# Patient Record
Sex: Male | Born: 1960 | Race: White | Hispanic: No | Marital: Single | Smoking: Never smoker
Health system: Southern US, Community
[De-identification: ages and names within clinical notes are randomized; demographics above are authoritative.]

---

## 2020-09-02 ENCOUNTER — Inpatient Hospital Stay (HOSPITAL_COMMUNITY)
Admission: EM | Admit: 2020-09-02 | Discharge: 2020-10-31 | DRG: 003 | Disposition: E | Payer: HRSA Program | Attending: Critical Care Medicine | Admitting: Critical Care Medicine

## 2020-09-02 ENCOUNTER — Emergency Department (HOSPITAL_COMMUNITY): Payer: HRSA Program

## 2020-09-02 ENCOUNTER — Ambulatory Visit (HOSPITAL_COMMUNITY)
Admission: EM | Admit: 2020-09-02 | Discharge: 2020-09-02 | Disposition: A | Discharge status: Nursing Home (Includes ICF) | Payer: Self-pay

## 2020-09-02 ENCOUNTER — Encounter (HOSPITAL_COMMUNITY): Payer: Self-pay

## 2020-09-02 ENCOUNTER — Other Ambulatory Visit: Payer: Self-pay

## 2020-09-02 DIAGNOSIS — M7989 Other specified soft tissue disorders: Secondary | ICD-10-CM | POA: Diagnosis not present

## 2020-09-02 DIAGNOSIS — E039 Hypothyroidism, unspecified: Secondary | ICD-10-CM | POA: Diagnosis present

## 2020-09-02 DIAGNOSIS — J9602 Acute respiratory failure with hypercapnia: Secondary | ICD-10-CM | POA: Diagnosis not present

## 2020-09-02 DIAGNOSIS — Z931 Gastrostomy status: Secondary | ICD-10-CM

## 2020-09-02 DIAGNOSIS — Z9911 Dependence on respirator [ventilator] status: Secondary | ICD-10-CM | POA: Diagnosis not present

## 2020-09-02 DIAGNOSIS — R1110 Vomiting, unspecified: Secondary | ICD-10-CM

## 2020-09-02 DIAGNOSIS — D594 Other nonautoimmune hemolytic anemias: Secondary | ICD-10-CM | POA: Diagnosis present

## 2020-09-02 DIAGNOSIS — Z452 Encounter for adjustment and management of vascular access device: Secondary | ICD-10-CM

## 2020-09-02 DIAGNOSIS — J9601 Acute respiratory failure with hypoxia: Secondary | ICD-10-CM

## 2020-09-02 DIAGNOSIS — E8770 Fluid overload, unspecified: Secondary | ICD-10-CM | POA: Diagnosis not present

## 2020-09-02 DIAGNOSIS — E785 Hyperlipidemia, unspecified: Secondary | ICD-10-CM | POA: Diagnosis present

## 2020-09-02 DIAGNOSIS — K922 Gastrointestinal hemorrhage, unspecified: Secondary | ICD-10-CM | POA: Diagnosis not present

## 2020-09-02 DIAGNOSIS — Z9281 Personal history of extracorporeal membrane oxygenation (ECMO): Secondary | ICD-10-CM

## 2020-09-02 DIAGNOSIS — I82463 Acute embolism and thrombosis of calf muscular vein, bilateral: Secondary | ICD-10-CM | POA: Diagnosis not present

## 2020-09-02 DIAGNOSIS — R579 Shock, unspecified: Secondary | ICD-10-CM | POA: Diagnosis not present

## 2020-09-02 DIAGNOSIS — R0989 Other specified symptoms and signs involving the circulatory and respiratory systems: Secondary | ICD-10-CM | POA: Diagnosis not present

## 2020-09-02 DIAGNOSIS — Z515 Encounter for palliative care: Secondary | ICD-10-CM | POA: Diagnosis not present

## 2020-09-02 DIAGNOSIS — R609 Edema, unspecified: Secondary | ICD-10-CM

## 2020-09-02 DIAGNOSIS — T380X5A Adverse effect of glucocorticoids and synthetic analogues, initial encounter: Secondary | ICD-10-CM | POA: Diagnosis not present

## 2020-09-02 DIAGNOSIS — Z79890 Hormone replacement therapy (postmenopausal): Secondary | ICD-10-CM | POA: Diagnosis not present

## 2020-09-02 DIAGNOSIS — M6281 Muscle weakness (generalized): Secondary | ICD-10-CM | POA: Diagnosis present

## 2020-09-02 DIAGNOSIS — Y832 Surgical operation with anastomosis, bypass or graft as the cause of abnormal reaction of the patient, or of later complication, without mention of misadventure at the time of the procedure: Secondary | ICD-10-CM | POA: Diagnosis not present

## 2020-09-02 DIAGNOSIS — R4182 Altered mental status, unspecified: Secondary | ICD-10-CM | POA: Diagnosis not present

## 2020-09-02 DIAGNOSIS — K567 Ileus, unspecified: Secondary | ICD-10-CM | POA: Diagnosis not present

## 2020-09-02 DIAGNOSIS — Z791 Long term (current) use of non-steroidal anti-inflammatories (NSAID): Secondary | ICD-10-CM

## 2020-09-02 DIAGNOSIS — J0410 Acute tracheitis without obstruction: Secondary | ICD-10-CM | POA: Diagnosis not present

## 2020-09-02 DIAGNOSIS — R0689 Other abnormalities of breathing: Secondary | ICD-10-CM

## 2020-09-02 DIAGNOSIS — R410 Disorientation, unspecified: Secondary | ICD-10-CM | POA: Diagnosis not present

## 2020-09-02 DIAGNOSIS — Z66 Do not resuscitate: Secondary | ICD-10-CM | POA: Diagnosis present

## 2020-09-02 DIAGNOSIS — G9341 Metabolic encephalopathy: Secondary | ICD-10-CM | POA: Diagnosis not present

## 2020-09-02 DIAGNOSIS — I9589 Other hypotension: Secondary | ICD-10-CM | POA: Diagnosis not present

## 2020-09-02 DIAGNOSIS — Y95 Nosocomial condition: Secondary | ICD-10-CM | POA: Diagnosis present

## 2020-09-02 DIAGNOSIS — J80 Acute respiratory distress syndrome: Secondary | ICD-10-CM | POA: Diagnosis present

## 2020-09-02 DIAGNOSIS — J9690 Respiratory failure, unspecified, unspecified whether with hypoxia or hypercapnia: Secondary | ICD-10-CM

## 2020-09-02 DIAGNOSIS — J9600 Acute respiratory failure, unspecified whether with hypoxia or hypercapnia: Secondary | ICD-10-CM

## 2020-09-02 DIAGNOSIS — I82441 Acute embolism and thrombosis of right tibial vein: Secondary | ICD-10-CM | POA: Diagnosis not present

## 2020-09-02 DIAGNOSIS — E870 Hyperosmolality and hypernatremia: Secondary | ICD-10-CM | POA: Diagnosis not present

## 2020-09-02 DIAGNOSIS — R1310 Dysphagia, unspecified: Secondary | ICD-10-CM | POA: Diagnosis not present

## 2020-09-02 DIAGNOSIS — U071 COVID-19: Secondary | ICD-10-CM | POA: Diagnosis present

## 2020-09-02 DIAGNOSIS — R7401 Elevation of levels of liver transaminase levels: Secondary | ICD-10-CM | POA: Diagnosis not present

## 2020-09-02 DIAGNOSIS — Z9289 Personal history of other medical treatment: Secondary | ICD-10-CM

## 2020-09-02 DIAGNOSIS — Z7189 Other specified counseling: Secondary | ICD-10-CM

## 2020-09-02 DIAGNOSIS — D649 Anemia, unspecified: Secondary | ICD-10-CM | POA: Diagnosis not present

## 2020-09-02 DIAGNOSIS — I10 Essential (primary) hypertension: Secondary | ICD-10-CM | POA: Diagnosis present

## 2020-09-02 DIAGNOSIS — J9501 Hemorrhage from tracheostomy stoma: Secondary | ICD-10-CM | POA: Diagnosis not present

## 2020-09-02 DIAGNOSIS — E875 Hyperkalemia: Secondary | ICD-10-CM | POA: Diagnosis not present

## 2020-09-02 DIAGNOSIS — Z4659 Encounter for fitting and adjustment of other gastrointestinal appliance and device: Secondary | ICD-10-CM

## 2020-09-02 DIAGNOSIS — Z978 Presence of other specified devices: Secondary | ICD-10-CM

## 2020-09-02 DIAGNOSIS — J982 Interstitial emphysema: Secondary | ICD-10-CM | POA: Diagnosis not present

## 2020-09-02 DIAGNOSIS — E669 Obesity, unspecified: Secondary | ICD-10-CM | POA: Diagnosis present

## 2020-09-02 DIAGNOSIS — N170 Acute kidney failure with tubular necrosis: Secondary | ICD-10-CM | POA: Diagnosis not present

## 2020-09-02 DIAGNOSIS — J15212 Pneumonia due to Methicillin resistant Staphylococcus aureus: Secondary | ICD-10-CM | POA: Diagnosis not present

## 2020-09-02 DIAGNOSIS — R339 Retention of urine, unspecified: Secondary | ICD-10-CM | POA: Diagnosis not present

## 2020-09-02 DIAGNOSIS — I609 Nontraumatic subarachnoid hemorrhage, unspecified: Secondary | ICD-10-CM | POA: Diagnosis not present

## 2020-09-02 DIAGNOSIS — G9340 Encephalopathy, unspecified: Secondary | ICD-10-CM | POA: Diagnosis not present

## 2020-09-02 DIAGNOSIS — J1282 Pneumonia due to coronavirus disease 2019: Secondary | ICD-10-CM

## 2020-09-02 DIAGNOSIS — T502X5A Adverse effect of carbonic-anhydrase inhibitors, benzothiadiazides and other diuretics, initial encounter: Secondary | ICD-10-CM | POA: Diagnosis not present

## 2020-09-02 DIAGNOSIS — R042 Hemoptysis: Secondary | ICD-10-CM | POA: Diagnosis present

## 2020-09-02 DIAGNOSIS — D599 Acquired hemolytic anemia, unspecified: Secondary | ICD-10-CM | POA: Diagnosis not present

## 2020-09-02 DIAGNOSIS — K626 Ulcer of anus and rectum: Secondary | ICD-10-CM | POA: Diagnosis not present

## 2020-09-02 DIAGNOSIS — R451 Restlessness and agitation: Secondary | ICD-10-CM | POA: Diagnosis not present

## 2020-09-02 DIAGNOSIS — D689 Coagulation defect, unspecified: Secondary | ICD-10-CM | POA: Diagnosis not present

## 2020-09-02 DIAGNOSIS — I82453 Acute embolism and thrombosis of peroneal vein, bilateral: Secondary | ICD-10-CM | POA: Diagnosis not present

## 2020-09-02 DIAGNOSIS — Z789 Other specified health status: Secondary | ICD-10-CM | POA: Diagnosis not present

## 2020-09-02 DIAGNOSIS — Z6839 Body mass index (BMI) 39.0-39.9, adult: Secondary | ICD-10-CM

## 2020-09-02 DIAGNOSIS — J189 Pneumonia, unspecified organism: Secondary | ICD-10-CM | POA: Diagnosis not present

## 2020-09-02 DIAGNOSIS — Z01810 Encounter for preprocedural cardiovascular examination: Secondary | ICD-10-CM | POA: Diagnosis not present

## 2020-09-02 DIAGNOSIS — L8990 Pressure ulcer of unspecified site, unspecified stage: Secondary | ICD-10-CM | POA: Insufficient documentation

## 2020-09-02 DIAGNOSIS — R739 Hyperglycemia, unspecified: Secondary | ICD-10-CM | POA: Diagnosis not present

## 2020-09-02 DIAGNOSIS — R571 Hypovolemic shock: Secondary | ICD-10-CM | POA: Diagnosis present

## 2020-09-02 DIAGNOSIS — I428 Other cardiomyopathies: Secondary | ICD-10-CM | POA: Diagnosis not present

## 2020-09-02 DIAGNOSIS — Z7982 Long term (current) use of aspirin: Secondary | ICD-10-CM

## 2020-09-02 DIAGNOSIS — E872 Acidosis: Secondary | ICD-10-CM | POA: Diagnosis not present

## 2020-09-02 DIAGNOSIS — R0902 Hypoxemia: Secondary | ICD-10-CM

## 2020-09-02 DIAGNOSIS — E1165 Type 2 diabetes mellitus with hyperglycemia: Secondary | ICD-10-CM | POA: Diagnosis present

## 2020-09-02 DIAGNOSIS — E878 Other disorders of electrolyte and fluid balance, not elsewhere classified: Secondary | ICD-10-CM | POA: Diagnosis not present

## 2020-09-02 DIAGNOSIS — E861 Hypovolemia: Secondary | ICD-10-CM | POA: Diagnosis present

## 2020-09-02 DIAGNOSIS — E871 Hypo-osmolality and hyponatremia: Secondary | ICD-10-CM | POA: Diagnosis present

## 2020-09-02 DIAGNOSIS — R092 Respiratory arrest: Secondary | ICD-10-CM

## 2020-09-02 DIAGNOSIS — R7989 Other specified abnormal findings of blood chemistry: Secondary | ICD-10-CM | POA: Diagnosis not present

## 2020-09-02 DIAGNOSIS — Z79899 Other long term (current) drug therapy: Secondary | ICD-10-CM

## 2020-09-02 DIAGNOSIS — D6959 Other secondary thrombocytopenia: Secondary | ICD-10-CM | POA: Diagnosis present

## 2020-09-02 DIAGNOSIS — N179 Acute kidney failure, unspecified: Secondary | ICD-10-CM | POA: Diagnosis not present

## 2020-09-02 DIAGNOSIS — D696 Thrombocytopenia, unspecified: Secondary | ICD-10-CM | POA: Diagnosis not present

## 2020-09-02 LAB — COMPREHENSIVE METABOLIC PANEL
ALT: 44 U/L (ref 0–44)
AST: 68 U/L — ABNORMAL HIGH (ref 15–41)
Albumin: 3.3 g/dL — ABNORMAL LOW (ref 3.5–5.0)
Alkaline Phosphatase: 118 U/L (ref 38–126)
Anion gap: 10 (ref 5–15)
BUN: 14 mg/dL (ref 6–20)
CO2: 24 mmol/L (ref 22–32)
Calcium: 8.6 mg/dL — ABNORMAL LOW (ref 8.9–10.3)
Chloride: 99 mmol/L (ref 98–111)
Creatinine, Ser: 1.05 mg/dL (ref 0.61–1.24)
GFR, Estimated: >60 mL/min (ref >60)
Glucose, Bld: 107 mg/dL — ABNORMAL HIGH (ref 70–99)
Potassium: 4.4 mmol/L (ref 3.5–5.1)
Sodium: 133 mmol/L — ABNORMAL LOW (ref 135–145)
Total Bilirubin: 0.9 mg/dL (ref 0.3–1.2)
Total Protein: 6.5 g/dL (ref 6.5–8.1)

## 2020-09-02 LAB — CBC WITH DIFFERENTIAL/PLATELET
Abs Immature Granulocytes: 0.11 10*3/uL — ABNORMAL HIGH (ref 0.00–0.07)
Basophils Absolute: 0 10*3/uL (ref 0.0–0.1)
Basophils Relative: 0 %
Eosinophils Absolute: 0 10*3/uL (ref 0.0–0.5)
Eosinophils Relative: 0 %
HCT: 44.4 % (ref 39.0–52.0)
Hemoglobin: 15.2 g/dL (ref 13.0–17.0)
Immature Granulocytes: 1 %
Lymphocytes Relative: 16 %
Lymphs Abs: 1.3 10*3/uL (ref 0.7–4.0)
MCH: 31.3 pg (ref 26.0–34.0)
MCHC: 34.2 g/dL (ref 30.0–36.0)
MCV: 91.5 fL (ref 80.0–100.0)
Monocytes Absolute: 0.6 10*3/uL (ref 0.1–1.0)
Monocytes Relative: 7 %
Neutro Abs: 6.1 10*3/uL (ref 1.7–7.7)
Neutrophils Relative %: 76 %
Platelets: 139 10*3/uL — ABNORMAL LOW (ref 150–400)
RBC: 4.85 MIL/uL (ref 4.22–5.81)
RDW: 12.5 % (ref 11.5–15.5)
WBC: 8.1 10*3/uL (ref 4.0–10.5)
nRBC: 0 % (ref 0.0–0.2)

## 2020-09-02 LAB — RESP PANEL BY RT-PCR (FLU A&B, COVID) ARPGX2
Influenza A by PCR: NEGATIVE
Influenza B by PCR: NEGATIVE
SARS Coronavirus 2 by RT PCR: POSITIVE — AB

## 2020-09-02 LAB — C-REACTIVE PROTEIN: CRP: 8.3 mg/dL — ABNORMAL HIGH (ref <1.0)

## 2020-09-02 LAB — PROCALCITONIN: Procalcitonin: 0.18 ng/mL

## 2020-09-02 LAB — TROPONIN I (HIGH SENSITIVITY)
Troponin I (High Sensitivity): 12 ng/L (ref <18)
Troponin I (High Sensitivity): 12 ng/L (ref <18)

## 2020-09-02 LAB — LACTIC ACID, PLASMA
Lactic Acid, Venous: 1.5 mmol/L (ref 0.5–1.9)
Lactic Acid, Venous: 2.2 mmol/L (ref 0.5–1.9)

## 2020-09-02 LAB — LACTATE DEHYDROGENASE: LDH: 525 U/L — ABNORMAL HIGH (ref 98–192)

## 2020-09-02 LAB — FERRITIN: Ferritin: 902 ng/mL — ABNORMAL HIGH (ref 24–336)

## 2020-09-02 LAB — FIBRINOGEN: Fibrinogen: 556 mg/dL — ABNORMAL HIGH (ref 210–475)

## 2020-09-02 LAB — TRIGLYCERIDES: Triglycerides: 101 mg/dL (ref <150)

## 2020-09-02 LAB — D-DIMER, QUANTITATIVE: D-Dimer, Quant: 1.55 ug/mL-FEU — ABNORMAL HIGH (ref 0.00–0.50)

## 2020-09-02 MED ORDER — DEXAMETHASONE 2 MG PO TABS: 6.0000 mg | ORAL_TABLET | ORAL | Status: DC

## 2020-09-02 MED ORDER — ONDANSETRON HCL 4 MG/2ML IJ SOLN: 4.0000 mg | Freq: Four times a day (QID) | INTRAMUSCULAR | Status: DC | PRN

## 2020-09-02 MED ORDER — LACTATED RINGERS IV SOLN: INTRAVENOUS | Status: DC

## 2020-09-02 MED ORDER — ENOXAPARIN SODIUM 40 MG/0.4ML ~~LOC~~ SOLN
40.0000 mg | SUBCUTANEOUS | Status: DC
  Administered 2020-09-02 – 2020-09-04 (×3): 40 mg via SUBCUTANEOUS
  Filled 2020-09-02 (×3): qty 0.4

## 2020-09-02 MED ORDER — ONDANSETRON HCL 4 MG PO TABS: 4.0000 mg | ORAL_TABLET | Freq: Four times a day (QID) | ORAL | Status: DC | PRN

## 2020-09-02 MED ORDER — SODIUM CHLORIDE 0.9 % IV SOLN
500.0000 mg | INTRAVENOUS | Status: DC
  Administered 2020-09-03 – 2020-09-04 (×2): 500 mg via INTRAVENOUS
  Filled 2020-09-02 (×2): qty 500

## 2020-09-02 MED ORDER — SALINE SPRAY 0.65 % NA SOLN
1.0000 | NASAL | Status: DC
  Administered 2020-09-02: 2 via NASAL
  Administered 2020-09-03 (×2): 1 via NASAL
  Administered 2020-09-03: 2 via NASAL
  Administered 2020-09-03: 1 via NASAL
  Administered 2020-09-03: 2 via NASAL
  Administered 2020-09-03: 1 via NASAL
  Administered 2020-09-03 – 2020-09-04 (×6): 2 via NASAL
  Administered 2020-09-04: 1 via NASAL
  Administered 2020-09-04: 2 via NASAL
  Administered 2020-09-04 – 2020-09-05 (×2): 1 via NASAL
  Filled 2020-09-02: qty 44

## 2020-09-02 MED ORDER — SODIUM CHLORIDE 0.9 % IV SOLN
1.0000 g | INTRAVENOUS | Status: DC
  Administered 2020-09-03 – 2020-09-04 (×2): 1 g via INTRAVENOUS
  Filled 2020-09-02 (×2): qty 10

## 2020-09-02 MED ORDER — ACETAMINOPHEN 325 MG PO TABS
650.0000 mg | ORAL_TABLET | Freq: Once | ORAL | Status: AC
  Administered 2020-09-02: 650 mg via ORAL
  Filled 2020-09-02: qty 2

## 2020-09-02 MED ORDER — DEXAMETHASONE SODIUM PHOSPHATE 10 MG/ML IJ SOLN
10.0000 mg | Freq: Once | INTRAMUSCULAR | Status: AC
  Administered 2020-09-02: 10 mg via INTRAVENOUS
  Filled 2020-09-02: qty 1

## 2020-09-02 MED ORDER — SODIUM CHLORIDE 0.9 % IV SOLN
100.0000 mg | Freq: Every day | INTRAVENOUS | Status: AC
  Administered 2020-09-03 – 2020-09-06 (×4): 100 mg via INTRAVENOUS
  Filled 2020-09-02 (×6): qty 20

## 2020-09-02 MED ORDER — IOHEXOL 350 MG/ML SOLN
100.0000 mL | Freq: Once | INTRAVENOUS | Status: AC | PRN
  Administered 2020-09-02: 100 mL via INTRAVENOUS

## 2020-09-02 MED ORDER — SODIUM CHLORIDE 0.9 % IV BOLUS
1000.0000 mL | Freq: Once | INTRAVENOUS | Status: AC
  Administered 2020-09-02: 1000 mL via INTRAVENOUS

## 2020-09-02 MED ORDER — POLYETHYLENE GLYCOL 3350 17 G PO PACK: 17.0000 g | PACK | Freq: Every day | ORAL | Status: DC | PRN

## 2020-09-02 MED ORDER — ACETAMINOPHEN 325 MG PO TABS
650.0000 mg | ORAL_TABLET | Freq: Four times a day (QID) | ORAL | Status: DC | PRN
  Administered 2020-09-03: 650 mg via ORAL
  Filled 2020-09-02: qty 2

## 2020-09-02 MED ORDER — SODIUM CHLORIDE 0.9 % IV SOLN
200.0000 mg | Freq: Once | INTRAVENOUS | Status: AC
  Administered 2020-09-02: 200 mg via INTRAVENOUS
  Filled 2020-09-02: qty 40

## 2020-09-02 MED ORDER — ONDANSETRON HCL 4 MG/2ML IJ SOLN
4.0000 mg | Freq: Once | INTRAMUSCULAR | Status: AC
  Administered 2020-09-02: 4 mg via INTRAVENOUS
  Filled 2020-09-02: qty 2

## 2020-09-02 NOTE — H&P (Signed)
History and Physical    Donald Rowe MVH:[id number] DOB: 12-31-1960 DOA: 09/06/2020  PCP: Venetia Constable, MD  Patient coming from: home Chief Complaint: COVID and sob  HPI: Donald Rowe is a 59 y.o. male with a pertinent history of hypothyroidism on levothyroxine and hyperlipidemia who was diagnosed with Covid 1 week ago and is not vaccinated.  With EMS he was seen to be D satting to 78% on room air.  He states he started having symptoms on Friday and tested positive on Saturday.  He was having some sniffles earlier on in the week and just the past day or so has developed shortness of breath, associated symptoms include hemoptysis and was having some calf tenderness.  In the ED patient was increased to 6 L oxygen satting about 92%, WBC 8.1, Hgb 15.2, PLT 139, lactic acid 1.5, D-dimer 1.55, procalcitonin 0.18, ferritin 902, triglycerides 101, fibrinogen 556, CRP 8.3, troponin 12, LDH 525, NA 133, K4.4, CA 8.6, albumin 3.3, AST 68, Covid again was positive.  In the emergency department he was started on remdesivir and given dexamethasone DVT US was negative and CT scan was negative for PE but showed other findings.  Review of Systems: As per HPI otherwise 10 point review of systems negative.  Other pertinents as below: difficult to get full ROS because of his mask General - denies any headaches or visual changes, has a fever and chill HEENT - denies any new HA's or visual changes Cardio - denies any CP or palpitations Resp - feels sob, has a cough  GI - denies n/v/d/GI pain, h/m GU - denies any urinary changes MSK - denies any joint or back pain Skin -  Neuro -  Psych - he doesn't thin khe is depressed or anxious  H/o hypothyroidism History reviewed. No pertinent past medical history.  History reviewed. No pertinent surgical history.   reports that he has never smoked. He has never used smokeless tobacco. No history on file for alcohol use and drug use.  No Known  Allergies  History reviewed. No pertinent family history.   Prior to Admission medications   Medication Sig Start Date End Date Taking? Authorizing Provider  LIVALO 4 MG TABS Take 0.5 tablets by mouth daily. 07/24/20  Yes [provider]  levothyroxine (SYNTHROID) 50 MCG tablet Take 50 mcg by mouth daily. 06/30/20   [provider]  meloxicam (MOBIC) 15 MG tablet Take 15 mg by mouth daily. 06/27/20   [provider]    Physical Exam: Vitals:   09/10/2020 1300 09/03/2020 1330 08/30/2020 1400 09/14/2020 1517  BP: (!) 115/56 (!) 115/56 (!) 105/55 124/75  Pulse: (!) 110 (!) 110 (!) 102 (!) 103  Resp:  Temp:      TempSrc:      SpO2: 93% 93% 93% 92%    Constitutional: NAD, somewhat uncomfortable Eyes: pupils equal and reactive to light, anicteric, without injection ENMT: MMM, throat without exudates or erythema Neck: normal, supple, no masses, no thyromegaly noted Respiratory: coarse rales diffusely Cardiovascular: rrr w/o mrg, warm extremities Abdomen: NBS, NT,   Musculoskeletal: moving all 4 extremities,  Skin: no rashes, lesions, ulcers. No induration Neurologic: CN 2-12 grossly intact. Sensation intact Psychiatric: AO appearing, mentation appropriate  Labs on Admission: I have personally reviewed following labs and imaging studies  CBC: Recent Labs  Lab 09/01/2020 1242  WBC 8.1  NEUTROABS 6.1  HGB 15.2  HCT 44.4  MCV 91.5  PLT 139*   Basic Metabolic  Panel: Recent Labs  Lab 09/23/2020 1242  NA 133*  K 4.4  CL 99  CO2 24  GLUCOSE 107*  BUN 14  CREATININE 1.05  CALCIUM 8.6*   GFR: CrCl cannot be calculated (Unknown ideal weight.). Liver Function Tests: Recent Labs  Lab 09/18/2020 1242  AST 68*  ALT 44  ALKPHOS 118  BILITOT 0.9  PROT 6.5  ALBUMIN 3.3*   No results for input(s): LIPASE, AMYLASE in the last 168 hours. No results for input(s): AMMONIA in the last 168 hours. Coagulation Profile: No results for input(s): INR,  PROTIME in the last 168 hours. Cardiac Enzymes: No results for input(s): CKTOTAL, CKMB, CKMBINDEX, TROPONINI in the last 168 hours. BNP (last 3 results) No results for input(s): PROBNP in the last 8760 hours. HbA1C: No results for input(s): HGBA1C in the last 72 hours. CBG: No results for input(s): GLUCAP in the last 168 hours. Lipid Profile: Recent Labs    09/29/2020 1242  TRIG 101   Thyroid Function Tests: No results for input(s): TSH, T4TOTAL, FREET4, T3FREE, THYROIDAB in the last 72 hours. Anemia Panel: Recent Labs    09/17/2020 1242  FERRITIN 902*   Urine analysis: No results found for: COLORURINE, APPEARANCEUR, LABSPEC, PHURINE, GLUCOSEU, HGBUR, BILIRUBINUR, KETONESUR, PROTEINUR, UROBILINOGEN, NITRITE, LEUKOCYTESUR  Radiological Exams on Admission: CT Angio Chest PE W/Cm &/Or Wo Cm  Result Date: 08/30/2020 CLINICAL DATA:  59 year old with shortness of breath and COVID-19 positive. EXAM: CT ANGIOGRAPHY CHEST WITH CONTRAST TECHNIQUE: Multidetector CT imaging of the chest was performed using the standard protocol during bolus administration of intravenous contrast. Multiplanar CT image reconstructions and MIPs were obtained to evaluate the vascular anatomy. CONTRAST:  OMNIPAQUE IOHEXOL 350 MG/ML SOLN COMPARISON:  Chest radiograph 09/05/2020 FINDINGS: Cardiovascular: Main pulmonary arteries are patent. Unfortunately, limited evaluation of the pulmonary arteries beyond the main pulmonary arteries due to extensive motion artifact and surrounding parenchymal lung disease. Heart size is normal. No significant pericardial effusion. There are coronary artery calcifications. Mediastinum/Nodes: Small lymph nodes in the mediastinum but no significant chest lymphadenopathy. No axillary lymph node enlargement. Lungs/Pleura: Trachea and mainstem bronchi are patent. Extensive airspace disease and consolidation throughout both lungs and involving all of the lobes. Parenchymal disease is slightly  patchy. No significant pleural fluid. Upper Abdomen: Images of the upper abdomen are unremarkable. Musculoskeletal: No acute abnormality. Review of the MIP images confirms the above findings. IMPRESSION: 1. Extensive airspace disease involving both lungs. Findings are compatible with bilateral pneumonia and likely related to COVID-19. 2. Limited evaluation for pulmonary emboli due to technical limitations. No evidence for a large pulmonary embolism involving the main pulmonary arteries as described. Electronically Signed   By: Richarda Overlie M.D.   On: 09/17/2020 14:57   DG Chest Port 1 View  Result Date: 09/01/2020 CLINICAL DATA:  Low oxygen saturation. Body aches and generalized weakness with shortness of breath for 1 week. Positive COVID-19 test on 08/26/2020. EXAM: PORTABLE CHEST 1 VIEW COMPARISON:  None. FINDINGS: There are bilateral hazy airspace lung opacities, in the mid and lower lungs, consistent with multifocal COVID-19 pneumonia. No convincing pleural effusion.  No pneumothorax. Cardiac silhouette is normal in size. No mediastinal or hilar masses. Skeletal structures are grossly intact. IMPRESSION: 1. Bilateral airspace lung opacities consistent with multifocal COVID-19 pneumonia. Electronically Signed   By: Amie Portland M.D.   On: 09/08/2020 12:24   VAS [place] LOWER EXTREMITY VENOUS (DVT) (ONLY [facility] & [facility])  Result Date: 09/22/2020  Lower Venous DVT Study Indications: COVID pneumonia,  dyspnea, edema.  Comparison Study: No prior study Performing Technologist: Gertie Fey MHA, RDMS, RVT, RDCS  Examination Guidelines: A complete evaluation includes B-mode imaging, spectral Doppler, color Doppler, and power Doppler as needed of all accessible portions of each vessel. Bilateral testing is considered an integral part of a complete examination. Limited examinations for reoccurring indications may be performed as noted. The reflux portion of the exam is performed with the patient in reverse Trendelenburg.   +---------+---------------+---------+-----------+----------+--------------+ RIGHT    CompressibilityPhasicitySpontaneityPropertiesThrombus Aging +---------+---------------+---------+-----------+----------+--------------+ CFV      Full           Yes      Yes                                 +---------+---------------+---------+-----------+----------+--------------+ SFJ      Full                                                        +---------+---------------+---------+-----------+----------+--------------+ FV Prox  Full                                                        +---------+---------------+---------+-----------+----------+--------------+ FV Mid   Full                                                        +---------+---------------+---------+-----------+----------+--------------+ FV DistalFull                                                        +---------+---------------+---------+-----------+----------+--------------+ PFV      Full                                                        +---------+---------------+---------+-----------+----------+--------------+ POP      Full           Yes      Yes                                 +---------+---------------+---------+-----------+----------+--------------+ PTV      Full                                                        +---------+---------------+---------+-----------+----------+--------------+ PERO     Full                                                        +---------+---------------+---------+-----------+----------+--------------+  Right Technical Findings: Not visualized segments include Limited evaluation of calf veins.  +---------+---------------+---------+-----------+----------+--------------+ LEFT     CompressibilityPhasicitySpontaneityPropertiesThrombus Aging +---------+---------------+---------+-----------+----------+--------------+ CFV      Full           Yes       Yes                                 +---------+---------------+---------+-----------+----------+--------------+ SFJ      Full                                                        +---------+---------------+---------+-----------+----------+--------------+ FV Prox  Full                                                        +---------+---------------+---------+-----------+----------+--------------+ FV Mid   Full                                                        +---------+---------------+---------+-----------+----------+--------------+ FV DistalFull                                                        +---------+---------------+---------+-----------+----------+--------------+ PFV      Full                                                        +---------+---------------+---------+-----------+----------+--------------+ POP      Full           Yes      Yes                                 +---------+---------------+---------+-----------+----------+--------------+ PTV      Full                                                        +---------+---------------+---------+-----------+----------+--------------+ PERO     Full                                                        +---------+---------------+---------+-----------+----------+--------------+   Left Technical Findings: Not visualized segments include Limited evaluation of calf veins.   Summary: RIGHT: - There is no evidence of deep vein thrombosis in the lower extremity.  -  No cystic structure found in the popliteal fossa.  LEFT: - There is no evidence of deep vein thrombosis in the lower extremity.  - No cystic structure found in the popliteal fossa.  *See table(s) above for measurements and observations.    Preliminary     EKG: not able to review EKG yet  Assessment/Plan Active Problems:   Acute hypoxemic respiratory failure due to COVID-19 (HCC) HLD, Hypothyroidism  Datron Brakebill  is a 59 y.o. male with a pertinent history of hypothyroidism on levothyroxine and hyperlipidemia who was diagnosed with Covid 1 week ago and is not vaccinated who presents with hypoxic respiratory failure  Acute respiratory failure with hypoxia due to COVID-19 PNA, worsening, concerning prognosis -continue to monitor LFTs on remdesivir -Trend labs --Follow-up blood cultures -LR 75 mL/h for 12 hours, defer continuation to the day team. --repeat CTa consideration, consider full anticoagulation treatment --if decompensates more would consider treating with Tociluzumab --plan of care note from 11:00 pm  -Patient with presenting with SOB and reported fever at home; also with cough productive of clear sputum -Mild anorexia noted without the presence of other GI symptoms -She does not have a usual home O2 requirement and is currently requiring 6L [place] O2 -COVID POSITIVE -Exam is concerning for development of ARDS/MODS due to respiratory distress, circulatory shock, renal failure -Pertinent labs concerning for COVID include leukopenia/lymphopenia; increased BUN/Creatinine; increased LFTs; increased LDH; markedly elevated D-dimer (>1); increased ferritin; low procalcitonin; markedly elevated CRP (>7); increased fibrinogen -CXR with multifocal opacities which may be c/w COVID vs. Multifocal PNA; CT chest typical findings for COVID -Will not treat with broad-spectrum antibiotics given procalcitonin <0.5 -Will admit for further evaluation, close monitoring, and treatment -Monitor on telemetry x at least 24 hours -At this time, will attempt to avoid use of aerosolized medications and use HFAs instead -Will check daily labs including BMP with Mag, Phos; LFTs; CBC with differential; CRP; ferritin; fibrinogen; D-dimer -Will order steroids and Remdesivir (pharmacy consult) given +COVID test, +CXR, and hypoxia <94% on room air -If the patient shows clinical deterioration, consider transfer to ICU with PCCM  consultation -Consider Tocilizumab and/or convalescent plasma if the patient does not stabilize on current treatment or if the patient has marked clinical decompensation; the patient does not appear to require these treatments at this time. -Will attempt to maintain euvolemia to a net negative fluid status -Will ask the patient to maintain an awake prone position for 16+ hours a day, if possible, with a minimum of 2-3 hours at a time -With D-dimer <5, will use standard-dosed Lovenox for DVT prevention -Patient was seen wearing full PPE including: gown, gloves, head cover, N95, and face shield; donning and doffing was in compliance with current standards.  Thrombocytopenia-will likely improve with treatment and fluids continue to monitor  Hyponatremia-will likely improve with treatment and fluids, continue to monitor Hypocalcemia-continue to monitor in the outpatient setting  Chronic comorbidities HLD-cont equivalent of statin Hypothyroidism- continue thyroid supplement  DVT prophylaxis:  Lovenox  Code Status:  Full - confirmed with patient Family Communication: None present; I spoke with the patient's daughter by telephone. Disposition Plan:  The patient is from: home  Anticipated d/c is to: home without [facility] services once her respiratory issues have been resolved.  She may require home O2 at the time of discharge.  Anticipated d/c date will depend on clinical response to treatment, likely between 3 days (with completion of outpatient Remdesivir treatment) and 5 days  Patient is currently: acutely ill Consults called:  None  Admission status: Admit - It is my clinical opinion that admission to INPATIENT is reasonable and necessary because of the expectation that this patient will require hospital care that crosses at least 2 midnights to treat this condition based on the medical complexity of the problems presented.  Given the aforementioned information, the predictability of an adverse outcome  is felt to be significant.    A total of 70 minutes utilized during this admission.  CRITICAL CARE Performed by: Charlane Ferretti   Acute hypoxic respiratory failure, worsening. Total critical care time: 30 minutes.  Talking with Respiratory therapy anursing staff to talk about proning and plan ahead, goals of care conversation with patient.    Critical care time was exclusive of separately billable procedures and treating other patients.  Critical care was necessary to treat or prevent imminent or life-threatening deterioration.  Critical care was time spent personally by me on the following activities: development of treatment plan with patient and/or surrogate as well as nursing, discussions with consultants, evaluation of patient's response to treatment, examination of patient, obtaining history from patient or surrogate, ordering and performing treatments and interventions, ordering and review of laboratory studies, ordering and review of radiographic studies, pulse oximetry and re-evaluation of patient's condition.  Charlane Ferretti DO [facility]   If 7PM-7AM, please contact night-coverage [email or web address] Password [facility]  Sep 09, 2020, 4:34 PM

## 2020-09-02 NOTE — ED Notes (Addendum)
EMS called, cardiac monitor and emergency requested.   Charge nurse called, reported pt will go by EMS, chest pain, SpO2 78% room air.

## 2020-09-02 NOTE — Progress Notes (Signed)
Bilateral lower extremity venous duplex completed. Refer to "CV Proc" under chart review to view preliminary results.  09/29/2020 4:10 PM Eula Fried., MHA, RVT, RDCS, RDMS

## 2020-09-02 NOTE — ED Triage Notes (Signed)
EMS reports from [facility], c/o fever and low O2 at UC, body aches generalized weakness and SOB x 1 week. Tested Covid Positive on 11/27, denies Covid Vaccines.  BP 128/83 HR 109 RR 16 Sp02 97 6ltr O2   20 L hand

## 2020-09-02 NOTE — ED Notes (Addendum)
Per [facility], pt being diverted to [facility] ED.  Report called to Tobi Bastos ED Charge RN.   Pt's family notified of tranport to [facility] ED.

## 2020-09-02 NOTE — ED Notes (Signed)
Patient is being discharged from the Urgent Care Center and sent to the Emergency Department via [facility]. Per K. Arlana Pouch, NP, patient is stable but in need of higher level of care due to Covid + with low oxygen saturation. Patient is aware and verbalizes understanding of plan of care.  Vitals:   21-Sep-2020 1057 09/21/20 1058  BP:    Pulse:  (!) 107  Resp:    Temp:    SpO2: 91% 96%

## 2020-09-02 NOTE — ED Triage Notes (Addendum)
Pt states diagnosed with Covid 1 wk ago; started with progressive SOB over past 3 days.  SaO2 = 78% RA; taken to triage and placed on 4L O2 with SaO2 up to 91%. K. Arlana Pouch, NP notified of pt.  [facility] notified of need for transport.

## 2020-09-02 NOTE — Progress Notes (Signed)
CRITICAL VALUE ALERT  Critical Value:  Lactic Acid 2.2  Date & Time Notied:  09/10/2020 2332  Provider Notified: Dr. Thornell Mule notified  Orders Received/Actions taken: new order received for ceftriaxone, will continue to monitor

## 2020-09-02 NOTE — ED Provider Notes (Signed)
[facility]-EMERGENCY DEPT Provider Note   CSN: [id number] Arrival date & time: 09/13/2020  1125     History Chief Complaint  Patient presents with  . Covid Positive  . Shortness of Breath  . Fever  . Generalized Body Aches  . Weakness    Donald Rowe is a 59 y.o. male with no significant past medical history who presents for evaluation of hypoxia.  Patient seen at urgent care earlier today with complaint of fever and shortness of breath.  He is not vaccinated for Covid.  Tested positive on 11/27 at urgent care in [facility].  He does not have a copy of his positive Covid test.  Was sent via EMS to the ED due to respiratory failure.  Patient states he is having persistent fevers at home.  He has chest pain, shortness of breath.  States he occasionally coughs up blood tinged sputum as well has had some bloody rhinorrhea.  States he feels short of breath with minimal movement.  States prior to his Covid diagnosis did not have any exertional shortness of breath.  Has had nausea.  Has not been able to keep any solid food intake down due to persistent nausea.  He denies any abdominal pain, diarrhea.  He feels very dehydrated.  Does feel he has pain to the posterior aspect of his bilateral calves.  No recent surgery or immobilization.  No prior history of PE or DVT.  Denies additional aggravating or alleviating factors  Patient hypoxic to 78% at urgent care.  Placed on 6 L via nasal cannula.  Has low-grade temperature as well as tachycardia and tachypnea.  History from patient and past medical records.  No interpreter used  HPI     History reviewed. No pertinent past medical history.  Patient Active Problem List   Diagnosis Date Noted  . Acute hypoxemic respiratory failure due to COVID-19 [facility]) 09/09/2020    History reviewed    History reviewed. No pertinent family history.  Social History   Tobacco Use  . Smoking status: Never Smoker  . Smokeless tobacco:  Never Used  Substance Use Topics  . Alcohol use: Not on file  . Drug use: Not on file    Home Medications Prior to Admission medications   Medication Sig Start Date End Date Taking? Authorizing Provider  LIVALO 4 MG TABS Take 0.5 tablets by mouth daily. 07/24/20  Yes [provider]  levothyroxine (SYNTHROID) 50 MCG tablet Take 50 mcg by mouth daily. 06/30/20   [provider]  meloxicam (MOBIC) 15 MG tablet Take 15 mg by mouth daily. 06/27/20   [provider]    Allergies    Patient has no known allergies.  Review of Systems   Review of Systems  Constitutional: Positive for activity change, appetite change, chills, fatigue and fever.  HENT: Positive for congestion and rhinorrhea.   Respiratory: Positive for cough.   Cardiovascular: Positive for chest pain. Negative for palpitations and leg swelling.  Gastrointestinal: Positive for nausea. Negative for abdominal distention, abdominal pain, anal bleeding, blood in stool, constipation, diarrhea, rectal pain and vomiting.  Genitourinary: Negative.   Musculoskeletal:       Bilateral posterior calf pain  Neurological: Positive for weakness.  All other systems reviewed and are negative.   Physical Exam Updated Vital Signs BP 124/75   Pulse (!) 103   Temp 100.1 F (37.8 C) (Oral)   Resp 20   SpO2 92%   Physical Exam Vitals and nursing note  reviewed.  Constitutional:      General: He is in acute distress.     Appearance: He is obese. He is ill-appearing and toxic-appearing. He is not diaphoretic.  HENT:     Head: Normocephalic and atraumatic.     Jaw: There is normal jaw occlusion.     Right Ear: Tympanic membrane, ear canal and external ear normal. There is no impacted cerumen. No hemotympanum. Tympanic membrane is not injected, scarred, perforated, erythematous, retracted or bulging.     Left Ear: Tympanic membrane, ear canal and external ear normal. There is no impacted cerumen. No hemotympanum.  Tympanic membrane is not injected, scarred, perforated, erythematous, retracted or bulging.     Ears:     Comments: No Mastoid tenderness.    Nose:     Comments: Clear rhinorrhea and congestion to bilateral nares.  No sinus tenderness.    Mouth/Throat:     Pharynx: Oropharynx is clear.     Comments: Posterior oropharynx clear.  Mucous membranes DRY. Tonsils without erythema or exudate.  Uvula midline without deviation.  No evidence of PTA or RPA.  No drooling, dysphasia or trismus.  Phonation normal. Neck:     Trachea: Trachea and phonation normal.     Meningeal: Brudzinski's sign and Kernig's sign absent.     Comments: No Neck stiffness or neck rigidity.  No meningismus.  No cervical lymphadenopathy. Cardiovascular:     Rate and Rhythm: Tachycardia present.     Comments: No murmurs rubs or gallops. Pulmonary:     Effort: Tachypnea present.     Breath sounds: Rhonchi present.     Comments: Mild coarse rhonchi throughout, speaks in short sentences, tachypnea Abdominal:     General: Bowel sounds are normal.     Palpations: Abdomen is soft.     Comments: Soft, nontender without rebound or guarding.  No CVA tenderness.  Genitourinary:    Rectum: Guaiac result negative.  Musculoskeletal:        General: Normal range of motion.     Cervical back: Normal range of motion and neck supple.     Right lower leg: Tenderness present.     Left lower leg: Tenderness present.     Comments: Moves all 4 extremities without difficulty.  Lower extremities without edema, erythema or warmth. Tenderness to bilateral calves  Skin:    General: Skin is warm.     Capillary Refill: Capillary refill takes less than 2 seconds.     Comments: Brisk capillary refill.  No rashes or lesions.  Neurological:     General: No focal deficit present.     Mental Status: He is alert and oriented to person, place, and time.     Comments: Ambulatory in department without difficulty.  Cranial nerves II through XII grossly  intact.  No facial droop.  No aphasia.    ED Results / Procedures / Treatments   Labs (all labs ordered are listed, but only abnormal results are displayed) Labs Reviewed  RESP PANEL BY RT-PCR (FLU A&B, COVID) ARPGX2 - Abnormal; Notable for the following components:      Result Value   SARS Coronavirus 2 by RT PCR POSITIVE (*)    All other components within normal limits  CBC WITH DIFFERENTIAL/PLATELET - Abnormal; Notable for the following components:   Platelets 139 (*)    Abs Immature Granulocytes 0.11 (*)    All other components within normal limits  COMPREHENSIVE METABOLIC PANEL - Abnormal; Notable for the following components:   Sodium  133 (*)    Glucose, Bld 107 (*)    Calcium 8.6 (*)    Albumin 3.3 (*)    AST 68 (*)    All other components within normal limits  D-DIMER, QUANTITATIVE (NOT AT [facility]) - Abnormal; Notable for the following components:   D-Dimer, Quant 1.55 (*)    All other components within normal limits  LACTATE DEHYDROGENASE - Abnormal; Notable for the following components:   LDH 525 (*)    All other components within normal limits  FERRITIN - Abnormal; Notable for the following components:   Ferritin 902 (*)    All other components within normal limits  FIBRINOGEN - Abnormal; Notable for the following components:   Fibrinogen 556 (*)    All other components within normal limits  C-REACTIVE PROTEIN - Abnormal; Notable for the following components:   CRP 8.3 (*)    All other components within normal limits  CULTURE, BLOOD (ROUTINE X 2)  CULTURE, BLOOD (ROUTINE X 2)  LACTIC ACID, PLASMA  PROCALCITONIN  TRIGLYCERIDES  LACTIC ACID, PLASMA  TROPONIN I (HIGH SENSITIVITY)  TROPONIN I (HIGH SENSITIVITY)    EKG None  Radiology CT Angio Chest PE W/Cm &/Or Wo Cm  Result Date: 09-11-2020 CLINICAL DATA:  59 year old with shortness of breath and COVID-19 positive. EXAM: CT ANGIOGRAPHY CHEST WITH CONTRAST TECHNIQUE: Multidetector CT imaging of the chest was  performed using the standard protocol during bolus administration of intravenous contrast. Multiplanar CT image reconstructions and MIPs were obtained to evaluate the vascular anatomy. CONTRAST:  OMNIPAQUE IOHEXOL 350 MG/ML SOLN COMPARISON:  Chest radiograph 09/11/20 FINDINGS: Cardiovascular: Main pulmonary arteries are patent. Unfortunately, limited evaluation of the pulmonary arteries beyond the main pulmonary arteries due to extensive motion artifact and surrounding parenchymal lung disease. Heart size is normal. No significant pericardial effusion. There are coronary artery calcifications. Mediastinum/Nodes: Small lymph nodes in the mediastinum but no significant chest lymphadenopathy. No axillary lymph node enlargement. Lungs/Pleura: Trachea and mainstem bronchi are patent. Extensive airspace disease and consolidation throughout both lungs and involving all of the lobes. Parenchymal disease is slightly patchy. No significant pleural fluid. Upper Abdomen: Images of the upper abdomen are unremarkable. Musculoskeletal: No acute abnormality. Review of the MIP images confirms the above findings. IMPRESSION: 1. Extensive airspace disease involving both lungs. Findings are compatible with bilateral pneumonia and likely related to COVID-19. 2. Limited evaluation for pulmonary emboli due to technical limitations. No evidence for a large pulmonary embolism involving the main pulmonary arteries as described. Electronically Signed   By: Richarda Overlie M.D.   On: 09-11-20 14:57   DG Chest Port 1 View  Result Date: 2020-09-11 CLINICAL DATA:  Low oxygen saturation. Body aches and generalized weakness with shortness of breath for 1 week. Positive COVID-19 test on 08/26/2020. EXAM: PORTABLE CHEST 1 VIEW COMPARISON:  None. FINDINGS: There are bilateral hazy airspace lung opacities, in the mid and lower lungs, consistent with multifocal COVID-19 pneumonia. No convincing pleural effusion.  No pneumothorax. Cardiac  silhouette is normal in size. No mediastinal or hilar masses. Skeletal structures are grossly intact. IMPRESSION: 1. Bilateral airspace lung opacities consistent with multifocal COVID-19 pneumonia. Electronically Signed   By: Amie Portland M.D.   On: 09-11-2020 12:24   VAS [place] LOWER EXTREMITY VENOUS (DVT) (ONLY [facility] & [facility])  Result Date: September 11, 2020  Lower Venous DVT Study Indications: COVID pneumonia, dyspnea, edema.  Comparison Study: No prior study Performing Technologist: Gertie Fey MHA, RDMS, RVT, RDCS  Examination Guidelines: A complete evaluation includes B-mode  imaging, spectral Doppler, color Doppler, and power Doppler as needed of all accessible portions of each vessel. Bilateral testing is considered an integral part of a complete examination. Limited examinations for reoccurring indications may be performed as noted. The reflux portion of the exam is performed with the patient in reverse Trendelenburg.  +---------+---------------+---------+-----------+----------+--------------+ RIGHT    CompressibilityPhasicitySpontaneityPropertiesThrombus Aging +---------+---------------+---------+-----------+----------+--------------+ CFV      Full           Yes      Yes                                 +---------+---------------+---------+-----------+----------+--------------+ SFJ      Full                                                        +---------+---------------+---------+-----------+----------+--------------+ FV Prox  Full                                                        +---------+---------------+---------+-----------+----------+--------------+ FV Mid   Full                                                        +---------+---------------+---------+-----------+----------+--------------+ FV DistalFull                                                        +---------+---------------+---------+-----------+----------+--------------+ PFV      Full                                                         +---------+---------------+---------+-----------+----------+--------------+ POP      Full           Yes      Yes                                 +---------+---------------+---------+-----------+----------+--------------+ PTV      Full                                                        +---------+---------------+---------+-----------+----------+--------------+ PERO     Full                                                        +---------+---------------+---------+-----------+----------+--------------+  Right Technical Findings: Not visualized segments include Limited evaluation of calf veins.  +---------+---------------+---------+-----------+----------+--------------+ LEFT     CompressibilityPhasicitySpontaneityPropertiesThrombus Aging +---------+---------------+---------+-----------+----------+--------------+ CFV      Full           Yes      Yes                                 +---------+---------------+---------+-----------+----------+--------------+ SFJ      Full                                                        +---------+---------------+---------+-----------+----------+--------------+ FV Prox  Full                                                        +---------+---------------+---------+-----------+----------+--------------+ FV Mid   Full                                                        +---------+---------------+---------+-----------+----------+--------------+ FV DistalFull                                                        +---------+---------------+---------+-----------+----------+--------------+ PFV      Full                                                        +---------+---------------+---------+-----------+----------+--------------+ POP      Full           Yes      Yes                                  +---------+---------------+---------+-----------+----------+--------------+ PTV      Full                                                        +---------+---------------+---------+-----------+----------+--------------+ PERO     Full                                                        +---------+---------------+---------+-----------+----------+--------------+   Left Technical Findings: Not visualized segments include Limited evaluation of calf veins.   Summary: RIGHT: - There is no evidence of deep vein thrombosis in the lower extremity.  -  No cystic structure found in the popliteal fossa.  LEFT: - There is no evidence of deep vein thrombosis in the lower extremity.  - No cystic structure found in the popliteal fossa.  *See table(s) above for measurements and observations.    Preliminary     Procedures .Critical Care Performed by: Linwood Dibbles, PA-C Authorized by: Linwood Dibbles, PA-C   Critical care provider statement:    Critical care time (minutes):  45   Critical care was necessary to treat or prevent imminent or life-threatening deterioration of the following conditions:  Respiratory failure   Critical care was time spent personally by me on the following activities:  Discussions with consultants, evaluation of patient's response to treatment, examination of patient, ordering and performing treatments and interventions, ordering and review of laboratory studies, ordering and review of radiographic studies, pulse oximetry, re-evaluation of patient's condition, obtaining history from patient or surrogate and review of old charts   (including critical care time)  Medications Ordered in ED Medications  remdesivir 200 mg in sodium chloride 0.9% 250 mL IVPB (has no administration in time range)    Followed by  remdesivir 100 mg in sodium chloride 0.9 % 100 mL IVPB (has no administration in time range)  acetaminophen (TYLENOL) tablet 650 mg (650 mg Oral Given 08/31/2020  1241)  sodium chloride 0.9 % bolus 1,000 mL (0 mLs Intravenous Stopped 09/05/2020 1413)  ondansetron (ZOFRAN) injection 4 mg (4 mg Intravenous Given 09/19/2020 1241)  dexamethasone (DECADRON) injection 10 mg (10 mg Intravenous Given 09/24/2020 1241)  iohexol (OMNIPAQUE) 350 MG/ML injection 100 mL (100 mLs Intravenous Contrast Given 08/30/2020 1422)    ED Course  I have reviewed the triage vital signs and the nursing notes.  Pertinent labs & imaging results that were available during my care of the patient were reviewed by me and considered in my medical decision making (see chart for details).  58 year old presents from UC for hypoxia and fever. Low grade temp on arrival to 100.1 with echocardiogram of tachypnea and hypoxia to 78% on room air.  Diagnosed with Covid approximately week ago.  He is unvaccinated.  Been having blood-tinged sputum as well as rhinorrhea and pain to bilateral posterior calves.  No prior history of PE or DVT.  I do not appreciate any lower extremity edema.  Does have coarse lung sounds throughout and appears to have some mild respiratory distress.  Arrived on 5 L via nasal cannula however increased to 6 L with some improvement.  He still has persistent tachycardia however had is normotensive.  Patient critically ill will need admitted for acute hypoxic respiratory failure likely due to his Covid pneumonia however given hemoptysis, hypercoagulable state with Covid, pain to posterior calves will obtain CTA chest, labs, ultrasound bilateral calves to rule a PE, DVT  Labs and imaging personally reviewed and interpreted:  CBC without leukocytosis, hemoglobin 15.2 Metabolic panel mild hyponatremia to 133, glucose 107, AST 68, noticed electrolyte, renal abnormality Troponin 12 Lactic acid 1.5 D-dimer 1.55 DG chest with focal pneumonia consistent with Covid infection CTA Chest with diffuse multifocal PNA likely due to COVID, No large PE however difficult to assess secondary to  movement. COVID positive  Reassessed.  He is comfortable on 6 L via nasal cannula.  Oxygen saturation 91-93% in room however still some mild tachycardia.  Discussed with patient CT findings, positive Covid test.  Will need to be admitted for acute hypoxic respiratory failure.  He is agreeable to this.  Prelim  US DVT study negative for DVT.  CONSULT with Dr. Thornell Mule with [facility] who agrees to evaluate patient for admission.  The patient appears reasonably stabilized for admission considering the current resources, flow, and capabilities available in the ED at this time, and I doubt any other [facility] requiring further screening and/or treatment in the ED prior to admission.    Patient discussed with attending, Dr. Estell Harpin who agrees with treatment, plan and disposition.   MDM Rules/Calculators/A&P                         Donald Rowe was evaluated in Emergency Department on 09/10/2020 for the symptoms described in the history of present illness. He was evaluated in the context of the global COVID-19 pandemic, which necessitated consideration that the patient might be at risk for infection with the SARS-CoV-2 virus that causes COVID-19. Institutional protocols and algorithms that pertain to the evaluation of patients at risk for COVID-19 are in a state of rapid change based on information released by regulatory bodies including the CDC and federal and state organizations. These policies and algorithms were followed during the patient's care in the ED. Final Clinical Impression(s) / ED Diagnoses Final diagnoses:  Pneumonia due to COVID-19 virus  Acute respiratory failure with hypoxia [facility])    Rx / DC Orders ED Discharge Orders    None       Teighlor Korson A, PA-C 09/28/2020 1617    Bethann Berkshire, MD 09/03/20 0830

## 2020-09-02 NOTE — Plan of Care (Signed)
After talking with [place], he stated that he would not want chest compressions or to be intubated.  I told him that it was possibly nearing this and most would be okay with mechanical ventilation for 5 days or so but he said he had strong feelings about it and would not want this.  He does not have a livin will and said he hasn't talked with family about this.  He had decision making capacity and expressed understanding of what could happen including death.  I told him I would talk to his wife and notify her.  Probably still consult the critical care team for their thoughts  I added on a procalcitonin to see if elevated for consideration of antibiotics And want to talk to them about possibly another antiviral like tocolizumab but patient said he didn't think he would want to do this.   I talked to him about proning on his stomach and incentive spirometry-type breaths.

## 2020-09-03 DIAGNOSIS — J9601 Acute respiratory failure with hypoxia: Secondary | ICD-10-CM | POA: Diagnosis not present

## 2020-09-03 DIAGNOSIS — U071 COVID-19: Secondary | ICD-10-CM | POA: Diagnosis not present

## 2020-09-03 DIAGNOSIS — J1282 Pneumonia due to coronavirus disease 2019: Secondary | ICD-10-CM | POA: Diagnosis not present

## 2020-09-03 DIAGNOSIS — E871 Hypo-osmolality and hyponatremia: Secondary | ICD-10-CM

## 2020-09-03 LAB — CBC WITH DIFFERENTIAL/PLATELET
Abs Immature Granulocytes: 0.2 10*3/uL — ABNORMAL HIGH (ref 0.00–0.07)
Basophils Absolute: 0 10*3/uL (ref 0.0–0.1)
Basophils Relative: 0 %
Eosinophils Absolute: 0 10*3/uL (ref 0.0–0.5)
Eosinophils Relative: 0 %
HCT: 43.9 % (ref 39.0–52.0)
Hemoglobin: 14.8 g/dL (ref 13.0–17.0)
Immature Granulocytes: 2 %
Lymphocytes Relative: 11 %
Lymphs Abs: 1.3 10*3/uL (ref 0.7–4.0)
MCH: 31 pg (ref 26.0–34.0)
MCHC: 33.7 g/dL (ref 30.0–36.0)
MCV: 91.8 fL (ref 80.0–100.0)
Monocytes Absolute: 0.4 10*3/uL (ref 0.1–1.0)
Monocytes Relative: 4 %
Neutro Abs: 9.7 10*3/uL — ABNORMAL HIGH (ref 1.7–7.7)
Neutrophils Relative %: 83 %
Platelets: 150 10*3/uL (ref 150–400)
RBC: 4.78 MIL/uL (ref 4.22–5.81)
RDW: 12.5 % (ref 11.5–15.5)
WBC: 11.6 10*3/uL — ABNORMAL HIGH (ref 4.0–10.5)
nRBC: 0 % (ref 0.0–0.2)

## 2020-09-03 LAB — COMPREHENSIVE METABOLIC PANEL
ALT: 53 U/L — ABNORMAL HIGH (ref 0–44)
AST: 96 U/L — ABNORMAL HIGH (ref 15–41)
Albumin: 3.2 g/dL — ABNORMAL LOW (ref 3.5–5.0)
Alkaline Phosphatase: 140 U/L — ABNORMAL HIGH (ref 38–126)
Anion gap: 11 (ref 5–15)
BUN: 15 mg/dL (ref 6–20)
CO2: 23 mmol/L (ref 22–32)
Calcium: 8.4 mg/dL — ABNORMAL LOW (ref 8.9–10.3)
Chloride: 100 mmol/L (ref 98–111)
Creatinine, Ser: 1.08 mg/dL (ref 0.61–1.24)
GFR, Estimated: >60 mL/min (ref >60)
Glucose, Bld: 118 mg/dL — ABNORMAL HIGH (ref 70–99)
Potassium: 3.9 mmol/L (ref 3.5–5.1)
Sodium: 134 mmol/L — ABNORMAL LOW (ref 135–145)
Total Bilirubin: 0.8 mg/dL (ref 0.3–1.2)
Total Protein: 6.3 g/dL — ABNORMAL LOW (ref 6.5–8.1)

## 2020-09-03 LAB — PROCALCITONIN: Procalcitonin: 0.57 ng/mL

## 2020-09-03 LAB — MRSA PCR SCREENING: MRSA by PCR: NEGATIVE

## 2020-09-03 LAB — HIV ANTIBODY (ROUTINE TESTING W REFLEX): HIV Screen 4th Generation wRfx: NONREACTIVE

## 2020-09-03 LAB — LACTIC ACID, PLASMA
Lactic Acid, Venous: 2.1 mmol/L (ref 0.5–1.9)
Lactic Acid, Venous: 2.1 mmol/L (ref 0.5–1.9)

## 2020-09-03 LAB — FERRITIN: Ferritin: 1218 ng/mL — ABNORMAL HIGH (ref 24–336)

## 2020-09-03 LAB — D-DIMER, QUANTITATIVE: D-Dimer, Quant: 2.57 ug/mL-FEU — ABNORMAL HIGH (ref 0.00–0.50)

## 2020-09-03 LAB — MAGNESIUM: Magnesium: 1.8 mg/dL (ref 1.7–2.4)

## 2020-09-03 LAB — C-REACTIVE PROTEIN: CRP: 11 mg/dL — ABNORMAL HIGH (ref <1.0)

## 2020-09-03 LAB — PHOSPHORUS: Phosphorus: 1.4 mg/dL — ABNORMAL LOW (ref 2.5–4.6)

## 2020-09-03 MED ORDER — CHLORHEXIDINE GLUCONATE CLOTH 2 % EX PADS
6.0000 | MEDICATED_PAD | Freq: Every day | CUTANEOUS | Status: DC
  Administered 2020-09-04 – 2020-09-10 (×9): 6 via TOPICAL

## 2020-09-03 MED ORDER — BARICITINIB 2 MG PO TABS
4.0000 mg | ORAL_TABLET | Freq: Every day | ORAL | Status: DC
  Administered 2020-09-03 – 2020-09-07 (×5): 4 mg via ORAL
  Filled 2020-09-03 (×4): qty 2
  Filled 2020-09-03: qty 4
  Filled 2020-09-03: qty 2

## 2020-09-03 MED ORDER — LEVOTHYROXINE SODIUM 50 MCG PO TABS
50.0000 ug | ORAL_TABLET | Freq: Every day | ORAL | Status: DC
  Administered 2020-09-03 – 2020-09-06 (×3): 50 ug via ORAL
  Filled 2020-09-03 (×3): qty 1

## 2020-09-03 MED ORDER — VITAMIN D3 25 MCG (1000 UNIT) PO TABS
1000.0000 [IU] | ORAL_TABLET | Freq: Every day | ORAL | Status: DC
  Administered 2020-09-03 – 2020-09-05 (×3): 1000 [IU] via ORAL
  Filled 2020-09-03 (×4): qty 1

## 2020-09-03 MED ORDER — ORAL CARE MOUTH RINSE
15.0000 mL | Freq: Two times a day (BID) | OROMUCOSAL | Status: DC
  Administered 2020-09-03 – 2020-09-04 (×2): 15 mL via OROMUCOSAL

## 2020-09-03 MED ORDER — SODIUM CHLORIDE 0.9 % IV SOLN: INTRAVENOUS | Status: AC

## 2020-09-03 MED ORDER — ASPIRIN 81 MG PO CHEW
81.0000 mg | CHEWABLE_TABLET | Freq: Every day | ORAL | Status: DC
  Administered 2020-09-03 – 2020-09-05 (×3): 81 mg via ORAL
  Filled 2020-09-03 (×3): qty 1

## 2020-09-03 MED ORDER — CHLORHEXIDINE GLUCONATE 0.12 % MT SOLN
15.0000 mL | Freq: Two times a day (BID) | OROMUCOSAL | Status: DC
  Administered 2020-09-03 – 2020-09-04 (×5): 15 mL via OROMUCOSAL
  Filled 2020-09-03 (×2): qty 15

## 2020-09-03 MED ORDER — CLONAZEPAM 0.125 MG PO TBDP
0.2500 mg | ORAL_TABLET | Freq: Two times a day (BID) | ORAL | Status: DC | PRN
  Administered 2020-09-03 – 2020-09-04 (×2): 0.25 mg via ORAL
  Filled 2020-09-03: qty 2
  Filled 2020-09-03: qty 1

## 2020-09-03 MED ORDER — METHYLPREDNISOLONE SODIUM SUCC 125 MG IJ SOLR
80.0000 mg | Freq: Two times a day (BID) | INTRAMUSCULAR | Status: DC
  Administered 2020-09-03 – 2020-09-07 (×9): 80 mg via INTRAVENOUS
  Filled 2020-09-03 (×9): qty 2

## 2020-09-03 MED ORDER — PRAVASTATIN SODIUM 40 MG PO TABS
40.0000 mg | ORAL_TABLET | Freq: Every day | ORAL | Status: DC
  Administered 2020-09-03 – 2020-09-05 (×3): 40 mg via ORAL
  Filled 2020-09-03 (×3): qty 2

## 2020-09-03 NOTE — Plan of Care (Signed)
Pt fever broke last temp 99.6 oral, currently on salter @ 10 L and NRB @ 15 L sat 85-90%

## 2020-09-03 NOTE — Progress Notes (Signed)
Pt's stats were dropping. O2 stats dropped to the 70s. Pt stated feeling SOB and anxious. Jose, RN, was in the room with pt. I called respiratory team to adjust pt's oxygen to fit his requirements. I went into room to help calm pt. I asked another nurse to verify with pharmacy and take his anxiety med PO out of the pyxis. Respiratory team placed pt on a HHFNC 60L at 100% FiO2. Stats went up to the 90s and pt was more relaxed after anxiety med was given. This nurse will continue to monitor.

## 2020-09-03 NOTE — Progress Notes (Signed)
CRITICAL VALUE ALERT  Critical Value: lactic acid 2.1  Date & Time Notied:  09/03/2020 0215  Provider Notified: Bruna Potter notified via Amion  Orders Received/Actions taken: awaiting orders, will continue to monitor

## 2020-09-03 NOTE — Progress Notes (Signed)
Temp elevated to 103.5, administered Tylenol 650 mg orally, applied ice packs. RT contacted for possible progression of O2 device. Provider paged via Amion.

## 2020-09-03 NOTE — Plan of Care (Signed)
  Problem: Education: Goal: Knowledge of risk factors and measures for prevention of condition will improve Outcome: Progressing   Problem: Coping: Goal: Psychosocial and spiritual needs will be supported Outcome: Progressing   Problem: Respiratory: Goal: Will maintain a patent airway Outcome: Progressing Goal: Complications related to the disease process, condition or treatment will be avoided or minimized Outcome: Progressing   Problem: Fluid Volume: Goal: Hemodynamic stability will improve Outcome: Progressing   Problem: Clinical Measurements: Goal: Diagnostic test results will improve Outcome: Progressing Goal: Signs and symptoms of infection will decrease Outcome: Progressing   Problem: Respiratory: Goal: Ability to maintain adequate ventilation will improve Outcome: Progressing

## 2020-09-03 NOTE — Progress Notes (Signed)
[facility] PROGRESS NOTE    Progress Note  Donald Rowe  ZHY:[id number] DOB: May 19, 1961 DOA: Sep 12, 2020 PCP: Venetia Constable, MD     Brief Narrative:   Donald Rowe is an 59 y.o. male past medical history of hypothyroidism hyperlipidemia diagnosed with COVID-19 1 week prior to admission not vaccinated comes to the ED satting 78% on room air was placed on 6 L of oxygen CT angio of the chest was done that negative for PE but it did show bilateral infiltrates.  Assessment/Plan:   Acute hypoxemic respiratory failure due to COVID-19 (HCC)/leading to ARDS: Is on a heated high flow of 15 L with an FiO2 of 100% and satting greater than 88. He has persistently been febrile since admission, since admission he was also started on IV Solu-Medrol remdesivir, he was also started on IV Rocephin and azithromycin, but his procalcitonin is low yield we will have a low threshold to discontinue empiric antibiotics we will continue to trend procalcitonin. We will start empirically on baricitinib, The treatment plan and use of medications and known side effects were discussed with patient/family, they were clearly explained that there is no proven definitive treatment for COVID-19 infection, any medications used here are based on published clinical articles/anecdotal data which are not peer-reviewed or randomized control trials.  Complete risks and long-term side effects are unknown, however in the best clinical judgment they seem to be of some clinical benefit rather than medical risks.  Patient/family agree with the treatment plan and want to receive the given medications. Try to keep him prone for Lee 16 hours a day if not prone out of bed to chair. Consult physical therapy, will try to keep him on the dry side. Laboratory markers are trending up we will change dexamethasone to IV Solu-Medrol at a higher dose.  Mild hypovolemic hyponatremia: Continue IV fluids sodium is improving  slowly.   DVT prophylaxis: lovenox Family Communication:none Status is: Inpatient  Remains inpatient appropriate because:Hemodynamically unstable   Dispo: The patient is from: Home              Anticipated d/c is to: Home              Anticipated d/c date is: > 3 days              Patient currently is not medically stable to d/c.        Code Status:     Code Status Orders  (From admission, onward)         Start     Ordered   09-12-2020 2319  Do not attempt resuscitation (DNR)  Continuous       Question Answer Comment  In the event of cardiac or respiratory ARREST Do not call a "code blue"   In the event of cardiac or respiratory ARREST Do not perform Intubation, CPR, defibrillation or ACLS   In the event of cardiac or respiratory ARREST Use medication by any route, position, wound care, and other measures to relive pain and suffering. May use oxygen, suction and manual treatment of airway obstruction as needed for comfort.      09/12/20 2319        Code Status History    Date Active Date Inactive Code Status Order ID Comments User Context   12-Sep-2020 2127 09-12-2020 2319 Full Code [id number]  Charlane Ferretti, DO ED   Advance Care Planning Activity        IV Access:    Peripheral IV  Procedures and diagnostic studies:   CT Angio Chest PE W/Cm &/Or Wo Cm  Result Date: 09/24/2020 CLINICAL DATA:  59 year old with shortness of breath and COVID-19 positive. EXAM: CT ANGIOGRAPHY CHEST WITH CONTRAST TECHNIQUE: Multidetector CT imaging of the chest was performed using the standard protocol during bolus administration of intravenous contrast. Multiplanar CT image reconstructions and MIPs were obtained to evaluate the vascular anatomy. CONTRAST:  OMNIPAQUE IOHEXOL 350 MG/ML SOLN COMPARISON:  Chest radiograph 09/29/2020 FINDINGS: Cardiovascular: Main pulmonary arteries are patent. Unfortunately, limited evaluation of the pulmonary arteries beyond the main pulmonary  arteries due to extensive motion artifact and surrounding parenchymal lung disease. Heart size is normal. No significant pericardial effusion. There are coronary artery calcifications. Mediastinum/Nodes: Small lymph nodes in the mediastinum but no significant chest lymphadenopathy. No axillary lymph node enlargement. Lungs/Pleura: Trachea and mainstem bronchi are patent. Extensive airspace disease and consolidation throughout both lungs and involving all of the lobes. Parenchymal disease is slightly patchy. No significant pleural fluid. Upper Abdomen: Images of the upper abdomen are unremarkable. Musculoskeletal: No acute abnormality. Review of the MIP images confirms the above findings. IMPRESSION: 1. Extensive airspace disease involving both lungs. Findings are compatible with bilateral pneumonia and likely related to COVID-19. 2. Limited evaluation for pulmonary emboli due to technical limitations. No evidence for a large pulmonary embolism involving the main pulmonary arteries as described. Electronically Signed   By: Richarda Overlie M.D.   On: 08/31/2020 14:57   DG Chest Port 1 View  Result Date: 09/23/2020 CLINICAL DATA:  Low oxygen saturation. Body aches and generalized weakness with shortness of breath for 1 week. Positive COVID-19 test on 08/26/2020. EXAM: PORTABLE CHEST 1 VIEW COMPARISON:  None. FINDINGS: There are bilateral hazy airspace lung opacities, in the mid and lower lungs, consistent with multifocal COVID-19 pneumonia. No convincing pleural effusion.  No pneumothorax. Cardiac silhouette is normal in size. No mediastinal or hilar masses. Skeletal structures are grossly intact. IMPRESSION: 1. Bilateral airspace lung opacities consistent with multifocal COVID-19 pneumonia. Electronically Signed   By: Amie Portland M.D.   On: 09/26/2020 12:24   VAS [place] LOWER EXTREMITY VENOUS (DVT) (ONLY [facility] & [facility])  Result Date: 09/26/2020  Lower Venous DVT Study Indications: COVID pneumonia, dyspnea, edema.   Comparison Study: No prior study Performing Technologist: Gertie Fey MHA, RDMS, RVT, RDCS  Examination Guidelines: A complete evaluation includes B-mode imaging, spectral Doppler, color Doppler, and power Doppler as needed of all accessible portions of each vessel. Bilateral testing is considered an integral part of a complete examination. Limited examinations for reoccurring indications may be performed as noted. The reflux portion of the exam is performed with the patient in reverse Trendelenburg.  +---------+---------------+---------+-----------+----------+--------------+ RIGHT    CompressibilityPhasicitySpontaneityPropertiesThrombus Aging +---------+---------------+---------+-----------+----------+--------------+ CFV      Full           Yes      Yes                                 +---------+---------------+---------+-----------+----------+--------------+ SFJ      Full                                                        +---------+---------------+---------+-----------+----------+--------------+ FV Prox  Full                                                        +---------+---------------+---------+-----------+----------+--------------+  FV Mid   Full                                                        +---------+---------------+---------+-----------+----------+--------------+ FV DistalFull                                                        +---------+---------------+---------+-----------+----------+--------------+ PFV      Full                                                        +---------+---------------+---------+-----------+----------+--------------+ POP      Full           Yes      Yes                                 +---------+---------------+---------+-----------+----------+--------------+ PTV      Full                                                         +---------+---------------+---------+-----------+----------+--------------+ PERO     Full                                                        +---------+---------------+---------+-----------+----------+--------------+   Right Technical Findings: Not visualized segments include Limited evaluation of calf veins.  +---------+---------------+---------+-----------+----------+--------------+ LEFT     CompressibilityPhasicitySpontaneityPropertiesThrombus Aging +---------+---------------+---------+-----------+----------+--------------+ CFV      Full           Yes      Yes                                 +---------+---------------+---------+-----------+----------+--------------+ SFJ      Full                                                        +---------+---------------+---------+-----------+----------+--------------+ FV Prox  Full                                                        +---------+---------------+---------+-----------+----------+--------------+ FV Mid   Full                                                        +---------+---------------+---------+-----------+----------+--------------+  FV DistalFull                                                        +---------+---------------+---------+-----------+----------+--------------+ PFV      Full                                                        +---------+---------------+---------+-----------+----------+--------------+ POP      Full           Yes      Yes                                 +---------+---------------+---------+-----------+----------+--------------+ PTV      Full                                                        +---------+---------------+---------+-----------+----------+--------------+ PERO     Full                                                        +---------+---------------+---------+-----------+----------+--------------+   Left Technical Findings: Not  visualized segments include Limited evaluation of calf veins.   Summary: RIGHT: - There is no evidence of deep vein thrombosis in the lower extremity.  - No cystic structure found in the popliteal fossa.  LEFT: - There is no evidence of deep vein thrombosis in the lower extremity.  - No cystic structure found in the popliteal fossa.  *See table(s) above for measurements and observations. Electronically signed by Coral Else MD on 10/01/2020 at 5:31:50 PM.    Final      Medical Consultants:    None.  Anti-Infectives:   IV remdesivir and baricitinib  Subjective:    Donald Rowe he relates his breathing is not improved compared to yesterday.  Objective:    Vitals:   09/03/20 0400 09/03/20 0437 09/03/20 0456 09/03/20 0500  BP: (!) 113/43 (!) 113/53 (!) 131/43 (!) 132/45  Pulse: (!) 115 (!) 108 (!) 101 98  Resp: (!) 52 (!) 47 (!) 26 (!) 38  Temp: 99.6 F (37.6 C) (!) 101.6 F (38.7 C)    TempSrc: Oral Axillary    SpO2: (!) 84% (!) 81% 93% 92%   SpO2: 92 % O2 Flow Rate (L/min): 15 L/min (plus 15 salter, proned) FiO2 (%): 100 %   Intake/Output Summary (Last 24 hours) at 09/03/2020 0711 Last data filed at 09/03/2020 0300 Gross per 24 hour  Intake 1829.74 ml  Output 500 ml  Net 1329.74 ml   There were no vitals filed for this visit.  Exam: General exam: In no acute distress. Respiratory system: Good air movement and clear to auscultation. Cardiovascular system: S1 & S2 heard, RRR. No JVD. Gastrointestinal system: Abdomen is nondistended, soft and nontender.  Extremities: No pedal  edema. Skin: No rashes, lesions or ulcers Psychiatry: Judgement and insight appear normal. Mood & affect appropriate.    Data Reviewed:    Labs: Basic Metabolic Panel: Recent Labs  Lab 09-28-20 1242 09/03/20 0143  NA 133* 134*  K 4.4 3.9  CL 99 100  CO2 24 23  GLUCOSE 107* 118*  BUN 14 15  CREATININE 1.05 1.08  CALCIUM 8.6* 8.4*  MG  --  1.8  PHOS  --  1.4*   GFR CrCl  cannot be calculated (Unknown ideal weight.). Liver Function Tests: Recent Labs  Lab September 28, 2020 1242 09/03/20 0143  AST 68* 96*  ALT 44 53*  ALKPHOS 118 140*  BILITOT 0.9 0.8  PROT 6.5 6.3*  ALBUMIN 3.3* 3.2*   No results for input(s): LIPASE, AMYLASE in the last 168 hours. No results for input(s): AMMONIA in the last 168 hours. Coagulation profile No results for input(s): INR, PROTIME in the last 168 hours. COVID-19 Labs  Recent Labs    09-28-20 1242 09/03/20 0143  DDIMER 1.55* 2.57*  FERRITIN 902* 1,218*  LDH 525*  --   CRP 8.3* 11.0*    Lab Results  Component Value Date   SARSCOV2NAA POSITIVE (A) 09/28/20    CBC: Recent Labs  Lab 2020/09/28 1242 09/03/20 0143  WBC 8.1 11.6*  NEUTROABS 6.1 9.7*  HGB 15.2 14.8  HCT 44.4 43.9  MCV 91.5 91.8  PLT 139* 150   Cardiac Enzymes: No results for input(s): CKTOTAL, CKMB, CKMBINDEX, TROPONINI in the last 168 hours. BNP (last 3 results) No results for input(s): PROBNP in the last 8760 hours. CBG: No results for input(s): GLUCAP in the last 168 hours. D-Dimer: Recent Labs    09-28-2020 1242 09/03/20 0143  DDIMER 1.55* 2.57*   Hgb A1c: No results for input(s): HGBA1C in the last 72 hours. Lipid Profile: Recent Labs    09/28/20 1242  TRIG 101   Thyroid function studies: No results for input(s): TSH, T4TOTAL, T3FREE, THYROIDAB in the last 72 hours.  Invalid input(s): FREET3 Anemia work up: Recent Labs    Sep 28, 2020 1242 09/03/20 0143  FERRITIN 902* 1,218*   Sepsis Labs: Recent Labs  Lab 09-28-20 1242 09/28/2020 2247 09/03/20 0143  PROCALCITON 0.18  --  0.57  WBC 8.1  --  11.6*  LATICACIDVEN 1.5 2.2* 2.1*  2.1*   Microbiology Recent Results (from the past 240 hour(s))  Resp Panel by RT-PCR (Flu A&B, Covid) Nasopharyngeal Swab     Status: Abnormal   Collection Time: 09/28/20 12:42 PM   Specimen: Nasopharyngeal Swab; Nasopharyngeal(NP) swabs in vial transport medium  Result Value Ref Range Status    SARS Coronavirus 2 by RT PCR POSITIVE (A) NEGATIVE Final    Comment: RESULT CALLED TO, READ BACK BY AND VERIFIED WITH: HENDERLY, B PA 1522 28-Sep-2020 JM (NOTE) SARS-CoV-2 target nucleic acids are DETECTED.  The SARS-CoV-2 RNA is generally detectable in upper respiratory specimens during the acute phase of infection. Positive results are indicative of the presence of the identified virus, but do not rule out bacterial infection or co-infection with other pathogens not detected by the test. Clinical correlation with patient history and other diagnostic information is necessary to determine patient infection status. The expected result is Negative.  Fact Sheet for Patients: BloggerCourse.com  Fact Sheet for Healthcare Providers: SeriousBroker.it  This test is not yet approved or cleared by the [place] FDA and  has been authorized for detection and/or diagnosis of SARS-CoV-2 by FDA under an Emergency Use Authorization (EUA).  This EUA will remain in effect (meaning this test can be [place] ed) for the duration of  the COVID-19 declaration under Section 564(b)(1) of the Act, 21 U.S.C. section 360bbb-3(b)(1), unless the authorization is terminated or revoked sooner.     Influenza A by PCR NEGATIVE NEGATIVE Final   Influenza B by PCR NEGATIVE NEGATIVE Final    Comment: (NOTE) The Xpert Xpress SARS-CoV-2/FLU/RSV plus assay is intended as an aid in the diagnosis of influenza from Nasopharyngeal swab specimens and should not be used as a sole basis for treatment. Nasal washings and aspirates are unacceptable for Xpert Xpress SARS-CoV-2/FLU/RSV testing.  Fact Sheet for Patients: BloggerCourse.com  Fact Sheet for Healthcare Providers: SeriousBroker.it  This test is not yet approved or cleared by the [place] FDA and has been authorized for detection and/or diagnosis of SARS-CoV-2  by FDA under an Emergency Use Authorization (EUA). This EUA will remain in effect (meaning this test can be used) for the duration of the COVID-19 declaration under Section 564(b)(1) of the Act, 21 U.S.C. section 360bbb-3(b)(1), unless the authorization is terminated or revoked.  Performed at [facility], [address], [place], [place] [address]      Medications:   . aspirin  81 mg Oral Daily  . chlorhexidine  15 mL Mouth Rinse BID  . Chlorhexidine Gluconate Cloth  6 each Topical Daily  . cholecalciferol  1,000 Units Oral Daily  . dexamethasone  6 mg Oral Q24H  . enoxaparin (LOVENOX) injection  40 mg Subcutaneous Q24H  . levothyroxine  50 mcg Oral Q0600  . mouth rinse  15 mL Mouth Rinse q12n4p  . pravastatin  40 mg Oral q1800  . sodium chloride  1-2 spray Each Nare Q2H   Continuous Infusions: . azithromycin Stopped (09/03/20 0227)  . cefTRIAXone (ROCEPHIN)  IV Stopped (09/03/20 0041)  . remdesivir 100 mg in NS 100 mL        LOS: 1 day   Marinda Elk  [facility]  09/03/2020, 7:11 AM

## 2020-09-04 ENCOUNTER — Inpatient Hospital Stay (HOSPITAL_COMMUNITY): Payer: HRSA Program

## 2020-09-04 DIAGNOSIS — E871 Hypo-osmolality and hyponatremia: Secondary | ICD-10-CM

## 2020-09-04 DIAGNOSIS — J9601 Acute respiratory failure with hypoxia: Secondary | ICD-10-CM

## 2020-09-04 DIAGNOSIS — U071 COVID-19: Secondary | ICD-10-CM

## 2020-09-04 DIAGNOSIS — J80 Acute respiratory distress syndrome: Secondary | ICD-10-CM | POA: Diagnosis not present

## 2020-09-04 DIAGNOSIS — J1282 Pneumonia due to coronavirus disease 2019: Secondary | ICD-10-CM | POA: Diagnosis not present

## 2020-09-04 LAB — COMPREHENSIVE METABOLIC PANEL
ALT: 57 U/L — ABNORMAL HIGH (ref 0–44)
AST: 98 U/L — ABNORMAL HIGH (ref 15–41)
Albumin: 2.7 g/dL — ABNORMAL LOW (ref 3.5–5.0)
Alkaline Phosphatase: 114 U/L (ref 38–126)
Anion gap: 10 (ref 5–15)
BUN: 23 mg/dL — ABNORMAL HIGH (ref 6–20)
CO2: 22 mmol/L (ref 22–32)
Calcium: 8.2 mg/dL — ABNORMAL LOW (ref 8.9–10.3)
Chloride: 104 mmol/L (ref 98–111)
Creatinine, Ser: 0.88 mg/dL (ref 0.61–1.24)
GFR, Estimated: >60 mL/min (ref >60)
Glucose, Bld: 140 mg/dL — ABNORMAL HIGH (ref 70–99)
Potassium: 4.1 mmol/L (ref 3.5–5.1)
Sodium: 136 mmol/L (ref 135–145)
Total Bilirubin: 0.9 mg/dL (ref 0.3–1.2)
Total Protein: 5.4 g/dL — ABNORMAL LOW (ref 6.5–8.1)

## 2020-09-04 LAB — CBC WITH DIFFERENTIAL/PLATELET
Abs Immature Granulocytes: 0.29 10*3/uL — ABNORMAL HIGH (ref 0.00–0.07)
Basophils Absolute: 0.1 10*3/uL (ref 0.0–0.1)
Basophils Relative: 0 %
Eosinophils Absolute: 0 10*3/uL (ref 0.0–0.5)
Eosinophils Relative: 0 %
HCT: 42.3 % (ref 39.0–52.0)
Hemoglobin: 14.2 g/dL (ref 13.0–17.0)
Immature Granulocytes: 2 %
Lymphocytes Relative: 11 %
Lymphs Abs: 1.8 10*3/uL (ref 0.7–4.0)
MCH: 30.8 pg (ref 26.0–34.0)
MCHC: 33.6 g/dL (ref 30.0–36.0)
MCV: 91.8 fL (ref 80.0–100.0)
Monocytes Absolute: 0.7 10*3/uL (ref 0.1–1.0)
Monocytes Relative: 4 %
Neutro Abs: 14.6 10*3/uL — ABNORMAL HIGH (ref 1.7–7.7)
Neutrophils Relative %: 83 %
Platelets: 150 10*3/uL (ref 150–400)
RBC: 4.61 MIL/uL (ref 4.22–5.81)
RDW: 12.7 % (ref 11.5–15.5)
WBC: 17.6 10*3/uL — ABNORMAL HIGH (ref 4.0–10.5)
nRBC: 0 % (ref 0.0–0.2)

## 2020-09-04 LAB — BLOOD GAS, ARTERIAL
Acid-base deficit: 1.3 mmol/L (ref 0.0–2.0)
Bicarbonate: 25.1 mmol/L (ref 20.0–28.0)
FIO2: 100
O2 Saturation: 95.8 %
Patient temperature: 99.6
pCO2 arterial: 52 mmHg — ABNORMAL HIGH (ref 32.0–48.0)
pH, Arterial: 7.308 — ABNORMAL LOW (ref 7.350–7.450)
pO2, Arterial: 92.2 mmHg (ref 83.0–108.0)

## 2020-09-04 LAB — D-DIMER, QUANTITATIVE: D-Dimer, Quant: 2.14 ug/mL-FEU — ABNORMAL HIGH (ref 0.00–0.50)

## 2020-09-04 LAB — TRIGLYCERIDES: Triglycerides: 126 mg/dL (ref <150)

## 2020-09-04 LAB — C-REACTIVE PROTEIN: CRP: 11.6 mg/dL — ABNORMAL HIGH (ref <1.0)

## 2020-09-04 MED ORDER — SODIUM CHLORIDE 0.9 % IV SOLN
1.0000 g | INTRAVENOUS | Status: DC
  Filled 2020-09-04: qty 10

## 2020-09-04 MED ORDER — ETOMIDATE 2 MG/ML IV SOLN
INTRAVENOUS | Status: AC
  Filled 2020-09-04: qty 20

## 2020-09-04 MED ORDER — CHLORHEXIDINE GLUCONATE 0.12% ORAL RINSE (MEDLINE KIT)
15.0000 mL | Freq: Two times a day (BID) | OROMUCOSAL | Status: DC
  Administered 2020-09-04 – 2020-10-12 (×74): 15 mL via OROMUCOSAL

## 2020-09-04 MED ORDER — DOCUSATE SODIUM 50 MG/5ML PO LIQD
100.0000 mg | Freq: Two times a day (BID) | ORAL | Status: DC
  Administered 2020-09-05 – 2020-10-12 (×58): 100 mg
  Filled 2020-09-04 (×58): qty 10

## 2020-09-04 MED ORDER — SUCCINYLCHOLINE CHLORIDE 200 MG/10ML IV SOSY
PREFILLED_SYRINGE | INTRAVENOUS | Status: AC
  Filled 2020-09-04: qty 10

## 2020-09-04 MED ORDER — STERILE WATER FOR INJECTION IJ SOLN
INTRAMUSCULAR | Status: AC
  Filled 2020-09-04: qty 10

## 2020-09-04 MED ORDER — FENTANYL CITRATE (PF) 100 MCG/2ML IJ SOLN: 50.0000 ug | Freq: Once | INTRAMUSCULAR | Status: AC

## 2020-09-04 MED ORDER — FENTANYL CITRATE (PF) 100 MCG/2ML IJ SOLN
INTRAMUSCULAR | Status: AC
  Administered 2020-09-04: 100 ug
  Filled 2020-09-04: qty 2

## 2020-09-04 MED ORDER — POLYETHYLENE GLYCOL 3350 17 G PO PACK
17.0000 g | PACK | Freq: Every day | ORAL | Status: DC
  Administered 2020-09-05 – 2020-09-07 (×3): 17 g
  Filled 2020-09-04 (×3): qty 1

## 2020-09-04 MED ORDER — MIDAZOLAM HCL 2 MG/2ML IJ SOLN
INTRAMUSCULAR | Status: AC
  Filled 2020-09-04: qty 4

## 2020-09-04 MED ORDER — VECURONIUM BROMIDE 10 MG IV SOLR
INTRAVENOUS | Status: AC
  Filled 2020-09-04: qty 10

## 2020-09-04 MED ORDER — SODIUM CHLORIDE 0.9 % IV SOLN: 500.0000 mg | INTRAVENOUS | Status: DC

## 2020-09-04 MED ORDER — MIDAZOLAM HCL 2 MG/2ML IJ SOLN
INTRAMUSCULAR | Status: AC
  Administered 2020-09-04: 4 mg
  Filled 2020-09-04: qty 2

## 2020-09-04 MED ORDER — FENTANYL BOLUS VIA INFUSION
50.0000 ug | INTRAVENOUS | Status: DC | PRN
  Administered 2020-09-04 – 2020-10-07 (×37): 50 ug via INTRAVENOUS
  Filled 2020-09-04: qty 50

## 2020-09-04 MED ORDER — LORAZEPAM 2 MG/ML IJ SOLN
1.0000 mg | INTRAMUSCULAR | Status: DC | PRN
  Filled 2020-09-04: qty 1

## 2020-09-04 MED ORDER — MIDAZOLAM HCL 2 MG/2ML IJ SOLN
2.0000 mg | INTRAMUSCULAR | Status: DC | PRN
  Administered 2020-09-09 (×3): 2 mg via INTRAVENOUS
  Administered 2020-09-09: 08:00:00 1 mg via INTRAVENOUS
  Administered 2020-09-09 – 2020-09-11 (×7): 2 mg via INTRAVENOUS
  Filled 2020-09-04 (×12): qty 2

## 2020-09-04 MED ORDER — MIDAZOLAM HCL 2 MG/2ML IJ SOLN
2.0000 mg | INTRAMUSCULAR | Status: AC | PRN
  Administered 2020-09-04 (×2): 2 mg via INTRAVENOUS
  Administered 2020-09-09: 08:00:00 1 mg via INTRAVENOUS
  Filled 2020-09-04 (×2): qty 2

## 2020-09-04 MED ORDER — FENTANYL 2500MCG IN NS 250ML (10MCG/ML) PREMIX INFUSION
50.0000 ug/h | INTRAVENOUS | Status: DC
  Administered 2020-09-04: 50 ug/h via INTRAVENOUS
  Filled 2020-09-04: qty 250

## 2020-09-04 MED ORDER — PHENYLEPHRINE 40 MCG/ML (10ML) SYRINGE FOR IV PUSH (FOR BLOOD PRESSURE SUPPORT)
PREFILLED_SYRINGE | INTRAVENOUS | Status: AC
  Administered 2020-09-04: 1 ug
  Filled 2020-09-04: qty 10

## 2020-09-04 MED ORDER — ORAL CARE MOUTH RINSE
15.0000 mL | OROMUCOSAL | Status: DC
  Administered 2020-09-04 – 2020-10-12 (×363): 15 mL via OROMUCOSAL

## 2020-09-04 MED ORDER — PROPOFOL 1000 MG/100ML IV EMUL
5.0000 ug/kg/min | INTRAVENOUS | Status: DC
  Administered 2020-09-04: 5 ug/kg/min via INTRAVENOUS
  Administered 2020-09-05: 30 ug/kg/min via INTRAVENOUS
  Administered 2020-09-05: 40 ug/kg/min via INTRAVENOUS
  Filled 2020-09-04 (×3): qty 100

## 2020-09-04 MED ORDER — ROCURONIUM BROMIDE 10 MG/ML (PF) SYRINGE
PREFILLED_SYRINGE | INTRAVENOUS | Status: AC
  Administered 2020-09-04: 100 mg
  Filled 2020-09-04: qty 10

## 2020-09-04 NOTE — Progress Notes (Addendum)
eLink Physician-Brief Progress Note Patient Name: Donald Rowe DOB: 10-22-1960 MRN: [id number]   Date of Service  09/04/2020  HPI/Events of Note  Notified of recent intubation. Sedated on Fentanyl drip but has required Fentanyl and Versed pushes  VAC 35/420/100%/12 PEEP, MV 14 ABG pending CXR with ETT 1-2 cm above carina  BP 160/85  HR 107  eICU Interventions  Will add propofol drip eLink to be informed of ABG result  ABG 7.32/52/92     Intervention Category Major Interventions: Respiratory failure - evaluation and management  Darl Pikes 09/04/2020, 8:46 PM

## 2020-09-04 NOTE — Consult Note (Addendum)
NAME:  Donald Rowe, MRN:  [id number], DOB:  [birth year], LOS: 2 ADMISSION DATE:  09/14/2020, CONSULTATION DATE:  12/6 REFERRING MD:  Dr. David Stall, CHIEF COMPLAINT:  SOB    Brief History   59 y/o M admitted 12/4 with 1 week hx of SOB, known COVID positive.  He is unvaccinated.  CTA chest negative for PE but demonstrated diffuse bilateral infiltrates.   History of present illness   59 y/o unvaccinated male, admitted 12/4 with a one week history of shortness of breath.    The patient began feeling poorly the day before Thanksgiving with fever, weakness and body aches. He tested positive for COVID on 12/27 prior to presentation.  He developed progressive shortness of breath.  In the ER, the patient was found to have saturations of 78%.  He was started on 6L O2 with initial improvement in saturations.  The patient was admitted per [facility]. CTA of the Chest was assessed and negative for pulmonary embolism but showed diffuse bilateral infiltrates. He was treated with IV remdesivir, steroids and baricitinib.  Initially, he was also treated with antibiotics but these were stopped on 12/6 with a low procalcitonin.  LE doppler negative for DVT. The patient elected to be DNR / no intubation.  He developed worsening respiratory failure with hypoxia requiring 60L flow 100% and PCCM consulted 12/6 for evaluation.   Past Medical History  Hypothyroidism  HLD   Significant Hospital Events   12/04 Admit  12/06 PCCM consulted   Consults:    Procedures:    Significant Diagnostic Tests:   CTA Chest 12/4 >> extensive bilateral airspace disease, no large PE identified, limited study   LE Venous Duplex 12/4 >> negative for DVT bilaterally   Micro Data:  COVID 12/4 >> negative  Influenza A/B 12/4 >> negative  MRSA PCR 12/5 >> negative  BCx2 12/4 >>   Antimicrobials:  Azithromycin 12/5 >> 12/6  Ceftriaxone 12/5 >> 12/6   Interim history/subjective:  Pt reports breathing is "okay".  States he is  coughing up some brown sputum.  Denies fevers.   Objective   Blood pressure (!) 147/68, pulse 90, temperature 98.5 F (36.9 C), temperature source Axillary, resp. rate (!) 29, height 5\' 9"  (1.753 m), weight 101.9 kg, SpO2 (!) 86 %.    FiO2 (%):  [90 %-100 %] 100 %   Intake/Output Summary (Last 24 hours) at 09/04/2020 1205 Last data filed at 09/04/2020 14/02/2020 Gross per 24 hour  Intake 1112.18 ml  Output 825 ml  Net 287.18 ml   Filed Weights   09/03/20 1700  Weight: 101.9 kg    Examination: General: adult male lying in bed in NAD   HEENT: MM pink/moist, [place] + NRB O2 in place, anicteric  Neuro: AAOx4, speech clear, MAE  CV: s1s2 RRR, no m/r/g PULM: tachypnea but non-labored at rest, lungs bilaterally with posterior bronchial breath sounds  GI: soft, bsx4 active  Extremities: warm/dry, no edema  Skin: no rashes or lesions  Resolved Hospital Problem list      Assessment & Plan:   Acute Hypoxemic Respiratory Failure secondary to COVID PNA with ARDS CT images reviewed, he has significant infiltrate burden -wean O2 for sats >85%, anticipate desaturation with exertion and slow recovery  -prone positioning as able  -continue solumedrol IV BID -continue remdesivir, baricitinib -pulmonary hygiene - IS, mobilize -follow intermittent CXR  -DNR / DNI  -could consider QHS BiPAP with PRN ativan prior to encourage use to allow respiratory muscle splinting overnight -daily assessment  for diuresis need, evolemic on exam 12/6  -encouraged sleep at night, good nutrition and prone / mobilize during day  Best practice (evaluated daily)  Diet: as tolerated  Pain/Anxiety/Delirium protocol (if indicated): n/a, PRN anxiety medications per [facility]  VAP protocol (if indicated): n/a  DVT prophylaxis: lovenox  GI prophylaxis: n/a  Glucose control: Glucose wnl, follow trend on steroids  Mobility: as tolerated  Last date of multidisciplinary goals of care discussion: per [facility].  Family and staff present  n/a  Summary of discussion n/a  Follow up goals of care discussion due: 12/15 Code Status: DNR / DNI. Confirmed with patient that he would not want intubation / CPR.  Disposition: SDU, per [facility]   Labs   CBC: Recent Labs  Lab 2020/09/12 1242 09/03/20 0143 09/04/20 0255  WBC 8.1 11.6* 17.6*  NEUTROABS 6.1 9.7* 14.6*  HGB 15.2 14.8 14.2  HCT 44.4 43.9 42.3  MCV 91.5 91.8 91.8  PLT 139* 150 150    Basic Metabolic Panel: Recent Labs  Lab 09/12/2020 1242 09/03/20 0143 09/04/20 0255  NA 133* 134* 136  K 4.4 3.9 4.1  CL 99 100 104  CO2 24 23 22   GLUCOSE 107* 118* 140*  BUN 14 15 23*  CREATININE 1.05 1.08 0.88  CALCIUM 8.6* 8.4* 8.2*  MG  --  1.8  --   PHOS  --  1.4*  --    GFR: Estimated Creatinine Clearance: 106.4 mL/min (by C-G formula based on SCr of 0.88 mg/dL). Recent Labs  Lab 09/12/2020 1242 09/12/2020 2247 09/03/20 0143 09/04/20 0255  PROCALCITON 0.18  --  0.57  --   WBC 8.1  --  11.6* 17.6*  LATICACIDVEN 1.5 2.2* 2.1*  2.1*  --     Liver Function Tests: Recent Labs  Lab 09-12-20 1242 09/03/20 0143 09/04/20 0255  AST 68* 96* 98*  ALT 44 53* 57*  ALKPHOS 118 140* 114  BILITOT 0.9 0.8 0.9  PROT 6.5 6.3* 5.4*  ALBUMIN 3.3* 3.2* 2.7*   No results for input(s): LIPASE, AMYLASE in the last 168 hours. No results for input(s): AMMONIA in the last 168 hours.  ABG No results found for: PHART, PCO2ART, PO2ART, HCO3, TCO2, ACIDBASEDEF, O2SAT   Coagulation Profile: No results for input(s): INR, PROTIME in the last 168 hours.  Cardiac Enzymes: No results for input(s): CKTOTAL, CKMB, CKMBINDEX, TROPONINI in the last 168 hours.  HbA1C: No results found for: HGBA1C  CBG: No results for input(s): GLUCAP in the last 168 hours.  Review of Systems:   Gen: Denies fever, chills, weight change, fatigue, night sweats HEENT: Denies blurred vision, double vision, hearing loss, tinnitus, sinus congestion, rhinorrhea, sore throat, neck stiffness, dysphagia PULM: Denies  shortness of breath, cough, sputum production, hemoptysis, wheezing CV: Denies chest pain, edema, orthopnea, paroxysmal nocturnal dyspnea, palpitations GI: Denies abdominal pain, nausea, vomiting, diarrhea, hematochezia, melena, constipation, change in bowel habits GU: Denies dysuria, hematuria, polyuria, oliguria, urethral discharge Endocrine: Denies hot or cold intolerance, polyuria, polyphagia or appetite change Derm: Denies rash, dry skin, scaling or peeling skin change Heme: Denies easy bruising, bleeding, bleeding gums Neuro: Denies headache, numbness, weakness, slurred speech, loss of memory or consciousness  Past Medical History  He,  has no past medical history on file.   Surgical History   History reviewed. No pertinent surgical history.   Social History   reports that he has never smoked. He has never used smokeless tobacco.   Family History   His family history is not on  file.   Allergies No Known Allergies   Home Medications  Prior to Admission medications   Medication Sig Start Date End Date Taking? Authorizing Provider  aspirin 81 MG chewable tablet Chew 81 mg by mouth daily.   Yes [provider]  cholecalciferol (VITAMIN D) 25 MCG (1000 UNIT) tablet Take 1,000 Units by mouth daily.   Yes [provider]  HYDROcodone-homatropine (HYCODAN) 5-1.5 MG/5ML syrup Take 5 mLs by mouth every 4 (four) hours as needed for cough.  03/20/20  Yes [provider]  ibuprofen (ADVIL) 200 MG tablet Take 600-800 mg by mouth every 6 (six) hours as needed for fever or headache (pain).   Yes [provider]  levothyroxine (SYNTHROID) 50 MCG tablet Take 50 mcg by mouth daily. 06/30/20  Yes [provider]  meloxicam (MOBIC) 15 MG tablet Take 15 mg by mouth daily. 06/27/20  Yes [provider]  Phenyleph-Diphenhyd-DM-APAP (THERAFLU SEVERE COLD & COUGH) Packet MISC Take 1 packet by mouth once.   Yes [provider]  Pitavastatin  Calcium (LIVALO) 4 MG TABS Take 2 mg by mouth daily.   Yes [provider]     Critical care time: 30 minutes    Canary Brim, MSN, NP-C, AGACNP-BC [facility] 09/04/2020, 12:05 PM   Please see [email or web address] for pager details.

## 2020-09-04 NOTE — TOC Initial Note (Signed)
Transition of Care [facility]) - Initial/Assessment Note    Patient Details  Name: Donald Rowe MRN: [id number] Date of Birth: 1961/03/18  Transition of Care [facility]) CM/SW Contact:    Golda Acre, RN Phone Number: 09/04/2020, 8:59 AM  Clinical Narrative:                 59 y.o. male past medical history of hypothyroidism hyperlipidemia diagnosed with COVID-19 1 week prior to admission not vaccinated comes to the ED satting 78% on room air was placed on 6 L of oxygen CT angio of the chest was done that negative for PE but it did show bilateral infiltrates.  Assessment/Plan:   Acute hypoxemic respiratory failure due to COVID-19 (HCC)/leading to ARDS: Still on heated non-rebreather and high flow flow rate of 45 which is an improvement for yesterday. He has defervesced, his inflammatory markers are persistently elevated. Continue IV remdesivir, steroids and oral baricitinib. His procalcitonin is low yield to discontinue empiric antibiotics. His white blood cell is increasing likely due to steroids. He looks much better today than yesterday, try to keep the patient prone for at least 16 hours a day if not prone out of bed to chair.   Mild hypovolemic hyponatremia: Continue IV fluids sodium is improving slowly. PLAN: to return to home lives alone but has a daughter in the area Following for progression  Expected Discharge Plan: Home/Self Care Barriers to Discharge: No Barriers Identified   Patient Goals and CMS Choice Patient states their goals for this hospitalization and ongoing recovery are:: to go home CMS Medicare.gov Compare Post Acute Care list provided to:: Patient    Expected Discharge Plan and Services Expected Discharge Plan: Home/Self Care   Discharge Planning Services: CM Consult   Living arrangements for the past 2 months: Single Family Home                                      Prior Living Arrangements/Services Living arrangements for the past 2  months: Single Family Home Lives with:: Self Patient language and need for interpreter reviewed:: Yes Do you feel safe going back to the place where you live?: Yes      Need for Family Participation in Patient Care: Yes (Comment) (daughter) Care giver support system in place?: Yes (comment)   Criminal Activity/Legal Involvement Pertinent to Current Situation/Hospitalization: No - Comment as needed  Activities of Daily Living      Permission Sought/Granted                  Emotional Assessment   Attitude/Demeanor/Rapport: Engaged Affect (typically observed): Calm Orientation: : Oriented to Place, Oriented to Self, Oriented to  Time, Oriented to Situation Alcohol / Substance Use: Not Applicable Psych Involvement: No (comment)  Admission diagnosis:  Acute respiratory failure with hypoxia (HCC) [J96.01] Acute hypoxemic respiratory failure due to COVID-19 (HCC) [U07.1, J96.01] Pneumonia due to COVID-19 virus [U07.1, J12.82] Patient Active Problem List   Diagnosis Date Noted  . Hyponatremia 09/03/2020  . Acute hypoxemic respiratory failure due to COVID-19 [facility]) 09/11/2020   PCP:  Venetia Constable, MD Pharmacy:   [facility] - [place], [place] - [address] [facility] [address] [place] [address] Phone: [phone] Fax: [phone]     Social Determinants of Health (SDOH) Interventions    Readmission Risk Interventions No flowsheet data found.

## 2020-09-04 NOTE — Procedures (Signed)
Intubation Procedure Note  Tammy Ericsson  [id number]  Mar 01, 1961  Date:09/04/20  Time:7:52 PM   Provider Performing:Adalis Gatti P Chestine Spore    Procedure: Intubation (31500)  Indication(s) Respiratory Failure  Consent Unable to obtain consent due to emergent nature of procedure.   Anesthesia Versed, Fentanyl and Rocuronium   Time Out Verified patient identification, verified procedure, site/side was marked, verified correct patient position, special equipment/implants available, medications/allergies/relevant history reviewed, required imaging and test results available.   Sterile Technique Usual hand hygeine, masks, and gloves were used   Procedure Description Patient positioned in bed supine.  Sedation given as noted above.  Patient was intubated with endotracheal tube using Glidescope.  View was Grade 1 full glottis .  Number of attempts was 1.  Colorimetric CO2 detector was consistent with tracheal placement.   Complications/Tolerance desaturation into 30s Chest X-ray is ordered to verify placement.   EBL 0   Specimen(s) None  Steffanie Dunn, DO 09/04/20 7:52 PM [facility]

## 2020-09-04 NOTE — Plan of Care (Signed)
Patient upto chair x2 assist for 2.5 hrs this afternoon.  Wife updated 3x today Daughter updated 3x today  Educated on importance of trying to get OOB to chair each day.  Plan to try Bipap tonight. Repeat chest xray 12/7 0600 Patient still on 60L heated HFNC @ 100%   **continue to give chocolate Ensures

## 2020-09-04 NOTE — Progress Notes (Addendum)
Patient pulled off all oxygen, saturations were undetectably low, <40%. Saturations recovered with BM. Decision made to intubate given life threatening desaturation and concern for confusion from respiratory failure. High risk for cardiac arrest if this were to happen again. Called wife who was able to talk to him before intubation.  Steffanie Dunn, DO 09/04/20 7:52 PM [facility]  Wife updated over the phone.  Steffanie Dunn, DO 09/04/20 7:57 PM [facility]

## 2020-09-04 NOTE — Progress Notes (Addendum)
[facility] PROGRESS NOTE    Progress Note  Donald Rowe  ZOX:[id number] DOB: [birth year] DOA: 09/23/2020 PCP: Venetia Constable, MD     Brief Narrative:   Donald Rowe is an 59 y.o. male past medical history of hypothyroidism hyperlipidemia diagnosed with COVID-19 1 week prior to admission not vaccinated comes to the ED satting 78% on room air was placed on 6 L of oxygen CT angio of the chest was done that negative for PE but it did show bilateral infiltrates.  Assessment/Plan:   Acute hypoxemic respiratory failure due to COVID-19 (HCC)/leading to ARDS: He on heated non-rebreather and high flow flow rate of 45 which is an improvement for yesterday. He has defervesced, his inflammatory markers are persistently elevated. Continue IV remdesivir, steroids and oral baricitinib. His white blood cell is increasing likely due to steroids. procal is low yield discontinue antibiotics. He looks much better today than yesterday, try to keep the patient prone for at least 16 hours a day if not prone out of bed to chair. Monitor fever curve.   Mild hypovolemic hyponatremia: Continue IV fluids sodium is improving slowly.   DVT prophylaxis: lovenox Family Communication:none Status is: Inpatient  Remains inpatient appropriate because:Hemodynamically unstable   Dispo: The patient is from: Home              Anticipated d/c is to: Home              Anticipated d/c date is: > 3 days              Patient currently is not medically stable to d/c. Code Status:     Code Status Orders  (From admission, onward)         Start     Ordered   09/13/2020 2319  Do not attempt resuscitation (DNR)  Continuous       Question Answer Comment  In the event of cardiac or respiratory ARREST Do not call a "code blue"   In the event of cardiac or respiratory ARREST Do not perform Intubation, CPR, defibrillation or ACLS   In the event of cardiac or respiratory ARREST Use medication by any  route, position, wound care, and other measures to relive pain and suffering. May use oxygen, suction and manual treatment of airway obstruction as needed for comfort.      09/23/2020 2319        Code Status History    Date Active Date Inactive Code Status Order ID Comments User Context   09/22/2020 2127 09/21/2020 2319 Full Code [id number]  Charlane Ferretti, DO ED   Advance Care Planning Activity        IV Access:    Peripheral IV   Procedures and diagnostic studies:   CT Angio Chest PE W/Cm &/Or Wo Cm  Result Date: 09/13/2020 CLINICAL DATA:  59 year old with shortness of breath and COVID-19 positive. EXAM: CT ANGIOGRAPHY CHEST WITH CONTRAST TECHNIQUE: Multidetector CT imaging of the chest was performed using the standard protocol during bolus administration of intravenous contrast. Multiplanar CT image reconstructions and MIPs were obtained to evaluate the vascular anatomy. CONTRAST:  OMNIPAQUE IOHEXOL 350 MG/ML SOLN COMPARISON:  Chest radiograph 09/01/2020 FINDINGS: Cardiovascular: Main pulmonary arteries are patent. Unfortunately, limited evaluation of the pulmonary arteries beyond the main pulmonary arteries due to extensive motion artifact and surrounding parenchymal lung disease. Heart size is normal. No significant pericardial effusion. There are coronary artery calcifications. Mediastinum/Nodes: Small lymph nodes in the mediastinum but no significant chest lymphadenopathy. No  axillary lymph node enlargement. Lungs/Pleura: Trachea and mainstem bronchi are patent. Extensive airspace disease and consolidation throughout both lungs and involving all of the lobes. Parenchymal disease is slightly patchy. No significant pleural fluid. Upper Abdomen: Images of the upper abdomen are unremarkable. Musculoskeletal: No acute abnormality. Review of the MIP images confirms the above findings. IMPRESSION: 1. Extensive airspace disease involving both lungs. Findings are compatible with bilateral  pneumonia and likely related to COVID-19. 2. Limited evaluation for pulmonary emboli due to technical limitations. No evidence for a large pulmonary embolism involving the main pulmonary arteries as described. Electronically Signed   By: Richarda Overlie M.D.   On: 09/25/2020 14:57   DG Chest Port 1 View  Result Date: 09/17/2020 CLINICAL DATA:  Low oxygen saturation. Body aches and generalized weakness with shortness of breath for 1 week. Positive COVID-19 test on 08/26/2020. EXAM: PORTABLE CHEST 1 VIEW COMPARISON:  None. FINDINGS: There are bilateral hazy airspace lung opacities, in the mid and lower lungs, consistent with multifocal COVID-19 pneumonia. No convincing pleural effusion.  No pneumothorax. Cardiac silhouette is normal in size. No mediastinal or hilar masses. Skeletal structures are grossly intact. IMPRESSION: 1. Bilateral airspace lung opacities consistent with multifocal COVID-19 pneumonia. Electronically Signed   By: Amie Portland M.D.   On: 09/13/2020 12:24   VAS [place] LOWER EXTREMITY VENOUS (DVT) (ONLY [facility] & [facility])  Result Date: 09/09/2020  Lower Venous DVT Study Indications: COVID pneumonia, dyspnea, edema.  Comparison Study: No prior study Performing Technologist: Gertie Fey MHA, RDMS, RVT, RDCS  Examination Guidelines: A complete evaluation includes B-mode imaging, spectral Doppler, color Doppler, and power Doppler as needed of all accessible portions of each vessel. Bilateral testing is considered an integral part of a complete examination. Limited examinations for reoccurring indications may be performed as noted. The reflux portion of the exam is performed with the patient in reverse Trendelenburg.  +---------+---------------+---------+-----------+----------+--------------+ RIGHT    CompressibilityPhasicitySpontaneityPropertiesThrombus Aging +---------+---------------+---------+-----------+----------+--------------+ CFV      Full           Yes      Yes                                  +---------+---------------+---------+-----------+----------+--------------+ SFJ      Full                                                        +---------+---------------+---------+-----------+----------+--------------+ FV Prox  Full                                                        +---------+---------------+---------+-----------+----------+--------------+ FV Mid   Full                                                        +---------+---------------+---------+-----------+----------+--------------+ FV DistalFull                                                        +---------+---------------+---------+-----------+----------+--------------+  PFV      Full                                                        +---------+---------------+---------+-----------+----------+--------------+ POP      Full           Yes      Yes                                 +---------+---------------+---------+-----------+----------+--------------+ PTV      Full                                                        +---------+---------------+---------+-----------+----------+--------------+ PERO     Full                                                        +---------+---------------+---------+-----------+----------+--------------+   Right Technical Findings: Not visualized segments include Limited evaluation of calf veins.  +---------+---------------+---------+-----------+----------+--------------+ LEFT     CompressibilityPhasicitySpontaneityPropertiesThrombus Aging +---------+---------------+---------+-----------+----------+--------------+ CFV      Full           Yes      Yes                                 +---------+---------------+---------+-----------+----------+--------------+ SFJ      Full                                                        +---------+---------------+---------+-----------+----------+--------------+ FV Prox  Full                                                         +---------+---------------+---------+-----------+----------+--------------+ FV Mid   Full                                                        +---------+---------------+---------+-----------+----------+--------------+ FV DistalFull                                                        +---------+---------------+---------+-----------+----------+--------------+ PFV      Full                                                        +---------+---------------+---------+-----------+----------+--------------+  POP      Full           Yes      Yes                                 +---------+---------------+---------+-----------+----------+--------------+ PTV      Full                                                        +---------+---------------+---------+-----------+----------+--------------+ PERO     Full                                                        +---------+---------------+---------+-----------+----------+--------------+   Left Technical Findings: Not visualized segments include Limited evaluation of calf veins.   Summary: RIGHT: - There is no evidence of deep vein thrombosis in the lower extremity.  - No cystic structure found in the popliteal fossa.  LEFT: - There is no evidence of deep vein thrombosis in the lower extremity.  - No cystic structure found in the popliteal fossa.  *See table(s) above for measurements and observations. Electronically signed by Coral Else MD on 2020-09-23 at 5:31:50 PM.    Final      Medical Consultants:    None.  Anti-Infectives:   IV remdesivir and baricitinib  Subjective:    Donald Rowe relates his breathing is about the same compared to yesterday.  Objective:    Vitals:   09/04/20 0335 09/04/20 0400 09/04/20 0500 09/04/20 0600  BP:  (!) 148/54 (!) 157/79 (!) 151/73  Pulse:  85 98 97  Resp:  (!) 36 (!) 32 (!) 36  Temp:  97.7 F (36.5 C)     TempSrc:  Axillary    SpO2: 94% 92% 92% 97%  Weight:      Height:       SpO2: 97 % O2 Flow Rate (L/min): 45 L/min FiO2 (%): 100 %   Intake/Output Summary (Last 24 hours) at 09/04/2020 0730 Last data filed at 09/04/2020 0223 Gross per 24 hour  Intake 1387.72 ml  Output 825 ml  Net 562.72 ml   Filed Weights   09/03/20 1700  Weight: 101.9 kg    Exam: General exam: In no acute distress. Respiratory system: Good air movement and clear to auscultation. Cardiovascular system: S1 & S2 heard, RRR. No JVD. Gastrointestinal system: Abdomen is nondistended, soft and nontender.  Extremities: No pedal edema. Skin: No rashes, lesions or ulcers Psychiatry: Judgement and insight appear normal. Mood & affect appropriate.  Data Reviewed:    Labs: Basic Metabolic Panel: Recent Labs  Lab 09/23/2020 1242 2020/09/23 1242 09/03/20 0143 09/04/20 0255  NA 133*  --  134* 136  K 4.4   < > 3.9 4.1  CL 99  --  100 104  CO2 24  --  23 22  GLUCOSE 107*  --  118* 140*  BUN 14  --  15 23*  CREATININE 1.05  --  1.08 0.88  CALCIUM 8.6*  --  8.4* 8.2*  MG  --   --  1.8  --   PHOS  --   --  1.4*  --    < > = values in this interval not displayed.   GFR Estimated Creatinine Clearance: 106.4 mL/min (by C-G formula based on SCr of 0.88 mg/dL). Liver Function Tests: Recent Labs  Lab 09/23/2020 1242 09/03/20 0143 09/04/20 0255  AST 68* 96* 98*  ALT 44 53* 57*  ALKPHOS 118 140* 114  BILITOT 0.9 0.8 0.9  PROT 6.5 6.3* 5.4*  ALBUMIN 3.3* 3.2* 2.7*   No results for input(s): LIPASE, AMYLASE in the last 168 hours. No results for input(s): AMMONIA in the last 168 hours. Coagulation profile No results for input(s): INR, PROTIME in the last 168 hours. COVID-19 Labs  Recent Labs    09/09/2020 1242 09/03/20 0143 09/04/20 0255  DDIMER 1.55* 2.57* 2.14*  FERRITIN 902* 1,218*  --   LDH 525*  --   --   CRP 8.3* 11.0* 11.6*    Lab Results  Component Value Date   SARSCOV2NAA POSITIVE (A)  09/15/2020    CBC: Recent Labs  Lab 08/30/2020 1242 09/03/20 0143 09/04/20 0255  WBC 8.1 11.6* 17.6*  NEUTROABS 6.1 9.7* 14.6*  HGB 15.2 14.8 14.2  HCT 44.4 43.9 42.3  MCV 91.5 91.8 91.8  PLT 139* 150 150   Cardiac Enzymes: No results for input(s): CKTOTAL, CKMB, CKMBINDEX, TROPONINI in the last 168 hours. BNP (last 3 results) No results for input(s): PROBNP in the last 8760 hours. CBG: No results for input(s): GLUCAP in the last 168 hours. D-Dimer: Recent Labs    09/03/20 0143 09/04/20 0255  DDIMER 2.57* 2.14*   Hgb A1c: No results for input(s): HGBA1C in the last 72 hours. Lipid Profile: Recent Labs    09/01/2020 1242  TRIG 101   Thyroid function studies: No results for input(s): TSH, T4TOTAL, T3FREE, THYROIDAB in the last 72 hours.  Invalid input(s): FREET3 Anemia work up: Recent Labs    09/16/2020 1242 09/03/20 0143  FERRITIN 902* 1,218*   Sepsis Labs: Recent Labs  Lab 09/05/2020 1242 09/09/2020 2247 09/03/20 0143 09/04/20 0255  PROCALCITON 0.18  --  0.57  --   WBC 8.1  --  11.6* 17.6*  LATICACIDVEN 1.5 2.2* 2.1*  2.1*  --    Microbiology Recent Results (from the past 240 hour(s))  Resp Panel by RT-PCR (Flu A&B, Covid) Nasopharyngeal Swab     Status: Abnormal   Collection Time: 09/25/2020 12:42 PM   Specimen: Nasopharyngeal Swab; Nasopharyngeal(NP) swabs in vial transport medium  Result Value Ref Range Status   SARS Coronavirus 2 by RT PCR POSITIVE (A) NEGATIVE Final    Comment: RESULT CALLED TO, READ BACK BY AND VERIFIED WITH: HENDERLY, B PA 1522 09/01/2020 JM (NOTE) SARS-CoV-2 target nucleic acids are DETECTED.  The SARS-CoV-2 RNA is generally detectable in upper respiratory specimens during the acute phase of infection. Positive results are indicative of the presence of the identified virus, but do not rule out bacterial infection or co-infection with other pathogens not detected by the test. Clinical correlation with patient history and other  diagnostic information is necessary to determine patient infection status. The expected result is Negative.  Fact Sheet for Patients: BloggerCourse.com  Fact Sheet for Healthcare Providers: SeriousBroker.it  This test is not yet approved or cleared by the [place] FDA and  has been authorized for detection and/or diagnosis of SARS-CoV-2 by FDA under an Emergency Use Authorization (EUA).  This EUA will remain in effect (meaning this test can be [place] ed) for the duration of  the COVID-19 declaration under Section  564(b)(1) of the Act, 21 U.S.C. section 360bbb-3(b)(1), unless the authorization is terminated or revoked sooner.     Influenza A by PCR NEGATIVE NEGATIVE Final   Influenza B by PCR NEGATIVE NEGATIVE Final    Comment: (NOTE) The Xpert Xpress SARS-CoV-2/FLU/RSV plus assay is intended as an aid in the diagnosis of influenza from Nasopharyngeal swab specimens and should not be used as a sole basis for treatment. Nasal washings and aspirates are unacceptable for Xpert Xpress SARS-CoV-2/FLU/RSV testing.  Fact Sheet for Patients: BloggerCourse.com  Fact Sheet for Healthcare Providers: SeriousBroker.it  This test is not yet approved or cleared by the [place] FDA and has been authorized for detection and/or diagnosis of SARS-CoV-2 by FDA under an Emergency Use Authorization (EUA). This EUA will remain in effect (meaning this test can be used) for the duration of the COVID-19 declaration under Section 564(b)(1) of the Act, 21 U.S.C. section 360bbb-3(b)(1), unless the authorization is terminated or revoked.  Performed at [facility], [address] [place], [place] [address]   Blood Culture (routine x 2)     Status: None (Preliminary result)   Collection Time: 09-21-2020 12:42 PM   Specimen: BLOOD  Result Value Ref Range Status   Specimen  Description   Final    BLOOD LEFT ANTECUBITAL Performed at [facility], [address], [place], [place] [address]    Special Requests   Final    BOTTLES DRAWN AEROBIC AND ANAEROBIC Blood Culture adequate volume Performed at [facility], [address] [place], [place] [address]    Culture   Final    NO GROWTH < 24 HOURS Performed at [facility], [address], [place], [place] [address]    Report Status PENDING  Incomplete  Blood Culture (routine x 2)     Status: None (Preliminary result)   Collection Time: 2020-09-21 12:42 PM   Specimen: BLOOD LEFT HAND  Result Value Ref Range Status   Specimen Description   Final    BLOOD LEFT HAND Performed at [facility], [address] [place], [place] [address]    Special Requests   Final    BOTTLES DRAWN AEROBIC AND ANAEROBIC Blood Culture adequate volume Performed at [facility], [address], [place], [place] [address]    Culture   Final    NO GROWTH < 24 HOURS Performed at [facility], [address], [place], [place] [address]    Report Status PENDING  Incomplete  MRSA PCR Screening     Status: None   Collection Time: 09/03/20  1:00 PM   Specimen: Nasopharyngeal  Result Value Ref Range Status   MRSA by PCR NEGATIVE NEGATIVE Final    Comment:        The GeneXpert MRSA Assay (FDA approved for NASAL specimens only), is one component of a comprehensive MRSA colonization surveillance program. It is not intended to diagnose MRSA infection nor to guide or monitor treatment for MRSA infections. Performed at [facility], [address] [place], [place] [address]      Medications:   . aspirin  81 mg Oral Daily  . baricitinib  4 mg Oral Daily  . chlorhexidine  15 mL Mouth Rinse BID  . Chlorhexidine Gluconate Cloth  6 each Topical Daily  . cholecalciferol  1,000 Units Oral Daily  . enoxaparin (LOVENOX)  injection  40 mg Subcutaneous Q24H  . levothyroxine  50 mcg Oral Q0600  .  mouth rinse  15 mL Mouth Rinse q12n4p  . methylPREDNISolone (SOLU-MEDROL) injection  80 mg Intravenous Q12H  . pravastatin  40 mg Oral q1800  . sodium chloride  1-2 spray Each Nare Q2H   Continuous Infusions: . sodium chloride Stopped (09/04/20 0203)  . azithromycin Stopped (09/04/20 0303)  . cefTRIAXone (ROCEPHIN)  IV Stopped (09/04/20 0049)  . remdesivir 100 mg in NS 100 mL Stopped (09/03/20 1049)      LOS: 2 days   Marinda Elk  [facility]  09/04/2020, 7:30 AM

## 2020-09-05 ENCOUNTER — Encounter (HOSPITAL_COMMUNITY): Payer: Self-pay

## 2020-09-05 ENCOUNTER — Inpatient Hospital Stay (HOSPITAL_COMMUNITY): Payer: HRSA Program

## 2020-09-05 DIAGNOSIS — J9602 Acute respiratory failure with hypercapnia: Secondary | ICD-10-CM

## 2020-09-05 DIAGNOSIS — U071 COVID-19: Secondary | ICD-10-CM | POA: Diagnosis not present

## 2020-09-05 DIAGNOSIS — R7401 Elevation of levels of liver transaminase levels: Secondary | ICD-10-CM | POA: Diagnosis not present

## 2020-09-05 DIAGNOSIS — Z9911 Dependence on respirator [ventilator] status: Secondary | ICD-10-CM

## 2020-09-05 DIAGNOSIS — J80 Acute respiratory distress syndrome: Secondary | ICD-10-CM | POA: Diagnosis not present

## 2020-09-05 DIAGNOSIS — R579 Shock, unspecified: Secondary | ICD-10-CM

## 2020-09-05 DIAGNOSIS — J9601 Acute respiratory failure with hypoxia: Secondary | ICD-10-CM | POA: Diagnosis not present

## 2020-09-05 LAB — GLUCOSE, CAPILLARY
Glucose-Capillary: 153 mg/dL — ABNORMAL HIGH (ref 70–99)
Glucose-Capillary: 177 mg/dL — ABNORMAL HIGH (ref 70–99)
Glucose-Capillary: 186 mg/dL — ABNORMAL HIGH (ref 70–99)
Glucose-Capillary: 205 mg/dL — ABNORMAL HIGH (ref 70–99)

## 2020-09-05 LAB — COMPREHENSIVE METABOLIC PANEL
ALT: 51 U/L — ABNORMAL HIGH (ref 0–44)
AST: 71 U/L — ABNORMAL HIGH (ref 15–41)
Albumin: 2.5 g/dL — ABNORMAL LOW (ref 3.5–5.0)
Alkaline Phosphatase: 116 U/L (ref 38–126)
Anion gap: 9 (ref 5–15)
BUN: 31 mg/dL — ABNORMAL HIGH (ref 6–20)
CO2: 24 mmol/L (ref 22–32)
Calcium: 8.3 mg/dL — ABNORMAL LOW (ref 8.9–10.3)
Chloride: 104 mmol/L (ref 98–111)
Creatinine, Ser: 0.96 mg/dL (ref 0.61–1.24)
GFR, Estimated: >60 mL/min (ref >60)
Glucose, Bld: 171 mg/dL — ABNORMAL HIGH (ref 70–99)
Potassium: 4.4 mmol/L (ref 3.5–5.1)
Sodium: 137 mmol/L (ref 135–145)
Total Bilirubin: 0.8 mg/dL (ref 0.3–1.2)
Total Protein: 5.2 g/dL — ABNORMAL LOW (ref 6.5–8.1)

## 2020-09-05 LAB — BLOOD GAS, ARTERIAL
Acid-Base Excess: 0.6 mmol/L (ref 0.0–2.0)
Acid-base deficit: 5.4 mmol/L — ABNORMAL HIGH (ref 0.0–2.0)
Bicarbonate: 26.4 mmol/L (ref 20.0–28.0)
Bicarbonate: 24.6 mmol/L (ref 20.0–28.0)
Drawn by: 270211
FIO2: 100
MECHVT: 420 mL
O2 Saturation: 86.2 %
O2 Saturation: 98.6 %
PEEP: 12 cmH2O
Patient temperature: 98.6
Patient temperature: 98.6
RATE: 35 resp/min
pCO2 arterial: 49.9 mmHg — ABNORMAL HIGH (ref 32.0–48.0)
pCO2 arterial: 70.8 mmHg (ref 32.0–48.0)
pH, Arterial: 7.344 — ABNORMAL LOW (ref 7.350–7.450)
pH, Arterial: 7.167 — CL (ref 7.350–7.450)
pO2, Arterial: 56 mmHg — ABNORMAL LOW (ref 83.0–108.0)
pO2, Arterial: 162 mmHg — ABNORMAL HIGH (ref 83.0–108.0)

## 2020-09-05 LAB — CBC WITH DIFFERENTIAL/PLATELET
Abs Immature Granulocytes: 0.25 10*3/uL — ABNORMAL HIGH (ref 0.00–0.07)
Basophils Absolute: 0 10*3/uL (ref 0.0–0.1)
Basophils Relative: 0 %
Eosinophils Absolute: 0 10*3/uL (ref 0.0–0.5)
Eosinophils Relative: 0 %
HCT: 40.8 % (ref 39.0–52.0)
Hemoglobin: 13.5 g/dL (ref 13.0–17.0)
Immature Granulocytes: 1 %
Lymphocytes Relative: 8 %
Lymphs Abs: 1.5 10*3/uL (ref 0.7–4.0)
MCH: 30.6 pg (ref 26.0–34.0)
MCHC: 33.1 g/dL (ref 30.0–36.0)
MCV: 92.5 fL (ref 80.0–100.0)
Monocytes Absolute: 0.7 10*3/uL (ref 0.1–1.0)
Monocytes Relative: 4 %
Neutro Abs: 15 10*3/uL — ABNORMAL HIGH (ref 1.7–7.7)
Neutrophils Relative %: 87 %
Platelets: 152 10*3/uL (ref 150–400)
RBC: 4.41 MIL/uL (ref 4.22–5.81)
RDW: 13 % (ref 11.5–15.5)
WBC: 17.4 10*3/uL — ABNORMAL HIGH (ref 4.0–10.5)
nRBC: 0 % (ref 0.0–0.2)

## 2020-09-05 LAB — PROCALCITONIN: Procalcitonin: 3.42 ng/mL

## 2020-09-05 LAB — D-DIMER, QUANTITATIVE: D-Dimer, Quant: >20 ug/mL-FEU — ABNORMAL HIGH (ref 0.00–0.50)

## 2020-09-05 LAB — C-REACTIVE PROTEIN: CRP: 5.8 mg/dL — ABNORMAL HIGH (ref <1.0)

## 2020-09-05 MED ORDER — OXYCODONE HCL 5 MG/5ML PO SOLN
5.0000 mg | Freq: Four times a day (QID) | ORAL | Status: DC
  Administered 2020-09-05 – 2020-09-15 (×38): 5 mg
  Filled 2020-09-05 (×37): qty 5

## 2020-09-05 MED ORDER — VITAL HIGH PROTEIN PO LIQD: 1000.0000 mL | ORAL | Status: DC

## 2020-09-05 MED ORDER — NOREPINEPHRINE 4 MG/250ML-% IV SOLN
INTRAVENOUS | Status: AC
  Administered 2020-09-05: 5 ug/min via INTRAVENOUS
  Filled 2020-09-05: qty 250

## 2020-09-05 MED ORDER — FENTANYL 2500MCG IN NS 250ML (10MCG/ML) PREMIX INFUSION
0.0000 ug/h | INTRAVENOUS | Status: DC
  Administered 2020-09-05: 250 ug/h via INTRAVENOUS
  Administered 2020-09-05: 200 ug/h via INTRAVENOUS
  Administered 2020-09-06 (×3): 250 ug/h via INTRAVENOUS
  Administered 2020-09-07: 50 ug/h via INTRAVENOUS
  Administered 2020-09-08: 250 ug/h via INTRAVENOUS
  Administered 2020-09-08: 200 ug/h via INTRAVENOUS
  Administered 2020-09-09: 21:00:00 250 ug/h via INTRAVENOUS
  Administered 2020-09-09: 12:00:00 150 ug/h via INTRAVENOUS
  Administered 2020-09-10: 16:00:00 75 ug/h via INTRAVENOUS
  Administered 2020-09-11: 05:00:00 150 ug/h via INTRAVENOUS
  Administered 2020-09-11 – 2020-09-12 (×3): 200 ug/h via INTRAVENOUS
  Administered 2020-09-13 – 2020-09-14 (×2): 150 ug/h via INTRAVENOUS
  Administered 2020-09-14 – 2020-09-18 (×20): 400 ug/h via INTRAVENOUS
  Administered 2020-09-19: 13:00:00 375 ug/h via INTRAVENOUS
  Administered 2020-09-19: 06:00:00 400 ug/h via INTRAVENOUS
  Administered 2020-09-19: 20:00:00 325 ug/h via INTRAVENOUS
  Administered 2020-09-20: 22:00:00 250 ug/h via INTRAVENOUS
  Administered 2020-09-20: 04:00:00 325 ug/h via INTRAVENOUS
  Administered 2020-09-20 – 2020-09-21 (×2): 250 ug/h via INTRAVENOUS
  Administered 2020-09-22 – 2020-09-24 (×3): 100 ug/h via INTRAVENOUS
  Administered 2020-09-24: 22:00:00 150 ug/h via INTRAVENOUS
  Administered 2020-09-24: 15:00:00 300 ug/h via INTRAVENOUS
  Administered 2020-09-25: 06:00:00 275 ug/h via INTRAVENOUS
  Administered 2020-09-25: 14:00:00 300 ug/h via INTRAVENOUS
  Administered 2020-09-26: 200 ug/h via INTRAVENOUS
  Administered 2020-09-26: 20:00:00 250 ug/h via INTRAVENOUS
  Administered 2020-09-26: 10:00:00 200 ug/h via INTRAVENOUS
  Administered 2020-09-27: 18:00:00 225 ug/h via INTRAVENOUS
  Administered 2020-09-28 (×2): 275 ug/h via INTRAVENOUS
  Administered 2020-09-30 – 2020-10-02 (×3): 100 ug/h via INTRAVENOUS
  Administered 2020-10-03: 50 ug/h via INTRAVENOUS
  Administered 2020-10-04: 23:00:00 100 ug/h via INTRAVENOUS
  Administered 2020-10-06: 25 ug/h via INTRAVENOUS
  Filled 2020-09-05 (×63): qty 250

## 2020-09-05 MED ORDER — CLONAZEPAM 1 MG PO TABS
2.0000 mg | ORAL_TABLET | Freq: Two times a day (BID) | ORAL | Status: DC
  Administered 2020-09-05 – 2020-09-07 (×6): 2 mg
  Filled 2020-09-05 (×6): qty 2

## 2020-09-05 MED ORDER — ENOXAPARIN SODIUM 40 MG/0.4ML ~~LOC~~ SOLN
40.0000 mg | Freq: Two times a day (BID) | SUBCUTANEOUS | Status: DC
  Administered 2020-09-05 (×2): 40 mg via SUBCUTANEOUS
  Filled 2020-09-05 (×2): qty 0.4

## 2020-09-05 MED ORDER — SODIUM CHLORIDE 0.9 % IV SOLN
0.0000 ug/kg/min | INTRAVENOUS | Status: DC
  Administered 2020-09-05 – 2020-09-06 (×3): 3 ug/kg/min via INTRAVENOUS
  Filled 2020-09-05 (×5): qty 20

## 2020-09-05 MED ORDER — SODIUM CHLORIDE 0.9 % IV SOLN
2.0000 g | Freq: Three times a day (TID) | INTRAVENOUS | Status: AC
  Administered 2020-09-05 – 2020-09-12 (×20): 2 g via INTRAVENOUS
  Filled 2020-09-05 (×20): qty 2

## 2020-09-05 MED ORDER — FREE WATER
200.0000 mL | Status: DC
  Administered 2020-09-05 – 2020-09-08 (×15): 200 mL

## 2020-09-05 MED ORDER — PROPOFOL 1000 MG/100ML IV EMUL
25.0000 ug/kg/min | INTRAVENOUS | Status: DC
  Administered 2020-09-05: 50 ug/kg/min via INTRAVENOUS
  Administered 2020-09-05: 30 ug/kg/min via INTRAVENOUS
  Administered 2020-09-05: 50 ug/kg/min via INTRAVENOUS
  Administered 2020-09-05: 45 ug/kg/min via INTRAVENOUS
  Administered 2020-09-05 – 2020-09-06 (×2): 50 ug/kg/min via INTRAVENOUS
  Administered 2020-09-06: 15 ug/kg/min via INTRAVENOUS
  Administered 2020-09-06: 25 ug/kg/min via INTRAVENOUS
  Administered 2020-09-06 (×3): 50 ug/kg/min via INTRAVENOUS
  Administered 2020-09-07: 20 ug/kg/min via INTRAVENOUS
  Administered 2020-09-07: 25 ug/kg/min via INTRAVENOUS
  Administered 2020-09-08: 30 ug/kg/min via INTRAVENOUS
  Administered 2020-09-08: 25 ug/kg/min via INTRAVENOUS
  Filled 2020-09-05 (×16): qty 100

## 2020-09-05 MED ORDER — VITAL HIGH PROTEIN PO LIQD
1000.0000 mL | ORAL | Status: DC
  Administered 2020-09-05 – 2020-09-06 (×4): 1000 mL
  Filled 2020-09-05 (×2): qty 1000

## 2020-09-05 MED ORDER — CISATRACURIUM BOLUS VIA INFUSION
10.0000 mg | Freq: Once | INTRAVENOUS | Status: AC
  Administered 2020-09-05: 10 mg via INTRAVENOUS
  Filled 2020-09-05: qty 10

## 2020-09-05 MED ORDER — INSULIN ASPART 100 UNIT/ML ~~LOC~~ SOLN
0.0000 [IU] | SUBCUTANEOUS | Status: DC
  Administered 2020-09-05 (×2): 5 [IU] via SUBCUTANEOUS
  Administered 2020-09-05: 3 [IU] via SUBCUTANEOUS
  Administered 2020-09-06: 5 [IU] via SUBCUTANEOUS

## 2020-09-05 MED ORDER — ARTIFICIAL TEARS OPHTHALMIC OINT
1.0000 "application " | TOPICAL_OINTMENT | Freq: Three times a day (TID) | OPHTHALMIC | Status: DC
  Administered 2020-09-05 – 2020-09-07 (×7): 1 via OPHTHALMIC
  Filled 2020-09-05 (×3): qty 3.5

## 2020-09-05 MED ORDER — CISATRACURIUM BOLUS VIA INFUSION
0.1000 mg/kg | Freq: Once | INTRAVENOUS | Status: DC
  Filled 2020-09-05: qty 11

## 2020-09-05 MED ORDER — PANTOPRAZOLE SODIUM 40 MG IV SOLR
40.0000 mg | INTRAVENOUS | Status: DC
  Administered 2020-09-05 – 2020-09-07 (×3): 40 mg via INTRAVENOUS
  Filled 2020-09-05 (×3): qty 40

## 2020-09-05 MED ORDER — NOREPINEPHRINE 4 MG/250ML-% IV SOLN
0.0000 ug/min | INTRAVENOUS | Status: DC
  Administered 2020-09-05: 5 ug/min via INTRAVENOUS
  Administered 2020-09-06: 2 ug/min via INTRAVENOUS
  Filled 2020-09-05 (×2): qty 250

## 2020-09-05 MED ORDER — PROSOURCE TF PO LIQD
45.0000 mL | Freq: Two times a day (BID) | ORAL | Status: DC
  Administered 2020-09-05 – 2020-09-06 (×3): 45 mL
  Filled 2020-09-05 (×2): qty 45

## 2020-09-05 NOTE — TOC Progression Note (Signed)
Transition of Care [facility]) - Progression Note    Patient Details  Name: Makael Stein MRN: [id number] Date of Birth: 12-16-1960  Transition of Care [facility]) CM/SW Contact  Golda Acre, RN Phone Number: 09/05/2020, 8:24 AM  Clinical Narrative:    On vent at 100% Fi02, iv sedation,    Expected Discharge Plan: Home/Self Care Barriers to Discharge: No Barriers Identified  Expected Discharge Plan and Services Expected Discharge Plan: Home/Self Care   Discharge Planning Services: CM Consult   Living arrangements for the past 2 months: Single Family Home                                       Social Determinants of Health (SDOH) Interventions    Readmission Risk Interventions No flowsheet data found.

## 2020-09-05 NOTE — Progress Notes (Signed)
Pharmacy Antibiotic Note  Donald Rowe is a 59 y.o. male with COVID-19 presented to the ED on 09/10/2020 with SOB.  Pharmacy consulted on 12/7 to start cefepime for PNA.  - 12/7 CXR: Extensive bilateral pneumonia with overall mildly improved lung aeration.  - scr 0.96 (crcl~97) - PCT up 3.42  12/5 Ceftriaxone >> 12/6 12/5 Azith >> 12/6 12/7 cefepime>>  12/4 bcx x1:    Plan: - cefepime 2gm IV q8h  ____________________________________  Height: 5\' 9"  (175.3 cm) Weight: 101.9 kg (224 lb 10.4 oz) IBW/kg (Calculated) : 70.7  Temp (24hrs), Avg:97.6 F (36.4 C), Min:96.3 F (35.7 C), Max:98.6 F (37 C)  Recent Labs  Lab 09/05/2020 1242 08/30/2020 2247 09/03/20 0143 09/04/20 0255 09/05/20 0258  WBC 8.1  --  11.6* 17.6* 17.4*  CREATININE 1.05  --  1.08 0.88 0.96  LATICACIDVEN 1.5 2.2* 2.1*  2.1*  --   --     Estimated Creatinine Clearance: 97.5 mL/min (by C-G formula based on SCr of 0.96 mg/dL).    No Known Allergies   Thank you for allowing pharmacy to be a part of this patient's care.  14/07/21 09/05/2020 3:02 PM

## 2020-09-05 NOTE — Procedures (Signed)
Central Venous Catheter Insertion Procedure Note  Donald Rowe  [id number]  1961-01-26  Date:09/05/20  Time:11:23 AM   Provider Performing:Gokul Waybright Demetrius Charity Chestine Spore   Procedure: Insertion of Non-tunneled Central Venous [phone]) with US guidance ([id number])   Indication(s) Medication administration and Difficult access  Consent Risks of the procedure as well as the alternatives and risks of each were explained to the patient and/or caregiver.  Consent for the procedure was obtained and is signed in the bedside chart. Wife provided consent via phone.  Anesthesia Topical only with 1% lidocaine   Timeout Verified patient identification, verified procedure, site/side was marked, verified correct patient position, special equipment/implants available, medications/allergies/relevant history reviewed, required imaging and test results available.  Sterile Technique Maximal sterile technique including full sterile barrier drape, hand hygiene, sterile gown, sterile gloves, mask, hair covering, sterile ultrasound probe cover (if used).  Procedure Description Area of catheter insertion was cleaned with chlorhexidine and draped in sterile fashion.  With real-time ultrasound guidance a central venous catheter was placed into the left internal jugular vein. Nonpulsatile blood flow and guidewire confirmed in compressible vein. Catheter advanced over guidewire and and easy flushing noted in all ports.  The catheter was sutured in place and sterile dressing applied.  Complications/Tolerance None; patient tolerated the procedure well. Chest X-ray is ordered to verify placement for internal jugular or subclavian cannulation.   Chest x-ray is not ordered for femoral cannulation.  EBL Minimal  Specimen(s) None  Steffanie Dunn, DO 09/05/20 11:23 AM [facility]

## 2020-09-05 NOTE — Plan of Care (Signed)
  Problem: Respiratory: Goal: Will maintain a patent airway Outcome: Progressing Goal: Complications related to the disease process, condition or treatment will be avoided or minimized Outcome: Progressing   Problem: Fluid Volume: Goal: Hemodynamic stability will improve Outcome: Progressing   Problem: Clinical Measurements: Goal: Diagnostic test results will improve Outcome: Progressing Goal: Signs and symptoms of infection will decrease Outcome: Progressing   Problem: Respiratory: Goal: Ability to maintain adequate ventilation will improve Outcome: Progressing

## 2020-09-05 NOTE — Progress Notes (Signed)
Initial Nutrition Assessment  RD working remotely.  DOCUMENTATION CODES:   Obesity unspecified  INTERVENTION:  - will order Vital High Protein @ 25 ml/hr to advance by 10 ml every 8 hours to reach goal rate of 45 ml/hr with 45 ml Prosource TF BID.  - at goal rate, this regimen + kcal from current propofol rate will provide 1644 kcal, 116 grams protein, and 903 ml free water. - free water flush to be per CCM.    NUTRITION DIAGNOSIS:   Inadequate oral intake related to inability to eat as evidenced by NPO status.  GOAL:   Provide needs based on ASPEN/SCCM guidelines  MONITOR:   Vent status, TF tolerance, Labs, Weight trends  REASON FOR ASSESSMENT:   Ventilator, Consult  ASSESSMENT:   60 y.o. male with medical history of hypothyroidism, asthma, cancer, CHF, DM, HTN, sickle cell anemia, renal insufficiency, and hyperlipidemia. He is unvaccinated against COVID and was dx with COVID 1 week PTA. EMS was activated d/t SOB and he was found to be 78% on room air.  Patient was intubated yesterday at ~1950. OGT placed and abdominal xray report states "projects over the stomach."   Weight on 12/5 was 225 lb and PTA the most recently documented weight was at [facility] on 10/08/17 when he weighed 224 lb.   Per notes: - COVID PNA with severe ARDS - proning 16 hours/day - plan to start TF today   Patient is currently intubated on ventilator support MV: 17.3 L/min Temp (24hrs), Avg:97.5 F (36.4 C), Min:96.6 F (35.9 C), Max:98.6 F (37 C) Propofol: 18.34 ml/hr (484 kcal)  Labs reviewed; BUN: 31 mg/dl, Ca: 8.3 mg/dl, LFTs elevated but down from 12/6. Medications reviewed; Drips; fentanyl @ 150 mcg/hr, propofol 2 30 mcg/kg/min.     NUTRITION - FOCUSED PHYSICAL EXAM:  unable to complete at this time.  Diet Order:   Diet Order            Diet NPO time specified  Diet effective now                 EDUCATION NEEDS:   Not appropriate for education at this  time  Skin:  Skin Assessment: Reviewed RN Assessment  Last BM:  12/4  Height:   Ht Readings from Last 1 Encounters:  09/03/20 5\' 9"  (1.753 m)    Weight:   Wt Readings from Last 1 Encounters:  09/03/20 101.9 kg    Estimated Nutritional Needs:  Kcal:  1660 kcal (20 kcal/kg adjBW) Protein:  110-120 grams Fluid:  >/= 2.2 L/day      14/05/21, MS, RD, LDN, CNSC Inpatient Clinical Dietitian RD pager # available in AMION  After hours/weekend pager # available in [facility]

## 2020-09-05 NOTE — Progress Notes (Signed)
NAME:  Donald Rowe, MRN:  [id number], DOB:  1961/04/09, LOS: 3 ADMISSION DATE:  09/24/2020, CONSULTATION DATE:  12/6 REFERRING MD:  Dr. David Stall, CHIEF COMPLAINT:  SOB    Brief History   59 y/o M admitted 12/4 with 1 week hx of SOB, known COVID positive.  He is unvaccinated.  CTA chest negative for PE but demonstrated diffuse bilateral infiltrates.   History of present illness   60 y/o unvaccinated male, admitted 12/4 with a one week history of shortness of breath.    The patient began feeling poorly the day before Thanksgiving with fever, weakness and body aches. He tested positive for COVID on 12/27 prior to presentation.  He developed progressive shortness of breath.  In the ER, the patient was found to have saturations of 78%.  He was started on 6L O2 with initial improvement in saturations.  The patient was admitted per [facility]. CTA of the Chest was assessed and negative for pulmonary embolism but showed diffuse bilateral infiltrates. He was treated with IV remdesivir, steroids and baricitinib.  Initially, he was also treated with antibiotics but these were stopped on 12/6 with a low procalcitonin.  LE doppler negative for DVT. The patient elected to be DNR / no intubation.  He developed worsening respiratory failure with hypoxia requiring 60L flow 100% and PCCM consulted 12/6 for evaluation.   Past Medical History  Hypothyroidism  HLD   Significant Hospital Events   12/04 Admit  12/06 PCCM consulted   Consults:  PCCM  Procedures:    Significant Diagnostic Tests:   CTA Chest 12/4 >> extensive bilateral airspace disease, no large PE identified, limited study   LE Venous Duplex 12/4 >> negative for DVT bilaterally   Micro Data:  COVID 12/4 >> negative  Influenza A/B 12/4 >> negative  MRSA PCR 12/5 >> negative  BCx2 12/4 >>   Antimicrobials:  Azithromycin 12/5 >> 12/6  Ceftriaxone 12/5 >> 12/6   Interim history/subjective:  Intubated last evening for hypoxia after  pulling off O2.  Objective   Blood pressure 110/67, pulse 79, temperature 98.6 F (37 C), resp. rate 14, height 5\' 9"  (1.753 m), weight 101.9 kg, SpO2 94 %.    Vent Mode: PRVC FiO2 (%):  [100 %] 100 % Set Rate:  [35 bmp] 35 bmp Vt Set:  [420 mL] 420 mL PEEP:  [12 cmH20] 12 cmH20 Plateau Pressure:  [21 cmH20-28 cmH20] 28 cmH20   Intake/Output Summary (Last 24 hours) at 09/05/2020 0839 Last data filed at 09/05/2020 0800 Gross per 24 hour  Intake 821.88 ml  Output 1310 ml  Net -488.12 ml   Filed Weights   09/03/20 1700  Weight: 101.9 kg    Examination: General: critically ill appearing man laying in bed in NAD, intubated, sedated.   HEENT: [place]/AT, eyes.  Neuro: RASS -5, pinpoint pupils  CV: S1S2, RRR PULM: Tachypnea, moderate vent dyssynchrony. CTAB. No secretions from ETT. GI: soft, NT, ND Extremities: no c/c/e Skin: no rashes or ecchymoses  Resolved Hospital Problem list      Assessment & Plan:   Acute hypoxemic respiratory failure secondary to COVID PNA with severe ARDS. P:F 56 today, but vent not optimized. -LTVV, 4-8cc/kg IBW with goal Pplat <30 and DP<15. Titrate PEEP & FiO2 per ARDS protocol. Permissive hypercapnia. -prone 16 h/day until P:F>150 -con't solumedrol, baricitinib. Ok to complete remdesivir course. -heavy sedation with RASS -5 -NMB for vent synchrony -recheck ABG post proning today -checking PCT; if increasing will start empiric HAP antibiotics -  start TF -VAP prevention protocol -CVC and Aline placement today to facilitate proning; phone consent from wife today.  Elevated d-dimer -LE [place] -no role for repeat PE testing given known cause of deterioration (removing oxygen devices) prior to intubation and negative study within a few days prior -prophylactic AC at this point given poorer outcomes with full dose [facility] prophylacticaly in critically ill patients.  Hyperglycemia -SSI PRN -goal BG 140-180   Elevated transaminases likely 2/2 acute illness,  possibly med side effect. -con't to monitor   Best practice (evaluated daily)  Diet: NPO, TF Pain/Anxiety/Delirium protocol (if indicated) yes VAP protocol (if indicated): yes DVT prophylaxis: lovenox  GI prophylaxis: pantoprazole  Glucose control: SSI Mobility: bedrest Last date of multidisciplinary goals of care discussion: wife updated over the phone 12/7 Family and staff present RN, physicain Summary of discussion con't aggressive care Follow up goals of care discussion due: 12/15 Code Status: full Disposition: ICU  Labs   CBC: Recent Labs  Lab 09/16/20 1242 09/03/20 0143 09/04/20 0255 09/05/20 0258  WBC 8.1 11.6* 17.6* 17.4*  NEUTROABS 6.1 9.7* 14.6* 15.0*  HGB 15.2 14.8 14.2 13.5  HCT 44.4 43.9 42.3 40.8  MCV 91.5 91.8 91.8 92.5  PLT 139* 150 150 152    Basic Metabolic Panel: Recent Labs  Lab 09/16/20 1242 09/03/20 0143 09/04/20 0255 09/05/20 0258  NA 133* 134* 136 137  K 4.4 3.9 4.1 4.4  CL 99 100 104 104  CO2 24 23 22 24   GLUCOSE 107* 118* 140* 171*  BUN 14 15 23* 31*  CREATININE 1.05 1.08 0.88 0.96  CALCIUM 8.6* 8.4* 8.2* 8.3*  MG  --  1.8  --   --   PHOS  --  1.4*  --   --    GFR: Estimated Creatinine Clearance: 97.5 mL/min (by C-G formula based on SCr of 0.96 mg/dL). Recent Labs  Lab September 16, 2020 1242 09/16/20 2247 09/03/20 0143 09/04/20 0255 09/05/20 0258  PROCALCITON 0.18  --  0.57  --   --   WBC 8.1  --  11.6* 17.6* 17.4*  LATICACIDVEN 1.5 2.2* 2.1*  2.1*  --   --     Liver Function Tests: Recent Labs  Lab Sep 16, 2020 1242 09/03/20 0143 09/04/20 0255 09/05/20 0258  AST 68* 96* 98* 71*  ALT 44 53* 57* 51*  ALKPHOS 118 140* 114 116  BILITOT 0.9 0.8 0.9 0.8  PROT 6.5 6.3* 5.4* 5.2*  ALBUMIN 3.3* 3.2* 2.7* 2.5*   No results for input(s): LIPASE, AMYLASE in the last 168 hours. No results for input(s): AMMONIA in the last 168 hours.  ABG    Component Value Date/Time   PHART 7.344 (L) 09/05/2020 0810   PCO2ART 49.9 (H)  09/05/2020 0810   PO2ART 56.0 (L) 09/05/2020 0810   HCO3 26.4 09/05/2020 0810   ACIDBASEDEF 1.3 09/04/2020 2045   O2SAT 86.2 09/05/2020 0810     Coagulation Profile: No results for input(s): INR, PROTIME in the last 168 hours.  Cardiac Enzymes: No results for input(s): CKTOTAL, CKMB, CKMBINDEX, TROPONINI in the last 168 hours.  HbA1C: No results found for: HGBA1C  CBG: Recent Labs  Lab 09/04/20 2107  GLUCAP 153*     This patient is critically ill with multiple organ system failure which requires frequent high complexity decision making, assessment, support, evaluation, and titration of therapies. This was completed through the application of advanced monitoring technologies and extensive interpretation of multiple databases. During this encounter critical care time was devoted to patient care  services described in this note for 50 minutes.  Steffanie Dunn, DO 09/05/20 9:05 AM [facility]

## 2020-09-05 NOTE — Progress Notes (Signed)
proned at 1310

## 2020-09-05 NOTE — Progress Notes (Signed)
eLink Physician-Brief Progress Note Patient Name: Donald Rowe DOB: [birth year] MRN: [id number]   Date of Service  09/05/2020  HPI/Events of Note  Notified of D dimer > 20 CT PE 12/4 negative for PE Patient now intubated and CXR with worsening infiltrates No bleeding reported  eICU Interventions  Possibility of microthrombi more likely in this patient who is positive for COVID Increased enoxaparin to 40 BID. Consider increasing to full dose anticoagulation if without bleed     Intervention Category Intermediate Interventions: Other:  Darl Pikes 09/05/2020, 5:11 AM

## 2020-09-05 NOTE — Procedures (Signed)
Arterial Catheter Insertion Procedure Note  Donald Rowe  [id number]  12-Aug-1961  Date:09/05/20  Time:11:20 AM    Provider Performing: Steffanie Dunn    Procedure: Insertion of Arterial Line (00459) with US guidance (97741)   Indication(s) Blood pressure monitoring and/or need for frequent ABGs  Consent Risks of the procedure as well as the alternatives and risks of each were explained to the patient and/or caregiver.  Consent for the procedure was obtained and is signed in the bedside chart. Wife provided consent via phone.  Anesthesia per [facility]   Time Out Verified patient identification, verified procedure, site/side was marked, verified correct patient position, special equipment/implants available, medications/allergies/relevant history reviewed, required imaging and test results available.   Sterile Technique Maximal sterile technique including full sterile barrier drape, hand hygiene, sterile gown, sterile gloves, mask, hair covering, sterile ultrasound probe cover (if used).   Procedure Description Area of catheter insertion was cleaned with chlorhexidine and draped in sterile fashion. Pulsatile ulnar collateral artery confirmed on the right.  With real-time ultrasound guidance an arterial catheter was placed into the right radial artery.  Appropriate arterial tracings confirmed on monitor.     Complications/Tolerance None; patient tolerated the procedure well.   EBL Minimal   Specimen(s) None  Steffanie Dunn, DO 09/05/20 11:20 AM [facility]

## 2020-09-05 NOTE — Progress Notes (Signed)
Report called to transfer unit 66M & carelink. This Rn helped carelink transfer pt over to carelink stretcher and monitors. Sent on fentanyl, propofol, levophed & nimbex gtt. Pt stable at time of transfer.

## 2020-09-05 NOTE — Progress Notes (Signed)
eLink Physician-Brief Progress Note Patient Name: Marcellis Frampton DOB: 03/01/1961 MRN: [id number]   Date of Service  09/05/2020  HPI/Events of Note  Notified of temp 96.9  eICU Interventions  Warming blanket ordered     Intervention Category Minor Interventions: Other:  Darl Pikes 09/05/2020, 3:07 AM

## 2020-09-06 ENCOUNTER — Encounter (HOSPITAL_COMMUNITY): Admission: EM | Disposition: E | Payer: Self-pay | Source: Home / Self Care | Attending: Critical Care Medicine

## 2020-09-06 ENCOUNTER — Inpatient Hospital Stay (HOSPITAL_COMMUNITY): Payer: HRSA Program

## 2020-09-06 DIAGNOSIS — I428 Other cardiomyopathies: Secondary | ICD-10-CM

## 2020-09-06 DIAGNOSIS — U071 COVID-19: Secondary | ICD-10-CM | POA: Diagnosis not present

## 2020-09-06 DIAGNOSIS — Z01810 Encounter for preprocedural cardiovascular examination: Secondary | ICD-10-CM | POA: Diagnosis not present

## 2020-09-06 DIAGNOSIS — J9601 Acute respiratory failure with hypoxia: Secondary | ICD-10-CM | POA: Diagnosis not present

## 2020-09-06 HISTORY — PX: ECMO CANNULATION: CATH118321

## 2020-09-06 LAB — POCT I-STAT 7, (LYTES, BLD GAS, ICA,H+H)
Acid-Base Excess: 1 mmol/L (ref 0.0–2.0)
Acid-Base Excess: 1 mmol/L (ref 0.0–2.0)
Acid-base deficit: 2 mmol/L (ref 0.0–2.0)
Acid-base deficit: 1 mmol/L (ref 0.0–2.0)
Acid-base deficit: 2 mmol/L (ref 0.0–2.0)
Bicarbonate: 27.4 mmol/L (ref 20.0–28.0)
Bicarbonate: 27.1 mmol/L (ref 20.0–28.0)
Bicarbonate: 27.2 mmol/L (ref 20.0–28.0)
Bicarbonate: 27 mmol/L (ref 20.0–28.0)
Bicarbonate: 26.3 mmol/L (ref 20.0–28.0)
Calcium, Ion: 1.32 mmol/L (ref 1.15–1.40)
Calcium, Ion: 1.26 mmol/L (ref 1.15–1.40)
Calcium, Ion: 1.33 mmol/L (ref 1.15–1.40)
Calcium, Ion: 1.28 mmol/L (ref 1.15–1.40)
Calcium, Ion: 1.27 mmol/L (ref 1.15–1.40)
HCT: 38 % — ABNORMAL LOW (ref 39.0–52.0)
HCT: 32 % — ABNORMAL LOW (ref 39.0–52.0)
HCT: 37 % — ABNORMAL LOW (ref 39.0–52.0)
HCT: 33 % — ABNORMAL LOW (ref 39.0–52.0)
HCT: 33 % — ABNORMAL LOW (ref 39.0–52.0)
Hemoglobin: 12.9 g/dL — ABNORMAL LOW (ref 13.0–17.0)
Hemoglobin: 10.9 g/dL — ABNORMAL LOW (ref 13.0–17.0)
Hemoglobin: 12.6 g/dL — ABNORMAL LOW (ref 13.0–17.0)
Hemoglobin: 11.2 g/dL — ABNORMAL LOW (ref 13.0–17.0)
Hemoglobin: 11.2 g/dL — ABNORMAL LOW (ref 13.0–17.0)
O2 Saturation: 97 %
O2 Saturation: 100 %
O2 Saturation: 87 %
O2 Saturation: 100 %
O2 Saturation: 100 %
Patient temperature: 35
Patient temperature: 35.8
Patient temperature: 36.4
Potassium: 4.7 mmol/L (ref 3.5–5.1)
Potassium: 4.5 mmol/L (ref 3.5–5.1)
Potassium: 4.5 mmol/L (ref 3.5–5.1)
Potassium: 4.4 mmol/L (ref 3.5–5.1)
Potassium: 4.3 mmol/L (ref 3.5–5.1)
Sodium: 142 mmol/L (ref 135–145)
Sodium: 144 mmol/L (ref 135–145)
Sodium: 144 mmol/L (ref 135–145)
Sodium: 144 mmol/L (ref 135–145)
Sodium: 146 mmol/L — ABNORMAL HIGH (ref 135–145)
TCO2: 29 mmol/L (ref 22–32)
TCO2: 29 mmol/L (ref 22–32)
TCO2: 29 mmol/L (ref 22–32)
TCO2: 28 mmol/L (ref 22–32)
TCO2: 28 mmol/L (ref 22–32)
pCO2 arterial: 65.5 mmHg (ref 32.0–48.0)
pCO2 arterial: 54.1 mmHg — ABNORMAL HIGH (ref 32.0–48.0)
pCO2 arterial: 66.4 mmHg (ref 32.0–48.0)
pCO2 arterial: 44.3 mmHg (ref 32.0–48.0)
pCO2 arterial: 40.9 mmHg (ref 32.0–48.0)
pH, Arterial: 7.229 — ABNORMAL LOW (ref 7.350–7.450)
pH, Arterial: 7.22 — ABNORMAL LOW (ref 7.350–7.450)
pH, Arterial: 7.298 — ABNORMAL LOW (ref 7.350–7.450)
pH, Arterial: 7.388 (ref 7.350–7.450)
pH, Arterial: 7.414 (ref 7.350–7.450)
pO2, Arterial: 110 mmHg — ABNORMAL HIGH (ref 83.0–108.0)
pO2, Arterial: 298 mmHg — ABNORMAL HIGH (ref 83.0–108.0)
pO2, Arterial: 65 mmHg — ABNORMAL LOW (ref 83.0–108.0)
pO2, Arterial: 246 mmHg — ABNORMAL HIGH (ref 83.0–108.0)
pO2, Arterial: 206 mmHg — ABNORMAL HIGH (ref 83.0–108.0)

## 2020-09-06 LAB — COMPREHENSIVE METABOLIC PANEL
ALT: 41 U/L (ref 0–44)
AST: 38 U/L (ref 15–41)
Albumin: 2.3 g/dL — ABNORMAL LOW (ref 3.5–5.0)
Alkaline Phosphatase: 107 U/L (ref 38–126)
Anion gap: 7 (ref 5–15)
BUN: 41 mg/dL — ABNORMAL HIGH (ref 6–20)
CO2: 25 mmol/L (ref 22–32)
Calcium: 8.4 mg/dL — ABNORMAL LOW (ref 8.9–10.3)
Chloride: 107 mmol/L (ref 98–111)
Creatinine, Ser: 1.25 mg/dL — ABNORMAL HIGH (ref 0.61–1.24)
GFR, Estimated: >60 mL/min (ref >60)
Glucose, Bld: 253 mg/dL — ABNORMAL HIGH (ref 70–99)
Potassium: 4.9 mmol/L (ref 3.5–5.1)
Sodium: 139 mmol/L (ref 135–145)
Total Bilirubin: 0.7 mg/dL (ref 0.3–1.2)
Total Protein: 5 g/dL — ABNORMAL LOW (ref 6.5–8.1)

## 2020-09-06 LAB — CBC WITH DIFFERENTIAL/PLATELET
Abs Immature Granulocytes: 0.6 10*3/uL — ABNORMAL HIGH (ref 0.00–0.07)
Basophils Absolute: 0.1 10*3/uL (ref 0.0–0.1)
Basophils Relative: 0 %
Eosinophils Absolute: 0 10*3/uL (ref 0.0–0.5)
Eosinophils Relative: 0 %
HCT: 43.9 % (ref 39.0–52.0)
Hemoglobin: 13.8 g/dL (ref 13.0–17.0)
Immature Granulocytes: 4 %
Lymphocytes Relative: 7 %
Lymphs Abs: 1.1 10*3/uL (ref 0.7–4.0)
MCH: 30.7 pg (ref 26.0–34.0)
MCHC: 31.4 g/dL (ref 30.0–36.0)
MCV: 97.6 fL (ref 80.0–100.0)
Monocytes Absolute: 0.8 10*3/uL (ref 0.1–1.0)
Monocytes Relative: 6 %
Neutro Abs: 12 10*3/uL — ABNORMAL HIGH (ref 1.7–7.7)
Neutrophils Relative %: 83 %
Platelets: 173 10*3/uL (ref 150–400)
RBC: 4.5 MIL/uL (ref 4.22–5.81)
RDW: 13.1 % (ref 11.5–15.5)
WBC: 14.5 10*3/uL — ABNORMAL HIGH (ref 4.0–10.5)
nRBC: 0 % (ref 0.0–0.2)

## 2020-09-06 LAB — HEPATIC FUNCTION PANEL
ALT: 33 U/L (ref 0–44)
AST: 32 U/L (ref 15–41)
Albumin: 2 g/dL — ABNORMAL LOW (ref 3.5–5.0)
Alkaline Phosphatase: 87 U/L (ref 38–126)
Bilirubin, Direct: 0.2 mg/dL (ref 0.0–0.2)
Indirect Bilirubin: 0.7 mg/dL (ref 0.3–0.9)
Total Bilirubin: 0.9 mg/dL (ref 0.3–1.2)
Total Protein: 4.4 g/dL — ABNORMAL LOW (ref 6.5–8.1)

## 2020-09-06 LAB — BASIC METABOLIC PANEL
Anion gap: 8 (ref 5–15)
BUN: 32 mg/dL — ABNORMAL HIGH (ref 6–20)
CO2: 23 mmol/L (ref 22–32)
Calcium: 8.1 mg/dL — ABNORMAL LOW (ref 8.9–10.3)
Chloride: 113 mmol/L — ABNORMAL HIGH (ref 98–111)
Creatinine, Ser: 1.17 mg/dL (ref 0.61–1.24)
GFR, Estimated: >60 mL/min (ref >60)
Glucose, Bld: 161 mg/dL — ABNORMAL HIGH (ref 70–99)
Potassium: 4.4 mmol/L (ref 3.5–5.1)
Sodium: 144 mmol/L (ref 135–145)

## 2020-09-06 LAB — GLUCOSE, CAPILLARY
Glucose-Capillary: 231 mg/dL — ABNORMAL HIGH (ref 70–99)
Glucose-Capillary: 236 mg/dL — ABNORMAL HIGH (ref 70–99)
Glucose-Capillary: 205 mg/dL — ABNORMAL HIGH (ref 70–99)
Glucose-Capillary: 194 mg/dL — ABNORMAL HIGH (ref 70–99)
Glucose-Capillary: 178 mg/dL — ABNORMAL HIGH (ref 70–99)
Glucose-Capillary: 190 mg/dL — ABNORMAL HIGH (ref 70–99)
Glucose-Capillary: 181 mg/dL — ABNORMAL HIGH (ref 70–99)
Glucose-Capillary: 154 mg/dL — ABNORMAL HIGH (ref 70–99)
Glucose-Capillary: 141 mg/dL — ABNORMAL HIGH (ref 70–99)
Glucose-Capillary: 165 mg/dL — ABNORMAL HIGH (ref 70–99)

## 2020-09-06 LAB — LACTIC ACID, PLASMA
Lactic Acid, Venous: 1.5 mmol/L (ref 0.5–1.9)
Lactic Acid, Venous: 1.4 mmol/L (ref 0.5–1.9)
Lactic Acid, Venous: 1.6 mmol/L (ref 0.5–1.9)

## 2020-09-06 LAB — ECHOCARDIOGRAM LIMITED
Area-P 1/2: 3.03 cm2
Height: 69 in
S' Lateral: 2.7 cm
Weight: 3654.34 oz

## 2020-09-06 LAB — PROTIME-INR
INR: 1.1 (ref 0.8–1.2)
INR: 1.4 — ABNORMAL HIGH (ref 0.8–1.2)
Prothrombin Time: 14.1 seconds (ref 11.4–15.2)
Prothrombin Time: 16.7 s — ABNORMAL HIGH (ref 11.4–15.2)

## 2020-09-06 LAB — CBC
HCT: 36.4 % — ABNORMAL LOW (ref 39.0–52.0)
Hemoglobin: 12.3 g/dL — ABNORMAL LOW (ref 13.0–17.0)
MCH: 31.5 pg (ref 26.0–34.0)
MCHC: 33.8 g/dL (ref 30.0–36.0)
MCV: 93.1 fL (ref 80.0–100.0)
Platelets: 143 10*3/uL — ABNORMAL LOW (ref 150–400)
RBC: 3.91 MIL/uL — ABNORMAL LOW (ref 4.22–5.81)
RDW: 13.2 % (ref 11.5–15.5)
WBC: 6.8 10*3/uL (ref 4.0–10.5)
nRBC: 0 % (ref 0.0–0.2)

## 2020-09-06 LAB — D-DIMER, QUANTITATIVE: D-Dimer, Quant: >20 ug/mL-FEU — ABNORMAL HIGH (ref 0.00–0.50)

## 2020-09-06 LAB — PREPARE RBC (CROSSMATCH)

## 2020-09-06 LAB — LACTATE DEHYDROGENASE
LDH: 463 U/L — ABNORMAL HIGH (ref 98–192)
LDH: 411 U/L — ABNORMAL HIGH (ref 98–192)

## 2020-09-06 LAB — APTT
aPTT: 27 seconds (ref 24–36)
aPTT: 181 s (ref 24–36)
aPTT: 58 seconds — ABNORMAL HIGH (ref 24–36)

## 2020-09-06 LAB — HEMOGLOBIN A1C
Hgb A1c MFr Bld: 6 % — ABNORMAL HIGH (ref 4.8–5.6)
Mean Plasma Glucose: 125.5 mg/dL

## 2020-09-06 LAB — PHOSPHORUS: Phosphorus: 4.3 mg/dL (ref 2.5–4.6)

## 2020-09-06 LAB — FIBRINOGEN
Fibrinogen: 243 mg/dL (ref 210–475)
Fibrinogen: 202 mg/dL — ABNORMAL LOW (ref 210–475)

## 2020-09-06 LAB — HEPARIN LEVEL (UNFRACTIONATED): Heparin Unfractionated: 1.01 [IU]/mL — ABNORMAL HIGH (ref 0.30–0.70)

## 2020-09-06 LAB — POCT ACTIVATED CLOTTING TIME: Activated Clotting Time: 208 seconds

## 2020-09-06 LAB — PROCALCITONIN: Procalcitonin: 1.47 ng/mL

## 2020-09-06 LAB — MAGNESIUM: Magnesium: 2.9 mg/dL — ABNORMAL HIGH (ref 1.7–2.4)

## 2020-09-06 LAB — C-REACTIVE PROTEIN: CRP: 4 mg/dL — ABNORMAL HIGH (ref <1.0)

## 2020-09-06 LAB — ABO/RH: ABO/RH(D): O POS

## 2020-09-06 SURGERY — ECMO CANNULATION
Anesthesia: LOCAL

## 2020-09-06 MED ORDER — ALBUMIN HUMAN 5 % IV SOLN
12.5000 g | INTRAVENOUS | Status: DC | PRN
  Administered 2020-09-06 – 2020-09-29 (×17): 12.5 g via INTRAVENOUS
  Filled 2020-09-06 (×18): qty 250

## 2020-09-06 MED ORDER — LIDOCAINE HCL (PF) 1 % IJ SOLN
INTRAMUSCULAR | Status: DC | PRN
  Administered 2020-09-06: 20 mL
  Administered 2020-09-06: 15 mL

## 2020-09-06 MED ORDER — SODIUM CHLORIDE 0.9 % IV SOLN
0.0550 mg/kg/h | INTRAVENOUS | Status: DC
  Administered 2020-09-06: 0.05 mg/kg/h via INTRAVENOUS
  Filled 2020-09-06: qty 250

## 2020-09-06 MED ORDER — ASPIRIN 81 MG PO CHEW
81.0000 mg | CHEWABLE_TABLET | Freq: Every day | ORAL | Status: DC
  Administered 2020-09-06 – 2020-09-24 (×19): 81 mg
  Filled 2020-09-06 (×19): qty 1

## 2020-09-06 MED ORDER — DEXTROSE-NACL 5-0.45 % IV SOLN: INTRAVENOUS | Status: DC

## 2020-09-06 MED ORDER — ALBUMIN HUMAN 5 % IV SOLN
INTRAVENOUS | Status: AC
  Administered 2020-09-06: 12.5 g
  Filled 2020-09-06: qty 250

## 2020-09-06 MED ORDER — INSULIN REGULAR(HUMAN) IN NACL 100-0.9 UT/100ML-% IV SOLN
INTRAVENOUS | Status: DC
  Administered 2020-09-07: 6 [IU]/h via INTRAVENOUS
  Administered 2020-09-08: 5 [IU]/h via INTRAVENOUS
  Filled 2020-09-06 (×2): qty 100

## 2020-09-06 MED ORDER — INSULIN REGULAR(HUMAN) IN NACL 100-0.9 UT/100ML-% IV SOLN
INTRAVENOUS | Status: DC
  Administered 2020-09-06: 4 [IU]/h via INTRAVENOUS
  Filled 2020-09-06: qty 100

## 2020-09-06 MED ORDER — PRAVASTATIN SODIUM 40 MG PO TABS
40.0000 mg | ORAL_TABLET | Freq: Every day | ORAL | Status: DC
  Administered 2020-09-07 – 2020-10-11 (×35): 40 mg
  Filled 2020-09-06 (×36): qty 1

## 2020-09-06 MED ORDER — LIDOCAINE HCL (PF) 1 % IJ SOLN
INTRAMUSCULAR | Status: AC
  Filled 2020-09-06: qty 30

## 2020-09-06 MED ORDER — CALCIUM CHLORIDE 10 % IV SOLN
INTRAVENOUS | Status: AC
  Filled 2020-09-06: qty 10

## 2020-09-06 MED ORDER — POLYETHYLENE GLYCOL 3350 17 G PO PACK
17.0000 g | PACK | Freq: Every day | ORAL | Status: DC | PRN
  Administered 2020-09-24 – 2020-09-25 (×2): 17 g
  Filled 2020-09-06 (×2): qty 1

## 2020-09-06 MED ORDER — SODIUM CHLORIDE 0.9 % IV SOLN: INTRAVENOUS | Status: DC

## 2020-09-06 MED ORDER — HEPARIN SODIUM (PORCINE) 1000 UNIT/ML IJ SOLN
INTRAMUSCULAR | Status: AC
  Filled 2020-09-06: qty 2

## 2020-09-06 MED ORDER — DEXTROSE 50 % IV SOLN: 0.0000 mL | INTRAVENOUS | Status: DC | PRN

## 2020-09-06 MED ORDER — SODIUM CHLORIDE 0.9% IV SOLUTION: Freq: Once | INTRAVENOUS | Status: DC

## 2020-09-06 MED ORDER — ONDANSETRON HCL 4 MG/2ML IJ SOLN: 4.0000 mg | Freq: Four times a day (QID) | INTRAMUSCULAR | Status: DC | PRN

## 2020-09-06 MED ORDER — SODIUM BICARBONATE 8.4 % IV SOLN
INTRAVENOUS | Status: AC
  Filled 2020-09-06: qty 50

## 2020-09-06 MED ORDER — PANTOPRAZOLE SODIUM 40 MG IV SOLR: 40.0000 mg | Freq: Every day | INTRAVENOUS | Status: DC

## 2020-09-06 MED ORDER — PROSOURCE TF PO LIQD
90.0000 mL | Freq: Three times a day (TID) | ORAL | Status: DC
  Administered 2020-09-06 – 2020-09-07 (×2): 90 mL
  Filled 2020-09-06 (×2): qty 90

## 2020-09-06 MED ORDER — SODIUM CHLORIDE 0.9% FLUSH
10.0000 mL | Freq: Two times a day (BID) | INTRAVENOUS | Status: DC
  Administered 2020-09-06 – 2020-09-10 (×7): 10 mL
  Administered 2020-09-11: 23:00:00 30 mL
  Administered 2020-09-12 – 2020-09-16 (×6): 10 mL

## 2020-09-06 MED ORDER — LEVOTHYROXINE SODIUM 50 MCG PO TABS
50.0000 ug | ORAL_TABLET | Freq: Every day | ORAL | Status: DC
  Administered 2020-09-07 – 2020-10-12 (×34): 50 ug
  Filled 2020-09-06 (×35): qty 1

## 2020-09-06 MED ORDER — EPINEPHRINE 1 MG/10ML IJ SOSY
PREFILLED_SYRINGE | INTRAMUSCULAR | Status: AC
  Filled 2020-09-06: qty 20

## 2020-09-06 MED ORDER — SODIUM CHLORIDE 0.9% FLUSH: 10.0000 mL | INTRAVENOUS | Status: DC | PRN

## 2020-09-06 MED ORDER — HEPARIN (PORCINE) IN NACL 1000-0.9 UT/500ML-% IV SOLN
INTRAVENOUS | Status: DC | PRN
  Administered 2020-09-06: 500 mL

## 2020-09-06 MED ORDER — ALBUMIN HUMAN 5 % IV SOLN
INTRAVENOUS | Status: AC
  Administered 2020-09-07: 12.5 g
  Filled 2020-09-06: qty 250

## 2020-09-06 MED ORDER — ACETAMINOPHEN 325 MG PO TABS
650.0000 mg | ORAL_TABLET | Freq: Four times a day (QID) | ORAL | Status: DC | PRN
  Administered 2020-09-07 – 2020-09-08 (×2): 650 mg
  Filled 2020-09-06 (×2): qty 2

## 2020-09-06 MED ORDER — PIVOT 1.5 CAL PO LIQD
1000.0000 mL | ORAL | Status: DC
  Administered 2020-09-06: 1000 mL

## 2020-09-06 MED ORDER — PHENYLEPHRINE 40 MCG/ML (10ML) SYRINGE FOR IV PUSH (FOR BLOOD PRESSURE SUPPORT)
PREFILLED_SYRINGE | INTRAVENOUS | Status: AC
  Filled 2020-09-06: qty 10

## 2020-09-06 MED ORDER — ONDANSETRON HCL 4 MG PO TABS: 4.0000 mg | ORAL_TABLET | Freq: Four times a day (QID) | ORAL | Status: DC | PRN

## 2020-09-06 MED ORDER — AMLODIPINE BESYLATE 10 MG PO TABS: 10.0000 mg | ORAL_TABLET | Freq: Every day | ORAL | Status: DC

## 2020-09-06 MED ORDER — ALBUMIN HUMAN 5 % IV SOLN
INTRAVENOUS | Status: AC
  Filled 2020-09-06: qty 250

## 2020-09-06 MED ORDER — HEPARIN SODIUM (PORCINE) 1000 UNIT/ML IJ SOLN
INTRAMUSCULAR | Status: DC | PRN
  Administered 2020-09-06: 7500 [IU] via INTRAVENOUS

## 2020-09-06 SURGICAL SUPPLY — 5 items
CATH DUAL LUMEN CRESCENT 32FR (CATHETERS) ×2 IMPLANT
KIT VASCULAR ACCESS OPUS (MISCELLANEOUS) ×2 IMPLANT
PACK CARDIAC CATHETERIZATION (CUSTOM PROCEDURE TRAY) ×2 IMPLANT
SHEATH PINNACLE 5F 10CM (SHEATH) ×2 IMPLANT
SHEATH PROBE COVER 6X72 (BAG) ×2 IMPLANT

## 2020-09-06 NOTE — Progress Notes (Addendum)
ANTICOAGULATION CONSULT NOTE - Initial Consult  Pharmacy Consult for bivalirudin Indication: ECMO  No Known Allergies  Patient Measurements: Height: _0  (175.3 cm) Weight: 103.6 kg (228 lb 6.3 oz) IBW/kg (Calculated) : 70.7  Vital Signs: Temp: 96.1 F (35.6 C) (12/08 1100) Temp Source: Axillary (12/08 1100) BP: 101/55 (12/08 1239) Pulse Rate: 56 (12/08 1500)  Labs: Recent Labs    09/04/20 0255 09/04/20 0255 09/05/20 0258 09/05/20 0258 09/05/2020 0334 08/30/2020 0334 09/23/2020 0846 09/21/2020 0846 09/22/2020 0945 09/10/2020 1558 09/03/2020 1610  HGB 14.2   < > 13.5   < > 13.8   < > 12.9*   < >  --  12.6* 10.9*  HCT 42.3   < > 40.8   < > 43.9   < > 38.0*  --   --  37.0* 32.0*  PLT 150  --  152  --  173  --   --   --   --   --   --   APTT  --   --   --   --   --   --   --   --  27  --   --   LABPROT  --   --   --   --   --   --   --   --  14.1  --   --   INR  --   --   --   --   --   --   --   --  1.1  --   --   CREATININE 0.88  --  0.96  --  1.25*  --   --   --   --   --   --    < > = values in this interval not displayed.    Estimated Creatinine Clearance: 75.5 mL/min (A) (by C-G formula based on SCr of 1.25 mg/dL (H)).   Medical History: History reviewed. No pertinent past medical history.  Medications:  Scheduled:  . [MAR Hold] sodium chloride   Intravenous Once  . [MAR Hold] artificial tears  1 application Both Eyes T0V  . [MAR Hold] aspirin  81 mg Per Tube Daily  . [MAR Hold] baricitinib  4 mg Oral Daily  . calcium chloride      . [MAR Hold] chlorhexidine gluconate (MEDLINE KIT)  15 mL Mouth Rinse BID  . [MAR Hold] Chlorhexidine Gluconate Cloth  6 each Topical Daily  . [MAR Hold] clonazePAM  2 mg Per Tube BID  . [MAR Hold] docusate  100 mg Per Tube BID  . EPINEPHrine      . feeding supplement (PROSource TF)  90 mL Per Tube TID  . [MAR Hold] free water  200 mL Per Tube Q4H  . [MAR Hold] levothyroxine  50 mcg Per Tube Q0600  . [MAR Hold] mouth rinse  15 mL Mouth  Rinse 10 times per day  . [MAR Hold] methylPREDNISolone (SOLU-MEDROL) injection  80 mg Intravenous Q12H  . [MAR Hold] oxyCODONE  5 mg Per Tube Q6H  . [MAR Hold] pantoprazole (PROTONIX) IV  40 mg Intravenous Q24H  . phenylephrine      . [MAR Hold] polyethylene glycol  17 g Per Tube Daily  . [MAR Hold] pravastatin  40 mg Per Tube q1800  . sodium bicarbonate      . [MAR Hold] sodium chloride flush  10-40 mL Intracatheter Q12H    Assessment: 56 yom unvaccinated presenting with severe COVID ARDs, intubated on  12/6, continued to have ventilation issues despite NMB and proning - started on VV ECMO on 12/8.  Received 7500 units of heparin bolus in cath lab during cannulation. Hgb 12.6, plt 173. 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 No s/sx of bleeding. Baseline aPTT 27 earlier today.  Goal of Therapy:  aPTT 60-70 seconds Monitor platelets by anticoagulation protocol: Yes   Plan:  Start bivalirudin at 0.05 mg/kg/hr and get aPTT in 4 hours  Monitor daily aPTT, CBC, s/sx of bleeding, LDH, circuit  Antonietta Jewel, PharmD, [place] Pharmacist  Phone: [phone] 09/19/2020 4:29 PM  Please check AMION for all [place] phone numbers After 10:00 PM, call [place] [phone]  ADDENDUM  Initial heparin level and aPTT once back on 2H came back elevated (1.01 HL, 181 aPTT) likely secondary to heparin bolus in cath lab. APTT 5 hr after cannulation came back at 58 (therapeutic, slightly under goal range), on bivalirudin_0 .05 mg/kg/hr. Bleeding at IV site that was recently removed. Will increase to 0.055 mg/kg/hr and get level in 6 hours.   Antonietta Jewel, PharmD, [place] Clinical Pharmacist

## 2020-09-06 NOTE — Progress Notes (Signed)
RT attempted to obtain sputum culture. Pt without secretions at this time. No sample obtained. RT will continue to monitor.

## 2020-09-06 NOTE — Progress Notes (Signed)
VASCULAR LAB    Bilateral lower extremity venous duplex has been performed.  See CV proc for preliminary results.   Alenah Sarria, RVT 2020-09-17, 9:32 AM

## 2020-09-06 NOTE — Progress Notes (Signed)
Assisted tele visit to patient with family member.  Thomas, Erroll Wilbourne Renee, RN   

## 2020-09-06 NOTE — Progress Notes (Signed)
Cloth tape removed from pt. No breakdown noted to the lips, tongue or cheeks. ETT hollister placed on pt and ETT resecured at 25cm at the lip. RT will continue to monitor.

## 2020-09-06 NOTE — Progress Notes (Signed)
Initial Nutrition Assessment  DOCUMENTATION CODES:   Not applicable  INTERVENTION:   Noted no BM in 4 days, scheduled regimen in place  Tube feeding:  -Pivot 1.5 @ 40 ml/hr via Cortrak (960 ml) -ProSource TF 90 ml TID -Free water flushes 200 ml Q4 hours   Provides: 1680 kcal (2488 kcal with propofol), 156 grams protein, 720 ml free water (1920 ml with flushes). Kcal exceed requirement to meet protein needs.   NUTRITION DIAGNOSIS:   Increased nutrient needs related to acute illness (COVID PNA) as evidenced by estimated needs  GOAL:   Patient will meet greater than or equal to 90% of their needs  MONITOR:   Vent status, Skin, Weight trends, Labs, I & O's, TF tolerance  REASON FOR ASSESSMENT:   Ventilator, Consult Enteral/tube feeding initiation and management  ASSESSMENT:   Patient with PMH significant for asthma, cancer, CHF, DM, HTN, sickle cell anemia, renal insufficiency, and hyperlipidemia. Presents this admission with severe COVID ARDS.   12/4- admit [facility] 12/6- hypoxia, intubated 12/7- transfer to [facility] for ECMO  Off pressors. Requiring propofol. Has new bilateral lower extremity DVT. Having issues with ventilation despite NMB/proning. Meets criteria for VV ECMO. Undergoing cannulation at this time.   Noted hyperglycemia. Started on insulin drip. Post pyloric Cortrak placed this am. Transition tube feeding to better meet needs.   Admission weight: 101.9 kg  Current weight: 103.6 kg   Patient is currently intubated on ventilator support MV: 13.8 L/min Temp (24hrs), Avg:97.8 F (36.6 C), Min:96.1 F (35.6 C), Max:98.4 F (36.9 C)  Propofol: 30.6 ml/hr- provides 808 kcal from lipids daily   UOP: 2175 ml x 24 hrs   Drips: nimbex, insulin, propofol Medications: colace, solumedrol, miralax,  Labs: Mg 2.9 (H) CBG 140-253 Cr 1.25- trending up  Diet Order:   Diet Order            Diet NPO time specified  Diet effective now                 EDUCATION  NEEDS:   Not appropriate for education at this time  Skin:  Skin Assessment: Reviewed RN Assessment  Last BM:  12/4  Height:   Ht Readings from Last 1 Encounters:  09/05/20 5\' 9"  (1.753 m)    Weight:   Wt Readings from Last 1 Encounters:  08/31/2020 103.6 kg    Adjusted Body Weight:  84.5 kg   BMI:  Body mass index is 33.73 kg/m.  Estimated Nutritional Needs:   Kcal:  1600-1900 kcal  Protein:  145-180 grams  Fluid:  >/= 1.9 L/day  14/08/21 RD, LDN Clinical Nutrition Pager listed in AMION

## 2020-09-06 NOTE — Progress Notes (Signed)
ECMO SPECIALIST NOTE  Patient began to cough forcefully causing the ECMO flows to drop.  Patient O2 saturation dropped to the low 50's.  Sedation was given and was able to slowly increase flows back to 3500 RPM's with a return of O2 SATS in mid 90's.

## 2020-09-06 NOTE — Progress Notes (Signed)
ABG results given to Dr. Chand. No new orders received at this time. RT will continue to monitor.  

## 2020-09-06 NOTE — Consult Note (Signed)
Advanced Heart Failure Team Consult Note   Primary Physician: Jeni Salles, MD PCP-Cardiologist:  No primary care provider on file.  Reason for Consultation: Acute hypoxic espiratory failure in setting of COVID-19 PNA - possible need for VV ECMO  Requesting: Dr. [name]  HPI:    Donald Rowe is seen today for evaluation of acute hypoxic espiratory failure in setting of COVID-19 PNA - possible need for VV ECMO at the request of Dr. [name].  Donald Rowe is a 59 y.o. male with a pertinent history of hypothyroidism on levothyroxine and hyperlipidemia who was diagnosed with Covid 1 November. He was not vaccinated.  Admitted 12/4 and has had progressive respiratory compromise with ARDS. Intubated 12/6.  Having issues with ventilation despite NMB, proning.  Echo LVEF 60% RV normal   CT angio: severe COVID lung disease. No obvious PE   LE u/s with bilateral DVTs below the knees.   Review of Systems: unavailable due to intubation   Home Medications Prior to Admission medications   Medication Sig Start Date End Date Taking? Authorizing Provider  aspirin 81 MG chewable tablet Chew 81 mg by mouth daily.   Yes [provider]  cholecalciferol (VITAMIN D) 25 MCG (1000 UNIT) tablet Take 1,000 Units by mouth daily.   Yes [provider]  HYDROcodone-homatropine (HYCODAN) 5-1.5 MG/5ML syrup Take 5 mLs by mouth every 4 (four) hours as needed for cough.  03/20/20  Yes [provider]  ibuprofen (ADVIL) 200 MG tablet Take 600-800 mg by mouth every 6 (six) hours as needed for fever or headache (pain).   Yes [provider]  levothyroxine (SYNTHROID) 50 MCG tablet Take 50 mcg by mouth daily. 06/30/20  Yes [provider]  meloxicam (MOBIC) 15 MG tablet Take 15 mg by mouth daily. 06/27/20  Yes [provider]  Phenyleph-Diphenhyd-DM-APAP ([place]) Packet MISC Take 1 packet by mouth once.   Yes [provider]  Pitavastatin Calcium (LIVALO) 4 MG TABS Take 2 mg by mouth daily.   Yes [provider]    Past Medical History: 1. Hypothyroid 2. Hyperlipidemia   Family History: No FHx of SCD or premature CAD  Social History: Social History   Socioeconomic History  . Marital status: Single    Spouse name: Not on file  . Number of children: Not on file  . Years of education: Not on file  . Highest education level: Not on file  Occupational History  . Not on file  Tobacco Use  . Smoking status: Never Smoker  . Smokeless tobacco: Never Used  Substance and Sexual Activity  . Alcohol use: Not on file  . Drug use: Not on file  . Sexual activity: Not on file  Other Topics Concern  . Not on file  Social History Narrative  . Not on file   Social Determinants of Health   Financial Resource Strain:   . Difficulty of Paying Living Expenses: Not on file  Food Insecurity:   . Worried About Charity fundraiser in the Last Year: Not on file  . Ran Out of Food in the Last Year: Not on file  Transportation Needs:   . Lack of Transportation (Medical): Not on file  . Lack of Transportation (Non-Medical): Not on file  Physical Activity:   . Days of Exercise per Week: Not on file  . Minutes of Exercise per Session: Not on file  Stress:   . Feeling of Stress : Not  on file  Social Connections:   . Frequency of Communication with Friends and Family: Not on file  . Frequency of Social Gatherings with Friends and Family: Not on file  . Attends Religious Services: Not on file  . Active Member of Clubs or Organizations: Not on file  . Attends Archivist Meetings: Not on file  . Marital Status: Not on file    Allergies:  No Known Allergies  Objective:    Vital Signs:   Temp:  [96.1 F (35.6 C)-98.4 F (36.9 C)] 96.1 F (35.6 C) (12/08 1100) Pulse Rate:  [56-100] 56 (12/08 1500) Resp:  [28-35] 35 (12/08 1500) BP: (101-127)/(55-56) 101/55 (12/08 1239) SpO2:   [90 %-98 %] 90 % (12/08 1539) Arterial Line BP: (103-150)/(45-63) 120/55 (12/08 1500) FiO2 (%):  [60 %-70 %] 60 % (12/08 1239) Weight:  [103.6 kg] 103.6 kg (12/08 0342) Last BM Date: 09/11/2020  Weight change: Filed Weights   09/03/20 1700 09/22/2020 0342  Weight: 101.9 kg 103.6 kg    Intake/Output:   Intake/Output Summary (Last 24 hours) at 09/23/2020 1623 Last data filed at 09/05/2020 1300 Gross per 24 hour  Intake 2365.13 ml  Output 3030 ml  Net -664.87 ml      Physical Exam    General:  ill appearing male sedated on vent HEENT: normal Neck: supple. JVP elevated. Carotids 2+ bilat; no bruits. No lymphadenopathy or thyromegaly appreciated. Cor: PMI nondisplaced. Regular rate & rhythm. No rubs, gallops or murmurs. Lungs: diffuse rhonchi Abdomen: soft, nontender, nondistended. No hepatosplenomegaly. No bruits or masses. Good bowel sounds. Extremities: no cyanosis, clubbing, rash, edema Neuro: sedated paralyzed   Telemetry   Sinus 70-80s Personally reviewed   Labs   Basic Metabolic Panel: Recent Labs  Lab 09/16/2020 1242 08/30/2020 1242 09/03/20 0143 09/03/20 0143 09/04/20 0255 09/04/20 0255 09/05/20 0258 09/05/2020 0334 09/25/2020 0846 09/20/2020 1558 09/23/2020 1610  NA 133*   < > 134*   < > 136   < > 137 139 142 144 144  K 4.4   < > 3.9   < > 4.1   < > 4.4 4.9 4.7 4.5 4.5  CL 99  --  100  --  104  --  104 107  --   --   --   CO2 24  --  23  --  22  --  24 25  --   --   --   GLUCOSE 107*  --  118*  --  140*  --  171* 253*  --   --   --   BUN 14  --  15  --  23*  --  31* 41*  --   --   --   CREATININE 1.05  --  1.08  --  0.88  --  0.96 1.25*  --   --   --   CALCIUM 8.6*   < > 8.4*   < > 8.2*  --  8.3* 8.4*  --   --   --   MG  --   --  1.8  --   --   --   --  2.9*  --   --   --   PHOS  --   --  1.4*  --   --   --   --  4.3  --   --   --    < > = values in this interval not displayed.    Liver Function Tests: Recent Labs  Lab 09/08/2020 1242 09/03/20 0143  09/04/20 0255 09/05/20 0258 09/20/2020 0334  AST 68* 96* 98* 71* 38  ALT 44 53* 57* 51* 41  ALKPHOS 118 140* 114 116 107  BILITOT 0.9 0.8 0.9 0.8 0.7  PROT 6.5 6.3* 5.4* 5.2* 5.0*  ALBUMIN 3.3* 3.2* 2.7* 2.5* 2.3*   No results for input(s): LIPASE, AMYLASE in the last 168 hours. No results for input(s): AMMONIA in the last 168 hours.  CBC: Recent Labs  Lab 09/16/2020 1242 09/27/2020 1242 09/03/20 0143 09/03/20 0143 09/04/20 0255 09/04/20 0255 09/05/20 0258 09/26/2020 0334 09/12/2020 0846 09/09/2020 1558 09/19/2020 1610  WBC 8.1  --  11.6*  --  17.6*  --  17.4* 14.5*  --   --   --   NEUTROABS 6.1  --  9.7*  --  14.6*  --  15.0* 12.0*  --   --   --   HGB 15.2   < > 14.8   < > 14.2   < > 13.5 13.8 12.9* 12.6* 10.9*  HCT 44.4   < > 43.9   < > 42.3   < > 40.8 43.9 38.0* 37.0* 32.0*  MCV 91.5  --  91.8  --  91.8  --  92.5 97.6  --   --   --   PLT 139*  --  150  --  150  --  152 173  --   --   --    < > = values in this interval not displayed.    Cardiac Enzymes: No results for input(s): CKTOTAL, CKMB, CKMBINDEX, TROPONINI in the last 168 hours.  BNP: BNP (last 3 results) No results for input(s): BNP in the last 8760 hours.  ProBNP (last 3 results) No results for input(s): PROBNP in the last 8760 hours.   CBG: Recent Labs  Lab 09/26/2020 0728 09/10/2020 1004 09/25/2020 1133 08/30/2020 1302 09/02/2020 1405  GLUCAP 205* 194* 178* 190* 181*    Coagulation Studies: Recent Labs    09/29/2020 0945  LABPROT 14.1  INR 1.1     Imaging   DG Abd Portable 1V  Result Date: 08/30/2020 CLINICAL DATA:  Feeding tube placement EXAM: PORTABLE ABDOMEN - 1 VIEW COMPARISON:  None. FINDINGS: Enteric tube passes into the horizontal segment of the duodenum. Bowel gas pattern is unremarkable. IMPRESSION: Enteric tube within the duodenum. Electronically Signed   By: Macy Mis M.D.   On: 09/23/2020 13:56   VAS [place] LOWER EXTREMITY VENOUS (DVT)  Result Date: 09/27/2020  Lower Venous DVT Study  Indications: Covid-19, pre operative ECMO.  Limitations: Ventilation and bandages. Comparison Study: Prior negative bilateral lower extremity venous duplex done                   09/15/2020 is available for comparison. Performing Technologist: Sharion Dove RVS  Examination Guidelines: A complete evaluation includes B-mode imaging, spectral Doppler, color Doppler, and power Doppler as needed of all accessible portions of each vessel. Bilateral testing is considered an integral part of a complete examination. Limited examinations for reoccurring indications may be performed as noted. The reflux portion of the exam is performed with the patient in reverse Trendelenburg.  +---------+---------------+---------+-----------+----------+------------------+ RIGHT    CompressibilityPhasicitySpontaneityPropertiesThrombus Aging     +---------+---------------+---------+-----------+----------+------------------+ CFV      Full           Yes      Yes                                     +---------+---------------+---------+-----------+----------+------------------+  SFJ      Full                                                            +---------+---------------+---------+-----------+----------+------------------+ FV Prox  Full                                                            +---------+---------------+---------+-----------+----------+------------------+ FV Mid   Full                                                            +---------+---------------+---------+-----------+----------+------------------+ FV DistalFull                                                            +---------+---------------+---------+-----------+----------+------------------+ PFV      Full                                                            +---------+---------------+---------+-----------+----------+------------------+ POP      Full           Yes      Yes                                      +---------+---------------+---------+-----------+----------+------------------+ PTV      None                                         acute, proximal                                                          calf               +---------+---------------+---------+-----------+----------+------------------+ PERO     None                                         acute, proximal  calf               +---------+---------------+---------+-----------+----------+------------------+ Gastroc  None                                         Acute              +---------+---------------+---------+-----------+----------+------------------+   +---------+---------------+---------+-----------+----------+-------------------+ LEFT     CompressibilityPhasicitySpontaneityPropertiesThrombus Aging      +---------+---------------+---------+-----------+----------+-------------------+ CFV      Full           Yes      Yes                                      +---------+---------------+---------+-----------+----------+-------------------+ SFJ      Full                                                             +---------+---------------+---------+-----------+----------+-------------------+ FV Prox  Full                                                             +---------+---------------+---------+-----------+----------+-------------------+ FV Mid                                                Not visualized                                                            secondary to                                                              bandages            +---------+---------------+---------+-----------+----------+-------------------+ FV DistalFull                                                             +---------+---------------+---------+-----------+----------+-------------------+ PFV       Full                                                             +---------+---------------+---------+-----------+----------+-------------------+  POP      Full           Yes      Yes                                      +---------+---------------+---------+-----------+----------+-------------------+ PTV      Full                                                             +---------+---------------+---------+-----------+----------+-------------------+ PERO     None                                         acute, proximal                                                           calf                +---------+---------------+---------+-----------+----------+-------------------+ Gastroc  None                                         Acute               +---------+---------------+---------+-----------+----------+-------------------+    Summary: RIGHT: - Findings consistent with acute deep vein thrombosis involving the right posterior tibial veins, right peroneal veins, and right gastrocnemius veins. - Findings suggest new clot progression as compared to previous examination.  LEFT: - Findings consistent with acute deep vein thrombosis involving the left peroneal veins, and left gastrocnemius veins. - Findings suggest new clot progression as compared to previous examination.  *See table(s) above for measurements and observations. Electronically signed by Servando Snare MD on 09/15/2020 at 3:12:22 PM.    Final    ECHOCARDIOGRAM LIMITED  Result Date: 09/14/2020    ECHOCARDIOGRAM LIMITED REPORT   Patient Name:   Donald Rowe Date of Exam: 09/14/2020 Medical Rec #:  [id number]      Height:       69.0 in Accession #:    [id number]     Weight:       228.4 lb Date of Birth:  Sep 05, 1961       BSA:          2.186 m Patient Age:    26 years       BP:           122/55 mmHg Patient Gender: M              HR:           60 bpm. Exam Location:  Inpatient Procedure: Cardiac Doppler, Color  Doppler and Limited Echo STAT ECHO Indications:    Cardiomyopathy  History:        Patient has no prior history of Echocardiogram examinations.                 Signs/Symptoms:Shortness of  Breath and Fever; Risk                 Factors:Dyslipidemia. Covid+.  Sonographer:    Dustin Flock Referring Phys: [id number] [place]  1. Left ventricular ejection fraction, by estimation, is 60 to 65%. The left ventricle has normal function. The left ventricle has no regional wall motion abnormalities. There is mild left ventricular hypertrophy. Left ventricular diastolic parameters are indeterminate.  2. Right ventricular systolic function is normal. The right ventricular size is normal. There is moderately elevated pulmonary artery systolic pressure.  3. The mitral valve is normal in structure. No evidence of mitral valve regurgitation.  4. The aortic valve was not well visualized. Aortic valve regurgitation is not visualized. No aortic stenosis is present. FINDINGS  Left Ventricle: Left ventricular ejection fraction, by estimation, is 60 to 65%. The left ventricle has normal function. The left ventricle has no regional wall motion abnormalities. The left ventricular internal cavity size was normal in size. There is  mild left ventricular hypertrophy. Left ventricular diastolic parameters are indeterminate. Right Ventricle: The right ventricular size is normal. Right ventricular systolic function is normal. There is moderately elevated pulmonary artery systolic pressure. The tricuspid regurgitant velocity is 3.34 m/s, and with an assumed right atrial pressure of 10 mmHg, the estimated right ventricular systolic pressure is 19.4 mmHg. Pericardium: There is no evidence of pericardial effusion. Mitral Valve: The mitral valve is normal in structure. Tricuspid Valve: The tricuspid valve is normal in structure. Tricuspid valve regurgitation is mild. Aortic Valve: The aortic valve was not well visualized. Aortic  valve regurgitation is not visualized. No aortic stenosis is present. Aorta: The aortic root is normal in size and structure. Venous: IVC assessment for right atrial pressure unable to be performed due to mechanical ventilation. IAS/Shunts: No atrial level shunt detected by color flow Doppler. LEFT VENTRICLE PLAX 2D LVIDd:         4.60 cm  Diastology LVIDs:         2.70 cm  LV e' medial:    8.38 cm/s LV PW:         1.20 cm  LV E/e' medial:  7.9 LV IVS:        1.20 cm  LV e' lateral:   7.72 cm/s LVOT diam:     1.90 cm  LV E/e' lateral: 8.6 LVOT Area:     2.84 cm  RIGHT VENTRICLE RV S prime:     3.05 cm/s LEFT ATRIUM         Index LA diam:    3.30 cm 1.51 cm/m   AORTA Ao Root diam: 2.60 cm MITRAL VALVE               TRICUSPID VALVE MV Area (PHT): 3.03 cm    TR Peak grad:   44.6 mmHg MV Decel Time: 250 msec    TR Vmax:        334.00 cm/s MV E velocity: 66.40 cm/s MV A velocity: 59.10 cm/s  SHUNTS MV E/A ratio:  1.12        Systemic Diam: 1.90 cm Oswaldo Milian MD Electronically signed by Oswaldo Milian MD Signature Date/Time: 09/11/2020/10:36:22 AM    Final       Medications:     Current Medications: . [MAR Hold] sodium chloride   Intravenous Once  . [MAR Hold] artificial tears  1 application Both Eyes [id number]  . [MAR Hold] aspirin  81 mg Per Tube Daily  . [facility] Hold]  baricitinib  4 mg Oral Daily  . calcium chloride      . [MAR Hold] chlorhexidine gluconate (MEDLINE KIT)  15 mL Mouth Rinse BID  . [MAR Hold] Chlorhexidine Gluconate Cloth  6 each Topical Daily  . [MAR Hold] clonazePAM  2 mg Per Tube BID  . [MAR Hold] docusate  100 mg Per Tube BID  . EPINEPHrine      . feeding supplement (PROSource TF)  90 mL Per Tube TID  . [MAR Hold] free water  200 mL Per Tube Q4H  . [MAR Hold] levothyroxine  50 mcg Per Tube Q0600  . [MAR Hold] mouth rinse  15 mL Mouth Rinse 10 times per day  . [MAR Hold] methylPREDNISolone (SOLU-MEDROL) injection  80 mg Intravenous Q12H  . [MAR Hold] oxyCODONE  5 mg  Per Tube Q6H  . [MAR Hold] pantoprazole (PROTONIX) IV  40 mg Intravenous Q24H  . phenylephrine      . [MAR Hold] polyethylene glycol  17 g Per Tube Daily  . [MAR Hold] pravastatin  40 mg Per Tube q1800  . sodium bicarbonate      . [MAR Hold] sodium chloride flush  10-40 mL Intracatheter Q12H     Infusions: . albumin human    . bivalirudin (ANGIOMAX) infusion 0.5 mg/mL (Non-ACS indications) 0.05 mg/kg/hr (09/03/2020 1603)  . [MAR Hold] ceFEPime (MAXIPIME) IV Stopped (08/31/2020 0901)  . [MAR Hold] cisatracurium (NIMBEX) infusion 2 mg/mL 3 mcg/kg/min (09/19/2020 1300)  . feeding supplement (PIVOT 1.5 CAL)    . fentaNYL infusion INTRAVENOUS 250 mcg/hr (09/26/2020 1449)  . insulin 3.2 mL/hr at 09/07/2020 1300  . [MAR Hold] norepinephrine (LEVOPHED) Adult infusion Stopped (08/30/2020 0824)  . propofol (DIPRIVAN) infusion 50 mcg/kg/min (09/22/2020 1300)       Assessment/Plan   1. Acute hypoxic respiratory failure/ARDS in setting of COVID-19 PNA - intubated x 2 days - failing proning. RESP score 3 - will plan VV ECMO cannulation - has complete remedisivir, baricitinib - continue steroids and abx (cefepime) - start bival after cannulation  2. Bilateral DVT - continue bival  3. DM2 - on insulin gtt  4. Obesity -Body mass index is 33.73 kg/m.  CRITICAL CARE Performed by: Glori Bickers  Total critical care time: 45 minutes  Critical care time was exclusive of separately billable procedures and treating other patients.  Critical care was necessary to treat or prevent imminent or life-threatening deterioration.  Critical care was time spent personally by me (independent of midlevel providers or residents) on the following activities: development of treatment plan with patient and/or surrogate as well as nursing, discussions with consultants, evaluation of patient's response to treatment, examination of patient, obtaining history from patient or surrogate, ordering and performing  treatments and interventions, ordering and review of laboratory studies, ordering and review of radiographic studies, pulse oximetry and re-evaluation of patient's condition.   Length of Stay: [place], MD  09/09/2020, 4:23 PM  Advanced Heart Failure Team Pager [phone] (M-F; 7a - 4p)  Please contact [facility] for night-coverage after hours (4p -7a ) and weekends on [email or web address]

## 2020-09-06 NOTE — Progress Notes (Signed)
Pt transported on ventilator from 3M10 to cath lab for ECMO cannulation. VSS throughout. RT, EMT and RN accompanied pt to cath lab suite. RT remained with pt for procedure.

## 2020-09-06 NOTE — Procedures (Signed)
Cortrak  Person Inserting Tube:  Renie Ora, RD Tube Type:  Cortrak - 43 inches Tube Location:  Left nare Initial Placement:  Postpyloric Secured by: Bridle Technique Used to Measure Tube Placement:  Documented cm marking at nare/ corner of mouth Cortrak Secured At:  99 cm Procedure Comments:  Cortrak Tube Team Note:  Consult received to place a Cortrak feeding tube.   X-ray is required, abdominal x-ray has been ordered by the Cortrak team. Please confirm tube placement before using the Cortrak tube.   If the tube becomes dislodged please keep the tube and contact the Cortrak team at [email or web address] ([email or web address]) for replacement.  If after hours and replacement cannot be delayed, place a NG tube and confirm placement with an abdominal x-ray.       Trenton Gammon, MS, RD, LDN, CNSC Inpatient Clinical Dietitian RD pager # available in AMION  After hours/weekend pager # available in [facility]

## 2020-09-06 NOTE — Consult Note (Signed)
09-10-20  ECMO Consult Note   Called to 67M for ECMO Consult at (time)0700 by Dr. Chestine Spore MD Admitting Diagnosis- COVID Pneumonia Primary Issue- Inability to ventilate Age:59 y.o. Weight: 103kg  Days on Mechanical Ventilation- 2  Vasopressors yes   MSOF No   RESP score (VV-ECMO) : 3 [email or web address]   Recent Blood Gas:     Component Value Date/Time   PHART 7.167 (LL) 09/05/2020 1530   PCO2ART 70.8 (HH) 09/05/2020 1530   PO2ART 162 (H) 09/05/2020 1530   HCO3 24.6 09/05/2020 1530   ACIDBASEDEF 5.4 (H) 09/05/2020 1530   O2SAT 98.6 09/05/2020 1530    Coags:    Component Value Date/Time   FIBRINOGEN 556 (H) 09/01/2020 1242    CBC    Component Value Date/Time   WBC 14.5 (H) 2020-09-10 0334   RBC 4.50 09-10-20 0334   HGB 13.8 09/10/2020 0334   HCT 43.9 2020-09-10 0334   PLT 173 09-10-2020 0334   MCV 97.6 10-Sep-2020 0334   MCH 30.7 09/10/20 0334   MCHC 31.4 09/10/2020 0334   RDW 13.1 09-10-20 0334   LYMPHSABS 1.1 Sep 10, 2020 0334   MONOABS 0.8 09-10-2020 0334   EOSABS 0.0 September 10, 2020 0334   BASOSABS 0.1 09/10/20 0334    BMET    Component Value Date/Time   NA 139 09/10/20 0334   K 4.9 Sep 10, 2020 0334   CL 107 2020-09-10 0334   CO2 25 09-10-2020 0334   GLUCOSE 253 (H) 09/10/20 0334   BUN 41 (H) 09-10-20 0334   CREATININE 1.25 (H) 2020/09/10 0334   CALCIUM 8.4 (L) 10-Sep-2020 0334   GFRNONAA >60 2020-09-10 0334                                                                                                                                                             ECMO physician Dr. Katrinka Blazing notified at 0700 (time) Candidate meets ECMO Criteria- Yes   Additional notes- will discuss with CHF and TCTS as well as family. Reason for Consult:ARDS Referring Physician: [name] is an 59 y.o. male.  HPI:  59 year old unvaccinated man presenting with severe COVID ARDS.  Unfortunately ended up intubated on 09/04/20.  Having issues  with ventilation despite NMB, proning.  PMH HLD, hypothyroidism  No surgical history  History reviewed. No pertinent family history.  Social History:  reports that he has never smoked. He has never used smokeless tobacco. No history on file for alcohol use and drug use.  Allergies: No Known Allergies  Medications: I have reviewed the patient's current medications.  Results for orders placed or performed during the hospital encounter of 09/05/2020 (from the past 48 hour(s))  Blood gas, arterial     Status: Abnormal   Collection Time: 09/04/20  8:45 PM  Result Value Ref  Range   FIO2 100.00    pH, Arterial 7.308 (L) 7.35 - 7.45   pCO2 arterial 52.0 (H) 32 - 48 mmHg   pO2, Arterial 92.2 83 - 108 mmHg   Bicarbonate 25.1 20.0 - 28.0 mmol/L   Acid-base deficit 1.3 0.0 - 2.0 mmol/L   O2 Saturation 95.8 %   Patient temperature 99.6    Allens test (pass/fail) PASS PASS    Comment: Performed at [facility], [address] [place], [place] [address]  Glucose, capillary     Status: Abnormal   Collection Time: 09/04/20  9:07 PM  Result Value Ref Range   Glucose-Capillary 153 (H) 70 - 99 mg/dL    Comment: Glucose reference range applies only to samples taken after fasting for at least 8 hours.  Triglycerides     Status: None   Collection Time: 09/04/20 10:20 PM  Result Value Ref Range   Triglycerides 126 <150 mg/dL    Comment: Performed at [facility], [address] [place], [place] [address]  CBC with Differential/Platelet     Status: Abnormal   Collection Time: 09/05/20  2:58 AM  Result Value Ref Range   WBC 17.4 (H) 4.0 - 10.5 K/uL   RBC 4.41 4.22 - 5.81 MIL/uL   Hemoglobin 13.5 13.0 - 17.0 g/dL   HCT 76.2 39 - 52 %   MCV 92.5 80.0 - 100.0 fL   MCH 30.6 26.0 - 34.0 pg   MCHC 33.1 30.0 - 36.0 g/dL   RDW 26.3 33.5 - 45.6 %   Platelets 152 150 - 400 K/uL   nRBC 0.0 0.0 - 0.2 %   Neutrophils Relative % 87 %   Neutro Abs 15.0 (H) 1.7 - 7.7  K/uL   Lymphocytes Relative 8 %   Lymphs Abs 1.5 0.7 - 4.0 K/uL   Monocytes Relative 4 %   Monocytes Absolute 0.7 0.1 - 1.0 K/uL   Eosinophils Relative 0 %   Eosinophils Absolute 0.0 0.0 - 0.5 K/uL   Basophils Relative 0 %   Basophils Absolute 0.0 0.0 - 0.1 K/uL   Immature Granulocytes 1 %   Abs Immature Granulocytes 0.25 (H) 0.00 - 0.07 K/uL    Comment: Performed at [facility], [address], [place], [place] [address]  Comprehensive metabolic panel     Status: Abnormal   Collection Time: 09/05/20  2:58 AM  Result Value Ref Range   Sodium 137 135 - 145 mmol/L   Potassium 4.4 3.5 - 5.1 mmol/L   Chloride 104 98 - 111 mmol/L   CO2 24 22 - 32 mmol/L   Glucose, Bld 171 (H) 70 - 99 mg/dL    Comment: Glucose reference range applies only to samples taken after fasting for at least 8 hours.   BUN 31 (H) 6 - 20 mg/dL   Creatinine, Ser 9.37 0.61 - 1.24 mg/dL   Calcium 8.3 (L) 8.9 - 10.3 mg/dL   Total Protein 5.2 (L) 6.5 - 8.1 g/dL   Albumin 2.5 (L) 3.5 - 5.0 g/dL   AST 71 (H) 15 - 41 U/L   ALT 51 (H) 0 - 44 U/L   Alkaline Phosphatase 116 38 - 126 U/L   Total Bilirubin 0.8 0.3 - 1.2 mg/dL   GFR, Estimated >34 >28 mL/min    Comment: (NOTE) Calculated using the CKD-EPI Creatinine Equation (2021)    Anion gap 9 5 - 15    Comment: Performed at [facility],  [address], [place], [place] [address]  C-reactive protein     Status: Abnormal   Collection Time: 09/05/20  2:58 AM  Result Value Ref Range   CRP 5.8 (H) <1.0 mg/dL    Comment: Performed at [facility], [address] [place], [place] [address]  D-dimer, quantitative (not at [facility])     Status: Abnormal   Collection Time: 09/05/20  2:58 AM  Result Value Ref Range   D-Dimer, Quant >20.00 (H) 0.00 - 0.50 ug/mL-FEU    Comment: (NOTE) At the manufacturer cut-off value of 0.5 g/mL FEU, this assay has a negative predictive value of 95-100%.This assay is intended for  use in conjunction with a clinical pretest probability (PTP) assessment model to exclude pulmonary embolism (PE) and deep venous thrombosis (DVT) in outpatients suspected of PE or DVT. Results should be correlated with clinical presentation. Performed at [facility], [address], [place], [place] [address]   Procalcitonin - Baseline     Status: None   Collection Time: 09/05/20  2:58 AM  Result Value Ref Range   Procalcitonin 3.42 ng/mL    Comment:        Interpretation: PCT > 2 ng/mL: Systemic infection (sepsis) is likely, unless other causes are known. (NOTE)       Sepsis PCT Algorithm           Lower Respiratory Tract                                      Infection PCT Algorithm    ----------------------------     ----------------------------         PCT < 0.25 ng/mL                PCT < 0.10 ng/mL          Strongly encourage             Strongly discourage   discontinuation of antibiotics    initiation of antibiotics    ----------------------------     -----------------------------       PCT 0.25 - 0.50 ng/mL            PCT 0.10 - 0.25 ng/mL               OR       >80% decrease in PCT            Discourage initiation of                                            antibiotics      Encourage discontinuation           of antibiotics    ----------------------------     -----------------------------         PCT >= 0.50 ng/mL              PCT 0.26 - 0.50 ng/mL               AND       <80% decrease in PCT              Encourage initiation of  antibiotics       Encourage continuation           of antibiotics    ----------------------------     -----------------------------        PCT >= 0.50 ng/mL                  PCT > 0.50 ng/mL               AND         increase in PCT                  Strongly encourage                                      initiation of antibiotics    Strongly encourage escalation           of  antibiotics                                     -----------------------------                                           PCT <= 0.25 ng/mL                                                 OR                                        > 80% decrease in PCT                                      Discontinue / Do not initiate                                             antibiotics  Performed at [facility], [address], [place], [place] [address]   Blood gas, arterial     Status: Abnormal   Collection Time: 09/05/20  8:10 AM  Result Value Ref Range   FIO2 100.00    Delivery systems VENTILATOR    Mode PRESSURE REGULATED VOLUME CONTROL    VT 420 mL   LHR 35.00 resp/min   Peep/cpap 12.0 cm H20   pH, Arterial 7.344 (L) 7.35 - 7.45   pCO2 arterial 49.9 (H) 32 - 48 mmHg   pO2, Arterial 56.0 (L) 83 - 108 mmHg   Bicarbonate 26.4 20.0 - 28.0 mmol/L   Acid-Base Excess 0.6 0.0 - 2.0 mmol/L   O2 Saturation 86.2 %   Patient temperature 98.6    Collection site RIGHT RADIAL    Drawn by [id number]     Comment: Performed at [facility], [address] [place], [place] [address]  Glucose, capillary     Status: Abnormal  Collection Time: 09/05/20 12:45 PM  Result Value Ref Range   Glucose-Capillary 177 (H) 70 - 99 mg/dL    Comment: Glucose reference range applies only to samples taken after fasting for at least 8 hours.  Blood gas, arterial     Status: Abnormal   Collection Time: 09/05/20  3:30 PM  Result Value Ref Range   pH, Arterial 7.167 (LL) 7.35 - 7.45    Comment: CRITICAL RESULT CALLED TO, READ BACK BY AND VERIFIED WITH: A.DUNLAP AT 1645 ON 09/05/20 BY N.THOMPSON    pCO2 arterial 70.8 (HH) 32 - 48 mmHg    Comment: CRITICAL RESULT CALLED TO, READ BACK BY AND VERIFIED WITH: A.DUNLAP AT 1645 ON 09/05/20 BY N.THOMPSON    pO2, Arterial 162 (H) 83 - 108 mmHg   Bicarbonate 24.6 20.0 - 28.0 mmol/L   Acid-base deficit 5.4 (H) 0.0 - 2.0 mmol/L   O2 Saturation  98.6 %   Patient temperature 98.6     Comment: Performed at [facility], [address] [place], [place] [address]  Glucose, capillary     Status: Abnormal   Collection Time: 09/05/20  4:41 PM  Result Value Ref Range   Glucose-Capillary 186 (H) 70 - 99 mg/dL    Comment: Glucose reference range applies only to samples taken after fasting for at least 8 hours.   Comment 1 Notify RN    Comment 2 Document in Chart   Glucose, capillary     Status: Abnormal   Collection Time: 09/05/20  7:49 PM  Result Value Ref Range   Glucose-Capillary 205 (H) 70 - 99 mg/dL    Comment: Glucose reference range applies only to samples taken after fasting for at least 8 hours.  Glucose, capillary     Status: Abnormal   Collection Time: 09/05/20 11:17 PM  Result Value Ref Range   Glucose-Capillary 231 (H) 70 - 99 mg/dL    Comment: Glucose reference range applies only to samples taken after fasting for at least 8 hours.  Glucose, capillary     Status: Abnormal   Collection Time: 09/05/2020  3:30 AM  Result Value Ref Range   Glucose-Capillary 236 (H) 70 - 99 mg/dL    Comment: Glucose reference range applies only to samples taken after fasting for at least 8 hours.  CBC with Differential/Platelet     Status: Abnormal   Collection Time: 09/10/2020  3:34 AM  Result Value Ref Range   WBC 14.5 (H) 4.0 - 10.5 K/uL   RBC 4.50 4.22 - 5.81 MIL/uL   Hemoglobin 13.8 13.0 - 17.0 g/dL   HCT 95.1 39 - 52 %   MCV 97.6 80.0 - 100.0 fL   MCH 30.7 26.0 - 34.0 pg   MCHC 31.4 30.0 - 36.0 g/dL   RDW 88.4 16.6 - 06.3 %   Platelets 173 150 - 400 K/uL   nRBC 0.0 0.0 - 0.2 %   Neutrophils Relative % 83 %   Neutro Abs 12.0 (H) 1.7 - 7.7 K/uL   Lymphocytes Relative 7 %   Lymphs Abs 1.1 0.7 - 4.0 K/uL   Monocytes Relative 6 %   Monocytes Absolute 0.8 0.1 - 1.0 K/uL   Eosinophils Relative 0 %   Eosinophils Absolute 0.0 0.0 - 0.5 K/uL   Basophils Relative 0 %   Basophils Absolute 0.1 0.0 - 0.1 K/uL   WBC  Morphology MILD LEFT SHIFT (1-5% METAS, OCC MYELO, OCC BANDS)     Comment: TOXIC GRANULATION VACUOLATED NEUTROPHILS  Immature Granulocytes 4 %   Abs Immature Granulocytes 0.60 (H) 0.00 - 0.07 K/uL    Comment: Performed at [facility], [address], [place], [place] [address]  Comprehensive metabolic panel     Status: Abnormal   Collection Time: 09/25/2020  3:34 AM  Result Value Ref Range   Sodium 139 135 - 145 mmol/L   Potassium 4.9 3.5 - 5.1 mmol/L   Chloride 107 98 - 111 mmol/L   CO2 25 22 - 32 mmol/L   Glucose, Bld 253 (H) 70 - 99 mg/dL    Comment: Glucose reference range applies only to samples taken after fasting for at least 8 hours.   BUN 41 (H) 6 - 20 mg/dL   Creatinine, Ser 6.06 (H) 0.61 - 1.24 mg/dL   Calcium 8.4 (L) 8.9 - 10.3 mg/dL   Total Protein 5.0 (L) 6.5 - 8.1 g/dL   Albumin 2.3 (L) 3.5 - 5.0 g/dL   AST 38 15 - 41 U/L   ALT 41 0 - 44 U/L   Alkaline Phosphatase 107 38 - 126 U/L   Total Bilirubin 0.7 0.3 - 1.2 mg/dL   GFR, Estimated >30 >16 mL/min    Comment: (NOTE) Calculated using the CKD-EPI Creatinine Equation (2021)    Anion gap 7 5 - 15    Comment: Performed at [facility], [address], [place], [place] [address]  C-reactive protein     Status: Abnormal   Collection Time: 09/18/2020  3:34 AM  Result Value Ref Range   CRP 4.0 (H) <1.0 mg/dL    Comment: Performed at [facility], [address], [place], [place] [address]  D-dimer, quantitative (not at [facility]     Status: Abnormal   Collection Time: 09/07/2020  3:34 AM  Result Value Ref Range   D-Dimer, Quant >20.00 (H) 0.00 - 0.50 ug/mL-FEU    Comment: (NOTE) At the manufacturer cut-off value of 0.5 g/mL FEU, this assay has a negative predictive value of 95-100%.This assay is intended for use in conjunction with a clinical pretest probability (PTP) assessment model to exclude pulmonary embolism (PE) and deep venous thrombosis (DVT) in outpatients suspected of PE or DVT. Results  should be correlated with clinical presentation. Performed at [facility], [address] [place], [place] [address]   Procalcitonin     Status: None   Collection Time: 09/12/2020  3:34 AM  Result Value Ref Range   Procalcitonin 1.47 ng/mL    Comment:        Interpretation: PCT > 0.5 ng/mL and <= 2 ng/mL: Systemic infection (sepsis) is possible, but other conditions are known to elevate PCT as well. (NOTE)       Sepsis PCT Algorithm           Lower Respiratory Tract                                      Infection PCT Algorithm    ----------------------------     ----------------------------         PCT < 0.25 ng/mL                PCT < 0.10 ng/mL          Strongly encourage             Strongly discourage   discontinuation of antibiotics    initiation of antibiotics    ----------------------------     -----------------------------  PCT 0.25 - 0.50 ng/mL            PCT 0.10 - 0.25 ng/mL               OR       >80% decrease in PCT            Discourage initiation of                                            antibiotics      Encourage discontinuation           of antibiotics    ----------------------------     -----------------------------         PCT >= 0.50 ng/mL              PCT 0.26 - 0.50 ng/mL                AND       <80% decrease in PCT             Encourage initiation of                                             antibiotics       Encourage continuation           of antibiotics    ----------------------------     -----------------------------        PCT >= 0.50 ng/mL                  PCT > 0.50 ng/mL               AND         increase in PCT                  Strongly encourage                                      initiation of antibiotics    Strongly encourage escalation           of antibiotics                                     -----------------------------                                           PCT <= 0.25 ng/mL                                                  OR                                        > 80% decrease in PCT  Discontinue / Do not initiate                                             antibiotics  Performed at [facility], [address] [place], [place] [address]   Magnesium     Status: Abnormal   Collection Time: 07-Sep-2020  3:34 AM  Result Value Ref Range   Magnesium 2.9 (H) 1.7 - 2.4 mg/dL    Comment: Performed at [facility], [address], [place], [place] [address]  Phosphorus     Status: None   Collection Time: 07-Sep-2020  3:34 AM  Result Value Ref Range   Phosphorus 4.3 2.5 - 4.6 mg/dL    Comment: Performed at [facility], [address] [place], [place] [address]  Hemoglobin A1c     Status: Abnormal   Collection Time: September 07, 2020  3:34 AM  Result Value Ref Range   Hgb A1c MFr Bld 6.0 (H) 4.8 - 5.6 %    Comment: (NOTE) Pre diabetes:          5.7%-6.4%  Diabetes:              >6.4%  Glycemic control for   <7.0% adults with diabetes    Mean Plasma Glucose 125.5 mg/dL    Comment: Performed at [facility], [address], [place], [place] [address]    DG Abd 1 View  Result Date: 09/04/2020 CLINICAL DATA:  OG tube placement EXAM: ABDOMEN - 1 VIEW COMPARISON:  None. FINDINGS: The OG tube tip terminates over the distal gastric body/antrum. The tip is pointed distally. Diffuse hazy bilateral airspace opacities are again noted. IMPRESSION: OG tube projects over the stomach. Electronically Signed   By: Katherine Mantle M.D.   On: 09/04/2020 20:20   DG CHEST PORT 1 VIEW  Result Date: 09/05/2020 CLINICAL DATA:  Central line placement.  COVID-19 positive. EXAM: PORTABLE CHEST 1 VIEW COMPARISON:  09/05/2020 at 0525 hours FINDINGS: Endotracheal tube remains in place, terminating 4.3 cm above the carina. Enteric tube courses into the abdomen with tip not imaged. A new left jugular catheter terminates over the mid SVC. The cardiac silhouette is mildly  enlarged. Widespread interstitial and airspace opacities are again seen bilaterally. There is overall improved lung aeration bilaterally, particularly in the left lung base. No large pleural effusion or pneumothorax is identified. IMPRESSION: 1. Interval left jugular catheter placement as above. 2. Extensive bilateral pneumonia with overall mildly improved lung aeration. Electronically Signed   By: Sebastian Ache M.D.   On: 09/05/2020 11:03   DG CHEST PORT 1 VIEW  Result Date: 09/05/2020 CLINICAL DATA:  ARDS.  COVID. EXAM: PORTABLE CHEST 1 VIEW COMPARISON:  Chest x-ray 09/04/2020. FINDINGS: Endotracheal tube and NG tube in stable position. Stable cardiomegaly. Diffuse unchanged severe bilateral pulmonary infiltrates/edema. Persistent left-sided small to moderate pleural effusion. No pneumothorax. IMPRESSION: 1. Lines and tubes in stable position. 2. Stable cardiomegaly. 3. Diffuse unchanged severe bilateral pulmonary infiltrates/edema. Persistent left-sided pleural effusion. Electronically Signed   By: Maisie Fus  Register   On: 09/05/2020 06:38   DG CHEST PORT 1 VIEW  Result Date: 09/04/2020 CLINICAL DATA:  COVID positive. EXAM: PORTABLE CHEST 1 VIEW COMPARISON:  September 02, 2020 FINDINGS: The endotracheal tube terminates above the carina the 0.8 cm. The enteric tube extends below the left hemidiaphragm. There are diffuse bilateral  hazy airspace opacities which have substantially worsened since the prior chest x-ray. There is no pneumothorax or large pleural effusion. The heart size is stable. IMPRESSION: 1. Lines and tubes as above. The endotracheal tube terminates above the carina. 2. Worsening bilateral airspace opacities consistent with multifocal pneumonia. Electronically Signed   By: Katherine Mantle M.D.   On: 09/04/2020 20:19    Review of Systems: cannot assess Blood pressure (!) 132/56, pulse 60, temperature 97.8 F (36.6 C), temperature source Axillary, resp. rate (!) 35, height 5\' 9"  (1.753 m),  weight 103.6 kg, SpO2 95 %. Physical Exam  Sedated/paralyzed No edema Lung sounds diminished CVL CDI No peripheral edema  BUN/Cr slightly up Sugars up D dimer markedly elevated Pct up on cefepime  Assessment Severe COVID ARDS Elevated D dimer question superimposed VTE Elevated Pct question superimposed CAP  Plan - Stat echo, LE duplex BL -  Assuming results of above unremarkable, should be good candidate for VV ECMO, will discuss with rest of team.  I have called wife and discussed risks/benefits of cannulation and VV ECMO, she is agreeable to this if offered. - Continue cefepime, check sputum culture  Lorin Glass Date: 2020/10/01 Time: 7:13 AM

## 2020-09-06 NOTE — Progress Notes (Signed)
Pt transported on the ventilator from cath lab suite to 2H23 post ECMO cannulation. VSS throughout, RT, ECMO Coordinator and RN x3 accompanied pt. Report given to 2H RT upon arrival. RT will continue to monitor and be available as needed.

## 2020-09-06 NOTE — CV Procedure (Signed)
  ECMO INITIATION   Patient: Donald Rowe, 07-26-1961, 59 y.o. Location:   Date of Service:  08/31/2020     Time: 4:52 PM  Date of Admission: 09-27-20 Admitting diagnosis: Acute hypoxemic respiratory failure due to COVID-19 (HCC)  Ht: 5\' 9"  (175.3 cm) Wt: 103.6 kg BSA: Body surface area is 2.25 meters squared.  Blood Type: O POS Allergies: No Known Allergies  Past medical history: History reviewed. No pertinent past medical history. Past surgical history: History reviewed. No pertinent surgical history.  Indication for ECMO: ARDS  ECMO was deployed at 1400 and initiated at 1600  Anticoagulation achieved with Heparin bolus of 7500 units given to patient at 1543. Cannulated for ECMO Mode: VV and achieved initial ECMO Flow (LPM): 4.96 and ECMO Sweep Gas (LPM): 2.    ECMO Cannula Information     Staff Present  Primary Perfusionist . Romeo Rabon, Bruna Potter  Assisting Perfusionist/ECMO Specialist N/A  Cannulating Physician Dr. [place]   ECMO Lot Numbers  CardioHelp Console  Cardiohelp 2  Oxygenator  Vickey Sages  Tubing Pack  [id number]  ECMO Goals  Flow goal   Flow Goal: SpO2>90%  Anticoagulation goal   Anticoagulation Goal: PTT 60-70  Cardiac goal      Respiratory goal   Respiratory Goal: Normalize  Other goal       ECMO Handoff  Patient Information * Age Height Weight BSA IBW BMI  59 y.o. 5\' 9"  (175.3 cm)  (103.6 kg Body surface area is 2.25 meters squared. No data recorded Body mass index is 33.73 kg/m.   Review History * Primary Diagnosis   Acute hypoxemic respiratory failure due to COVID-19 [facility])  Prior Cardiac Arrest within 24hrs of ECMO initiation? No  ECMO and MCS * Type ECMO Flow ECMO Sweep Gases   ECMO Device: Cardiohelp   Flow (LPM): 4.96   Sweep Gas (LPM): 2     Additional Mechanical Support   Ventilation *    $ Ventilator Initial/Subsequent : Subsequent, Vent Mode: PRVC, Vt Set: 400 mL, Set Rate: 35 bmp, FiO2 (%): (S) 60 %, I Time: 1 Sec(s),  PEEP: 14 cmH20     Cannula Size and Locations       Drainage  32 Fr. [place]  Return  32 [address]. [address]   *Cannula(e) sutured and anchored, secured and dressed.   Labs and Imaging *  *Cannulation position verified via imaging on arrival to ICU. Concerns communicated to attending surgeon. Labs reviewed.   All ECMO safety checks complete. ECMO flowsheet initiated, applicable charges captured, LDA's entered/confirmed, imaging and labs verified, blood products available, and report given to .

## 2020-09-06 NOTE — Progress Notes (Signed)
  Echocardiogram 2D Echocardiogram has been performed.  Pieter Partridge September 20, 2020, 8:46 AM

## 2020-09-06 NOTE — Progress Notes (Addendum)
NAME:  Donald Rowe, MRN:  [id number], DOB:  1961/09/06, LOS: 4 ADMISSION DATE:  01-Oct-2020, CONSULTATION DATE:  12/6 REFERRING MD:  Dr. David Stall, CHIEF COMPLAINT:  SOB    Brief History   59 y/o M admitted 12/4 with 1 week hx of SOB, known COVID positive.  He is unvaccinated.  CTA chest negative for PE but demonstrated diffuse bilateral infiltrates.  Developed worsening confusion and hypoxia on 12/6 requiring intubation.  Patient previously DNR/ DNI but consented to short term therapies.  He since has had refractory hypoxia despite NMB and proning.  Transferred to [facility] 12/7 for anticipation of ECMO consult.   History of present illness   59 y/o unvaccinated male, admitted 12/4 with a one week history of shortness of breath.    The patient began feeling poorly the day before Thanksgiving with fever, weakness and body aches. He tested positive for COVID on 11/27 prior to presentation.  He developed progressive shortness of breath.  In the ER, the patient was found to have saturations of 78%.  He was started on 6L O2 with initial improvement in saturations.  The patient was admitted per [facility]. CTA of the Chest was assessed and negative for pulmonary embolism but showed diffuse bilateral infiltrates. He was treated with IV remdesivir, steroids and baricitinib.  Initially, he was also treated with antibiotics but these were stopped on 12/6 with a low procalcitonin.  LE doppler negative for DVT. The patient elected to be DNR / no intubation.  He developed worsening respiratory failure with hypoxia requiring 60L flow 100% and PCCM consulted 12/6 for evaluation.  Patient changed his decision for DNR/ DNI and agreed to intubation but no prolonged support on 12/6.  He developed worsening hypoxia and confusion requiring emergent intubation later that day.  He since has refractory hypoxia despite NMB and proning.  Transferred to [facility] for ECMO consult.   Past Medical History  Hypothyroidism  HLD   Significant  Hospital Events   12/04 Admit  12/06 PCCM consulted / intubated  12/7 transferred to [facility] for ECMO consult  Consults:  PCCM ECMO 12/8  Procedures:  12/6 ETT >> 12/7 R radial aline >> 12/7 L IJ CVL >>  Significant Diagnostic Tests:   CTA Chest 12/4 >> extensive bilateral airspace disease, no large PE identified, limited study   LE Venous Duplex 12/4 >> negative for DVT bilaterally   Limited TTE 12/8 >>  LE venous duplex 12/8 >>  Micro Data:  COVID 12/4 >> negative  Influenza A/B 12/4 >> negative  MRSA PCR 12/5 >> negative  BCx2 12/4 >>  Trach asp 12/7 >>  Antimicrobials:  Azithromycin 12/5 >> 12/6  Ceftriaxone 12/5 >> 12/6  Cefepime 12/7 >>  12/4 remdesivir >> 12/5 baricitinib >> 12/5 solumedrol >>  Interim history/subjective:  No events overnight  Off NE, remains on fentanyl, propofol, and nimbex  Supine ~0630 CVP 3 Slightly up sCr 0.88-> 0.96-> 1.25  Objective   Blood pressure (!) 122/55, pulse 60, temperature (!) 96.1 F (35.6 C), temperature source Axillary, resp. rate (!) 35, height 5\' 9"  (1.753 m), weight 103.6 kg, SpO2 97 %.    Vent Mode: PRVC FiO2 (%):  [70 %-80 %] 70 % Set Rate:  [35 bmp] 35 bmp Vt Set:  [400 mL] 400 mL PEEP:  [14 cmH20-16 cmH20] 14 cmH20 Plateau Pressure:  [24 cmH20-28 cmH20] 24 cmH20   Intake/Output Summary (Last 24 hours) at 09/12/2020 14/04/2020 Last data filed at 09/15/2020 0800 Gross per 24 hour  Intake 2897.86 ml  Output 2115 ml  Net 782.86 ml   Filed Weights   09/03/20 1700 09/26/2020 0342  Weight: 101.9 kg 103.6 kg    Examination: General:  Critically ill male intubated, sedated, on NMB in NAD HEENT: MM pink/moist, ETT/ OGT, pinpoint pupils  Neuro: RASS -5 CV: rr, no murmur, NSR/ borderline SB PULM:  MV supported breaths, CTA, Pplat 28, DP 13, PF ratio this am 157 GI: soft, ND, +BS active, foley  Extremities: warm/dry, no LE edema  Skin: no rashes  Resolved Hospital Problem list      Assessment & Plan:    Acute hypoxemic respiratory failure secondary to COVID PNA with severe ARDS.  - continue with LVTVV, currently on 5.5 cc/kg IBW - goal Pplat < 30, DP < 15 - allow for permissive hypercapina - titrate PEEP/ FiO2 per ARDS protocol  - hold on further proning, PF ratio 152 - s/p ECMO consult- plans for VV ECMO cannulation today-> pending stat TTE/ LE BL duplex - continue solumedrol, baricitinib and remdesivir -PAD protocol with propofol/ fentanyl gtt, continue NMB and RASS -5 for vent dyssynchrony w/ bowel regimen with enteral klonopin and oxy - PCT .018-> 0.57-> 3.42-> 1.47 - continue cefepime and follow trach and blood cultures  - VAP bundle/ PPI  - trending inflammatory markers - continue airborne and contact precautions   Shock - resolving - off NE this morning - continue goal MAP > 65 - trending CVP   AKI secondary to hypotension - UOP remains ~0.9 ml/kg/hr  - continue hemodynamic support - continue foley, strict I/Os - trend renal indices, if increasing sCr will need to get renal ultrasound/ ulytes  Hyperglycemia, exacerbated by critical illness/ steroids - starting insulin gtt   Elevated transaminases likely 2/2 acute illness, possibly med side effect. -LFTs normalized, continue to monitor   Elevated d-dimer -LE [place] neg 12/4; repeat testing pending (prelim positive for bilateral LE DVT, will need to defer to ECMO team for anticoagulation)   At risk for protein calorie malnutrition - on TF, currently on hold pending OR  - continue free water 200 ml q 4hr  Hypothyroidism  - continue daily synthroid  Best practice (evaluated daily)  Diet: NPO, TF Pain/Anxiety/Delirium protocol (if indicated) Propofol/ fentanyl; RASS goal -5 on NMB VAP protocol (if indicated): yes DVT prophylaxis: pending transition to systemic after cannulation  GI prophylaxis: pantoprazole  Glucose control: SSI-> transition to insulin gtt Mobility: bedrest Last date of multidisciplinary goals of  care discussion: wife updated over the phone 12/7 Family and staff present RN, physicain Summary of discussion con't aggressive care Follow up goals of care discussion due: 12/15 Code Status: full Disposition: ICU  Labs   CBC: Recent Labs  Lab Sep 14, 2020 1242 09/03/20 0143 09/04/20 0255 09/05/20 0258 09/17/2020 0334  WBC 8.1 11.6* 17.6* 17.4* 14.5*  NEUTROABS 6.1 9.7* 14.6* 15.0* 12.0*  HGB 15.2 14.8 14.2 13.5 13.8  HCT 44.4 43.9 42.3 40.8 43.9  MCV 91.5 91.8 91.8 92.5 97.6  PLT 139* 150 150 152 173    Basic Metabolic Panel: Recent Labs  Lab 09/14/20 1242 09/03/20 0143 09/04/20 0255 09/05/20 0258 09/07/2020 0334  NA 133* 134* 136 137 139  K 4.4 3.9 4.1 4.4 4.9  CL 99 100 104 104 107  CO2 24 23 22 24 25   GLUCOSE 107* 118* 140* 171* 253*  BUN 14 15 23* 31* 41*  CREATININE 1.05 1.08 0.88 0.96 1.25*  CALCIUM 8.6* 8.4* 8.2* 8.3* 8.4*  MG  --  1.8  --   --  2.9*  PHOS  --  1.4*  --   --  4.3   GFR: Estimated Creatinine Clearance: 75.5 mL/min (A) (by C-G formula based on SCr of 1.25 mg/dL (H)). Recent Labs  Lab 09/20/2020 1242 09/20/2020 1242 09/01/2020 2247 09/03/20 0143 09/04/20 0255 09/05/20 0258 09/13/2020 0334  PROCALCITON 0.18  --   --  0.57  --  3.42 1.47  WBC 8.1   < >  --  11.6* 17.6* 17.4* 14.5*  LATICACIDVEN 1.5  --  2.2* 2.1*  2.1*  --   --   --    < > = values in this interval not displayed.    Liver Function Tests: Recent Labs  Lab 09/20/2020 1242 09/03/20 0143 09/04/20 0255 09/05/20 0258 09/05/2020 0334  AST 68* 96* 98* 71* 38  ALT 44 53* 57* 51* 41  ALKPHOS 118 140* 114 116 107  BILITOT 0.9 0.8 0.9 0.8 0.7  PROT 6.5 6.3* 5.4* 5.2* 5.0*  ALBUMIN 3.3* 3.2* 2.7* 2.5* 2.3*   No results for input(s): LIPASE, AMYLASE in the last 168 hours. No results for input(s): AMMONIA in the last 168 hours.  ABG    Component Value Date/Time   PHART 7.167 (LL) 09/05/2020 1530   PCO2ART 70.8 (HH) 09/05/2020 1530   PO2ART 162 (H) 09/05/2020 1530   HCO3 24.6  09/05/2020 1530   ACIDBASEDEF 5.4 (H) 09/05/2020 1530   O2SAT 98.6 09/05/2020 1530     Coagulation Profile: No results for input(s): INR, PROTIME in the last 168 hours.  Cardiac Enzymes: No results for input(s): CKTOTAL, CKMB, CKMBINDEX, TROPONINI in the last 168 hours.  HbA1C: Hgb A1c MFr Bld  Date/Time Value Ref Range Status  09/13/2020 03:34 AM 6.0 (H) 4.8 - 5.6 % Final    Comment:    (NOTE) Pre diabetes:          5.7%-6.4%  Diabetes:              >6.4%  Glycemic control for   <7.0% adults with diabetes     CBG: Recent Labs  Lab 09/05/20 1641 09/05/20 1949 09/05/20 2317 09/22/2020 0330 09/14/2020 0728  GLUCAP 186* 205* 231* 236* 205*   CCT: 35 mins  Posey Boyer, ACNP [facility] 09/01/2020, 8:28 AM

## 2020-09-07 ENCOUNTER — Inpatient Hospital Stay (HOSPITAL_COMMUNITY): Payer: HRSA Program

## 2020-09-07 ENCOUNTER — Encounter (HOSPITAL_COMMUNITY): Payer: Self-pay | Admitting: Internal Medicine

## 2020-09-07 DIAGNOSIS — U071 COVID-19: Secondary | ICD-10-CM | POA: Diagnosis not present

## 2020-09-07 DIAGNOSIS — J9601 Acute respiratory failure with hypoxia: Secondary | ICD-10-CM | POA: Diagnosis not present

## 2020-09-07 DIAGNOSIS — R4182 Altered mental status, unspecified: Secondary | ICD-10-CM

## 2020-09-07 LAB — GLUCOSE, CAPILLARY
Glucose-Capillary: 149 mg/dL — ABNORMAL HIGH (ref 70–99)
Glucose-Capillary: 202 mg/dL — ABNORMAL HIGH (ref 70–99)
Glucose-Capillary: 206 mg/dL — ABNORMAL HIGH (ref 70–99)
Glucose-Capillary: 206 mg/dL — ABNORMAL HIGH (ref 70–99)
Glucose-Capillary: 200 mg/dL — ABNORMAL HIGH (ref 70–99)
Glucose-Capillary: 178 mg/dL — ABNORMAL HIGH (ref 70–99)
Glucose-Capillary: 180 mg/dL — ABNORMAL HIGH (ref 70–99)
Glucose-Capillary: 178 mg/dL — ABNORMAL HIGH (ref 70–99)
Glucose-Capillary: 172 mg/dL — ABNORMAL HIGH (ref 70–99)
Glucose-Capillary: 169 mg/dL — ABNORMAL HIGH (ref 70–99)
Glucose-Capillary: 139 mg/dL — ABNORMAL HIGH (ref 70–99)
Glucose-Capillary: 134 mg/dL — ABNORMAL HIGH (ref 70–99)
Glucose-Capillary: 147 mg/dL — ABNORMAL HIGH (ref 70–99)
Glucose-Capillary: 165 mg/dL — ABNORMAL HIGH (ref 70–99)
Glucose-Capillary: 176 mg/dL — ABNORMAL HIGH (ref 70–99)
Glucose-Capillary: 147 mg/dL — ABNORMAL HIGH (ref 70–99)

## 2020-09-07 LAB — CBC
HCT: 32.6 % — ABNORMAL LOW (ref 39.0–52.0)
HCT: 33 % — ABNORMAL LOW (ref 39.0–52.0)
Hemoglobin: 10.5 g/dL — ABNORMAL LOW (ref 13.0–17.0)
Hemoglobin: 10.6 g/dL — ABNORMAL LOW (ref 13.0–17.0)
MCH: 30.5 pg (ref 26.0–34.0)
MCH: 30.6 pg (ref 26.0–34.0)
MCHC: 32.1 g/dL (ref 30.0–36.0)
MCHC: 32.2 g/dL (ref 30.0–36.0)
MCV: 94.8 fL (ref 80.0–100.0)
MCV: 95.4 fL (ref 80.0–100.0)
Platelets: 138 10*3/uL — ABNORMAL LOW (ref 150–400)
Platelets: 146 10*3/uL — ABNORMAL LOW (ref 150–400)
RBC: 3.44 MIL/uL — ABNORMAL LOW (ref 4.22–5.81)
RBC: 3.46 MIL/uL — ABNORMAL LOW (ref 4.22–5.81)
RDW: 13.2 % (ref 11.5–15.5)
RDW: 13.4 % (ref 11.5–15.5)
WBC: 5.8 10*3/uL (ref 4.0–10.5)
WBC: 9.9 10*3/uL (ref 4.0–10.5)
nRBC: 0 % (ref 0.0–0.2)
nRBC: 0 % (ref 0.0–0.2)

## 2020-09-07 LAB — CULTURE, BLOOD (ROUTINE X 2)
Culture: NO GROWTH
Culture: NO GROWTH
Special Requests: ADEQUATE
Special Requests: ADEQUATE

## 2020-09-07 LAB — POCT I-STAT 7, (LYTES, BLD GAS, ICA,H+H)
Acid-Base Excess: 0 mmol/L (ref 0.0–2.0)
Acid-Base Excess: 0 mmol/L (ref 0.0–2.0)
Acid-Base Excess: 1 mmol/L (ref 0.0–2.0)
Acid-Base Excess: 1 mmol/L (ref 0.0–2.0)
Acid-base deficit: 1 mmol/L (ref 0.0–2.0)
Acid-base deficit: 1 mmol/L (ref 0.0–2.0)
Acid-base deficit: 1 mmol/L (ref 0.0–2.0)
Acid-base deficit: 2 mmol/L (ref 0.0–2.0)
Acid-base deficit: 3 mmol/L — ABNORMAL HIGH (ref 0.0–2.0)
Acid-base deficit: 9 mmol/L — ABNORMAL HIGH (ref 0.0–2.0)
Bicarbonate: 16.5 mmol/L — ABNORMAL LOW (ref 20.0–28.0)
Bicarbonate: 22.9 mmol/L (ref 20.0–28.0)
Bicarbonate: 23.1 mmol/L (ref 20.0–28.0)
Bicarbonate: 23.2 mmol/L (ref 20.0–28.0)
Bicarbonate: 25.1 mmol/L (ref 20.0–28.0)
Bicarbonate: 25.4 mmol/L (ref 20.0–28.0)
Bicarbonate: 25.5 mmol/L (ref 20.0–28.0)
Bicarbonate: 26 mmol/L (ref 20.0–28.0)
Bicarbonate: 26.4 mmol/L (ref 20.0–28.0)
Bicarbonate: 27.2 mmol/L (ref 20.0–28.0)
Calcium, Ion: 1.04 mmol/L — ABNORMAL LOW (ref 1.15–1.40)
Calcium, Ion: 1.13 mmol/L — ABNORMAL LOW (ref 1.15–1.40)
Calcium, Ion: 1.23 mmol/L (ref 1.15–1.40)
Calcium, Ion: 1.25 mmol/L (ref 1.15–1.40)
Calcium, Ion: 1.26 mmol/L (ref 1.15–1.40)
Calcium, Ion: 1.27 mmol/L (ref 1.15–1.40)
Calcium, Ion: 1.27 mmol/L (ref 1.15–1.40)
Calcium, Ion: 1.28 mmol/L (ref 1.15–1.40)
Calcium, Ion: 1.29 mmol/L (ref 1.15–1.40)
Calcium, Ion: 1.32 mmol/L (ref 1.15–1.40)
HCT: 21 % — ABNORMAL LOW (ref 39.0–52.0)
HCT: 26 % — ABNORMAL LOW (ref 39.0–52.0)
HCT: 26 % — ABNORMAL LOW (ref 39.0–52.0)
HCT: 27 % — ABNORMAL LOW (ref 39.0–52.0)
HCT: 28 % — ABNORMAL LOW (ref 39.0–52.0)
HCT: 28 % — ABNORMAL LOW (ref 39.0–52.0)
HCT: 28 % — ABNORMAL LOW (ref 39.0–52.0)
HCT: 28 % — ABNORMAL LOW (ref 39.0–52.0)
HCT: 28 % — ABNORMAL LOW (ref 39.0–52.0)
HCT: 29 % — ABNORMAL LOW (ref 39.0–52.0)
Hemoglobin: 7.1 g/dL — ABNORMAL LOW (ref 13.0–17.0)
Hemoglobin: 8.8 g/dL — ABNORMAL LOW (ref 13.0–17.0)
Hemoglobin: 8.8 g/dL — ABNORMAL LOW (ref 13.0–17.0)
Hemoglobin: 9.2 g/dL — ABNORMAL LOW (ref 13.0–17.0)
Hemoglobin: 9.5 g/dL — ABNORMAL LOW (ref 13.0–17.0)
Hemoglobin: 9.5 g/dL — ABNORMAL LOW (ref 13.0–17.0)
Hemoglobin: 9.5 g/dL — ABNORMAL LOW (ref 13.0–17.0)
Hemoglobin: 9.5 g/dL — ABNORMAL LOW (ref 13.0–17.0)
Hemoglobin: 9.5 g/dL — ABNORMAL LOW (ref 13.0–17.0)
Hemoglobin: 9.9 g/dL — ABNORMAL LOW (ref 13.0–17.0)
O2 Saturation: 90 %
O2 Saturation: 90 %
O2 Saturation: 91 %
O2 Saturation: 92 %
O2 Saturation: 92 %
O2 Saturation: 92 %
O2 Saturation: 96 %
O2 Saturation: 98 %
O2 Saturation: 98 %
O2 Saturation: 98 %
Patient temperature: 35.9
Patient temperature: 36.3
Patient temperature: 36.3
Patient temperature: 36.4
Patient temperature: 36.4
Patient temperature: 36.5
Patient temperature: 36.6
Patient temperature: 37
Patient temperature: 37.1
Potassium: 3 mmol/L — ABNORMAL LOW (ref 3.5–5.1)
Potassium: 3.9 mmol/L (ref 3.5–5.1)
Potassium: 3.9 mmol/L (ref 3.5–5.1)
Potassium: 4 mmol/L (ref 3.5–5.1)
Potassium: 4.1 mmol/L (ref 3.5–5.1)
Potassium: 4.2 mmol/L (ref 3.5–5.1)
Potassium: 4.2 mmol/L (ref 3.5–5.1)
Potassium: 4.2 mmol/L (ref 3.5–5.1)
Potassium: 4.3 mmol/L (ref 3.5–5.1)
Potassium: 4.4 mmol/L (ref 3.5–5.1)
Sodium: 145 mmol/L (ref 135–145)
Sodium: 145 mmol/L (ref 135–145)
Sodium: 146 mmol/L — ABNORMAL HIGH (ref 135–145)
Sodium: 147 mmol/L — ABNORMAL HIGH (ref 135–145)
Sodium: 149 mmol/L — ABNORMAL HIGH (ref 135–145)
Sodium: 150 mmol/L — ABNORMAL HIGH (ref 135–145)
Sodium: 150 mmol/L — ABNORMAL HIGH (ref 135–145)
Sodium: 150 mmol/L — ABNORMAL HIGH (ref 135–145)
Sodium: 151 mmol/L — ABNORMAL HIGH (ref 135–145)
Sodium: 152 mmol/L — ABNORMAL HIGH (ref 135–145)
TCO2: 17 mmol/L — ABNORMAL LOW (ref 22–32)
TCO2: 24 mmol/L (ref 22–32)
TCO2: 24 mmol/L (ref 22–32)
TCO2: 24 mmol/L (ref 22–32)
TCO2: 26 mmol/L (ref 22–32)
TCO2: 27 mmol/L (ref 22–32)
TCO2: 27 mmol/L (ref 22–32)
TCO2: 27 mmol/L (ref 22–32)
TCO2: 28 mmol/L (ref 22–32)
TCO2: 29 mmol/L (ref 22–32)
pCO2 arterial: 30.9 mmHg — ABNORMAL LOW (ref 32.0–48.0)
pCO2 arterial: 35 mmHg (ref 32.0–48.0)
pCO2 arterial: 36.7 mmHg (ref 32.0–48.0)
pCO2 arterial: 42.9 mmHg (ref 32.0–48.0)
pCO2 arterial: 44.9 mmHg (ref 32.0–48.0)
pCO2 arterial: 45 mmHg (ref 32.0–48.0)
pCO2 arterial: 46.3 mmHg (ref 32.0–48.0)
pCO2 arterial: 47.4 mmHg (ref 32.0–48.0)
pCO2 arterial: 48.4 mmHg — ABNORMAL HIGH (ref 32.0–48.0)
pCO2 arterial: 52.4 mmHg — ABNORMAL HIGH (ref 32.0–48.0)
pH, Arterial: 7.291 — ABNORMAL LOW (ref 7.350–7.450)
pH, Arterial: 7.316 — ABNORMAL LOW (ref 7.350–7.450)
pH, Arterial: 7.334 — ABNORMAL LOW (ref 7.350–7.450)
pH, Arterial: 7.345 — ABNORMAL LOW (ref 7.350–7.450)
pH, Arterial: 7.352 (ref 7.350–7.450)
pH, Arterial: 7.355 (ref 7.350–7.450)
pH, Arterial: 7.361 (ref 7.350–7.450)
pH, Arterial: 7.38 (ref 7.350–7.450)
pH, Arterial: 7.407 (ref 7.350–7.450)
pH, Arterial: 7.425 (ref 7.350–7.450)
pO2, Arterial: 104 mmHg (ref 83.0–108.0)
pO2, Arterial: 108 mmHg (ref 83.0–108.0)
pO2, Arterial: 112 mmHg — ABNORMAL HIGH (ref 83.0–108.0)
pO2, Arterial: 54 mmHg — ABNORMAL LOW (ref 83.0–108.0)
pO2, Arterial: 62 mmHg — ABNORMAL LOW (ref 83.0–108.0)
pO2, Arterial: 62 mmHg — ABNORMAL LOW (ref 83.0–108.0)
pO2, Arterial: 65 mmHg — ABNORMAL LOW (ref 83.0–108.0)
pO2, Arterial: 68 mmHg — ABNORMAL LOW (ref 83.0–108.0)
pO2, Arterial: 70 mmHg — ABNORMAL LOW (ref 83.0–108.0)
pO2, Arterial: 83 mmHg (ref 83.0–108.0)

## 2020-09-07 LAB — CBC WITH DIFFERENTIAL/PLATELET
Abs Immature Granulocytes: 0.4 10*3/uL — ABNORMAL HIGH (ref 0.00–0.07)
Basophils Absolute: 0 10*3/uL (ref 0.0–0.1)
Basophils Relative: 0 %
Eosinophils Absolute: 0 10*3/uL (ref 0.0–0.5)
Eosinophils Relative: 0 %
HCT: 31.3 % — ABNORMAL LOW (ref 39.0–52.0)
Hemoglobin: 10.4 g/dL — ABNORMAL LOW (ref 13.0–17.0)
Immature Granulocytes: 4 %
Lymphocytes Relative: 4 %
Lymphs Abs: 0.4 10*3/uL — ABNORMAL LOW (ref 0.7–4.0)
MCH: 31.5 pg (ref 26.0–34.0)
MCHC: 33.2 g/dL (ref 30.0–36.0)
MCV: 94.8 fL (ref 80.0–100.0)
Monocytes Absolute: 0.5 10*3/uL (ref 0.1–1.0)
Monocytes Relative: 6 %
Neutro Abs: 7.7 10*3/uL (ref 1.7–7.7)
Neutrophils Relative %: 86 %
Platelets: 141 10*3/uL — ABNORMAL LOW (ref 150–400)
RBC: 3.3 MIL/uL — ABNORMAL LOW (ref 4.22–5.81)
RDW: 13.2 % (ref 11.5–15.5)
WBC: 9 10*3/uL (ref 4.0–10.5)
nRBC: 0 % (ref 0.0–0.2)

## 2020-09-07 LAB — BASIC METABOLIC PANEL
Anion gap: 7 (ref 5–15)
BUN: 38 mg/dL — ABNORMAL HIGH (ref 6–20)
CO2: 24 mmol/L (ref 22–32)
Calcium: 7.8 mg/dL — ABNORMAL LOW (ref 8.9–10.3)
Chloride: 116 mmol/L — ABNORMAL HIGH (ref 98–111)
Creatinine, Ser: 0.96 mg/dL (ref 0.61–1.24)
GFR, Estimated: >60 mL/min (ref >60)
Glucose, Bld: 136 mg/dL — ABNORMAL HIGH (ref 70–99)
Potassium: 3.9 mmol/L (ref 3.5–5.1)
Sodium: 147 mmol/L — ABNORMAL HIGH (ref 135–145)

## 2020-09-07 LAB — PROCALCITONIN: Procalcitonin: 0.64 ng/mL

## 2020-09-07 LAB — COMPREHENSIVE METABOLIC PANEL
ALT: 28 U/L (ref 0–44)
AST: 28 U/L (ref 15–41)
Albumin: 2.6 g/dL — ABNORMAL LOW (ref 3.5–5.0)
Alkaline Phosphatase: 71 U/L (ref 38–126)
Anion gap: 6 (ref 5–15)
BUN: 40 mg/dL — ABNORMAL HIGH (ref 6–20)
CO2: 26 mmol/L (ref 22–32)
Calcium: 7.9 mg/dL — ABNORMAL LOW (ref 8.9–10.3)
Chloride: 112 mmol/L — ABNORMAL HIGH (ref 98–111)
Creatinine, Ser: 1.22 mg/dL (ref 0.61–1.24)
GFR, Estimated: >60 mL/min (ref >60)
Glucose, Bld: 214 mg/dL — ABNORMAL HIGH (ref 70–99)
Potassium: 4.3 mmol/L (ref 3.5–5.1)
Sodium: 144 mmol/L (ref 135–145)
Total Bilirubin: 0.8 mg/dL (ref 0.3–1.2)
Total Protein: 4.5 g/dL — ABNORMAL LOW (ref 6.5–8.1)

## 2020-09-07 LAB — FIBRINOGEN: Fibrinogen: 150 mg/dL — ABNORMAL LOW (ref 210–475)

## 2020-09-07 LAB — APTT
aPTT: 42 seconds — ABNORMAL HIGH (ref 24–36)
aPTT: 45 seconds — ABNORMAL HIGH (ref 24–36)
aPTT: 45 seconds — ABNORMAL HIGH (ref 24–36)

## 2020-09-07 LAB — PROTIME-INR
INR: 1.5 — ABNORMAL HIGH (ref 0.8–1.2)
Prothrombin Time: 17.3 seconds — ABNORMAL HIGH (ref 11.4–15.2)

## 2020-09-07 LAB — POCT ACTIVATED CLOTTING TIME: Activated Clotting Time: 267 seconds

## 2020-09-07 LAB — C-REACTIVE PROTEIN: CRP: 1.5 mg/dL — ABNORMAL HIGH (ref <1.0)

## 2020-09-07 LAB — HEPARIN LEVEL (UNFRACTIONATED): Heparin Unfractionated: <0.1 IU/mL — ABNORMAL LOW (ref 0.30–0.70)

## 2020-09-07 LAB — PHOSPHORUS: Phosphorus: 2 mg/dL — ABNORMAL LOW (ref 2.5–4.6)

## 2020-09-07 LAB — LACTATE DEHYDROGENASE: LDH: 369 U/L — ABNORMAL HIGH (ref 98–192)

## 2020-09-07 LAB — LACTIC ACID, PLASMA
Lactic Acid, Venous: 1.4 mmol/L (ref 0.5–1.9)
Lactic Acid, Venous: 1.8 mmol/L (ref 0.5–1.9)

## 2020-09-07 LAB — D-DIMER, QUANTITATIVE: D-Dimer, Quant: 14.34 ug/mL-FEU — ABNORMAL HIGH (ref 0.00–0.50)

## 2020-09-07 LAB — MAGNESIUM: Magnesium: 2.5 mg/dL — ABNORMAL HIGH (ref 1.7–2.4)

## 2020-09-07 LAB — TRIGLYCERIDES: Triglycerides: 113 mg/dL (ref <150)

## 2020-09-07 MED ORDER — PROSOURCE TF PO LIQD
45.0000 mL | Freq: Four times a day (QID) | ORAL | Status: DC
  Administered 2020-09-07 – 2020-09-08 (×5): 45 mL
  Filled 2020-09-07 (×5): qty 45

## 2020-09-07 MED ORDER — SODIUM CHLORIDE 0.9 % IV SOLN
0.2400 mg/kg/h | INTRAVENOUS | Status: DC
  Administered 2020-09-07 (×2): 0.07 mg/kg/h via INTRAVENOUS
  Administered 2020-09-07: 0.15 mg/kg/h via INTRAVENOUS
  Administered 2020-09-08: 0.2 mg/kg/h via INTRAVENOUS
  Administered 2020-09-09: 03:00:00 0.24 mg/kg/h via INTRAVENOUS
  Filled 2020-09-07 (×4): qty 250

## 2020-09-07 MED ORDER — DEXMEDETOMIDINE HCL IN NACL 400 MCG/100ML IV SOLN
0.4000 ug/kg/h | INTRAVENOUS | Status: DC
  Administered 2020-09-07: 0.4 ug/kg/h via INTRAVENOUS
  Administered 2020-09-08: 0.8 ug/kg/h via INTRAVENOUS
  Administered 2020-09-09: 20:00:00 1.2 ug/kg/h via INTRAVENOUS
  Administered 2020-09-09 (×2): 0.8 ug/kg/h via INTRAVENOUS
  Administered 2020-09-09: 16:00:00 1 ug/kg/h via INTRAVENOUS
  Administered 2020-09-09 (×2): 0.8 ug/kg/h via INTRAVENOUS
  Administered 2020-09-10: 05:00:00 0.7 ug/kg/h via INTRAVENOUS
  Filled 2020-09-07 (×2): qty 200
  Filled 2020-09-07 (×3): qty 100
  Filled 2020-09-07 (×2): qty 200
  Filled 2020-09-07 (×3): qty 100

## 2020-09-07 MED ORDER — SORBITOL 70 % SOLN
60.0000 mL | Freq: Every day | Status: DC
  Administered 2020-09-07 – 2020-09-08 (×2): 60 mL
  Filled 2020-09-07 (×2): qty 60

## 2020-09-07 MED ORDER — ROCURONIUM BROMIDE 10 MG/ML (PF) SYRINGE
PREFILLED_SYRINGE | INTRAVENOUS | Status: AC
  Administered 2020-09-07: 100 mg
  Filled 2020-09-07: qty 10

## 2020-09-07 MED ORDER — PIVOT 1.5 CAL PO LIQD
1000.0000 mL | ORAL | Status: DC
  Administered 2020-09-07: 1000 mL

## 2020-09-07 MED ORDER — ETOMIDATE 2 MG/ML IV SOLN
INTRAVENOUS | Status: AC
  Administered 2020-09-07: 20 mg
  Filled 2020-09-07: qty 20

## 2020-09-07 MED ORDER — METHYLPREDNISOLONE SODIUM SUCC 40 MG IJ SOLR
40.0000 mg | Freq: Two times a day (BID) | INTRAMUSCULAR | Status: DC
  Administered 2020-09-07 – 2020-09-09 (×4): 40 mg via INTRAVENOUS
  Filled 2020-09-07 (×4): qty 1

## 2020-09-07 MED ORDER — LABETALOL HCL 5 MG/ML IV SOLN
INTRAVENOUS | Status: AC
  Filled 2020-09-07: qty 4

## 2020-09-07 MED ORDER — LORAZEPAM 2 MG/ML IJ SOLN
2.0000 mg | Freq: Once | INTRAMUSCULAR | Status: AC
  Administered 2020-09-07: 2 mg via INTRAVENOUS
  Filled 2020-09-07: qty 1

## 2020-09-07 MED ORDER — LABETALOL HCL 5 MG/ML IV SOLN
20.0000 mg | Freq: Once | INTRAVENOUS | Status: AC
  Administered 2020-09-07: 20 mg via INTRAVENOUS

## 2020-09-07 NOTE — Progress Notes (Signed)
Patient was transported to CT with prmiary RN, ECMO specialist & back to [phone] without any complications.

## 2020-09-07 NOTE — Progress Notes (Signed)
NAME:  Donald Rowe, MRN:  [id number], DOB:  [birth year], LOS: 5 ADMISSION DATE:  09/11/2020, CONSULTATION DATE:  12/6 REFERRING MD:  Dr. David Stall, CHIEF COMPLAINT:  SOB    Brief History   59 y/o M admitted 12/4 with 1 week hx of SOB, known COVID positive.  He is unvaccinated.  CTA chest negative for PE but demonstrated diffuse bilateral infiltrates.   History of present illness   59 y/o unvaccinated male, admitted 12/4 with a one week history of shortness of breath.    The patient began feeling poorly the day before Thanksgiving with fever, weakness and body aches. He tested positive for COVID on 12/27 prior to presentation.  He developed progressive shortness of breath.  In the ER, the patient was found to have saturations of 78%.  He was started on 6L O2 with initial improvement in saturations.  The patient was admitted per [facility]. CTA of the Chest was assessed and negative for pulmonary embolism but showed diffuse bilateral infiltrates. He was treated with IV remdesivir, steroids and baricitinib.  Initially, he was also treated with antibiotics but these were stopped on 12/6 with a low procalcitonin.  LE doppler negative for DVT. The patient elected to be DNR / no intubation.  He developed worsening respiratory failure with hypoxia requiring 60L flow 100% and PCCM consulted 12/6 for evaluation.   Past Medical History  Hypothyroidism  HLD   Significant Hospital Events/Procedures  12/04 Admit  12/06 PCCM consulted  12/07 Intubated, central line, a line 12/08 VV ECMO Cannulaton, cortrak  Consults:  PCCM, Heart failure, TCTS  Significant Diagnostic Tests:   CTA Chest 12/4 >> extensive bilateral airspace disease, no large PE identified, limited study   LE Venous Duplex 12/4 >> negative for DVT bilaterally   Micro Data:  COVID 12/4 >> negative  Influenza A/B 12/4 >> negative  MRSA PCR 12/5 >> negative  BCx2 12/4 >>   Antimicrobials:  Azithromycin 12/5 >> 12/6  Ceftriaxone 12/5  >> 12/6   Interim history/subjective:  Chugging overnight with coughing spells relieved with heavier sedation and volume.  Objective   Blood pressure (!) 154/62, pulse (!) 59, temperature 97.7 F (36.5 C), resp. rate 14, height 5\' 9"  (1.753 m), weight 102.6 kg, SpO2 97 %.    Vent Mode: PRVC FiO2 (%):  [40 %-70 %] 40 % Set Rate:  [12 bmp-35 bmp] 12 bmp Vt Set:  [400 mL] 400 mL PEEP:  [8 cmH20-14 cmH20] 8 cmH20 Plateau Pressure:  [21 cmH20-26 cmH20] 22 cmH20   Intake/Output Summary (Last 24 hours) at 09/07/2020 0812 Last data filed at 09/07/2020 14/05/2020 Gross per 24 hour  Intake 4493.66 ml  Output 3125 ml  Net 1368.66 ml   Filed Weights   09/03/20 1700 08/30/2020 0342 09/07/20 0300  Weight: 101.9 kg 103.6 kg 102.6 kg    Examination: Constitutional: ill appearing man on vent  Eyes: pupils pinpoint, equal, reactive to light Ears, nose, mouth, and throat: ETT in place, minimal secretions Cardiovascular: RRR, ext warm Respiratory: diminished breath sounds, passive on vent Gastrointestinal: Soft, hypoactive BS Skin: No rashes, normal turgor Neurologic: too sedated to eval Psychiatric: as above  ABG 7.34/47/112 on sweep 4.5 BMP notable for stable Cr, slightly higher Na CXR- ARDS, crescent/cortrak look fine LDH 411>>369 Fbrinogen 202>>150 Lactate 1.6>>1.4 Pct 3.4>>1.5>>0.6 PTT 42 (goal 50-70) Sugars stable on insulin gtt Vent: rate 12, PRVC TV 6cc/kg, PEEP 8, Plateau 20, DP 12  Resolved Hospital Problem list      Assessment &  Plan:   Acute hypoxemic respiratory failure secondary to COVID PNA with severe ARDS. S/p VV ECMO cannulation 09/07/20.  Completed remdesivir.  On baricitinib. Elevated Pct question superimposed CAP Bilateral lower ext DVTs- assocated with COVID infection Steroid-induced hyperglycemia Hypothyroidism, HLD- on home meds  - Work toward extubation trial and sedation wean today - Strengthen bowel regimen - Continue baricitinib, drop methylprednisolone  from 80mg  q12h to 40mg  q12h - Continue insulin gtt for now - Continue ECMO support, bival serves for VTE tx as well - Finish course of cefepime for possible CAP - Continue PTA statin, synthroid  Best practice (evaluated daily)  Diet: TF Pain/Anxiety/Delirium protocol (if indicated) wean VAP protocol (if indicated): yes DVT prophylaxis: bival GI prophylaxis: pantoprazole  Glucose control: insulin gtt Mobility: bedrest Last date of multidisciplinary goals of care discussion: wife updated over the phone 12/8 Family and staff present RN, physicain Summary of discussion con't aggressive care Follow up goals of care discussion due: 12/15 Code Status: full Disposition: ICU   Patient critically ill due to respiratory failure Interventions to address this today ventilator and ECMO adjustments Risk of deterioration without these interventions is high  I personally spent 39 minutes providing critical care not including any separately billable procedures  14/8 MD [place] [facility] 09/07/2020 8:27 AM Personal pager: [phone] If unanswered, please page CCM On-call: [phone]

## 2020-09-07 NOTE — Progress Notes (Signed)
PT Cancellation Note  Patient Details Name: Donald Rowe MRN: [id number] DOB: 1961/05/14   Cancelled Treatment:    Reason Eval/Treat Not Completed: Medical issues which prohibited therapy (Pt intubated and sedated.  Nurse asked PT to return Fri.)   Berline Lopes 09/07/2020, 9:16 AM  Atsushi Yom W,PT Acute Rehabilitation Services Pager:  [phone]  Office:  [phone]

## 2020-09-07 NOTE — Progress Notes (Signed)
Assisted tele visit to patient with family member.  Benetta Maclaren Anderson, RN   

## 2020-09-07 NOTE — Progress Notes (Signed)
Assisted tele visit to patient with daughter.  Gentle Hoge D Keijuan Schellhase, RN  

## 2020-09-07 NOTE — Plan of Care (Signed)
  Problem: Coping: Goal: Psychosocial and spiritual needs will be supported Outcome: Progressing   Problem: Respiratory: Goal: Will maintain a patent airway Outcome: Progressing Goal: Complications related to the disease process, condition or treatment will be avoided or minimized Outcome: Progressing   Problem: Respiratory: Goal: Complications related to the disease process, condition or treatment will be avoided or minimized Outcome: Progressing   

## 2020-09-07 NOTE — Progress Notes (Signed)
ANTICOAGULATION CONSULT NOTE  Pharmacy Consult for bivalirudin Indication: ECMO  No Known Allergies  Patient Measurements: Height: 5\' 9"  (175.3 cm) Weight: 102.6 kg (226 lb 3.1 oz) IBW/kg (Calculated) : 70.7  Vital Signs: Temp: 97.52 F (36.4 C) (12/09 0500) Temp Source: Axillary (12/08 2000) BP: 145/61 (12/09 0500) Pulse Rate: 64 (12/09 0500)  Labs: Recent Labs    09/05/20 0258 09/21/2020 0334 09/02/2020 0846 09/27/2020 0945 09/10/2020 1558 09/25/2020 1740 09/11/2020 1742 09/17/2020 1750 09/08/2020 2111 09/07/20 0012 09/07/20 0021 09/07/20 0438  HGB 13.5 13.8   < >  --    < >  --    < > 12.3*  --  10.6* 9.5* 10.4*  9.5*  HCT 40.8 43.9   < >  --    < >  --    < > 36.4*  --  33.0* 28.0* 31.3*  28.0*  PLT 152 173  --   --   --   --   --  143*  --  138*  --  141*  APTT  --   --    < > 27  --   --   --  181* 58*  --   --  42*  LABPROT  --   --   --  14.1  --   --   --  16.7*  --   --   --  17.3*  INR  --   --   --  1.1  --   --   --  1.4*  --   --   --  1.5*  HEPARINUNFRC  --   --   --   --   --  1.01*  --   --   --   --   --   --   CREATININE 0.96 1.25*  --   --   --   --   --  1.17  --   --   --   --    < > = values in this interval not displayed.    Estimated Creatinine Clearance: 80.3 mL/min (by C-G formula based on SCr of 1.17 mg/dL).   Assessment: 64 yom unvaccinated presenting with severe COVID ARDs, intubated on 12/6, continued to have ventilation issues despite NMB and proning - started on VV ECMO on 12/8.  Received 7500 units of heparin bolus in cath lab during cannulation. Hgb 12.6, plt 173. 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 No s/sx of bleeding. Baseline aPTT 27 earlier today.  PTT down to 42 sec (subtherapeutic) on gtt at 0.055 mg/kg/hr. No issues with line or bleeding reported per RN.  Goal of Therapy:  aPTT 60-70 seconds Monitor platelets by anticoagulation protocol: Yes   Plan:  Increase bivalirudin to 0.07 mg/kg/hr (dosing wt 103.6 kg) PTT in 4  hours  14/8, PharmD, BCPS Please see amion for complete clinical pharmacist phone list 09/07/2020 5:39 AM

## 2020-09-07 NOTE — Progress Notes (Signed)
ANTICOAGULATION CONSULT NOTE  Pharmacy Consult for bivalirudin Indication: ECMO  No Known Allergies  Patient Measurements: Height: 5\' 9"  (175.3 cm) Weight: 102.6 kg (226 lb 3.1 oz) IBW/kg (Calculated) : 70.7  Vital Signs: Temp: 98.8 F (37.1 C) (12/09 1500) Temp Source: Bladder (12/09 1200) BP: 193/64 (12/09 1501) Pulse Rate: 97 (12/09 1900)  Labs: Recent Labs    09/26/2020 0945 09/26/2020 1558 09/16/2020 1740 08/30/2020 1742 09/23/2020 1750 08/30/2020 2111 09/07/20 0012 09/07/20 0021 09/07/20 0438 09/07/20 0620 09/07/20 1002 09/07/20 1008 09/07/20 1305 09/07/20 1655 09/07/20 1713  HGB  --    < >  --    < > 12.3*  --  10.6*   < > 10.4*  9.5*   < >  --    < > 9.5* 10.5* 9.5*  HCT  --    < >  --    < > 36.4*  --  33.0*   < > 31.3*  28.0*   < >  --    < > 28.0* 32.6* 28.0*  PLT  --   --   --   --  143*  --  138*  --  141*  --   --   --   --  146*  --   APTT 27  --   --   --  181*   < >  --   --  42*  --  45*  --   --  45*  --   LABPROT 14.1  --   --   --  16.7*  --   --   --  17.3*  --   --   --   --   --   --   INR 1.1  --   --   --  1.4*  --   --   --  1.5*  --   --   --   --   --   --   HEPARINUNFRC  --   --  1.01*  --   --   --   --   --  <0.10*  --   --   --   --   --   --   CREATININE  --   --   --   --  1.17  --   --   --  1.22  --   --   --   --  0.96  --    < > = values in this interval not displayed.    Estimated Creatinine Clearance: 97.9 mL/min (by C-G formula based on SCr of 0.96 mg/dL).   Assessment: 28 yom unvaccinated presenting with severe COVID ARDs, intubated on 12/6, continued to have ventilation issues despite NMB and proning - started on VV ECMO on 12/8.  Received 7500 units of heparin bolus in cath lab during cannulation. Hgb 12.6, plt 173. 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 No s/sx of bleeding. Baseline aPTT 27  Initial aPTT elevated at 180sec right after cannulation and bivalirudin drip initiate at low drip rate.   Aptt remains low at 45  sec (subtherapeutic) on gtt at 0.09 mg/kg/hr. No issues with line or bleeding reported per RN. Head CT 12/9 pm  no bleeding. CBC stable  Goal of Therapy:  aPTT 60-70 seconds Monitor platelets by anticoagulation protocol: Yes   Plan:  Increase bivalirudin to 0.15 mg/kg/hr (dosing wt 103.6 kg) Aptt q12h while on ECMO With CBC    14/9 Pharm.D.  CPP, BCPS Clinical Pharmacist [phone] 09/07/2020 7:38 PM      [facility] pharmacy phone numbers are listed on [email or web address]

## 2020-09-07 NOTE — Procedures (Signed)
Extubation Procedure Note  Patient Details:   Name: Donald Rowe DOB: [birth year] MRN: [id number]   Airway Documentation:    Vent end date: 09/07/20 Vent end time: 1115   Evaluation  O2 sats: stable throughout Complications: No apparent complications Patient did tolerate procedure well. Bilateral Breath Sounds: Diminished   No   Patient was extubated to a NRB with RN, ECMO RN & Dr. Katrinka Blazing at the bedside.   Vernal Hritz L 09/07/2020, 11:15 AM

## 2020-09-07 NOTE — Progress Notes (Addendum)
Advanced Heart Failure Rounding Note   Subjective:    12/8 Cannulated for VV ECMO - 32 FR  RIJ Cresecent  Had severe coughing spells last night and sat up abruptly. Dropped sats to 40% became hypotensive and entrained air into circuit. Revived with albumin and sedation.  Sedated this am.    ECMO  Speed 3500 Flow 4.9L Sweep 4.5 dP 25 pVen  -95 SvO2 80%  ABG: 7.34/47/112/98% Hgb 10.4 LDH 369 Lactic acid 1.4 PTT 42    Objective:   Weight Range:  Vital Signs:   Temp:  [96.1 F (35.6 C)-97.7 F (36.5 C)] 97.7 F (36.5 C) (12/09 0700) Pulse Rate:  [56-78] 59 (12/09 0700) Resp:  [0-35] 14 (12/09 0700) BP: (99-154)/(53-78) 154/62 (12/09 0700) SpO2:  [0 %-100 %] 97 % (12/09 0700) Arterial Line BP: (103-164)/(43-72) 163/58 (12/09 0700) FiO2 (%):  [40 %-70 %] 40 % (12/09 0308) Weight:  [102.6 kg] 102.6 kg (12/09 0300) Last BM Date: 09/01/2020  Weight change: Filed Weights   09/03/20 1700 09/26/2020 0342 09/07/20 0300  Weight: 101.9 kg 103.6 kg 102.6 kg    Intake/Output:   Intake/Output Summary (Last 24 hours) at 09/07/2020 0754 Last data filed at 09/07/2020 0605 Gross per 24 hour  Intake 4565.86 ml  Output 3125 ml  Net 1440.86 ml     Physical Exam: General:  Intubated sedated HEENT: +ETT Neck: supple. RIJ ECMO. Carotids 2+ bilat; no bruits. No lymphadenopathy or thryomegaly appreciated. Cor: PMI nondisplaced. Regular rate & rhythm. No rubs, gallops or murmurs. Lungs: rhonchi Abdomen: obese soft, nontender, nondistended. No hepatosplenomegaly. No bruits or masses. Good bowel sounds. Extremities: no cyanosis, clubbing, rash, edema Neuro: sedated on vent   Telemetry: sinus 60 Personally reviewed   Labs: Basic Metabolic Panel: Recent Labs  Lab 09/03/20 0143 09/04/20 0255 09/05/20 0258 09/14/2020 0334 09/26/2020 0846 09/08/2020 1742 09/19/2020 1750 09/07/20 0021 09/07/20 0438 09/07/20 0620  NA 134* 136 137 139   < > 146* 144 145 144  145 146*  K 3.9  4.1 4.4 4.9   < > 4.3 4.4 3.9 4.3  4.3 4.1  CL 100 104 104 107  --   --  113*  --  112*  --   CO2 _0 --   --  23  --  26  --   GLUCOSE 118* 140* 171* 253*  --   --  161*  --  214*  --   BUN 15 23* 31* 41*  --   --  32*  --  40*  --   CREATININE 1.08 0.88 0.96 1.25*  --   --  1.17  --  1.22  --   CALCIUM 8.4* 8.2* 8.3* 8.4*  --   --  8.1*  --  7.9*  --   MG 1.8  --   --  2.9*  --   --   --   --  2.5*  --   PHOS 1.4*  --   --  4.3  --   --   --   --  2.0*  --    < > = values in this interval not displayed.    Liver Function Tests: Recent Labs  Lab 09/04/20 0255 09/05/20 0258 09/23/2020 0334 09/05/2020 1750 09/07/20 0438  AST 98* 71* 38 32 28  ALT 57* 51* 41 33 28  ALKPHOS 114 116 107 87 71  BILITOT 0.9 0.8 0.7 0.9 0.8  PROT 5.4* 5.2* 5.0* 4.4* 4.5*  ALBUMIN 2.7* 2.5* 2.3* 2.0* 2.6*   No results for input(s): LIPASE, AMYLASE in the last 168 hours. No results for input(s): AMMONIA in the last 168 hours.  CBC: Recent Labs  Lab 09/03/20 0143 09/04/20 0255 09/05/20 0258 09/21/2020 0334 09/13/2020 0846 09/22/2020 1750 09/07/20 0012 09/07/20 0021 09/07/20 0438 09/07/20 0620  WBC 11.6* 17.6* 17.4* 14.5*  --  6.8 5.8  --  9.0  --   NEUTROABS 9.7* 14.6* 15.0* 12.0*  --   --   --   --  7.7  --   HGB 14.8 14.2 13.5 13.8   < > 12.3* 10.6* 9.5* 10.4*  9.5* 9.2*  HCT 43.9 42.3 40.8 43.9   < > 36.4* 33.0* 28.0* 31.3*  28.0* 27.0*  MCV 91.8 91.8 92.5 97.6  --  93.1 95.4  --  94.8  --   PLT 150 150 152 173  --  143* 138*  --  141*  --    < > = values in this interval not displayed.    Cardiac Enzymes: No results for input(s): CKTOTAL, CKMB, CKMBINDEX, TROPONINI in the last 168 hours.  BNP: BNP (last 3 results) No results for input(s): BNP in the last 8760 hours.  ProBNP (last 3 results) No results for input(s): PROBNP in the last 8760 hours.    Other results:  Imaging: CARDIAC CATHETERIZATION  Result Date: 09/11/2020 See surgical note for result.  DG Chest Port 1  View in am  Result Date: 09/07/2020 CLINICAL DATA:  Intubation.  ECMO.  COVID positive. EXAM: PORTABLE CHEST 1 VIEW COMPARISON:  09/11/2020. FINDINGS: Endotracheal tube, feeding tube, left IJ line, ECMO device in stable position. Cardiomegaly. Diffuse bilateral pulmonary infiltrates/edema again noted without interim change. Low lung volumes. Small left pleural effusion again noted. No pneumothorax. IMPRESSION: 1. Lines and tubes including ECMO device in stable position. 2. Cardiomegaly with persistent bilateral pulmonary infiltrates/edema and small left pleural effusion. No interim change. Electronically Signed   By: Marcello Moores  Register   On: 09/07/2020 07:43   DG Chest Port 1 View  Result Date: 09/10/2020 CLINICAL DATA:  Central line placement EXAM: PORTABLE CHEST 1 VIEW COMPARISON:  September 05, 2020 FINDINGS: The heart size and mediastinal contours are within normal limits. Again noted are multifocal patchy airspace opacities with slight worsening within the right lung. A small left pleural effusion is seen. ETT is 1.5 cm above the carina. A left-sided central venous catheter seen with the tip at the mid SVC. NG tube is seen coursing below the diaphragm. IMPRESSION: Lines and tubes in unchanged position. Slight interval worsening in the airspace opacities throughout the right lung and stable opacities throughout the left lung. Small left pleural effusion. Electronically Signed   By: Prudencio Pair M.D.   On: 09/28/2020 19:09   DG CHEST PORT 1 VIEW  Result Date: 09/05/2020 CLINICAL DATA:  Central line placement.  COVID-19 positive. EXAM: PORTABLE CHEST 1 VIEW COMPARISON:  09/05/2020 at 0525 hours FINDINGS: Endotracheal tube remains in place, terminating 4.3 cm above the carina. Enteric tube courses into the abdomen with tip not imaged. A new left jugular catheter terminates over the mid SVC. The cardiac silhouette is mildly enlarged. Widespread interstitial and airspace opacities are again seen bilaterally.  There is overall improved lung aeration bilaterally, particularly in the left lung base. No large pleural effusion or pneumothorax is identified. IMPRESSION: 1. Interval left jugular catheter placement as above. 2. Extensive bilateral pneumonia with overall mildly improved lung aeration. Electronically Signed   By:  Logan Bores M.D.   On: 09/05/2020 11:03   DG Abd Portable 1V  Result Date: 08/31/2020 CLINICAL DATA:  Feeding tube placement EXAM: PORTABLE ABDOMEN - 1 VIEW COMPARISON:  None. FINDINGS: Enteric tube passes into the horizontal segment of the duodenum. Bowel gas pattern is unremarkable. IMPRESSION: Enteric tube within the duodenum. Electronically Signed   By: Macy Mis M.D.   On: 09/21/2020 13:56   VAS [place] LOWER EXTREMITY VENOUS (DVT)  Result Date: 09/25/2020  Lower Venous DVT Study Indications: Covid-19, pre operative ECMO.  Limitations: Ventilation and bandages. Comparison Study: Prior negative bilateral lower extremity venous duplex done                   09/16/2020 is available for comparison. Performing Technologist: Sharion Dove RVS  Examination Guidelines: A complete evaluation includes B-mode imaging, spectral Doppler, color Doppler, and power Doppler as needed of all accessible portions of each vessel. Bilateral testing is considered an integral part of a complete examination. Limited examinations for reoccurring indications may be performed as noted. The reflux portion of the exam is performed with the patient in reverse Trendelenburg.  +---------+---------------+---------+-----------+----------+------------------+ RIGHT    CompressibilityPhasicitySpontaneityPropertiesThrombus Aging     +---------+---------------+---------+-----------+----------+------------------+ CFV      Full           Yes      Yes                                     +---------+---------------+---------+-----------+----------+------------------+ SFJ      Full                                                             +---------+---------------+---------+-----------+----------+------------------+ FV Prox  Full                                                            +---------+---------------+---------+-----------+----------+------------------+ FV Mid   Full                                                            +---------+---------------+---------+-----------+----------+------------------+ FV DistalFull                                                            +---------+---------------+---------+-----------+----------+------------------+ PFV      Full                                                            +---------+---------------+---------+-----------+----------+------------------+ POP  Full           Yes      Yes                                     +---------+---------------+---------+-----------+----------+------------------+ PTV      None                                         acute, proximal                                                          calf               +---------+---------------+---------+-----------+----------+------------------+ PERO     None                                         acute, proximal                                                          calf               +---------+---------------+---------+-----------+----------+------------------+ Gastroc  None                                         Acute              +---------+---------------+---------+-----------+----------+------------------+   +---------+---------------+---------+-----------+----------+-------------------+ LEFT     CompressibilityPhasicitySpontaneityPropertiesThrombus Aging      +---------+---------------+---------+-----------+----------+-------------------+ CFV      Full           Yes      Yes                                      +---------+---------------+---------+-----------+----------+-------------------+  SFJ      Full                                                             +---------+---------------+---------+-----------+----------+-------------------+ FV Prox  Full                                                             +---------+---------------+---------+-----------+----------+-------------------+ FV Mid  Not visualized                                                            secondary to                                                              bandages            +---------+---------------+---------+-----------+----------+-------------------+ FV DistalFull                                                             +---------+---------------+---------+-----------+----------+-------------------+ PFV      Full                                                             +---------+---------------+---------+-----------+----------+-------------------+ POP      Full           Yes      Yes                                      +---------+---------------+---------+-----------+----------+-------------------+ PTV      Full                                                             +---------+---------------+---------+-----------+----------+-------------------+ PERO     None                                         acute, proximal                                                           calf                +---------+---------------+---------+-----------+----------+-------------------+ Gastroc  None                                         Acute               +---------+---------------+---------+-----------+----------+-------------------+    Summary: RIGHT: - Findings consistent with acute deep vein thrombosis involving the right posterior tibial veins, right  peroneal veins, and right gastrocnemius veins. - Findings suggest new clot progression as compared to previous examination.   LEFT: - Findings consistent with acute deep vein thrombosis involving the left peroneal veins, and left gastrocnemius veins. - Findings suggest new clot progression as compared to previous examination.  *See table(s) above for measurements and observations. Electronically signed by Servando Snare MD on 09/29/2020 at 3:12:22 PM.    Final    ECHOCARDIOGRAM LIMITED  Result Date: 09/25/2020    ECHOCARDIOGRAM LIMITED REPORT   Patient Name:   Donald Rowe Date of Exam: 09/17/2020 Medical Rec #:  [id number]      Height:       69.0 in Accession #:    [id number]     Weight:       228.4 lb Date of Birth:  1961-04-23       BSA:          2.186 m Patient Age:    48 years       BP:           122/55 mmHg Patient Gender: M              HR:           60 bpm. Exam Location:  Inpatient Procedure: Cardiac Doppler, Color Doppler and Limited Echo STAT ECHO Indications:    Cardiomyopathy  History:        Patient has no prior history of Echocardiogram examinations.                 Signs/Symptoms:Shortness of Breath and Fever; Risk                 Factors:Dyslipidemia. Covid+.  Sonographer:    Dustin Flock Referring Phys: [id number] Crocker  1. Left ventricular ejection fraction, by estimation, is 60 to 65%. The left ventricle has normal function. The left ventricle has no regional wall motion abnormalities. There is mild left ventricular hypertrophy. Left ventricular diastolic parameters are indeterminate.  2. Right ventricular systolic function is normal. The right ventricular size is normal. There is moderately elevated pulmonary artery systolic pressure.  3. The mitral valve is normal in structure. No evidence of mitral valve regurgitation.  4. The aortic valve was not well visualized. Aortic valve regurgitation is not visualized. No aortic stenosis is present. FINDINGS  Left Ventricle: Left ventricular ejection fraction, by estimation, is 60 to 65%. The left ventricle has normal function. The left ventricle has no  regional wall motion abnormalities. The left ventricular internal cavity size was normal in size. There is  mild left ventricular hypertrophy. Left ventricular diastolic parameters are indeterminate. Right Ventricle: The right ventricular size is normal. Right ventricular systolic function is normal. There is moderately elevated pulmonary artery systolic pressure. The tricuspid regurgitant velocity is 3.34 m/s, and with an assumed right atrial pressure of 10 mmHg, the estimated right ventricular systolic pressure is 94.0 mmHg. Pericardium: There is no evidence of pericardial effusion. Mitral Valve: The mitral valve is normal in structure. Tricuspid Valve: The tricuspid valve is normal in structure. Tricuspid valve regurgitation is mild. Aortic Valve: The aortic valve was not well visualized. Aortic valve regurgitation is not visualized. No aortic stenosis is present. Aorta: The aortic root is normal in size and structure. Venous: IVC assessment for right atrial pressure unable to be performed due to mechanical ventilation. IAS/Shunts: No atrial level shunt detected by color flow Doppler. LEFT VENTRICLE PLAX 2D LVIDd:  4.60 cm  Diastology LVIDs:         2.70 cm  LV e' medial:    8.38 cm/s LV PW:         1.20 cm  LV E/e' medial:  7.9 LV IVS:        1.20 cm  LV e' lateral:   7.72 cm/s LVOT diam:     1.90 cm  LV E/e' lateral: 8.6 LVOT Area:     2.84 cm  RIGHT VENTRICLE RV S prime:     3.05 cm/s LEFT ATRIUM         Index LA diam:    3.30 cm 1.51 cm/m   AORTA Ao Root diam: 2.60 cm MITRAL VALVE               TRICUSPID VALVE MV Area (PHT): 3.03 cm    TR Peak grad:   44.6 mmHg MV Decel Time: 250 msec    TR Vmax:        334.00 cm/s MV E velocity: 66.40 cm/s MV A velocity: 59.10 cm/s  SHUNTS MV E/A ratio:  1.12        Systemic Diam: 1.90 cm Oswaldo Milian MD Electronically signed by Oswaldo Milian MD Signature Date/Time: 09/09/2020/10:36:22 AM    Final       Medications:     Scheduled  Medications: . sodium chloride   Intravenous Once  . artificial tears  1 application Both Eyes S9F  . aspirin  81 mg Per Tube Daily  . baricitinib  4 mg Oral Daily  . chlorhexidine gluconate (MEDLINE KIT)  15 mL Mouth Rinse BID  . Chlorhexidine Gluconate Cloth  6 each Topical Daily  . clonazePAM  2 mg Per Tube BID  . docusate  100 mg Per Tube BID  . feeding supplement (PIVOT 1.5 CAL)  1,000 mL Per Tube Q24H  . feeding supplement (PROSource TF)  90 mL Per Tube TID  . free water  200 mL Per Tube Q4H  . levothyroxine  50 mcg Per Tube Q0600  . mouth rinse  15 mL Mouth Rinse 10 times per day  . methylPREDNISolone (SOLU-MEDROL) injection  80 mg Intravenous Q12H  . oxyCODONE  5 mg Per Tube Q6H  . pantoprazole (PROTONIX) IV  40 mg Intravenous Q24H  . polyethylene glycol  17 g Per Tube Daily  . pravastatin  40 mg Per Tube q1800  . sodium chloride flush  10-40 mL Intracatheter Q12H     Infusions: . sodium chloride 75 mL/hr at 09/07/20 0331  . albumin human 12.5 g (09/26/2020 2130)  . albumin human    . bivalirudin (ANGIOMAX) infusion 0.5 mg/mL (Non-ACS indications) 0.07 mg/kg/hr (09/07/20 0605)  . ceFEPime (MAXIPIME) IV 2 g (09/07/20 0001)  . dextrose 5 % and 0.45% NaCl Stopped (09/07/20 0130)  . fentaNYL infusion INTRAVENOUS 250 mcg/hr (09/07/20 0000)  . insulin 6 Units/hr (09/07/20 0506)  . norepinephrine (LEVOPHED) Adult infusion 1 mcg/min (09/07/20 0000)  . propofol (DIPRIVAN) infusion 25 mcg/kg/min (09/07/20 0508)     PRN Medications:  acetaminophen, albumin human, dextrose, fentaNYL, midazolam, midazolam, ondansetron **OR** ondansetron (ZOFRAN) IV, polyethylene glycol, sodium chloride flush   Assessment/Plan:   1. Acute hypoxic respiratory failure/ARDS in setting of COVID-19 PNA - Cannulated for VV ECMO on 12/8 after failing intubation x 2 days - has complete remedisivir, baricitinib - continue steroids and abx (cefepime) - Circuit looks good. CXR unchanged - having  problems with coughing fits and chugging. Given current mechanics will attempt  extubation and see if this allows [place] to wake him up more - on bival. APTT low. Goal 60-70. Discussed dosing with PharmD personally.  2. Bilateral DVT - continue bival as above  3. DM2 - on insulin gtt. continue  4. Obesity -Body mass index is 33.73 kg/m.  5. F/E/N - sodium climbing - may need FW   Plan discussed on multi-discplinary ECMO rounds with CCM, ECMO coordinator/specialist, PharmDs and RNs.  CRITICAL CARE Performed by: Glori Bickers  Total critical care time: 45 minutes  Critical care time was exclusive of separately billable procedures and treating other patients.  Critical care was necessary to treat or prevent imminent or life-threatening deterioration.  Critical care was time spent personally by me (independent of midlevel providers or residents) on the following activities: development of treatment plan with patient and/or surrogate as well as nursing, discussions with consultants, evaluation of patient's response to treatment, examination of patient, obtaining history from patient or surrogate, ordering and performing treatments and interventions, ordering and review of laboratory studies, ordering and review of radiographic studies, pulse oximetry and re-evaluation of patient's condition.    Length of Stay: 5   Glori Bickers MD 09/07/2020, 7:54 AM  Advanced Heart Failure Team Pager [phone] (M-F; [place])  Please contact [facility] for night-coverage after hours (4p -7a ) and weekends on [email or web address]

## 2020-09-07 NOTE — Procedures (Signed)
Intubation Procedure Note  Donald Rowe  [id number]  Nov 21, 1960  Date:09/07/20  Time:4:22 PM   Provider Performing:Shanty Ginty C Katrinka Blazing    Procedure: Intubation (31500)  Indication(s) Respiratory Failure  Consent Unable to obtain consent due to emergent nature of procedure.   Anesthesia Etomidate and Rocuronium   Time Out Verified patient identification, verified procedure, site/side was marked, verified correct patient position, special equipment/implants available, medications/allergies/relevant history reviewed, required imaging and test results available.   Sterile Technique Usual hand hygeine, masks, and gloves were used   Procedure Description Patient positioned in bed supine.  Sedation given as noted above.  Patient was intubated with endotracheal tube using Glidescope.  View was Grade 1 full glottis .  Number of attempts was 1.  Colorimetric CO2 detector was consistent with tracheal placement.   Complications/Tolerance None; patient tolerated the procedure well. Chest X-ray is ordered to verify placement.   EBL Minimal   Specimen(s) None

## 2020-09-07 NOTE — Progress Notes (Signed)
Patient on V-V ECMO, not enough chest wall movement to pick up rate on bedside monitor. Alarms off at this time.

## 2020-09-07 NOTE — Progress Notes (Signed)
Interim PCCM Note  Patient extubated under direct supervision, did okay for first few hours, snoring then progressed to tachycardia, hypertension, shaking of ext, not following commands, worsening hypoxemia.  Intubated for airway protection and sent to CT to r/o hemorrhage.  Will get EEG to complete workup.  Keep sedated tonight.  Wife updated.  Additional 55 minutes cc time not including any separately billable procedures.

## 2020-09-07 NOTE — Progress Notes (Signed)
Patient being transported to CT with bedside RN, RT, transporter, ECMO specialist and ECMO coordinator.  Patient removed from wall power to batter and wall O2 to tank at 6L sweep.  Emergency meds, blood and emergency supplies taken with patient.  Patient tolerated CT without incident and returned to 2H23.  Cardiohelp back to wall power and wall O2.  VSS

## 2020-09-07 NOTE — Progress Notes (Addendum)
ANTICOAGULATION CONSULT NOTE  Pharmacy Consult for bivalirudin Indication: ECMO  No Known Allergies  Patient Measurements: Height: 5\' 9"  (175.3 cm) Weight: 102.6 kg (226 lb 3.1 oz) IBW/kg (Calculated) : 70.7  Vital Signs: Temp: 97.88 F (36.6 C) (12/09 1330) Temp Source: Bladder (12/09 1200) BP: 162/63 (12/09 1300) Pulse Rate: 94 (12/09 1330)  Labs: Recent Labs    09/07/2020 0334 09/28/2020 0846 09/19/2020 0945 09/26/2020 1558 09/09/2020 1740 08/31/2020 1742 09/09/2020 1750 09/08/2020 2111 09/07/20 0012 09/07/20 0021 09/07/20 0438 09/07/20 0620 09/07/20 1002 09/07/20 1008  HGB 13.8   < >  --    < >  --    < > 12.3*  --  10.6*   < > 10.4*  9.5* 9.2*  --  8.8*  HCT 43.9   < >  --    < >  --    < > 36.4*  --  33.0*   < > 31.3*  28.0* 27.0*  --  26.0*  PLT 173  --   --   --   --   --  143*  --  138*  --  141*  --   --   --   APTT  --    < > 27  --   --   --  181* 58*  --   --  42*  --  45*  --   LABPROT  --   --  14.1  --   --   --  16.7*  --   --   --  17.3*  --   --   --   INR  --   --  1.1  --   --   --  1.4*  --   --   --  1.5*  --   --   --   HEPARINUNFRC  --   --   --   --  1.01*  --   --   --   --   --  <0.10*  --   --   --   CREATININE 1.25*  --   --   --   --   --  1.17  --   --   --  1.22  --   --   --    < > = values in this interval not displayed.    Estimated Creatinine Clearance: 77 mL/min (by C-G formula based on SCr of 1.22 mg/dL).   Assessment: 77 yom unvaccinated presenting with severe COVID ARDs, intubated on 12/6, continued to have ventilation issues despite NMB and proning - started on VV ECMO on 12/8.  Received 7500 units of heparin bolus in cath lab during cannulation. Hgb 12.6, plt 173. 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 No s/sx of bleeding. Baseline aPTT 27 earlier today.  PTT low at 45 sec (subtherapeutic) on gtt at 0.07 mg/kg/hr. No issues with line or bleeding reported per RN.  Goal of Therapy:  aPTT 60-70 seconds Monitor platelets by  anticoagulation protocol: Yes   Plan:  Increase bivalirudin to 0.09 mg/kg/hr (dosing wt 103.6 kg) PTT in 3 hours (with 5 pm labs)  14/8, Reece Leader, [facility] Clinical Pharmacist  09/07/2020 2:20 PM   [facility] pharmacy phone numbers are listed on [email or web address]

## 2020-09-07 NOTE — Progress Notes (Addendum)
Nutrition Follow Up  DOCUMENTATION CODES:   Not applicable  INTERVENTION:   Noted no BM in 5 days, sorbitol added   Tube feeding:  -Pivot 1.5 @ 50 ml/hr via Cortrak (1200 ml) -ProSource TF 45 ml QID -Free water flushes 200 ml Q4 hours   Provides: 1960 kcal (2285 kcal with propofol), 157 grams protein, 900 ml free water (2100 ml with flushes). Kcal exceed requirement to meet protein needs.   NUTRITION DIAGNOSIS:   Increased nutrient needs related to acute illness (COVID PNA) as evidenced by estimated needs   Ongoing  GOAL:   Patient will meet greater than or equal to 90% of their needs   Addressed via TF  MONITOR:   Vent status,Skin,Weight trends,Labs,I & O's,TF tolerance  REASON FOR ASSESSMENT:   Ventilator,Consult Enteral/tube feeding initiation and management  ASSESSMENT:   Patient with PMH significant for asthma, cancer, CHF, DM, HTN, sickle cell anemia, renal insufficiency, and hyperlipidemia. Presents this admission with severe COVID ARDS.   12/4- admit [facility] 12/6- hypoxia, intubated 12/7- transfer to [facility] for ECMO 12/8- VV ECMO cannulation, post pyloric Cortrak placed  Extubated this am. CXR unchanged. Remains on low dose propofol. Pivot 1.5 started at 40 ml/hr, tolerating well. Still awaiting BM. Slight drop in phosphorus, recommend repletion. Continue to monitor signs for refeeding.   Remains on insulin drip. CBGs improved (181, 149, 161, 178, 214, and 172).   Admission weight: 101.9 kg  Current weight: 102.6 kg   UOP: 3125 ml x 24 hrs   Propofol: 12.3 ml/hr- provides 325 kcal from lipids daily   Drips: NS @ 75 ml/hr, insulin, propofol Medications: colace, solumedrol, miralax, sorbitol Labs: Phosphorus 2.0 (L) Mg 2.5 (H)   Diet Order:   Diet Order            Diet NPO time specified  Diet effective now                 EDUCATION NEEDS:   Not appropriate for education at this time  Skin:  Skin Assessment: Reviewed RN Assessment  Last BM:   12/4  Height:   Ht Readings from Last 1 Encounters:  09/05/20 5\' 9"  (1.753 m)    Weight:   Wt Readings from Last 1 Encounters:  09/07/20 102.6 kg    Adjusted Body Weight:  84.5 kg   BMI:  Body mass index is 33.4 kg/m.  Estimated Nutritional Needs:   Kcal:  1600-1900 kcal  Protein:  145-180 grams  Fluid:  >/= 1.9 L/day  14/09/21 RD, LDN Clinical Nutrition Pager listed in AMION

## 2020-09-07 NOTE — Procedures (Addendum)
Patient Name: Donald Rowe  MRN: [id number]  Epilepsy Attending: Charlsie Quest  Referring Physician/Provider: Dr Levon Hedger Date: 09/07/2020 Duration: 24.54 mins  Patient history: 59yo m with COVID, ams. EEG to evaluate for seizure.  Level of alertness: comatose  AEDs during EEG study: Propofol ( turned off during eeg)  Technical aspects: This EEG study was done with scalp electrodes positioned according to the 10-20 International system of electrode placement. Electrical activity was acquired at a sampling rate of 500Hz  and reviewed with a high frequency filter of 70Hz  and a low frequency filter of 1Hz . EEG data were recorded continuously and digitally stored.   Description: EEG showed discontinuous pattern with  generalized polymorphic 3 to 6 Hz theta-delta slowing as well as brief periods of generalized attenuation lasting 2-4 seconds. Hyperventilation and photic stimulation were not performed.     ABNORMALITY -Continuous slow, generalized  IMPRESSION: This study is suggestive of severe diffuse encephalopathy, nonspecific etiology but likely secondary to sedation. No seizures or definite epileptiform discharges were seen throughout the recording.  Javyn Havlin 

## 2020-09-07 NOTE — Progress Notes (Signed)
Remaining 36mL of fentanyl infusion wasted in stericycle with Nelva Bush, RN.

## 2020-09-07 NOTE — Progress Notes (Signed)
OT Cancellation Note  Patient Details Name: Donald Rowe MRN: [id number] DOB: September 09, 1961   Cancelled Treatment:    Reason Eval/Treat Not Completed: Patient not medically ready.  Sedated, nursing advised to trial tomorrow.  Continue efforts as appropriate.    Alin Chavira D Kathe Wirick 09/07/2020, 11:25 AM

## 2020-09-07 NOTE — Progress Notes (Signed)
STAT EEG complete - results pending. ? ?

## 2020-09-07 NOTE — Plan of Care (Signed)
  Problem: Respiratory: Goal: Will maintain a patent airway Outcome: Progressing   Problem: Fluid Volume: Goal: Hemodynamic stability will improve Outcome: Progressing   Problem: Clinical Measurements: Goal: Diagnostic test results will improve Outcome: Progressing   Problem: Clinical Measurements: Goal: Will remain free from infection Outcome: Progressing Goal: Cardiovascular complication will be avoided Outcome: Progressing   Problem: Nutrition: Goal: Adequate nutrition will be maintained Outcome: Progressing   Problem: Elimination: Goal: Will not experience complications related to bowel motility Outcome: Progressing Goal: Will not experience complications related to urinary retention Outcome: Progressing

## 2020-09-07 NOTE — Procedures (Signed)
Extracorporeal support note   ECLS support day: 1 Indication: COVD ARDS  Configuration: VV, RIJ 32Fr [place]  Pump speed: 3500 Pump flow: 4.9 Pump used: Cardiohelp  Sweep gas: 100%, 4.5LPM  Circuit check: no clots Anticoagulant: bivalirudin Anticoagulation targets: PTT 50-70  Changes in support: trial of extubation  Anticipated goals/duration of support: bridge to recovery  Myrla Halsted MD PCCM

## 2020-09-07 NOTE — Progress Notes (Signed)
Patient began to have tremor like activity and rigidness, HR 140s, SBP 200's Sats 77%.  I covered pt in warm blanked and gave bolus of fentanyl.  This did not stop the tremors; they became worse with stimulation.  Notified Dr. Katrinka Blazing and order given for 2mg  ativan IV without relief.  Dr. to bedside.  Decision made to reintubate and and go for STAT head CT.  Family updated by Dr. Katrinka Blazing.

## 2020-09-08 ENCOUNTER — Inpatient Hospital Stay (HOSPITAL_COMMUNITY): Payer: HRSA Program

## 2020-09-08 DIAGNOSIS — Z789 Other specified health status: Secondary | ICD-10-CM

## 2020-09-08 DIAGNOSIS — Z515 Encounter for palliative care: Secondary | ICD-10-CM | POA: Diagnosis not present

## 2020-09-08 DIAGNOSIS — J9601 Acute respiratory failure with hypoxia: Secondary | ICD-10-CM | POA: Diagnosis not present

## 2020-09-08 DIAGNOSIS — Z7189 Other specified counseling: Secondary | ICD-10-CM | POA: Diagnosis not present

## 2020-09-08 DIAGNOSIS — U071 COVID-19: Secondary | ICD-10-CM | POA: Diagnosis not present

## 2020-09-08 LAB — BASIC METABOLIC PANEL
Anion gap: 7 (ref 5–15)
Anion gap: 9 (ref 5–15)
BUN: 40 mg/dL — ABNORMAL HIGH (ref 6–20)
BUN: 40 mg/dL — ABNORMAL HIGH (ref 6–20)
CO2: 23 mmol/L (ref 22–32)
CO2: 22 mmol/L (ref 22–32)
Calcium: 7.5 mg/dL — ABNORMAL LOW (ref 8.9–10.3)
Calcium: 7.8 mg/dL — ABNORMAL LOW (ref 8.9–10.3)
Chloride: 116 mmol/L — ABNORMAL HIGH (ref 98–111)
Chloride: 117 mmol/L — ABNORMAL HIGH (ref 98–111)
Creatinine, Ser: 1.04 mg/dL (ref 0.61–1.24)
Creatinine, Ser: 0.97 mg/dL (ref 0.61–1.24)
GFR, Estimated: >60 mL/min (ref >60)
GFR, Estimated: >60 mL/min (ref >60)
Glucose, Bld: 231 mg/dL — ABNORMAL HIGH (ref 70–99)
Glucose, Bld: 139 mg/dL — ABNORMAL HIGH (ref 70–99)
Potassium: 4.4 mmol/L (ref 3.5–5.1)
Potassium: 3.9 mmol/L (ref 3.5–5.1)
Sodium: 146 mmol/L — ABNORMAL HIGH (ref 135–145)
Sodium: 148 mmol/L — ABNORMAL HIGH (ref 135–145)

## 2020-09-08 LAB — POCT I-STAT 7, (LYTES, BLD GAS, ICA,H+H)
Acid-base deficit: 1 mmol/L (ref 0.0–2.0)
Acid-base deficit: 2 mmol/L (ref 0.0–2.0)
Acid-base deficit: 1 mmol/L (ref 0.0–2.0)
Acid-base deficit: 1 mmol/L (ref 0.0–2.0)
Acid-base deficit: 2 mmol/L (ref 0.0–2.0)
Bicarbonate: 24.2 mmol/L (ref 20.0–28.0)
Bicarbonate: 23.2 mmol/L (ref 20.0–28.0)
Bicarbonate: 23.9 mmol/L (ref 20.0–28.0)
Bicarbonate: 22.7 mmol/L (ref 20.0–28.0)
Bicarbonate: 22.3 mmol/L (ref 20.0–28.0)
Calcium, Ion: 1.24 mmol/L (ref 1.15–1.40)
Calcium, Ion: 1.29 mmol/L (ref 1.15–1.40)
Calcium, Ion: 1.28 mmol/L (ref 1.15–1.40)
Calcium, Ion: 1.23 mmol/L (ref 1.15–1.40)
Calcium, Ion: 1.25 mmol/L (ref 1.15–1.40)
HCT: 26 % — ABNORMAL LOW (ref 39.0–52.0)
HCT: 26 % — ABNORMAL LOW (ref 39.0–52.0)
HCT: 27 % — ABNORMAL LOW (ref 39.0–52.0)
HCT: 29 % — ABNORMAL LOW (ref 39.0–52.0)
HCT: 26 % — ABNORMAL LOW (ref 39.0–52.0)
Hemoglobin: 8.8 g/dL — ABNORMAL LOW (ref 13.0–17.0)
Hemoglobin: 8.8 g/dL — ABNORMAL LOW (ref 13.0–17.0)
Hemoglobin: 9.2 g/dL — ABNORMAL LOW (ref 13.0–17.0)
Hemoglobin: 9.9 g/dL — ABNORMAL LOW (ref 13.0–17.0)
Hemoglobin: 8.8 g/dL — ABNORMAL LOW (ref 13.0–17.0)
O2 Saturation: 90 %
O2 Saturation: 92 %
O2 Saturation: 88 %
O2 Saturation: 91 %
O2 Saturation: 91 %
Patient temperature: 37.5
Patient temperature: 37
Patient temperature: 37.8
Patient temperature: 38
Patient temperature: 37.7
Potassium: 4.3 mmol/L (ref 3.5–5.1)
Potassium: 3.8 mmol/L (ref 3.5–5.1)
Potassium: 4.1 mmol/L (ref 3.5–5.1)
Potassium: 3.9 mmol/L (ref 3.5–5.1)
Potassium: 3.7 mmol/L (ref 3.5–5.1)
Sodium: 149 mmol/L — ABNORMAL HIGH (ref 135–145)
Sodium: 152 mmol/L — ABNORMAL HIGH (ref 135–145)
Sodium: 151 mmol/L — ABNORMAL HIGH (ref 135–145)
Sodium: 151 mmol/L — ABNORMAL HIGH (ref 135–145)
Sodium: 151 mmol/L — ABNORMAL HIGH (ref 135–145)
TCO2: 25 mmol/L (ref 22–32)
TCO2: 24 mmol/L (ref 22–32)
TCO2: 25 mmol/L (ref 22–32)
TCO2: 24 mmol/L (ref 22–32)
TCO2: 23 mmol/L (ref 22–32)
pCO2 arterial: 42 mmHg (ref 32.0–48.0)
pCO2 arterial: 38.4 mmHg (ref 32.0–48.0)
pCO2 arterial: 39.3 mmHg (ref 32.0–48.0)
pCO2 arterial: 36 mmHg (ref 32.0–48.0)
pCO2 arterial: 34.8 mmHg (ref 32.0–48.0)
pH, Arterial: 7.37 (ref 7.350–7.450)
pH, Arterial: 7.389 (ref 7.350–7.450)
pH, Arterial: 7.395 (ref 7.350–7.450)
pH, Arterial: 7.412 (ref 7.350–7.450)
pH, Arterial: 7.417 (ref 7.350–7.450)
pO2, Arterial: 63 mmHg — ABNORMAL LOW (ref 83.0–108.0)
pO2, Arterial: 66 mmHg — ABNORMAL LOW (ref 83.0–108.0)
pO2, Arterial: 57 mmHg — ABNORMAL LOW (ref 83.0–108.0)
pO2, Arterial: 64 mmHg — ABNORMAL LOW (ref 83.0–108.0)
pO2, Arterial: 61 mmHg — ABNORMAL LOW (ref 83.0–108.0)

## 2020-09-08 LAB — CBC
HCT: 30.5 % — ABNORMAL LOW (ref 39.0–52.0)
HCT: 30.5 % — ABNORMAL LOW (ref 39.0–52.0)
Hemoglobin: 9.7 g/dL — ABNORMAL LOW (ref 13.0–17.0)
Hemoglobin: 10.3 g/dL — ABNORMAL LOW (ref 13.0–17.0)
MCH: 30.4 pg (ref 26.0–34.0)
MCH: 31.7 pg (ref 26.0–34.0)
MCHC: 31.8 g/dL (ref 30.0–36.0)
MCHC: 33.8 g/dL (ref 30.0–36.0)
MCV: 95.6 fL (ref 80.0–100.0)
MCV: 93.8 fL (ref 80.0–100.0)
Platelets: 144 10*3/uL — ABNORMAL LOW (ref 150–400)
Platelets: 149 10*3/uL — ABNORMAL LOW (ref 150–400)
RBC: 3.19 MIL/uL — ABNORMAL LOW (ref 4.22–5.81)
RBC: 3.25 MIL/uL — ABNORMAL LOW (ref 4.22–5.81)
RDW: 13.6 % (ref 11.5–15.5)
RDW: 13.7 % (ref 11.5–15.5)
WBC: 11 10*3/uL — ABNORMAL HIGH (ref 4.0–10.5)
WBC: 11.3 10*3/uL — ABNORMAL HIGH (ref 4.0–10.5)
nRBC: 0 % (ref 0.0–0.2)
nRBC: 0 % (ref 0.0–0.2)

## 2020-09-08 LAB — GLUCOSE, CAPILLARY
Glucose-Capillary: 132 mg/dL — ABNORMAL HIGH (ref 70–99)
Glucose-Capillary: 133 mg/dL — ABNORMAL HIGH (ref 70–99)
Glucose-Capillary: 140 mg/dL — ABNORMAL HIGH (ref 70–99)
Glucose-Capillary: 155 mg/dL — ABNORMAL HIGH (ref 70–99)
Glucose-Capillary: 173 mg/dL — ABNORMAL HIGH (ref 70–99)
Glucose-Capillary: 175 mg/dL — ABNORMAL HIGH (ref 70–99)
Glucose-Capillary: 180 mg/dL — ABNORMAL HIGH (ref 70–99)
Glucose-Capillary: 193 mg/dL — ABNORMAL HIGH (ref 70–99)
Glucose-Capillary: 198 mg/dL — ABNORMAL HIGH (ref 70–99)
Glucose-Capillary: 200 mg/dL — ABNORMAL HIGH (ref 70–99)
Glucose-Capillary: 204 mg/dL — ABNORMAL HIGH (ref 70–99)
Glucose-Capillary: 208 mg/dL — ABNORMAL HIGH (ref 70–99)
Glucose-Capillary: 211 mg/dL — ABNORMAL HIGH (ref 70–99)

## 2020-09-08 LAB — APTT
aPTT: 52 seconds — ABNORMAL HIGH (ref 24–36)
aPTT: 52 seconds — ABNORMAL HIGH (ref 24–36)
aPTT: 52 seconds — ABNORMAL HIGH (ref 24–36)
aPTT: 61 seconds — ABNORMAL HIGH (ref 24–36)

## 2020-09-08 LAB — MAGNESIUM: Magnesium: 2.8 mg/dL — ABNORMAL HIGH (ref 1.7–2.4)

## 2020-09-08 LAB — TSH: TSH: 0.754 u[IU]/mL (ref 0.350–4.500)

## 2020-09-08 LAB — PROTIME-INR
INR: 1.8 — ABNORMAL HIGH (ref 0.8–1.2)
Prothrombin Time: 20.5 seconds — ABNORMAL HIGH (ref 11.4–15.2)

## 2020-09-08 LAB — HEPATIC FUNCTION PANEL
ALT: 34 U/L (ref 0–44)
AST: 42 U/L — ABNORMAL HIGH (ref 15–41)
Albumin: 2.3 g/dL — ABNORMAL LOW (ref 3.5–5.0)
Alkaline Phosphatase: 67 U/L (ref 38–126)
Bilirubin, Direct: 0.4 mg/dL — ABNORMAL HIGH (ref 0.0–0.2)
Indirect Bilirubin: 0.8 mg/dL (ref 0.3–0.9)
Total Bilirubin: 1.2 mg/dL (ref 0.3–1.2)
Total Protein: 4.3 g/dL — ABNORMAL LOW (ref 6.5–8.1)

## 2020-09-08 LAB — LACTATE DEHYDROGENASE: LDH: 447 U/L — ABNORMAL HIGH (ref 98–192)

## 2020-09-08 LAB — LACTIC ACID, PLASMA
Lactic Acid, Venous: 1.8 mmol/L (ref 0.5–1.9)
Lactic Acid, Venous: 2.1 mmol/L (ref 0.5–1.9)

## 2020-09-08 LAB — PHOSPHORUS: Phosphorus: 1.8 mg/dL — ABNORMAL LOW (ref 2.5–4.6)

## 2020-09-08 LAB — FIBRINOGEN: Fibrinogen: 187 mg/dL — ABNORMAL LOW (ref 210–475)

## 2020-09-08 MED ORDER — INSULIN ASPART 100 UNIT/ML ~~LOC~~ SOLN
2.0000 [IU] | SUBCUTANEOUS | Status: DC
  Administered 2020-09-08 (×3): 2 [IU] via SUBCUTANEOUS
  Administered 2020-09-08: 4 [IU] via SUBCUTANEOUS
  Administered 2020-09-09: 13:00:00 6 [IU] via SUBCUTANEOUS
  Administered 2020-09-09: 17:00:00 4 [IU] via SUBCUTANEOUS
  Administered 2020-09-09: 05:00:00 6 [IU] via SUBCUTANEOUS
  Administered 2020-09-09 (×3): 4 [IU] via SUBCUTANEOUS
  Administered 2020-09-10 (×4): 2 [IU] via SUBCUTANEOUS
  Administered 2020-09-11: 12:00:00 4 [IU] via SUBCUTANEOUS
  Administered 2020-09-11: 08:00:00 2 [IU] via SUBCUTANEOUS
  Administered 2020-09-11: 4 [IU] via SUBCUTANEOUS
  Administered 2020-09-11 (×2): 2 [IU] via SUBCUTANEOUS
  Administered 2020-09-12 (×3): 4 [IU] via SUBCUTANEOUS
  Administered 2020-09-12: 04:00:00 2 [IU] via SUBCUTANEOUS
  Administered 2020-09-12: 16:00:00 4 [IU] via SUBCUTANEOUS
  Administered 2020-09-13: 15:00:00 6 [IU] via SUBCUTANEOUS
  Administered 2020-09-13 (×2): 4 [IU] via SUBCUTANEOUS
  Administered 2020-09-13: 20:00:00 6 [IU] via SUBCUTANEOUS
  Administered 2020-09-13: 01:00:00 4 [IU] via SUBCUTANEOUS
  Administered 2020-09-13 – 2020-09-14 (×2): 6 [IU] via SUBCUTANEOUS
  Administered 2020-09-14: 15:00:00 2 [IU] via SUBCUTANEOUS
  Administered 2020-09-14 – 2020-09-18 (×23): 4 [IU] via SUBCUTANEOUS
  Administered 2020-09-18 (×2): 2 [IU] via SUBCUTANEOUS
  Administered 2020-09-18 (×2): 4 [IU] via SUBCUTANEOUS
  Administered 2020-09-19 (×3): 2 [IU] via SUBCUTANEOUS
  Administered 2020-09-19 – 2020-09-20 (×2): 4 [IU] via SUBCUTANEOUS
  Administered 2020-09-20: 04:00:00 2 [IU] via SUBCUTANEOUS
  Administered 2020-09-21: 04:00:00 4 [IU] via SUBCUTANEOUS
  Administered 2020-09-21: 20:00:00 2 [IU] via SUBCUTANEOUS
  Administered 2020-09-22: 16:00:00 4 [IU] via SUBCUTANEOUS
  Administered 2020-09-22 (×2): 2 [IU] via SUBCUTANEOUS
  Administered 2020-09-22: 12:00:00 4 [IU] via SUBCUTANEOUS
  Administered 2020-09-23 (×4): 2 [IU] via SUBCUTANEOUS
  Administered 2020-09-24: 16:00:00 4 [IU] via SUBCUTANEOUS
  Administered 2020-09-24 (×3): 2 [IU] via SUBCUTANEOUS

## 2020-09-08 MED ORDER — METOPROLOL TARTRATE 25 MG/10 ML ORAL SUSPENSION
50.0000 mg | Freq: Two times a day (BID) | ORAL | Status: DC
  Administered 2020-09-08: 50 mg
  Filled 2020-09-08: qty 20

## 2020-09-08 MED ORDER — POTASSIUM & SODIUM PHOSPHATES 280-160-250 MG PO PACK
1.0000 | PACK | Freq: Four times a day (QID) | ORAL | Status: AC
  Administered 2020-09-08 – 2020-09-09 (×3): 1
  Filled 2020-09-08 (×3): qty 1

## 2020-09-08 MED ORDER — BARICITINIB 2 MG PO TABS
4.0000 mg | ORAL_TABLET | Freq: Every day | ORAL | Status: AC
  Administered 2020-09-08 – 2020-09-11 (×4): 4 mg
  Filled 2020-09-08 (×4): qty 2

## 2020-09-08 MED ORDER — CLEVIDIPINE BUTYRATE 0.5 MG/ML IV EMUL
0.0000 mg/h | INTRAVENOUS | Status: DC
  Administered 2020-09-08: 3 mg/h via INTRAVENOUS
  Administered 2020-09-08: 1 mg/h via INTRAVENOUS
  Administered 2020-09-08: 3 mg/h via INTRAVENOUS
  Administered 2020-09-09: 12:00:00 6 mg/h via INTRAVENOUS
  Administered 2020-09-09: 17:00:00 4 mg/h via INTRAVENOUS
  Administered 2020-09-09: 04:00:00 2 mg/h via INTRAVENOUS
  Filled 2020-09-08: qty 100
  Filled 2020-09-08 (×4): qty 50
  Filled 2020-09-08 (×3): qty 100

## 2020-09-08 MED ORDER — LABETALOL HCL 5 MG/ML IV SOLN
10.0000 mg | INTRAVENOUS | Status: DC | PRN
  Administered 2020-09-08 – 2020-10-10 (×35): 10 mg via INTRAVENOUS
  Filled 2020-09-08 (×27): qty 4

## 2020-09-08 MED ORDER — METOPROLOL TARTRATE 25 MG/10 ML ORAL SUSPENSION
25.0000 mg | Freq: Two times a day (BID) | ORAL | Status: DC
  Administered 2020-09-08: 25 mg
  Filled 2020-09-08: qty 10

## 2020-09-08 MED ORDER — ACETAMINOPHEN 160 MG/5ML PO SOLN
650.0000 mg | Freq: Four times a day (QID) | ORAL | Status: DC | PRN
  Administered 2020-09-08: 650 mg
  Filled 2020-09-08: qty 20.3

## 2020-09-08 MED ORDER — INSULIN DETEMIR 100 UNIT/ML ~~LOC~~ SOLN
24.0000 [IU] | Freq: Two times a day (BID) | SUBCUTANEOUS | Status: DC
  Administered 2020-09-08 – 2020-09-16 (×16): 24 [IU] via SUBCUTANEOUS
  Filled 2020-09-08 (×18): qty 0.24

## 2020-09-08 MED ORDER — INSULIN ASPART 100 UNIT/ML ~~LOC~~ SOLN
8.0000 [IU] | SUBCUTANEOUS | Status: DC
  Administered 2020-09-08 – 2020-09-15 (×38): 8 [IU] via SUBCUTANEOUS

## 2020-09-08 MED ORDER — QUETIAPINE FUMARATE 50 MG PO TABS
50.0000 mg | ORAL_TABLET | Freq: Two times a day (BID) | ORAL | Status: DC
  Administered 2020-09-08 – 2020-09-09 (×2): 50 mg
  Filled 2020-09-08 (×2): qty 1

## 2020-09-08 MED ORDER — FREE WATER
300.0000 mL | Status: DC
  Administered 2020-09-08 – 2020-09-11 (×18): 300 mL

## 2020-09-08 MED ORDER — LABETALOL HCL 5 MG/ML IV SOLN
INTRAVENOUS | Status: AC
  Administered 2020-09-08: 10 mg via INTRAVENOUS
  Filled 2020-09-08: qty 4

## 2020-09-08 MED ORDER — PIVOT 1.5 CAL PO LIQD
1000.0000 mL | ORAL | Status: DC
  Administered 2020-09-08 – 2020-09-11 (×6): 1000 mL

## 2020-09-08 MED ORDER — PROSOURCE TF PO LIQD
45.0000 mL | Freq: Two times a day (BID) | ORAL | Status: DC
  Administered 2020-09-08 – 2020-09-11 (×6): 45 mL
  Filled 2020-09-08 (×6): qty 45

## 2020-09-08 MED ORDER — IBUPROFEN 100 MG/5ML PO SUSP
400.0000 mg | Freq: Four times a day (QID) | ORAL | Status: DC | PRN
  Administered 2020-09-08: 400 mg
  Filled 2020-09-08 (×2): qty 20

## 2020-09-08 MED ORDER — PANTOPRAZOLE SODIUM 40 MG PO PACK
40.0000 mg | PACK | Freq: Every day | ORAL | Status: DC
  Administered 2020-09-08 – 2020-09-18 (×11): 40 mg
  Filled 2020-09-08 (×11): qty 20

## 2020-09-08 MED ORDER — QUETIAPINE FUMARATE 25 MG PO TABS
25.0000 mg | ORAL_TABLET | Freq: Two times a day (BID) | ORAL | Status: DC
  Administered 2020-09-08: 25 mg via ORAL
  Filled 2020-09-08: qty 1

## 2020-09-08 MED ORDER — DEXTROSE 10 % IV SOLN
INTRAVENOUS | Status: DC | PRN
Start: 2020-09-08 — End: 2020-09-25

## 2020-09-08 NOTE — Progress Notes (Signed)
ANTICOAGULATION CONSULT NOTE  Pharmacy Consult for bivalirudin Indication: ECMO  No Known Allergies  Patient Measurements: Height: 5\' 9"  (175.3 cm) Weight: 109.9 kg (242 lb 4.6 oz) IBW/kg (Calculated) : 70.7  Vital Signs: Temp: 98.42 F (36.9 C) (12/10 2200) Temp Source: Bladder (12/10 2000) BP: 155/47 (12/10 2052) Pulse Rate: 93 (12/10 2200)  Labs: Recent Labs    09/29/2020 1740 09/10/2020 1742 09/05/2020 1750 08/31/2020 2111 09/07/20 0438 09/07/20 0620 09/07/20 1655 09/07/20 1713 09/08/20 0343 09/08/20 0351 09/08/20 0953 09/08/20 1230 09/08/20 1639 09/08/20 1651 09/08/20 1954 09/08/20 2307  HGB  --    < > 12.3*   < > 10.4*  9.5*   < > 10.5*   < > 9.7*   < >  --    < > 10.3* 9.9* 8.8*  --   HCT  --    < > 36.4*   < > 31.3*  28.0*   < > 32.6*   < > 30.5*   < >  --    < > 30.5* 29.0* 26.0*  --   PLT  --   --  143*   < > 141*  --  146*  --  144*  --   --   --  149*  --   --   --   APTT  --   --  181*   < > 42*   < > 45*  --  52*  --  52*  --  52*  --   --  61*  LABPROT  --   --  16.7*  --  17.3*  --   --   --  20.5*  --   --   --   --   --   --   --   INR  --   --  1.4*  --  1.5*  --   --   --  1.8*  --   --   --   --   --   --   --   HEPARINUNFRC 1.01*  --   --   --  <0.10*  --   --   --   --   --   --   --   --   --   --   --   CREATININE  --   --  1.17  --  1.22  --  0.96  --  1.04  --   --   --  0.97  --   --   --    < > = values in this interval not displayed.    Estimated Creatinine Clearance: 100.2 mL/min (by C-G formula based on SCr of 0.97 mg/dL).   Assessment: 56 yom unvaccinated presenting with severe COVID ARDs, intubated on 12/6, continued to have ventilation issues despite NMB and proning - started on VV ECMO on 12/8.  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 No s/sx of bleeding. Baseline aPTT 27.  APTT 61 sec (therapeutic) on bivalirudin@0 .24 mg/kg/hr. Hgb 8.8, PLT 149. No s/sx of bleeding or infusion issues - no issue with circuit.   Goal of  Therapy:  aPTT 60-70 seconds Monitor platelets by anticoagulation protocol: Yes   Plan:  Continue bivalirudin at 0.24 mg/kg/hr (dosing wt 103.6 kg) F/u daily PTT  14/8, PharmD, BCPS Please see amion for complete clinical pharmacist phone list 09/08/2020 11:44 PM

## 2020-09-08 NOTE — Progress Notes (Signed)
NAME:  Donald Rowe, MRN:  [id number], DOB:  [birth year], LOS: 6 ADMISSION DATE:  09/10/2020, CONSULTATION DATE:  12/6 REFERRING MD:  Dr. David Stall, CHIEF COMPLAINT:  SOB    Brief History   59 y/o M admitted 12/4 with 1 week hx of SOB, known COVID positive.  He is unvaccinated.  CTA chest negative for PE but demonstrated diffuse bilateral infiltrates.   History of present illness   59 y/o unvaccinated male, admitted 12/4 with a one week history of shortness of breath.    The patient began feeling poorly the day before Thanksgiving with fever, weakness and body aches. He tested positive for COVID on 12/27 prior to presentation.  He developed progressive shortness of breath.  In the ER, the patient was found to have saturations of 78%.  He was started on 6L O2 with initial improvement in saturations.  The patient was admitted per [facility]. CTA of the Chest was assessed and negative for pulmonary embolism but showed diffuse bilateral infiltrates. He was treated with IV remdesivir, steroids and baricitinib.  Initially, he was also treated with antibiotics but these were stopped on 12/6 with a low procalcitonin.  LE doppler negative for DVT. The patient elected to be DNR / no intubation.  He developed worsening respiratory failure with hypoxia requiring 60L flow 100% and PCCM consulted 12/6 for evaluation.   Past Medical History  Hypothyroidism  HLD   Significant Hospital Events/Procedures  12/04 Admit  12/06 PCCM consulted  12/07 Intubated, central line, a line 12/08 VV ECMO Cannulaton, cortrak  Consults:  PCCM, Heart failure, TCTS  Significant Diagnostic Tests:   CTA Chest 12/4 >> extensive bilateral airspace disease, no large PE identified, limited study   LE Venous Duplex 12/4 >> negative for DVT bilaterally   Micro Data:  COVID 12/4 >> negative  Influenza A/B 12/4 >> negative  MRSA PCR 12/5 >> negative  BCx2 12/4 >>   Antimicrobials:  Azithromycin 12/5 >> 12/6  Ceftriaxone 12/5  >> 12/6   Interim history/subjective:  Failed attempt at extubation yesterday due to poor mental status.  Reintubated.  CT head showed no evidence of intracranial abnormalities.  EEG negative. Slow to wake up off propofol today.  Objective   Blood pressure (!) 178/63, pulse 99, temperature 99.14 F (37.3 C), resp. rate 20, height 5\' 9"  (1.753 m), weight 109.9 kg, SpO2 92 %.    Vent Mode: PCV FiO2 (%):  [40 %-100 %] 40 % Set Rate:  [12 bmp] 12 bmp Vt Set:  [400 mL] 400 mL PEEP:  [8 cmH20-10 cmH20] 10 cmH20 Pressure Support:  [12 cmH20] 12 cmH20 Plateau Pressure:  [20 cmH20-24 cmH20] 21 cmH20   Intake/Output Summary (Last 24 hours) at 09/08/2020 1014 Last data filed at 09/08/2020 0900 Gross per 24 hour  Intake 5076.51 ml  Output 2600 ml  Net 2476.51 ml   Filed Weights   09/25/20 0342 09/07/20 0300 09/08/20 0408  Weight: 103.6 kg 102.6 kg 109.9 kg    Examination: Constitutional: ill appearing man on vent  Eyes: pupils pinpoint, equal, reactive to light Ears, nose, mouth, and throat: ETT in place, minimal secretions Cardiovascular: RRR, ext warm heart sounds unremarkable. Respiratory: Crackles and bronchial breath sounds at bases bilaterally Gastrointestinal: Soft active bowel sounds rectal tube in place. Skin: No rashes, line sites intact Neurologic: No response to painful stimuli.  Resolved Hospital Problem list      Assessment & Plan:   Critically ill due to acute hypoxemic and hypercarbic respiratory failure secondary  to COVID PNA with severe ARDS. S/p VV ECMO cannulation 09/07/20.  Completed remdesivir.  On baricitinib. Elevated Pct question superimposed CAP Bilateral lower ext DVTs- assocated with COVID infection Steroid-induced hyperglycemia Hypothyroidism, HLD- on home meds  -Slow sedation wean we will reattempt extubation once awake -Continue ultra lung protective ventilation stress - Continue baricitinib, drop methylprednisolone from 80mg  q12h to 40mg  q12h  for 5 days -Transition to subcutaneous - Continue ECMO support, bival serves for VTE tx as well - Finish course of cefepime for possible CAP - Continue PTA statin, synthroid  Best practice (evaluated daily)  Diet: TF Pain/Anxiety/Delirium protocol (if indicated) profile is off.  Wean dexmedetomidine and fentanyl sequentially.  Seroquel added for possible hypoactive delirium VAP protocol (if indicated): yes DVT prophylaxis: Bivalirudin GI prophylaxis: pantoprazole  Glucose control: insulin gtt Mobility: bedrest Last date of multidisciplinary goals of care discussion: wife updated over the phone 12/8 Family and staff present RN, physicain Summary of discussion con't aggressive care Follow up goals of care discussion due: 12/15 Code Status: full Disposition: ICU   CRITICAL CARE Performed by: 14/8   Total critical care time: 45 minutes  Critical care time was exclusive of separately billable procedures and treating other patients.  Critical care was necessary to treat or prevent imminent or life-threatening deterioration.  Critical care was time spent personally by me on the following activities: development of treatment plan with patient and/or surrogate as well as nursing, discussions with consultants, evaluation of patient's response to treatment, examination of patient, obtaining history from patient or surrogate, ordering and performing treatments and interventions, ordering and review of laboratory studies, ordering and review of radiographic studies, pulse oximetry, re-evaluation of patient's condition and participation in multidisciplinary rounds.  1/16, MD [facility] ICU Physician [facility] [place] Critical [facility]  Pager: [phone] Mobile: [phone] After hours: [phone].

## 2020-09-08 NOTE — Evaluation (Addendum)
Physical Therapy Evaluation Patient Details Name: Donald Rowe MRN: [id number] DOB: [birth year] Today's Date: 09/08/2020   History of Present Illness  59 y.o. male with a pertinent history of hypothyroidism on levothyroxine and hyperlipidemia who was diagnosed with Covid 11/27 and is not vaccinated.  With EMS SaO2 on RA 78%O2. ED patient was increased to 6 L oxygen satting about 92%,. CTA of the Chest was assessed and negative for pulmonary embolism but showed diffuse bilateral infiltrates. He was treated with IV remdesivir, steroids and baricitinib.  Initially, he was also treated with antibiotics but these were stopped on 12/6 with a low procalcitonin. 12/07 intubated, central line, A line, 12/08 ECMO cannulation, cortrak, attempted extubation 12/9 failed reintubated.  Clinical Impression  RN reported pt to be weaned off Precedex and requested Evaluation and assist with rousing pt in hopes of attempt at extubation today. Despite maximal stimulation through PROM, noxious stimuli and loud commanding of movement. Pt with only one brief response to tickling of L foot. Pt PROM of UE/LE WFL. Unable to mobilize pt to seated position due to ECMO placement and total A needed to achieve. Therapy will follow back for more thorough Evaluation when pt is more responsive.     Follow Up Recommendations SNF    Equipment Recommendations  Other (comment) (TBD)       Precautions / Restrictions Precautions Precaution Comments: COVID+, ECMO, cortrak, intubated, flexiseal, condom cath                       Extremity/Trunk Assessment   Upper Extremity Assessment Upper Extremity Assessment: Difficult to assess due to impaired cognition (PROM WFL)    Lower Extremity Assessment Lower Extremity Assessment: Difficult to assess due to impaired cognition (PROM [facility])       Communication    None  Cognition Arousal/Alertness: Lethargic;Suspect due to medications (weaning from Precedex during session)    Overall Cognitive Status: Difficult to assess                                 General Comments: RN in hopes that with movement with therapy pt will begin to rouse, however despite best efforts and various stimuli      General Comments General comments (skin integrity, edema, etc.): RN in hopes that with movement with therapy pt will begin to rouse, however despite best efforts and various noxious stimuli, pt fighting intubation occassionally and 1x responded to L foot being tickled otherwise no purposeful response to commands     Assessment/Plan    PT Assessment Patient needs continued PT services  PT Problem List Decreased strength;Decreased activity tolerance;Cardiopulmonary status limiting activity       PT Treatment Interventions DME instruction;Gait training;Functional mobility training;Therapeutic activities;Therapeutic exercise;Balance training;Cognitive remediation;Patient/family education    PT Goals (Current goals can be found in the Care Plan section)  Acute Rehab PT Goals PT Goal Formulation: Patient unable to participate in goal setting Time For Goal Achievement: 09/22/20 Potential to Achieve Goals: Poor    Frequency Min 3X/week    AM-PAC PT "6 Clicks" Mobility  Outcome Measure Help needed turning from your back to your side while in a flat bed without using bedrails?: Total Help needed moving from lying on your back to sitting on the side of a flat bed without using bedrails?: Total Help needed moving to and from a bed to a chair (including a wheelchair)?: Total Help needed standing up from  a chair using your arms (e.g., wheelchair or bedside chair)?: Total Help needed to walk in hospital room?: Total Help needed climbing 3-5 steps with a railing? : Total 6 Click Score: 6    End of Session     Patient left: in bed;with nursing/sitter in room   PT Visit Diagnosis: Muscle weakness (generalized) (M62.81);Difficulty in walking, not elsewhere  classified (R26.2)    Time: 1004-1020 PT Time Calculation (min) (ACUTE ONLY): 16 min   Charges:   PT Evaluation $PT Eval Moderate Complexity: 1 Mod          Charolett Yarrow B. Beverely Risen PT, DPT Acute Rehabilitation Services Pager [phone] Office [phone]   Elon Alas Fleet 09/08/2020, 12:12 PM

## 2020-09-08 NOTE — Progress Notes (Addendum)
ANTICOAGULATION CONSULT NOTE  Pharmacy Consult for bivalirudin Indication: ECMO  No Known Allergies  Patient Measurements: Height: 5\' 9"  (175.3 cm) Weight: 109.9 kg (242 lb 4.6 oz) IBW/kg (Calculated) : 70.7  Vital Signs: Temp: 100.58 F (38.1 C) (12/10 1700) Temp Source: Bladder (12/10 1700) BP: 153/56 (12/10 1632) Pulse Rate: 121 (12/10 1700)  Labs: Recent Labs    09/13/2020 1740 09/17/2020 1742 09/04/2020 1750 09/03/2020 2111 09/07/20 0438 09/07/20 0620 09/07/20 1655 09/07/20 1713 09/08/20 0343 09/08/20 0351 09/08/20 0953 09/08/20 1230 09/08/20 1639 09/08/20 1651  HGB  --    < > 12.3*   < > 10.4*  9.5*   < > 10.5*   < > 9.7*   < >  --  9.2* 10.3* 9.9*  HCT  --    < > 36.4*   < > 31.3*  28.0*   < > 32.6*   < > 30.5*   < >  --  27.0* 30.5* 29.0*  PLT  --   --  143*   < > 141*  --  146*  --  144*  --   --   --  149*  --   APTT  --   --  181*   < > 42*   < > 45*  --  52*  --  52*  --  52*  --   LABPROT  --   --  16.7*  --  17.3*  --   --   --  20.5*  --   --   --   --   --   INR  --   --  1.4*  --  1.5*  --   --   --  1.8*  --   --   --   --   --   HEPARINUNFRC 1.01*  --   --   --  <0.10*  --   --   --   --   --   --   --   --   --   CREATININE  --   --  1.17  --  1.22  --  0.96  --  1.04  --   --   --  0.97  --    < > = values in this interval not displayed.    Estimated Creatinine Clearance: 100.2 mL/min (by C-G formula based on SCr of 0.97 mg/dL).   Assessment: 22 yom unvaccinated presenting with severe COVID ARDs, intubated on 12/6, continued to have ventilation issues despite NMB and proning - started on VV ECMO on 12/8.  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 No s/sx of bleeding. Baseline aPTT 27.  APTT this evening came back at 52, on bivalirudin@0 .2 mg/kg/hr. Hgb 9.9, PTLC 149. No s/sx of bleeding or infusion issues - no issue with circuit.   Goal of Therapy:  aPTT 60-70 seconds Monitor platelets by anticoagulation protocol: Yes   Plan:  Increase  bivalirudin to 0.24 mg/kg/hr (dosing wt 103.6 kg) Order aPTT in 6 hours  Monitor daily aPTT, CBC, LDH, and for s/sx of bleeding  Thanks for allowing pharmacy to be a part of this patient's care.  14/8, PharmD, BCCCP Clinical Pharmacist  Phone: [phone] 09/08/2020 6:07 PM  Please check AMION for all [facility] Pharmacy phone numbers After 10:00 PM, call Main Pharmacy [phone]

## 2020-09-08 NOTE — Progress Notes (Signed)
Advanced Heart Failure Rounding Note   Subjective:    12/8 Cannulated for VV ECMO - 32 FR  RIJ Cresecent 12/9 Extubated/Reintubated for altered mental status  Questionable seizure activity yesterday. Head CT negative. EEG with diffuse encephalopathy.  Propofol now off. On Precedex and fentanyl. Still hasn't woken up.   Remains on insulin gtt.   On Bival. No bleeding.  CXR: diffuse bilateral infiltrates   ECMO  Speed 3400 Flow 4.7L Sweep 4.5 -> 6 dP 25 pVen  -95 SvO2 78%  ABG: 7.37/42/63/90% Hgb 9.7 LDH 369 -> 447 Lactic acid 1.8 PTT 52  Vent 60% PEEP 8 Plateau 24 TVs 400   Objective:   Weight Range:  Vital Signs:   Temp:  [97.7 F (36.5 C)-99.5 F (37.5 C)] 98.96 F (37.2 C) (12/10 0800) Pulse Rate:  [58-140] 95 (12/10 0800) Resp:  [0-31] 0 (12/10 0800) BP: (125-193)/(53-66) 154/55 (12/10 0715) SpO2:  [78 %-98 %] 94 % (12/10 0800) Arterial Line BP: (121-323)/(-16-234) 152/55 (12/10 0800) FiO2 (%):  [40 %-100 %] 60 % (12/10 0750) Weight:  [109.9 kg] 109.9 kg (12/10 0408) Last BM Date: 09/08/20  Weight change: Filed Weights   08/30/2020 0342 09/07/20 0300 09/08/20 0408  Weight: 103.6 kg 102.6 kg 109.9 kg    Intake/Output:   Intake/Output Summary (Last 24 hours) at 09/08/2020 0820 Last data filed at 09/08/2020 0800 Gross per 24 hour  Intake 5653.93 ml  Output 2450 ml  Net 3203.93 ml     Physical Exam: General:  Intubated sedated HEENT: normal + ETT Neck: supple. + ECMO cannula   Cor: PMI nondisplaced. Regular rate & rhythm. No rubs, gallops or murmurs. Lungs: + rhonchi Abdomen: obese soft, nontender, nondistended. No hepatosplenomegaly. No bruits or masses. Good bowel sounds. Extremities: no cyanosis, clubbing, rash, edema Neuro: sedated no response to noxious stimuli. + gag   Telemetry: sinus 90s Personally reviewed   Labs: Basic Metabolic Panel: Recent Labs  Lab 09/03/20 0143 09/04/20 0255 09/09/2020 0334 09/04/2020 0846  09/20/2020 1750 09/07/20 0021 09/07/20 0438 09/07/20 0620 09/07/20 1655 09/07/20 1713 09/07/20 2009 09/07/20 2128 09/07/20 2334 09/08/20 0343 09/08/20 0351  NA 134*   < > 139   < > 144   < > 144  145   < > 147*   < > 150* 152* 151* 146* 149*  K 3.9   < > 4.9   < > 4.4   < > 4.3  4.3   < > 3.9   < > 4.0 3.0* 4.4 4.4 4.3  CL 100   < > 107  --  113*  --  112*  --  116*  --   --   --   --  116*  --   CO2 23   < > 25  --  23  --  26  --  24  --   --   --   --  23  --   GLUCOSE 118*   < > 253*  --  161*  --  214*  --  136*  --   --   --   --  231*  --   BUN 15   < > 41*  --  32*  --  40*  --  38*  --   --   --   --  40*  --   CREATININE 1.08   < > 1.25*  --  1.17  --  1.22  --  0.96  --   --   --   --  1.04  --   CALCIUM 8.4*   < > 8.4*  --  8.1*  --  7.9*  --  7.8*  --   --   --   --  7.5*  --   MG 1.8  --  2.9*  --   --   --  2.5*  --   --   --   --   --   --  2.8*  --   PHOS 1.4*  --  4.3  --   --   --  2.0*  --   --   --   --   --   --  1.8*  --    < > = values in this interval not displayed.    Liver Function Tests: Recent Labs  Lab 09/05/20 0258 08/31/2020 0334 09/19/2020 1750 09/07/20 0438 09/08/20 0343  AST 71* 38 32 28 42*  ALT 51* 41 33 28 34  ALKPHOS 116 107 87 71 67  BILITOT 0.8 0.7 0.9 0.8 1.2  PROT 5.2* 5.0* 4.4* 4.5* 4.3*  ALBUMIN 2.5* 2.3* 2.0* 2.6* 2.3*   No results for input(s): LIPASE, AMYLASE in the last 168 hours. No results for input(s): AMMONIA in the last 168 hours.  CBC: Recent Labs  Lab 09/03/20 0143 09/04/20 0255 09/05/20 0258 09/01/2020 0334 09/13/2020 0846 09/10/2020 1750 09/07/20 0012 09/07/20 0021 09/07/20 0438 09/07/20 0620 09/07/20 1655 09/07/20 1713 09/07/20 2009 09/07/20 2128 09/07/20 2334 09/08/20 0343 09/08/20 0351  WBC 11.6* 17.6* 17.4* 14.5*  --  6.8 5.8  --  9.0  --  9.9  --   --   --   --  11.0*  --   NEUTROABS 9.7* 14.6* 15.0* 12.0*  --   --   --   --  7.7  --   --   --   --   --   --   --   --   HGB 14.8 14.2 13.5 13.8   < >  12.3* 10.6*   < > 10.4*  9.5*   < > 10.5*   < > 9.5* 7.1* 8.8* 9.7* 8.8*  HCT 43.9 42.3 40.8 43.9   < > 36.4* 33.0*   < > 31.3*  28.0*   < > 32.6*   < > 28.0* 21.0* 26.0* 30.5* 26.0*  MCV 91.8 91.8 92.5 97.6  --  93.1 95.4  --  94.8  --  94.8  --   --   --   --  95.6  --   PLT 150 150 152 173  --  143* 138*  --  141*  --  146*  --   --   --   --  144*  --    < > = values in this interval not displayed.    Cardiac Enzymes: No results for input(s): CKTOTAL, CKMB, CKMBINDEX, TROPONINI in the last 168 hours.  BNP: BNP (last 3 results) No results for input(s): BNP in the last 8760 hours.  ProBNP (last 3 results) No results for input(s): PROBNP in the last 8760 hours.    Other results:  Imaging: EEG  Result Date: 09/07/2020 Lora Havens, MD     09/07/2020  5:39 PM Patient Name: Donald Rowe MRN: [id number] Epilepsy Attending: Lora Havens Referring Physician/Provider: Dr Ina Homes Date: 09/07/2020 Duration: 24.54 mins Patient history: 59yo m with COVID, ams. EEG to evaluate for seizure. Level of alertness: comatose AEDs during EEG study: Propofol ( turned off during eeg)  Technical aspects: This EEG study was done with scalp electrodes positioned according to the 10-20 International system of electrode placement. Electrical activity was acquired at a sampling rate of _0  and reviewed with a high frequency filter of _1  and a low frequency filter of _2 . EEG data were recorded continuously and digitally stored. Description: EEG showed discontinuous pattern with  generalized polymorphic 3 to 6 Hz theta-delta slowing as well as brief periods of generalized attenuation lasting 2-4 seconds. Hyperventilation and photic stimulation were not performed.   ABNORMALITY -Continuous slow, generalized IMPRESSION: This study is suggestive of severe diffuse encephalopathy, nonspecific etiology but likely secondary to sedation. No seizures or definite epileptiform discharges were seen throughout the  recording. Lora Havens   DG Chest 1 View  Result Date: 09/07/2020 CLINICAL DATA:  Intubated EXAM: CHEST  1 VIEW COMPARISON:  Chest radiograph from earlier today. FINDINGS: Endotracheal tube tip is 3.2 cm above the carina. Enteric tube enters stomach with the tip not seen on this image. Left internal jugular central venous catheter terminates over the middle third of the SVC. ECMO catheter overlies the medial right chest. Stable cardiomediastinal silhouette with normal heart size. No pneumothorax. No pleural effusion. Low lung volumes with worsened severe diffuse patchy lung opacities with air bronchograms. IMPRESSION: 1. Well-positioned support structures. No pneumothorax. 2. Low lung volumes with worsened severe diffuse patchy lung opacities with air bronchograms, compatible with severe COVID-19 pneumonia. Electronically Signed   By: Ilona Sorrel M.D.   On: 09/07/2020 17:05   CT HEAD WO CONTRAST  Result Date: 09/07/2020 CLINICAL DATA:  59 year old male with seizure like activity status post severe COVID-19. On ECMO EXAM: CT HEAD WITHOUT CONTRAST TECHNIQUE: Contiguous axial images were obtained from the base of the skull through the vertex without intravenous contrast. COMPARISON:  None. FINDINGS: Brain: No midline shift, ventriculomegaly, mass effect, evidence of mass lesion, intracranial hemorrhage or evidence of cortically based acute infarction. Cerebral volume and gray-white matter differentiation within normal limits for age. Vascular: Mild Calcified atherosclerosis at the skull base. No suspicious intracranial vascular hyperdensity. Skull: Negative. Sinuses/Orbits: Left side nasal enteric tube in place. Mucosal thickening and fluid/bubbly opacity throughout the paranasal sinuses, relatively sparing the right maxillary. Mild to moderate bilateral mastoid effusions. Tympanic cavities relatively spared. Other: Visualized orbits and scalp soft tissues are within normal limits. IMPRESSION: 1. Normal  for age non contrast CT appearance of the brain. 2. Paranasal sinus inflammation and mastoid effusions in the setting of intubation. Left side nasal enteric tube in place. Electronically Signed   By: Genevie Ann M.D.   On: 09/07/2020 16:15   CARDIAC CATHETERIZATION  Result Date: 09/29/2020 See surgical note for result.  DG CHEST PORT 1 VIEW  Result Date: 09/08/2020 CLINICAL DATA:  Intubation.  ECMO.  COVID positive. EXAM: PORTABLE CHEST 1 VIEW COMPARISON:  09/07/2020. FINDINGS: Endotracheal tube, feeding tube, left IJ line, ECMO device in stable position. Stable cardiomegaly. Stable diffuse dense pulmonary infiltrates/edema. No prominent pleural effusion is identified. No pneumothorax. IMPRESSION: 1. Lines and tubes including ECMO device in stable position. 2. Stable diffuse dense bilateral pulmonary infiltrates/edema. No interim change. Electronically Signed   By: Marcello Moores  Register   On: 09/08/2020 07:56   DG Chest Port 1 View in am  Result Date: 09/07/2020 CLINICAL DATA:  Intubation.  ECMO.  COVID positive. EXAM: PORTABLE CHEST 1 VIEW COMPARISON:  09/17/2020. FINDINGS: Endotracheal tube, feeding tube, left IJ line, ECMO device in stable position. Cardiomegaly. Diffuse bilateral pulmonary infiltrates/edema again noted without interim change.  Low lung volumes. Small left pleural effusion again noted. No pneumothorax. IMPRESSION: 1. Lines and tubes including ECMO device in stable position. 2. Cardiomegaly with persistent bilateral pulmonary infiltrates/edema and small left pleural effusion. No interim change. Electronically Signed   By: Marcello Moores  Register   On: 09/07/2020 07:43   DG Chest Port 1 View  Result Date: 09/01/2020 CLINICAL DATA:  Central line placement EXAM: PORTABLE CHEST 1 VIEW COMPARISON:  September 05, 2020 FINDINGS: The heart size and mediastinal contours are within normal limits. Again noted are multifocal patchy airspace opacities with slight worsening within the right lung. A small left  pleural effusion is seen. ETT is 1.5 cm above the carina. A left-sided central venous catheter seen with the tip at the mid SVC. NG tube is seen coursing below the diaphragm. IMPRESSION: Lines and tubes in unchanged position. Slight interval worsening in the airspace opacities throughout the right lung and stable opacities throughout the left lung. Small left pleural effusion. Electronically Signed   By: Prudencio Pair M.D.   On: 09/10/2020 19:09   DG Abd Portable 1V  Result Date: 09/24/2020 CLINICAL DATA:  Feeding tube placement EXAM: PORTABLE ABDOMEN - 1 VIEW COMPARISON:  None. FINDINGS: Enteric tube passes into the horizontal segment of the duodenum. Bowel gas pattern is unremarkable. IMPRESSION: Enteric tube within the duodenum. Electronically Signed   By: Macy Mis M.D.   On: 09/26/2020 13:56   VAS [place] LOWER EXTREMITY VENOUS (DVT)  Result Date: 09/19/2020  Lower Venous DVT Study Indications: Covid-19, pre operative ECMO.  Limitations: Ventilation and bandages. Comparison Study: Prior negative bilateral lower extremity venous duplex done                   09/11/2020 is available for comparison. Performing Technologist: Sharion Dove RVS  Examination Guidelines: A complete evaluation includes B-mode imaging, spectral Doppler, color Doppler, and power Doppler as needed of all accessible portions of each vessel. Bilateral testing is considered an integral part of a complete examination. Limited examinations for reoccurring indications may be performed as noted. The reflux portion of the exam is performed with the patient in reverse Trendelenburg.  +---------+---------------+---------+-----------+----------+------------------+ RIGHT    CompressibilityPhasicitySpontaneityPropertiesThrombus Aging     +---------+---------------+---------+-----------+----------+------------------+ CFV      Full           Yes      Yes                                      +---------+---------------+---------+-----------+----------+------------------+ SFJ      Full                                                            +---------+---------------+---------+-----------+----------+------------------+ FV Prox  Full                                                            +---------+---------------+---------+-----------+----------+------------------+ FV Mid   Full                                                            +---------+---------------+---------+-----------+----------+------------------+  FV DistalFull                                                            +---------+---------------+---------+-----------+----------+------------------+ PFV      Full                                                            +---------+---------------+---------+-----------+----------+------------------+ POP      Full           Yes      Yes                                     +---------+---------------+---------+-----------+----------+------------------+ PTV      None                                         acute, proximal                                                          calf               +---------+---------------+---------+-----------+----------+------------------+ PERO     None                                         acute, proximal                                                          calf               +---------+---------------+---------+-----------+----------+------------------+ Gastroc  None                                         Acute              +---------+---------------+---------+-----------+----------+------------------+   +---------+---------------+---------+-----------+----------+-------------------+ LEFT     CompressibilityPhasicitySpontaneityPropertiesThrombus Aging      +---------+---------------+---------+-----------+----------+-------------------+ CFV      Full            Yes      Yes                                      +---------+---------------+---------+-----------+----------+-------------------+ SFJ      Full                                                             +---------+---------------+---------+-----------+----------+-------------------+  FV Prox  Full                                                             +---------+---------------+---------+-----------+----------+-------------------+ FV Mid                                                Not visualized                                                            secondary to                                                              bandages            +---------+---------------+---------+-----------+----------+-------------------+ FV DistalFull                                                             +---------+---------------+---------+-----------+----------+-------------------+ PFV      Full                                                             +---------+---------------+---------+-----------+----------+-------------------+ POP      Full           Yes      Yes                                      +---------+---------------+---------+-----------+----------+-------------------+ PTV      Full                                                             +---------+---------------+---------+-----------+----------+-------------------+ PERO     None                                         acute, proximal  calf                +---------+---------------+---------+-----------+----------+-------------------+ Gastroc  None                                         Acute               +---------+---------------+---------+-----------+----------+-------------------+    Summary: RIGHT: - Findings consistent with acute deep vein thrombosis involving the right posterior tibial  veins, right peroneal veins, and right gastrocnemius veins. - Findings suggest new clot progression as compared to previous examination.  LEFT: - Findings consistent with acute deep vein thrombosis involving the left peroneal veins, and left gastrocnemius veins. - Findings suggest new clot progression as compared to previous examination.  *See table(s) above for measurements and observations. Electronically signed by Servando Snare MD on 09/29/2020 at 3:12:22 PM.    Final    ECHOCARDIOGRAM LIMITED  Result Date: 09/11/2020    ECHOCARDIOGRAM LIMITED REPORT   Patient Name:   MELQUAN ERNSBERGER Date of Exam: 08/30/2020 Medical Rec #:  [id number]      Height:       69.0 in Accession #:    [id number]     Weight:       228.4 lb Date of Birth:  1961/05/21       BSA:          2.186 m Patient Age:    47 years       BP:           122/55 mmHg Patient Gender: M              HR:           60 bpm. Exam Location:  Inpatient Procedure: Cardiac Doppler, Color Doppler and Limited Echo STAT ECHO Indications:    Cardiomyopathy  History:        Patient has no prior history of Echocardiogram examinations.                 Signs/Symptoms:Shortness of Breath and Fever; Risk                 Factors:Dyslipidemia. Covid+.  Sonographer:    Dustin Flock Referring Phys: [id number] [place]  1. Left ventricular ejection fraction, by estimation, is 60 to 65%. The left ventricle has normal function. The left ventricle has no regional wall motion abnormalities. There is mild left ventricular hypertrophy. Left ventricular diastolic parameters are indeterminate.  2. Right ventricular systolic function is normal. The right ventricular size is normal. There is moderately elevated pulmonary artery systolic pressure.  3. The mitral valve is normal in structure. No evidence of mitral valve regurgitation.  4. The aortic valve was not well visualized. Aortic valve regurgitation is not visualized. No aortic stenosis is present. FINDINGS  Left  Ventricle: Left ventricular ejection fraction, by estimation, is 60 to 65%. The left ventricle has normal function. The left ventricle has no regional wall motion abnormalities. The left ventricular internal cavity size was normal in size. There is  mild left ventricular hypertrophy. Left ventricular diastolic parameters are indeterminate. Right Ventricle: The right ventricular size is normal. Right ventricular systolic function is normal. There is moderately elevated pulmonary artery systolic pressure. The tricuspid regurgitant velocity is 3.34 m/s, and with an assumed right atrial pressure of 10 mmHg, the estimated right ventricular systolic pressure is 95.0 mmHg. Pericardium: There is  no evidence of pericardial effusion. Mitral Valve: The mitral valve is normal in structure. Tricuspid Valve: The tricuspid valve is normal in structure. Tricuspid valve regurgitation is mild. Aortic Valve: The aortic valve was not well visualized. Aortic valve regurgitation is not visualized. No aortic stenosis is present. Aorta: The aortic root is normal in size and structure. Venous: IVC assessment for right atrial pressure unable to be performed due to mechanical ventilation. IAS/Shunts: No atrial level shunt detected by color flow Doppler. LEFT VENTRICLE PLAX 2D LVIDd:         4.60 cm  Diastology LVIDs:         2.70 cm  LV e' medial:    8.38 cm/s LV PW:         1.20 cm  LV E/e' medial:  7.9 LV IVS:        1.20 cm  LV e' lateral:   7.72 cm/s LVOT diam:     1.90 cm  LV E/e' lateral: 8.6 LVOT Area:     2.84 cm  RIGHT VENTRICLE RV S prime:     3.05 cm/s LEFT ATRIUM         Index LA diam:    3.30 cm 1.51 cm/m   AORTA Ao Root diam: 2.60 cm MITRAL VALVE               TRICUSPID VALVE MV Area (PHT): 3.03 cm    TR Peak grad:   44.6 mmHg MV Decel Time: 250 msec    TR Vmax:        334.00 cm/s MV E velocity: 66.40 cm/s MV A velocity: 59.10 cm/s  SHUNTS MV E/A ratio:  1.12        Systemic Diam: 1.90 cm Oswaldo Milian MD  Electronically signed by Oswaldo Milian MD Signature Date/Time: 09/24/2020/10:36:22 AM    Final      Medications:     Scheduled Medications: . sodium chloride   Intravenous Once  . aspirin  81 mg Per Tube Daily  . baricitinib  4 mg Per Tube Daily  . chlorhexidine gluconate (MEDLINE KIT)  15 mL Mouth Rinse BID  . Chlorhexidine Gluconate Cloth  6 each Topical Daily  . clonazePAM  2 mg Per Tube BID  . docusate  100 mg Per Tube BID  . feeding supplement (PIVOT 1.5 CAL)  1,000 mL Per Tube Q24H  . feeding supplement (PROSource TF)  45 mL Per Tube QID  . free water  200 mL Per Tube Q4H  . levothyroxine  50 mcg Per Tube Q0600  . mouth rinse  15 mL Mouth Rinse 10 times per day  . methylPREDNISolone (SOLU-MEDROL) injection  40 mg Intravenous Q12H  . oxyCODONE  5 mg Per Tube Q6H  . pantoprazole (PROTONIX) IV  40 mg Intravenous Q24H  . polyethylene glycol  17 g Per Tube Daily  . pravastatin  40 mg Per Tube q1800  . sodium chloride flush  10-40 mL Intracatheter Q12H  . sorbitol  60 mL Per Tube Q0600    Infusions: . sodium chloride 10 mL/hr at 09/08/20 0800  . albumin human 12.5 g (09/07/20 1449)  . bivalirudin (ANGIOMAX) infusion 0.5 mg/mL (Non-ACS indications) 0.17 mg/kg/hr (09/08/20 0800)  . ceFEPime (MAXIPIME) IV Stopped (09/08/20 0014)  . dexmedetomidine (PRECEDEX) IV infusion 0.2 mcg/kg/hr (09/08/20 0800)  . dextrose 5 % and 0.45% NaCl Stopped (09/07/20 0130)  . fentaNYL infusion INTRAVENOUS 25 mcg/hr (09/08/20 0800)  . insulin 5 mL/hr at 09/08/20 0800  . norepinephrine (LEVOPHED)  Adult infusion Stopped (09/07/20 0730)  . propofol (DIPRIVAN) infusion Stopped (09/08/20 0711)    PRN Medications: acetaminophen, albumin human, dextrose, fentaNYL, midazolam, midazolam, ondansetron **OR** ondansetron (ZOFRAN) IV, polyethylene glycol, sodium chloride flush   Assessment/Plan:   1. Acute hypoxic respiratory failure/ARDS in setting of COVID-19 PNA - Cannulated for VV ECMO on 12/8  after failing intubation x 2 days - has complete remedisivir, baricitinib - continue steroids and abx (cefepime) - stop dates in place - Circuit looks good.  - CXR diffuse infiltrates. Cannula in good position  - Will try to wean sedation and assess mental status - on bival. APTT low. Goal 60-70. Discussed dosing with PharmD personally.  2. Bilateral DVT - continue bival as above  3. DM2 - on insulin gtt. adjusted  4. Obesity -Body mass index is 33.73 kg/m.  5. F/E/N - sodium climbing - increase FW  6. HTN - add metoprolol   Plan discussed on multi-discplinary ECMO rounds with CCM, ECMO coordinator/specialist, PharmDs and RNs.  CRITICAL CARE Performed by: Glori Bickers  Total critical care time: 45 minutes  Critical care time was exclusive of separately billable procedures and treating other patients.  Critical care was necessary to treat or prevent imminent or life-threatening deterioration.  Critical care was time spent personally by me (independent of midlevel providers or residents) on the following activities: development of treatment plan with patient and/or surrogate as well as nursing, discussions with consultants, evaluation of patient's response to treatment, examination of patient, obtaining history from patient or surrogate, ordering and performing treatments and interventions, ordering and review of laboratory studies, ordering and review of radiographic studies, pulse oximetry and re-evaluation of patient's condition.    Length of Stay: 6   Glori Bickers MD 09/08/2020, 8:20 AM  Advanced Heart Failure Team Pager [phone] (M-F; [place])  Please contact [facility] for night-coverage after hours (4p -7a ) and weekends on [email or web address]

## 2020-09-08 NOTE — Progress Notes (Signed)
ANTICOAGULATION CONSULT NOTE  Pharmacy Consult for bivalirudin Indication: ECMO  No Known Allergies  Patient Measurements: Height: 5\' 9"  (175.3 cm) Weight: 109.9 kg (242 lb 4.6 oz) IBW/kg (Calculated) : 70.7  Vital Signs: Temp: 98.96 F (37.2 C) (12/10 0750) Temp Source: Oral (12/10 0750) BP: 154/55 (12/10 0715) Pulse Rate: 95 (12/10 0750)  Labs: Recent Labs    09/05/2020 1740 09/15/2020 1742 09/19/2020 1750 09/27/2020 2111 09/07/20 0438 09/07/20 0620 09/07/20 1002 09/07/20 1008 09/07/20 1655 09/07/20 1713 09/07/20 2334 09/08/20 0343 09/08/20 0351  HGB  --    < > 12.3*   < > 10.4*  9.5*   < >  --    < > 10.5*   < > 8.8* 9.7* 8.8*  HCT  --    < > 36.4*   < > 31.3*  28.0*   < >  --    < > 32.6*   < > 26.0* 30.5* 26.0*  PLT  --   --  143*   < > 141*  --   --   --  146*  --   --  144*  --   APTT  --   --  181*   < > 42*  --  45*  --  45*  --   --  52*  --   LABPROT  --   --  16.7*  --  17.3*  --   --   --   --   --   --  20.5*  --   INR  --   --  1.4*  --  1.5*  --   --   --   --   --   --  1.8*  --   HEPARINUNFRC 1.01*  --   --   --  <0.10*  --   --   --   --   --   --   --   --   CREATININE  --   --  1.17  --  1.22  --   --   --  0.96  --   --  1.04  --    < > = values in this interval not displayed.    Estimated Creatinine Clearance: 93.5 mL/min (by C-G formula based on SCr of 1.04 mg/dL).   Assessment: 87 yom unvaccinated presenting with severe COVID ARDs, intubated on 12/6, continued to have ventilation issues despite NMB and proning - started on VV ECMO on 12/8.  Received 7500 units of heparin bolus in cath lab during cannulation. Hgb 12.6, plt 173. 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 No s/sx of bleeding. Baseline aPTT 27  Follow up aPTT below goal at 52 sec, unchanged after adjustment this morning. Hg 8.8, PTLC 144. No bleeding noted on ecmo rounds this am.   Goal of Therapy:  aPTT 60-70 seconds Monitor platelets by anticoagulation protocol: Yes    Plan:  Increase bivalirudin to 0.2 mg/kg/hr (dosing wt 103.6 kg) Aptt this afternoon  Thanks for allowing pharmacy to be a part of this patient's care.  14/8 PharmD., BCPS Clinical Pharmacist 09/08/2020 7:54 AM

## 2020-09-08 NOTE — Consult Note (Signed)
Palliative Medicine Inpatient Consult Note  Reason for consult:  "ECMO"  HPI:  Per intake H&P --> Donald Rowe is a 59 y.o. male with a pertinent history of hypothyroidism on levothyroxine and hyperlipidemia who was diagnosed with Covid 1 week ago and is not vaccinated.  With EMS he was seen to be desaturating to 78% on room air.  He states he started having symptoms on Friday and tested positive on Saturday.   Had initially elected to be DNAR without intubation though later changed this and got intubated on 12/6  in the setting of worsening respiratory failure with hypoxia. CTA chest w/ diffuse bilateral infiltrates. On 12/8 was cannulated for VV ECMO. Had been extubated on 12/9 then emergently reintubated in early evening due to clinical instability.   Palliative care was asked to get involved in the setting of VV ECMO to set goals and expectations with Donald Rowe's family.  Clinical Assessment/Goals of Care: I have reviewed medical records including EPIC notes, labs and imaging, received report from bedside RN, assessed the patient.    I called Donald Rowe (spouse) to further discuss diagnosis prognosis, GOC, EOL wishes, disposition and options.   I introduced Palliative Medicine as specialized medical care for people living with serious illness. It focuses on providing relief from the symptoms and stress of a serious illness. The goal is to improve quality of life for both the patient and the family.  Donald shares with me that Donald Rowe and she live in [place], [place].  Donald Rowe was formally married and has 3 children from that marriage a daughter who is 5 a daughter who is 72 and a son who is 85.  His middle daughter and son both live out of state in [place] and [place].  He and Donald Rowe share a 10-month-old son. They have been married since 2017 - they had been friend for many years before that. 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 Donald shares that Donald Rowe gets the greatest enjoyment out of "bringing people to know God."   From a functional perspective, Donald Rowe was "running circles" around his family prior to developing COVID. He was exceptionally mobile and active in his community.   A detailed discussion was had today regarding advanced directives - Donald Rowe shares that she and Donald Rowe had started discussing these but never got around to completing them.    Concepts specific to code status, artifical feeding and hydration, continued IV antibiotics and rehospitalization was had. Donald states that if there is an opportunity for Donald Rowe to improve that they would like to pursue that.  We discussed ECMO being a last line of defense from a life support perspective. We talked about being hopeful for recovery though anticipating the possibility that a rapid decline could also occur. We talked about allowing time to see if Donald Rowe could improve. Discussed that some days will be better than others.  Donald was very thankful for our conversation. She does anticipate that she may need more emotional support as we progress into this journey but for now is in optimistic spirits. I shared that on the PMT we are hopeful to act as an additional blanket of support for she and her family during this tenuous time.    Discussed the importance of continued conversation with family and their  medical providers regarding overall plan of care and treatment options, ensuring decisions are within the context of the  patients values and GOCs.  Decision Maker: Donald Rowe (spouse) [phone]  SUMMARY OF RECOMMENDATIONS   Full Code Full Scope of Care  ECMO as a bridge to recovery  Spiritual support - Patient is a Education officer, environmental -- nondenominational  Ongoing PMT support - Plan to attend daily rounds with the medical teams  Code Status/Advance Care Planning: FULL CODE   Palliative Prophylaxis:   Oral Care, ROM,  Delirium and aspiration precuations  Additional Recommendations (Limitations, Scope, Preferences):  Continue current scope of care  Psycho-social/Spiritual:   Desire for further Chaplaincy support: Yes  Additional Recommendations: Education on ECMO and it's use a life supportive measure   Prognosis: Unclear  Discharge Planning: To be determined  Vitals:   09/08/20 0459 09/08/20 0500  BP: (!) 136/55   Pulse: 84 90  Resp: 15 (!) 9  Temp:  98.78 F (37.1 C)  SpO2: 95% 94%    Intake/Output Summary (Last 24 hours) at 09/08/2020 7867 Last data filed at 09/08/2020 6720 Gross per 24 hour  Intake 5575.85 ml  Output 2660 ml  Net 2915.85 ml   Last Weight  Most recent update: 09/08/2020  4:12 AM   Weight  109.9 kg (242 lb 4.6 oz)           Gen:  Middle age caucasian M intubated and sedated HEENT: dry mucous membranes CV: Regular rate and rhythm  PULM: Intubated Neuro: Sedated  PPS: 10%   This conversation/these recommendations were discussed with patient primary care team, Dr. Denese Killings  Time In: 1200 Time Out:  Total Time: 70 Greater than 50%  of this time was spent counseling and coordinating care related to the above assessment and plan.  [name] Palliative Medicine Team Team Cell Phone: [phone] Please utilize secure chat with additional questions, if there is no response within 30 minutes please call the above phone number  Palliative Medicine Team providers are available by phone from 7am to 7pm daily and can be reached through the team cell phone.  Should this patient require assistance outside of these hours, please call the patient's attending physician.

## 2020-09-08 NOTE — Progress Notes (Signed)
ANTICOAGULATION CONSULT NOTE  Pharmacy Consult for bivalirudin Indication: ECMO  No Known Allergies  Patient Measurements: Height: 5\' 9"  (175.3 cm) Weight: 109.9 kg (242 lb 4.6 oz) IBW/kg (Calculated) : 70.7  Vital Signs: Temp: 98.78 F (37.1 C) (12/10 0500) Temp Source: Oral (12/09 2000) BP: 136/55 (12/10 0459) Pulse Rate: 90 (12/10 0500)  Labs: Recent Labs    09/01/2020 1740 09/21/2020 1742 09/25/2020 1750 09/13/2020 2111 09/07/20 0438 09/07/20 0620 09/07/20 1002 09/07/20 1008 09/07/20 1655 09/07/20 1713 09/07/20 2334 09/08/20 0343 09/08/20 0351  HGB  --    < > 12.3*   < > 10.4*  9.5*   < >  --    < > 10.5*   < > 8.8* 9.7* 8.8*  HCT  --    < > 36.4*   < > 31.3*  28.0*   < >  --    < > 32.6*   < > 26.0* 30.5* 26.0*  PLT  --   --  143*   < > 141*  --   --   --  146*  --   --  144*  --   APTT  --   --  181*   < > 42*  --  45*  --  45*  --   --  52*  --   LABPROT  --   --  16.7*  --  17.3*  --   --   --   --   --   --  20.5*  --   INR  --   --  1.4*  --  1.5*  --   --   --   --   --   --  1.8*  --   HEPARINUNFRC 1.01*  --   --   --  <0.10*  --   --   --   --   --   --   --   --   CREATININE  --   --  1.17  --  1.22  --   --   --  0.96  --   --  1.04  --    < > = values in this interval not displayed.    Estimated Creatinine Clearance: 93.5 mL/min (by C-G formula based on SCr of 1.04 mg/dL).   Assessment: 44 yom unvaccinated presenting with severe COVID ARDs, intubated on 12/6, continued to have ventilation issues despite NMB and proning - started on VV ECMO on 12/8.  Received 7500 units of heparin bolus in cath lab during cannulation. Hgb 12.6, plt 173. 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 No s/sx of bleeding. Baseline aPTT 27  Initial aPTT elevated at 180sec right after cannulation and bivalirudin drip initiate at low drip rate.   Aptt remains below goal this am at 52 sec  Hg 8.8, PTLC 144 RN notes some bright red blood earlier when suctioned by RT but no  bleeding since then  Goal of Therapy:  aPTT 60-70 seconds Monitor platelets by anticoagulation protocol: Yes   Plan:  Increase bivalirudin to 0.17 mg/kg/hr (dosing wt 103.6 kg) Aptt q12h while on ECMO With CBC  Thanks for allowing pharmacy to be a part of this patient's care.  14/8, PharmD Clinical Pharmacist

## 2020-09-08 NOTE — Plan of Care (Signed)
  Problem: Fluid Volume: Goal: Hemodynamic stability will improve Outcome: Progressing   

## 2020-09-08 NOTE — Procedures (Signed)
Extracorporeal support note   ECLS support day: 1 Indication: COVD ARDS  Configuration: VV, RIJ 32Fr [place]  Pump speed: 3400 Pump flow: 4.68 Pump used: Cardiohelp  Sweep gas: 100%,6LPM  Circuit check: no clots Anticoagulant: bivalirudin Anticoagulation targets: PTT 50-70  Changes in support: Continue current support.  Will attempt extubation once more awake.  Anticipated goals/duration of support: bridge to recovery  Lynnell Catalan, MD [facility] ICU Physician [facility]  Pager: [phone] Or Epic Secure Chat After hours: [phone].  09/08/2020, 10:20 AM

## 2020-09-08 NOTE — Progress Notes (Addendum)
Nutrition Follow Up  DOCUMENTATION CODES:   Not applicable  INTERVENTION:   Monitor magnesium, potassium, and phosphorus daily for at least 3 days, MD to replete as needed, as pt is at risk for refeeding syndrome.  Tube feeding:  -Pivot 1.5 @ 50 ml/hr via Cortrak -Increase Q8 hours to goal rate of 65 ml/hr (1560 ml) -ProSource TF 45 ml BID -Free water flushes 300 ml Q4 hours   Provides: 2420 kcal, 170 grams protein, 1250 ml free water (3050 ml with flushes).   NUTRITION DIAGNOSIS:   Increased nutrient needs related to acute illness (COVID PNA) as evidenced by estimated needs   Ongoing  GOAL:   Patient will meet greater than or equal to 90% of their needs   Addressed via TF  MONITOR:   Vent status,Skin,Weight trends,Labs,I & O's,TF tolerance  REASON FOR ASSESSMENT:   Ventilator,Consult Enteral/tube feeding initiation and management  ASSESSMENT:   Patient with PMH significant for asthma, cancer, CHF, DM, HTN, sickle cell anemia, renal insufficiency, and hyperlipidemia. Presents this admission with severe COVID ARDS.   12/4- admit [facility] 12/6- hypoxia, intubated 12/7- transfer to [facility] for ECMO 12/8- VV ECMO cannulation, post pyloric Cortrak placed 12/9- extubated, re-intubated   Pt discussed during ICU rounds and with RN.   Off propofol. Off inulin drip. Cleviprex weaning. Head CT/EEG negative. Had BM today via rectal tube. Still hypernatremic, free water increased. Tolerating Pivot 1.5 @ 40 ml/hr. Phosphorus down but being repleted. Titrate tube feeding to new goal.   Patient remains intubated on ventilator support MV: 14.4 L/min Temp (24hrs), Avg:99.2 F (37.3 C), Min:98.24 F (36.8 C), Max:100.04 F (37.8 C)   Admission weight: 101.9 kg  Current weight: 109.9 kg   UOP: 2260 ml x 24 hrs  Stool: 400 ml x 24 hrs   Drips: NS @ 75 ml/hr, cleviprex, precedex  Medications: colace, SS novolog, levemir, solumedrol, Phos-nak  Labs: Na 149 (H) Phosphorus 1.8 (L) Mg  2.8 (H) CBG 133-253  Diet Order:   Diet Order            Diet NPO time specified  Diet effective now                 EDUCATION NEEDS:   Not appropriate for education at this time  Skin:  Skin Assessment: Reviewed RN Assessment  Last BM:  12/10  Height:   Ht Readings from Last 1 Encounters:  09/05/20 5\' 9"  (1.753 m)    Weight:   Wt Readings from Last 1 Encounters:  09/08/20 109.9 kg    Adjusted Body Weight:  84.5 kg   BMI:  Body mass index is 35.78 kg/m.  Estimated Nutritional Needs:   Kcal:  14/10/21  Protein:  145-180 grams  Fluid:  >/= 2 L/day  [phone] RD, LDN Clinical Nutrition Pager listed in AMION

## 2020-09-08 NOTE — Progress Notes (Signed)
OT Cancellation Note  Patient Details Name: Guy Seese MRN: [id number] DOB: 1961/05/22   Cancelled Treatment:    Reason Eval/Treat Not Completed: Patient not medically ready.  Continue efforts as appropriate.  Hanna Aultman D Dave Mergen 09/08/2020, 11:38 AM

## 2020-09-09 ENCOUNTER — Inpatient Hospital Stay (HOSPITAL_COMMUNITY): Payer: HRSA Program

## 2020-09-09 DIAGNOSIS — J9601 Acute respiratory failure with hypoxia: Secondary | ICD-10-CM | POA: Diagnosis not present

## 2020-09-09 DIAGNOSIS — Z7189 Other specified counseling: Secondary | ICD-10-CM | POA: Diagnosis not present

## 2020-09-09 DIAGNOSIS — U071 COVID-19: Secondary | ICD-10-CM | POA: Diagnosis not present

## 2020-09-09 DIAGNOSIS — Z515 Encounter for palliative care: Secondary | ICD-10-CM | POA: Diagnosis not present

## 2020-09-09 LAB — TYPE AND SCREEN
ABO/RH(D): O POS
Antibody Screen: NEGATIVE
Unit division: 0
Unit division: 0
Unit division: 0
Unit division: 0
Unit division: 0
Unit division: 0

## 2020-09-09 LAB — POCT I-STAT 7, (LYTES, BLD GAS, ICA,H+H)
Acid-Base Excess: 0 mmol/L (ref 0.0–2.0)
Acid-Base Excess: 0 mmol/L (ref 0.0–2.0)
Acid-Base Excess: 1 mmol/L (ref 0.0–2.0)
Acid-base deficit: 1 mmol/L (ref 0.0–2.0)
Acid-base deficit: 1 mmol/L (ref 0.0–2.0)
Bicarbonate: 23.6 mmol/L (ref 20.0–28.0)
Bicarbonate: 23.7 mmol/L (ref 20.0–28.0)
Bicarbonate: 24.3 mmol/L (ref 20.0–28.0)
Bicarbonate: 24.4 mmol/L (ref 20.0–28.0)
Bicarbonate: 26 mmol/L (ref 20.0–28.0)
Calcium, Ion: 1.26 mmol/L (ref 1.15–1.40)
Calcium, Ion: 1.25 mmol/L (ref 1.15–1.40)
Calcium, Ion: 1.26 mmol/L (ref 1.15–1.40)
Calcium, Ion: 1.26 mmol/L (ref 1.15–1.40)
Calcium, Ion: 1.28 mmol/L (ref 1.15–1.40)
HCT: 25 % — ABNORMAL LOW (ref 39.0–52.0)
HCT: 27 % — ABNORMAL LOW (ref 39.0–52.0)
HCT: 26 % — ABNORMAL LOW (ref 39.0–52.0)
HCT: 26 % — ABNORMAL LOW (ref 39.0–52.0)
HCT: 25 % — ABNORMAL LOW (ref 39.0–52.0)
Hemoglobin: 8.5 g/dL — ABNORMAL LOW (ref 13.0–17.0)
Hemoglobin: 9.2 g/dL — ABNORMAL LOW (ref 13.0–17.0)
Hemoglobin: 8.8 g/dL — ABNORMAL LOW (ref 13.0–17.0)
Hemoglobin: 8.8 g/dL — ABNORMAL LOW (ref 13.0–17.0)
Hemoglobin: 8.5 g/dL — ABNORMAL LOW (ref 13.0–17.0)
O2 Saturation: 93 %
O2 Saturation: 92 %
O2 Saturation: 84 %
O2 Saturation: 88 %
O2 Saturation: 92 %
Patient temperature: 36.9
Patient temperature: 36.6
Patient temperature: 36.7
Patient temperature: 36.6
Patient temperature: 36.8
Potassium: 4.3 mmol/L (ref 3.5–5.1)
Potassium: 4.1 mmol/L (ref 3.5–5.1)
Potassium: 4.1 mmol/L (ref 3.5–5.1)
Potassium: 4.1 mmol/L (ref 3.5–5.1)
Potassium: 4 mmol/L (ref 3.5–5.1)
Sodium: 149 mmol/L — ABNORMAL HIGH (ref 135–145)
Sodium: 150 mmol/L — ABNORMAL HIGH (ref 135–145)
Sodium: 149 mmol/L — ABNORMAL HIGH (ref 135–145)
Sodium: 148 mmol/L — ABNORMAL HIGH (ref 135–145)
Sodium: 148 mmol/L — ABNORMAL HIGH (ref 135–145)
TCO2: 25 mmol/L (ref 22–32)
TCO2: 25 mmol/L (ref 22–32)
TCO2: 25 mmol/L (ref 22–32)
TCO2: 26 mmol/L (ref 22–32)
TCO2: 27 mmol/L (ref 22–32)
pCO2 arterial: 36.8 mmHg (ref 32.0–48.0)
pCO2 arterial: 35.9 mmHg (ref 32.0–48.0)
pCO2 arterial: 37.2 mmHg (ref 32.0–48.0)
pCO2 arterial: 38.5 mmHg (ref 32.0–48.0)
pCO2 arterial: 40.2 mmHg (ref 32.0–48.0)
pH, Arterial: 7.415 (ref 7.350–7.450)
pH, Arterial: 7.425 (ref 7.350–7.450)
pH, Arterial: 7.422 (ref 7.350–7.450)
pH, Arterial: 7.409 (ref 7.350–7.450)
pH, Arterial: 7.417 (ref 7.350–7.450)
pO2, Arterial: 66 mmHg — ABNORMAL LOW (ref 83.0–108.0)
pO2, Arterial: 59 mmHg — ABNORMAL LOW (ref 83.0–108.0)
pO2, Arterial: 47 mmHg — ABNORMAL LOW (ref 83.0–108.0)
pO2, Arterial: 53 mmHg — ABNORMAL LOW (ref 83.0–108.0)
pO2, Arterial: 62 mmHg — ABNORMAL LOW (ref 83.0–108.0)

## 2020-09-09 LAB — CBC
HCT: 29.3 % — ABNORMAL LOW (ref 39.0–52.0)
HCT: 28.1 % — ABNORMAL LOW (ref 39.0–52.0)
Hemoglobin: 9.3 g/dL — ABNORMAL LOW (ref 13.0–17.0)
Hemoglobin: 9.1 g/dL — ABNORMAL LOW (ref 13.0–17.0)
MCH: 30.2 pg (ref 26.0–34.0)
MCH: 30.7 pg (ref 26.0–34.0)
MCHC: 31.7 g/dL (ref 30.0–36.0)
MCHC: 32.4 g/dL (ref 30.0–36.0)
MCV: 95.1 fL (ref 80.0–100.0)
MCV: 94.9 fL (ref 80.0–100.0)
Platelets: 139 10*3/uL — ABNORMAL LOW (ref 150–400)
Platelets: 143 10*3/uL — ABNORMAL LOW (ref 150–400)
RBC: 3.08 MIL/uL — ABNORMAL LOW (ref 4.22–5.81)
RBC: 2.96 MIL/uL — ABNORMAL LOW (ref 4.22–5.81)
RDW: 13.9 % (ref 11.5–15.5)
RDW: 13.7 % (ref 11.5–15.5)
WBC: 10.6 10*3/uL — ABNORMAL HIGH (ref 4.0–10.5)
WBC: 11 10*3/uL — ABNORMAL HIGH (ref 4.0–10.5)
nRBC: 0 % (ref 0.0–0.2)
nRBC: 0 % (ref 0.0–0.2)

## 2020-09-09 LAB — LACTIC ACID, PLASMA
Lactic Acid, Venous: 1.4 mmol/L (ref 0.5–1.9)
Lactic Acid, Venous: 1.6 mmol/L (ref 0.5–1.9)

## 2020-09-09 LAB — HEPATIC FUNCTION PANEL
ALT: 60 U/L — ABNORMAL HIGH (ref 0–44)
AST: 73 U/L — ABNORMAL HIGH (ref 15–41)
Albumin: 2.3 g/dL — ABNORMAL LOW (ref 3.5–5.0)
Alkaline Phosphatase: 79 U/L (ref 38–126)
Bilirubin, Direct: 0.9 mg/dL — ABNORMAL HIGH (ref 0.0–0.2)
Indirect Bilirubin: 1 mg/dL — ABNORMAL HIGH (ref 0.3–0.9)
Total Bilirubin: 1.9 mg/dL — ABNORMAL HIGH (ref 0.3–1.2)
Total Protein: 4.5 g/dL — ABNORMAL LOW (ref 6.5–8.1)

## 2020-09-09 LAB — BPAM RBC
Blood Product Expiration Date: 202201072359
Blood Product Expiration Date: 202201112359
Blood Product Expiration Date: 202201112359
Blood Product Expiration Date: 202201112359
Blood Product Expiration Date: 202201112359
Blood Product Expiration Date: 202201112359
ISSUE DATE / TIME: 202112091441
ISSUE DATE / TIME: 202112091441
ISSUE DATE / TIME: 202112091441
ISSUE DATE / TIME: 202112091441
ISSUE DATE / TIME: 202112111521
ISSUE DATE / TIME: 202112111922
Unit Type and Rh: 5100
Unit Type and Rh: 5100
Unit Type and Rh: 5100
Unit Type and Rh: 5100
Unit Type and Rh: 5100
Unit Type and Rh: 5100

## 2020-09-09 LAB — BASIC METABOLIC PANEL
Anion gap: 5 (ref 5–15)
Anion gap: 5 (ref 5–15)
BUN: 40 mg/dL — ABNORMAL HIGH (ref 6–20)
BUN: 41 mg/dL — ABNORMAL HIGH (ref 6–20)
CO2: 25 mmol/L (ref 22–32)
CO2: 25 mmol/L (ref 22–32)
Calcium: 7.9 mg/dL — ABNORMAL LOW (ref 8.9–10.3)
Calcium: 7.8 mg/dL — ABNORMAL LOW (ref 8.9–10.3)
Chloride: 118 mmol/L — ABNORMAL HIGH (ref 98–111)
Chloride: 115 mmol/L — ABNORMAL HIGH (ref 98–111)
Creatinine, Ser: 0.94 mg/dL (ref 0.61–1.24)
Creatinine, Ser: 0.81 mg/dL (ref 0.61–1.24)
GFR, Estimated: >60 mL/min (ref >60)
GFR, Estimated: >60 mL/min (ref >60)
Glucose, Bld: 231 mg/dL — ABNORMAL HIGH (ref 70–99)
Glucose, Bld: 213 mg/dL — ABNORMAL HIGH (ref 70–99)
Potassium: 4.4 mmol/L (ref 3.5–5.1)
Potassium: 4.1 mmol/L (ref 3.5–5.1)
Sodium: 148 mmol/L — ABNORMAL HIGH (ref 135–145)
Sodium: 145 mmol/L (ref 135–145)

## 2020-09-09 LAB — GLUCOSE, CAPILLARY
Glucose-Capillary: 179 mg/dL — ABNORMAL HIGH (ref 70–99)
Glucose-Capillary: 184 mg/dL — ABNORMAL HIGH (ref 70–99)
Glucose-Capillary: 195 mg/dL — ABNORMAL HIGH (ref 70–99)
Glucose-Capillary: 198 mg/dL — ABNORMAL HIGH (ref 70–99)
Glucose-Capillary: 207 mg/dL — ABNORMAL HIGH (ref 70–99)
Glucose-Capillary: 220 mg/dL — ABNORMAL HIGH (ref 70–99)

## 2020-09-09 LAB — LACTATE DEHYDROGENASE: LDH: 743 U/L — ABNORMAL HIGH (ref 98–192)

## 2020-09-09 LAB — PROTIME-INR
INR: 2.3 — ABNORMAL HIGH (ref 0.8–1.2)
Prothrombin Time: 24.5 seconds — ABNORMAL HIGH (ref 11.4–15.2)

## 2020-09-09 LAB — MAGNESIUM: Magnesium: 2.7 mg/dL — ABNORMAL HIGH (ref 1.7–2.4)

## 2020-09-09 LAB — APTT
aPTT: 60 seconds — ABNORMAL HIGH (ref 24–36)
aPTT: 59 seconds — ABNORMAL HIGH (ref 24–36)
aPTT: 61 seconds — ABNORMAL HIGH (ref 24–36)
aPTT: 60 seconds — ABNORMAL HIGH (ref 24–36)

## 2020-09-09 LAB — PHOSPHORUS: Phosphorus: 2.1 mg/dL — ABNORMAL LOW (ref 2.5–4.6)

## 2020-09-09 LAB — FIBRINOGEN: Fibrinogen: 326 mg/dL (ref 210–475)

## 2020-09-09 MED ORDER — METOPROLOL TARTRATE 25 MG/10 ML ORAL SUSPENSION
75.0000 mg | Freq: Two times a day (BID) | ORAL | Status: DC
  Administered 2020-09-09 – 2020-09-10 (×3): 75 mg
  Filled 2020-09-09 (×5): qty 30

## 2020-09-09 MED ORDER — ACETAMINOPHEN 160 MG/5ML PO SOLN
650.0000 mg | Freq: Four times a day (QID) | ORAL | Status: DC | PRN
  Administered 2020-10-06 – 2020-10-07 (×2): 650 mg
  Filled 2020-09-09 (×2): qty 20.3

## 2020-09-09 MED ORDER — SODIUM CHLORIDE 0.9 % IV SOLN
0.2900 mg/kg/h | INTRAVENOUS | Status: DC
  Administered 2020-09-09: 12:00:00 0.27 mg/kg/h via INTRAVENOUS
  Administered 2020-09-09 – 2020-09-10 (×2): 0.33 mg/kg/h via INTRAVENOUS
  Administered 2020-09-10: 19:00:00 0.34 mg/kg/h via INTRAVENOUS
  Administered 2020-09-11: 03:00:00 0.29 mg/kg/h via INTRAVENOUS
  Filled 2020-09-09 (×7): qty 250

## 2020-09-09 MED ORDER — PROPOFOL 1000 MG/100ML IV EMUL
5.0000 ug/kg/min | INTRAVENOUS | Status: DC
  Administered 2020-09-09: 30 ug/kg/min via INTRAVENOUS
  Administered 2020-09-10: 11:00:00 50 ug/kg/min via INTRAVENOUS
  Administered 2020-09-10: 14:00:00 60 ug/kg/min via INTRAVENOUS
  Administered 2020-09-10: 20:00:00 70 ug/kg/min via INTRAVENOUS
  Administered 2020-09-10: 08:00:00 40 ug/kg/min via INTRAVENOUS
  Administered 2020-09-10: 05:00:00 50 ug/kg/min via INTRAVENOUS
  Administered 2020-09-10: 18:00:00 65 ug/kg/min via INTRAVENOUS
  Administered 2020-09-10: 23:00:00 50 ug/kg/min via INTRAVENOUS
  Administered 2020-09-10: 16:00:00 65 ug/kg/min via INTRAVENOUS
  Administered 2020-09-10 – 2020-09-11 (×2): 50 ug/kg/min via INTRAVENOUS
  Administered 2020-09-11: 03:00:00 60 ug/kg/min via INTRAVENOUS
  Administered 2020-09-11: 18:00:00 50 ug/kg/min via INTRAVENOUS
  Administered 2020-09-11 (×3): 40 ug/kg/min via INTRAVENOUS
  Administered 2020-09-11 (×2): 50 ug/kg/min via INTRAVENOUS
  Administered 2020-09-12: 02:00:00 30 ug/kg/min via INTRAVENOUS
  Administered 2020-09-12: 06:00:00 40 ug/kg/min via INTRAVENOUS
  Administered 2020-09-13: 15:00:00 20 ug/kg/min via INTRAVENOUS
  Administered 2020-09-13: 20:00:00 40 ug/kg/min via INTRAVENOUS
  Administered 2020-09-13: 09:00:00 10 ug/kg/min via INTRAVENOUS
  Administered 2020-09-13 – 2020-09-14 (×2): 40 ug/kg/min via INTRAVENOUS
  Administered 2020-09-14: 06:00:00 50 ug/kg/min via INTRAVENOUS
  Administered 2020-09-14: 11:00:00 15 ug/kg/min via INTRAVENOUS
  Administered 2020-09-14: 06:00:00 50 ug/kg/min via INTRAVENOUS
  Administered 2020-09-15 – 2020-09-16 (×2): 5 ug/kg/min via INTRAVENOUS
  Administered 2020-09-17: 19:00:00 10 ug/kg/min via INTRAVENOUS
  Administered 2020-09-17 (×2): 15 ug/kg/min via INTRAVENOUS
  Administered 2020-09-18: 18:00:00 20 ug/kg/min via INTRAVENOUS
  Administered 2020-09-19 – 2020-09-20 (×3): 10 ug/kg/min via INTRAVENOUS
  Administered 2020-09-20: 12:00:00 60 ug/kg/min via INTRAVENOUS
  Administered 2020-09-20 – 2020-09-21 (×3): 20 ug/kg/min via INTRAVENOUS
  Filled 2020-09-09 (×2): qty 200
  Filled 2020-09-09 (×2): qty 100
  Filled 2020-09-09: qty 200
  Filled 2020-09-09 (×4): qty 100
  Filled 2020-09-09: qty 200
  Filled 2020-09-09: qty 100
  Filled 2020-09-09: qty 200
  Filled 2020-09-09 (×5): qty 100
  Filled 2020-09-09 (×2): qty 200
  Filled 2020-09-09 (×8): qty 100
  Filled 2020-09-09: qty 200
  Filled 2020-09-09 (×2): qty 100
  Filled 2020-09-09: qty 200
  Filled 2020-09-09: qty 100
  Filled 2020-09-09: qty 200

## 2020-09-09 MED ORDER — POTASSIUM CHLORIDE 20 MEQ PO PACK
20.0000 meq | PACK | Freq: Once | ORAL | Status: AC
  Administered 2020-09-09: 08:00:00 20 meq
  Filled 2020-09-09: qty 1

## 2020-09-09 MED ORDER — VALPROIC ACID 250 MG/5ML PO SOLN
500.0000 mg | Freq: Two times a day (BID) | ORAL | Status: DC
  Administered 2020-09-09 – 2020-09-14 (×11): 500 mg
  Filled 2020-09-09 (×11): qty 10

## 2020-09-09 MED ORDER — NUTRISOURCE FIBER PO PACK
1.0000 | PACK | Freq: Two times a day (BID) | ORAL | Status: DC
  Administered 2020-09-09 – 2020-10-12 (×66): 1
  Filled 2020-09-09 (×68): qty 1

## 2020-09-09 MED ORDER — POTASSIUM & SODIUM PHOSPHATES 280-160-250 MG PO PACK
1.0000 | PACK | Freq: Three times a day (TID) | ORAL | Status: AC
  Administered 2020-09-09 (×3): 1
  Filled 2020-09-09 (×3): qty 1

## 2020-09-09 MED ORDER — HYDRALAZINE HCL 25 MG PO TABS
25.0000 mg | ORAL_TABLET | Freq: Three times a day (TID) | ORAL | Status: DC
  Administered 2020-09-09 (×2): 25 mg
  Filled 2020-09-09 (×3): qty 1

## 2020-09-09 MED ORDER — FUROSEMIDE 10 MG/ML IJ SOLN
20.0000 mg | Freq: Once | INTRAMUSCULAR | Status: AC
  Administered 2020-09-09: 08:00:00 20 mg via INTRAVENOUS
  Filled 2020-09-09: qty 2

## 2020-09-09 MED ORDER — QUETIAPINE FUMARATE 100 MG PO TABS
100.0000 mg | ORAL_TABLET | Freq: Two times a day (BID) | ORAL | Status: DC
  Administered 2020-09-09 – 2020-09-25 (×33): 100 mg
  Filled 2020-09-09 (×33): qty 1

## 2020-09-09 NOTE — TOC Progression Note (Signed)
Transition of Care [facility] - Progression Note    Patient Details  Name: Donald Rowe MRN: [id number] Date of Birth: 1961-01-15  Transition of Care [facility]) CM/SW Contact  Lorri Frederick, LCSW Phone Number: 09/09/2020, 10:06 AM  Clinical Narrative:   PASSR obtained: [id number].  FL2 started but not completed.  Pt remains on vent, palliative involved.    Expected Discharge Plan: Home/Self Care Barriers to Discharge: No Barriers Identified  Expected Discharge Plan and Services Expected Discharge Plan: Home/Self Care   Discharge Planning Services: CM Consult   Living arrangements for the past 2 months: Single Family Home                                       Social Determinants of Health (SDOH) Interventions    Readmission Risk Interventions No flowsheet data found.

## 2020-09-09 NOTE — Progress Notes (Signed)
ANTICOAGULATION CONSULT NOTE  Pharmacy Consult for bivalirudin Indication: ECMO  No Known Allergies  Patient Measurements: Height: 5\' 9"  (175.3 cm) Weight: 115.9 kg (255 lb 8.2 oz) IBW/kg (Calculated) : 70.7  Vital Signs: Temp: 98.42 F (36.9 C) (12/11 1315) Temp Source: Bladder (12/11 1200) BP: 167/59 (12/11 0457) Pulse Rate: 121 (12/11 1315)  Labs: Recent Labs    09/23/2020 1740 09/05/2020 1742 09/07/20 0438 09/07/20 0620 09/08/20 0343 09/08/20 0351 09/08/20 1639 09/08/20 1651 09/08/20 2307 09/09/20 0458 09/09/20 0501 09/09/20 1002 09/09/20 1241 09/09/20 1320  HGB  --    < > 10.4*  9.5*   < > 9.7*   < > 10.3*   < >  --  9.3* 8.5* 9.2* 8.8*  --   HCT  --    < > 31.3*  28.0*   < > 30.5*   < > 30.5*   < >  --  29.3* 25.0* 27.0* 26.0*  --   PLT  --    < > 141*   < > 144*  --  149*  --   --  139*  --   --   --   --   APTT  --    < > 42*   < > 52*   < > 52*  --  61* 60*  --   --   --  59*  LABPROT  --    < > 17.3*  --  20.5*  --   --   --   --  24.5*  --   --   --   --   INR  --    < > 1.5*  --  1.8*  --   --   --   --  2.3*  --   --   --   --   HEPARINUNFRC 1.01*  --  <0.10*  --   --   --   --   --   --   --   --   --   --   --   CREATININE  --    < > 1.22   < > 1.04  --  0.97  --   --  0.94  --   --   --   --    < > = values in this interval not displayed.    Estimated Creatinine Clearance: 106.3 mL/min (by C-G formula based on SCr of 0.94 mg/dL).   Assessment: 37 yom unvaccinated presenting with severe COVID ARDs, intubated on 12/6, continued to have ventilation issues despite NMB and proning - started on VV ECMO on 12/8.  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   APTT down to 59 sec (therapeutic-low end) on bivalirudin@0 .27 mg/kg/hr. Hgb 8.5, PLT 139. No s/sx of bleeding or infusion issues - no issue with circuit.   Goal of Therapy:  aPTT 60-70 seconds Monitor platelets by anticoagulation protocol: Yes   Plan:  Increase bivalirudin to 0.3 mg/kg/hr  (dosing wt 103.6 kg) Recheck aptt tonight  14/8 PharmD., BCPS Clinical Pharmacist 09/09/2020 2:06 PM

## 2020-09-09 NOTE — Progress Notes (Signed)
ANTICOAGULATION CONSULT NOTE  Pharmacy Consult for bivalirudin Indication: ECMO  No Known Allergies  Patient Measurements: Height: 5\' 9"  (175.3 cm) Weight: 115.9 kg (255 lb 8.2 oz) IBW/kg (Calculated) : 70.7  Vital Signs: Temp: 98.24 F (36.8 C) (12/11 0700) Temp Source: Bladder (12/11 0400) BP: 167/59 (12/11 0457) Pulse Rate: 87 (12/11 0700)  Labs: Recent Labs    09/26/2020 1740 09/27/2020 1742 09/07/20 0438 09/07/20 0620 09/08/20 0343 09/08/20 0351 09/08/20 1639 09/08/20 1651 09/08/20 1954 09/08/20 2307 09/09/20 0458 09/09/20 0501  HGB  --    < > 10.4*  9.5*   < > 9.7*   < > 10.3*   < > 8.8*  --  9.3* 8.5*  HCT  --    < > 31.3*  28.0*   < > 30.5*   < > 30.5*   < > 26.0*  --  29.3* 25.0*  PLT  --    < > 141*   < > 144*  --  149*  --   --   --  139*  --   APTT  --    < > 42*   < > 52*   < > 52*  --   --  61* 60*  --   LABPROT  --    < > 17.3*  --  20.5*  --   --   --   --   --  24.5*  --   INR  --    < > 1.5*  --  1.8*  --   --   --   --   --  2.3*  --   HEPARINUNFRC 1.01*  --  <0.10*  --   --   --   --   --   --   --   --   --   CREATININE  --    < > 1.22   < > 1.04  --  0.97  --   --   --  0.94  --    < > = values in this interval not displayed.    Estimated Creatinine Clearance: 106.3 mL/min (by C-G formula based on SCr of 0.94 mg/dL).   Assessment: 35 yom unvaccinated presenting with severe COVID ARDs, intubated on 12/6, continued to have ventilation issues despite NMB and proning - started on VV ECMO on 12/8.  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 Baseline aPTT 27.  APTT 60 sec (therapeutic) on bivalirudin@0 .24 mg/kg/hr. Hgb 8.5, PLT 139. No s/sx of bleeding or infusion issues - no issue with circuit.   Goal of Therapy:  aPTT 60-70 seconds Monitor platelets by anticoagulation protocol: Yes   Plan:  Continue bivalirudin at 0.27 mg/kg/hr (dosing wt 103.6 kg) Recheck aptt at noon  14/8 PharmD., BCPS Clinical Pharmacist 09/09/2020 7:16  AM

## 2020-09-09 NOTE — Progress Notes (Signed)
NAME:  Donald Rowe, MRN:  [id number], DOB:  [birth year], LOS: 7 ADMISSION DATE:  09/10/2020, CONSULTATION DATE:  12/6 REFERRING MD:  Dr. David Stall, CHIEF COMPLAINT:  SOB    Brief History   59 y/o M admitted 12/4 with 1 week hx of SOB, known COVID positive.  He is unvaccinated.  CTA chest negative for PE but demonstrated diffuse bilateral infiltrates.   History of present illness   59 y/o unvaccinated male, admitted 12/4 with a one week history of shortness of breath.    The patient began feeling poorly the day before Thanksgiving with fever, weakness and body aches. He tested positive for COVID on 12/27 prior to presentation.  He developed progressive shortness of breath.  In the ER, the patient was found to have saturations of 78%.  He was started on 6L O2 with initial improvement in saturations.  The patient was admitted per [facility]. CTA of the Chest was assessed and negative for pulmonary embolism but showed diffuse bilateral infiltrates. He was treated with IV remdesivir, steroids and baricitinib.  Initially, he was also treated with antibiotics but these were stopped on 12/6 with a low procalcitonin.  LE doppler negative for DVT. The patient elected to be DNR / no intubation.  He developed worsening respiratory failure with hypoxia requiring 60L flow 100% and PCCM consulted 12/6 for evaluation.   Past Medical History  Hypothyroidism  HLD   Significant Hospital Events/Procedures  12/04 Admit  12/06 PCCM consulted  12/07 Intubated, central line, a line 12/08 VV ECMO Cannulaton, cortrak  Consults:  PCCM, Heart failure, TCTS  Significant Diagnostic Tests:   CTA Chest 12/4 >> extensive bilateral airspace disease, no large PE identified, limited study   LE Venous Duplex 12/4 >> negative for DVT bilaterally   Micro Data:  COVID 12/4 >> negative  Influenza A/B 12/4 >> negative  MRSA PCR 12/5 >> negative  BCx2 12/4 >>   Antimicrobials:  Azithromycin 12/5 >> 12/6  Ceftriaxone 12/5  >> 12/6   Interim history/subjective:  Failed initial attempt at extubation 12/9. Has continued to tolerate ECMO well but become agitated periodically with associated desaturation.   Objective   Blood pressure (!) 167/59, pulse (!) 102, temperature 98.42 F (36.9 C), resp. rate (!) 27, height 5\' 9"  (1.753 m), weight 115.9 kg, SpO2 91 %.    Vent Mode: PCV FiO2 (%):  [40 %] 40 % Set Rate:  [12 bmp] 12 bmp PEEP:  [10 cmH20] 10 cmH20 Plateau Pressure:  [17 cmH20] 17 cmH20   Intake/Output Summary (Last 24 hours) at 09/09/2020 1024 Last data filed at 09/09/2020 1000 Gross per 24 hour  Intake 5636.43 ml  Output 3740 ml  Net 1896.43 ml   Filed Weights   09/07/20 0300 09/08/20 0408 09/09/20 0500  Weight: 102.6 kg 109.9 kg 115.9 kg    Examination: Constitutional: ill appearing man on vent  Eyes: pupils 33mm, reactive.  HENT: ETT in place, minimal secretions, OGT in place.  Cardiovascular: RRR, ext warm heart sounds unremarkable. Respiratory: Crackles and bronchial breath sounds at bases bilaterally Gastrointestinal: Soft active bowel sounds rectal tube in place. Skin: No rashes, line sites intact Neurologic: Periodically agitated, moving all limbs, not following commands not tracking to voice.   Resolved Hospital Problem list      Assessment & Plan:   Critically ill due to acute hypoxemic and hypercarbic respiratory failure secondary to COVID PNA with severe ARDS. S/p VV ECMO cannulation 09/07/20.  Completed remdesivir.  On baricitinib. Critically ill due  to agitated delirium requiring titration of IV sedation. Elevated Pct question superimposed CAP Bilateral lower ext DVTs- assocated with COVID infection Steroid-induced hyperglycemia Hypothyroidism, HLD- on home meds  Plan:  -Slow sedation wean we will reattempt extubation once awake - delirium is presently limiting extubation - Will stop steroids as may be contributing to delirium  -Continue ultra lung protective  ventilation strategy, Tolerating SBT with good Vt - Continue baricitinib -Transition to subcutaneous - Continue ECMO support, bival serves for VTE tx as well - Finish course of cefepime for possible CAP - Continue PTA statin, synthroid  Best practice (evaluated daily)  Diet: TF Pain/Anxiety/Delirium protocol (if indicated) profile is off.  Wean dexmedetomidine and fentanyl sequentially.  Seroquel added for possible hypoactive delirium VAP protocol (if indicated): yes DVT prophylaxis: Bivalirudin GI prophylaxis: pantoprazole  Glucose control: insulin gtt Mobility: bedrest Last date of multidisciplinary goals of care discussion: wife updated over the phone 12/11 Family and staff present RN, physicain Summary of discussion con't aggressive care Follow up goals of care discussion due: 12/15 Code Status: full Disposition: ICU   CRITICAL CARE Performed by: Lynnell Catalan   Total critical care time: 45 minutes  Critical care time was exclusive of separately billable procedures and treating other patients.  Critical care was necessary to treat or prevent imminent or life-threatening deterioration.  Critical care was time spent personally by me on the following activities: development of treatment plan with patient and/or surrogate as well as nursing, discussions with consultants, evaluation of patient's response to treatment, examination of patient, obtaining history from patient or surrogate, ordering and performing treatments and interventions, ordering and review of laboratory studies, ordering and review of radiographic studies, pulse oximetry, re-evaluation of patient's condition and participation in multidisciplinary rounds.  Lynnell Catalan, MD [facility] ICU Physician [facility] [place] Critical [facility]  Pager: [phone] Mobile: [phone] After hours: [phone].

## 2020-09-09 NOTE — Progress Notes (Signed)
Assisted tele visit to patient with daughter.  Peja Allender Harold, RN  

## 2020-09-09 NOTE — Procedures (Signed)
Extracorporeal support note   ECLS support day: 1 Indication: COVD ARDS  Configuration: VV, RIJ 32Fr [place]  Pump speed: 3400 Pump flow: 4.5 Pump used: Cardiohelp  Sweep gas: 100%,4LPM  Circuit check: no clots Anticoagulant: bivalirudin Anticoagulation targets: PTT 50-70  Changes in support: Continue current support.  Will attempt extubation once more awake.  Anticipated goals/duration of support: bridge to recovery  Lynnell Catalan, MD [facility] ICU Physician [facility] [place] Critical Care  Pager: [phone] Or Epic Secure Chat After hours: [phone].  09/09/2020, 10:21 AM

## 2020-09-09 NOTE — Progress Notes (Addendum)
Palliative Medicine Inpatient Follow Up Note  Reason for consult:  "ECMO"  HPI:  Per intake H&P --> Donald Rowe a 59 y.o.malewith a pertinent history ofhypothyroidism on levothyroxine and hyperlipidemia who was diagnosed with Covid 1 week ago and is not vaccinated. With EMS he was seen to be desaturating to 78% on room air. He states he started having symptoms on Friday and tested positive on Saturday.  Had initially elected to be DNAR without intubation though later changed this and got intubated on 12/6  in the setting of worsening respiratory failure with hypoxia. CTA chest w/ diffuse bilateral infiltrates. On 12/8 was cannulated for VV ECMO. Had been extubated on 12/9 then emergently reintubated in early evening due to clinical instability.   Palliative care was asked to get involved in the setting of VV ECMO to set goals and expectations with Donald Rowe's family.  Today's Discussion (09/09/2020): Attended ECMO rounds this morning.  Medical and cardiology teams made some medication adjustments.  Patient being weaned off sedatives to better get an impression of his altered mental state.  More likely thought to be a complicated delirium picture.  I was able to speak to patient's wife Donald Rowe and his daughter Donald Rowe this morning.  I was able to provide them a comprehensive update as per our rounds this morning.  We discussed delirium and how it can be multifactorial.  We discussed how delirium can be extremely complicated in the setting of acute illness, polypharmacy, and intensive care unit stays.  I shared with him that Dr. Denese Killings would be reaching out to provide a more formal medical update later in the day.  I explained to Donald Rowe the use of ECMO and again emphasized that it is our last line of defense in terms of life support.  Shared that this is often a long journey which can be weeks to months.  She shares that she does understand this and is hopeful that Donald Rowe will recover.   We discussed that he will be off of isolation precautions on Christmas Day which they were so immensely glad about.  Encouraged Donald Rowe and Donald Rowe to utilize E-link to [employer] and better understand the various devices he is on.  Also shared that the ECMO specialist are exceptionally knowledgeable and to utilize them as a resource for additional information.  Discussed the importance of continued conversation with family and their  medical providers regarding overall plan of care and treatment options, ensuring decisions are within the context of the patients values and GOCs.  Questions and concerns addressed   Objective Assessment: Vital Signs Vitals:   09/09/20 0715 09/09/20 0730  BP:    Pulse: 88 87  Resp: 19 19  Temp: 98.24 F (36.8 C) 98.24 F (36.8 C)  SpO2: 92% 92%    Intake/Output Summary (Last 24 hours) at 09/09/2020 0901 Last data filed at 09/09/2020 0700 Gross per 24 hour  Intake 5335.84 ml  Output 3290 ml  Net 2045.84 ml   Last Weight  Most recent update: 09/09/2020  5:22 AM   Weight  115.9 kg (255 lb 8.2 oz)           Gen:  Middle age caucasian M intubated and sedated HEENT: dry mucous membranes CV: Regular rate and rhythm  PULM: Intubated Neuro: Sedated  SUMMARY OF RECOMMENDATIONS Full Code Full Scope of Care  ECMO as a bridge to recovery  Spiritual support - Patient is a Education officer, 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 [employer] or palliative care chaplain plans on reaching out to  Donald Rowe's family on Monday to offer additional support  Provid provided family a daily update  Patient will be off of isolation precautions on Christmas Day  Ongoing PMT support - Plan to attend daily rounds with the medical teams  Time Spent: 25 Greater than 50% of the time was spent in counseling and coordination of care ______________________________________________________________________________________ Donald Rowe [facility] Palliative Medicine Team Team Cell Phone:  [phone] Please utilize secure chat with additional questions, if there is no response within 30 minutes please call the above phone number  Palliative Medicine Team providers are available by phone from 7am to 7pm daily and can be reached through the team cell phone.  Should this patient require assistance outside of these hours, please call the patient's attending physician.

## 2020-09-09 NOTE — Progress Notes (Signed)
ANTICOAGULATION CONSULT NOTE  Pharmacy Consult for bivalirudin Indication: ECMO  No Known Allergies  Patient Measurements: Height: 5\' 9"  (175.3 cm) Weight: 115.9 kg (255 lb 8.2 oz) IBW/kg (Calculated) : 70.7  Vital Signs: Temp: 98.24 F (36.8 C) (12/11 1845) Temp Source: Bladder (12/11 1600) Pulse Rate: 89 (12/11 1845)  Labs: Recent Labs    09/07/20 0438 09/07/20 0620 09/08/20 0343 09/08/20 0351 09/08/20 1639 09/08/20 1651 09/09/20 0458 09/09/20 0501 09/09/20 1320 09/09/20 1659 09/09/20 1701 09/09/20 1945 09/09/20 2000  HGB 10.4*  9.5*   < > 9.7*   < > 10.3*   < > 9.3*   < >  --  9.1* 8.8*  --  8.5*  HCT 31.3*  28.0*   < > 30.5*   < > 30.5*   < > 29.3*   < >  --  28.1* 26.0*  --  25.0*  PLT 141*   < > 144*  --  149*  --  139*  --   --  143*  --   --   --   APTT 42*   < > 52*   < > 52*   < > 60*  --  59* 61*  --  60*  --   LABPROT 17.3*  --  20.5*  --   --   --  24.5*  --   --   --   --   --   --   INR 1.5*  --  1.8*  --   --   --  2.3*  --   --   --   --   --   --   HEPARINUNFRC <0.10*  --   --   --   --   --   --   --   --   --   --   --   --   CREATININE 1.22   < > 1.04  --  0.97  --  0.94  --   --  0.81  --   --   --    < > = values in this interval not displayed.    Estimated Creatinine Clearance: 123.3 mL/min (by C-G formula based on SCr of 0.81 mg/dL).   Assessment: 13 yom unvaccinated presenting with severe COVID ARDs, intubated on 12/6, continued to have ventilation issues despite NMB and proning - started on VV ECMO on 12/8.  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   APTT 60sec (therapeutic-low end) on bivalirudin@0 .3 mg/kg/hr. Hgb 8.5, PLT 148  Goal of Therapy:  aPTT 60-70 seconds Monitor platelets by anticoagulation protocol: Yes   Plan:  Increase bivalirudin to 0.33 mg/kg/hr (dosing wt 103.6 kg) Check aPTT at 5am and 5pm  14/8, PharmD Clinical Pharmacist **Pharmacist phone directory can now be found on [email or web address] ([email or web address]).   Listed under [facility].

## 2020-09-09 NOTE — Progress Notes (Signed)
Patient remains agitated and has labored breathing despite midazolam IV pushes and increases in fentanyl and precedex gtt.  Verbal orders from Agarwala MD to start propofol.

## 2020-09-09 NOTE — Progress Notes (Addendum)
Patient ID: Donald Rowe, male   DOB: [birth year], 59 y.o.   MRN: [id number]    Advanced Heart Failure Rounding Note   Subjective:    12/8 Cannulated for VV ECMO - [place] 12/9 Extubated/Reintubated for altered mental status  Questionable seizure activity yesterday. Head CT negative. EEG with diffuse encephalopathy.  Propofol now off. On Precedex and fentanyl. Not awake this morning.    Remains on clevidipine for BP control, has been started on metoprolol 25 mg bid.   I/Os +2000 cc, weight up.    Tm 101.1 yesterday evening, he is on cefepime.  CXR with stable bilateral infiltrates.   Remains on insulin gtt.   On Bivalirudin. No bleeding. PTT 60.   ECMO  Speed 3400 Flow 4.5L Sweep 6 dP 22 pVen  -70  ABG: 7.41/36.8/66/92% Hgb 9.3 LDH 369 -> 447 -> 743 Lactic acid 1.4  Vent 40%    Objective:   Weight Range:  Vital Signs:   Temp:  [98.06 F (36.7 C)-101.12 F (38.4 C)] 98.24 F (36.8 C) (12/11 0730) Pulse Rate:  [77-127] 87 (12/11 0730) Resp:  [0-32] 19 (12/11 0730) BP: (145-210)/(47-65) 167/59 (12/11 0457) SpO2:  [92 %-96 %] 92 % (12/11 0730) Arterial Line BP: (131-203)/(48-65) 136/54 (12/11 0730) FiO2 (%):  [40 %] 40 % (12/11 0737) Weight:  [115.9 kg] 115.9 kg (12/11 0500) Last BM Date: 09/08/20  Weight change: Filed Weights   09/07/20 0300 09/08/20 0408 09/09/20 0500  Weight: 102.6 kg 109.9 kg 115.9 kg    Intake/Output:   Intake/Output Summary (Last 24 hours) at 09/09/2020 0752 Last data filed at 09/09/2020 0700 Gross per 24 hour  Intake 5620.99 ml  Output 3615 ml  Net 2005.99 ml     Physical Exam: General: Sedated on vent Neck: ECMO cannula, no JVD Lungs: Decreased bases bilaterally.  CV: Nondisplaced PMI.  Heart regular S1/S2, no S3/S4, no murmur. Trace ankle edema.   Abdomen: Soft, nontender, no hepatosplenomegaly, no distention.  Skin: Intact without lesions or rashes.  Neurologic: Sedated Extremities: No clubbing or  cyanosis.  HEENT: Normal.    Telemetry: sinus 90s Personally reviewed   Labs: Basic Metabolic Panel: Recent Labs  Lab 09/03/20 0143 09/04/20 0255 09/13/2020 0334 08/30/2020 0846 09/07/20 0438 09/07/20 0620 09/07/20 1655 09/07/20 1713 09/08/20 0343 09/08/20 0351 09/08/20 1639 09/08/20 1651 09/08/20 1954 09/09/20 0458 09/09/20 0501  NA 134*   < > 139   < > 144  145   < > 147*   < > 146*   < > 148* 151* 151* 148* 149*  K 3.9   < > 4.9   < > 4.3  4.3   < > 3.9   < > 4.4   < > 3.9 3.9 3.7 4.4 4.3  CL 100   < > 107   < > 112*  --  116*  --  116*  --  117*  --   --  118*  --   CO2 23   < > 25   < > 26  --  24  --  23  --  22  --   --  25  --   GLUCOSE 118*   < > 253*   < > 214*  --  136*  --  231*  --  139*  --   --  231*  --   BUN 15   < > 41*   < > 40*  --  38*  --  40*  --  40*  --   --  40*  --   CREATININE 1.08   < > 1.25*   < > 1.22  --  0.96  --  1.04  --  0.97  --   --  0.94  --   CALCIUM 8.4*   < > 8.4*   < > 7.9*  --  7.8*  --  7.5*  --  7.8*  --   --  7.9*  --   MG 1.8  --  2.9*  --  2.5*  --   --   --  2.8*  --   --   --   --  2.7*  --   PHOS 1.4*  --  4.3  --  2.0*  --   --   --  1.8*  --   --   --   --  2.1*  --    < > = values in this interval not displayed.    Liver Function Tests: Recent Labs  Lab 09/28/2020 0334 09/05/2020 1750 09/07/20 0438 09/08/20 0343 09/09/20 0458  AST 38 32 28 42* 73*  ALT 41 33 28 34 60*  ALKPHOS 107 87 71 67 79  BILITOT 0.7 0.9 0.8 1.2 1.9*  PROT 5.0* 4.4* 4.5* 4.3* 4.5*  ALBUMIN 2.3* 2.0* 2.6* 2.3* 2.3*   No results for input(s): LIPASE, AMYLASE in the last 168 hours. No results for input(s): AMMONIA in the last 168 hours.  CBC: Recent Labs  Lab 09/03/20 0143 09/04/20 0255 09/05/20 0258 09/10/2020 0334 09/12/2020 0846 09/07/20 0438 09/07/20 0620 09/07/20 1655 09/07/20 1713 09/08/20 0343 09/08/20 0351 09/08/20 1639 09/08/20 1651 09/08/20 1954 09/09/20 0458 09/09/20 0501  WBC 11.6* 17.6* 17.4* 14.5*   < > 9.0  --  9.9   --  11.0*  --  11.3*  --   --  10.6*  --   NEUTROABS 9.7* 14.6* 15.0* 12.0*  --  7.7  --   --   --   --   --   --   --   --   --   --   HGB 14.8 14.2 13.5 13.8   < > 10.4*  9.5*   < > 10.5*   < > 9.7*   < > 10.3* 9.9* 8.8* 9.3* 8.5*  HCT 43.9 42.3 40.8 43.9   < > 31.3*  28.0*   < > 32.6*   < > 30.5*   < > 30.5* 29.0* 26.0* 29.3* 25.0*  MCV 91.8 91.8 92.5 97.6   < > 94.8  --  94.8  --  95.6  --  93.8  --   --  95.1  --   PLT 150 150 152 173   < > 141*  --  146*  --  144*  --  149*  --   --  139*  --    < > = values in this interval not displayed.    Cardiac Enzymes: No results for input(s): CKTOTAL, CKMB, CKMBINDEX, TROPONINI in the last 168 hours.  BNP: BNP (last 3 results) No results for input(s): BNP in the last 8760 hours.  ProBNP (last 3 results) No results for input(s): PROBNP in the last 8760 hours.    Other results:  Imaging: EEG  Result Date: 09/07/2020 Lora Havens, MD     09/07/2020  5:39 PM Patient Name: Koven Belinsky MRN: [id number] Epilepsy Attending: Lora Havens Referring Physician/Provider: Dr Ina Homes Date: 09/07/2020 Duration:  24.54 mins Patient history: 59yo m with COVID, ams. EEG to evaluate for seizure. Level of alertness: comatose AEDs during EEG study: Propofol ( turned off during eeg) Technical aspects: This EEG study was done with scalp electrodes positioned according to the 10-20 International system of electrode placement. Electrical activity was acquired at a sampling rate of $Remov'500Hz'FlUCzC$  and reviewed with a high frequency filter of $RemoveB'70Hz'JBejjXQw$  and a low frequency filter of $RemoveB'1Hz'VEnXPwtz$ . EEG data were recorded continuously and digitally stored. Description: EEG showed discontinuous pattern with  generalized polymorphic 3 to 6 Hz theta-delta slowing as well as brief periods of generalized attenuation lasting 2-4 seconds. Hyperventilation and photic stimulation were not performed.   ABNORMALITY -Continuous slow, generalized IMPRESSION: This study is suggestive of severe  diffuse encephalopathy, nonspecific etiology but likely secondary to sedation. No seizures or definite epileptiform discharges were seen throughout the recording. Lora Havens   DG Chest 1 View  Result Date: 09/07/2020 CLINICAL DATA:  Intubated EXAM: CHEST  1 VIEW COMPARISON:  Chest radiograph from earlier today. FINDINGS: Endotracheal tube tip is 3.2 cm above the carina. Enteric tube enters stomach with the tip not seen on this image. Left internal jugular central venous catheter terminates over the middle third of the SVC. ECMO catheter overlies the medial right chest. Stable cardiomediastinal silhouette with normal heart size. No pneumothorax. No pleural effusion. Low lung volumes with worsened severe diffuse patchy lung opacities with air bronchograms. IMPRESSION: 1. Well-positioned support structures. No pneumothorax. 2. Low lung volumes with worsened severe diffuse patchy lung opacities with air bronchograms, compatible with severe COVID-19 pneumonia. Electronically Signed   By: Ilona Sorrel M.D.   On: 09/07/2020 17:05   CT HEAD WO CONTRAST  Result Date: 09/07/2020 CLINICAL DATA:  59 year old male with seizure like activity status post severe COVID-19. On ECMO EXAM: CT HEAD WITHOUT CONTRAST TECHNIQUE: Contiguous axial images were obtained from the base of the skull through the vertex without intravenous contrast. COMPARISON:  None. FINDINGS: Brain: No midline shift, ventriculomegaly, mass effect, evidence of mass lesion, intracranial hemorrhage or evidence of cortically based acute infarction. Cerebral volume and gray-white matter differentiation within normal limits for age. Vascular: Mild Calcified atherosclerosis at the skull base. No suspicious intracranial vascular hyperdensity. Skull: Negative. Sinuses/Orbits: Left side nasal enteric tube in place. Mucosal thickening and fluid/bubbly opacity throughout the paranasal sinuses, relatively sparing the right maxillary. Mild to moderate bilateral  mastoid effusions. Tympanic cavities relatively spared. Other: Visualized orbits and scalp soft tissues are within normal limits. IMPRESSION: 1. Normal for age non contrast CT appearance of the brain. 2. Paranasal sinus inflammation and mastoid effusions in the setting of intubation. Left side nasal enteric tube in place. Electronically Signed   By: Genevie Ann M.D.   On: 09/07/2020 16:15   DG CHEST PORT 1 VIEW  Result Date: 09/08/2020 CLINICAL DATA:  Intubation.  ECMO.  COVID positive. EXAM: PORTABLE CHEST 1 VIEW COMPARISON:  09/07/2020. FINDINGS: Endotracheal tube, feeding tube, left IJ line, ECMO device in stable position. Stable cardiomegaly. Stable diffuse dense pulmonary infiltrates/edema. No prominent pleural effusion is identified. No pneumothorax. IMPRESSION: 1. Lines and tubes including ECMO device in stable position. 2. Stable diffuse dense bilateral pulmonary infiltrates/edema. No interim change. Electronically Signed   By: [place]   On: 09/08/2020 07:56     Medications:     Scheduled Medications: . sodium chloride   Intravenous Once  . aspirin  81 mg Per Tube Daily  . baricitinib  4 mg Per Tube Daily  .  chlorhexidine gluconate (MEDLINE KIT)  15 mL Mouth Rinse BID  . Chlorhexidine Gluconate Cloth  6 each Topical Daily  . docusate  100 mg Per Tube BID  . feeding supplement (PIVOT 1.5 CAL)  1,000 mL Per Tube Q24H  . feeding supplement (PROSource TF)  45 mL Per Tube BID  . fiber  1 packet Per Tube BID  . free water  300 mL Per Tube Q4H  . furosemide  20 mg Intravenous Once  . hydrALAZINE  25 mg Per Tube Q8H  . insulin aspart  2-6 Units Subcutaneous Q4H  . insulin aspart  8 Units Subcutaneous Q4H  . insulin detemir  24 Units Subcutaneous Q12H  . levothyroxine  50 mcg Per Tube Q0600  . mouth rinse  15 mL Mouth Rinse 10 times per day  . methylPREDNISolone (SOLU-MEDROL) injection  40 mg Intravenous Q12H  . metoprolol tartrate  75 mg Per Tube BID  . oxyCODONE  5 mg Per Tube  Q6H  . pantoprazole sodium  40 mg Per Tube Daily  . potassium & sodium phosphates  1 packet Per Tube TID  . potassium chloride  20 mEq Per Tube Once  . pravastatin  40 mg Per Tube q1800  . QUEtiapine  50 mg Per Tube BID  . sodium chloride flush  10-40 mL Intracatheter Q12H    Infusions: . albumin human 12.5 g (09/07/20 1449)  . bivalirudin (ANGIOMAX) infusion 0.5 mg/mL (Non-ACS indications)    . ceFEPime (MAXIPIME) IV Stopped (09/08/20 2351)  . clevidipine 6 mg/hr (09/09/20 0700)  . dexmedetomidine (PRECEDEX) IV infusion 0.8 mcg/kg/hr (09/09/20 0700)  . dextrose    . fentaNYL infusion INTRAVENOUS 100 mcg/hr (09/09/20 0700)  . norepinephrine (LEVOPHED) Adult infusion Stopped (09/07/20 0730)  . propofol (DIPRIVAN) infusion Stopped (09/08/20 0711)    PRN Medications: acetaminophen (TYLENOL) oral liquid 160 mg/5 mL, albumin human, dextrose, fentaNYL, ibuprofen, labetalol, midazolam, midazolam, ondansetron **OR** ondansetron (ZOFRAN) IV, polyethylene glycol, sodium chloride flush   Assessment/Plan:   1. Acute hypoxic respiratory failure/ARDS in setting of COVID-19 PNA - Cannulated for VV ECMO on 12/8 after failing intubation x 2 days - has complete remedisivir, baricitinib - continue methylprednisolone and abx (cefepime) - stop dates in place - ECMO circuit looks good.  - CXR diffuse infiltrates, stable. Cannula in good position  - Work on sedation weaning today.  - Bivalirudin, PTT goal 60-70. Discussed dosing with PharmD personally. - Weight rising, will give Lasix 20 mg IV x 1 today.   2. Bilateral DVT - continue bival as above  3. DM2 - on insulin gtt. adjusted  4. Obesity -Body mass index is 33.73 kg/m.  5. F/E/N - sodium climbing - Free water  6. HTN - Continue metoprolol 25 mg bid.  - Add hydralazine 25 mg q8 hrs - Wean clevidipine.    Plan discussed on multi-discplinary ECMO rounds with CCM, ECMO coordinator/specialist, PharmDs and RNs.  CRITICAL  CARE Performed by: Loralie Champagne   Total critical care time: 40 minutes  Critical care time was exclusive of separately billable procedures and treating other patients.  Critical care was necessary to treat or prevent imminent or life-threatening deterioration.  Critical care was time spent personally by me (independent of midlevel providers or residents) on the following activities: development of treatment plan with patient and/or surrogate as well as nursing, discussions with consultants, evaluation of patient's response to treatment, examination of patient, obtaining history from patient or surrogate, ordering and performing treatments and interventions, ordering and review of  laboratory studies, ordering and review of radiographic studies, pulse oximetry and re-evaluation of patient's condition.    Length of Stay: 7   Loralie Champagne MD 09/09/2020, 7:52 AM  Advanced Heart Failure Team Pager [phone] (M-F; 7a - 4p)  Please contact [facility] for night-coverage after hours (4p -7a ) and weekends on [email or web address]

## 2020-09-10 ENCOUNTER — Inpatient Hospital Stay (HOSPITAL_COMMUNITY): Payer: HRSA Program

## 2020-09-10 DIAGNOSIS — Z515 Encounter for palliative care: Secondary | ICD-10-CM | POA: Diagnosis not present

## 2020-09-10 DIAGNOSIS — J9601 Acute respiratory failure with hypoxia: Secondary | ICD-10-CM | POA: Diagnosis not present

## 2020-09-10 DIAGNOSIS — U071 COVID-19: Secondary | ICD-10-CM | POA: Diagnosis not present

## 2020-09-10 LAB — POCT I-STAT 7, (LYTES, BLD GAS, ICA,H+H)
Acid-Base Excess: 1 mmol/L (ref 0.0–2.0)
Acid-Base Excess: 2 mmol/L (ref 0.0–2.0)
Acid-Base Excess: 3 mmol/L — ABNORMAL HIGH (ref 0.0–2.0)
Acid-Base Excess: 6 mmol/L — ABNORMAL HIGH (ref 0.0–2.0)
Acid-Base Excess: 7 mmol/L — ABNORMAL HIGH (ref 0.0–2.0)
Acid-Base Excess: 4 mmol/L — ABNORMAL HIGH (ref 0.0–2.0)
Acid-Base Excess: 4 mmol/L — ABNORMAL HIGH (ref 0.0–2.0)
Acid-Base Excess: 4 mmol/L — ABNORMAL HIGH (ref 0.0–2.0)
Acid-Base Excess: 7 mmol/L — ABNORMAL HIGH (ref 0.0–2.0)
Bicarbonate: 27.5 mmol/L (ref 20.0–28.0)
Bicarbonate: 27 mmol/L (ref 20.0–28.0)
Bicarbonate: 28.2 mmol/L — ABNORMAL HIGH (ref 20.0–28.0)
Bicarbonate: 30.9 mmol/L — ABNORMAL HIGH (ref 20.0–28.0)
Bicarbonate: 32.5 mmol/L — ABNORMAL HIGH (ref 20.0–28.0)
Bicarbonate: 29.3 mmol/L — ABNORMAL HIGH (ref 20.0–28.0)
Bicarbonate: 29.5 mmol/L — ABNORMAL HIGH (ref 20.0–28.0)
Bicarbonate: 29.4 mmol/L — ABNORMAL HIGH (ref 20.0–28.0)
Bicarbonate: 30.7 mmol/L — ABNORMAL HIGH (ref 20.0–28.0)
Calcium, Ion: 1.28 mmol/L (ref 1.15–1.40)
Calcium, Ion: 1.27 mmol/L (ref 1.15–1.40)
Calcium, Ion: 1.24 mmol/L (ref 1.15–1.40)
Calcium, Ion: 1.25 mmol/L (ref 1.15–1.40)
Calcium, Ion: 1.28 mmol/L (ref 1.15–1.40)
Calcium, Ion: 1.19 mmol/L (ref 1.15–1.40)
Calcium, Ion: 1.21 mmol/L (ref 1.15–1.40)
Calcium, Ion: 1.25 mmol/L (ref 1.15–1.40)
Calcium, Ion: 1.22 mmol/L (ref 1.15–1.40)
HCT: 23 % — ABNORMAL LOW (ref 39.0–52.0)
HCT: 23 % — ABNORMAL LOW (ref 39.0–52.0)
HCT: 23 % — ABNORMAL LOW (ref 39.0–52.0)
HCT: 23 % — ABNORMAL LOW (ref 39.0–52.0)
HCT: 23 % — ABNORMAL LOW (ref 39.0–52.0)
HCT: 25 % — ABNORMAL LOW (ref 39.0–52.0)
HCT: 25 % — ABNORMAL LOW (ref 39.0–52.0)
HCT: 23 % — ABNORMAL LOW (ref 39.0–52.0)
HCT: 26 % — ABNORMAL LOW (ref 39.0–52.0)
Hemoglobin: 7.8 g/dL — ABNORMAL LOW (ref 13.0–17.0)
Hemoglobin: 7.8 g/dL — ABNORMAL LOW (ref 13.0–17.0)
Hemoglobin: 7.8 g/dL — ABNORMAL LOW (ref 13.0–17.0)
Hemoglobin: 7.8 g/dL — ABNORMAL LOW (ref 13.0–17.0)
Hemoglobin: 7.8 g/dL — ABNORMAL LOW (ref 13.0–17.0)
Hemoglobin: 8.5 g/dL — ABNORMAL LOW (ref 13.0–17.0)
Hemoglobin: 8.5 g/dL — ABNORMAL LOW (ref 13.0–17.0)
Hemoglobin: 7.8 g/dL — ABNORMAL LOW (ref 13.0–17.0)
Hemoglobin: 8.8 g/dL — ABNORMAL LOW (ref 13.0–17.0)
O2 Saturation: 82 %
O2 Saturation: 89 %
O2 Saturation: 100 %
O2 Saturation: 56 %
O2 Saturation: 87 %
O2 Saturation: 95 %
O2 Saturation: 96 %
O2 Saturation: 91 %
O2 Saturation: 86 %
Patient temperature: 36.7
Patient temperature: 36.9
Patient temperature: 37
Patient temperature: 37
Patient temperature: 37
Patient temperature: 36.5
Patient temperature: 36.5
Patient temperature: 36.7
Patient temperature: 36.9
Potassium: 3.9 mmol/L (ref 3.5–5.1)
Potassium: 3.7 mmol/L (ref 3.5–5.1)
Potassium: 3.3 mmol/L — ABNORMAL LOW (ref 3.5–5.1)
Potassium: 3.4 mmol/L — ABNORMAL LOW (ref 3.5–5.1)
Potassium: 3.6 mmol/L (ref 3.5–5.1)
Potassium: 4.4 mmol/L (ref 3.5–5.1)
Potassium: 4.6 mmol/L (ref 3.5–5.1)
Potassium: 3.9 mmol/L (ref 3.5–5.1)
Potassium: 3.5 mmol/L (ref 3.5–5.1)
Sodium: 151 mmol/L — ABNORMAL HIGH (ref 135–145)
Sodium: 150 mmol/L — ABNORMAL HIGH (ref 135–145)
Sodium: 149 mmol/L — ABNORMAL HIGH (ref 135–145)
Sodium: 149 mmol/L — ABNORMAL HIGH (ref 135–145)
Sodium: 150 mmol/L — ABNORMAL HIGH (ref 135–145)
Sodium: 148 mmol/L — ABNORMAL HIGH (ref 135–145)
Sodium: 149 mmol/L — ABNORMAL HIGH (ref 135–145)
Sodium: 147 mmol/L — ABNORMAL HIGH (ref 135–145)
Sodium: 148 mmol/L — ABNORMAL HIGH (ref 135–145)
TCO2: 29 mmol/L (ref 22–32)
TCO2: 28 mmol/L (ref 22–32)
TCO2: 30 mmol/L (ref 22–32)
TCO2: 32 mmol/L (ref 22–32)
TCO2: 34 mmol/L — ABNORMAL HIGH (ref 22–32)
TCO2: 31 mmol/L (ref 22–32)
TCO2: 31 mmol/L (ref 22–32)
TCO2: 31 mmol/L (ref 22–32)
TCO2: 32 mmol/L (ref 22–32)
pCO2 arterial: 55.8 mmHg — ABNORMAL HIGH (ref 32.0–48.0)
pCO2 arterial: 44.3 mmHg (ref 32.0–48.0)
pCO2 arterial: 47.1 mmHg (ref 32.0–48.0)
pCO2 arterial: 49.3 mmHg — ABNORMAL HIGH (ref 32.0–48.0)
pCO2 arterial: 54.4 mmHg — ABNORMAL HIGH (ref 32.0–48.0)
pCO2 arterial: 45.4 mmHg (ref 32.0–48.0)
pCO2 arterial: 47 mmHg (ref 32.0–48.0)
pCO2 arterial: 45.3 mmHg (ref 32.0–48.0)
pCO2 arterial: 40.6 mmHg (ref 32.0–48.0)
pH, Arterial: 7.299 — ABNORMAL LOW (ref 7.350–7.450)
pH, Arterial: 7.392 (ref 7.350–7.450)
pH, Arterial: 7.384 (ref 7.350–7.450)
pH, Arterial: 7.385 (ref 7.350–7.450)
pH, Arterial: 7.406 (ref 7.350–7.450)
pH, Arterial: 7.4 (ref 7.350–7.450)
pH, Arterial: 7.419 (ref 7.350–7.450)
pH, Arterial: 7.419 (ref 7.350–7.450)
pH, Arterial: 7.486 — ABNORMAL HIGH (ref 7.350–7.450)
pO2, Arterial: 51 mmHg — ABNORMAL LOW (ref 83.0–108.0)
pO2, Arterial: 58 mmHg — ABNORMAL LOW (ref 83.0–108.0)
pO2, Arterial: 30 mmHg — CL (ref 83.0–108.0)
pO2, Arterial: 477 mmHg — ABNORMAL HIGH (ref 83.0–108.0)
pO2, Arterial: 55 mmHg — ABNORMAL LOW (ref 83.0–108.0)
pO2, Arterial: 77 mmHg — ABNORMAL LOW (ref 83.0–108.0)
pO2, Arterial: 79 mmHg — ABNORMAL LOW (ref 83.0–108.0)
pO2, Arterial: 61 mmHg — ABNORMAL LOW (ref 83.0–108.0)
pO2, Arterial: 47 mmHg — ABNORMAL LOW (ref 83.0–108.0)

## 2020-09-10 LAB — CBC
HCT: 26.8 % — ABNORMAL LOW (ref 39.0–52.0)
HCT: 27.6 % — ABNORMAL LOW (ref 39.0–52.0)
Hemoglobin: 8.7 g/dL — ABNORMAL LOW (ref 13.0–17.0)
Hemoglobin: 8.8 g/dL — ABNORMAL LOW (ref 13.0–17.0)
MCH: 31.2 pg (ref 26.0–34.0)
MCH: 30.4 pg (ref 26.0–34.0)
MCHC: 32.5 g/dL (ref 30.0–36.0)
MCHC: 31.9 g/dL (ref 30.0–36.0)
MCV: 96.1 fL (ref 80.0–100.0)
MCV: 95.5 fL (ref 80.0–100.0)
Platelets: 132 10*3/uL — ABNORMAL LOW (ref 150–400)
Platelets: 134 10*3/uL — ABNORMAL LOW (ref 150–400)
RBC: 2.79 MIL/uL — ABNORMAL LOW (ref 4.22–5.81)
RBC: 2.89 MIL/uL — ABNORMAL LOW (ref 4.22–5.81)
RDW: 14.2 % (ref 11.5–15.5)
RDW: 14.2 % (ref 11.5–15.5)
WBC: 10.1 10*3/uL (ref 4.0–10.5)
WBC: 11.1 10*3/uL — ABNORMAL HIGH (ref 4.0–10.5)
nRBC: 0 % (ref 0.0–0.2)
nRBC: 0 % (ref 0.0–0.2)

## 2020-09-10 LAB — BASIC METABOLIC PANEL
Anion gap: 6 (ref 5–15)
Anion gap: 7 (ref 5–15)
BUN: 41 mg/dL — ABNORMAL HIGH (ref 6–20)
BUN: 42 mg/dL — ABNORMAL HIGH (ref 6–20)
CO2: 27 mmol/L (ref 22–32)
CO2: 28 mmol/L (ref 22–32)
Calcium: 7.8 mg/dL — ABNORMAL LOW (ref 8.9–10.3)
Calcium: 8 mg/dL — ABNORMAL LOW (ref 8.9–10.3)
Chloride: 116 mmol/L — ABNORMAL HIGH (ref 98–111)
Chloride: 111 mmol/L (ref 98–111)
Creatinine, Ser: 0.9 mg/dL (ref 0.61–1.24)
Creatinine, Ser: 0.97 mg/dL (ref 0.61–1.24)
GFR, Estimated: >60 mL/min (ref >60)
GFR, Estimated: >60 mL/min (ref >60)
Glucose, Bld: 146 mg/dL — ABNORMAL HIGH (ref 70–99)
Glucose, Bld: 151 mg/dL — ABNORMAL HIGH (ref 70–99)
Potassium: 4 mmol/L (ref 3.5–5.1)
Potassium: 3.9 mmol/L (ref 3.5–5.1)
Sodium: 149 mmol/L — ABNORMAL HIGH (ref 135–145)
Sodium: 146 mmol/L — ABNORMAL HIGH (ref 135–145)

## 2020-09-10 LAB — GLUCOSE, CAPILLARY
Glucose-Capillary: 142 mg/dL — ABNORMAL HIGH (ref 70–99)
Glucose-Capillary: 122 mg/dL — ABNORMAL HIGH (ref 70–99)
Glucose-Capillary: 140 mg/dL — ABNORMAL HIGH (ref 70–99)
Glucose-Capillary: 126 mg/dL — ABNORMAL HIGH (ref 70–99)

## 2020-09-10 LAB — HEPATIC FUNCTION PANEL
ALT: 61 U/L — ABNORMAL HIGH (ref 0–44)
AST: 70 U/L — ABNORMAL HIGH (ref 15–41)
Albumin: 2 g/dL — ABNORMAL LOW (ref 3.5–5.0)
Alkaline Phosphatase: 102 U/L (ref 38–126)
Bilirubin, Direct: 1.2 mg/dL — ABNORMAL HIGH (ref 0.0–0.2)
Indirect Bilirubin: 0.8 mg/dL (ref 0.3–0.9)
Total Bilirubin: 2 mg/dL — ABNORMAL HIGH (ref 0.3–1.2)
Total Protein: 4.3 g/dL — ABNORMAL LOW (ref 6.5–8.1)

## 2020-09-10 LAB — FIBRINOGEN: Fibrinogen: 408 mg/dL (ref 210–475)

## 2020-09-10 LAB — LACTATE DEHYDROGENASE: LDH: 1081 U/L — ABNORMAL HIGH (ref 98–192)

## 2020-09-10 LAB — APTT
aPTT: 62 seconds — ABNORMAL HIGH (ref 24–36)
aPTT: 77 seconds — ABNORMAL HIGH (ref 24–36)

## 2020-09-10 LAB — PROTIME-INR
INR: 2.8 — ABNORMAL HIGH (ref 0.8–1.2)
Prothrombin Time: 28.5 seconds — ABNORMAL HIGH (ref 11.4–15.2)

## 2020-09-10 LAB — LACTIC ACID, PLASMA: Lactic Acid, Venous: 1.3 mmol/L (ref 0.5–1.9)

## 2020-09-10 LAB — TRIGLYCERIDES: Triglycerides: 139 mg/dL (ref <150)

## 2020-09-10 MED ORDER — POTASSIUM CHLORIDE 20 MEQ PO PACK
20.0000 meq | PACK | Freq: Two times a day (BID) | ORAL | Status: DC
  Administered 2020-09-10 – 2020-09-12 (×5): 20 meq
  Filled 2020-09-10 (×5): qty 1

## 2020-09-10 MED ORDER — FUROSEMIDE 10 MG/ML IJ SOLN
20.0000 mg | Freq: Once | INTRAMUSCULAR | Status: AC
  Administered 2020-09-10: 02:00:00 20 mg via INTRAVENOUS
  Filled 2020-09-10: qty 2

## 2020-09-10 MED ORDER — FUROSEMIDE 10 MG/ML IJ SOLN: 20.0000 mg | Freq: Once | INTRAMUSCULAR | Status: DC

## 2020-09-10 MED ORDER — NOREPINEPHRINE 16 MG/250ML-% IV SOLN
0.0000 ug/min | INTRAVENOUS | Status: DC
  Administered 2020-09-10: 09:00:00 5 ug/min via INTRAVENOUS
  Administered 2020-09-11: 23:00:00 0 ug/min via INTRAVENOUS
  Administered 2020-09-17: 23:00:00 5 ug/min via INTRAVENOUS
  Filled 2020-09-10 (×3): qty 250

## 2020-09-10 MED ORDER — FUROSEMIDE 10 MG/ML IJ SOLN
40.0000 mg | Freq: Two times a day (BID) | INTRAMUSCULAR | Status: AC
  Administered 2020-09-10 – 2020-09-11 (×4): 40 mg via INTRAVENOUS
  Filled 2020-09-10 (×4): qty 4

## 2020-09-10 MED ORDER — FUROSEMIDE 10 MG/ML IJ SOLN
40.0000 mg | Freq: Once | INTRAMUSCULAR | Status: AC
  Administered 2020-09-10: 04:00:00 40 mg via INTRAVENOUS
  Filled 2020-09-10: qty 4

## 2020-09-10 NOTE — Progress Notes (Signed)
Assisted tele visit to patient with family member.  Sidda Humm Harold, RN  

## 2020-09-10 NOTE — Progress Notes (Signed)
Critical care-ECMO-event note  Called to bedside by nursing and ECMO specialist because the patient was having increased work of breathing/ventilator dyssynchrony in addition to hypoxia.  On arrival patient was dyssynchronous with the ventilator despite being on propofol.   ECMO 4.46 L 3400 RPM Sweep of 3 Delta P unchanged No change in positioning of cannulas  Ventilator FiO2 been increased to 50%.  Chest x-ray was obtained. Continues show hilar congestion unchanged from earlier on 12/11.  Plan: Increase sedation propofol 50 mics per minute. Lasix 20 mg IV x1 now-patient's 3.1 L positive over the past 24 hours. We'll attempt pressure support ventilation with a high pressure support see if we can minimize the dyssynchrony. ECMO circuit appears to be working properly. Oxygenation is improving. We'll continue to follow.

## 2020-09-10 NOTE — Progress Notes (Signed)
Assisted tele visit to patient with daughter.  Jaxsun Ciampi Ann, RN  

## 2020-09-10 NOTE — Progress Notes (Signed)
ANTICOAGULATION CONSULT NOTE  Pharmacy Consult for bivalirudin Indication: ECMO  No Known Allergies  Patient Measurements: Height: 5\' 9"  (175.3 cm) Weight: 114.7 kg (252 lb 13.9 oz) IBW/kg (Calculated) : 70.7  Vital Signs: Temp: 97.88 F (36.6 C) (12/12 1100) Temp Source: Bladder (12/12 0800) Pulse Rate: 122 (12/12 1830)  Labs: Recent Labs    09/08/20 0343 09/08/20 0351 09/09/20 0458 09/09/20 0501 09/09/20 1659 09/09/20 1701 09/09/20 1945 09/09/20 2000 09/10/20 0344 09/10/20 0346 09/10/20 1219 09/10/20 1601 09/10/20 1738  HGB 9.7*   < > 9.3*   < > 9.1*   < >  --    < > 8.7*   < > 8.5* 7.8* 8.8*  HCT 30.5*   < > 29.3*   < > 28.1*   < >  --    < > 26.8*   < > 25.0* 23.0* 27.6*  PLT 144*   < > 139*  --  143*  --   --   --  132*  --   --   --  134*  APTT 52*   < > 60*   < > 61*  --  60*  --  62*  --   --   --  77*  LABPROT 20.5*  --  24.5*  --   --   --   --   --  28.5*  --   --   --   --   INR 1.8*  --  2.3*  --   --   --   --   --  2.8*  --   --   --   --   CREATININE 1.04   < > 0.94  --  0.81  --   --   --  0.90  --   --   --  0.97   < > = values in this interval not displayed.    Estimated Creatinine Clearance: 102.4 mL/min (by C-G formula based on SCr of 0.97 mg/dL).   Assessment: 34 yom unvaccinated presenting with severe COVID ARDs, intubated on 12/6, continued to have ventilation issues despite NMB and proning - started on VV ECMO on 12/8.  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   APTT 77sec on bivalirudin@0 .38 mg/kg/hr. Hgb 8.8, PLT 134.    Goal of Therapy:  aPTT 60-70 seconds Monitor platelets by anticoagulation protocol: Yes   Plan:  Decrease bivalirudin to 0.34 mg/kg/hr (dosing wt 103.6 kg) APTT in 6 hours  14/8, PharmD Clinical Pharmacist **Pharmacist phone directory can now be found on [email or web address] ([email or web address]).  Listed under [facility].

## 2020-09-10 NOTE — Progress Notes (Addendum)
Palliative Medicine Inpatient Follow Up Note  Reason for consult:  "ECMO"  HPI:  Per intake H&P --> Donald Rowe a 59 y.o.malewith a pertinent history ofhypothyroidism on levothyroxine and hyperlipidemia who was diagnosed with Covid 1 week ago and is not vaccinated. With EMS he was seen to be desaturating to 78% on room air. He states he started having symptoms on Friday and tested positive on Saturday.  Had initially elected to be DNAR without intubation though later changed this and got intubated on 12/6  in the setting of worsening respiratory failure with hypoxia. CTA chest w/ diffuse bilateral infiltrates. On 12/8 was cannulated for VV ECMO. Had been extubated on 12/9 then emergently reintubated in early evening due to clinical instability.   Palliative care was asked to get involved in the setting of VV ECMO to set goals and expectations with [name]'s family.  Today's Discussion (09/10/2020): Attended ECMO rounds this morning.  Medical and cardiology teams made some medication adjustments in the setting of volume overload and hypotension overnight. (+) new hemoptysis bivalirudin placed on hold (inc. LDH) w/ possible need for bronchoscopy if no resolution. Continued AMS/delirium when weaned down on sedatives gets more agitated.  Patient's wife Donald Rowe called this morning and provided an update.  She had expressed some concerns as her stepdaughter Donald Rowe was worried that Donald Rowe was planning to be discharged soon.  I was able to take the time to educate her that there is no way we would be able to discharge Donald Rowe as he is receiving a very specialized level of medical care which must be done in the acute care, intensive unit setting.  I shared that even if decannulated he still would have a long road ahead before he would be able to go to a rehabilitation.  I emphasized with Donald Rowe that at this juncture we do not know how things are going to play out - of course we are hoping for the  best but patients such as Donald Rowe are again incredibly ill and many things could go right as well as wrong.  Donald Rowe was very understanding to this and thankful for the clarification.  I shared with Donald Rowe that I am not back tomorrow though I will request one of my team members follows along to continue providing her daily updates.  Discussed the importance of continued conversation with family and their  medical providers regarding overall plan of care and treatment options, ensuring decisions are within the context of the patients values and GOCs.  Questions and concerns addressed   Objective Assessment: Vital Signs Vitals:   09/10/20 0815 09/10/20 0830  BP:    Pulse: (!) 102 98  Resp: (!) 34 (!) 23  Temp: 98.6 F (37 C) 98.6 F (37 C)  SpO2: 91% 92%    Intake/Output Summary (Last 24 hours) at 09/10/2020 6144 Last data filed at 09/10/2020 3154 Gross per 24 hour  Intake 6695.2 ml  Output 6195 ml  Net 500.2 ml   Last Weight  Most recent update: 09/10/2020  6:18 AM   Weight  114.7 kg (252 lb 13.9 oz)           Gen:  Middle age caucasian M intubated and sedated HEENT: dry mucous membranes CV: Regular rate and rhythm  PULM: Intubated Neuro: Sedated  SUMMARY OF RECOMMENDATIONS Full Code/Full Scope of Care  ECMO as a bridge to recovery  Spiritual support appreciated  Provided patients wife, Donald Rowe a daily update  Patient will be off of isolation precautions on  Christmas Day  Ongoing PMT support - Plan to attend daily rounds with the medical teams  Time Spent: 25 Greater than 50% of the time was spent in counseling and coordination of care ______________________________________________________________________________________ [name] [facility] Palliative Medicine Team Team Cell Phone: [phone] Please utilize secure chat with additional questions, if there is no response within 30 minutes please call the above phone number  Palliative Medicine Team  providers are available by phone from 7am to 7pm daily and can be reached through the team cell phone.  Should this patient require assistance outside of these hours, please call the patient's attending physician.

## 2020-09-10 NOTE — Procedures (Signed)
Extracorporeal support note   ECLS support day: 1 Indication: COVD ARDS  Configuration: VV, RIJ 32Fr [place]  Pump speed: 3860 Pump flow: 4.96 Pump used: Cardiohelp  Sweep gas: 100%,5LPM  Circuit check: no clots Anticoagulant: bivalirudin Anticoagulation targets: PTT 50-70 -6-hour hold for bloody endotracheal secretions  Changes in support: Continue current support.  Does not appear he will be able to tolerate awake ECMO due to delirium.  Initiate prone positioning.  Anticipated goals/duration of support: bridge to recovery  Lynnell Catalan, MD [facility] ICU Physician [facility]  Pager: [phone] Or Epic Secure Chat After hours: [phone].  09/10/2020, 5:18 PM

## 2020-09-10 NOTE — Progress Notes (Signed)
OT Cancellation Note  Patient Details Name: Donald Rowe MRN: [id number] DOB: 06-22-1961   Cancelled Treatment:    Reason Eval/Treat Not Completed: Patient not medically ready.  Will check back.  Eber Jones., OTR/L Acute Rehabilitation Services Pager [phone] Office [phone]   Jeani Hawking M 09/10/2020, 8:46 AM

## 2020-09-10 NOTE — Progress Notes (Signed)
eLink Physician-Brief Progress Note Patient Name: Donald Rowe DOB: [birth year] MRN: [id number]   Date of Service  09/10/2020  HPI/Events of Note  Bedside RN requesting PCCM ground crew to come and see patient for a follow up evaluation, patient is on ECMO.  eICU Interventions  PCCM ground crew requested to go and evaluate patient.        [name] 09/10/2020, 5:41 AM

## 2020-09-10 NOTE — Progress Notes (Addendum)
ANTICOAGULATION CONSULT NOTE  Pharmacy Consult for bivalirudin Indication: ECMO  No Known Allergies  Patient Measurements: Height: 5\' 9"  (175.3 cm) Weight: 114.7 kg (252 lb 13.9 oz) IBW/kg (Calculated) : 70.7  Vital Signs: Temp: 98.6 F (37 C) (12/12 0700) Temp Source: Bladder (12/12 0400) Pulse Rate: 97 (12/12 0700)  Labs: Recent Labs    09/08/20 0343 09/08/20 0351 09/09/20 0458 09/09/20 0501 09/09/20 1659 09/09/20 1701 09/09/20 1945 09/09/20 2000 09/10/20 0344 09/10/20 0346 09/10/20 0550  HGB 9.7*   < > 9.3*   < > 9.1*   < >  --    < > 8.7* 7.8* 7.8*  HCT 30.5*   < > 29.3*   < > 28.1*   < >  --    < > 26.8* 23.0* 23.0*  PLT 144*   < > 139*  --  143*  --   --   --  132*  --   --   APTT 52*   < > 60*   < > 61*  --  60*  --  62*  --   --   LABPROT 20.5*  --  24.5*  --   --   --   --   --  28.5*  --   --   INR 1.8*  --  2.3*  --   --   --   --   --  2.8*  --   --   CREATININE 1.04   < > 0.94  --  0.81  --   --   --  0.90  --   --    < > = values in this interval not displayed.    Estimated Creatinine Clearance: 110.4 mL/min (by C-G formula based on SCr of 0.9 mg/dL).   Assessment: 37 yom unvaccinated presenting with severe COVID ARDs, intubated on 12/6, continued to have ventilation issues despite NMB and proning - started on VV ECMO on 12/8.  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   APTT 62sec (low end of goal) on bivalirudin@0 .33 mg/kg/hr. Hgb 8.7, PLT 132.  Fibrinogen trending up 408 LHD spiked overnight to 1081.   14/8 of possible blood pulled from ETT overnight, CCM wants to hold bival 4-6 hours this am.   Goal of Therapy:  aPTT 60-70 seconds Monitor platelets by anticoagulation protocol: Yes   Plan:  Hold bival 4-6 hours Restart bivalirudin at 0.38 mg/kg/hr (dosing wt 103.6 kg) Check aPTT in 4 hours  PharmD., BCPS Clinical Pharmacist 09/10/2020 7:25 AM

## 2020-09-10 NOTE — Progress Notes (Signed)
Patient ID: Donald Rowe, male   DOB: [birth year], 59 y.o.   MRN: [id number]    Advanced Heart Failure Rounding Note   Subjective:    12/8 Cannulated for VV ECMO - [place] 12/9 Extubated/Reintubated for altered mental status  Still with delirium when awakens. Propofol continues along with Fentanyl gtt.     BP lower overnight, now off clevidipine.   I/Os even with 2 doses of IV Lasix.   Afebrile on cefepime.   CXR stable bilateral infiltrates.  Blood in ETT today.   CBC relatively stable, hgb 7.8.   On Bivalirudin. PTT 62.  LDH up to 1081.   ECMO  Speed 3600 Flow 4.8L Sweep 6 dP 23 pVen  -83  ABG: 7.39/44/58/89% Hgb 7.8 LDH 369 -> 447 -> 743 -> 1081 Lactic acid 1.3  Vent 60%    Objective:   Weight Range:  Vital Signs:   Temp:  [97.7 F (36.5 C)-98.6 F (37 C)] 98.6 F (37 C) (12/12 0830) Pulse Rate:  [87-124] 98 (12/12 0830) Resp:  [14-34] 23 (12/12 0830) SpO2:  [84 %-96 %] 92 % (12/12 0830) Arterial Line BP: (104-161)/(41-61) 104/45 (12/12 0830) FiO2 (%):  [40 %-60 %] 60 % (12/12 0359) Weight:  [114.7 kg] 114.7 kg (12/12 0500) Last BM Date: 09/09/20 (fecal management system in place)  Weight change: Filed Weights   09/08/20 0408 09/09/20 0500 09/10/20 0500  Weight: 109.9 kg 115.9 kg 114.7 kg    Intake/Output:   Intake/Output Summary (Last 24 hours) at 09/10/2020 0838 Last data filed at 09/10/2020 0700 Gross per 24 hour  Intake 6416.2 ml  Output 6195 ml  Net 221.2 ml     Physical Exam: General: Sedated on vent Neck: JVP 8-9 cm, no thyromegaly or thyroid nodule.  Lungs: Decreased bilaterally. CV: Nondisplaced PMI.  Heart regular S1/S2, no S3/S4, no murmur.  No peripheral edema.  Abdomen: Soft, nontender, no hepatosplenomegaly, no distention.  Skin: Intact without lesions or rashes.  Neurologic: Sedated  Extremities: No clubbing or cyanosis.  HEENT: Normal.    Telemetry: sinus 90s Personally reviewed   Labs: Basic Metabolic  Panel: Recent Labs  Lab 09/09/2020 0334 09/17/2020 0846 09/07/20 0438 09/07/20 0620 09/08/20 0343 09/08/20 0351 09/08/20 1639 09/08/20 1651 09/09/20 0458 09/09/20 0501 09/09/20 1659 09/09/20 1701 09/09/20 2000 09/10/20 0344 09/10/20 0346 09/10/20 0550 09/10/20 0759  NA 139   < > 144  145   < > 146*   < > 148*   < > 148*   < > 145   < > 148* 149* 151* 150* 150*  K 4.9   < > 4.3  4.3   < > 4.4   < > 3.9   < > 4.4   < > 4.1   < > 4.0 4.0 3.9 3.7 3.4*  CL 107   < > 112*   < > 116*  --  117*  --  118*  --  115*  --   --  116*  --   --   --   CO2 25   < > 26   < > 23  --  22  --  25  --  25  --   --  27  --   --   --   GLUCOSE 253*   < > 214*   < > 231*  --  139*  --  231*  --  213*  --   --  146*  --   --   --  BUN 41*   < > 40*   < > 40*  --  40*  --  40*  --  41*  --   --  41*  --   --   --   CREATININE 1.25*   < > 1.22   < > 1.04  --  0.97  --  0.94  --  0.81  --   --  0.90  --   --   --   CALCIUM 8.4*   < > 7.9*   < > 7.5*  --  7.8*  --  7.9*  --  7.8*  --   --  7.8*  --   --   --   MG 2.9*  --  2.5*  --  2.8*  --   --   --  2.7*  --   --   --   --   --   --   --   --   PHOS 4.3  --  2.0*  --  1.8*  --   --   --  2.1*  --   --   --   --   --   --   --   --    < > = values in this interval not displayed.    Liver Function Tests: Recent Labs  Lab 08/30/2020 1750 09/07/20 0438 09/08/20 0343 09/09/20 0458 09/10/20 0344  AST 32 28 42* 73* 70*  ALT 33 28 34 60* 61*  ALKPHOS 87 71 67 79 102  BILITOT 0.9 0.8 1.2 1.9* 2.0*  PROT 4.4* 4.5* 4.3* 4.5* 4.3*  ALBUMIN 2.0* 2.6* 2.3* 2.3* 2.0*   No results for input(s): LIPASE, AMYLASE in the last 168 hours. No results for input(s): AMMONIA in the last 168 hours.  CBC: Recent Labs  Lab 09/04/20 0255 09/05/20 0258 09/28/2020 0334 09/19/2020 0846 09/07/20 0438 09/07/20 0620 09/08/20 0343 09/08/20 0351 09/08/20 1639 09/08/20 1651 09/09/20 0458 09/09/20 0501 09/09/20 1659 09/09/20 1701 09/09/20 2000 09/10/20 0344  09/10/20 0346 09/10/20 0550 09/10/20 0759  WBC 17.6* 17.4* 14.5*   < > 9.0   < > 11.0*  --  11.3*  --  10.6*  --  11.0*  --   --  10.1  --   --   --   NEUTROABS 14.6* 15.0* 12.0*  --  7.7  --   --   --   --   --   --   --   --   --   --   --   --   --   --   HGB 14.2 13.5 13.8   < > 10.4*  9.5*   < > 9.7*   < > 10.3*   < > 9.3*   < > 9.1*   < > 8.5* 8.7* 7.8* 7.8* 7.8*  HCT 42.3 40.8 43.9   < > 31.3*  28.0*   < > 30.5*   < > 30.5*   < > 29.3*   < > 28.1*   < > 25.0* 26.8* 23.0* 23.0* 23.0*  MCV 91.8 92.5 97.6   < > 94.8   < > 95.6  --  93.8  --  95.1  --  94.9  --   --  96.1  --   --   --   PLT 150 152 173   < > 141*   < > 144*  --  149*  --  139*  --  143*  --   --  132*  --   --   --    < > = values in this interval not displayed.    Cardiac Enzymes: No results for input(s): CKTOTAL, CKMB, CKMBINDEX, TROPONINI in the last 168 hours.  BNP: BNP (last 3 results) No results for input(s): BNP in the last 8760 hours.  ProBNP (last 3 results) No results for input(s): PROBNP in the last 8760 hours.    Other results:  Imaging: DG CHEST PORT 1 VIEW  Result Date: 09/10/2020 CLINICAL DATA:  Hypoxia due to COVID 19 pneumonia EXAM: PORTABLE CHEST 1 VIEW COMPARISON:  09/10/2020; 09/09/2020; 09/07/2020 FINDINGS: Grossly unchanged cardiac silhouette and mediastinal contours. Stable position of support apparatus. No pneumothorax. Suspected trace bilateral effusions. Slightly improved aeration of the bilateral lung apices with persistent extensive bilateral consolidative opacities, now with air bronchograms about the hila bilaterally. No acute osseous abnormalities. IMPRESSION: 1. Stable positioning of support apparatus. No pneumothorax. 2. Slightly improved aeration of the lungs with persistent extensive consolidative opacities, now with air bronchograms about the bilateral hila Electronically Signed   By: Sandi Mariscal M.D.   On: 09/10/2020 08:30   DG CHEST PORT 1 VIEW  Result Date:  09/10/2020 CLINICAL DATA:  Shortness of breath EXAM: PORTABLE CHEST 1 VIEW COMPARISON:  X-ray 1 day prior FINDINGS: The endotracheal tube terminates above the carina. The enteric tube extends below the left hemidiaphragm. The left-sided central venous catheter is stable in positioning. ECMO catheter is essentially stable. There are diffuse hazy bilateral airspace opacities, similar to prior study. There is no pneumothorax. IMPRESSION: No significant interval change. Electronically Signed   By: Constance Holster M.D.   On: 09/10/2020 01:18   DG CHEST PORT 1 VIEW  Result Date: 09/09/2020 CLINICAL DATA:  COVID-19 pneumonia. EXAM: PORTABLE CHEST 1 VIEW COMPARISON:  September 08, 2020. FINDINGS: Stable cardiomediastinal silhouette. Endotracheal and feeding tubes are unchanged in position. Left internal jugular catheter is unchanged. Stable bilateral patchy airspace opacities are noted consistent with multifocal pneumonia. No pneumothorax is noted. Bony thorax is unremarkable. ECMO device is unchanged. IMPRESSION: Stable support apparatus. Stable bilateral multifocal pneumonia. Electronically Signed   By: Marijo Conception M.D.   On: 09/09/2020 08:35     Medications:     Scheduled Medications: . sodium chloride   Intravenous Once  . aspirin  81 mg Per Tube Daily  . baricitinib  4 mg Per Tube Daily  . chlorhexidine gluconate (MEDLINE KIT)  15 mL Mouth Rinse BID  . Chlorhexidine Gluconate Cloth  6 each Topical Daily  . docusate  100 mg Per Tube BID  . feeding supplement (PIVOT 1.5 CAL)  1,000 mL Per Tube Q24H  . feeding supplement (PROSource TF)  45 mL Per Tube BID  . fiber  1 packet Per Tube BID  . free water  300 mL Per Tube Q4H  . insulin aspart  2-6 Units Subcutaneous Q4H  . insulin aspart  8 Units Subcutaneous Q4H  . insulin detemir  24 Units Subcutaneous Q12H  . levothyroxine  50 mcg Per Tube Q0600  . mouth rinse  15 mL Mouth Rinse 10 times per day  . metoprolol tartrate  75 mg Per Tube BID   . oxyCODONE  5 mg Per Tube Q6H  . pantoprazole sodium  40 mg Per Tube Daily  . pravastatin  40 mg Per Tube q1800  . QUEtiapine  100 mg Per Tube BID  . sodium chloride flush  10-40 mL Intracatheter Q12H  . valproic acid  500  mg Per Tube BID    Infusions: . albumin human 12.5 g (09/07/20 1449)  . bivalirudin (ANGIOMAX) infusion 0.5 mg/mL (Non-ACS indications) 0.38 mg/kg/hr (09/10/20 0742)  . ceFEPime (MAXIPIME) IV 2 g (09/10/20 0820)  . clevidipine Stopped (09/09/20 2131)  . dexmedetomidine (PRECEDEX) IV infusion 0.7 mcg/kg/hr (09/10/20 0700)  . dextrose    . fentaNYL infusion INTRAVENOUS 100 mcg/hr (09/10/20 0700)  . propofol (DIPRIVAN) infusion 40 mcg/kg/min (09/10/20 0824)    PRN Medications: acetaminophen (TYLENOL) oral liquid 160 mg/5 mL, albumin human, dextrose, fentaNYL, ibuprofen, labetalol, midazolam, ondansetron **OR** ondansetron (ZOFRAN) IV, polyethylene glycol, sodium chloride flush   Assessment/Plan:   1. Acute hypoxic respiratory failure/ARDS in setting of COVID-19 PNA - Cannulated for VV ECMO on 12/8 after failing intubation x 2 days - has complete remedisivir, baricitinib, Solumedrol.  - continue abx (cefepime) - stop dates in place - LDH higher at 1081.  Pre-oxygenator PaO2  30 and post-oxygenator PaO2 477 so still functioning appropriately and minimal fibrin noted in circuit visually.  Circuit functioning appropriately.  PTT has been therapeutic.  With rise in LDH, concern will need circuit change soon (follow today as long as he continues to remain stable but consider tomorrow).  Afebrile and does not appear that rise is due to sepsis, etc.   - CXR diffuse infiltrates, stable. Cannula in good position.  - Hemoptysis noted, discussed with Dr. Lynetta Mare. Will hold bivalirudin for 6 hrs (difficult situation given LDH rise).  If continues, may need bronchoscopy.  - I/Os even with IV Lasix yesterday, will give Lasix 40 mg IV bid today.   2. Bilateral DVT - continue  bival as above after hold for hemoptysis.   3. DM2 - on insulin gtt. adjusted  4. Obesity -Body mass index is 33.73 kg/m.  5. F/E/N - Na 149.  - Free water  6. HTN - Continue metoprolol 25 mg bid.  - Stop hydralazine today as MAP is lower.   Plan discussed on multi-discplinary ECMO rounds with CCM, ECMO coordinator/specialist, PharmDs and RNs.  CRITICAL CARE Performed by: Loralie Champagne  Total critical care time: 40 minutes  Critical care time was exclusive of separately billable procedures and treating other patients.  Critical care was necessary to treat or prevent imminent or life-threatening deterioration.  Critical care was time spent personally by me (independent of midlevel providers or residents) on the following activities: development of treatment plan with patient and/or surrogate as well as nursing, discussions with consultants, evaluation of patient's response to treatment, examination of patient, obtaining history from patient or surrogate, ordering and performing treatments and interventions, ordering and review of laboratory studies, ordering and review of radiographic studies, pulse oximetry and re-evaluation of patient's condition.    Length of Stay: 8   Loralie Champagne MD 09/10/2020, 8:38 AM  Advanced Heart Failure Team Pager [phone] (M-F; 7a - 4p)  Please contact [facility] for night-coverage after hours (4p -7a ) and weekends on [email or web address]

## 2020-09-10 NOTE — Progress Notes (Signed)
Urine was clear and yellow at beginning of shift but now has turned clear, red (no clots). In addition to urine, stool around flexiseal had a red-tinge to it.  MD Agarwala and pharmacy notified and advised to continue monitoring.

## 2020-09-10 NOTE — Progress Notes (Signed)
NAME:  Donald Rowe, MRN:  [id number], DOB:  August 24, 1961, LOS: 8 ADMISSION DATE:  09/19/2020, CONSULTATION DATE:  12/6 REFERRING MD:  Dr. David Stall, CHIEF COMPLAINT:  SOB    Brief History   59 y/o M admitted 12/4 with 1 week hx of SOB, known COVID positive.  He is unvaccinated.  CTA chest negative for PE but demonstrated diffuse bilateral infiltrates.   History of present illness   59 y/o unvaccinated male, admitted 12/4 with a one week history of shortness of breath.    The patient began feeling poorly the day before Thanksgiving with fever, weakness and body aches. He tested positive for COVID on 12/27 prior to presentation.  He developed progressive shortness of breath.  In the ER, the patient was found to have saturations of 78%.  He was started on 6L O2 with initial improvement in saturations.  The patient was admitted per [facility]. CTA of the Chest was assessed and negative for pulmonary embolism but showed diffuse bilateral infiltrates. He was treated with IV remdesivir, steroids and baricitinib.  Initially, he was also treated with antibiotics but these were stopped on 12/6 with a low procalcitonin.  LE doppler negative for DVT. The patient elected to be DNR / no intubation.  He developed worsening respiratory failure with hypoxia requiring 60L flow 100% and PCCM consulted 12/6 for evaluation.   Past Medical History  Hypothyroidism  HLD   Significant Hospital Events/Procedures  12/04 Admit  12/06 PCCM consulted  12/07 Intubated, central line, a line 12/08 VV ECMO Cannulaton, cortrak  Consults:  PCCM, Heart failure, TCTS  Significant Diagnostic Tests:   CTA Chest 12/4 >> extensive bilateral airspace disease, no large PE identified, limited study   LE Venous Duplex 12/4 >> negative for DVT bilaterally   Micro Data:  COVID 12/4 >> negative  Influenza A/B 12/4 >> negative  MRSA PCR 12/5 >> negative  BCx2 12/4 >>   Antimicrobials:  Azithromycin 12/5 >> 12/6  Ceftriaxone 12/5  >> 12/6   Interim history/subjective:  Agitated yesterday periodically with associated desaturation.  Marked ventilator dyssynchrony last night back on propofol.  Dyssynchrony improved with diuresis.  Bloody secretions noted in inline suction this morning  Objective   Blood pressure (!) 167/59, pulse (!) 116, temperature 97.88 F (36.6 C), resp. rate 10, height 5\' 9"  (1.753 m), weight 114.7 kg, SpO2 96 %.    Vent Mode: PCV FiO2 (%):  [40 %-60 %] 60 % Set Rate:  [12 bmp] 12 bmp PEEP:  [10 cmH20-12 cmH20] 10 cmH20 Pressure Support:  [10 cmH20] 10 cmH20 Plateau Pressure:  [27 cmH20-35 cmH20] 28 cmH20   Intake/Output Summary (Last 24 hours) at 09/10/2020 1720 Last data filed at 09/10/2020 1600 Gross per 24 hour  Intake 6201.02 ml  Output 5795 ml  Net 406.02 ml   Filed Weights   09/08/20 0408 09/09/20 0500 09/10/20 0500  Weight: 109.9 kg 115.9 kg 114.7 kg    Examination: Constitutional: ill appearing man on vent, mild obesity Eyes: pupils 63mm, reactive.  HENT: ETT in place, dark blood-tinged secretion, OGT in place.  Cardiovascular: RRR, ext warm heart sounds unremarkable. Respiratory: Crackles and bronchial breath sounds at bases bilaterally.  No crackles heard anteriorly today Gastrointestinal: Soft active bowel sounds rectal tube in place. Skin: No rashes, line sites intact Neurologic: Sedated today no response to pain. Resolved Hospital Problem list      Assessment & Plan:   Critically ill due to acute hypoxemic and hypercarbic respiratory failure secondary to COVID  PNA with severe ARDS. S/p VV ECMO cannulation 09/07/20.  Completed remdesivir.  On baricitinib. Critically ill due to agitated delirium requiring titration of IV sedation. Elevated Pct question superimposed CAP Bilateral lower ext DVTs- assocated with COVID infection Steroid-induced hyperglycemia Hypothyroidism, HLD- on home meds  Plan:  -Continue current level of sedation. -Hemoptysis likely due to  elevated left-sided heart pressures-diurese and hold bivalirudin for 6-hour -As does not appear that we will be extubating, will proceed with prone positioning during ECMO, as this intervention may shorten duration of ECMO run -Steroids stopped as may be contributing to delirium  -Continue ultra lung protective ventilation strategy, Tolerating SBT with good Vt - Continue baricitinib -Transition to subcutaneous insulin - Continue ECMO support, bival serves for VTE tx as well - Finish course of cefepime for possible CAP - Continue PTA statin, synthroid  Best practice (evaluated daily)  Diet: TF Pain/Anxiety/Delirium protocol (if indicated) profile is off.  Continue dexmedetomidine and fentanyl.  Seroquel added for possible hypoactive delirium.  Wean off propofol if possible VAP protocol (if indicated): yes DVT prophylaxis: Bivalirudin GI prophylaxis: pantoprazole  Glucose control: Subcutaneous insulin basal bolus with good control. Mobility: bedrest Last date of multidisciplinary goals of care discussion: wife updated over the phone 12/11 Family and staff present RN, physicain Summary of discussion con't aggressive care Follow up goals of care discussion due: 12/15 Code Status: full Disposition: ICU   CRITICAL CARE Performed by: Lynnell Catalan   Total critical care time: 45 minutes  Critical care time was exclusive of separately billable procedures and treating other patients.  Critical care was necessary to treat or prevent imminent or life-threatening deterioration.  Critical care was time spent personally by me on the following activities: development of treatment plan with patient and/or surrogate as well as nursing, discussions with consultants, evaluation of patient's response to treatment, examination of patient, obtaining history from patient or surrogate, ordering and performing treatments and interventions, ordering and review of laboratory studies, ordering and review of  radiographic studies, pulse oximetry, re-evaluation of patient's condition and participation in multidisciplinary rounds.  Lynnell Catalan, MD [facility] ICU Physician [facility]  Pager: [phone] Mobile: [phone] After hours: [phone].

## 2020-09-11 ENCOUNTER — Inpatient Hospital Stay (HOSPITAL_COMMUNITY): Payer: HRSA Program

## 2020-09-11 DIAGNOSIS — Z515 Encounter for palliative care: Secondary | ICD-10-CM | POA: Diagnosis not present

## 2020-09-11 DIAGNOSIS — J9601 Acute respiratory failure with hypoxia: Secondary | ICD-10-CM | POA: Diagnosis not present

## 2020-09-11 DIAGNOSIS — U071 COVID-19: Secondary | ICD-10-CM | POA: Diagnosis not present

## 2020-09-11 DIAGNOSIS — Z7189 Other specified counseling: Secondary | ICD-10-CM | POA: Diagnosis not present

## 2020-09-11 LAB — BASIC METABOLIC PANEL
Anion gap: 8 (ref 5–15)
Anion gap: 10 (ref 5–15)
BUN: 46 mg/dL — ABNORMAL HIGH (ref 6–20)
BUN: 51 mg/dL — ABNORMAL HIGH (ref 6–20)
CO2: 28 mmol/L (ref 22–32)
CO2: 25 mmol/L (ref 22–32)
Calcium: 7.9 mg/dL — ABNORMAL LOW (ref 8.9–10.3)
Calcium: 7.7 mg/dL — ABNORMAL LOW (ref 8.9–10.3)
Chloride: 111 mmol/L (ref 98–111)
Chloride: 111 mmol/L (ref 98–111)
Creatinine, Ser: 0.99 mg/dL (ref 0.61–1.24)
Creatinine, Ser: 1.16 mg/dL (ref 0.61–1.24)
GFR, Estimated: >60 mL/min (ref >60)
GFR, Estimated: >60 mL/min (ref >60)
Glucose, Bld: 162 mg/dL — ABNORMAL HIGH (ref 70–99)
Glucose, Bld: 142 mg/dL — ABNORMAL HIGH (ref 70–99)
Potassium: 4.2 mmol/L (ref 3.5–5.1)
Potassium: 3.9 mmol/L (ref 3.5–5.1)
Sodium: 147 mmol/L — ABNORMAL HIGH (ref 135–145)
Sodium: 146 mmol/L — ABNORMAL HIGH (ref 135–145)

## 2020-09-11 LAB — POCT I-STAT 7, (LYTES, BLD GAS, ICA,H+H)
Acid-Base Excess: 3 mmol/L — ABNORMAL HIGH (ref 0.0–2.0)
Acid-Base Excess: 4 mmol/L — ABNORMAL HIGH (ref 0.0–2.0)
Acid-Base Excess: 4 mmol/L — ABNORMAL HIGH (ref 0.0–2.0)
Acid-Base Excess: 5 mmol/L — ABNORMAL HIGH (ref 0.0–2.0)
Acid-Base Excess: 5 mmol/L — ABNORMAL HIGH (ref 0.0–2.0)
Bicarbonate: 28.4 mmol/L — ABNORMAL HIGH (ref 20.0–28.0)
Bicarbonate: 28.6 mmol/L — ABNORMAL HIGH (ref 20.0–28.0)
Bicarbonate: 29.3 mmol/L — ABNORMAL HIGH (ref 20.0–28.0)
Bicarbonate: 30.1 mmol/L — ABNORMAL HIGH (ref 20.0–28.0)
Bicarbonate: 30.1 mmol/L — ABNORMAL HIGH (ref 20.0–28.0)
Calcium, Ion: 1.19 mmol/L (ref 1.15–1.40)
Calcium, Ion: 1.2 mmol/L (ref 1.15–1.40)
Calcium, Ion: 1.2 mmol/L (ref 1.15–1.40)
Calcium, Ion: 1.21 mmol/L (ref 1.15–1.40)
Calcium, Ion: 1.21 mmol/L (ref 1.15–1.40)
HCT: 19 % — ABNORMAL LOW (ref 39.0–52.0)
HCT: 21 % — ABNORMAL LOW (ref 39.0–52.0)
HCT: 23 % — ABNORMAL LOW (ref 39.0–52.0)
HCT: 25 % — ABNORMAL LOW (ref 39.0–52.0)
HCT: 26 % — ABNORMAL LOW (ref 39.0–52.0)
Hemoglobin: 6.5 g/dL — CL (ref 13.0–17.0)
Hemoglobin: 7.1 g/dL — ABNORMAL LOW (ref 13.0–17.0)
Hemoglobin: 7.8 g/dL — ABNORMAL LOW (ref 13.0–17.0)
Hemoglobin: 8.5 g/dL — ABNORMAL LOW (ref 13.0–17.0)
Hemoglobin: 8.8 g/dL — ABNORMAL LOW (ref 13.0–17.0)
O2 Saturation: 89 %
O2 Saturation: 89 %
O2 Saturation: 89 %
O2 Saturation: 90 %
O2 Saturation: 92 %
Patient temperature: 36.6
Patient temperature: 36.7
Patient temperature: 36.9
Patient temperature: 37
Patient temperature: 37
Potassium: 3.8 mmol/L (ref 3.5–5.1)
Potassium: 3.9 mmol/L (ref 3.5–5.1)
Potassium: 3.9 mmol/L (ref 3.5–5.1)
Potassium: 4 mmol/L (ref 3.5–5.1)
Potassium: 4.2 mmol/L (ref 3.5–5.1)
Sodium: 146 mmol/L — ABNORMAL HIGH (ref 135–145)
Sodium: 146 mmol/L — ABNORMAL HIGH (ref 135–145)
Sodium: 146 mmol/L — ABNORMAL HIGH (ref 135–145)
Sodium: 147 mmol/L — ABNORMAL HIGH (ref 135–145)
Sodium: 148 mmol/L — ABNORMAL HIGH (ref 135–145)
TCO2: 30 mmol/L (ref 22–32)
TCO2: 30 mmol/L (ref 22–32)
TCO2: 31 mmol/L (ref 22–32)
TCO2: 31 mmol/L (ref 22–32)
TCO2: 31 mmol/L (ref 22–32)
pCO2 arterial: 44.7 mmHg (ref 32.0–48.0)
pCO2 arterial: 45.2 mmHg (ref 32.0–48.0)
pCO2 arterial: 45.4 mmHg (ref 32.0–48.0)
pCO2 arterial: 45.6 mmHg (ref 32.0–48.0)
pCO2 arterial: 45.7 mmHg (ref 32.0–48.0)
pH, Arterial: 7.404 (ref 7.350–7.450)
pH, Arterial: 7.414 (ref 7.350–7.450)
pH, Arterial: 7.42 (ref 7.350–7.450)
pH, Arterial: 7.424 (ref 7.350–7.450)
pH, Arterial: 7.426 (ref 7.350–7.450)
pO2, Arterial: 56 mmHg — ABNORMAL LOW (ref 83.0–108.0)
pO2, Arterial: 56 mmHg — ABNORMAL LOW (ref 83.0–108.0)
pO2, Arterial: 57 mmHg — ABNORMAL LOW (ref 83.0–108.0)
pO2, Arterial: 57 mmHg — ABNORMAL LOW (ref 83.0–108.0)
pO2, Arterial: 61 mmHg — ABNORMAL LOW (ref 83.0–108.0)

## 2020-09-11 LAB — CBC
HCT: 24 % — ABNORMAL LOW (ref 39.0–52.0)
HCT: 28.3 % — ABNORMAL LOW (ref 39.0–52.0)
Hemoglobin: 8 g/dL — ABNORMAL LOW (ref 13.0–17.0)
Hemoglobin: 9.2 g/dL — ABNORMAL LOW (ref 13.0–17.0)
MCH: 31.6 pg (ref 26.0–34.0)
MCH: 30.6 pg (ref 26.0–34.0)
MCHC: 33.3 g/dL (ref 30.0–36.0)
MCHC: 32.5 g/dL (ref 30.0–36.0)
MCV: 94.9 fL (ref 80.0–100.0)
MCV: 94 fL (ref 80.0–100.0)
Platelets: 129 10*3/uL — ABNORMAL LOW (ref 150–400)
Platelets: 113 10*3/uL — ABNORMAL LOW (ref 150–400)
RBC: 2.53 MIL/uL — ABNORMAL LOW (ref 4.22–5.81)
RBC: 3.01 MIL/uL — ABNORMAL LOW (ref 4.22–5.81)
RDW: 14.6 % (ref 11.5–15.5)
RDW: 14.7 % (ref 11.5–15.5)
WBC: 15.1 10*3/uL — ABNORMAL HIGH (ref 4.0–10.5)
WBC: 12.7 10*3/uL — ABNORMAL HIGH (ref 4.0–10.5)
nRBC: 0 % (ref 0.0–0.2)
nRBC: 0 % (ref 0.0–0.2)

## 2020-09-11 LAB — GLUCOSE, CAPILLARY
Glucose-Capillary: 130 mg/dL — ABNORMAL HIGH (ref 70–99)
Glucose-Capillary: 144 mg/dL — ABNORMAL HIGH (ref 70–99)
Glucose-Capillary: 150 mg/dL — ABNORMAL HIGH (ref 70–99)
Glucose-Capillary: 154 mg/dL — ABNORMAL HIGH (ref 70–99)
Glucose-Capillary: 162 mg/dL — ABNORMAL HIGH (ref 70–99)
Glucose-Capillary: 163 mg/dL — ABNORMAL HIGH (ref 70–99)
Glucose-Capillary: 63 mg/dL — ABNORMAL LOW (ref 70–99)
Glucose-Capillary: 89 mg/dL (ref 70–99)
Glucose-Capillary: 93 mg/dL (ref 70–99)

## 2020-09-11 LAB — HEPATIC FUNCTION PANEL
ALT: 44 U/L (ref 0–44)
AST: 69 U/L — ABNORMAL HIGH (ref 15–41)
Albumin: 1.7 g/dL — ABNORMAL LOW (ref 3.5–5.0)
Alkaline Phosphatase: 128 U/L — ABNORMAL HIGH (ref 38–126)
Bilirubin, Direct: 2.3 mg/dL — ABNORMAL HIGH (ref 0.0–0.2)
Indirect Bilirubin: 1.4 mg/dL — ABNORMAL HIGH (ref 0.3–0.9)
Total Bilirubin: 3.7 mg/dL — ABNORMAL HIGH (ref 0.3–1.2)
Total Protein: 4.2 g/dL — ABNORMAL LOW (ref 6.5–8.1)

## 2020-09-11 LAB — APTT
aPTT: 71 seconds — ABNORMAL HIGH (ref 24–36)
aPTT: 73 seconds — ABNORMAL HIGH (ref 24–36)
aPTT: 80 seconds — ABNORMAL HIGH (ref 24–36)
aPTT: 81 seconds — ABNORMAL HIGH (ref 24–36)

## 2020-09-11 LAB — PHOSPHORUS: Phosphorus: 3.4 mg/dL (ref 2.5–4.6)

## 2020-09-11 LAB — C-REACTIVE PROTEIN: CRP: 25.4 mg/dL — ABNORMAL HIGH (ref <1.0)

## 2020-09-11 LAB — FIBRINOGEN: Fibrinogen: 539 mg/dL — ABNORMAL HIGH (ref 210–475)

## 2020-09-11 LAB — FERRITIN: Ferritin: 1917 ng/mL — ABNORMAL HIGH (ref 24–336)

## 2020-09-11 LAB — LACTIC ACID, PLASMA
Lactic Acid, Venous: 1.4 mmol/L (ref 0.5–1.9)
Lactic Acid, Venous: 1.7 mmol/L (ref 0.5–1.9)

## 2020-09-11 LAB — LACTATE DEHYDROGENASE
LDH: 1569 U/L — ABNORMAL HIGH (ref 98–192)
LDH: 1279 U/L — ABNORMAL HIGH (ref 98–192)
LDH: 1202 U/L — ABNORMAL HIGH (ref 98–192)

## 2020-09-11 LAB — PREPARE RBC (CROSSMATCH)

## 2020-09-11 LAB — SEDIMENTATION RATE: Sed Rate: 65 mm/hr — ABNORMAL HIGH (ref 0–16)

## 2020-09-11 LAB — TECHNOLOGIST SMEAR REVIEW

## 2020-09-11 LAB — PROTIME-INR
INR: 3.7 — ABNORMAL HIGH (ref 0.8–1.2)
Prothrombin Time: 35.5 seconds — ABNORMAL HIGH (ref 11.4–15.2)

## 2020-09-11 LAB — PROCALCITONIN: Procalcitonin: 3.32 ng/mL

## 2020-09-11 MED ORDER — AMLODIPINE BESYLATE 5 MG PO TABS: 5.0000 mg | ORAL_TABLET | Freq: Every day | ORAL | Status: DC

## 2020-09-11 MED ORDER — FREE WATER
200.0000 mL | Status: DC
  Administered 2020-09-11 – 2020-09-18 (×76): 200 mL

## 2020-09-11 MED ORDER — HEPARIN SODIUM (PORCINE) 1000 UNIT/ML IJ SOLN
2000.0000 [IU] | Freq: Once | INTRAMUSCULAR | Status: DC
  Filled 2020-09-11: qty 2

## 2020-09-11 MED ORDER — NOREPINEPHRINE 4 MG/250ML-% IV SOLN
INTRAVENOUS | Status: AC
  Filled 2020-09-11: qty 250

## 2020-09-11 MED ORDER — PROSOURCE TF PO LIQD
45.0000 mL | Freq: Four times a day (QID) | ORAL | Status: DC
  Administered 2020-09-11 – 2020-09-15 (×17): 45 mL
  Filled 2020-09-11 (×17): qty 45

## 2020-09-11 MED ORDER — CLONAZEPAM 1 MG PO TABS: 1.0000 mg | ORAL_TABLET | Freq: Two times a day (BID) | ORAL | Status: DC

## 2020-09-11 MED ORDER — CLONAZEPAM 1 MG PO TABS
1.0000 mg | ORAL_TABLET | Freq: Two times a day (BID) | ORAL | Status: DC
  Administered 2020-09-11 – 2020-09-14 (×8): 1 mg
  Filled 2020-09-11 (×8): qty 1

## 2020-09-11 MED ORDER — HEPARIN SODIUM (PORCINE) 1000 UNIT/ML IJ SOLN
1000.0000 [IU] | Freq: Once | INTRAMUSCULAR | Status: AC
  Administered 2020-09-11: 11:00:00 1000 [IU] via INTRAVENOUS

## 2020-09-11 MED ORDER — SODIUM CHLORIDE 0.9% IV SOLUTION: Freq: Once | INTRAVENOUS | Status: DC

## 2020-09-11 MED ORDER — METOPROLOL TARTRATE 25 MG/10 ML ORAL SUSPENSION
100.0000 mg | Freq: Two times a day (BID) | ORAL | Status: DC
  Administered 2020-09-11 (×2): 100 mg
  Filled 2020-09-11 (×3): qty 40

## 2020-09-11 MED ORDER — SODIUM CHLORIDE 0.9 % IV SOLN
0.2200 mg/kg/h | INTRAVENOUS | Status: DC
  Administered 2020-09-11: 07:00:00 0.25 mg/kg/h via INTRAVENOUS
  Administered 2020-09-11 – 2020-09-12 (×3): 0.23 mg/kg/h via INTRAVENOUS
  Administered 2020-09-13: 12:00:00 0.2 mg/kg/h via INTRAVENOUS
  Administered 2020-09-13: 0.23 mg/kg/h via INTRAVENOUS
  Administered 2020-09-14: 03:00:00 0.18 mg/kg/h via INTRAVENOUS
  Administered 2020-09-14: 20:00:00 0.2 mg/kg/h via INTRAVENOUS
  Administered 2020-09-15: 08:00:00 0.22 mg/kg/h via INTRAVENOUS
  Filled 2020-09-11 (×15): qty 250

## 2020-09-11 MED ORDER — AMLODIPINE BESYLATE 5 MG PO TABS
5.0000 mg | ORAL_TABLET | Freq: Every day | ORAL | Status: DC
  Administered 2020-09-11: 10:00:00 5 mg
  Filled 2020-09-11: qty 1

## 2020-09-11 MED ORDER — PIVOT 1.5 CAL PO LIQD
1000.0000 mL | ORAL | Status: DC
  Administered 2020-09-11 – 2020-09-15 (×6): 1000 mL

## 2020-09-11 MED ORDER — SODIUM CHLORIDE 0.9 % IV SOLN
0.2500 mg/kg/h | INTRAVENOUS | Status: DC
  Administered 2020-09-11: 06:00:00 0.25 mg/kg/h via INTRAVENOUS
  Filled 2020-09-11: qty 250

## 2020-09-11 MED ORDER — METHYLPREDNISOLONE SODIUM SUCC 125 MG IJ SOLR: 60.0000 mg | Freq: Four times a day (QID) | INTRAMUSCULAR | Status: DC

## 2020-09-11 MED ORDER — PHENYLEPHRINE 40 MCG/ML (10ML) SYRINGE FOR IV PUSH (FOR BLOOD PRESSURE SUPPORT)
PREFILLED_SYRINGE | INTRAVENOUS | Status: AC
  Administered 2020-09-11: 12:00:00 80 ug
  Filled 2020-09-11: qty 10

## 2020-09-11 MED ORDER — CHLORHEXIDINE GLUCONATE CLOTH 2 % EX PADS
6.0000 | MEDICATED_PAD | Freq: Every day | CUTANEOUS | Status: DC
  Administered 2020-09-11 – 2020-10-11 (×31): 6 via TOPICAL

## 2020-09-11 NOTE — Progress Notes (Signed)
ANTICOAGULATION CONSULT NOTE  Pharmacy Consult for bivalirudin Indication: ECMO + bilateral DVTs  No Known Allergies  Patient Measurements: Height: 5\' 9"  (175.3 cm) Weight: (S) 107.8 kg (237 lb 10.5 oz) (*headboard was not added with previous weight) IBW/kg (Calculated) : 70.7  Vital Signs: Temp: 98.42 F (36.9 C) (12/13 1100) Temp Source: Bladder (12/13 1055) BP: 174/54 (12/13 0753) Pulse Rate: 112 (12/13 1100)  Labs: Recent Labs    09/09/20 0458 09/09/20 0501 09/10/20 0344 09/10/20 0346 09/10/20 1738 09/10/20 1948 09/11/20 0011 09/11/20 0013 09/11/20 0503 09/11/20 0509 09/11/20 0952  HGB 9.3*   < > 8.7*   < > 8.8*   < > 7.8*  --  8.0* 7.1*  --   HCT 29.3*   < > 26.8*   < > 27.6*   < > 23.0*  --  24.0* 21.0*  --   PLT 139*   < > 132*  --  134*  --   --   --  129*  --   --   APTT 60*   < > 62*  --  77*  --   --  80* 81*  --  73*  LABPROT 24.5*  --  28.5*  --   --   --   --   --  35.5*  --   --   INR 2.3*  --  2.8*  --   --   --   --   --  3.7*  --   --   CREATININE 0.94   < > 0.90  --  0.97  --   --   --  0.99  --   --    < > = values in this interval not displayed.    Estimated Creatinine Clearance: 97.2 mL/min (by C-G formula based on SCr of 0.99 mg/dL).   Assessment: 88 yom unvaccinated presenting with severe COVID ARDs, intubated on 12/6, continued to have ventilation issues despite NMB and proning - started on VV ECMO on 12/8.  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   APTT down to 73 (supratherapeutic for current goal) on bivalirudin @ 0.25 mg/kg/hr (dosing wt = 103.6 kg). Hgb down to 7.1. Urine was bloody at beginning of shift, now dark yellow. Did have bloody secretions - now stable. Circuit changed today with increasing LDH - heparin 1000 unit bolus at 1102.  Goal of Therapy:  aPTT 60-70 seconds Monitor platelets by anticoagulation protocol: Yes   Plan:  Decrease bivalirudin to 0.23 mg/kg/hr (dosing wt 103.6 kg) APTT in 4 hours  14/8, PharmD, BCCCP Clinical Pharmacist  Phone: [phone] 09/11/2020 11:45 AM  Please check AMION for all [facility] Pharmacy phone numbers After 10:00 PM, call Main Pharmacy [phone]

## 2020-09-11 NOTE — Progress Notes (Addendum)
ANTICOAGULATION CONSULT NOTE  Pharmacy Consult for bivalirudin Indication: ECMO + bilateral DVTs  No Known Allergies  Patient Measurements: Height: 5\' 9"  (175.3 cm) Weight: (S) 107.8 kg (237 lb 10.5 oz) (*headboard was not added with previous weight) IBW/kg (Calculated) : 70.7  Vital Signs: Temp: 98.06 F (36.7 C) (12/13 0600) Temp Source: Bladder (12/13 0400) Pulse Rate: 108 (12/13 0600)  Labs: Recent Labs    09/09/20 0458 09/09/20 0501 09/10/20 0344 09/10/20 0346 09/10/20 1738 09/10/20 1948 09/11/20 0011 09/11/20 0013 09/11/20 0503 09/11/20 0509  HGB 9.3*   < > 8.7*   < > 8.8*   < > 7.8*  --  8.0* 7.1*  HCT 29.3*   < > 26.8*   < > 27.6*   < > 23.0*  --  24.0* 21.0*  PLT 139*   < > 132*  --  134*  --   --   --  129*  --   APTT 60*   < > 62*  --  77*  --   --  80* 81*  --   LABPROT 24.5*  --  28.5*  --   --   --   --   --  35.5*  --   INR 2.3*  --  2.8*  --   --   --   --   --  3.7*  --   CREATININE 0.94   < > 0.90  --  0.97  --   --   --  0.99  --    < > = values in this interval not displayed.    Estimated Creatinine Clearance: 97.2 mL/min (by C-G formula based on SCr of 0.99 mg/dL).   Assessment: 58 yom unvaccinated presenting with severe COVID ARDs, intubated on 12/6, continued to have ventilation issues despite NMB and proning - started on VV ECMO on 12/8.  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   APTT up to 81 sec (supratherapeutic for current goal) on bivalirudin @ 0.29 mg/kg/hr (dosing wt = 103.6 kg). Hgb down to 7.1. Per RN, urine was clear and yellow at beginning of shift but ~9pm turned clear/red (no clots). In addition to urine, stool around flexiseal had a red-tinge to it. Dr. 14/8 notified at that time and ok to continue bivalirudin.  Pt continues with red urine, and when they unproned patient, there was a clot in ET tube - looked like it might have been from earlier, no active bleeding from ET tube.  Goal of Therapy:  aPTT 60-70  seconds Monitor platelets by anticoagulation protocol: Yes   Plan:  Decrease bivalirudin to 0.25 mg/kg/hr (dosing wt 103.6 kg) APTT in 4 hours  Charolotte Eke, PharmD, BCPS Please see amion for complete clinical pharmacist phone list 09/11/2020 6:42 AM

## 2020-09-11 NOTE — Progress Notes (Addendum)
NAME:  Donald Rowe, MRN:  [id number], DOB:  10-Sep-1961, LOS: 9 ADMISSION DATE:  09/01/2020, CONSULTATION DATE:  12/6 REFERRING MD:  Dr. Aileen Fass, CHIEF COMPLAINT:  SOB    Brief History   59 y/o M admitted 12/4 with 1 week hx of SOB, known COVID positive.  He is unvaccinated.  CTA chest negative for PE but demonstrated diffuse bilateral infiltrates.   History of present illness   59 y/o unvaccinated male, admitted 12/4 with a one week history of shortness of breath.    The patient began feeling poorly the day before Thanksgiving with fever, weakness and body aches. He tested positive for COVID on 12/27 prior to presentation.  He developed progressive shortness of breath.  In the ER, the patient was found to have saturations of 78%.  He was started on 6L O2 with initial improvement in saturations.  The patient was admitted per [facility]. CTA of the Chest was assessed and negative for pulmonary embolism but showed diffuse bilateral infiltrates. He was treated with IV remdesivir, steroids and baricitinib.  Initially, he was also treated with antibiotics but these were stopped on 12/6 with a low procalcitonin.  LE doppler negative for DVT. The patient elected to be DNR / no intubation.  He developed worsening respiratory failure with hypoxia requiring 60L flow 100% and PCCM consulted 12/6 for evaluation.   Past Medical History  Hypothyroidism  HLD   Significant Hospital Events/Procedures  12/04 Admit  12/06 PCCM consulted  12/07 Intubated, central line, a line 12/08 VV ECMO Cannulaton, cortrak  Consults:  PCCM, Heart failure, TCTS  Significant Diagnostic Tests:   CTA Chest 12/4 >> extensive bilateral airspace disease, no large PE identified, limited study   LE Venous Duplex 12/4 >> negative for DVT bilaterally   Micro Data:  COVID 12/4 >> negative  Influenza A/B 12/4 >> negative  MRSA PCR 12/5 >> negative  BCx2 12/4 >>   Antimicrobials:  Azithromycin 12/5 >> 12/6  Ceftriaxone 12/5  >> 12/6   Interim history/subjective:  Still issues with delirium with sedation weans. LDH keeps going up.  Objective   Blood pressure (!) 167/59, pulse (!) 110, temperature 98.42 F (36.9 C), resp. rate (!) 23, height _0  (1.753 m), weight (S) 107.8 kg, SpO2 93 %.    Vent Mode: PCV FiO2 (%):  [60 %-80 %] 70 % Set Rate:  [12 bmp] 12 bmp PEEP:  [10 IRS85-46 cmH20] 10 cmH20 Pressure Support:  [10 cmH20] 10 cmH20 Plateau Pressure:  [21 cmH20-28 cmH20] 21 cmH20   Intake/Output Summary (Last 24 hours) at 09/11/2020 0720 Last data filed at 09/11/2020 0700 Gross per 24 hour  Intake 6556.5 ml  Output 5955 ml  Net 601.5 ml   Filed Weights   09/10/20 0500 09/11/20 0500 09/11/20 0600  Weight: 114.7 kg 96.5 kg (S) 107.8 kg    Examination: Constitutional: ill appearing man on vent  Eyes: pinpoint, equal, reactive to light Ears, nose, mouth, and throat: ETT in place, small blood secretons Cardiovascular: Tachycardic, ext warm Respiratory: Suprisingly clear BL, + accessory muscle use Gastrointestinal: Soft, +BS Skin: No rashes, normal turgor Neurologic: Moves all 4 ext but not to command with sedation wean per nursing Psychiatric: RASS -4 GU: urine now blood tinged  Plat 21, DP 11 ABG looks good on 5 sweep Fibrinogen 326>>408>>539 INR 2.8>>3.7 H/H dropping 743>>1081>>1569 Plts dropping CBGs good CXR worse  Resolved Hospital Problem list      Assessment & Plan:   Critically ill due to acute  hypoxemic and hypercarbic respiratory failure secondary to COVID PNA with severe ARDS. S/p VV ECMO cannulation 09/07/20.  Completed remdesivir.  On baricitinib. Critically ill due to agitated delirium requiring titration of IV sedation. Elevated Pct question superimposed CAP Bilateral lower ext DVTs- assocated with COVID infection Steroid-induced hyperglycemia Hypothyroidism, HLD- on home meds Hemolysis- worsening, circuit pressures and circuit appearance are okay but rising RPMs  raise question of occult clotting/hemolysis. Question of superimposed rheumatologic vs. Infectious process.    - First, change out circuit.  If not improvement with this on hemolysis then will do bronchoscopy look for DAH, consider PLEX, check Rheum panel, BAL, Pct, low threshold to broaden abx vs. Change circuit. - Remains on CAP coverage, complete and consider broadening - Watch blood sugars - Hold on further proning today - Continue vent support, VAP prevention bundle - Completing baricitinib - Continue depakote, seroquel, add klonipin - Keep heavily sedated for now while we figure out this hematologc process - IV lasix okay for now, add a dose of metolazone to help with hypernatremia  Best practice (evaluated daily)  Diet: TF Pain/Anxiety/Delirium protocol (if indicated): prop/fent, increasing PO options, trying to limit IV benzos VAP protocol (if indicated): yes DVT prophylaxis: Bivalirudin GI prophylaxis: pantoprazole  Glucose control: Subcutaneous insulin basal bolus with good control.   Mobility: bedrest Last date of multidisciplinary goals of care discussion: wife updated over the phone 12/11 Family and staff present RN, physicain Summary of discussion con't aggressive care Follow up goals of care discussion due: 12/15 Code Status: full Disposition: ICU   CRITICAL CARE Performed by: Candee Furbish   Total critical care time: 85 minutes  Critical care time was exclusive of separately billable procedures and treating other patients.  Critical care was necessary to treat or prevent imminent or life-threatening deterioration.  Critical care was time spent personally by me on the following activities: development of treatment plan with patient and/or surrogate as well as nursing, discussions with consultants, evaluation of patient's response to treatment, examination of patient, obtaining history from patient or surrogate, ordering and performing treatments and  interventions, ordering and review of laboratory studies, ordering and review of radiographic studies, pulse oximetry, re-evaluation of patient's condition and participation in multidisciplinary rounds.  Erskine Emery MD PCCM

## 2020-09-11 NOTE — Progress Notes (Signed)
RT note-Ventilator changed back to current settings. ETT repositioned and Etab placed back on patient, secured at 26.

## 2020-09-11 NOTE — Progress Notes (Signed)
PT Cancellation Note  Patient Details Name: Donald Rowe MRN: [id number] DOB: 18-Jul-1961   Cancelled Treatment:    Reason Eval/Treat Not Completed: (P) Medical issues which prohibited therapy Pt Hgb is 6.5 and he is being kept sedated until hematologic issues addressed. PT will follow back tomorrow to determine appropriateness.   [name] B. Beverely Risen PT, DPT Acute Rehabilitation Services Pager [phone] Office [phone]    Elon Alas Fleet 09/11/2020, 1:24 PM

## 2020-09-11 NOTE — Progress Notes (Addendum)
ANTICOAGULATION CONSULT NOTE  Pharmacy Consult for bivalirudin Indication: ECMO + bilateral DVTs  No Known Allergies  Patient Measurements: Height: 5\' 9"  (175.3 cm) Weight: 114.7 kg (252 lb 13.9 oz) IBW/kg (Calculated) : 70.7  Vital Signs: Temp: 98.6 F (37 C) (12/13 0000) Temp Source: Bladder (12/13 0000) Pulse Rate: 122 (12/13 0000)  Labs: Recent Labs    09/08/20 0343 09/08/20 0351 09/09/20 0458 09/09/20 0501 09/09/20 1659 09/09/20 1701 09/10/20 0344 09/10/20 0346 09/10/20 1738 09/10/20 1948 09/11/20 0011 09/11/20 0013  HGB 9.7*   < > 9.3*   < > 9.1*   < > 8.7*   < > 8.8* 8.8* 7.8*  --   HCT 30.5*   < > 29.3*   < > 28.1*   < > 26.8*   < > 27.6* 26.0* 23.0*  --   PLT 144*   < > 139*  --  143*  --  132*  --  134*  --   --   --   APTT 52*   < > 60*   < > 61*   < > 62*  --  77*  --   --  80*  LABPROT 20.5*  --  24.5*  --   --   --  28.5*  --   --   --   --   --   INR 1.8*  --  2.3*  --   --   --  2.8*  --   --   --   --   --   CREATININE 1.04   < > 0.94  --  0.81  --  0.90  --  0.97  --   --   --    < > = values in this interval not displayed.    Estimated Creatinine Clearance: 102.4 mL/min (by C-G formula based on SCr of 0.97 mg/dL).   Assessment: 68 yom unvaccinated presenting with severe COVID ARDs, intubated on 12/6, continued to have ventilation issues despite NMB and proning - started on VV ECMO on 12/8.  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   APTT up to 80 sec (supratherapeutic for current goal) on bivalirudin@0 .34 mg/kg/hr. Hgb 7.8. Per RN, urine was clear and yellow at beginning of shift but ~9pm turned clear/red (no clots). In addition to urine, stool around flexiseal had a red-tinge to it. Dr. 14/8 notified and ok to continue bivalirudin for now.   Goal of Therapy:  aPTT 60-70 seconds Monitor platelets by anticoagulation protocol: Yes   Plan:  Decrease bivalirudin to 0.29 mg/kg/hr (dosing wt 103.6 kg) APTT in 4-6 hours  Charolotte Eke,  PharmD, BCPS Please see amion for complete clinical pharmacist phone list 09/11/2020 12:48 AM

## 2020-09-11 NOTE — Progress Notes (Signed)
Patient ID: Donald Rowe, male   DOB: 11-10-1960, 59 y.o.   MRN: [id number]    Advanced Heart Failure Rounding Note   Subjective:    12/8 Cannulated for VV ECMO - [place] 12/9 Extubated/Reintubated for altered mental status  Still with delirium when awakens. Propofol continues along with Fentanyl gtt.     Was proned yesterday. Says remain in the 90s.   LDH now climbing x 2 days. Hgb relatively stable at 8.0. Platelets trending down slowly. Some bleeding in ETT and Foley.   ECMO  Speed 3600 -> 4100 Flow 4.8L -> 5.0L Sweep 6 -> 5 dP 23 -> 26 pVen  -97  ABG: 7.42/46/61/92% Hgb 7.8 -> 8.0 LDH 369 -> 447 -> 743 -> 1081 -> 1569 Lactic acid 1.4 PTT 81  CXR: worsening diffuse bilateral infiltrates. Cannula position ok. Personally reviewed    Objective:   Weight Range:  Vital Signs:   Temp:  [97.88 F (36.6 C)-99.14 F (37.3 C)] 98.6 F (37 C) (12/13 0753) Pulse Rate:  [98-126] 116 (12/13 0753) Resp:  [0-32] 23 (12/13 0753) BP: (174)/(54) 174/54 (12/13 0753) SpO2:  [86 %-100 %] 90 % (12/13 0753) Arterial Line BP: (92-169)/(38-61) 158/51 (12/13 0700) FiO2 (%):  [60 %-80 %] 70 % (12/13 0753) Weight:  [96.5 kg-107.8 kg] 107.8 kg (12/13 0600) Last BM Date: 09/10/20  Weight change: Filed Weights   09/10/20 0500 09/11/20 0500 09/11/20 0600  Weight: 114.7 kg 96.5 kg (S) 107.8 kg    Intake/Output:   Intake/Output Summary (Last 24 hours) at 09/11/2020 4742 Last data filed at 09/11/2020 0800 Gross per 24 hour  Intake 6092.84 ml  Output 5630 ml  Net 462.84 ml     Physical Exam: General: Sedated on vent HEENT: normal + ETT Neck: supple. RIJ ECMO  Cor: PMI nondisplaced. Regular rate & rhythm. No rubs, gallops or murmurs. Lungs: coarse Abdomen: obee soft, nontender, nondistended. No hepatosplenomegaly. No bruits or masses. Good bowel sounds. Extremities: no cyanosis, clubbing, rash, edema Neuro: sedated on vent    Telemetry: sinus 100-120 Personally  reviewed   Labs: Basic Metabolic Panel: Recent Labs  Lab 09/29/2020 0334 09/16/2020 0846 09/07/20 0438 09/07/20 0620 09/08/20 0343 09/08/20 0351 09/09/20 0458 09/09/20 0501 09/09/20 1659 09/09/20 1701 09/10/20 0344 09/10/20 0346 09/10/20 1738 09/10/20 1948 09/11/20 0011 09/11/20 0503 09/11/20 0509  NA 139   < > 144  145   < > 146*   < > 148*   < > 145   < > 149*   < > 146* 148* 146* 147* 146*  K 4.9   < > 4.3  4.3   < > 4.4   < > 4.4   < > 4.1   < > 4.0   < > 3.9 3.5 3.9 4.2 4.2  CL 107   < > 112*   < > 116*   < > 118*  --  115*  --  116*  --  111  --   --  111  --   CO2 25   < > 26   < > 23   < > 25  --  25  --  27  --  28  --   --  28  --   GLUCOSE 253*   < > 214*   < > 231*   < > 231*  --  213*  --  146*  --  151*  --   --  162*  --  BUN 41*   < > 40*   < > 40*   < > 40*  --  41*  --  41*  --  42*  --   --  46*  --   CREATININE 1.25*   < > 1.22   < > 1.04   < > 0.94  --  0.81  --  0.90  --  0.97  --   --  0.99  --   CALCIUM 8.4*   < > 7.9*   < > 7.5*   < > 7.9*  --  7.8*  --  7.8*  --  8.0*  --   --  7.9*  --   MG 2.9*  --  2.5*  --  2.8*  --  2.7*  --   --   --   --   --   --   --   --   --   --   PHOS 4.3  --  2.0*  --  1.8*  --  2.1*  --   --   --   --   --   --   --   --   --   --    < > = values in this interval not displayed.    Liver Function Tests: Recent Labs  Lab 09/07/20 0438 09/08/20 0343 09/09/20 0458 09/10/20 0344 09/11/20 0503  AST 28 42* 73* 70* 69*  ALT 28 34 60* 61* 44  ALKPHOS 71 67 79 102 128*  BILITOT 0.8 1.2 1.9* 2.0* 3.7*  PROT 4.5* 4.3* 4.5* 4.3* 4.2*  ALBUMIN 2.6* 2.3* 2.3* 2.0* 1.7*   No results for input(s): LIPASE, AMYLASE in the last 168 hours. No results for input(s): AMMONIA in the last 168 hours.  CBC: Recent Labs  Lab 09/05/20 0258 09/17/2020 0334 09/19/2020 0846 09/07/20 0438 09/07/20 0620 09/09/20 0458 09/09/20 0501 09/09/20 1659 09/09/20 1701 09/10/20 0344 09/10/20 0346 09/10/20 1738 09/10/20 1948 09/11/20 0011  09/11/20 0503 09/11/20 0509  WBC 17.4* 14.5*   < > 9.0   < > 10.6*  --  11.0*  --  10.1  --  11.1*  --   --  15.1*  --   NEUTROABS 15.0* 12.0*  --  7.7  --   --   --   --   --   --   --   --   --   --   --   --   HGB 13.5 13.8   < > 10.4*  9.5*   < > 9.3*   < > 9.1*   < > 8.7*   < > 8.8* 8.8* 7.8* 8.0* 7.1*  HCT 40.8 43.9   < > 31.3*  28.0*   < > 29.3*   < > 28.1*   < > 26.8*   < > 27.6* 26.0* 23.0* 24.0* 21.0*  MCV 92.5 97.6   < > 94.8   < > 95.1  --  94.9  --  96.1  --  95.5  --   --  94.9  --   PLT 152 173   < > 141*   < > 139*  --  143*  --  132*  --  134*  --   --  129*  --    < > = values in this interval not displayed.    Cardiac Enzymes: No results for input(s): CKTOTAL, CKMB, CKMBINDEX, TROPONINI in the last 168 hours.  BNP: BNP (last 3  results) No results for input(s): BNP in the last 8760 hours.  ProBNP (last 3 results) No results for input(s): PROBNP in the last 8760 hours.    Other results:  Imaging: DG CHEST PORT 1 VIEW  Result Date: 09/11/2020 CLINICAL DATA:  Hypoxia.  ECMO treatment EXAM: PORTABLE CHEST 1 VIEW COMPARISON:  September 10, 2020 FINDINGS: Endotracheal tube tip is 5.8 cm above the carina. Central catheter tip is in the superior vena cava. Feeding tube tip is below the diaphragm. ECMO catheter tip is in the inferior vena cava. No pneumothorax. Widespread airspace opacity persists bilaterally with underlying pleural effusions. There is stable cardiomegaly with pulmonary vascularity within normal limits. No adenopathy. No bone lesions. IMPRESSION: Tube and catheter positions as described without pneumothorax. Small pleural effusions bilaterally with widespread airspace opacity bilaterally, stable. Stable cardiac prominence. Electronically Signed   By: Lowella Grip III M.D.   On: 09/11/2020 08:03   DG CHEST PORT 1 VIEW  Result Date: 09/10/2020 CLINICAL DATA:  Hypoxia due to COVID 19 pneumonia EXAM: PORTABLE CHEST 1 VIEW COMPARISON:  09/10/2020;  09/09/2020; 09/07/2020 FINDINGS: Grossly unchanged cardiac silhouette and mediastinal contours. Stable position of support apparatus. No pneumothorax. Suspected trace bilateral effusions. Slightly improved aeration of the bilateral lung apices with persistent extensive bilateral consolidative opacities, now with air bronchograms about the hila bilaterally. No acute osseous abnormalities. IMPRESSION: 1. Stable positioning of support apparatus. No pneumothorax. 2. Slightly improved aeration of the lungs with persistent extensive consolidative opacities, now with air bronchograms about the bilateral hila Electronically Signed   By: Sandi Mariscal M.D.   On: 09/10/2020 08:30   DG CHEST PORT 1 VIEW  Result Date: 09/10/2020 CLINICAL DATA:  Shortness of breath EXAM: PORTABLE CHEST 1 VIEW COMPARISON:  X-ray 1 day prior FINDINGS: The endotracheal tube terminates above the carina. The enteric tube extends below the left hemidiaphragm. The left-sided central venous catheter is stable in positioning. ECMO catheter is essentially stable. There are diffuse hazy bilateral airspace opacities, similar to prior study. There is no pneumothorax. IMPRESSION: No significant interval change. Electronically Signed   By: Constance Holster M.D.   On: 09/10/2020 01:18     Medications:     Scheduled Medications: . sodium chloride   Intravenous Once  . amLODipine  5 mg Per Tube Daily  . aspirin  81 mg Per Tube Daily  . baricitinib  4 mg Per Tube Daily  . chlorhexidine gluconate (MEDLINE KIT)  15 mL Mouth Rinse BID  . Chlorhexidine Gluconate Cloth  6 each Topical Daily  . clonazePAM  1 mg Per Tube BID  . docusate  100 mg Per Tube BID  . feeding supplement (PIVOT 1.5 CAL)  1,000 mL Per Tube Q24H  . feeding supplement (PROSource TF)  45 mL Per Tube BID  . fiber  1 packet Per Tube BID  . free water  200 mL Per Tube Q2H  . furosemide  40 mg Intravenous BID  . insulin aspart  2-6 Units Subcutaneous Q4H  . insulin aspart  8  Units Subcutaneous Q4H  . insulin detemir  24 Units Subcutaneous Q12H  . levothyroxine  50 mcg Per Tube Q0600  . mouth rinse  15 mL Mouth Rinse 10 times per day  . methylPREDNISolone (SOLU-MEDROL) injection  60 mg Intravenous Q6H  . metoprolol tartrate  100 mg Per Tube BID  . oxyCODONE  5 mg Per Tube Q6H  . pantoprazole sodium  40 mg Per Tube Daily  . potassium chloride  20  mEq Per Tube BID  . pravastatin  40 mg Per Tube q1800  . QUEtiapine  100 mg Per Tube BID  . sodium chloride flush  10-40 mL Intracatheter Q12H  . valproic acid  500 mg Per Tube BID    Infusions: . albumin human 12.5 g (09/07/20 1449)  . bivalirudin (ANGIOMAX) infusion 0.5 mg/mL (Non-ACS indications) 0.25 mg/kg/hr (09/11/20 0700)  . ceFEPime (MAXIPIME) IV Stopped (09/11/20 0058)  . dextrose    . fentaNYL infusion INTRAVENOUS 175 mcg/hr (09/11/20 0700)  . norepinephrine (LEVOPHED) Adult infusion Stopped (09/10/20 1331)  . propofol (DIPRIVAN) infusion 40 mcg/kg/min (09/11/20 0700)    PRN Medications: acetaminophen (TYLENOL) oral liquid 160 mg/5 mL, albumin human, dextrose, fentaNYL, labetalol, midazolam, ondansetron **OR** ondansetron (ZOFRAN) IV, polyethylene glycol, sodium chloride flush   Assessment/Plan:   1. Acute hypoxic respiratory failure/ARDS in setting of COVID-19 PNA - Cannulated for VV ECMO on 12/8 after failing intubation x 2 days - has complete remedisivir, baricitinib, Solumedrol.  - continue abx (cefepime) - stop dates in place - LDH continues to climb suggestive of hemolysis. dP stable but there has been a rise in the ratio between flow/RPMs. No mechanical issues noted with cannula/circuit. Will change circuit this am. If no benefit will need PLEX - CXR with severe diffuse infiltrates. Cannula in good position.  - Mild hemoptysis. Would avoid bronch if possible unless worsens. Increase steroids. Adjust bival dose.   2. Bilateral DVT - remains on bival  3. DM2 - on insulin gtt.  adjusted  4. Obesity -Body mass index is 33.73 kg/m.  5. F/E/N - Na 147 - Continue free water  6. HTN - Continue metoprolol 25 mg bid.   Plan today: change circuit. Follow LDH. If not  Improving start PLEX. Follow closely for increased bleeding.   Plan discussed on multi-discplinary ECMO rounds with CCM, ECMO coordinator/specialist, PharmDs and RNs.  CRITICAL CARE Performed by: Glori Bickers  Total critical care time: 45 minutes  Critical care time was exclusive of separately billable procedures and treating other patients.  Critical care was necessary to treat or prevent imminent or life-threatening deterioration.  Critical care was time spent personally by me (independent of midlevel providers or residents) on the following activities: development of treatment plan with patient and/or surrogate as well as nursing, discussions with consultants, evaluation of patient's response to treatment, examination of patient, obtaining history from patient or surrogate, ordering and performing treatments and interventions, ordering and review of laboratory studies, ordering and review of radiographic studies, pulse oximetry and re-evaluation of patient's condition.    Length of Stay: 9   Glori Bickers MD 09/11/2020, 8:22 AM  Advanced Heart Failure Team Pager [phone] (M-F; [place])  Please contact [facility] for night-coverage after hours (4p -7a ) and weekends on [email or web address]

## 2020-09-11 NOTE — Progress Notes (Signed)
Assisted tele visit to patient with family member.  Vela Render McEachran, RN  

## 2020-09-11 NOTE — Progress Notes (Signed)
Patient supined at 345 am. ETT retaped to the left at 26cm. Patient tolerated well.

## 2020-09-11 NOTE — Procedures (Signed)
Extracorporeal support note   ECLS support day: 5 Indication: COVD ARDS  Configuration: VV, RIJ 32Fr [place]  Pump speed: 4050 Pump flow: 4.88 Pump used: Cardiohelp  Sweep gas: 100%,5LPM  Circuit check: no clots Anticoagulant: bivalirudin Anticoagulation targets: PTT 50-70    Changes in support: Continue current support, look toward trying to get on good oral sedation regimen and weaning Gtts  Anticipated goals/duration of support: bridge to recovery  Myrla Halsted MD PCCM  09/11/2020, 7:18 AM

## 2020-09-11 NOTE — Progress Notes (Signed)
During multidisciplinary rounds, the decision was made by Dr. Gala Romney and the care team to change out the ECMO circuit. A new circuit was set up and primed using sterile technique and brought to the patient bedside by Patton Salles, ECMO Coordinator. The following staff were present for the procedure: Merlene Laughter, RRT, ECMO Specialist, Devota Pace, RN, ECMO Specialist, Cathie Beams, RN, ECMO Specialist, Aline August, RN, ECMO Specialist, and Patton Salles, ECMO Coordinator.  Myrla Halsted, MD PCCM and Malva Cogan, PharmD were standing outside the patient's room door.  The field was prepped and draped, a timeout was performed, and the team completed a briefing prior to the start of the procedure. 1000 units of Heparin were given per pharmacy. The ECMO circuit was clamped out at 11:13, cut and reconnected to the new circuit via a wet-to-wet connection. Flow was reinitiated at 11:13 and 3600 rpms were resumed for a flow of 4.98 LPM.  Disposable Lot Numbers:  Oxygenator Lot #: [id number]  Tubing Lot #: [id number]  Cardiohelp Console #:  1

## 2020-09-11 NOTE — Progress Notes (Addendum)
Nutrition Follow Up  DOCUMENTATION CODES:   Not applicable  INTERVENTION:   Check Phosphorus  Tube feeding:  -Pivot 1.5 @ 55 ml/hr via Cortrak (1320 ml) -ProSource TF 45 ml QID -Free water flushes 200 ml Q2 hours   Provides: 2140 kcal, 168 grams protein, 990 ml free water (3390 ml with flushes). Kcal exceed requirement to meet protein needs.   NUTRITION DIAGNOSIS:   Increased nutrient needs related to acute illness (COVID PNA) as evidenced by estimated needs   Ongoing  GOAL:   Patient will meet greater than or equal to 90% of their needs   Addressed via TF  MONITOR:   Vent status,Skin,Weight trends,Labs,I & O's,TF tolerance  REASON FOR ASSESSMENT:   Ventilator,Consult Enteral/tube feeding initiation and management  ASSESSMENT:   Patient with PMH significant for asthma, cancer, CHF, DM, HTN, sickle cell anemia, renal insufficiency, and hyperlipidemia. Presents this admission with severe COVID ARDS.   12/4- admit [facility] 12/6- hypoxia, intubated 12/7- transfer to [facility] for ECMO 12/8- VV ECMO cannulation, post pyloric Cortrak placed 12/9- extubated, re-intubated   Pt discussed during ICU rounds and with RN.   Delirium with sedation weans. Back on propofol. Plan circuit change today due to worsening hemolysis. May require PLEX if no improvement. Previously proning 16:8, but being held today. Hypernatremia continues, free water increased further. Tube feeding adjusted for current propofol rate.   Admission weight: 101.9 kg  Current weight: 107.8 kg   Patient is currently intubated on ventilator support MV: 8.7 L/min Temp (24hrs), Avg:98.5 F (36.9 C), Min:97.88 F (36.6 C), Max:99.14 F (37.3 C)  Propofol: 27.8 ml/hr- provides 734 kcal from lipids daily   UOP: 5905 ml x 24 hrs  Stool: 50 ml x 24 hrs   Drips: albumin, levophed, propofol Medications: colace, nutrisource fiber BID, 40 mg lasix BID, SS novolog, levemir, 20 mEq KCl BID Labs: Na 146 (H) CBG  63-231  Diet Order:   Diet Order            Diet NPO time specified  Diet effective now                 EDUCATION NEEDS:   Not appropriate for education at this time  Skin:  Skin Assessment: Reviewed RN Assessment  Last BM:  12/13  Height:   Ht Readings from Last 1 Encounters:  09/05/20 5\' 9"  (1.753 m)    Weight:   Wt Readings from Last 1 Encounters:  09/11/20 (S) 107.8 kg    Adjusted Body Weight:  84.5 kg   BMI:  Body mass index is 35.1 kg/m.  Estimated Nutritional Needs:   Kcal:  09/13/20  Protein:  145-180 grams  Fluid:  >/= 2 L/day  [id number] RD, LDN Clinical Nutrition Pager listed in AMION

## 2020-09-11 NOTE — Progress Notes (Signed)
ANTICOAGULATION CONSULT NOTE  Pharmacy Consult for bivalirudin Indication: ECMO + bilateral DVTs  No Known Allergies  Patient Measurements: Height: 5\' 9"  (175.3 cm) Weight: (S) 107.8 kg (237 lb 10.5 oz) (*headboard was not added with previous weight) IBW/kg (Calculated) : 70.7  Vital Signs: Temp: 98.42 F (36.9 C) (12/13 1700) Temp Source: Bladder (12/13 1500) BP: 136/44 (12/13 1500) Pulse Rate: 108 (12/13 1700)  Labs: Recent Labs    09/09/20 0458 09/09/20 0501 09/10/20 0344 09/10/20 0346 09/10/20 1738 09/10/20 1948 09/11/20 0503 09/11/20 0509 09/11/20 0952 09/11/20 1229 09/11/20 1615 09/11/20 1617  HGB 9.3*   < > 8.7*   < > 8.8*   < > 8.0* 7.1*  --  6.5*  --  8.8*  HCT 29.3*   < > 26.8*   < > 27.6*   < > 24.0* 21.0*  --  19.0*  --  26.0*  PLT 139*   < > 132*  --  134*  --  129*  --   --   --   --   --   APTT 60*   < > 62*  --  77*   < > 81*  --  73*  --  71*  --   LABPROT 24.5*  --  28.5*  --   --   --  35.5*  --   --   --   --   --   INR 2.3*  --  2.8*  --   --   --  3.7*  --   --   --   --   --   CREATININE 0.94   < > 0.90  --  0.97  --  0.99  --   --   --   --   --    < > = values in this interval not displayed.    Estimated Creatinine Clearance: 97.2 mL/min (by C-G formula based on SCr of 0.99 mg/dL).   Assessment: 80 yom unvaccinated presenting with severe COVID ARDs, intubated on 12/6, continued to have ventilation issues despite NMB and proning - started on VV ECMO on 12/8.  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   Repeat aPTT this afternoon is 71 seconds, slightly above goal. Given that pt has bilateral DVTs will continue current rate since it is borderline therapeutic to prevent potentially dropping subtherapeutic. H/H up from am labs.   Goal of Therapy:  aPTT 60-70 seconds Monitor platelets by anticoagulation protocol: Yes   Plan:  Continue bivalirudin 0.23 mg/kg/h (dosing wt 103kg) Check aPTT q12h  14/8, PharmD, BCPS,  [facility] Clinical Pharmacist [phone] Please check AMION for all [facility] Pharmacy numbers 09/11/2020

## 2020-09-11 NOTE — Progress Notes (Signed)
OT Cancellation Note  Patient Details Name: Donald Rowe MRN: [id number] DOB: Jan 14, 1961   Cancelled Treatment:    Reason Eval/Treat Not Completed: Medical issues which prohibited therapy. Hgb is 6.5 and he is being kept sedated until hematologic issues addressed. Will return as schedule allows and pt medically ready.  Kariss Longmire M Jamita Mckelvin Le Faulcon MSOT, OTR/L Acute Rehab Pager: [phone] Office: [phone] 09/11/2020, 1:49 PM

## 2020-09-11 NOTE — Progress Notes (Signed)
RT note- ventilator setting changed for circuit change.

## 2020-09-11 NOTE — Progress Notes (Signed)
Palliative:  Mr. Stetson goes by "[name]" not [name]. and not [name] (he hates these names per wife).   HPI: 59 y.o.malewith a pertinent history ofhypothyroidism on levothyroxine and hyperlipidemia who was diagnosed with Covid 1 week ago and is not vaccinated. With EMS he was seen to be desaturating to 78% on room air. He states he started having symptoms on Friday and tested positive on Saturday.Had initially elected to be DNAR without intubation though later changed this and got intubated on 12/6  in the setting of worsening respiratory failure with hypoxia. CTA chest w/ diffuse bilateral infiltrates. On 12/8 was cannulated for VV ECMO. Had been extubated on 12/9 then emergently reintubated in early evening due to clinical instability. Palliative care was asked to get involved in the setting of VV ECMO to set goals and expectations with [name]'s family. He continues on VV ECMO and ventilator support. Plans for circuit change 09/11/20.    [name] is sedated on vent with ECMO. 70% FiO2. He appears comfortable and without distress. Discussed with RN and plans for circuit change today. PCXR remains with severe diffuse infiltrates.   I called and introduced myself to wife, [name]. We discussed plans for today for circuit change with concern for clotting/hemolysis and possible need for plasmapheresis or bronchoscopy. I explained to [facility] that her husband has a good medical team that meet together every morning to formulate a plan to optimize her husband's care for the day and keep him as stable as possible. Donald Rowe asks many thoughtful questions about her husband's care. She does tell me that she was told that he may need a tracheostomy in the near future. We discussed the purpose of tracheostomy to allow for longer term ventilator support and that this may be needed in the future but not something to be decided today. We did discuss patience as this is anticipated to be an illness that will take time for improvement and  as long as we are seeing signs of improvement and not worsening then we will accept those small improvements as steps in the right direction. Donald Rowe was appreciative of the update. I will update her again tomorrow.    Exam: Sedated on vent. VV ECMO. Cortrak in place. No distress. 70% FiO2 and tolerating vent with no dyssynchrony. Abd flat.   Plan: - Continue full aggressive care with hopes of eventual improvement.   25 min  Yong Channel, NP Palliative Medicine Team Pager [phone] (Please see [email or web address] for schedule) Team Phone [phone]    Greater than 50%  of this time was spent counseling and coordinating care related to the above assessment and plan

## 2020-09-12 ENCOUNTER — Inpatient Hospital Stay (HOSPITAL_COMMUNITY): Payer: HRSA Program

## 2020-09-12 DIAGNOSIS — Z515 Encounter for palliative care: Secondary | ICD-10-CM | POA: Diagnosis not present

## 2020-09-12 DIAGNOSIS — Z7189 Other specified counseling: Secondary | ICD-10-CM | POA: Diagnosis not present

## 2020-09-12 DIAGNOSIS — U071 COVID-19: Secondary | ICD-10-CM | POA: Diagnosis not present

## 2020-09-12 DIAGNOSIS — J80 Acute respiratory distress syndrome: Secondary | ICD-10-CM | POA: Diagnosis not present

## 2020-09-12 DIAGNOSIS — J9601 Acute respiratory failure with hypoxia: Secondary | ICD-10-CM | POA: Diagnosis not present

## 2020-09-12 DIAGNOSIS — E871 Hypo-osmolality and hyponatremia: Secondary | ICD-10-CM | POA: Diagnosis not present

## 2020-09-12 LAB — HEPATIC FUNCTION PANEL
ALT: 36 U/L (ref 0–44)
AST: 52 U/L — ABNORMAL HIGH (ref 15–41)
Albumin: 1.9 g/dL — ABNORMAL LOW (ref 3.5–5.0)
Alkaline Phosphatase: 137 U/L — ABNORMAL HIGH (ref 38–126)
Bilirubin, Direct: 1.3 mg/dL — ABNORMAL HIGH (ref 0.0–0.2)
Indirect Bilirubin: 0.9 mg/dL (ref 0.3–0.9)
Total Bilirubin: 2.2 mg/dL — ABNORMAL HIGH (ref 0.3–1.2)
Total Protein: 4.7 g/dL — ABNORMAL LOW (ref 6.5–8.1)

## 2020-09-12 LAB — BPAM RBC
Blood Product Expiration Date: 202201132359
Blood Product Expiration Date: 202201132359
Blood Product Expiration Date: 202201132359
Blood Product Expiration Date: 202201142359
Blood Product Expiration Date: 202201142359
Blood Product Expiration Date: 202201142359
Blood Product Expiration Date: 202201142359
Blood Product Expiration Date: 202201142359
ISSUE DATE / TIME: 202112130944
ISSUE DATE / TIME: 202112130944
ISSUE DATE / TIME: 202112130944
ISSUE DATE / TIME: 202112131256
ISSUE DATE / TIME: 202112131256
Unit Type and Rh: 5100
Unit Type and Rh: 5100
Unit Type and Rh: 5100
Unit Type and Rh: 5100
Unit Type and Rh: 5100
Unit Type and Rh: 5100
Unit Type and Rh: 5100
Unit Type and Rh: 5100

## 2020-09-12 LAB — TYPE AND SCREEN
ABO/RH(D): O POS
Antibody Screen: NEGATIVE
Unit division: 0
Unit division: 0
Unit division: 0
Unit division: 0
Unit division: 0
Unit division: 0
Unit division: 0
Unit division: 0

## 2020-09-12 LAB — BASIC METABOLIC PANEL
Anion gap: 8 (ref 5–15)
Anion gap: 7 (ref 5–15)
Anion gap: 8 (ref 5–15)
BUN: 55 mg/dL — ABNORMAL HIGH (ref 6–20)
BUN: 61 mg/dL — ABNORMAL HIGH (ref 6–20)
BUN: 67 mg/dL — ABNORMAL HIGH (ref 6–20)
CO2: 28 mmol/L (ref 22–32)
CO2: 27 mmol/L (ref 22–32)
CO2: 26 mmol/L (ref 22–32)
Calcium: 8 mg/dL — ABNORMAL LOW (ref 8.9–10.3)
Calcium: 8 mg/dL — ABNORMAL LOW (ref 8.9–10.3)
Calcium: 8.1 mg/dL — ABNORMAL LOW (ref 8.9–10.3)
Chloride: 111 mmol/L (ref 98–111)
Chloride: 111 mmol/L (ref 98–111)
Chloride: 111 mmol/L (ref 98–111)
Creatinine, Ser: 1.27 mg/dL — ABNORMAL HIGH (ref 0.61–1.24)
Creatinine, Ser: 1.36 mg/dL — ABNORMAL HIGH (ref 0.61–1.24)
Creatinine, Ser: 1.47 mg/dL — ABNORMAL HIGH (ref 0.61–1.24)
GFR, Estimated: >60 mL/min (ref >60)
GFR, Estimated: 60 mL/min — ABNORMAL LOW (ref >60)
GFR, Estimated: 55 mL/min — ABNORMAL LOW (ref >60)
Glucose, Bld: 133 mg/dL — ABNORMAL HIGH (ref 70–99)
Glucose, Bld: 172 mg/dL — ABNORMAL HIGH (ref 70–99)
Glucose, Bld: 193 mg/dL — ABNORMAL HIGH (ref 70–99)
Potassium: 4.7 mmol/L (ref 3.5–5.1)
Potassium: 5.9 mmol/L — ABNORMAL HIGH (ref 3.5–5.1)
Potassium: 5.9 mmol/L — ABNORMAL HIGH (ref 3.5–5.1)
Sodium: 147 mmol/L — ABNORMAL HIGH (ref 135–145)
Sodium: 145 mmol/L (ref 135–145)
Sodium: 145 mmol/L (ref 135–145)

## 2020-09-12 LAB — POCT I-STAT 7, (LYTES, BLD GAS, ICA,H+H)
Acid-Base Excess: 2 mmol/L (ref 0.0–2.0)
Acid-Base Excess: 4 mmol/L — ABNORMAL HIGH (ref 0.0–2.0)
Acid-Base Excess: 3 mmol/L — ABNORMAL HIGH (ref 0.0–2.0)
Acid-Base Excess: 4 mmol/L — ABNORMAL HIGH (ref 0.0–2.0)
Acid-Base Excess: 3 mmol/L — ABNORMAL HIGH (ref 0.0–2.0)
Acid-Base Excess: 2 mmol/L (ref 0.0–2.0)
Bicarbonate: 30.3 mmol/L — ABNORMAL HIGH (ref 20.0–28.0)
Bicarbonate: 30.1 mmol/L — ABNORMAL HIGH (ref 20.0–28.0)
Bicarbonate: 30.1 mmol/L — ABNORMAL HIGH (ref 20.0–28.0)
Bicarbonate: 30.6 mmol/L — ABNORMAL HIGH (ref 20.0–28.0)
Bicarbonate: 29.5 mmol/L — ABNORMAL HIGH (ref 20.0–28.0)
Bicarbonate: 28.5 mmol/L — ABNORMAL HIGH (ref 20.0–28.0)
Calcium, Ion: 1.22 mmol/L (ref 1.15–1.40)
Calcium, Ion: 1.25 mmol/L (ref 1.15–1.40)
Calcium, Ion: 1.19 mmol/L (ref 1.15–1.40)
Calcium, Ion: 1.19 mmol/L (ref 1.15–1.40)
Calcium, Ion: 1.2 mmol/L (ref 1.15–1.40)
Calcium, Ion: 1.28 mmol/L (ref 1.15–1.40)
HCT: 40 % (ref 39.0–52.0)
HCT: 24 % — ABNORMAL LOW (ref 39.0–52.0)
HCT: 27 % — ABNORMAL LOW (ref 39.0–52.0)
HCT: 26 % — ABNORMAL LOW (ref 39.0–52.0)
HCT: 24 % — ABNORMAL LOW (ref 39.0–52.0)
HCT: 24 % — ABNORMAL LOW (ref 39.0–52.0)
Hemoglobin: 13.6 g/dL (ref 13.0–17.0)
Hemoglobin: 8.2 g/dL — ABNORMAL LOW (ref 13.0–17.0)
Hemoglobin: 9.2 g/dL — ABNORMAL LOW (ref 13.0–17.0)
Hemoglobin: 8.8 g/dL — ABNORMAL LOW (ref 13.0–17.0)
Hemoglobin: 8.2 g/dL — ABNORMAL LOW (ref 13.0–17.0)
Hemoglobin: 8.2 g/dL — ABNORMAL LOW (ref 13.0–17.0)
O2 Saturation: 81 %
O2 Saturation: 89 %
O2 Saturation: 85 %
O2 Saturation: 80 %
O2 Saturation: 83 %
O2 Saturation: 82 %
Patient temperature: 36.9
Patient temperature: 36.9
Patient temperature: 36.7
Patient temperature: 36.7
Patient temperature: 36.8
Patient temperature: 36.8
Potassium: 4.6 mmol/L (ref 3.5–5.1)
Potassium: 4.4 mmol/L (ref 3.5–5.1)
Potassium: 5.6 mmol/L — ABNORMAL HIGH (ref 3.5–5.1)
Potassium: 6.2 mmol/L — ABNORMAL HIGH (ref 3.5–5.1)
Potassium: 5.9 mmol/L — ABNORMAL HIGH (ref 3.5–5.1)
Potassium: 5.7 mmol/L — ABNORMAL HIGH (ref 3.5–5.1)
Sodium: 147 mmol/L — ABNORMAL HIGH (ref 135–145)
Sodium: 146 mmol/L — ABNORMAL HIGH (ref 135–145)
Sodium: 146 mmol/L — ABNORMAL HIGH (ref 135–145)
Sodium: 145 mmol/L (ref 135–145)
Sodium: 145 mmol/L (ref 135–145)
Sodium: 145 mmol/L (ref 135–145)
TCO2: 32 mmol/L (ref 22–32)
TCO2: 32 mmol/L (ref 22–32)
TCO2: 32 mmol/L (ref 22–32)
TCO2: 32 mmol/L (ref 22–32)
TCO2: 31 mmol/L (ref 22–32)
TCO2: 30 mmol/L (ref 22–32)
pCO2 arterial: 61.7 mmHg — ABNORMAL HIGH (ref 32.0–48.0)
pCO2 arterial: 55 mmHg — ABNORMAL HIGH (ref 32.0–48.0)
pCO2 arterial: 56 mmHg — ABNORMAL HIGH (ref 32.0–48.0)
pCO2 arterial: 55.1 mmHg — ABNORMAL HIGH (ref 32.0–48.0)
pCO2 arterial: 58.1 mmHg — ABNORMAL HIGH (ref 32.0–48.0)
pCO2 arterial: 55.8 mmHg — ABNORMAL HIGH (ref 32.0–48.0)
pH, Arterial: 7.299 — ABNORMAL LOW (ref 7.350–7.450)
pH, Arterial: 7.346 — ABNORMAL LOW (ref 7.350–7.450)
pH, Arterial: 7.336 — ABNORMAL LOW (ref 7.350–7.450)
pH, Arterial: 7.352 (ref 7.350–7.450)
pH, Arterial: 7.313 — ABNORMAL LOW (ref 7.350–7.450)
pH, Arterial: 7.316 — ABNORMAL LOW (ref 7.350–7.450)
pO2, Arterial: 52 mmHg — ABNORMAL LOW (ref 83.0–108.0)
pO2, Arterial: 60 mmHg — ABNORMAL LOW (ref 83.0–108.0)
pO2, Arterial: 54 mmHg — ABNORMAL LOW (ref 83.0–108.0)
pO2, Arterial: 47 mmHg — ABNORMAL LOW (ref 83.0–108.0)
pO2, Arterial: 53 mmHg — ABNORMAL LOW (ref 83.0–108.0)
pO2, Arterial: 51 mmHg — ABNORMAL LOW (ref 83.0–108.0)

## 2020-09-12 LAB — CBC
HCT: 28.8 % — ABNORMAL LOW (ref 39.0–52.0)
HCT: 29.3 % — ABNORMAL LOW (ref 39.0–52.0)
Hemoglobin: 9.5 g/dL — ABNORMAL LOW (ref 13.0–17.0)
Hemoglobin: 9.2 g/dL — ABNORMAL LOW (ref 13.0–17.0)
MCH: 31 pg (ref 26.0–34.0)
MCH: 30.2 pg (ref 26.0–34.0)
MCHC: 33 g/dL (ref 30.0–36.0)
MCHC: 31.4 g/dL (ref 30.0–36.0)
MCV: 94.1 fL (ref 80.0–100.0)
MCV: 96.1 fL (ref 80.0–100.0)
Platelets: 130 10*3/uL — ABNORMAL LOW (ref 150–400)
Platelets: 127 10*3/uL — ABNORMAL LOW (ref 150–400)
RBC: 3.06 MIL/uL — ABNORMAL LOW (ref 4.22–5.81)
RBC: 3.05 MIL/uL — ABNORMAL LOW (ref 4.22–5.81)
RDW: 15.6 % — ABNORMAL HIGH (ref 11.5–15.5)
RDW: 16 % — ABNORMAL HIGH (ref 11.5–15.5)
WBC: 17.3 10*3/uL — ABNORMAL HIGH (ref 4.0–10.5)
WBC: 19.5 10*3/uL — ABNORMAL HIGH (ref 4.0–10.5)
nRBC: 0 % (ref 0.0–0.2)
nRBC: 0 % (ref 0.0–0.2)

## 2020-09-12 LAB — PROTIME-INR
INR: 2.8 — ABNORMAL HIGH (ref 0.8–1.2)
Prothrombin Time: 28.7 seconds — ABNORMAL HIGH (ref 11.4–15.2)

## 2020-09-12 LAB — FIBRINOGEN: Fibrinogen: 695 mg/dL — ABNORMAL HIGH (ref 210–475)

## 2020-09-12 LAB — LACTIC ACID, PLASMA
Lactic Acid, Venous: 1.1 mmol/L (ref 0.5–1.9)
Lactic Acid, Venous: 1.7 mmol/L (ref 0.5–1.9)

## 2020-09-12 LAB — CULTURE, RESPIRATORY W GRAM STAIN

## 2020-09-12 LAB — ANA W/REFLEX IF POSITIVE: Anti Nuclear Antibody (ANA): NEGATIVE

## 2020-09-12 LAB — GLUCOSE, CAPILLARY
Glucose-Capillary: 128 mg/dL — ABNORMAL HIGH (ref 70–99)
Glucose-Capillary: 151 mg/dL — ABNORMAL HIGH (ref 70–99)
Glucose-Capillary: 158 mg/dL — ABNORMAL HIGH (ref 70–99)
Glucose-Capillary: 162 mg/dL — ABNORMAL HIGH (ref 70–99)
Glucose-Capillary: 181 mg/dL — ABNORMAL HIGH (ref 70–99)
Glucose-Capillary: 96 mg/dL (ref 70–99)

## 2020-09-12 LAB — APTT
aPTT: 69 seconds — ABNORMAL HIGH (ref 24–36)
aPTT: 70 seconds — ABNORMAL HIGH (ref 24–36)

## 2020-09-12 LAB — RHEUMATOID FACTOR: Rheumatoid fact SerPl-aCnc: 37.4 IU/mL — ABNORMAL HIGH (ref <14.0)

## 2020-09-12 LAB — MPO/PR-3 (ANCA) ANTIBODIES
ANCA Proteinase 3: <3.5 U/mL (ref 0.0–3.5)
Myeloperoxidase Abs: <9 U/mL (ref 0.0–9.0)

## 2020-09-12 LAB — LACTATE DEHYDROGENASE: LDH: 1294 U/L — ABNORMAL HIGH (ref 98–192)

## 2020-09-12 MED ORDER — CALCIUM GLUCONATE-NACL 1-0.675 GM/50ML-% IV SOLN
1.0000 g | Freq: Once | INTRAVENOUS | Status: AC
  Administered 2020-09-12: 20:00:00 1000 mg via INTRAVENOUS
  Filled 2020-09-12: qty 50

## 2020-09-12 MED ORDER — SODIUM ZIRCONIUM CYCLOSILICATE 10 G PO PACK
10.0000 g | PACK | Freq: Three times a day (TID) | ORAL | Status: DC
  Administered 2020-09-12: 21:00:00 10 g via ORAL
  Filled 2020-09-12: qty 1

## 2020-09-12 MED ORDER — SODIUM ZIRCONIUM CYCLOSILICATE 10 G PO PACK
10.0000 g | PACK | Freq: Three times a day (TID) | ORAL | Status: AC
  Administered 2020-09-13: 09:00:00 10 g via ORAL
  Filled 2020-09-12: qty 1

## 2020-09-12 MED ORDER — METHYLPREDNISOLONE SODIUM SUCC 125 MG IJ SOLR
60.0000 mg | Freq: Four times a day (QID) | INTRAMUSCULAR | Status: DC
  Administered 2020-09-12 – 2020-09-18 (×24): 60 mg via INTRAVENOUS
  Filled 2020-09-12 (×23): qty 2

## 2020-09-12 MED ORDER — MIDAZOLAM 50MG/50ML (1MG/ML) PREMIX INFUSION: 0.5000 mg/h | INTRAVENOUS | Status: DC

## 2020-09-12 MED ORDER — VANCOMYCIN HCL 10 G IV SOLR
2500.0000 mg | Freq: Once | INTRAVENOUS | Status: AC
  Administered 2020-09-12: 09:00:00 2500 mg via INTRAVENOUS
  Filled 2020-09-12: qty 2500

## 2020-09-12 MED ORDER — METOPROLOL TARTRATE 25 MG/10 ML ORAL SUSPENSION
50.0000 mg | Freq: Two times a day (BID) | ORAL | Status: DC
  Administered 2020-09-12: 50 mg
  Filled 2020-09-12: qty 20

## 2020-09-12 MED ORDER — LORAZEPAM 2 MG/ML IJ SOLN
2.0000 mg | INTRAMUSCULAR | Status: DC | PRN
  Administered 2020-09-12 – 2020-10-05 (×10): 2 mg via INTRAVENOUS
  Filled 2020-09-12 (×11): qty 1

## 2020-09-12 MED ORDER — VANCOMYCIN HCL 1500 MG/300ML IV SOLN
1500.0000 mg | Freq: Two times a day (BID) | INTRAVENOUS | Status: DC
  Administered 2020-09-12: 21:00:00 1500 mg via INTRAVENOUS
  Filled 2020-09-12 (×2): qty 300

## 2020-09-12 MED ORDER — SODIUM CHLORIDE 0.9 % IV SOLN
INTRAVENOUS | Status: DC | PRN
  Administered 2020-09-13 – 2020-09-15 (×2): 500 mL via INTRAVENOUS
  Administered 2020-09-20 – 2020-09-22 (×4): 250 mL via INTRAVENOUS

## 2020-09-12 MED ORDER — DEXMEDETOMIDINE HCL IN NACL 400 MCG/100ML IV SOLN
0.4000 ug/kg/h | INTRAVENOUS | Status: DC
  Administered 2020-09-12: 23:00:00 0.8 ug/kg/h via INTRAVENOUS
  Administered 2020-09-12: 08:00:00 0.4 ug/kg/h via INTRAVENOUS
  Administered 2020-09-12: 12:00:00 0.6 ug/kg/h via INTRAVENOUS
  Administered 2020-09-13 – 2020-09-14 (×6): 0.8 ug/kg/h via INTRAVENOUS
  Filled 2020-09-12: qty 200
  Filled 2020-09-12 (×3): qty 100
  Filled 2020-09-12: qty 200
  Filled 2020-09-12 (×2): qty 100
  Filled 2020-09-12: qty 200
  Filled 2020-09-12: qty 100

## 2020-09-12 MED ORDER — MIDAZOLAM 50MG/50ML (1MG/ML) PREMIX INFUSION
0.5000 mg/h | INTRAVENOUS | Status: DC
  Administered 2020-09-12 – 2020-09-13 (×3): 2 mg/h via INTRAVENOUS
  Administered 2020-09-14 – 2020-09-18 (×22): 10 mg/h via INTRAVENOUS
  Administered 2020-09-19: 16:00:00 8 mg/h via INTRAVENOUS
  Administered 2020-09-19 (×2): 10 mg/h via INTRAVENOUS
  Administered 2020-09-19 – 2020-09-20 (×2): 8 mg/h via INTRAVENOUS
  Filled 2020-09-12 (×32): qty 50

## 2020-09-12 NOTE — Progress Notes (Signed)
ANTICOAGULATION CONSULT NOTE  Pharmacy Consult for bivalirudin Indication: ECMO + bilateral DVTs  No Known Allergies  Patient Measurements: Height: 5\' 9"  (175.3 cm) Weight: 109.9 kg (242 lb 4.6 oz) (one pillow, one blanket) IBW/kg (Calculated) : 70.7  Vital Signs: Temp: 98.42 F (36.9 C) (12/14 0700) Temp Source: Bladder (12/14 0400) BP: 124/44 (12/14 0400) Pulse Rate: 114 (12/14 0700)  Labs: Recent Labs    09/10/20 0344 09/10/20 0346 09/11/20 0503 09/11/20 0509 09/11/20 0952 09/11/20 1229 09/11/20 1615 09/11/20 1617 09/12/20 0352 09/12/20 0403 09/12/20 0551  HGB 8.7*   < > 8.0*   < >  --    < > 9.2*   < > 9.5* 13.6 8.2*  HCT 26.8*   < > 24.0*   < >  --    < > 28.3*   < > 28.8* 40.0 24.0*  PLT 132*   < > 129*  --   --   --  113*  --  130*  --   --   APTT 62*   < > 81*  --  73*  --  71*  --  69*  --   --   LABPROT 28.5*  --  35.5*  --   --   --   --   --  28.7*  --   --   INR 2.8*  --  3.7*  --   --   --   --   --  2.8*  --   --   CREATININE 0.90   < > 0.99  --   --   --  1.16  --  1.27*  --   --    < > = values in this interval not displayed.    Estimated Creatinine Clearance: 76.5 mL/min (A) (by C-G formula based on SCr of 1.27 mg/dL (H)).   Assessment: 87 yom unvaccinated presenting with severe COVID ARDs, intubated on 12/6, continued to have ventilation issues despite NMB and proning - started on VV ECMO on 12/8.  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   aPTT this morning is 69 seconds, within goal range. Hgb 8.2 (received 3 units of PRBC yesterday), plt 130. Fibrinogen 695, LDH went down from 1569 to 1202 last night - now up to 1294. No further bleeding.Thumb noted to be purple - concern if microclots or position of A-line, A-line changed and color improving.   Goal of Therapy:  aPTT 60-70 seconds Monitor platelets by anticoagulation protocol: Yes   Plan:  Continue bivalirudin 0.23 mg/kg/h (dosing wt 103kg) Check aPTT q12h  14/8, PharmD,  [facility] Clinical Pharmacist  Phone: [phone] 09/12/2020 7:22 AM  Please check AMION for all [facility] Pharmacy phone numbers After 10:00 PM, call Main Pharmacy [phone]

## 2020-09-12 NOTE — Progress Notes (Signed)
This chaplain checked in with the PMT NP-Alicia before responding to the PMT consult for ongoing spiritual care for the Pt. wife.   The chaplain understands the PMT relationship involves daily phone calls with the Pt. wife-[name].  The chaplain will be updated by the PMT of the Pt. and family spiritual care needs.

## 2020-09-12 NOTE — Progress Notes (Addendum)
Palliative:  Mr. Cheever goes by "[name]" not Junior and not Clark (he hates these names per wife).   HPI: 59 y.o.malewith a pertinent history ofhypothyroidism on levothyroxine and hyperlipidemia who was diagnosed with Covid 1 week ago and is not vaccinated. With EMS he was seen to bedesaturatingto 78% on room air. He states he started having symptoms on Friday and tested positive on Saturday.Had initially elected to be DNAR without intubation though later changed this and got intubated on 12/6 in the setting of worsening respiratory failure with hypoxia. CTA chest w/diffuse bilateral infiltrates.On 12/8 was cannulated for VV ECMO.Had been extubated on 12/9 then emergently reintubated in early eveningdue to clinical instability. Palliative care was asked to get involved in the setting of VV ECMO to set goals and expectations with [name]'s family. He continues on VV ECMO and ventilator support. ECMO circuit change 09/11/20.    I met today with ECMO team outside [name]'s room. He continues to be sedated on vent 40% FiO2 and tolerating well. Hypotension overnight and this is improved today and off vasopressors. There is some concern for right thumb discoloration but not yet noted to be necrotic. PCXR worse. Staph aureus growing in sputum with plans for antibiotic changes. Plans for potential plasmapheresis tomorrow. Circuit change went well yesterday with improvement in flows. Also discussed likely need for tracheostomy early next week.   I called and provided update to wife Donald Rowe. I reported all the above changes. Unfortunately not much good news today. He continues to be critically ill. Donald Rowe has many good insightful questions which I answered to the best of my ability. Donald Rowe does report that [name]'s daughter, Donald Rowe has many questions and concerns and she attempts to answer. I do see in records that Dr. Smith has spoken with daughter so hopefully she has received the answers she is seeking. Donald Rowe also  asks about date of removing precautions and I do see +COVID results for 11/27 in Care Everywhere which would make 12/18 day 21 so may consider removing precautions at this time and will address with team.   All questions/concerns addressed. Emotional support provided.   Exam: Sedated on vent. VV ECMO. Cortrak in place. No distress. 40% FiO2 and tolerating vent with no dyssynchrony. Abd flat.   Plan: - Continue full aggressive care with hopes of eventual improvement.  - I will continue to follow and support.   50 min   , NP Palliative Medicine Team Pager [phone] (Please see [email or web address] for schedule) Team Phone [phone]    Greater than 50%  of this time was spent counseling and coordinating care related to the above assessment and plan  

## 2020-09-12 NOTE — Progress Notes (Signed)
Updated daughter and all questions answered.  Myrla Halsted MD PCCM

## 2020-09-12 NOTE — Progress Notes (Addendum)
Pharmacy Antibiotic Note  Donald Rowe is a 59 y.o. male admitted on 09/15/2020 with pneumonia.  Pharmacy has been consulted for vancomycin dosing.  Completing day 7 of cefepime today. WBC continues to increase (17.3 today). Scr trending up to 1.27 (holding lasix today). LA down to 1.1. Afebrile. Sed rate 65, PCT 3.32, CRP 25.4. CXR still showing severe diffuse infiltrates - flows improved with LDH only down marginally since circuit change.   Plan: Vancomycin 2500 mg IV once then 1250 mg IV every 12 hours  Monitor cx results, clinical pic, renal fx, and VT at SS  Height: 5\' 9"  (175.3 cm) Weight: 109.9 kg (242 lb 4.6 oz) (one pillow, one blanket) IBW/kg (Calculated) : 70.7  Temp (24hrs), Avg:98.4 F (36.9 C), Min:97.34 F (36.3 C), Max:98.6 F (37 C)  Recent Labs  Lab 09/09/20 1708 09/10/20 0344 09/10/20 1738 09/11/20 0503 09/11/20 1615 09/11/20 1618 09/12/20 0352  WBC  --  10.1 11.1* 15.1* 12.7*  --  17.3*  CREATININE  --  0.90 0.97 0.99 1.16  --  1.27*  LATICACIDVEN 1.6 1.3  --  1.4  --  1.7 1.1    Estimated Creatinine Clearance: 76.5 mL/min (A) (by C-G formula based on SCr of 1.27 mg/dL (H)).    No Known Allergies  Antimicrobials this admission: Vancomycin 12/14 >>  Cefepime 12/7 >>  Ceftriaxone 12/5>> 12/6  Dose adjustments this admission: N/A  Microbiology results: 12/4 BCx: ngtd 12/12 Sputum: rare MRSA   12/5 MRSA PCR: neg  Thank you for allowing pharmacy to be a part of this patient's care.  14/5, PharmD, BCCCP Clinical Pharmacist  Phone: [phone] 09/12/2020 12:05 PM  Please check AMION for all [facility] Pharmacy phone numbers After 10:00 PM, call Main Pharmacy [phone]

## 2020-09-12 NOTE — Progress Notes (Signed)
Late Entry: Wasted and witnessed 67mL Fentanyl with Deretha Emory RN in sink d/t change in IV tubing.

## 2020-09-12 NOTE — Progress Notes (Signed)
Patient ID: Donald Rowe, male   DOB: 1960/11/04, 59 y.o.   MRN: [id number]    Advanced Heart Failure Rounding Note   Subjective:    12/8 Cannulated for VV ECMO - [place] 12/9 Extubated/Reintubated for altered mental status 12/13 Circuit change for elevated LDH  Circuit changed yesterday.  LDH went down a bit now back up. BP dropped into 60s last night was on NE and now off.  Staph aureus growing in sputum.   Remains sedated on propofol. Will get agitated. Not following commands when sedation lightened,.  ECMO  Speed 3600 -> 4100 -> 3900 Flow 4.8L -> 5.0L -> 4.9 Sweep 6 -> 5 -> 6 dP 23 -> 25 pVen  -99  ABG: 7.34/46/61/92% Hgb 7.8 -> 8.0 LDH 369 -> 447 -> 743 -> 1081 -> 1569 -> 1202 -> 1294 Lactic acid 1.1 PTT 69  Vent 40% TVs 400 CXR: worsening diffuse bilateral infiltrates. Cannula position slightly high. Personally reviewed     Objective:   Weight Range:  Vital Signs:   Temp:  [97.34 F (36.3 C)-98.6 F (37 C)] 98.42 F (36.9 C) (12/14 0700) Pulse Rate:  [100-118] 114 (12/14 0700) Resp:  [17-35] 18 (12/14 0700) BP: (102-174)/(43-54) 124/44 (12/14 0400) SpO2:  [83 %-100 %] 96 % (12/14 0700) Arterial Line BP: (76-172)/(31-53) 128/50 (12/14 0700) FiO2 (%):  [70 %-100 %] 70 % (12/14 0409) Weight:  [109.9 kg] 109.9 kg (12/14 0407) Last BM Date: 09/11/20  Weight change: Filed Weights   09/11/20 0500 09/11/20 0600 09/12/20 0407  Weight: 96.5 kg (S) 107.8 kg 109.9 kg    Intake/Output:   Intake/Output Summary (Last 24 hours) at 09/12/2020 0737 Last data filed at 09/12/2020 0700 Gross per 24 hour  Intake 8784.36 ml  Output 3615 ml  Net 5169.36 ml     Physical Exam: General:  Intubated/sedated No resp difficulty HEENT: normal + ETT Neck: supple. RIJ ECMO. Carotids 2+ bilat; no bruits. No lymphadenopathy or thryomegaly appreciated. Cor: PMI nondisplaced. Tachy regular Lungs: bronchial Abdomen: obese soft, nontender, nondistended. No  hepatosplenomegaly. No bruits or masses. Good bowel sounds. Extremities: no cyanosis, clubbing, rash, tr edema  Dusky R thumb Neuro: intubated sedated    Telemetry: sinus 100-120 Personally reviewed    Labs: Basic Metabolic Panel: Recent Labs  Lab 09/07/2020 0334 09/03/2020 0846 09/07/20 0438 09/07/20 0620 09/08/20 0343 09/08/20 0351 09/09/20 0458 09/09/20 0501 09/10/20 0344 09/10/20 0346 09/10/20 1738 09/10/20 1948 09/11/20 0503 09/11/20 0509 09/11/20 1615 09/11/20 1617 09/11/20 2014 09/12/20 0352 09/12/20 0403 09/12/20 0551  NA 139   < > 144  145   < > 146*   < > 148*   < > 149*   < > 146*   < > 147*   < > 146* 147* 148* 147* 147* 146*  K 4.9   < > 4.3  4.3   < > 4.4   < > 4.4   < > 4.0   < > 3.9   < > 4.2   < > 3.9 3.9 3.8 4.7 4.6 4.4  CL 107   < > 112*   < > 116*   < > 118*   < > 116*  --  111  --  111  --  111  --   --  111  --   --   CO2 25   < > 26   < > 23   < > 25   < > 27  --  28  --  28  --  25  --   --  28  --   --   GLUCOSE 253*   < > 214*   < > 231*   < > 231*   < > 146*  --  151*  --  162*  --  142*  --   --  133*  --   --   BUN 41*   < > 40*   < > 40*   < > 40*   < > 41*  --  42*  --  46*  --  51*  --   --  55*  --   --   CREATININE 1.25*   < > 1.22   < > 1.04   < > 0.94   < > 0.90  --  0.97  --  0.99  --  1.16  --   --  1.27*  --   --   CALCIUM 8.4*   < > 7.9*   < > 7.5*   < > 7.9*   < > 7.8*  --  8.0*  --  7.9*  --  7.7*  --   --  8.0*  --   --   MG 2.9*  --  2.5*  --  2.8*  --  2.7*  --   --   --   --   --   --   --   --   --   --   --   --   --   PHOS 4.3  --  2.0*  --  1.8*  --  2.1*  --   --   --   --   --   --   --  3.4  --   --   --   --   --    < > = values in this interval not displayed.    Liver Function Tests: Recent Labs  Lab 09/08/20 0343 09/09/20 0458 09/10/20 0344 09/11/20 0503 09/12/20 0352  AST 42* 73* 70* 69* 52*  ALT 34 60* 61* 44 36  ALKPHOS 67 79 102 128* 137*  BILITOT 1.2 1.9* 2.0* 3.7* 2.2*  PROT 4.3* 4.5* 4.3* 4.2* 4.7*   ALBUMIN 2.3* 2.3* 2.0* 1.7* 1.9*   No results for input(s): LIPASE, AMYLASE in the last 168 hours. No results for input(s): AMMONIA in the last 168 hours.  CBC: Recent Labs  Lab 09/25/2020 0334 09/05/2020 0846 09/07/20 0438 09/07/20 0620 09/10/20 0344 09/10/20 0346 09/10/20 1738 09/10/20 1948 09/11/20 0503 09/11/20 0509 09/11/20 1615 09/11/20 1617 09/11/20 2014 09/12/20 0352 09/12/20 0403 09/12/20 0551  WBC 14.5*   < > 9.0   < > 10.1  --  11.1*  --  15.1*  --  12.7*  --   --  17.3*  --   --   NEUTROABS 12.0*  --  7.7  --   --   --   --   --   --   --   --   --   --   --   --   --   HGB 13.8   < > 10.4*  9.5*   < > 8.7*   < > 8.8*   < > 8.0*   < > 9.2* 8.8* 8.5* 9.5* 13.6 8.2*  HCT 43.9   < > 31.3*  28.0*   < > 26.8*   < > 27.6*   < > 24.0*   < >  28.3* 26.0* 25.0* 28.8* 40.0 24.0*  MCV 97.6   < > 94.8   < > 96.1  --  95.5  --  94.9  --  94.0  --   --  94.1  --   --   PLT 173   < > 141*   < > 132*  --  134*  --  129*  --  113*  --   --  130*  --   --    < > = values in this interval not displayed.    Cardiac Enzymes: No results for input(s): CKTOTAL, CKMB, CKMBINDEX, TROPONINI in the last 168 hours.  BNP: BNP (last 3 results) No results for input(s): BNP in the last 8760 hours.  ProBNP (last 3 results) No results for input(s): PROBNP in the last 8760 hours.    Other results:  Imaging: DG CHEST PORT 1 VIEW  Result Date: 09/12/2020 CLINICAL DATA:  COVID.  ECMO. EXAM: PORTABLE CHEST 1 VIEW COMPARISON:  09/11/2020. FINDINGS: Endotracheal tube, feeding tube, left IJ line, ECMO device in stable position. Progressive dense bilateral pulmonary infiltrates with near complete opacification of both lungs. Bilateral pleural effusions cannot be excluded. No pneumothorax. Heart size cannot be assessed. No acute bony abnormality. IMPRESSION: 1. Lines and tube including ECMO device s in stable position. 2. Progressive dense bilateral pulmonary infiltrates with near complete  opacification of both lungs. Electronically Signed   By: Marcello Moores  Register   On: 09/12/2020 07:01   DG CHEST PORT 1 VIEW  Result Date: 09/11/2020 CLINICAL DATA:  Hypoxia.  ECMO treatment EXAM: PORTABLE CHEST 1 VIEW COMPARISON:  September 10, 2020 FINDINGS: Endotracheal tube tip is 5.8 cm above the carina. Central catheter tip is in the superior vena cava. Feeding tube tip is below the diaphragm. ECMO catheter tip is in the inferior vena cava. No pneumothorax. Widespread airspace opacity persists bilaterally with underlying pleural effusions. There is stable cardiomegaly with pulmonary vascularity within normal limits. No adenopathy. No bone lesions. IMPRESSION: Tube and catheter positions as described without pneumothorax. Small pleural effusions bilaterally with widespread airspace opacity bilaterally, stable. Stable cardiac prominence. Electronically Signed   By: Lowella Grip III M.D.   On: 09/11/2020 08:03     Medications:     Scheduled Medications: . sodium chloride   Intravenous Once  . sodium chloride   Intravenous Once  . sodium chloride   Intravenous Once  . aspirin  81 mg Per Tube Daily  . chlorhexidine gluconate (MEDLINE KIT)  15 mL Mouth Rinse BID  . Chlorhexidine Gluconate Cloth  6 each Topical Daily  . clonazePAM  1 mg Per Tube BID  . docusate  100 mg Per Tube BID  . feeding supplement (PIVOT 1.5 CAL)  1,000 mL Per Tube Q24H  . feeding supplement (PROSource TF)  45 mL Per Tube QID  . fiber  1 packet Per Tube BID  . free water  200 mL Per Tube Q2H  . insulin aspart  2-6 Units Subcutaneous Q4H  . insulin aspart  8 Units Subcutaneous Q4H  . insulin detemir  24 Units Subcutaneous Q12H  . levothyroxine  50 mcg Per Tube Q0600  . mouth rinse  15 mL Mouth Rinse 10 times per day  . metoprolol tartrate  100 mg Per Tube BID  . oxyCODONE  5 mg Per Tube Q6H  . pantoprazole sodium  40 mg Per Tube Daily  . potassium chloride  20 mEq Per Tube BID  . pravastatin  40 mg  Per Tube q1800   . QUEtiapine  100 mg Per Tube BID  . sodium chloride flush  10-40 mL Intracatheter Q12H  . valproic acid  500 mg Per Tube BID    Infusions: . sodium chloride 10 mL/hr at 09/12/20 0700  . albumin human Stopped (09/11/20 1900)  . bivalirudin (ANGIOMAX) infusion 0.5 mg/mL (Non-ACS indications) 0.23 mg/kg/hr (09/12/20 0700)  . ceFEPime (MAXIPIME) IV Stopped (09/12/20 0010)  . dextrose    . fentaNYL infusion INTRAVENOUS 200 mcg/hr (09/12/20 0700)  . norepinephrine (LEVOPHED) Adult infusion 1 mcg/min (09/12/20 0700)  . propofol (DIPRIVAN) infusion 40 mcg/kg/min (09/12/20 0700)    PRN Medications: sodium chloride, acetaminophen (TYLENOL) oral liquid 160 mg/5 mL, albumin human, dextrose, fentaNYL, labetalol, midazolam, ondansetron **OR** ondansetron (ZOFRAN) IV, polyethylene glycol, sodium chloride flush   Assessment/Plan:   1. Acute hypoxic respiratory failure/ARDS in setting of COVID-19 PNA - Cannulated for VV ECMO on 12/8 after failing intubation x 2 days - has complete remedisivir, baricitinib, Solumedrol.  - continue abx (cefepime) - stop dates in place  - staph in tracheal aspirate - add vanc today (12/14) - circuit changed 12/13 for elevated LDH and lower flows. LDH down only marginally. Flows improved - no bleeding. Will increase steroids today. If LDH continues to climb. - CXR with severe diffuse infiltrates. Cannula position a bit high but no evidence of recirculation - PTT 69 Dosing d/w PharmD - Driving pressure 12  2. Bilateral DVT - remains on bival  3. DM2 - on insulin gtt. adjusted  4. Obesity -Body mass index is 33.73 kg/m.  5. F/E/N - Na 147 - Continue free water  6. HTN - Continue metoprolol 50 mg bid.   7. Dusky R thumb - likely embolic. Switch A line  Plan discussed on multi-discplinary ECMO rounds with CCM, ECMO coordinator/specialist, PharmDs and RNs.  CRITICAL CARE Performed by: Glori Bickers  Total critical care time: 40  minutes  Critical care time was exclusive of separately billable procedures and treating other patients.  Critical care was necessary to treat or prevent imminent or life-threatening deterioration.  Critical care was time spent personally by me (independent of midlevel providers or residents) on the following activities: development of treatment plan with patient and/or surrogate as well as nursing, discussions with consultants, evaluation of patient's response to treatment, examination of patient, obtaining history from patient or surrogate, ordering and performing treatments and interventions, ordering and review of laboratory studies, ordering and review of radiographic studies, pulse oximetry and re-evaluation of patient's condition.    Length of Stay: 10   Glori Bickers MD 09/12/2020, 7:37 AM  Advanced Heart Failure Team Pager [phone] (M-F; [place])  Please contact [facility] for night-coverage after hours (4p -7a ) and weekends on [email or web address]

## 2020-09-12 NOTE — Plan of Care (Signed)
  Problem: Education: Goal: Knowledge of risk factors and measures for prevention of condition will improve Outcome: Not Progressing   Problem: Respiratory: Goal: Will maintain a patent airway Outcome: Progressing Goal: Complications related to the disease process, condition or treatment will be avoided or minimized Outcome: Progressing   Problem: Fluid Volume: Goal: Hemodynamic stability will improve Outcome: Progressing   Problem: Respiratory: Goal: Ability to maintain adequate ventilation will improve Outcome: Progressing   Problem: Nutrition: Goal: Adequate nutrition will be maintained Outcome: Progressing   Problem: Coping: Goal: Level of anxiety will decrease Outcome: Not Progressing   Problem: Elimination: Goal: Will not experience complications related to bowel motility Outcome: Progressing Goal: Will not experience complications related to urinary retention Outcome: Progressing   Problem: Pain Managment: Goal: General experience of comfort will improve Outcome: Progressing   Problem: Safety: Goal: Ability to remain free from injury will improve Outcome: Progressing   Problem: Skin Integrity: Goal: Risk for impaired skin integrity will decrease Outcome: Progressing

## 2020-09-12 NOTE — Progress Notes (Signed)
ANTICOAGULATION CONSULT NOTE  Pharmacy Consult for bivalirudin Indication: ECMO + bilateral DVTs  No Known Allergies  Patient Measurements: Height: 5\' 9"  (175.3 cm) Weight: 109.9 kg (242 lb 4.6 oz) (one pillow, one blanket) IBW/kg (Calculated) : 70.7  Vital Signs: Temp: 98.42 F (36.9 C) (12/14 1600) Temp Source: Bladder (12/14 1600) Pulse Rate: 111 (12/14 1600)  Labs: Recent Labs    09/10/20 0344 09/10/20 0346 09/11/20 0503 09/11/20 0509 09/11/20 1615 09/11/20 1617 09/12/20 0352 09/12/20 0403 09/12/20 0551 09/12/20 1551  HGB 8.7*   < > 8.0*   < > 9.2*   < > 9.5* 13.6 8.2* 9.2*  HCT 26.8*   < > 24.0*   < > 28.3*   < > 28.8* 40.0 24.0* 29.3*  PLT 132*   < > 129*  --  113*  --  130*  --   --  127*  APTT 62*   < > 81*   < > 71*  --  69*  --   --  70*  LABPROT 28.5*  --  35.5*  --   --   --  28.7*  --   --   --   INR 2.8*  --  3.7*  --   --   --  2.8*  --   --   --   CREATININE 0.90   < > 0.99  --  1.16  --  1.27*  --   --   --    < > = values in this interval not displayed.    Estimated Creatinine Clearance: 76.5 mL/min (A) (by C-G formula based on SCr of 1.27 mg/dL (H)).   Assessment: 35 yom unvaccinated presenting with severe COVID ARDs, intubated on 12/6, continued to have ventilation issues despite NMB and proning - started on VV ECMO on 12/8.  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   aPTT this evening is 70 seconds, within goal range. Hgb 9.2 (received 3 units of PRBC 12/13), plt 127 stable. Fibrinogen 695, LDH went down from 1569 to 1202 last night - now up to 1294. Thumb noted to be purple this AM - concern if microclots or position of A-line, A-line changed and color improving. No further issues noted this evening per discussion with RN.  Goal of Therapy:  aPTT 60-70 seconds Monitor platelets by anticoagulation protocol: Yes   Plan:  Continue bivalirudin at 0.23 mg/kg/h (dosing wt 103kg) Check aPTT q12h Monitor CBC, s/sx bleeding   1/14, PharmD, BCPS Please check AMION for all [facility] contact numbers Clinical Pharmacist 09/12/2020 4:49 PM

## 2020-09-12 NOTE — Procedures (Signed)
Arterial Catheter Insertion Procedure Note  Tedrick Port  [id number]  September 12, 1961  Date:09/12/20  Time:10:14 AM    Provider Performing: Lorin Glass    Procedure: Insertion of Arterial Line (30160) with US guidance (10932)   Indication(s) Blood pressure monitoring and/or need for frequent ABGs  Consent Risks of the procedure as well as the alternatives and risks of each were explained to the patient and/or caregiver.  Consent for the procedure was obtained and is signed in the bedside chart  Anesthesia None   Time Out Verified patient identification, verified procedure, site/side was marked, verified correct patient position, special equipment/implants available, medications/allergies/relevant history reviewed, required imaging and test results available.   Sterile Technique Maximal sterile technique including full sterile barrier drape, hand hygiene, sterile gown, sterile gloves, mask, hair covering, sterile ultrasound probe cover (if used).   Procedure Description Area of catheter insertion was cleaned with chlorhexidine and draped in sterile fashion. With real-time ultrasound guidance an arterial catheter was placed into the left radial artery.  Appropriate arterial tracings confirmed on monitor.     Complications/Tolerance None; patient tolerated the procedure well.   EBL Minimal   Specimen(s) None

## 2020-09-12 NOTE — Procedures (Signed)
Extracorporeal support note   ECLS cannulation: 09/10/2020 Last Circuit Change: 09/11/20 Indication: COVD ARDS  Configuration: VV, RIJ 32Fr [place]  Pump speed: 300 Pump flow: 4.87 Pump used: Cardiohelp  Sweep gas: 100%,6LPM  Circuit check: no clots Anticoagulant: bivalirudin Anticoagulation targets: PTT 50-70    Changes in support: May need to start PLEX today for ongoing hemolysis, consider bronchoscopy  Anticipated goals/duration of support: bridge to recovery  Myrla Halsted MD PCCM  09/12/2020, 7:31 AM

## 2020-09-12 NOTE — Evaluation (Signed)
Occupational Therapy Evaluation Patient Details Name: Donald Rowe MRN: [id number] DOB: [birth year] Today's Date: 09/12/2020    History of Present Illness 59 y.o. male with a pertinent history of hypothyroidism on levothyroxine and hyperlipidemia who was diagnosed with Covid 11/27 and is not vaccinated.  With EMS SaO2 on RA 78%O2. ED patient was increased to 6 L oxygen satting about 92%,. CTA of the Chest was assessed and negative for pulmonary embolism but showed diffuse bilateral infiltrates. He was treated with IV remdesivir, steroids and baricitinib.  Initially, he was also treated with antibiotics but these were stopped on 12/6 with a low procalcitonin. 12/07 intubated, central line, A line, 12/08 ECMO cannulation, cortrak, attempted extubation 12/9 failed reintubated.   Clinical Impression   PTA, pt was living with his wife and was independent; information from chart review as pt unable to provide information. Pt currently requiring Total A for ADLs and bed mobility. Session completed with RN and ECMO specialist present. Focused session on upright positioning into semi standing with use of Kreg bed. Pt tolerating upright standing posture at 30-40* for ~15 minutes. Pt with increased coughing and L knee buckling at 40* and needing to return to 30*. Facilitating PROM at BUE/BLE. Vitals signs good throughout session: SpO2 97% on vent, HR 110s, and ART line 150/60s. Pt would benefit from further acute OT to facilitate safe dc. Pending progress, recommend dc to CIR for further OT to optimize safety, independence with ADLs, and return to PLOF.     Follow Up Recommendations  CIR ; pending progress   Equipment Recommendations  Other (comment) (Defer to nexr venue and pending progress)    Recommendations for Other Services PT consult;Rehab consult;Speech consult     Precautions / Restrictions Precautions Precaution Comments: COVID+, ECMO, cortrak, intubated, flexiseal, condom cath       Mobility Bed Mobility               General bed mobility comments: Total A +2 for reposition in bed. Use of krege tilt bed for semi standing. +2 to manage line and safety    Transfers                 General transfer comment: Use of kreg bed for semi standing positon at 30*. Attempting 40*, however, left knee buckling and requiring additional support. Return to 30*. Maintaing semi standing position for ~15 minutes    Balance                                           ADL either performed or assessed with clinical judgement   ADL Overall ADL's : Needs assistance/impaired                                       General ADL Comments: Total A for ADLs     Vision         Perception     Praxis      Pertinent Vitals/Pain Pain Assessment: Faces Faces Pain Scale: Hurts a little bit Pain Location: Generalized Pain Descriptors / Indicators: Grimacing Pain Intervention(s): Monitored during session;Repositioned;RN gave pain meds during session;Other (comment) (Sedated)     Hand Dominance     Extremity/Trunk Assessment Upper Extremity Assessment Upper Extremity Assessment: RUE deficits/detail;LUE deficits/detail RUE Deficits / Details: Edema. PROM WFL LUE  Deficits / Details: Edema. PROM WFL   Lower Extremity Assessment Lower Extremity Assessment: Defer to PT evaluation       Communication     Cognition Arousal/Alertness: Suspect due to medications (sedated) Behavior During Therapy: Flat affect Overall Cognitive Status: Difficult to assess                                     General Comments  RN and ECMO specialist present throughout. SpO2 97%on vent. HR 110s. BP stable via ART line.    Exercises Exercises: General Upper Extremity;General Lower Extremity General Exercises - Upper Extremity Shoulder Flexion: PROM;Both;5 reps;Supine Shoulder ABduction: PROM;Both;5 reps;Supine Shoulder ADduction:  PROM;Both;5 reps;Supine Elbow Flexion: PROM;Both;5 reps;Supine Elbow Extension: PROM;Both;5 reps;Supine Wrist Flexion: PROM;5 reps;Both;Supine Wrist Extension: PROM;Both;5 reps;Supine Digit Composite Flexion: PROM;Both;5 reps;Supine Composite Extension: PROM;Both;5 reps;Supine General Exercises - Lower Extremity Ankle Circles/Pumps: PROM;Both;5 reps;Supine Hip Flexion/Marching: PROM;Both;5 reps;Supine   Shoulder Instructions      Home Living Family/patient expects to be discharged to:: Private residence Living Arrangements: Spouse/significant other;Children                               Additional Comments: Pt unable to provide information. Per chart lives with wife      Prior Functioning/Environment          Comments: Per chart, independent        OT Problem List: Decreased strength;Decreased range of motion;Decreased activity tolerance;Impaired balance (sitting and/or standing);Decreased cognition;Decreased safety awareness;Decreased knowledge of use of DME or AE;Decreased knowledge of precautions;Cardiopulmonary status limiting activity;Impaired UE functional use;Pain      OT Treatment/Interventions: Self-care/ADL training;Therapeutic exercise;Energy conservation;DME and/or AE instruction;Therapeutic activities;Patient/family education    OT Goals(Current goals can be found in the care plan section) Acute Rehab OT Goals Patient Stated Goal: Unstated OT Goal Formulation: Patient unable to participate in goal setting Time For Goal Achievement: 09/26/20 Potential to Achieve Goals: Good  OT Frequency: Min 2X/week   Barriers to D/C:            Co-evaluation              AM-PAC OT "6 Clicks" Daily Activity     Outcome Measure Help from another person eating meals?: Total Help from another person taking care of personal grooming?: Total Help from another person toileting, which includes using toliet, bedpan, or urinal?: Total Help from another  person bathing (including washing, rinsing, drying)?: Total Help from another person to put on and taking off regular upper body clothing?: Total Help from another person to put on and taking off regular lower body clothing?: Total 6 Click Score: 6   End of Session Nurse Communication: Other (comment) (RN present throughout session)  Activity Tolerance: Patient limited by fatigue;Patient limited by lethargy Patient left: in bed;with call bell/phone within reach;with nursing/sitter in room  OT Visit Diagnosis: Unsteadiness on feet (R26.81);Other abnormalities of gait and mobility (R26.89);Muscle weakness (generalized) (M62.81);Pain Pain - part of body:  (Generalized)                Time: 7829-5621 OT Time Calculation (min): 38 min Charges:  OT General Charges $OT Visit: 1 Visit OT Evaluation $OT Eval High Complexity: 1 High OT Treatments $Therapeutic Activity: 23-37 mins  Donald Rowe MSOT, OTR/L Acute Rehab Pager: [phone] Office: [phone]  Donald Rowe Donald Rowe 09/12/2020, 2:55 PM

## 2020-09-12 NOTE — Progress Notes (Signed)
Calcium/lokelma ordered for mild hyperkalemia.

## 2020-09-12 NOTE — Progress Notes (Signed)
NAME:  Donald Rowe, MRN:  [id number], DOB:  [birth year], LOS: 10 ADMISSION DATE:  08/31/2020, CONSULTATION DATE:  12/6 REFERRING MD:  Dr. David Stall, CHIEF COMPLAINT:  SOB    Brief History   59 y/o M admitted 12/4 with 1 week hx of SOB, known COVID positive.  He is unvaccinated.  CTA chest negative for PE but demonstrated diffuse bilateral infiltrates.   History of present illness   59 y/o unvaccinated male, admitted 12/4 with a one week history of shortness of breath.    The patient began feeling poorly the day before Thanksgiving with fever, weakness and body aches. He tested positive for COVID on 12/27 prior to presentation.  He developed progressive shortness of breath.  In the ER, the patient was found to have saturations of 78%.  He was started on 6L O2 with initial improvement in saturations.  The patient was admitted per [facility]. CTA of the Chest was assessed and negative for pulmonary embolism but showed diffuse bilateral infiltrates. He was treated with IV remdesivir, steroids and baricitinib.  Initially, he was also treated with antibiotics but these were stopped on 12/6 with a low procalcitonin.  LE doppler negative for DVT. The patient elected to be DNR / no intubation.  He developed worsening respiratory failure with hypoxia requiring 60L flow 100% and PCCM consulted 12/6 for evaluation.   Past Medical History  Hypothyroidism  HLD   Significant Hospital Events/Procedures  12/04 Admit  12/06 PCCM consulted  12/07 Intubated, central line, a line 12/08 VV ECMO Cannulaton, cortrak 12/13 circuit change for hemolytic anemia  Consults:  PCCM, Heart failure, TCTS  Significant Diagnostic Tests:   CTA Chest 12/4 >> extensive bilateral airspace disease, no large PE identified, limited study   LE Venous Duplex 12/4 >> negative for DVT bilaterally   Micro Data:  COVID 12/4 >> negative  Influenza A/B 12/4 >> negative  MRSA PCR 12/5 >> negative  BCx2 12/4 >>   Antimicrobials:   Azithromycin 12/5 >> 12/6  Ceftriaxone 12/5 >> 12/6   Interim history/subjective:  CXR worse. Ongoing high LDH  Objective   Blood pressure (!) 124/44, pulse (!) 114, temperature 98.42 F (36.9 C), resp. rate 18, height 5\' 9"  (1.753 m), weight 109.9 kg, SpO2 96 %.    Vent Mode: PCV FiO2 (%):  [70 %-100 %] 70 % Set Rate:  [12 bmp-1212 bmp] 12 bmp Vt Set:  [400 mL] 400 mL PEEP:  [10 cmH20] 10 cmH20 Plateau Pressure:  [20 cmH20-22 cmH20] 22 cmH20   Intake/Output Summary (Last 24 hours) at 09/12/2020 0734 Last data filed at 09/12/2020 0700 Gross per 24 hour  Intake 8784.36 ml  Output 3615 ml  Net 5169.36 ml   Filed Weights   09/11/20 0500 09/11/20 0600 09/12/20 0407  Weight: 96.5 kg (S) 107.8 kg 109.9 kg    Examination: Constitutional: heavily sedated man on vent  Eyes: pupils pinpoint, reactive Ears, nose, mouth, and throat: ETT with small bloody secretions Cardiovascular: Tachycardic, ext warm Respiratory: Remains pretty clear anteriorly, occasional accessory muscle use Gastrointestinal: Soft, +BS Skin: No rashes, normal turgor, has ischemic change of R thumb Neurologic: heavily sedated, moves all 4 ext to pan with weaning Psychiatric: RASS -1   Plats remain low, TV remain good ABG looks good on 6 sweep Fibrinogen 326>>408>>539>>695 INR 2.8>>3.7>>2.8 H/H stable after 3 units LDH 743>>1081>>1569>>circuit change>>1294 Plts improved CBGs good BUN/Cr up a bit CXR worse  Resolved Hospital Problem list      Assessment & Plan:  Critically ill due to acute hypoxemic and hypercarbic respiratory failure secondary to COVID PNA with severe ARDS. S/p VV ECMO cannulation 09/07/20.  Completed remdesivir.  On baricitinib.  Completed CAP coverage. Critically ill due to agitated delirium requiring titration of IV sedation. Elevated Pct question superimposed CAP Bilateral lower ext DVTs- assocated with COVID infection Steroid-induced hyperglycemia Hypothyroidism, HLD- on  home meds Hemolysis- ongoing, circuit changed, has plateau'd somewhat.  Has staph in sputum which could be contributing.  Rheumatologic-associated MAHA remains in differential.  Staph tracheiitis vs. HCAP- sensitivities pending  - Today, start vanc, steroids, if ongoing hemolysis tomorrow start PLEX - Hold lasix today - Watch blood sugars with steroid initiaton - Continue vent support, VAP prevention bundle - Continue depakote, seroquel, klonipin - Try to wean from propofol to precedex, okay for PRN ativan, continue fentanyl - Drop metoprolol a bit with severe drops in Bps after administration, suspect this will even out with propofol reduction - Palliative to update family today  Best practice (evaluated daily)  Diet: TF Pain/Anxiety/Delirium protocol (if indicated): as above VAP protocol (if indicated): yes DVT prophylaxis: Bivalirudin GI prophylaxis: pantoprazole  Glucose control: Subcutaneous insulin basal bolus with good control.   Mobility: bedrest Last date of multidisciplinary goals of care discussion: wife updated over the phone 12/11 Family and staff present RN, physicain Summary of discussion con't aggressive care Follow up goals of care discussion due: 12/18 Code Status: full Disposition: ICU   I personally spent 34 minutes providing critical care not including any separately billable procedures  Myrla Halsted MD [facility] 09/12/2020 8:01 AM Personal pager: [phone] If unanswered, please page CCM On-call: [phone]

## 2020-09-13 ENCOUNTER — Inpatient Hospital Stay (HOSPITAL_COMMUNITY): Payer: HRSA Program

## 2020-09-13 DIAGNOSIS — N179 Acute kidney failure, unspecified: Secondary | ICD-10-CM

## 2020-09-13 DIAGNOSIS — J9601 Acute respiratory failure with hypoxia: Secondary | ICD-10-CM | POA: Diagnosis not present

## 2020-09-13 DIAGNOSIS — U071 COVID-19: Secondary | ICD-10-CM | POA: Diagnosis not present

## 2020-09-13 LAB — BASIC METABOLIC PANEL
Anion gap: 10 (ref 5–15)
Anion gap: 8 (ref 5–15)
Anion gap: 6 (ref 5–15)
BUN: 75 mg/dL — ABNORMAL HIGH (ref 6–20)
BUN: 77 mg/dL — ABNORMAL HIGH (ref 6–20)
BUN: 80 mg/dL — ABNORMAL HIGH (ref 6–20)
CO2: 26 mmol/L (ref 22–32)
CO2: 25 mmol/L (ref 22–32)
CO2: 26 mmol/L (ref 22–32)
Calcium: 8.4 mg/dL — ABNORMAL LOW (ref 8.9–10.3)
Calcium: 8 mg/dL — ABNORMAL LOW (ref 8.9–10.3)
Calcium: 8.1 mg/dL — ABNORMAL LOW (ref 8.9–10.3)
Chloride: 111 mmol/L (ref 98–111)
Chloride: 112 mmol/L — ABNORMAL HIGH (ref 98–111)
Chloride: 112 mmol/L — ABNORMAL HIGH (ref 98–111)
Creatinine, Ser: 1.61 mg/dL — ABNORMAL HIGH (ref 0.61–1.24)
Creatinine, Ser: 1.57 mg/dL — ABNORMAL HIGH (ref 0.61–1.24)
Creatinine, Ser: 1.56 mg/dL — ABNORMAL HIGH (ref 0.61–1.24)
GFR, Estimated: 49 mL/min — ABNORMAL LOW (ref >60)
GFR, Estimated: 50 mL/min — ABNORMAL LOW (ref >60)
GFR, Estimated: 51 mL/min — ABNORMAL LOW (ref >60)
Glucose, Bld: 215 mg/dL — ABNORMAL HIGH (ref 70–99)
Glucose, Bld: 220 mg/dL — ABNORMAL HIGH (ref 70–99)
Glucose, Bld: 237 mg/dL — ABNORMAL HIGH (ref 70–99)
Potassium: 5 mmol/L (ref 3.5–5.1)
Potassium: 4.8 mmol/L (ref 3.5–5.1)
Potassium: 4.8 mmol/L (ref 3.5–5.1)
Sodium: 147 mmol/L — ABNORMAL HIGH (ref 135–145)
Sodium: 145 mmol/L (ref 135–145)
Sodium: 144 mmol/L (ref 135–145)

## 2020-09-13 LAB — CBC
HCT: 26.7 % — ABNORMAL LOW (ref 39.0–52.0)
HCT: 25 % — ABNORMAL LOW (ref 39.0–52.0)
Hemoglobin: 8.5 g/dL — ABNORMAL LOW (ref 13.0–17.0)
Hemoglobin: 7.9 g/dL — ABNORMAL LOW (ref 13.0–17.0)
MCH: 30.4 pg (ref 26.0–34.0)
MCH: 30.3 pg (ref 26.0–34.0)
MCHC: 31.8 g/dL (ref 30.0–36.0)
MCHC: 31.6 g/dL (ref 30.0–36.0)
MCV: 95.4 fL (ref 80.0–100.0)
MCV: 95.8 fL (ref 80.0–100.0)
Platelets: 131 10*3/uL — ABNORMAL LOW (ref 150–400)
Platelets: 139 10*3/uL — ABNORMAL LOW (ref 150–400)
RBC: 2.8 MIL/uL — ABNORMAL LOW (ref 4.22–5.81)
RBC: 2.61 MIL/uL — ABNORMAL LOW (ref 4.22–5.81)
RDW: 15.9 % — ABNORMAL HIGH (ref 11.5–15.5)
RDW: 15.9 % — ABNORMAL HIGH (ref 11.5–15.5)
WBC: 15.6 10*3/uL — ABNORMAL HIGH (ref 4.0–10.5)
WBC: 17.5 10*3/uL — ABNORMAL HIGH (ref 4.0–10.5)
nRBC: 0 % (ref 0.0–0.2)
nRBC: 0 % (ref 0.0–0.2)

## 2020-09-13 LAB — GLUCOSE, CAPILLARY
Glucose-Capillary: 160 mg/dL — ABNORMAL HIGH (ref 70–99)
Glucose-Capillary: 192 mg/dL — ABNORMAL HIGH (ref 70–99)
Glucose-Capillary: 193 mg/dL — ABNORMAL HIGH (ref 70–99)
Glucose-Capillary: 211 mg/dL — ABNORMAL HIGH (ref 70–99)
Glucose-Capillary: 212 mg/dL — ABNORMAL HIGH (ref 70–99)
Glucose-Capillary: 218 mg/dL — ABNORMAL HIGH (ref 70–99)

## 2020-09-13 LAB — POCT I-STAT 7, (LYTES, BLD GAS, ICA,H+H)
Acid-Base Excess: 2 mmol/L (ref 0.0–2.0)
Acid-Base Excess: 2 mmol/L (ref 0.0–2.0)
Acid-Base Excess: 3 mmol/L — ABNORMAL HIGH (ref 0.0–2.0)
Acid-Base Excess: 3 mmol/L — ABNORMAL HIGH (ref 0.0–2.0)
Bicarbonate: 26.6 mmol/L (ref 20.0–28.0)
Bicarbonate: 27.8 mmol/L (ref 20.0–28.0)
Bicarbonate: 27.9 mmol/L (ref 20.0–28.0)
Bicarbonate: 28.7 mmol/L — ABNORMAL HIGH (ref 20.0–28.0)
Calcium, Ion: 1.2 mmol/L (ref 1.15–1.40)
Calcium, Ion: 1.21 mmol/L (ref 1.15–1.40)
Calcium, Ion: 1.23 mmol/L (ref 1.15–1.40)
Calcium, Ion: 1.27 mmol/L (ref 1.15–1.40)
HCT: 23 % — ABNORMAL LOW (ref 39.0–52.0)
HCT: 23 % — ABNORMAL LOW (ref 39.0–52.0)
HCT: 23 % — ABNORMAL LOW (ref 39.0–52.0)
HCT: 26 % — ABNORMAL LOW (ref 39.0–52.0)
Hemoglobin: 7.8 g/dL — ABNORMAL LOW (ref 13.0–17.0)
Hemoglobin: 7.8 g/dL — ABNORMAL LOW (ref 13.0–17.0)
Hemoglobin: 7.8 g/dL — ABNORMAL LOW (ref 13.0–17.0)
Hemoglobin: 8.8 g/dL — ABNORMAL LOW (ref 13.0–17.0)
O2 Saturation: 79 %
O2 Saturation: 81 %
O2 Saturation: 81 %
O2 Saturation: 87 %
Patient temperature: 36.5
Patient temperature: 36.7
Patient temperature: 36.8
Patient temperature: 36.8
Potassium: 4.7 mmol/L (ref 3.5–5.1)
Potassium: 4.7 mmol/L (ref 3.5–5.1)
Potassium: 5.1 mmol/L (ref 3.5–5.1)
Potassium: 5.5 mmol/L — ABNORMAL HIGH (ref 3.5–5.1)
Sodium: 145 mmol/L (ref 135–145)
Sodium: 145 mmol/L (ref 135–145)
Sodium: 146 mmol/L — ABNORMAL HIGH (ref 135–145)
Sodium: 148 mmol/L — ABNORMAL HIGH (ref 135–145)
TCO2: 28 mmol/L (ref 22–32)
TCO2: 29 mmol/L (ref 22–32)
TCO2: 29 mmol/L (ref 22–32)
TCO2: 30 mmol/L (ref 22–32)
pCO2 arterial: 40.4 mmHg (ref 32.0–48.0)
pCO2 arterial: 43.5 mmHg (ref 32.0–48.0)
pCO2 arterial: 45.1 mmHg (ref 32.0–48.0)
pCO2 arterial: 55.6 mmHg — ABNORMAL HIGH (ref 32.0–48.0)
pH, Arterial: 7.32 — ABNORMAL LOW (ref 7.350–7.450)
pH, Arterial: 7.398 (ref 7.350–7.450)
pH, Arterial: 7.413 (ref 7.350–7.450)
pH, Arterial: 7.426 (ref 7.350–7.450)
pO2, Arterial: 42 mmHg — ABNORMAL LOW (ref 83.0–108.0)
pO2, Arterial: 45 mmHg — ABNORMAL LOW (ref 83.0–108.0)
pO2, Arterial: 49 mmHg — ABNORMAL LOW (ref 83.0–108.0)
pO2, Arterial: 53 mmHg — ABNORMAL LOW (ref 83.0–108.0)

## 2020-09-13 LAB — HEPATIC FUNCTION PANEL
ALT: 36 U/L (ref 0–44)
AST: 56 U/L — ABNORMAL HIGH (ref 15–41)
Albumin: 1.9 g/dL — ABNORMAL LOW (ref 3.5–5.0)
Alkaline Phosphatase: 153 U/L — ABNORMAL HIGH (ref 38–126)
Bilirubin, Direct: 1.2 mg/dL — ABNORMAL HIGH (ref 0.0–0.2)
Indirect Bilirubin: 1.4 mg/dL — ABNORMAL HIGH (ref 0.3–0.9)
Total Bilirubin: 2.6 mg/dL — ABNORMAL HIGH (ref 0.3–1.2)
Total Protein: 5.1 g/dL — ABNORMAL LOW (ref 6.5–8.1)

## 2020-09-13 LAB — APTT
aPTT: 78 seconds — ABNORMAL HIGH (ref 24–36)
aPTT: 75 seconds — ABNORMAL HIGH (ref 24–36)
aPTT: 62 seconds — ABNORMAL HIGH (ref 24–36)

## 2020-09-13 LAB — TRIGLYCERIDES: Triglycerides: 143 mg/dL (ref <150)

## 2020-09-13 LAB — ANCA TITERS
Atypical P-ANCA titer: <1:20 {titer}
C-ANCA: <1:20 {titer}
P-ANCA: <1:20 {titer}

## 2020-09-13 LAB — VANCOMYCIN, RANDOM: Vancomycin Rm: 18

## 2020-09-13 LAB — LACTIC ACID, PLASMA
Lactic Acid, Venous: 2 mmol/L (ref 0.5–1.9)
Lactic Acid, Venous: 1.6 mmol/L (ref 0.5–1.9)

## 2020-09-13 LAB — PROTIME-INR
INR: 2.5 — ABNORMAL HIGH (ref 0.8–1.2)
Prothrombin Time: 26.4 seconds — ABNORMAL HIGH (ref 11.4–15.2)

## 2020-09-13 LAB — FIBRINOGEN: Fibrinogen: 672 mg/dL — ABNORMAL HIGH (ref 210–475)

## 2020-09-13 LAB — LACTATE DEHYDROGENASE: LDH: 1295 U/L — ABNORMAL HIGH (ref 98–192)

## 2020-09-13 LAB — PREPARE RBC (CROSSMATCH)

## 2020-09-13 MED ORDER — ACD FORMULA A 0.73-2.45-2.2 GM/100ML VI SOLN
1000.0000 mL | Status: DC
  Administered 2020-09-13: 21:00:00 1000 mL
  Filled 2020-09-13: qty 1000

## 2020-09-13 MED ORDER — MIDAZOLAM HCL 2 MG/2ML IJ SOLN: 5.0000 mg | Freq: Once | INTRAMUSCULAR | Status: DC

## 2020-09-13 MED ORDER — SODIUM CHLORIDE 0.9 % IV SOLN
INTRAVENOUS | Status: AC
  Filled 2020-09-13 (×4): qty 200

## 2020-09-13 MED ORDER — CALCIUM GLUCONATE-NACL 2-0.675 GM/100ML-% IV SOLN
2.0000 g | Freq: Once | INTRAVENOUS | Status: AC
  Administered 2020-09-13: 21:00:00 2000 mg via INTRAVENOUS
  Filled 2020-09-13: qty 100

## 2020-09-13 MED ORDER — HEPARIN SODIUM (PORCINE) 1000 UNIT/ML IJ SOLN
1000.0000 [IU] | Freq: Once | INTRAMUSCULAR | Status: AC
  Administered 2020-09-13: 23:00:00 1000 [IU]

## 2020-09-13 MED ORDER — VANCOMYCIN HCL 1250 MG/250ML IV SOLN
1250.0000 mg | Freq: Once | INTRAVENOUS | Status: AC
  Administered 2020-09-13: 15:00:00 1250 mg via INTRAVENOUS
  Filled 2020-09-13: qty 250

## 2020-09-13 MED ORDER — HEPARIN SODIUM (PORCINE) 1000 UNIT/ML DIALYSIS
1000.0000 [IU] | INTRAMUSCULAR | Status: DC | PRN
  Administered 2020-09-13: 10:00:00 3000 [IU] via INTRAVENOUS_CENTRAL
  Administered 2020-09-19: 12:00:00 1000 [IU] via INTRAVENOUS_CENTRAL
  Administered 2020-09-30: 2800 [IU] via INTRAVENOUS_CENTRAL
  Filled 2020-09-13: qty 4
  Filled 2020-09-13 (×4): qty 6
  Filled 2020-09-13: qty 3
  Filled 2020-09-13: qty 6

## 2020-09-13 MED ORDER — FENTANYL CITRATE (PF) 100 MCG/2ML IJ SOLN
200.0000 ug | Freq: Once | INTRAMUSCULAR | Status: DC
Start: 2020-09-13 — End: 2020-09-16

## 2020-09-13 MED ORDER — ETOMIDATE 2 MG/ML IV SOLN
40.0000 mg | Freq: Once | INTRAVENOUS | Status: AC
  Administered 2020-09-14: 12:00:00 20 mg via INTRAVENOUS
  Filled 2020-09-13: qty 20

## 2020-09-13 MED ORDER — ACETAMINOPHEN 325 MG PO TABS: 650.0000 mg | ORAL_TABLET | ORAL | Status: DC | PRN

## 2020-09-13 MED ORDER — SODIUM CHLORIDE 0.9% IV SOLUTION: Freq: Once | INTRAVENOUS | Status: AC

## 2020-09-13 MED ORDER — DIPHENHYDRAMINE HCL 12.5 MG/5ML PO LIQD
25.0000 mg | Freq: Four times a day (QID) | ORAL | Status: DC | PRN
  Filled 2020-09-13: qty 10

## 2020-09-13 MED ORDER — VECURONIUM BROMIDE 10 MG IV SOLR
10.0000 mg | Freq: Once | INTRAVENOUS | Status: AC
  Administered 2020-09-14: 12:00:00 10 mg via INTRAVENOUS
  Filled 2020-09-13: qty 10

## 2020-09-13 NOTE — Progress Notes (Signed)
Inpatient Rehab Admissions Coordinator Note:   Per OT recommendations, pt was screened for CIR candidacy by Estill Dooms, PT, DPT.  At this time note pt still on ECMO and intubated.  I will hold off on requesting a CIR consult at this time, but will follow from a distance for medical/functional progress.  Please contact me with questions.   Estill Dooms, PT, DPT [phone] 09/13/20 10:54 AM

## 2020-09-13 NOTE — Procedures (Signed)
Central Venous Catheter Insertion Procedure Note  Jasani Dolney  [id number]  [birth year]  Date:09/13/20  Time:11:02 AM   Provider Performing:Shlonda Dolloff   Procedure: Insertion of Non-tunneled Central Venous [phone]) with US guidance ([phone])   Indication(s) Hemodialysis  Consent Risks of the procedure as well as the alternatives and risks of each were explained to the patient and/or caregiver.  Consent for the procedure was obtained and is signed in the bedside chart  Anesthesia Topical only with 1% lidocaine   Timeout Verified patient identification, verified procedure, site/side was marked, verified correct patient position, special equipment/implants available, medications/allergies/relevant history reviewed, required imaging and test results available.  Sterile Technique Maximal sterile technique including full sterile barrier drape, hand hygiene, sterile gown, sterile gloves, mask, hair covering, sterile ultrasound probe cover (if used).  Procedure Description Area of catheter insertion was cleaned with chlorhexidine and draped in sterile fashion.  With real-time ultrasound guidance a HD catheter was placed into the right femoral vein. Nonpulsatile blood flow and easy flushing noted in all ports.  The catheter was sutured in place and sterile dressing applied.  Complications/Tolerance None; patient tolerated the procedure well. Chest X-ray is ordered to verify placement for internal jugular or subclavian cannulation.   Chest x-ray is not ordered for femoral cannulation.  EBL Minimal  Specimen(s) None

## 2020-09-13 NOTE — TOC Progression Note (Addendum)
Transition of Care [facility]) - Progression Note    Patient Details  Name: Donald Rowe MRN: [id number] Date of Birth: [birth year]  [facility] CM/SW Contact  Carmina Miller, LCSWA Phone Number: 09/13/2020, 11:17 AM  Clinical Narrative:    Update: Pt's spouse returned CSWs call, states pt does not have health insurance. States she would really like pt to do CIR. Pt's wife states that she may be interested in home health options. SNF placement will be a barrier due to pt not having insurance. CSW will continue to follow for dc needs.   CSW reached out to pt's spouse to try and do SNF workup, had to leave a vm.    Expected Discharge Plan: Home/Self Care Barriers to Discharge: No Barriers Identified  Expected Discharge Plan and Services Expected Discharge Plan: Home/Self Care   Discharge Planning Services: CM Consult   Living arrangements for the past 2 months: Single Family Home                                       Social Determinants of Health (SDOH) Interventions    Readmission Risk Interventions No flowsheet data found.

## 2020-09-13 NOTE — Progress Notes (Signed)
Physical Therapy Treatment Patient Details Name: Donald Rowe MRN: [id number] DOB: 08-09-1961 Today's Date: 09/13/2020    History of Present Illness 59 y.o. male with a pertinent history of hypothyroidism on levothyroxine and hyperlipidemia who was diagnosed with Covid 11/27 and is not vaccinated.  With EMS SaO2 on RA 78%O2. ED patient was increased to 6 L oxygen satting about 92%,. CTA of the Chest was assessed and negative for pulmonary embolism but showed diffuse bilateral infiltrates. He was treated with IV remdesivir, steroids and baricitinib.  Initially, he was also treated with antibiotics but these were stopped on 12/6 with a low procalcitonin. 12/07 intubated, central line, A line, 12/08 ECMO cannulation, cortrak, attempted extubation 12/9 failed reintubated.    PT Comments    Pt under high level of sedation. RN reports under lighter sedation this morning pt was agitated with need for increased ventilator support. Use of Kreg bed to tilt to semi standing, with initial attempt to 40 degrees pt feet noted to have slipped forward placing knees in hyperextension, laid back and readjusted feet, holding in place during tilt, able to maintain 40 degrees for 20 minutes, 1x attempt at 45 degrees for ~ 3 min increased L knee buckle so returned to 40 degrees. While pt is so heavily sedated PT(2x/wk) and OT(2x/wk), Therapy will plan to see at least 4x/wk to assess PROM and tone, and body positioning with tilting. Once pt is more responsive Therapy will increase visits and mobility.      Follow Up Recommendations  CIR     Equipment Recommendations  Other (comment) (TBD)       Precautions / Restrictions Precautions Precaution Comments: COVID+, ECMO, cortrak, intubated, flexiseal, condom cath Restrictions Weight Bearing Restrictions: No    Mobility  Bed Mobility               General bed mobility comments: Total A +2 for reposition in bed. Use of krege tilt bed for semi standing. +2 to  manage line and safety  Transfers                 General transfer comment: Utilized tilt bed to come to semi-standing position    09/13/20 1114  Tilt Bed  Start Time 1131  Angle 40 degrees (45 degrees for 3 min)  Total Minutes in Angle 20 minutes  BP (!) 159/53  Pulse Rate 95  ECG Heart Rate 96  Resp 14  SpO2 99 %  O2 Device Ventilator  O2 Flow Rate (L/min) 15 L/min  FiO2 (%) 60 % (TV 232) PEEP 10  Patient Response  (No response to nail bed pressure)            Cognition Arousal/Alertness: Suspect due to medications (sedated) Behavior During Therapy: Flat affect Overall Cognitive Status: Difficult to assess                                           General Comments General comments (skin integrity, edema, etc.): RN and ECMO specialist present throughout session, SaO2 96-99%O2, HR in high 90s BP stable via ART line          Pertinent Vitals/Pain Pain Assessment: Faces Faces Pain Scale: No hurt Pain Location: no change in response to nail bed pressure or in elevated position           PT Goals (current goals can now be found in the care  plan section) Acute Rehab PT Goals Patient Stated Goal: Unstated PT Goal Formulation: Patient unable to participate in goal setting Time For Goal Achievement: 09/22/20 Potential to Achieve Goals: Poor Progress towards PT goals: Progressing toward goals    Frequency    Min 3X/week      PT Plan Current plan remains appropriate       AM-PAC PT "6 Clicks" Mobility   Outcome Measure  Help needed turning from your back to your side while in a flat bed without using bedrails?: Total Help needed moving from lying on your back to sitting on the side of a flat bed without using bedrails?: Total Help needed moving to and from a bed to a chair (including a wheelchair)?: Total Help needed standing up from a chair using your arms (e.g., wheelchair or bedside chair)?: Total Help needed to walk in  hospital room?: Total Help needed climbing 3-5 steps with a railing? : Total 6 Click Score: 6    End of Session Equipment Utilized During Treatment: Other (comment) (Kreg bed) Activity Tolerance: Patient tolerated treatment well Patient left: in bed;with nursing/sitter in room Nurse Communication: Other (comment) (tilt schedule goal 3x/day) PT Visit Diagnosis: Muscle weakness (generalized) (M62.81);Difficulty in walking, not elsewhere classified (R26.2)     Time: 2585-2778 PT Time Calculation (min) (ACUTE ONLY): 45 min  Charges:  $Therapeutic Activity: 38-52 mins                     Dandrae Kustra B. Beverely Risen PT, DPT Acute Rehabilitation Services Pager [phone] Office [phone]    Elon Alas Fleet 09/13/2020, 12:23 PM

## 2020-09-13 NOTE — Progress Notes (Signed)
PLEX treatment completed at bedside by HD RN. ECMO flows decreased on initiation of treatment and yellow cap removed to decreased risk of air entrainment. Venous bubble detected x3 and alarm cleared by this ES. No air crossed over to return side of circuit. No air visualized on return side. Sats dropped to low 80's with decrease in flows but returned to >90% with increase in flows.

## 2020-09-13 NOTE — Progress Notes (Signed)
Patient ID: Donald Rowe, male   DOB: [birth year], 59 y.o.   MRN: [id number]    Advanced Heart Failure Rounding Note   Subjective:    12/8 Cannulated for VV ECMO - 32 FR  RIJ Crescent 12/9 Extubated/Reintubated for altered mental status 12/13 Circuit change for elevated LDH  Staph aureus growing in sputum.   Remains sedated on ECMO. Sats dropped this am. Flow turned up. Sweep up to 9. LDH stable at 1295. BP labile. Urine a bit darker. Creatinine up.   ECMO   Speed 4000 Flow 4.87L  Sweep 9 dP 24 pVen  -100  ABG: 7.42/40/42/79% Hgb 8.5 LDH 369 -> 447 -> 743 -> 1081 -> 1569 -> 1202 -> 1294 -> 1295 Lactic acid 2.0  PTT 78  Vent 40% TVs 400s CXR: slightly improved diffuse bilateral infiltrates. Cannula position looks good today Personally reviewed     Objective:   Weight Range:  Vital Signs:   Temp:  [97.7 F (36.5 C)-98.6 F (37 C)] 97.7 F (36.5 C) (12/15 1200) Pulse Rate:  [90-111] 95 (12/15 1400) Resp:  [0-36] 14 (12/15 1400) BP: (159-168)/(53-61) 159/53 (12/15 1200) SpO2:  [91 %-99 %] 97 % (12/15 1400) Arterial Line BP: (127-183)/(52-69) 163/56 (12/15 1400) FiO2 (%):  [40 %-60 %] 60 % (12/15 1200) Weight:  [116.5 kg] 116.5 kg (12/15 0300) Last BM Date: 09/12/20  Weight change: Filed Weights   09/11/20 0600 09/12/20 0407 09/13/20 0300  Weight: (S) 107.8 kg 109.9 kg 116.5 kg    Intake/Output:   Intake/Output Summary (Last 24 hours) at 09/13/2020 1456 Last data filed at 09/13/2020 1400 Gross per 24 hour  Intake 5357.08 ml  Output 3135 ml  Net 2222.08 ml     Physical Exam: General:  Intubated/sedated No resp difficulty HEENT: normal + ETT Neck: supple. RIJ ECMO cannulaCarotids 2+ bilat; no bruits. No lymphadenopathy or thryomegaly appreciated. Cor: PMI nondisplaced. Regular rate & rhythm. No rubs, gallops or murmurs. Lungs: coarse Abdomen: obese soft, nontender, nondistended. No hepatosplenomegaly. No bruits or masses. Good bowel  sounds. Extremities: no cyanosis, clubbing, rash, trace  Edema R hand mildly dusky Neuro: intubated/sedated  Telemetry: sinus 90-100 Personally reviewed    Labs: Basic Metabolic Panel: Recent Labs  Lab 09/07/20 0438 09/07/20 0620 09/08/20 0343 09/08/20 0351 09/09/20 0458 09/09/20 0501 09/11/20 1615 09/11/20 1617 09/12/20 0352 09/12/20 0403 09/12/20 1551 09/12/20 1637 09/12/20 1825 09/12/20 2038 09/12/20 2231 09/13/20 0022 09/13/20 0304 09/13/20 0325 09/13/20 1206  NA 144   145   < > 146*   < > 148*   < > 146*   < > 147*   < > 145   < > 145   < > 145 145 147* 146* 148*  K 4.3   4.3   < > 4.4   < > 4.4   < > 3.9   < > 4.7   < > 5.9*   < > 5.9*   < > 5.5* 5.1 5.0 4.7 4.7  CL 112*   < > 116*   < > 118*   < > 111  --  111  --  111  --  111  --   --   --  111  --   --   CO2 26   < > 23   < > 25   < > 25  --  28  --  27  --  26  --   --   --  26  --   --  GLUCOSE 214*   < > 231*   < > 231*   < > 142*  --  133*  --  172*  --  193*  --   --   --  215*  --   --   BUN 40*   < > 40*   < > 40*   < > 51*  --  55*  --  61*  --  67*  --   --   --  75*  --   --   CREATININE 1.22   < > 1.04   < > 0.94   < > 1.16  --  1.27*  --  1.36*  --  1.47*  --   --   --  1.61*  --   --   CALCIUM 7.9*   < > 7.5*   < > 7.9*   < > 7.7*  --  8.0*  --  8.0*  --  8.1*  --   --   --  8.4*  --   --   MG 2.5*  --  2.8*  --  2.7*  --   --   --   --   --   --   --   --   --   --   --   --   --   --   PHOS 2.0*  --  1.8*  --  2.1*  --  3.4  --   --   --   --   --   --   --   --   --   --   --   --    < > = values in this interval not displayed.    Liver Function Tests: Recent Labs  Lab 09/09/20 0458 09/10/20 0344 09/11/20 0503 09/12/20 0352 09/13/20 0304  AST 73* 70* 69* 52* 56*  ALT 60* 61* 44 36 36  ALKPHOS 79 102 128* 137* 153*  BILITOT 1.9* 2.0* 3.7* 2.2* 2.6*  PROT 4.5* 4.3* 4.2* 4.7* 5.1*  ALBUMIN 2.3* 2.0* 1.7* 1.9* 1.9*   No results for input(s): LIPASE, AMYLASE in the last 168 hours. No  results for input(s): AMMONIA in the last 168 hours.  CBC: Recent Labs  Lab 09/07/20 0438 09/07/20 0620 09/11/20 0503 09/11/20 0509 09/11/20 1615 09/11/20 1617 09/12/20 0352 09/12/20 0403 09/12/20 1551 09/12/20 1637 09/12/20 2231 09/13/20 0022 09/13/20 0304 09/13/20 0325 09/13/20 1206  WBC 9.0   < > 15.1*  --  12.7*  --  17.3*  --  19.5*  --   --   --  15.6*  --   --   NEUTROABS 7.7  --   --   --   --   --   --   --   --   --   --   --   --   --   --   HGB 10.4*   9.5*   < > 8.0*   < > 9.2*   < > 9.5*   < > 9.2*   < > 7.8* 7.8* 8.5* 7.8* 8.8*  HCT 31.3*   28.0*   < > 24.0*   < > 28.3*   < > 28.8*   < > 29.3*   < > 23.0* 23.0* 26.7* 23.0* 26.0*  MCV 94.8   < > 94.9  --  94.0  --  94.1  --  96.1  --   --   --  95.4  --   --   PLT 141*   < > 129*  --  113*  --  130*  --  127*  --   --   --  131*  --   --    < > = values in this interval not displayed.    Cardiac Enzymes: No results for input(s): CKTOTAL, CKMB, CKMBINDEX, TROPONINI in the last 168 hours.  BNP: BNP (last 3 results) No results for input(s): BNP in the last 8760 hours.  ProBNP (last 3 results) No results for input(s): PROBNP in the last 8760 hours.    Other results:  Imaging: DG CHEST PORT 1 VIEW  Result Date: 09/13/2020 CLINICAL DATA:  COVID-19 positive. EXAM: PORTABLE CHEST 1 VIEW COMPARISON:  September 12, 2020. FINDINGS: Stable position of endotracheal and feeding tubes. Left internal jugular catheter is unchanged. ECMO is unchanged. Stable bilateral diffuse lung opacities are noted consistent with pneumonia. No pneumothorax is noted. Bony thorax is unremarkable. IMPRESSION: Stable support apparatus. Stable bilateral diffuse lung opacities are noted consistent with pneumonia. Electronically Signed   By: Lupita Raider M.D.   On: 09/13/2020 08:11   DG CHEST PORT 1 VIEW  Result Date: 09/12/2020 CLINICAL DATA:  COVID.  ECMO. EXAM: PORTABLE CHEST 1 VIEW COMPARISON:  09/11/2020. FINDINGS: Endotracheal  tube, feeding tube, left IJ line, ECMO device in stable position. Progressive dense bilateral pulmonary infiltrates with near complete opacification of both lungs. Bilateral pleural effusions cannot be excluded. No pneumothorax. Heart size cannot be assessed. No acute bony abnormality. IMPRESSION: 1. Lines and tube including ECMO device s in stable position. 2. Progressive dense bilateral pulmonary infiltrates with near complete opacification of both lungs. Electronically Signed   By: Maisie Fus  Register   On: 09/12/2020 07:01     Medications:     Scheduled Medications:  aspirin  81 mg Per Tube Daily   chlorhexidine gluconate (MEDLINE KIT)  15 mL Mouth Rinse BID   Chlorhexidine Gluconate Cloth  6 each Topical Daily   clonazePAM  1 mg Per Tube BID   docusate  100 mg Per Tube BID   etomidate  40 mg Intravenous Once   feeding supplement (PIVOT 1.5 CAL)  1,000 mL Per Tube Q24H   feeding supplement (PROSource TF)  45 mL Per Tube QID   fentaNYL (SUBLIMAZE) injection  200 mcg Intravenous Once   fiber  1 packet Per Tube BID   free water  200 mL Per Tube Q2H   insulin aspart  2-6 Units Subcutaneous Q4H   insulin aspart  8 Units Subcutaneous Q4H   insulin detemir  24 Units Subcutaneous Q12H   levothyroxine  50 mcg Per Tube Q0600   mouth rinse  15 mL Mouth Rinse 10 times per day   methylPREDNISolone (SOLU-MEDROL) injection  60 mg Intravenous Q6H   midazolam  5 mg Intravenous Once   oxyCODONE  5 mg Per Tube Q6H   pantoprazole sodium  40 mg Per Tube Daily   pravastatin  40 mg Per Tube q1800   QUEtiapine  100 mg Per Tube BID   sodium chloride flush  10-40 mL Intracatheter Q12H   valproic acid  500 mg Per Tube BID   vecuronium  10 mg Intravenous Once    Infusions:  sodium chloride 10 mL/hr at 09/13/20 1400   albumin human Stopped (09/11/20 1900)   bivalirudin (ANGIOMAX) infusion 0.5 mg/mL (Non-ACS indications) 0.18 mg/kg/hr (09/13/20 1400)   dexmedetomidine  (PRECEDEX) IV infusion 0.8 mcg/kg/hr (09/13/20 1400)  dextrose     fentaNYL infusion INTRAVENOUS 150 mcg/hr (09/13/20 1400)   midazolam 2 mg/hr (09/13/20 1400)   norepinephrine (LEVOPHED) Adult infusion Stopped (09/12/20 1530)   propofol (DIPRIVAN) infusion 20 mcg/kg/min (09/13/20 1400)   vancomycin      PRN Medications: sodium chloride, acetaminophen (TYLENOL) oral liquid 160 mg/5 mL, albumin human, dextrose, fentaNYL, heparin, labetalol, LORazepam, ondansetron **OR** ondansetron (ZOFRAN) IV, polyethylene glycol, sodium chloride flush   Assessment/Plan:   1. Acute hypoxic respiratory failure/ARDS in setting of COVID-19 PNA - Cannulated for VV ECMO on 12/8 after failing intubation x 2 days - has complete remedisivir, baricitinib, Solumedrol.  - continue abx (cefepime) - stop dates in place  - staph in tracheal aspirate -  vanc added 12/14 - circuit changed 12/13 for elevated LDH and lower flows. LDH still almost 1300. Start PLEX today. - CXR with severe diffuse infiltrates slightly improved today. Cannula position ok.  - PTT 75 Discussed  with PharmD personally. - Driving pressure ok  2. Bilateral DVT - remains on bival  3. DM2 - on insulin gtt. adjusted  4. Obesity -Body mass index is 33.73 kg/m.  5. F/E/N - Na 147 -> 153 - Continue free water  6. HTN - Continue metoprolol 50 mg bid.   7. Dusky R thumb - likely embolic. Stable follow  8. AKI - creatinine up today. Suspect due to hemolysis. Hold diuretics  Plan discussed on multi-discplinary ECMO rounds with CCM, ECMO coordinator/specialist, PharmDs and RNs.  CRITICAL CARE Performed by: Glori Bickers  Total critical care time: 35 minutes  Critical care time was exclusive of separately billable procedures and treating other patients.  Critical care was necessary to treat or prevent imminent or life-threatening deterioration.  Critical care was time spent personally by me (independent of midlevel  providers or residents) on the following activities: development of treatment plan with patient and/or surrogate as well as nursing, discussions with consultants, evaluation of patient's response to treatment, examination of patient, obtaining history from patient or surrogate, ordering and performing treatments and interventions, ordering and review of laboratory studies, ordering and review of radiographic studies, pulse oximetry and re-evaluation of patient's condition.    Length of Stay: 88   Glori Bickers MD 09/13/2020, 2:56 PM  Advanced Heart Failure Team Pager [phone] (M-F; [place])  Please contact [facility] for night-coverage after hours (4p -7a ) and weekends on [email or web address]

## 2020-09-13 NOTE — Progress Notes (Deleted)
Tube feeding held per order for tracheostomy procedure.

## 2020-09-13 NOTE — Progress Notes (Signed)
Palliative:  Donald Rowe goes by "[name]" not Junior and not Mekhi (he hates these names per wife).  HPI:59 y.o.malewith a pertinent history ofhypothyroidism on levothyroxine and hyperlipidemia who was diagnosed with Covid 1 week ago and is not vaccinated. With EMS he was seen to bedesaturatingto 78% on room air. He states he started having symptoms on Friday and tested positive on Saturday.Had initially elected to be DNAR without intubation though later changed this and got intubated on 12/6 in the setting of worsening respiratory failure with hypoxia. CTA chest w/diffuse bilateral infiltrates.On 12/8 was cannulated for VV ECMO.Had been extubated on 12/9 then emergently reintubated in early eveningdue to clinical instability. Palliative care was asked to get involved in the setting of VV ECMO to set goals and expectations withJ.R.'sfamily.He continues on VV ECMO and ventilator support. ECMO circuit change 09/11/20.  Discussed plan with Dr. Katrinka Blazing. Plans for tracheostomy likely tomorrow. Concern that he may need dialysis in the future. Plans for PLEX today. Dr. Katrinka Blazing will call and discuss plan with wife today. I will reach out to daughter, Donald Rowe.   I placed call to daughter, Donald Rowe. Unable to reach. Left voicemail with my contact information. Did not hear back from daughter today.   No charge  Yong Channel, NP Palliative Medicine Team Pager [phone] (Please see [email or web address] for schedule) Team Phone [phone]

## 2020-09-13 NOTE — Progress Notes (Signed)
ANTICOAGULATION CONSULT NOTE  Pharmacy Consult for bivalirudin Indication: ECMO + bilateral DVTs   Recent Labs    09/11/20 0503 09/11/20 0509 09/12/20 0352 09/12/20 0403 09/12/20 1551 09/12/20 1637 09/12/20 1825 09/12/20 2038 09/13/20 0304 09/13/20 0325 09/13/20 1157 09/13/20 1206  HGB 8.0*   < > 9.5*   < > 9.2*   < >  --    < > 8.5* 7.8*  --  8.8*  HCT 24.0*   < > 28.8*   < > 29.3*   < >  --    < > 26.7* 23.0*  --  26.0*  PLT 129*   < > 130*  --  127*  --   --   --  131*  --   --   --   APTT 81*   < > 69*  --  70*  --   --   --  78*  --  75*  --   LABPROT 35.5*  --  28.7*  --   --   --   --   --  26.4*  --   --   --   INR 3.7*  --  2.8*  --   --   --   --   --  2.5*  --   --   --   CREATININE 0.99   < > 1.27*  --  1.36*  --  1.47*  --  1.61*  --   --   --    < > = values in this interval not displayed.    Estimated Creatinine Clearance: 62.2 mL/min (A) (by C-G formula based on SCr of 1.61 mg/dL (H)).   Assessment: 53 yom unvaccinated presenting with severe COVID ARDs, intubated on 12/6, continued to have ventilation issues despite NMB and proning - started on VV ECMO on 12/8.  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   aPTT this morning 78 sec and bivalrudin rate reduced to 0.2 mg/kg/hr; repeat aptt 75.  Goal of Therapy:  aPTT 60-70 seconds Monitor platelets by anticoagulation protocol: Yes   Plan:  Decrease bivalirudin to 0.18 mg/kg/h (dosing wt 103kg) Check aPTT q12h Monitor CBC, s/sx bleeding  Thanks for allowing pharmacy to be a part of this patient's care.  Reece Leader, Colon Flattery, BCCP Clinical Pharmacist  09/13/2020 1:04 PM   [facility] pharmacy phone numbers are listed on [email or web address]

## 2020-09-13 NOTE — Progress Notes (Signed)
Right femoral Trialysis catheter placed by Dr Garnet Sierras.  ECMO RPMs decreased for flows around 3.2 LPM and yellow cap removed to decrease risk of air entrainment.  Venous bubble alarm went off x2.  Alarm easily cleared and no air seen in lines.  At end of procedure RMPs increased back to 4000 for flows around 4.4; O2 sats remained stable throughout 90% and returned to 95%.  Aline August ECMO Specialist

## 2020-09-13 NOTE — Plan of Care (Signed)
  Problem: Fluid Volume: Goal: Hemodynamic stability will improve Outcome: Progressing   Problem: Clinical Measurements: Goal: Diagnostic test results will improve Outcome: Progressing Goal: Signs and symptoms of infection will decrease Outcome: Progressing   Problem: Clinical Measurements: Goal: Signs and symptoms of infection will decrease Outcome: Progressing   Problem: Nutrition: Goal: Adequate nutrition will be maintained Outcome: Progressing   Problem: Elimination: Goal: Will not experience complications related to bowel motility Outcome: Progressing Goal: Will not experience complications related to urinary retention Outcome: Progressing

## 2020-09-13 NOTE — Progress Notes (Signed)
ANTICOAGULATION CONSULT NOTE  Pharmacy Consult for bivalirudin Indication: ECMO + bilateral DVTs   Recent Labs    09/11/20 0503 09/11/20 0509 09/12/20 0352 09/12/20 0403 09/12/20 1551 09/12/20 1637 09/12/20 1825 09/12/20 2038 09/13/20 0022 09/13/20 0304 09/13/20 0325  HGB 8.0*   < > 9.5*   < > 9.2*   < >  --    < > 7.8* 8.5* 7.8*  HCT 24.0*   < > 28.8*   < > 29.3*   < >  --    < > 23.0* 26.7* 23.0*  PLT 129*   < > 130*  --  127*  --   --   --   --  131*  --   APTT 81*   < > 69*  --  70*  --   --   --   --  78*  --   LABPROT 35.5*  --  28.7*  --   --   --   --   --   --  26.4*  --   INR 3.7*  --  2.8*  --   --   --   --   --   --  2.5*  --   CREATININE 0.99   < > 1.27*  --  1.36*  --  1.47*  --   --  1.61*  --    < > = values in this interval not displayed.    Estimated Creatinine Clearance: 62.2 mL/min (A) (by C-G formula based on SCr of 1.61 mg/dL (H)).   Assessment: 36 yom unvaccinated presenting with severe COVID ARDs, intubated on 12/6, continued to have ventilation issues despite NMB and proning - started on VV ECMO on 12/8.  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   aPTT this morning 78 sec.  Hg down to 7.8 Goal of Therapy:  aPTT 60-70 seconds Monitor platelets by anticoagulation protocol: Yes   Plan:  Decrease bivalirudin to 0.2 mg/kg/h (dosing wt 103kg) Check aPTT q12h Monitor CBC, s/sx bleeding  Thanks for allowing pharmacy to be a part of this patient's care.  Talbert Cage, PharmD Clinical Pharmacist 09/13/2020 4:20 AM

## 2020-09-13 NOTE — Consult Note (Signed)
Reason for Consult: Covid associated hemolytic anemia/coagulopathy, acute kidney injury Referring Physician: Erskine Emery, MD (CCM)  HPI:  59 year old Caucasian man admitted with a 1 week history of shortness of breath and feeling poorly with myalgias and weakness.  He is unvaccinated and tested positive for COVID-19 infection on 11/27-prior to presentation; upon admission was found to have significant hypoxic respiratory failure after initially declining intubation, consented to intubation for worsening ventilation on 09/04/2020.  He has continued to have problems with ventilation with prone positioning and was consequently started on VV ECMO.  He has bilateral lower extremity DVT and initial CT angiogram of the chest did not show any PE.  He has been treated with intravenous remdesivir, steroids and barcitnib.  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  His creatinine up until 12/13 was 0.9-1.2 and has since gradually risen to 1.6.  Urine output decent at 2.7 L overnight.  History reviewed. No pertinent past medical history.  Past Surgical History:  Procedure Laterality Date  . ECMO CANNULATION N/A 09/22/2020   Procedure: ECMO CANNULATION;  Surgeon: Jolaine Artist, MD;  Location: [place] [facility];  Service: Cardiovascular;  Laterality: N/A;    History reviewed. No pertinent family history.  Social History:  reports that he has never smoked. He has never used smokeless tobacco. No history on file for alcohol use and drug use.  Allergies: No Known Allergies  Medications:  Scheduled: . aspirin  81 mg Per Tube Daily  . chlorhexidine gluconate (MEDLINE KIT)  15 mL Mouth Rinse BID  . Chlorhexidine Gluconate Cloth  6 each Topical Daily  . clonazePAM  1 mg Per Tube BID  . docusate  100 mg Per Tube BID  . etomidate  40 mg Intravenous Once  . feeding  supplement (PIVOT 1.5 CAL)  1,000 mL Per Tube Q24H  . feeding supplement (PROSource TF)  45 mL Per Tube QID  . fentaNYL (SUBLIMAZE) injection  200 mcg Intravenous Once  . fiber  1 packet Per Tube BID  . free water  200 mL Per Tube Q2H  . insulin aspart  2-6 Units Subcutaneous Q4H  . insulin aspart  8 Units Subcutaneous Q4H  . insulin detemir  24 Units Subcutaneous Q12H  . levothyroxine  50 mcg Per Tube Q0600  . mouth rinse  15 mL Mouth Rinse 10 times per day  . methylPREDNISolone (SOLU-MEDROL) injection  60 mg Intravenous Q6H  . midazolam  5 mg Intravenous Once  . oxyCODONE  5 mg Per Tube Q6H  . pantoprazole sodium  40 mg Per Tube Daily  . pravastatin  40 mg Per Tube q1800  . QUEtiapine  100 mg Per Tube BID  . sodium chloride flush  10-40 mL Intracatheter Q12H  . valproic acid  500 mg Per Tube BID  . vecuronium  10 mg Intravenous Once    BMP Latest Ref Rng & Units 09/13/2020 09/13/2020 09/13/2020  Glucose 70 - 99 mg/dL - - 215(H)  BUN 6 - 20 mg/dL - - 75(H)  Creatinine 0.61 - 1.24 mg/dL - - 1.61(H)  Sodium 135 - 145 mmol/L 148(H) 146(H) 147(H)  Potassium 3.5 - 5.1 mmol/L 4.7 4.7 5.0  Chloride 98 - 111 mmol/L - - 111  CO2 22 - 32 mmol/L - - 26  Calcium 8.9 - 10.3 mg/dL - - 8.4(L)   CBC Latest Ref Rng & Units 09/13/2020 09/13/2020 09/13/2020  WBC 4.0 - 10.5 K/uL - - 15.6(H)  Hemoglobin 13.0 - 17.0 g/dL 8.8(L) 7.8(L) 8.5(L)  Hematocrit 39.0 - 52.0 % 26.0(L) 23.0(L) 26.7(L)  Platelets 150 - 400 K/uL - - 131(L)     DG CHEST PORT 1 VIEW  Result Date: 09/13/2020 CLINICAL DATA:  COVID-19 positive. EXAM: PORTABLE CHEST 1 VIEW COMPARISON:  September 12, 2020. FINDINGS: Stable position of endotracheal and feeding tubes. Left internal jugular catheter is unchanged. ECMO is unchanged. Stable bilateral diffuse lung opacities are noted consistent with pneumonia. No pneumothorax is noted. Bony thorax is unremarkable. IMPRESSION: Stable support apparatus. Stable bilateral diffuse lung  opacities are noted consistent with pneumonia. Electronically Signed   By: Marijo Conception M.D.   On: 09/13/2020 08:11   DG CHEST PORT 1 VIEW  Result Date: 09/12/2020 CLINICAL DATA:  COVID.  ECMO. EXAM: PORTABLE CHEST 1 VIEW COMPARISON:  09/11/2020. FINDINGS: Endotracheal tube, feeding tube, left IJ line, ECMO device in stable position. Progressive dense bilateral pulmonary infiltrates with near complete opacification of both lungs. Bilateral pleural effusions cannot be excluded. No pneumothorax. Heart size cannot be assessed. No acute bony abnormality. IMPRESSION: 1. Lines and tube including ECMO device s in stable position. 2. Progressive dense bilateral pulmonary infiltrates with near complete opacification of both lungs. Electronically Signed   By: Marcello Moores  Register   On: 09/12/2020 07:01    Review of Systems  Unable to perform ROS: Intubated   Blood pressure (!) 159/53, pulse 95, temperature 98.06 F (36.7 C), resp. rate 14, height $RemoveBe'5\' 9"'PbqvGVjTC$  (1.753 m), weight 116.5 kg, SpO2 99 %. Physical Exam Vitals reviewed.  Constitutional:      Appearance: He is well-developed. He is obese. He is ill-appearing.     Comments: Intubated/sedated  HENT:     Head: Normocephalic and atraumatic.     Comments: Intubated Eyes:     Pupils: Pupils are equal, round, and reactive to light.  Neck:     Comments: Right IJ ECMO line, left IJ central venous line and right femoral temporary hemodialysis catheter. Cardiovascular:     Rate and Rhythm: Normal rate and regular rhythm.     Heart sounds: No murmur heard. No gallop.   Pulmonary:     Breath sounds: Examination of the left-upper field reveals decreased breath sounds. Examination of the right-middle field reveals decreased breath sounds. Examination of the left-middle field reveals decreased breath sounds. Examination of the right-lower field reveals decreased breath sounds. Examination of the left-lower field reveals decreased breath sounds. Decreased breath  sounds present.     Comments: Coarse breath sounds bilaterally with decreased sounds anteriorly except for right upper chest Chest:     Chest wall: No edema.  Abdominal:     General: Bowel sounds are normal.     Palpations: Abdomen is soft. There is no mass.  Musculoskeletal:     Right lower leg: Edema present.     Left lower leg: Edema present.     Comments: 2+ anasarca  Skin:    General: Skin is warm.     Coloration: Skin is pale.     Assessment/Plan: 1.  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  The plan is to undertake plasmapheresis at this time to try and improve on markers of hemolysis. 2.  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 3.  Acute hypoxic respiratory failure:  Secondary to COVID-19 infection, on VV ECMO and attempting strategies to optimize ventilation and oxygenation. 4.  Bilateral lower extremity DVTs: Secondary to prothrombotic state in COVID-19 infection.  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 5.  Healthcare associated pneumonia/MRSA tracheitis: Continue on intravenous vancomycin.  Dona Klemann K. 09/13/2020, 12:28 PM

## 2020-09-13 NOTE — Procedures (Addendum)
Extracorporeal support note   ECLS cannulation: 09/24/2020 Last Circuit Change: 09/11/20 Indication: COVD ARDS  Configuration: VV, RIJ 32Fr [place]  Pump speed: 4025 Pump flow: 4.91 Pump used: Cardiohelp  Sweep gas: 100%,9LPM (up)  Circuit check: no clots Anticoagulant: bivalirudin Anticoagulation targets: PTT 50-70    Changes in support: initiate PLEX today, consider trach tomorrow  Anticipated goals/duration of support: bridge to recovery  Myrla Halsted MD PCCM  09/13/2020, 7:18 AM

## 2020-09-13 NOTE — Progress Notes (Signed)
ANTICOAGULATION CONSULT NOTE  Pharmacy Consult for bivalirudin Indication: ECMO + bilateral DVTs   Recent Labs    09/11/20 0503 09/11/20 0509 09/12/20 0352 09/12/20 0403 09/12/20 1551 09/12/20 1637 09/12/20 1825 09/12/20 2038 09/13/20 0304 09/13/20 0325 09/13/20 1157 09/13/20 1206 09/13/20 1700  HGB 8.0*   < > 9.5*   < > 9.2*   < >  --    < > 8.5* 7.8*  --  8.8*  --   HCT 24.0*   < > 28.8*   < > 29.3*   < >  --    < > 26.7* 23.0*  --  26.0*  --   PLT 129*   < > 130*  --  127*  --   --   --  131*  --   --   --   --   APTT 81*   < > 69*  --  70*  --   --   --  78*  --  75*  --  62*  LABPROT 35.5*  --  28.7*  --   --   --   --   --  26.4*  --   --   --   --   INR 3.7*  --  2.8*  --   --   --   --   --  2.5*  --   --   --   --   CREATININE 0.99   < > 1.27*  --  1.36*  --  1.47*  --  1.61*  --   --   --   --    < > = values in this interval not displayed.    Estimated Creatinine Clearance: 62.2 mL/min (A) (by C-G formula based on SCr of 1.61 mg/dL (H)).   Assessment: 14 yom unvaccinated presenting with severe COVID ARDs, intubated on 12/6, continued to have ventilation issues despite NMB and proning - started on VV ECMO on 12/8.  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   aPTT this evening therapeutic at 62 seconds after bivalirudin rate reduced this AM. CBC stable. No active bleed issues reported..  Goal of Therapy:  aPTT 60-70 seconds Monitor platelets by anticoagulation protocol: Yes   Plan:  Continue bivalirudin at 0.18 mg/kg/h (dosing wt 103kg) Check aPTT q12h Monitor CBC, s/sx bleeding   Leia Alf, PharmD, BCPS Please check AMION for all [facility] contact numbers Clinical Pharmacist 09/13/2020 6:01 PM

## 2020-09-13 NOTE — Progress Notes (Signed)
NAME:  Donald Rowe, MRN:  [id number], DOB:  04-24-1961, LOS: 11 ADMISSION DATE:  09/01/2020, CONSULTATION DATE:  12/6 REFERRING MD:  Dr. David Stall, CHIEF COMPLAINT:  SOB    Brief History   59 y/o M admitted 12/4 with 1 week hx of SOB, known COVID positive.  He is unvaccinated.  CTA chest negative for PE but demonstrated diffuse bilateral infiltrates.   History of present illness   59 y/o unvaccinated male, admitted 12/4 with a one week history of shortness of breath.    The patient began feeling poorly the day before Thanksgiving with fever, weakness and body aches. He tested positive for COVID on 12/27 prior to presentation.  He developed progressive shortness of breath.  In the ER, the patient was found to have saturations of 78%.  He was started on 6L O2 with initial improvement in saturations.  The patient was admitted per [facility]. CTA of the Chest was assessed and negative for pulmonary embolism but showed diffuse bilateral infiltrates. He was treated with IV remdesivir, steroids and baricitinib.  Initially, he was also treated with antibiotics but these were stopped on 12/6 with a low procalcitonin.  LE doppler negative for DVT. The patient elected to be DNR / no intubation.  He developed worsening respiratory failure with hypoxia requiring 60L flow 100% and PCCM consulted 12/6 for evaluation.   Past Medical History  Hypothyroidism  HLD   Significant Hospital Events/Procedures  12/04 Admit  12/06 PCCM consulted  12/07 Intubated, central line, a line 12/08 VV ECMO Cannulaton, cortrak 12/13 circuit change for hemolytic anemia  Consults:  PCCM, Heart failure, TCTS  Significant Diagnostic Tests:   CTA Chest 12/4 >> extensive bilateral airspace disease, no large PE identified, limited study   LE Venous Duplex 12/4 >> negative for DVT bilaterally   Micro Data:  COVID 12/4 >> negative  Influenza A/B 12/4 >> negative  MRSA PCR 12/5 >> negative  BCx2 12/4 >>   Antimicrobials:   Azithromycin 12/5 >> 12/6  Ceftriaxone 12/5 >> 12/6   Interim history/subjective:  TV dropped when heavily sedated, now back to 400s with lightening of sedation.  Objective   Blood pressure (!) 124/44, pulse 99, temperature 98.06 F (36.7 C), resp. rate 16, height 5\' 9"  (1.753 m), weight 116.5 kg, SpO2 95 %.    Vent Mode: PCV FiO2 (%):  [40 %-60 %] 60 % Set Rate:  [12 bmp] 12 bmp Vt Set:  [106 mL] 106 mL PEEP:  [10 cmH20] 10 cmH20 Plateau Pressure:  [19 cmH20-26 cmH20] 26 cmH20   Intake/Output Summary (Last 24 hours) at 09/13/2020 1133 Last data filed at 09/13/2020 1100 Gross per 24 hour  Intake 5279.3 ml  Output 2360 ml  Net 2919.3 ml   Filed Weights   09/11/20 0600 09/12/20 0407 09/13/20 0300  Weight: (S) 107.8 kg 109.9 kg 116.5 kg    Examination: Constitutional: no acute distress on vent  Eyes: pinpoint, equal, reactive Ears, nose, mouth, and throat: ETT with minimal secretions Cardiovascular: RRR, ext warm Respiratory: Diminished bilaterally, +accessory muscle use Gastrointestinal: Soft, hypoactive BS, rectaltube in place Skin: No rashes, normal turgor Neurologic: withdraws to pain with sedation titration Psychiatric: RASS -4  Plats now up to 30 Remains on PC 10/10, TV between 200-450 ABG looks good on 9 sweep Fibrinogen 326>>408>>539>>695>>672 H/H continued to drift down LDH 743>>1081>>1569>>circuit change>>1294>>1295 Plts 113>>130>>127>>131 CBGs good BUN/Cr up a bit more unfortunately CXR stable severe bilateral infiltrates  Resolved Hospital Problem list  Assessment & Plan:   Critically ill due to acute hypoxemic and hypercarbic respiratory failure secondary to COVID PNA with severe ARDS. S/p VV ECMO cannulation 09/07/20.  Completed remdesivir.  On baricitinib.  Completed CAP coverage. Critically ill due to agitated delirium requiring titration of IV sedation. Elevated Pct question superimposed CAP Bilateral lower ext DVTs- assocated with COVID  infection Steroid-induced hyperglycemia Hypothyroidism, HLD- on home meds Hemolysis- ongoing, circuit changed, has plateau'd somewhat.  Has staph in sputum which could be contributing.  Rheumatologic-associated MAHA remains in differential.  Will address today MRSA tracheiitis vs. HCAP- sensitivities pending  - Vanc x 7 days - Start PLEX today - Discuss volume status with CHF team, likely need to be generous with fluids while having ongoing hemolysis then can work on removal, would not be surprised if ends up on HD - Continue current basal bolus insulin regimen - Continue lung protective vent support, VAP prevention bundle - Continue depakote, seroquel, klonipin - Working on finding good balance between circuit chugging and patient sedation, gets hypertensive/tachycardic with sedation weans increasing fractional shunting which complicates things, very sensitive to beta blockets - Updated wife regarding PLEX and will tentatively plan on tracheostomy tomorrow to facilitate vent weaning  Best practice (evaluated daily)  Diet: TF Pain/Anxiety/Delirium protocol (if indicated): as above VAP protocol (if indicated): yes DVT prophylaxis: Bivalirudin GI prophylaxis: pantoprazole  Glucose control: Subcutaneous insulin basal bolus with good control.   Mobility: bedrest Last date of multidisciplinary goals of care discussion: wife updated over the phone 12/15 Family and staff present RN, physicain Summary of discussion con't aggressive care Follow up goals of care discussion due: 12/22 Code Status: full Disposition: ICU   I personally spent 38 minutes providing critical care not including any separately billable procedures  Myrla Halsted MD [facility] 09/13/2020 11:33 AM Personal pager: [phone] If unanswered, please page CCM On-call: #[phone]

## 2020-09-13 NOTE — Progress Notes (Signed)
Pharmacy Antibiotic Note  Donald Rowe is a 59 y.o. male admitted on 09/07/2020 with pneumonia.  Pharmacy has been consulted for vancomycin dosing.  Completed 7d of cefepime. WBC continues to increase (15.6 today). Scr trending up to 1.61 (holding lasix today).  Afebrile. CXR still showing severe diffuse infiltrates - flows improved with LDH only down marginally since circuit change.   Checked random vancomycin level today given rising Scr = 18.    Plan: Redose vancomycin now - 1250 mg x 1.  Will repeat vancomycin level with AM labs.   Monitor cx results, clinical pic, renal fx, and VT at SS  Height: 5\' 9"  (175.3 cm) Weight: 116.5 kg (256 lb 13.4 oz) IBW/kg (Calculated) : 70.7  Temp (24hrs), Avg:98.2 F (36.8 C), Min:97.7 F (36.5 C), Max:98.6 F (37 C)  Recent Labs  Lab 09/11/20 0503 09/11/20 1615 09/11/20 1618 09/12/20 0352 09/12/20 1551 09/12/20 1554 09/12/20 1825 09/13/20 0304 09/13/20 0320 09/13/20 1157  WBC 15.1* 12.7*  --  17.3* 19.5*  --   --  15.6*  --   --   CREATININE 0.99 1.16  --  1.27* 1.36*  --  1.47* 1.61*  --   --   LATICACIDVEN 1.4  --  1.7 1.1  --  1.7  --   --  2.0*  --   VANCORANDOM  --   --   --   --   --   --   --   --   --  18    Estimated Creatinine Clearance: 62.2 mL/min (A) (by C-G formula based on SCr of 1.61 mg/dL (H)).    No Known Allergies  Antimicrobials this admission: Vancomycin 12/14 >>  Cefepime 12/7 >>  Ceftriaxone 12/5>> 12/6  Dose adjustments this admission: N/A  Microbiology results: 12/4 BCx: ngtd 12/12 Sputum: rare MRSA   12/5 MRSA PCR: neg  Thank you for allowing pharmacy to be a part of this patient's care.  14/5, Reece Leader, BCCP Clinical Pharmacist  09/13/2020 2:10 PM   [facility] pharmacy phone numbers are listed on [email or web address]

## 2020-09-14 ENCOUNTER — Inpatient Hospital Stay (HOSPITAL_COMMUNITY): Payer: HRSA Program

## 2020-09-14 DIAGNOSIS — J9601 Acute respiratory failure with hypoxia: Secondary | ICD-10-CM | POA: Diagnosis not present

## 2020-09-14 DIAGNOSIS — Z515 Encounter for palliative care: Secondary | ICD-10-CM | POA: Diagnosis not present

## 2020-09-14 DIAGNOSIS — J80 Acute respiratory distress syndrome: Secondary | ICD-10-CM | POA: Diagnosis not present

## 2020-09-14 DIAGNOSIS — J1282 Pneumonia due to coronavirus disease 2019: Secondary | ICD-10-CM | POA: Diagnosis not present

## 2020-09-14 DIAGNOSIS — Z7189 Other specified counseling: Secondary | ICD-10-CM | POA: Diagnosis not present

## 2020-09-14 DIAGNOSIS — U071 COVID-19: Secondary | ICD-10-CM | POA: Diagnosis not present

## 2020-09-14 DIAGNOSIS — E871 Hypo-osmolality and hyponatremia: Secondary | ICD-10-CM | POA: Diagnosis not present

## 2020-09-14 LAB — POCT I-STAT 7, (LYTES, BLD GAS, ICA,H+H)
Acid-Base Excess: 3 mmol/L — ABNORMAL HIGH (ref 0.0–2.0)
Acid-Base Excess: 0 mmol/L (ref 0.0–2.0)
Acid-base deficit: 1 mmol/L (ref 0.0–2.0)
Acid-base deficit: 1 mmol/L (ref 0.0–2.0)
Acid-base deficit: 1 mmol/L (ref 0.0–2.0)
Acid-base deficit: 1 mmol/L (ref 0.0–2.0)
Bicarbonate: 27.2 mmol/L (ref 20.0–28.0)
Bicarbonate: 24.6 mmol/L (ref 20.0–28.0)
Bicarbonate: 25.3 mmol/L (ref 20.0–28.0)
Bicarbonate: 25.6 mmol/L (ref 20.0–28.0)
Bicarbonate: 26.3 mmol/L (ref 20.0–28.0)
Bicarbonate: 25.6 mmol/L (ref 20.0–28.0)
Calcium, Ion: 1.26 mmol/L (ref 1.15–1.40)
Calcium, Ion: 1.19 mmol/L (ref 1.15–1.40)
Calcium, Ion: 1.21 mmol/L (ref 1.15–1.40)
Calcium, Ion: 1.23 mmol/L (ref 1.15–1.40)
Calcium, Ion: 1.25 mmol/L (ref 1.15–1.40)
Calcium, Ion: 1.22 mmol/L (ref 1.15–1.40)
HCT: 22 % — ABNORMAL LOW (ref 39.0–52.0)
HCT: 27 % — ABNORMAL LOW (ref 39.0–52.0)
HCT: 26 % — ABNORMAL LOW (ref 39.0–52.0)
HCT: 28 % — ABNORMAL LOW (ref 39.0–52.0)
HCT: 31 % — ABNORMAL LOW (ref 39.0–52.0)
HCT: 31 % — ABNORMAL LOW (ref 39.0–52.0)
Hemoglobin: 7.5 g/dL — ABNORMAL LOW (ref 13.0–17.0)
Hemoglobin: 9.2 g/dL — ABNORMAL LOW (ref 13.0–17.0)
Hemoglobin: 8.8 g/dL — ABNORMAL LOW (ref 13.0–17.0)
Hemoglobin: 10.5 g/dL — ABNORMAL LOW (ref 13.0–17.0)
Hemoglobin: 9.5 g/dL — ABNORMAL LOW (ref 13.0–17.0)
Hemoglobin: 10.5 g/dL — ABNORMAL LOW (ref 13.0–17.0)
O2 Saturation: 80 %
O2 Saturation: 90 %
O2 Saturation: 90 %
O2 Saturation: 84 %
O2 Saturation: 88 %
O2 Saturation: 87 %
Patient temperature: 36.6
Patient temperature: 36.8
Patient temperature: 36.4
Patient temperature: 36.9
Patient temperature: 37
Patient temperature: 36.8
Potassium: 5 mmol/L (ref 3.5–5.1)
Potassium: 5 mmol/L (ref 3.5–5.1)
Potassium: 4.9 mmol/L (ref 3.5–5.1)
Potassium: 5.1 mmol/L (ref 3.5–5.1)
Potassium: 5.1 mmol/L (ref 3.5–5.1)
Potassium: 5.1 mmol/L (ref 3.5–5.1)
Sodium: 146 mmol/L — ABNORMAL HIGH (ref 135–145)
Sodium: 149 mmol/L — ABNORMAL HIGH (ref 135–145)
Sodium: 151 mmol/L — ABNORMAL HIGH (ref 135–145)
Sodium: 151 mmol/L — ABNORMAL HIGH (ref 135–145)
Sodium: 151 mmol/L — ABNORMAL HIGH (ref 135–145)
Sodium: 150 mmol/L — ABNORMAL HIGH (ref 135–145)
TCO2: 28 mmol/L (ref 22–32)
TCO2: 26 mmol/L (ref 22–32)
TCO2: 27 mmol/L (ref 22–32)
TCO2: 27 mmol/L (ref 22–32)
TCO2: 28 mmol/L (ref 22–32)
TCO2: 27 mmol/L (ref 22–32)
pCO2 arterial: 40.2 mmHg (ref 32.0–48.0)
pCO2 arterial: 43 mmHg (ref 32.0–48.0)
pCO2 arterial: 44.3 mmHg (ref 32.0–48.0)
pCO2 arterial: 50 mmHg — ABNORMAL HIGH (ref 32.0–48.0)
pCO2 arterial: 53.9 mmHg — ABNORMAL HIGH (ref 32.0–48.0)
pCO2 arterial: 47.4 mmHg (ref 32.0–48.0)
pH, Arterial: 7.436 (ref 7.350–7.450)
pH, Arterial: 7.364 (ref 7.350–7.450)
pH, Arterial: 7.362 (ref 7.350–7.450)
pH, Arterial: 7.296 — ABNORMAL LOW (ref 7.350–7.450)
pH, Arterial: 7.317 — ABNORMAL LOW (ref 7.350–7.450)
pH, Arterial: 7.339 — ABNORMAL LOW (ref 7.350–7.450)
pO2, Arterial: 42 mmHg — ABNORMAL LOW (ref 83.0–108.0)
pO2, Arterial: 61 mmHg — ABNORMAL LOW (ref 83.0–108.0)
pO2, Arterial: 58 mmHg — ABNORMAL LOW (ref 83.0–108.0)
pO2, Arterial: 53 mmHg — ABNORMAL LOW (ref 83.0–108.0)
pO2, Arterial: 62 mmHg — ABNORMAL LOW (ref 83.0–108.0)
pO2, Arterial: 57 mmHg — ABNORMAL LOW (ref 83.0–108.0)

## 2020-09-14 LAB — BASIC METABOLIC PANEL
Anion gap: 10 (ref 5–15)
Anion gap: 10 (ref 5–15)
BUN: 73 mg/dL — ABNORMAL HIGH (ref 6–20)
BUN: 71 mg/dL — ABNORMAL HIGH (ref 6–20)
CO2: 23 mmol/L (ref 22–32)
CO2: 24 mmol/L (ref 22–32)
Calcium: 7.9 mg/dL — ABNORMAL LOW (ref 8.9–10.3)
Calcium: 8.1 mg/dL — ABNORMAL LOW (ref 8.9–10.3)
Chloride: 113 mmol/L — ABNORMAL HIGH (ref 98–111)
Chloride: 116 mmol/L — ABNORMAL HIGH (ref 98–111)
Creatinine, Ser: 1.46 mg/dL — ABNORMAL HIGH (ref 0.61–1.24)
Creatinine, Ser: 1.33 mg/dL — ABNORMAL HIGH (ref 0.61–1.24)
GFR, Estimated: 55 mL/min — ABNORMAL LOW (ref >60)
GFR, Estimated: >60 mL/min (ref >60)
Glucose, Bld: 205 mg/dL — ABNORMAL HIGH (ref 70–99)
Glucose, Bld: 155 mg/dL — ABNORMAL HIGH (ref 70–99)
Potassium: 4.9 mmol/L (ref 3.5–5.1)
Potassium: 5.2 mmol/L — ABNORMAL HIGH (ref 3.5–5.1)
Sodium: 146 mmol/L — ABNORMAL HIGH (ref 135–145)
Sodium: 150 mmol/L — ABNORMAL HIGH (ref 135–145)

## 2020-09-14 LAB — HEPATIC FUNCTION PANEL
ALT: 27 U/L (ref 0–44)
AST: 38 U/L (ref 15–41)
Albumin: 3.1 g/dL — ABNORMAL LOW (ref 3.5–5.0)
Alkaline Phosphatase: 102 U/L (ref 38–126)
Bilirubin, Direct: 1.2 mg/dL — ABNORMAL HIGH (ref 0.0–0.2)
Indirect Bilirubin: 1 mg/dL — ABNORMAL HIGH (ref 0.3–0.9)
Total Bilirubin: 2.2 mg/dL — ABNORMAL HIGH (ref 0.3–1.2)
Total Protein: 4.5 g/dL — ABNORMAL LOW (ref 6.5–8.1)

## 2020-09-14 LAB — CBC
HCT: 28.2 % — ABNORMAL LOW (ref 39.0–52.0)
HCT: 29.7 % — ABNORMAL LOW (ref 39.0–52.0)
Hemoglobin: 9.6 g/dL — ABNORMAL LOW (ref 13.0–17.0)
Hemoglobin: 9.9 g/dL — ABNORMAL LOW (ref 13.0–17.0)
MCH: 32.2 pg (ref 26.0–34.0)
MCH: 32.1 pg (ref 26.0–34.0)
MCHC: 34 g/dL (ref 30.0–36.0)
MCHC: 33.3 g/dL (ref 30.0–36.0)
MCV: 94.6 fL (ref 80.0–100.0)
MCV: 96.4 fL (ref 80.0–100.0)
Platelets: 135 10*3/uL — ABNORMAL LOW (ref 150–400)
Platelets: 153 10*3/uL (ref 150–400)
RBC: 2.98 MIL/uL — ABNORMAL LOW (ref 4.22–5.81)
RBC: 3.08 MIL/uL — ABNORMAL LOW (ref 4.22–5.81)
RDW: 16.4 % — ABNORMAL HIGH (ref 11.5–15.5)
RDW: 16.7 % — ABNORMAL HIGH (ref 11.5–15.5)
WBC: 18.8 10*3/uL — ABNORMAL HIGH (ref 4.0–10.5)
WBC: 22 10*3/uL — ABNORMAL HIGH (ref 4.0–10.5)
nRBC: 0 % (ref 0.0–0.2)
nRBC: 0.2 % (ref 0.0–0.2)

## 2020-09-14 LAB — GLUCOSE, CAPILLARY
Glucose-Capillary: 147 mg/dL — ABNORMAL HIGH (ref 70–99)
Glucose-Capillary: 149 mg/dL — ABNORMAL HIGH (ref 70–99)
Glucose-Capillary: 155 mg/dL — ABNORMAL HIGH (ref 70–99)
Glucose-Capillary: 155 mg/dL — ABNORMAL HIGH (ref 70–99)
Glucose-Capillary: 195 mg/dL — ABNORMAL HIGH (ref 70–99)
Glucose-Capillary: 197 mg/dL — ABNORMAL HIGH (ref 70–99)
Glucose-Capillary: 239 mg/dL — ABNORMAL HIGH (ref 70–99)

## 2020-09-14 LAB — PROTIME-INR
INR: 3 — ABNORMAL HIGH (ref 0.8–1.2)
Prothrombin Time: 30.1 seconds — ABNORMAL HIGH (ref 11.4–15.2)

## 2020-09-14 LAB — APTT
aPTT: 65 seconds — ABNORMAL HIGH (ref 24–36)
aPTT: 47 seconds — ABNORMAL HIGH (ref 24–36)
aPTT: 104 seconds — ABNORMAL HIGH (ref 24–36)
aPTT: 53 seconds — ABNORMAL HIGH (ref 24–36)

## 2020-09-14 LAB — VANCOMYCIN, RANDOM: Vancomycin Rm: 18

## 2020-09-14 LAB — LACTIC ACID, PLASMA
Lactic Acid, Venous: 1.7 mmol/L (ref 0.5–1.9)
Lactic Acid, Venous: 1.6 mmol/L (ref 0.5–1.9)

## 2020-09-14 LAB — LACTATE DEHYDROGENASE: LDH: 685 U/L — ABNORMAL HIGH (ref 98–192)

## 2020-09-14 LAB — FIBRINOGEN: Fibrinogen: 239 mg/dL (ref 210–475)

## 2020-09-14 LAB — AMMONIA: Ammonia: 48 umol/L — ABNORMAL HIGH (ref 9–35)

## 2020-09-14 MED ORDER — CLEVIDIPINE BUTYRATE 0.5 MG/ML IV EMUL
0.0000 mg/h | INTRAVENOUS | Status: DC
  Administered 2020-09-14: 18:00:00 11 mg/h via INTRAVENOUS
  Administered 2020-09-14: 22:00:00 9 mg/h via INTRAVENOUS
  Administered 2020-09-14: 16:00:00 1 mg/h via INTRAVENOUS
  Administered 2020-09-15 (×3): 9 mg/h via INTRAVENOUS
  Administered 2020-09-16: 17:00:00 3 mg/h via INTRAVENOUS
  Administered 2020-09-17: 11:00:00 4 mg/h via INTRAVENOUS
  Filled 2020-09-14 (×11): qty 50

## 2020-09-14 MED ORDER — LACTULOSE 10 GM/15ML PO SOLN
20.0000 g | Freq: Two times a day (BID) | ORAL | Status: DC
  Administered 2020-09-14 – 2020-09-20 (×13): 20 g
  Filled 2020-09-14 (×13): qty 30

## 2020-09-14 MED ORDER — CLONIDINE HCL 0.1 MG PO TABS
0.1000 mg | ORAL_TABLET | Freq: Two times a day (BID) | ORAL | Status: DC
  Administered 2020-09-14: 10:00:00 0.1 mg via ORAL
  Filled 2020-09-14: qty 1

## 2020-09-14 MED ORDER — AMLODIPINE BESYLATE 5 MG PO TABS
5.0000 mg | ORAL_TABLET | Freq: Every day | ORAL | Status: DC
  Administered 2020-09-14: 15:00:00 5 mg
  Filled 2020-09-14: qty 1

## 2020-09-14 MED ORDER — VANCOMYCIN HCL IN DEXTROSE 1-5 GM/200ML-% IV SOLN
1000.0000 mg | Freq: Two times a day (BID) | INTRAVENOUS | Status: AC
  Administered 2020-09-14 – 2020-09-18 (×9): 1000 mg via INTRAVENOUS
  Filled 2020-09-14 (×9): qty 200

## 2020-09-14 MED ORDER — CLONIDINE HCL 0.1 MG PO TABS
0.1000 mg | ORAL_TABLET | Freq: Two times a day (BID) | ORAL | Status: DC
  Administered 2020-09-14 – 2020-09-15 (×3): 0.1 mg
  Filled 2020-09-14 (×3): qty 1

## 2020-09-14 NOTE — Progress Notes (Signed)
Labs still not resulted called lab for more info- lab tech said she walked sample over to the machine and it is in process. Still having issues with machines failing.

## 2020-09-14 NOTE — Progress Notes (Signed)
Called lab to check on delayed lab results.

## 2020-09-14 NOTE — Progress Notes (Signed)
Assisted tele visit to patient with family member.  Shaelynn Dragos P, RN  

## 2020-09-14 NOTE — Progress Notes (Signed)
ANTICOAGULATION CONSULT NOTE  Pharmacy Consult for bivalirudin Indication: ECMO + bilateral DVTs   Recent Labs    09/12/20 0352 09/12/20 0403 09/13/20 0304 09/13/20 0325 09/13/20 1157 09/13/20 1206 09/13/20 1700 09/13/20 2016 09/13/20 2100 09/14/20 0352 09/14/20 0401  HGB 9.5*   < > 8.5*   < >  --    < > 7.9* 7.5*  --  9.6* 9.2*  HCT 28.8*   < > 26.7*   < >  --    < > 25.0* 22.0*  --  28.2* 27.0*  PLT 130*   < > 131*  --   --   --  139*  --   --  135*  --   APTT 69*   < > 78*  --  75*  --  62*  --   --  65*  --   LABPROT 28.7*  --  26.4*  --   --   --   --   --   --  30.1*  --   INR 2.8*  --  2.5*  --   --   --   --   --   --  3.0*  --   CREATININE 1.27*   < > 1.61*  --   --   --  1.57*  --  1.56* 1.46*  --    < > = values in this interval not displayed.    Estimated Creatinine Clearance: 69.5 mL/min (A) (by C-G formula based on SCr of 1.46 mg/dL (H)).   Assessment: 18 yom unvaccinated presenting with severe COVID ARDs, intubated on 12/6, continued to have ventilation issues despite NMB and proning - started on VV ECMO on 12/8.  D-dimer >20 > now improved 14,  found to have bilateral DVTs.   aPTT this evening therapeutic at 65 seconds on  bivalirudin 0.18mg /kg/hr. CBC stable. Hematuria resolved, LDH improved after plasmapheresis. Fibrinogen down 600>200s.  No active bleed issues reported.  Goal of Therapy:  aPTT 60-70 seconds Monitor platelets by anticoagulation protocol: Yes   Plan:  Continue bivalirudin at 0.18 mg/kg/h (dosing wt 103kg) Check aPTT q12h Monitor CBC, s/sx bleeding   Leota Sauers Pharm.D. CPP, BCPS Clinical Pharmacist [phone] 09/14/2020 8:59 AM    Clinical Pharmacist 09/14/2020 8:36 AM

## 2020-09-14 NOTE — Progress Notes (Addendum)
ANTICOAGULATION CONSULT NOTE  Pharmacy Consult for bivalirudin Indication: ECMO + bilateral DVTs   Recent Labs    09/12/20 0352 09/12/20 0403 09/13/20 0304 09/13/20 0325 09/13/20 1700 09/13/20 2016 09/13/20 2100 09/14/20 0352 09/14/20 0401 09/14/20 1153 09/14/20 1645  HGB 9.5*   < > 8.5*   < > 7.9*   < >  --  9.6* 9.2* 8.8* 9.9*  HCT 28.8*   < > 26.7*   < > 25.0*   < >  --  28.2* 27.0* 26.0* 29.7*  PLT 130*   < > 131*  --  139*  --   --  135*  --   --  153  APTT 69*   < > 78*   < > 62*  --   --  65*  --   --  47*  LABPROT 28.7*  --  26.4*  --   --   --   --  30.1*  --   --   --   INR 2.8*  --  2.5*  --   --   --   --  3.0*  --   --   --   CREATININE 1.27*   < > 1.61*  --  1.57*  --  1.56* 1.46*  --   --  1.33*   < > = values in this interval not displayed.    Estimated Creatinine Clearance: 76.3 mL/min (A) (by C-G formula based on SCr of 1.33 mg/dL (H)).   Assessment: 55 yom unvaccinated presenting with severe COVID ARDs, intubated on 12/6, continued to have ventilation issues despite NMB and proning - started on VV ECMO on 12/8.  D-dimer >20 > now improved 14,  found to have bilateral DVTs.   Bivalirudin held from 1000-1323 due to trach placement today. aPTT this evening is subtherapeutic at 47 seconds on bivalirudin 0.18mg /kg/hr. CBC stable. Hematuria resolved, LDH improved after plasmapheresis. Fibrinogen down 600>200s. Pt is having bleeding from mouth and in ET tube - MD and team aware to monitor closely.  Goal of Therapy:  aPTT 60-70 seconds Monitor platelets by anticoagulation protocol: Yes   Plan:  Given active bilateral DVTs and subtherapeutic aPTT, will increase bivalirudin slightly (by 10%) to 0.2 mg/kg/h (dosing wt 103kg) Check aPTT in 4 hours  Monitor CBC, s/sx bleeding  Sherron Monday, PharmD, BCCCP Clinical Pharmacist  Phone: [phone] 09/14/2020 6:16 PM  Please check AMION for all [facility] Pharmacy phone numbers After 10:00 PM, call Main Pharmacy  [phone]   ADDENDUM APTT tonight came back supratherapeutic at 104 - will repeat lab valve prior to adjustment. Still bloody secretions when suctioning (improving) but bleeding in mouth stabilized/improved. Scr improved slightly to 1.33, uop stable. Repeat aPTT came back at 53 - given bleeding stabilized, will increase by 10% to 0.22 mg/kg/hr.   Sherron Monday, PharmD, BCCCP Clinical Pharmacist

## 2020-09-14 NOTE — Progress Notes (Signed)
Pharmacy Antibiotic Note  Donald Rowe is a 59 y.o. male admitted on 09/06/2020 with pneumonia.  Pharmacy has been consulted for vancomycin dosing.  Completed 7d of cefepime. WBC continues to increase (18.8 today). Scr trending down to 1.46.  Afebrile. CXR still showing severe diffuse infiltrates - flows improved with LDH only down marginally since circuit change.   Checked random vancomycin level again this AM given rising Scr = 18.    Plan: Start vancomycin 1g IV q 12 hrs. Will watch renal function closely and will adjust closely. Monitor cx results, clinical pic, renal fx, and VT at SS  Height: 5\' 9"  (175.3 cm) Weight: 119.5 kg (263 lb 7.2 oz) IBW/kg (Calculated) : 70.7  Temp (24hrs), Avg:98.3 F (36.8 C), Min:97.88 F (36.6 C), Max:99.5 F (37.5 C)  Recent Labs  Lab 09/12/20 0352 09/12/20 1551 09/12/20 1554 09/12/20 1825 09/13/20 0304 09/13/20 0320 09/13/20 1157 09/13/20 1700 09/13/20 1735 09/13/20 2100 09/14/20 0352  WBC 17.3* 19.5*  --   --  15.6*  --   --  17.5*  --   --  18.8*  CREATININE 1.27* 1.36*  --  1.47* 1.61*  --   --  1.57*  --  1.56* 1.46*  LATICACIDVEN 1.1  --  1.7  --   --  2.0*  --   --  1.6  --  1.7  VANCORANDOM  --   --   --   --   --   --  18  --   --   --  18    Estimated Creatinine Clearance: 69.5 mL/min (A) (by C-G formula based on SCr of 1.46 mg/dL (H)).    No Known Allergies  Antimicrobials this admission: Vancomycin 12/14 >>  Cefepime 12/7 >>  Ceftriaxone 12/5>> 12/6  Dose adjustments this admission: N/A  Microbiology results: 12/4 BCx: neg 12/12 Sputum: rare MRSA   12/5 MRSA PCR: neg  Thank you for allowing pharmacy to be a part of this patient's care.  14/5, Reece Leader, BCCP Clinical Pharmacist  09/14/2020 3:24 PM   [facility] pharmacy phone numbers are listed on [email or web address]

## 2020-09-14 NOTE — Progress Notes (Signed)
OT Cancellation Note  Patient Details Name: Donald Rowe MRN: [id number] DOB: Jul 21, 1961   Cancelled Treatment:    Reason Eval/Treat Not Completed: Patient at procedure or test/ unavailable.  Continue efforts in the acute setting.    Kelisha Dall D Bao Bazen 09/14/2020, 11:24 AM

## 2020-09-14 NOTE — Progress Notes (Signed)
Patient ID: Donald Rowe, male   DOB: [birth year], 59 y.o.   MRN: [id number] [facility] Progress Note   Assessment/ Plan:   1.  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    Nonoliguric and with slight improvement of creatinine overnight likely is an effect of plasma exchange; will continue to monitor for indicators of dialysis. 2.  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 3.  Acute hypoxic respiratory failure: Secondary to COVID-19 infection, on VV ECMO and attempting strategies to optimize ventilation and oxygenation. 4.  Bilateral lower extremity DVTs: Secondary to prothrombotic state in COVID-19 infection.  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 5.  Healthcare associated pneumonia/MRSA tracheitis: Continue on intravenous vancomycin.  Subjective:   Without acute clinical events overnight, tolerated plasmapheresis without problems.   Objective:   BP (!) 180/64   Pulse 87   Temp 98.06 F (36.7 C)   Resp 10   Ht $R'5\' 9"'YV$  (1.753 m)   Wt 119.5 kg   SpO2 98%   BMI 38.90 kg/m   Intake/Output Summary (Last 24 hours) at 09/14/2020 0845 Last data filed at 09/14/2020 0600 Gross per 24 hour  Intake 5585.69 ml  Output 3305 ml  Net 2280.69 ml   Weight change: 3 kg  Physical Exam: Gen: Intubated, sedated CVS: Pulse regular rhythm, normal rate, S1 and S2 normal.  Right IJ ECMO. Resp: Anteriorly coarse breath sounds bilaterally, no distinct rales/wheeze Abd: Soft, obese, nontender, bowel sounds normal Ext: Trace-1+ upper and lower extremity edema  Imaging: DG CHEST PORT 1 VIEW  Result Date: 09/14/2020 CLINICAL DATA:  COVID-19 positive. EXAM: PORTABLE CHEST 1 VIEW COMPARISON:  September 13, 2020. FINDINGS: Endotracheal tube and  feeding tube are in good position. ECMO device is unchanged. Left internal jugular catheter is unchanged. Stable diffuse bilateral lung opacities are noted consistent with pneumonia. No pneumothorax is noted. Bony thorax is unremarkable. IMPRESSION: Stable support apparatus. Stable diffuse bilateral lung opacities are noted consistent with pneumonia. Electronically Signed   By: Marijo Conception M.D.   On: 09/14/2020 08:04   DG CHEST PORT 1 VIEW  Result Date: 09/13/2020 CLINICAL DATA:  COVID-19 positive. EXAM: PORTABLE CHEST 1 VIEW COMPARISON:  September 12, 2020. FINDINGS: Stable position of endotracheal and feeding tubes. Left internal jugular catheter is unchanged. ECMO is unchanged. Stable bilateral diffuse lung opacities are noted consistent with pneumonia. No pneumothorax is noted. Bony thorax is unremarkable. IMPRESSION: Stable support apparatus. Stable bilateral diffuse lung opacities are noted consistent with pneumonia. Electronically Signed   By: Marijo Conception M.D.   On: 09/13/2020 08:11    Labs: BMET Recent Labs  Lab 09/08/20 0343 09/08/20 0351 09/09/20 0458 09/09/20 0501 09/11/20 1615 09/11/20 1617 09/12/20 0352 09/12/20 0403 09/12/20 1551 09/12/20 1637 09/12/20 1825 09/12/20 2038 09/13/20 0304 09/13/20 0325 09/13/20 1206 09/13/20 1700 09/13/20 2016 09/13/20 2100 09/14/20 0352 09/14/20 0401  NA 146*   < > 148*   < > 146*   < > 147*   < > 145   < > 145   < > 147* 146* 148* 145 146* 144 146* 149*  K 4.4   < > 4.4   < > 3.9   < > 4.7   < > 5.9*   < > 5.9*   < > 5.0 4.7 4.7 4.8 5.0 4.8 4.9 5.0  CL 116*   < >  118*   < > 111  --  111  --  111  --  111  --  111  --   --  112*  --  112* 113*  --   CO2 23   < > 25   < > 25  --  28  --  27  --  26  --  26  --   --  25  --  26 23  --   GLUCOSE 231*   < > 231*   < > 142*  --  133*  --  172*  --  193*  --  215*  --   --  220*  --  237* 205*  --   BUN 40*   < > 40*   < > 51*  --  55*  --  61*  --  67*  --  75*  --   --  77*  --  80*  73*  --   CREATININE 1.04   < > 0.94   < > 1.16  --  1.27*  --  1.36*  --  1.47*  --  1.61*  --   --  1.57*  --  1.56* 1.46*  --   CALCIUM 7.5*   < > 7.9*   < > 7.7*  --  8.0*  --  8.0*  --  8.1*  --  8.4*  --   --  8.0*  --  8.1* 7.9*  --   PHOS 1.8*  --  2.1*  --  3.4  --   --   --   --   --   --   --   --   --   --   --   --   --   --   --    < > = values in this interval not displayed.   CBC Recent Labs  Lab 09/12/20 1551 09/12/20 1637 09/13/20 0304 09/13/20 0325 09/13/20 1700 09/13/20 2016 09/14/20 0352 09/14/20 0401  WBC 19.5*  --  15.6*  --  17.5*  --  18.8*  --   HGB 9.2*   < > 8.5*   < > 7.9* 7.5* 9.6* 9.2*  HCT 29.3*   < > 26.7*   < > 25.0* 22.0* 28.2* 27.0*  MCV 96.1  --  95.4  --  95.8  --  94.6  --   PLT 127*  --  131*  --  139*  --  135*  --    < > = values in this interval not displayed.    Medications:    . aspirin  81 mg Per Tube Daily  . chlorhexidine gluconate (MEDLINE KIT)  15 mL Mouth Rinse BID  . Chlorhexidine Gluconate Cloth  6 each Topical Daily  . clonazePAM  1 mg Per Tube BID  . cloNIDine  0.1 mg Oral BID  . docusate  100 mg Per Tube BID  . etomidate  40 mg Intravenous Once  . feeding supplement (PIVOT 1.5 CAL)  1,000 mL Per Tube Q24H  . feeding supplement (PROSource TF)  45 mL Per Tube QID  . fentaNYL (SUBLIMAZE) injection  200 mcg Intravenous Once  . fiber  1 packet Per Tube BID  . free water  200 mL Per Tube Q2H  . insulin aspart  2-6 Units Subcutaneous Q4H  . insulin aspart  8 Units Subcutaneous Q4H  . insulin detemir  24 Units Subcutaneous Q12H  . lactulose  20 g Per Tube BID  . levothyroxine  50 mcg Per Tube Q0600  . mouth rinse  15 mL Mouth Rinse 10 times per day  . methylPREDNISolone (SOLU-MEDROL) injection  60 mg Intravenous Q6H  . midazolam  5 mg Intravenous Once  . oxyCODONE  5 mg Per Tube Q6H  . pantoprazole sodium  40 mg Per Tube Daily  . pravastatin  40 mg Per Tube q1800  . QUEtiapine  100 mg Per Tube BID  . sodium chloride  flush  10-40 mL Intracatheter Q12H  . valproic acid  500 mg Per Tube BID  . vecuronium  10 mg Intravenous Once   Elmarie Shiley, MD 09/14/2020, 8:45 AM

## 2020-09-14 NOTE — Progress Notes (Signed)
Palliative:  Donald Rowe goes by "[name]" not Donald Rowe not Donald Rowe (he hates these names per wife).  HPI:59 y.o.malewith a pertinent history ofhypothyroidism on levothyroxine and hyperlipidemia who was diagnosed with Covid 1 week ago and is not vaccinated. With EMS he was seen to bedesaturatingto 78% on room air. He states he started having symptoms on Friday and tested positive on Saturday.Had initially elected to be DNAR without intubation though later changed this and got intubated on 12/6 in the setting of worsening respiratory failure with hypoxia. CTA chest w/diffuse bilateral infiltrates.On 12/8 was cannulated for VV ECMO.Had been extubated on 12/9 then emergently reintubated in early eveningdue to clinical instability. Palliative care was asked to get involved in the setting of VV ECMO to set goals and expectations withJ.R.'sfamily.He continues on VV ECMO and ventilator support.ECMO circuit change 09/11/20.  [name] continues to be sedated on vent with VV ECMO and tolerating. Continues with struggles with attempts to lighten sedation. LDH improved after PLEX. Plans for trach placement today. Awaiting approval from ID to remove isolation precautions 12/18 instead of 12/25 (based on out of facility +COVID 11/27 in CareEverywhere). Overall prognosis remains guarded.   I received call back from daughter, Donald Rowe, today. We discussed his treatment plan and that the team is pleased with results from PLEX. Plans for trach. She has many questions regarding medications and we reviewed. She had questions about replacing Protonix with Pepcid and adding budesonide as well. Will leave medication changes or additions to heart failure and PCCM.   We did discussed that he does continue to be critically ill. Donald Rowe expresses concern that he does not seem to be making improvements and I agree that he has not shown much progress which is concerning. We discussed that our current hopes would be keep  him stabilized and begin to see small improvements and certainly not further complications. Difficult to determine prognosis at this stage but he does remain very ill and has a long ways to go.   Exam: Sedated on vent. VV ECMO. Cortrak in place.No distress.40% FiO2 and tolerating ventwith no dyssynchrony.Abd flat.  Plan: - Daughter has some questions and concerns about medications which I will leave for PCCM/Heart Failure to address.  - Continue aggressive care with hopes of eventual improvement.   40 min  Yong Channel, NP Palliative Medicine Team Pager [phone] (Please see [email or web address] for schedule) Team Phone [phone]    Greater than 50%  of this time was spent counseling and coordinating care related to the above assessment and plan

## 2020-09-14 NOTE — Procedures (Signed)
Extracorporeal support note     ECLS cannulation: 2020-09-13 Last circuit change: 09/11/2020 Indication: COVD ARDS   Configuration: VV, RIJ 32Fr [place]   Pump speed: 4065 Pump flow: 4.96 Pump used: Cardiohelp   Sweep gas: 100%, 9LPM   Circuit check: no clots Anticoagulant: bivalirudin Anticoagulation targets: PTT 50-70 -6-hour hold for bloody endotracheal secretions   Changes in support: Received Plax yesterday, continue current support.  Does not appear he will be able to tolerate awake ECMO due to delirium.    Anticipated goals/duration of support: bridge to recovery  Cheri Fowler MD [facility] Pager: [phone] Mobile: [phone]

## 2020-09-14 NOTE — Procedures (Signed)
Bedside Tracheostomy Insertion Procedure Note   Patient Details:   Name: Donald Rowe DOB: 1960-11-08 MRN: [id number]  Procedure: Tracheostomy  Pre Procedure Assessment: ET Tube Size:8 ET Tube secured at lip (cm):26 Bite block in place: No Breath Sounds: Diminished  Post Procedure Assessment: BP (!) 135/47 Comment (BP Location): a-line  Pulse 92   Temp 97.88 F (36.6 C) (Core)   Resp 12   Ht 5\' 9"  (1.753 m)   Wt 119.5 kg   SpO2 96%   BMI 38.90 kg/m  O2 sats: stable throughout Complications: No apparent complications Patient did tolerate procedure well Tracheostomy Brand:Shiley Tracheostomy Style:Cuffed Tracheostomy Size: 8 Tracheostomy Secured Tracheostomy Placement Confirmation:Trach cuff visualized and in place and Chest X ray ordered for placement    ERQ:[id number] 09/14/2020, 12:56 PM

## 2020-09-14 NOTE — Procedures (Signed)
Bronchoscopy Procedure Note  Donald Rowe  [id number]  1961-05-08  Date:09/14/20  Time:11:58 AM   Provider Performing:Aayush Gelpi   Procedure(s):  Flexible Bronchoscopy [phone]) and Initial Therapeutic Aspiration of Tracheobronchial Tree (11552)  Indication(s) Assist with tracheostomy, aspiration of tracheobronchial tree  Consent Risks of the procedure as well as the alternatives and risks of each were explained to the patient and/or caregiver.  Consent for the procedure was obtained and is signed in the bedside chart  Anesthesia Etomidate and rocuronium   Time Out Verified patient identification, verified procedure, site/side was marked, verified correct patient position, special equipment/implants available, medications/allergies/relevant history reviewed, required imaging and test results available.   Sterile Technique Usual hand hygiene, masks, gowns, and gloves were used   Procedure Description Bronchoscope advanced through endotracheal tube and into airway.  Airways were examined down to subsegmental level with findings noted below.   Following diagnostic evaluation, Therapeutic aspiration performed in Right lower and middle lobe  Findings: Thick copious/bloody secretions noted in the right upper, middle and lower lobe, suction   Complications/Tolerance None; patient tolerated the procedure well. Chest X-ray is needed post procedure.   EBL Minimal   Specimen(s) N/A

## 2020-09-14 NOTE — Progress Notes (Signed)
PT Cancellation Note  Patient Details Name: Donald Rowe MRN: [id number] DOB: Jan 02, 1961   Cancelled Treatment:    Reason Eval/Treat Not Completed: Medical issues which prohibited therapy (Pt getting ready for trach. Nurse asked PT to HOLD.)   Berline Lopes 09/14/2020, 11:07 AM Alyxandra Tenbrink W,PT Acute Rehabilitation Services Pager:  [phone]  Office:  [phone]

## 2020-09-14 NOTE — Progress Notes (Signed)
Called lab again regarding delay in results for BMP, hepatic function, and LDH. Lab reports malfunction in machine and should be resulting shortly.

## 2020-09-14 NOTE — Progress Notes (Signed)
Assisted tele visit to patient with wife.  Naquan Garman P, RN  

## 2020-09-14 NOTE — Progress Notes (Addendum)
[facility] faxed records showing positive COVID results dated for 08/26/20. Please see hard chart.

## 2020-09-14 NOTE — Progress Notes (Signed)
Error

## 2020-09-14 NOTE — Plan of Care (Signed)
  Problem: Clinical Measurements: Goal: Diagnostic test results will improve Outcome: Progressing Goal: Signs and symptoms of infection will decrease Outcome: Progressing   Problem: Clinical Measurements: Goal: Ability to maintain clinical measurements within normal limits will improve Outcome: Progressing   Problem: Nutrition: Goal: Adequate nutrition will be maintained Outcome: Progressing

## 2020-09-14 NOTE — Progress Notes (Signed)
NAME:  Donald Rowe, MRN:  [id number], DOB:  05/21/1961, LOS: 12 ADMISSION DATE:  09-22-20, CONSULTATION DATE:  12/6 REFERRING MD:  Dr. David Stall, CHIEF COMPLAINT:  SOB    Brief History   59 y/o M admitted 12/4 with 1 week hx of SOB, known COVID positive.  He is unvaccinated.  CTA chest negative for PE but demonstrated diffuse bilateral infiltrates,  treated with IV remdesivir, steroids and baricitinib.  Initially, he was also treated with antibiotics but these were stopped on 12/6 with a low procalcitonin.  Past Medical History  Hypothyroidism  HLD   Significant Hospital Events/Procedures  12/04 Admit  12/06 PCCM consulted  12/07 Intubated, central line, a line 12/08 VV ECMO Cannulaton, cortrak 12/13 circuit change for hemolytic anemia 12/16 tracheostomy  Consults:  PCCM, Heart failure, TCTS  Significant Diagnostic Tests:   CTA Chest 12/4 >> extensive bilateral airspace disease, no large PE identified, limited study   LE Venous Duplex 12/4 >> negative for DVT bilaterally   Micro Data:  COVID 12/4 >> negative  Influenza A/B 12/4 >> negative  MRSA PCR 12/5 >> negative  BCx2 12/4 >>   Antimicrobials:  Azithromycin 12/5 >> 12/6  Ceftriaxone 12/5 >> 12/6   Interim history/subjective:  Patient remained heavily sedated on propofol, Precedex, fentanyl and Versed, still double triggering on the vent  Objective   Blood pressure (!) 188/69, pulse 91, temperature 97.88 F (36.6 C), resp. rate 19, height 5\' 9"  (1.753 m), weight 119.5 kg, SpO2 96 %.    Vent Mode: PCV FiO2 (%):  [40 %-60 %] 40 % Set Rate:  [12 bmp] 12 bmp PEEP:  [10 cmH20] 10 cmH20 Plateau Pressure:  [16 cmH20-19 cmH20] 16 cmH20   Intake/Output Summary (Last 24 hours) at 09/14/2020 1112 Last data filed at 09/14/2020 1000 Gross per 24 hour  Intake 5518.09 ml  Output 3780 ml  Net 1738.09 ml   Filed Weights   09/12/20 0407 09/13/20 0300 09/14/20 0500  Weight: 109.9 kg 116.5 kg 119.5 kg     Examination: Constitutional: Middle-age male, lying on the bed, orally intubated Eyes: pinpoint, equal, reactive Ears, nose, mouth, and throat: ETT with minimal secretions Cardiovascular: RRR, ext warm Respiratory: Diminished bilaterally, +accessory muscle use Gastrointestinal: Soft, hypoactive BS, rectaltube in place Skin: No rashes, normal turgor Neurologic: withdraws to pain with sedation titration Psychiatric: RASS -4  Resolved Hospital Problem list      Assessment & Plan:   Critically ill due to acute hypoxemic and hypercarbic respiratory failure secondary to COVID PNA with severe ARDS. S/p VV ECMO cannulation 09/05/2020.  Completed remdesivir.  On baricitinib.  Completed CAP coverage. Continue lung protective ventilation Plan for tracheostomy today  Critically ill due to agitated delirium requiring titration of IV sedation Currently on propofol, fentanyl, Versed and Precedex We will stop propofol and Precedex infusion Continue Versed and fentanyl with RASS goal -3/-4 for now Continue Depakote, Seroquel and Klonopin  MRSA pneumonia Patient's respiratory culture grew MRSA Continue vancomycin to complete 7 days of therapy  Bilateral lower ext DVTs- assocated with COVID infection Currently on bivalirudin infusion  Steroid-induced hyperglycemia Continue Lantus and sliding scale insulin  Hypothyroidism, HLD- on home meds  Hemolysis- ongoing, circuit changed, has plateau'd somewhat.   Received Plex yesterday LDH has trended down H&H is stable now  Uncontrolled hypertension Started on clonidine 0.1 mg twice daily  Best practice (evaluated daily)  Diet: TF Pain/Anxiety/Delirium protocol (if indicated): as above VAP protocol (if indicated): yes DVT prophylaxis: Bivalirudin GI prophylaxis: pantoprazole  Glucose control: Subcutaneous insulin basal bolus with good control.   Mobility: bedrest Last date of multidisciplinary goals of care discussion: wife updated over  the phone 12/15 Family and staff present RN, physicain Summary of discussion con't aggressive care Follow up goals of care discussion due: 12/22 Code Status: full Disposition: ICU   Total critical care time: 39 minutes  Performed by: Cheri Fowler   Critical care time was exclusive of separately billable procedures and treating other patients.   Critical care was necessary to treat or prevent imminent or life-threatening deterioration.   Critical care was time spent personally by me on the following activities: development of treatment plan with patient and/or surrogate as well as nursing, discussions with consultants, evaluation of patient's response to treatment, examination of patient, obtaining history from patient or surrogate, ordering and performing treatments and interventions, ordering and review of laboratory studies, ordering and review of radiographic studies, pulse oximetry and re-evaluation of patient's condition.   Cheri Fowler MD [facility] Pager: [phone] Mobile: [phone]

## 2020-09-14 NOTE — Procedures (Signed)
Percutaneous Tracheostomy Procedure Note   Donald Rowe  [id number]  1961-09-29  Date:09/14/20  Time:1:31 PM   Provider Performing:Amori Colomb C Katrinka Blazing  Procedure: Percutaneous Tracheostomy with Bronchoscopic Guidance (10258)  Indication(s) Persistent respiratory failure  Consent Risks of the procedure as well as the alternatives and risks of each were explained to the patient and/or caregiver.  Consent for the procedure was obtained.  Anesthesia Etomidate, Versed, Fentanyl, Vecuronium   Time Out Verified patient identification, verified procedure, site/side was marked, verified correct patient position, special equipment/implants available, medications/allergies/relevant history reviewed, required imaging and test results available.   Sterile Technique Maximal sterile technique including sterile barrier drape, hand hygiene, sterile gown, sterile gloves, mask, hair covering.    Procedure Description Appropriate anatomy identified by palpation.  Patient's neck prepped and draped in sterile fashion.  1% lidocaine with epinephrine was used to anesthetize skin overlying neck.  1.5cm incision made and blunt dissection performed until tracheal rings could be easily palpated.   Then a size 8-0 Shiley tracheostomy was placed under bronchoscopic visualization using usual Seldinger technique and serial dilation.   Bronchoscope confirmed placement above the carina.  Tracheostomy was sutured in place with adhesive pad to protect skin under pressure.    Patient connected to ventilator.   Complications/Tolerance None; patient tolerated the procedure well. Chest X-ray is ordered to confirm no post-procedural complication.   EBL Minimal   Specimen(s) None

## 2020-09-14 NOTE — Progress Notes (Signed)
Patient ID: Donald Rowe, male   DOB: 1961-05-18, 59 y.o.   MRN: [id number]    Advanced Heart Failure Rounding Note   Subjective:    12/8 Cannulated for VV ECMO - 32 FR  RIJ [place] 12/9 Extubated/Reintubated for altered mental status 12/13 Circuit change for elevated LDH 12/15 PLEX started  MRSA growing in sputum.   Remains sedated on ECMO. Got 2u RBCs yesterday. Started on PLEX. Urine has cleared. Remains hypertensive. No BM.   Agitation still a major issue. Will arouse and have purposeful movements but not follow commands or coummincate  On bival. No bleeding  ECMO   Speed 4065 Flow 4.96L  Sweep 9 dP 24 pVen  -107  ABG: 7.36/43/61/90% Hgb 9.6 LDH 369 -> 447 -> 743 -> 1081 -> 1569 -> 1202 -> 1294 -> 1295 -> Pending Lactic acid 1.7 PTT 65  Vent 40% TVs 400s CXR: slightly improved diffuse bilateral infiltrates. Cannula position looks good today Personally reviewed     Objective:   Weight Range:  Vital Signs:   Temp:  [97.7 F (36.5 C)-99.5 F (37.5 C)] 98.06 F (36.7 C) (12/16 0700) Pulse Rate:  [85-102] 89 (12/16 0700) Resp:  [0-36] 10 (12/16 0700) BP: (135-186)/(48-65) 180/64 (12/16 0341) SpO2:  [81 %-99 %] 98 % (12/16 0700) Arterial Line BP: (135-201)/(48-74) 182/68 (12/16 0700) FiO2 (%):  [40 %-60 %] 40 % (12/16 0600) Weight:  [119.5 kg] 119.5 kg (12/16 0500) Last BM Date: 09/12/20  Weight change: Filed Weights   09/12/20 0407 09/13/20 0300 09/14/20 0500  Weight: 109.9 kg 116.5 kg 119.5 kg    Intake/Output:   Intake/Output Summary (Last 24 hours) at 09/14/2020 0742 Last data filed at 09/14/2020 0600 Gross per 24 hour  Intake 5926.03 ml  Output 3305 ml  Net 2621.03 ml     Physical Exam: General:  Intubated/sedated HEENT: normal Neck: supple. RIJ ECMO cannula. Carotids 2+ bilat; no bruits. No lymphadenopathy or thryomegaly appreciated. Cor: PMI nondisplaced. Regular rate & rhythm. No rubs, gallops or murmurs. Lungs: coarse Abdomen:  obese soft, nontender, nondistended. No hepatosplenomegaly. No bruits or masses. Good bowel sounds. Extremities: no cyanosis, clubbing, rash, edema Edema R hand mildly dusky Neuro: sedated on vent   Telemetry: sinus 80-90 Personally reviewed     Labs: Basic Metabolic Panel: Recent Labs  Lab 09/08/20 0343 09/08/20 0351 09/09/20 0458 09/09/20 0501 09/11/20 1615 09/11/20 1617 09/12/20 1551 09/12/20 1637 09/12/20 1825 09/12/20 2038 09/13/20 0304 09/13/20 0325 09/13/20 1206 09/13/20 1700 09/13/20 2016 09/13/20 2100 09/14/20 0401  NA 146*   < > 148*   < > 146*   < > 145   < > 145   < > 147*   < > 148* 145 146* 144 149*  K 4.4   < > 4.4   < > 3.9   < > 5.9*   < > 5.9*   < > 5.0   < > 4.7 4.8 5.0 4.8 5.0  CL 116*   < > 118*   < > 111   < > 111  --  111  --  111  --   --  112*  --  112*  --   CO2 23   < > 25   < > 25   < > 27  --  26  --  26  --   --  25  --  26  --   GLUCOSE 231*   < > 231*   < > 142*   < >  172*  --  193*  --  215*  --   --  220*  --  237*  --   BUN 40*   < > 40*   < > 51*   < > 61*  --  67*  --  75*  --   --  77*  --  80*  --   CREATININE 1.04   < > 0.94   < > 1.16   < > 1.36*  --  1.47*  --  1.61*  --   --  1.57*  --  1.56*  --   CALCIUM 7.5*   < > 7.9*   < > 7.7*   < > 8.0*  --  8.1*  --  8.4*  --   --  8.0*  --  8.1*  --   MG 2.8*  --  2.7*  --   --   --   --   --   --   --   --   --   --   --   --   --   --   PHOS 1.8*  --  2.1*  --  3.4  --   --   --   --   --   --   --   --   --   --   --   --    < > = values in this interval not displayed.    Liver Function Tests: Recent Labs  Lab 09/09/20 0458 09/10/20 0344 09/11/20 0503 09/12/20 0352 09/13/20 0304  AST 73* 70* 69* 52* 56*  ALT 60* 61* 44 36 36  ALKPHOS 79 102 128* 137* 153*  BILITOT 1.9* 2.0* 3.7* 2.2* 2.6*  PROT 4.5* 4.3* 4.2* 4.7* 5.1*  ALBUMIN 2.3* 2.0* 1.7* 1.9* 1.9*   No results for input(s): LIPASE, AMYLASE in the last 168 hours. No results for input(s): AMMONIA in the last 168  hours.  CBC: Recent Labs  Lab 09/12/20 0352 09/12/20 0403 09/12/20 1551 09/12/20 1637 09/13/20 0304 09/13/20 0325 09/13/20 1206 09/13/20 1700 09/13/20 2016 09/14/20 0352 09/14/20 0401  WBC 17.3*  --  19.5*  --  15.6*  --   --  17.5*  --  18.8*  --   HGB 9.5*   < > 9.2*   < > 8.5*   < > 8.8* 7.9* 7.5* 9.6* 9.2*  HCT 28.8*   < > 29.3*   < > 26.7*   < > 26.0* 25.0* 22.0* 28.2* 27.0*  MCV 94.1  --  96.1  --  95.4  --   --  95.8  --  94.6  --   PLT 130*  --  127*  --  131*  --   --  139*  --  135*  --    < > = values in this interval not displayed.    Cardiac Enzymes: No results for input(s): CKTOTAL, CKMB, CKMBINDEX, TROPONINI in the last 168 hours.  BNP: BNP (last 3 results) No results for input(s): BNP in the last 8760 hours.  ProBNP (last 3 results) No results for input(s): PROBNP in the last 8760 hours.    Other results:  Imaging: DG CHEST PORT 1 VIEW  Result Date: 09/13/2020 CLINICAL DATA:  COVID-19 positive. EXAM: PORTABLE CHEST 1 VIEW COMPARISON:  September 12, 2020. FINDINGS: Stable position of endotracheal and feeding tubes. Left internal jugular catheter is unchanged. ECMO is unchanged. Stable bilateral diffuse lung opacities are noted consistent with  pneumonia. No pneumothorax is noted. Bony thorax is unremarkable. IMPRESSION: Stable support apparatus. Stable bilateral diffuse lung opacities are noted consistent with pneumonia. Electronically Signed   By: Marijo Conception M.D.   On: 09/13/2020 08:11     Medications:     Scheduled Medications: . aspirin  81 mg Per Tube Daily  . chlorhexidine gluconate (MEDLINE KIT)  15 mL Mouth Rinse BID  . Chlorhexidine Gluconate Cloth  6 each Topical Daily  . clonazePAM  1 mg Per Tube BID  . docusate  100 mg Per Tube BID  . etomidate  40 mg Intravenous Once  . feeding supplement (PIVOT 1.5 CAL)  1,000 mL Per Tube Q24H  . feeding supplement (PROSource TF)  45 mL Per Tube QID  . fentaNYL (SUBLIMAZE) injection  200 mcg  Intravenous Once  . fiber  1 packet Per Tube BID  . free water  200 mL Per Tube Q2H  . insulin aspart  2-6 Units Subcutaneous Q4H  . insulin aspart  8 Units Subcutaneous Q4H  . insulin detemir  24 Units Subcutaneous Q12H  . levothyroxine  50 mcg Per Tube Q0600  . mouth rinse  15 mL Mouth Rinse 10 times per day  . methylPREDNISolone (SOLU-MEDROL) injection  60 mg Intravenous Q6H  . midazolam  5 mg Intravenous Once  . oxyCODONE  5 mg Per Tube Q6H  . pantoprazole sodium  40 mg Per Tube Daily  . pravastatin  40 mg Per Tube q1800  . QUEtiapine  100 mg Per Tube BID  . sodium chloride flush  10-40 mL Intracatheter Q12H  . valproic acid  500 mg Per Tube BID  . vecuronium  10 mg Intravenous Once    Infusions: . sodium chloride 10 mL/hr at 09/14/20 0500  . albumin human Stopped (09/11/20 1900)  . bivalirudin (ANGIOMAX) infusion 0.5 mg/mL (Non-ACS indications) 0.18 mg/kg/hr (09/14/20 0500)  . citrate dextrose Stopped (09/13/20 2315)  . dexmedetomidine (PRECEDEX) IV infusion 0.8 mcg/kg/hr (09/14/20 0500)  . dextrose    . fentaNYL infusion INTRAVENOUS 150 mcg/hr (09/14/20 0500)  . midazolam 2 mg/hr (09/14/20 0500)  . norepinephrine (LEVOPHED) Adult infusion Stopped (09/12/20 1530)  . propofol (DIPRIVAN) infusion 50 mcg/kg/min (09/14/20 0608)    PRN Medications: sodium chloride, acetaminophen (TYLENOL) oral liquid 160 mg/5 mL, albumin human, dextrose, diphenhydrAMINE, fentaNYL, heparin, labetalol, LORazepam, ondansetron **OR** ondansetron (ZOFRAN) IV, polyethylene glycol, sodium chloride flush   Assessment/Plan:   1. Acute hypoxic respiratory failure/ARDS in setting of COVID-19 PNA - Cannulated for VV ECMO on 12/8 after failing intubation x 2 days - has complete remedisivir, baricitinib, Solumedrol.  - continue abx (cefepime) - stop dates in place  - staph in tracheal aspirate -  vanc added 12/14 - circuit changed 12/13 for elevated LDH and lower flows. PLEX started 12/15.  LDH 1,290 ->  685, Continue PLEX qOD for now - CXR with severe diffuse infiltrates stable today Cannula position ok - PTT 65 Discussed dosing with PharmD personally. - Driving pressure ok - For trach today at bedside - Will narrow sedation to fentanyl/versed. Wean prcedex and propofol as tolerated.   2. Bilateral DVT - remains on bival - no bleeding  3. DM2 - on insulin gtt. CBGs high. Adjusting today  4. Obesity -Body mass index is 33.73 kg/m.  5. F/E/N - Na 147 -> 153 -> 146 - Continue free water  6. HTN - Continue metoprolol 50 mg bid.  - Add clonidine  7. Dusky R thumb - likely embolic. Stable  follow  8. AKI - creatinine improved today. Suspect due to hemolysis. Hold diuretics  9. Constipation - lactulose +/- sorbitol  Plan discussed on multi-discplinary ECMO rounds with CCM, ECMO coordinator/specialist, PharmDs and RNs.  CRITICAL CARE Performed by: Glori Bickers  CRITICAL CARE Performed by: Glori Bickers  Total critical care time: 40 minutes  Critical care time was exclusive of separately billable procedures and treating other patients.  Critical care was necessary to treat or prevent imminent or life-threatening deterioration.  Critical care was time spent personally by me (independent of midlevel providers or residents) on the following activities: development of treatment plan with patient and/or surrogate as well as nursing, discussions with consultants, evaluation of patient's response to treatment, examination of patient, obtaining history from patient or surrogate, ordering and performing treatments and interventions, ordering and review of laboratory studies, ordering and review of radiographic studies, pulse oximetry and re-evaluation of patient's condition.    Length of Stay: 12   Glori Bickers MD 09/14/2020, 7:42 AM  Advanced Heart Failure Team Pager [phone] (M-F; [place])  Please contact [facility] for night-coverage after hours (4p  -7a ) and weekends on [email or web address]

## 2020-09-14 NOTE — Progress Notes (Signed)
Dr Katrinka Blazing notified of continued hypertension. Systolic BP remaining in 180-200's. Orders received for 5mg  of Amlodipine and Celviprex gtt as needed.

## 2020-09-15 ENCOUNTER — Inpatient Hospital Stay (HOSPITAL_COMMUNITY): Payer: HRSA Program

## 2020-09-15 ENCOUNTER — Inpatient Hospital Stay: Payer: Self-pay

## 2020-09-15 DIAGNOSIS — J9601 Acute respiratory failure with hypoxia: Secondary | ICD-10-CM | POA: Diagnosis not present

## 2020-09-15 DIAGNOSIS — J80 Acute respiratory distress syndrome: Secondary | ICD-10-CM | POA: Diagnosis not present

## 2020-09-15 DIAGNOSIS — U071 COVID-19: Secondary | ICD-10-CM | POA: Diagnosis not present

## 2020-09-15 DIAGNOSIS — E871 Hypo-osmolality and hyponatremia: Secondary | ICD-10-CM | POA: Diagnosis not present

## 2020-09-15 LAB — CBC
HCT: 29.5 % — ABNORMAL LOW (ref 39.0–52.0)
HCT: 26.4 % — ABNORMAL LOW (ref 39.0–52.0)
HCT: 27.3 % — ABNORMAL LOW (ref 39.0–52.0)
Hemoglobin: 9.2 g/dL — ABNORMAL LOW (ref 13.0–17.0)
Hemoglobin: 8.6 g/dL — ABNORMAL LOW (ref 13.0–17.0)
Hemoglobin: 8.4 g/dL — ABNORMAL LOW (ref 13.0–17.0)
MCH: 30.8 pg (ref 26.0–34.0)
MCH: 31.6 pg (ref 26.0–34.0)
MCH: 30.1 pg (ref 26.0–34.0)
MCHC: 31.2 g/dL (ref 30.0–36.0)
MCHC: 32.6 g/dL (ref 30.0–36.0)
MCHC: 30.8 g/dL (ref 30.0–36.0)
MCV: 98.7 fL (ref 80.0–100.0)
MCV: 97.1 fL (ref 80.0–100.0)
MCV: 97.8 fL (ref 80.0–100.0)
Platelets: 150 10*3/uL (ref 150–400)
Platelets: 131 10*3/uL — ABNORMAL LOW (ref 150–400)
Platelets: 133 10*3/uL — ABNORMAL LOW (ref 150–400)
RBC: 2.99 MIL/uL — ABNORMAL LOW (ref 4.22–5.81)
RBC: 2.72 MIL/uL — ABNORMAL LOW (ref 4.22–5.81)
RBC: 2.79 MIL/uL — ABNORMAL LOW (ref 4.22–5.81)
RDW: 17 % — ABNORMAL HIGH (ref 11.5–15.5)
RDW: 17 % — ABNORMAL HIGH (ref 11.5–15.5)
RDW: 16.9 % — ABNORMAL HIGH (ref 11.5–15.5)
WBC: 21.7 10*3/uL — ABNORMAL HIGH (ref 4.0–10.5)
WBC: 16.8 10*3/uL — ABNORMAL HIGH (ref 4.0–10.5)
WBC: 17.5 10*3/uL — ABNORMAL HIGH (ref 4.0–10.5)
nRBC: 0.6 % — ABNORMAL HIGH (ref 0.0–0.2)
nRBC: 1 % — ABNORMAL HIGH (ref 0.0–0.2)
nRBC: 1 % — ABNORMAL HIGH (ref 0.0–0.2)

## 2020-09-15 LAB — BPAM RBC
Blood Product Expiration Date: 202201112359
Blood Product Expiration Date: 202201112359
Blood Product Expiration Date: 202201142359
Blood Product Expiration Date: 202201142359
Blood Product Expiration Date: 202201142359
Blood Product Expiration Date: 202201142359
ISSUE DATE / TIME: 202112120922
ISSUE DATE / TIME: 202112120922
ISSUE DATE / TIME: 202112130944
ISSUE DATE / TIME: 202112130944
ISSUE DATE / TIME: 202112152011
ISSUE DATE / TIME: 202112152011
Unit Type and Rh: 5100
Unit Type and Rh: 5100
Unit Type and Rh: 5100
Unit Type and Rh: 5100
Unit Type and Rh: 5100
Unit Type and Rh: 5100

## 2020-09-15 LAB — POCT I-STAT 7, (LYTES, BLD GAS, ICA,H+H)
Acid-Base Excess: 0 mmol/L (ref 0.0–2.0)
Acid-base deficit: 2 mmol/L (ref 0.0–2.0)
Acid-base deficit: 2 mmol/L (ref 0.0–2.0)
Acid-base deficit: 3 mmol/L — ABNORMAL HIGH (ref 0.0–2.0)
Acid-base deficit: 3 mmol/L — ABNORMAL HIGH (ref 0.0–2.0)
Acid-base deficit: 2 mmol/L (ref 0.0–2.0)
Acid-base deficit: 4 mmol/L — ABNORMAL HIGH (ref 0.0–2.0)
Bicarbonate: 23.7 mmol/L (ref 20.0–28.0)
Bicarbonate: 23.8 mmol/L (ref 20.0–28.0)
Bicarbonate: 24.5 mmol/L (ref 20.0–28.0)
Bicarbonate: 23.6 mmol/L (ref 20.0–28.0)
Bicarbonate: 23.6 mmol/L (ref 20.0–28.0)
Bicarbonate: 25.1 mmol/L (ref 20.0–28.0)
Bicarbonate: 21.9 mmol/L (ref 20.0–28.0)
Calcium, Ion: 1.26 mmol/L (ref 1.15–1.40)
Calcium, Ion: 1.27 mmol/L (ref 1.15–1.40)
Calcium, Ion: 1.28 mmol/L (ref 1.15–1.40)
Calcium, Ion: 1.25 mmol/L (ref 1.15–1.40)
Calcium, Ion: 1.28 mmol/L (ref 1.15–1.40)
Calcium, Ion: 1.24 mmol/L (ref 1.15–1.40)
Calcium, Ion: 1.24 mmol/L (ref 1.15–1.40)
HCT: 26 % — ABNORMAL LOW (ref 39.0–52.0)
HCT: 28 % — ABNORMAL LOW (ref 39.0–52.0)
HCT: 30 % — ABNORMAL LOW (ref 39.0–52.0)
HCT: 26 % — ABNORMAL LOW (ref 39.0–52.0)
HCT: 26 % — ABNORMAL LOW (ref 39.0–52.0)
HCT: 23 % — ABNORMAL LOW (ref 39.0–52.0)
HCT: 24 % — ABNORMAL LOW (ref 39.0–52.0)
Hemoglobin: 10.2 g/dL — ABNORMAL LOW (ref 13.0–17.0)
Hemoglobin: 8.8 g/dL — ABNORMAL LOW (ref 13.0–17.0)
Hemoglobin: 9.5 g/dL — ABNORMAL LOW (ref 13.0–17.0)
Hemoglobin: 8.8 g/dL — ABNORMAL LOW (ref 13.0–17.0)
Hemoglobin: 8.8 g/dL — ABNORMAL LOW (ref 13.0–17.0)
Hemoglobin: 7.8 g/dL — ABNORMAL LOW (ref 13.0–17.0)
Hemoglobin: 8.2 g/dL — ABNORMAL LOW (ref 13.0–17.0)
O2 Saturation: 82 %
O2 Saturation: 83 %
O2 Saturation: 86 %
O2 Saturation: 81 %
O2 Saturation: 84 %
O2 Saturation: 85 %
O2 Saturation: 87 %
Patient temperature: 37
Patient temperature: 37
Patient temperature: 37.1
Patient temperature: 36.9
Patient temperature: 36.6
Patient temperature: 36.4
Patient temperature: 36.8
Potassium: 4.7 mmol/L (ref 3.5–5.1)
Potassium: 4.9 mmol/L (ref 3.5–5.1)
Potassium: 5 mmol/L (ref 3.5–5.1)
Potassium: 4.7 mmol/L (ref 3.5–5.1)
Potassium: 4.7 mmol/L (ref 3.5–5.1)
Potassium: 4.7 mmol/L (ref 3.5–5.1)
Potassium: 4.7 mmol/L (ref 3.5–5.1)
Sodium: 149 mmol/L — ABNORMAL HIGH (ref 135–145)
Sodium: 149 mmol/L — ABNORMAL HIGH (ref 135–145)
Sodium: 150 mmol/L — ABNORMAL HIGH (ref 135–145)
Sodium: 149 mmol/L — ABNORMAL HIGH (ref 135–145)
Sodium: 149 mmol/L — ABNORMAL HIGH (ref 135–145)
Sodium: 150 mmol/L — ABNORMAL HIGH (ref 135–145)
Sodium: 152 mmol/L — ABNORMAL HIGH (ref 135–145)
TCO2: 25 mmol/L (ref 22–32)
TCO2: 25 mmol/L (ref 22–32)
TCO2: 26 mmol/L (ref 22–32)
TCO2: 25 mmol/L (ref 22–32)
TCO2: 25 mmol/L (ref 22–32)
TCO2: 26 mmol/L (ref 22–32)
TCO2: 23 mmol/L (ref 22–32)
pCO2 arterial: 46.1 mmHg (ref 32.0–48.0)
pCO2 arterial: 47.1 mmHg (ref 32.0–48.0)
pCO2 arterial: 48.4 mmHg — ABNORMAL HIGH (ref 32.0–48.0)
pCO2 arterial: 46.7 mmHg (ref 32.0–48.0)
pCO2 arterial: 44.1 mmHg (ref 32.0–48.0)
pCO2 arterial: 42.6 mmHg (ref 32.0–48.0)
pCO2 arterial: 44.2 mmHg (ref 32.0–48.0)
pH, Arterial: 7.3 — ABNORMAL LOW (ref 7.350–7.450)
pH, Arterial: 7.319 — ABNORMAL LOW (ref 7.350–7.450)
pH, Arterial: 7.325 — ABNORMAL LOW (ref 7.350–7.450)
pH, Arterial: 7.31 — ABNORMAL LOW (ref 7.350–7.450)
pH, Arterial: 7.335 — ABNORMAL LOW (ref 7.350–7.450)
pH, Arterial: 7.377 (ref 7.350–7.450)
pH, Arterial: 7.301 — ABNORMAL LOW (ref 7.350–7.450)
pO2, Arterial: 52 mmHg — ABNORMAL LOW (ref 83.0–108.0)
pO2, Arterial: 52 mmHg — ABNORMAL LOW (ref 83.0–108.0)
pO2, Arterial: 56 mmHg — ABNORMAL LOW (ref 83.0–108.0)
pO2, Arterial: 50 mmHg — ABNORMAL LOW (ref 83.0–108.0)
pO2, Arterial: 51 mmHg — ABNORMAL LOW (ref 83.0–108.0)
pO2, Arterial: 49 mmHg — ABNORMAL LOW (ref 83.0–108.0)
pO2, Arterial: 57 mmHg — ABNORMAL LOW (ref 83.0–108.0)

## 2020-09-15 LAB — TYPE AND SCREEN
ABO/RH(D): O POS
Antibody Screen: NEGATIVE
Unit division: 0
Unit division: 0
Unit division: 0
Unit division: 0
Unit division: 0
Unit division: 0

## 2020-09-15 LAB — BASIC METABOLIC PANEL
Anion gap: 9 (ref 5–15)
Anion gap: 6 (ref 5–15)
BUN: 82 mg/dL — ABNORMAL HIGH (ref 6–20)
BUN: 79 mg/dL — ABNORMAL HIGH (ref 6–20)
CO2: 23 mmol/L (ref 22–32)
CO2: 23 mmol/L (ref 22–32)
Calcium: 8 mg/dL — ABNORMAL LOW (ref 8.9–10.3)
Calcium: 7.5 mg/dL — ABNORMAL LOW (ref 8.9–10.3)
Chloride: 117 mmol/L — ABNORMAL HIGH (ref 98–111)
Chloride: 120 mmol/L — ABNORMAL HIGH (ref 98–111)
Creatinine, Ser: 1.55 mg/dL — ABNORMAL HIGH (ref 0.61–1.24)
Creatinine, Ser: 1.41 mg/dL — ABNORMAL HIGH (ref 0.61–1.24)
GFR, Estimated: 51 mL/min — ABNORMAL LOW (ref >60)
GFR, Estimated: 57 mL/min — ABNORMAL LOW (ref >60)
Glucose, Bld: 215 mg/dL — ABNORMAL HIGH (ref 70–99)
Glucose, Bld: 175 mg/dL — ABNORMAL HIGH (ref 70–99)
Potassium: 4.8 mmol/L (ref 3.5–5.1)
Potassium: 4.7 mmol/L (ref 3.5–5.1)
Sodium: 149 mmol/L — ABNORMAL HIGH (ref 135–145)
Sodium: 149 mmol/L — ABNORMAL HIGH (ref 135–145)

## 2020-09-15 LAB — HEPATIC FUNCTION PANEL
ALT: 37 U/L (ref 0–44)
AST: 44 U/L — ABNORMAL HIGH (ref 15–41)
Albumin: 2.7 g/dL — ABNORMAL LOW (ref 3.5–5.0)
Alkaline Phosphatase: 137 U/L — ABNORMAL HIGH (ref 38–126)
Bilirubin, Direct: 1.5 mg/dL — ABNORMAL HIGH (ref 0.0–0.2)
Indirect Bilirubin: 1 mg/dL — ABNORMAL HIGH (ref 0.3–0.9)
Total Bilirubin: 2.5 mg/dL — ABNORMAL HIGH (ref 0.3–1.2)
Total Protein: 4.5 g/dL — ABNORMAL LOW (ref 6.5–8.1)

## 2020-09-15 LAB — APTT
aPTT: 55 seconds — ABNORMAL HIGH (ref 24–36)
aPTT: 61 seconds — ABNORMAL HIGH (ref 24–36)
aPTT: 83 seconds — ABNORMAL HIGH (ref 24–36)
aPTT: 70 seconds — ABNORMAL HIGH (ref 24–36)

## 2020-09-15 LAB — GLUCOSE, CAPILLARY
Glucose-Capillary: 198 mg/dL — ABNORMAL HIGH (ref 70–99)
Glucose-Capillary: 200 mg/dL — ABNORMAL HIGH (ref 70–99)
Glucose-Capillary: 190 mg/dL — ABNORMAL HIGH (ref 70–99)
Glucose-Capillary: 188 mg/dL — ABNORMAL HIGH (ref 70–99)
Glucose-Capillary: 168 mg/dL — ABNORMAL HIGH (ref 70–99)
Glucose-Capillary: 176 mg/dL — ABNORMAL HIGH (ref 70–99)

## 2020-09-15 LAB — PROTIME-INR
INR: 2.4 — ABNORMAL HIGH (ref 0.8–1.2)
Prothrombin Time: 25.7 seconds — ABNORMAL HIGH (ref 11.4–15.2)

## 2020-09-15 LAB — LACTIC ACID, PLASMA
Lactic Acid, Venous: 2.7 mmol/L (ref 0.5–1.9)
Lactic Acid, Venous: 1.4 mmol/L (ref 0.5–1.9)

## 2020-09-15 LAB — LACTATE DEHYDROGENASE: LDH: 916 U/L — ABNORMAL HIGH (ref 98–192)

## 2020-09-15 LAB — FIBRINOGEN: Fibrinogen: 246 mg/dL (ref 210–475)

## 2020-09-15 MED ORDER — DIPHENHYDRAMINE HCL 25 MG PO CAPS: 25.0000 mg | ORAL_CAPSULE | Freq: Four times a day (QID) | ORAL | Status: DC | PRN

## 2020-09-15 MED ORDER — HEPARIN SODIUM (PORCINE) 1000 UNIT/ML IJ SOLN
INTRAMUSCULAR | Status: AC
  Administered 2020-09-15: 14:00:00 2800 [IU]
  Filled 2020-09-15: qty 4

## 2020-09-15 MED ORDER — AMLODIPINE BESYLATE 10 MG PO TABS
10.0000 mg | ORAL_TABLET | Freq: Every day | ORAL | Status: DC
  Administered 2020-09-15 – 2020-09-20 (×4): 10 mg
  Filled 2020-09-15 (×4): qty 1

## 2020-09-15 MED ORDER — STERILE WATER FOR INJECTION IJ SOLN
INTRAMUSCULAR | Status: AC
  Administered 2020-09-15: 09:00:00 10 mL
  Filled 2020-09-15: qty 10

## 2020-09-15 MED ORDER — LIDOCAINE-EPINEPHRINE 1 %-1:100000 IJ SOLN
10.0000 mL | Freq: Once | INTRAMUSCULAR | Status: AC
  Administered 2020-09-15: 22:00:00 1 mL via INTRADERMAL
  Filled 2020-09-15: qty 1

## 2020-09-15 MED ORDER — ACETAMINOPHEN 325 MG PO TABS: 650.0000 mg | ORAL_TABLET | ORAL | Status: DC | PRN

## 2020-09-15 MED ORDER — FUROSEMIDE 10 MG/ML IJ SOLN
20.0000 mg | Freq: Once | INTRAMUSCULAR | Status: AC
  Administered 2020-09-15: 09:00:00 20 mg via INTRAVENOUS
  Filled 2020-09-15: qty 2

## 2020-09-15 MED ORDER — METOPROLOL TARTRATE 12.5 MG HALF TABLET
25.0000 mg | ORAL_TABLET | Freq: Three times a day (TID) | ORAL | Status: DC
  Administered 2020-09-15 (×3): 25 mg
  Filled 2020-09-15 (×3): qty 2

## 2020-09-15 MED ORDER — INSULIN ASPART 100 UNIT/ML ~~LOC~~ SOLN
10.0000 [IU] | SUBCUTANEOUS | Status: DC
  Administered 2020-09-15 – 2020-09-21 (×31): 10 [IU] via SUBCUTANEOUS

## 2020-09-15 MED ORDER — CALCIUM CARBONATE ANTACID 500 MG PO CHEW: 2.0000 | CHEWABLE_TABLET | ORAL | Status: AC

## 2020-09-15 MED ORDER — OXYCODONE HCL 5 MG/5ML PO SOLN
15.0000 mg | Freq: Four times a day (QID) | ORAL | Status: DC
  Administered 2020-09-15 – 2020-09-22 (×28): 15 mg
  Administered 2020-09-22: 15:00:00 10 mg
  Administered 2020-09-22: 10:00:00 15 mg
  Filled 2020-09-15 (×30): qty 15

## 2020-09-15 MED ORDER — "THROMBI-PAD 3""X3"" EX PADS"
1.0000 | MEDICATED_PAD | Freq: Once | CUTANEOUS | Status: AC
  Administered 2020-09-16: 05:00:00 1 via TOPICAL
  Filled 2020-09-15: qty 1

## 2020-09-15 MED ORDER — CLONAZEPAM 1 MG PO TABS
2.0000 mg | ORAL_TABLET | Freq: Two times a day (BID) | ORAL | Status: DC
  Administered 2020-09-15 – 2020-09-25 (×22): 2 mg
  Filled 2020-09-15 (×23): qty 2

## 2020-09-15 MED ORDER — PIVOT 1.5 CAL PO LIQD
1000.0000 mL | ORAL | Status: DC
  Administered 2020-09-16 – 2020-09-29 (×18): 1000 mL
  Filled 2020-09-15 (×2): qty 1000

## 2020-09-15 MED ORDER — HEPARIN SODIUM (PORCINE) 1000 UNIT/ML IJ SOLN: 1000.0000 [IU] | Freq: Once | INTRAMUSCULAR | Status: AC

## 2020-09-15 MED ORDER — ACD FORMULA A 0.73-2.45-2.2 GM/100ML VI SOLN
1000.0000 mL | Status: DC
  Filled 2020-09-15 (×2): qty 1000

## 2020-09-15 MED ORDER — ACD FORMULA A 0.73-2.45-2.2 GM/100ML VI SOLN
Status: AC
  Administered 2020-09-15: 12:00:00 1000 mL
  Filled 2020-09-15: qty 500

## 2020-09-15 MED ORDER — SODIUM CHLORIDE 0.9 % IV SOLN
INTRAVENOUS | Status: AC
  Filled 2020-09-15 (×4): qty 200

## 2020-09-15 MED ORDER — SODIUM CHLORIDE 0.9% FLUSH: 10.0000 mL | INTRAVENOUS | Status: DC | PRN

## 2020-09-15 MED ORDER — PROSOURCE TF PO LIQD
45.0000 mL | Freq: Three times a day (TID) | ORAL | Status: DC
  Administered 2020-09-15 – 2020-09-26 (×33): 45 mL
  Filled 2020-09-15 (×33): qty 45

## 2020-09-15 MED ORDER — SODIUM CHLORIDE 0.9 % IV SOLN
2.0000 g | Freq: Three times a day (TID) | INTRAVENOUS | Status: AC
  Administered 2020-09-15 – 2020-09-19 (×13): 2 g via INTRAVENOUS
  Filled 2020-09-15 (×15): qty 2

## 2020-09-15 MED ORDER — SODIUM CHLORIDE 0.9 % IV SOLN
0.1870 mg/kg/h | INTRAVENOUS | Status: DC
  Administered 2020-09-15 (×2): 0.187 mg/kg/h via INTRAVENOUS
  Filled 2020-09-15 (×3): qty 250

## 2020-09-15 MED ORDER — CALCIUM GLUCONATE-NACL 2-0.675 GM/100ML-% IV SOLN
2.0000 g | Freq: Once | INTRAVENOUS | Status: AC
  Filled 2020-09-15: qty 100

## 2020-09-15 MED ORDER — CALCIUM GLUCONATE-NACL 2-0.675 GM/100ML-% IV SOLN
INTRAVENOUS | Status: AC
  Administered 2020-09-15: 13:00:00 2000 mg via INTRAVENOUS
  Filled 2020-09-15: qty 100

## 2020-09-15 MED ORDER — SODIUM CHLORIDE 0.9 % IV SOLN
2.0000 g | Freq: Three times a day (TID) | INTRAVENOUS | Status: DC
  Administered 2020-09-15: 08:00:00 2 g via INTRAVENOUS
  Filled 2020-09-15 (×2): qty 2

## 2020-09-15 MED ORDER — SODIUM CHLORIDE 0.9% FLUSH
10.0000 mL | Freq: Two times a day (BID) | INTRAVENOUS | Status: DC
  Administered 2020-09-16 – 2020-09-23 (×11): 10 mL
  Administered 2020-09-23: 22:00:00 20 mL

## 2020-09-15 MED ORDER — "THROMBI-PAD 3""X3"" EX PADS"
1.0000 | MEDICATED_PAD | Freq: Once | CUTANEOUS | Status: AC
  Administered 2020-09-15: 17:00:00 1 via TOPICAL
  Filled 2020-09-15: qty 1

## 2020-09-15 MED ORDER — VECURONIUM BROMIDE 10 MG IV SOLR
INTRAVENOUS | Status: AC
  Administered 2020-09-15: 09:00:00 10 mg
  Filled 2020-09-15: qty 10

## 2020-09-15 MED ORDER — DIPHENHYDRAMINE HCL 12.5 MG/5ML PO ELIX
25.0000 mg | ORAL_SOLUTION | Freq: Four times a day (QID) | ORAL | Status: DC | PRN
  Administered 2020-10-05: 25 mg
  Filled 2020-09-15: qty 10

## 2020-09-15 NOTE — Procedures (Signed)
Bronchoscopy Procedure Note  Donald Rowe  [id number]  April 18, 1961  Date:09/15/20  Time:9:06 AM   Provider Performing:Mashanda Ishibashi   Procedure(s):  Flexible bronchoscopy with bronchial alveolar lavage (19758)  Indication(s) Hemorrhage.   Consent Risks of the procedure as well as the alternatives and risks of each were explained to the patient and/or caregiver.  Consent for the procedure was obtained and is signed in the bedside chart  Anesthesia On sedation infusions. Vecuronium 75m   Time Out Verified patient identification, verified procedure, site/side was marked, verified correct patient position, special equipment/implants available, medications/allergies/relevant history reviewed, required imaging and test results available.   Sterile Technique Usual hand hygiene, masks, gowns, and gloves were used   Procedure Description Bronchoscope advanced through tracheostomy tube and into airway.  Airways were examined down to subsegmental level with findings noted below.   Following diagnostic evaluation, BAL(s) performed in 30 with normal saline and return of 20 fluid  Findings: bleeding with clot principally from LUL   Complications/Tolerance None; patient tolerated the procedure well. Chest X-ray is not needed post procedure.   EBL Minimal   Specimen(s) Send for culture.  RKipp Brood MD [facility] Physician [facility] Pager: [phone] Epic Secure Chat After hours: [phone].  09/15/2020, 9:09 AM

## 2020-09-15 NOTE — Plan of Care (Signed)
  Problem: Respiratory: Goal: Ability to maintain adequate ventilation will improve Outcome: Progressing   Problem: Clinical Measurements: Goal: Ability to maintain clinical measurements within normal limits will improve Outcome: Progressing Goal: Cardiovascular complication will be avoided Outcome: Progressing   Problem: Nutrition: Goal: Adequate nutrition will be maintained Outcome: Progressing   Problem: Coping: Goal: Level of anxiety will decrease Outcome: Progressing   Problem: Respiratory: Goal: Complications related to the disease process, condition or treatment will be avoided or minimized Outcome: Not Progressing

## 2020-09-15 NOTE — Progress Notes (Signed)
RT assisted Dr. Denese Killings with bedside bronchoscopy. Two samples were collected during procedure. Pt's SpO2 decreased to 82% during procedure. Pt's FiO2 was increased to 100%. Pt's SpO2 maintaining now at 83%. RT will continue to monitor pt.

## 2020-09-15 NOTE — Progress Notes (Signed)
12cc lidocaine 1% with epi 1% injected around trach stoma due to ongoing bleeding refractory to thrombi pad.  Steffanie Dunn, DO 09/15/20 9:40 PM [facility]

## 2020-09-15 NOTE — Progress Notes (Signed)
ANTICOAGULATION CONSULT NOTE  Pharmacy Consult for bivalirudin Indication: ECMO + bilateral DVTs   Recent Labs    09/13/20 0304 09/13/20 0325 09/14/20 0352 09/14/20 0401 09/14/20 1645 09/14/20 1649 09/15/20 0312 09/15/20 0326 09/15/20 1132 09/15/20 1133 09/15/20 1616 09/15/20 1618  HGB 8.5*   < > 9.6*   < > 9.9*   < > 9.2*   < >  --  7.8* 8.2* 8.6*  HCT 26.7*   < > 28.2*   < > 29.7*   < > 29.5*   < >  --  23.0* 24.0* 26.4*  PLT 131*   < > 135*  --  153  --  150  --   --   --   --  131*  APTT 78*   < > 65*  --  47*   < > 55*  --  61*  --   --  83*  LABPROT 26.4*  --  30.1*  --   --   --  25.7*  --   --   --   --   --   INR 2.5*  --  3.0*  --   --   --  2.4*  --   --   --   --   --   CREATININE 1.61*   < > 1.46*  --  1.33*  --  1.55*  --   --   --   --  1.41*   < > = values in this interval not displayed.    Estimated Creatinine Clearance: 72 mL/min (A) (by C-G formula based on SCr of 1.41 mg/dL (H)).   Assessment: 67 yom unvaccinated presenting with severe COVID ARDs, intubated on 12/6, continued to have ventilation issues despite NMB and proning - started on VV ECMO on 12/8.  D-dimer >20 > now improved 14,  found to have bilateral DVTs.   aptt now 83 - drug running in HD cath in groin, blood drawn from art line  Still bleeding from trach  Goal of Therapy:  aPTT 60-70 seconds Monitor platelets by anticoagulation protocol: Yes   Plan:  Will decrease bival to 0.187 mg/kg/hr Recheck at 2130 Discussed with nursing ecmo specialist   Elmer Sow, PharmD, BCPS, BCCCP Clinical Pharmacist [phone]  Please check AMION for all [facility] Pharmacy numbers  09/15/2020 5:21 PM

## 2020-09-15 NOTE — Progress Notes (Addendum)
ANTICOAGULATION CONSULT NOTE  Pharmacy Consult for bivalirudin Indication: ECMO + bilateral DVTs   Recent Labs    09/13/20 0304 09/13/20 0325 09/14/20 0352 09/14/20 0401 09/14/20 1645 09/14/20 1649 09/14/20 2059 09/14/20 2104 09/14/20 2211 09/14/20 2217 09/15/20 0312 09/15/20 0326 09/15/20 0623  HGB 8.5*   < > 9.6*   < > 9.9*   < >  --    < >  --    < > 9.2* 8.8* 8.8*  HCT 26.7*   < > 28.2*   < > 29.7*   < >  --    < >  --    < > 29.5* 26.0* 26.0*  PLT 131*   < > 135*  --  153  --   --   --   --   --  150  --   --   APTT 78*   < > 65*  --  47*  --  104*  --  53*  --  55*  --   --   LABPROT 26.4*  --  30.1*  --   --   --   --   --   --   --  25.7*  --   --   INR 2.5*  --  3.0*  --   --   --   --   --   --   --  2.4*  --   --   CREATININE 1.61*   < > 1.46*  --  1.33*  --   --   --   --   --  1.55*  --   --    < > = values in this interval not displayed.    Estimated Creatinine Clearance: 65.5 mL/min (A) (by C-G formula based on SCr of 1.55 mg/dL (H)).   Assessment: 37 yom unvaccinated presenting with severe COVID ARDs, intubated on 12/6, continued to have ventilation issues despite NMB and proning - started on VV ECMO on 12/8.  D-dimer >20 > now improved 14,  found to have bilateral DVTs.   APTT came back at 55, on bivalirudin@0 .22 mg/kg/hr. Hgb 8.8, plt 150. Fibrinogen 246. LDH increasing to 916 tonight. Bleeding in mouth stable, still suctioning bright red blood in ET tube - MD and team aware to monitor closely.  Concern with IV site where bivalirudin infusing - now running peripheral (positional) due to IV compatibility.   Goal of Therapy:  aPTT 60-70 seconds Monitor platelets by anticoagulation protocol: Yes   Plan:  Will continue bivalirudin slightly (by 10%) to 0.2 mg/kg/h (dosing wt 103kg) - change IV site Check aPTT in 2 hours after  Monitor CBC, s/sx bleeding  Sherron Monday, PharmD, BCCCP Clinical Pharmacist  Phone: [phone] 09/15/2020 7:28 AM  Please  check AMION for all [facility] Pharmacy phone numbers After 10:00 PM, call Main Pharmacy [phone]  ADDENDUM APTT came back therapeutic with change in IV infusion site at 61, on 0.22 mg/kg/hr. No change in bleeding. Will continue at same rate and get level tonight at 1700.   Sherron Monday, PharmD, BCCCP Clinical Pharmacist  Phone: [phone] 09/15/2020 1:30 PM  Please check AMION for all [facility] Pharmacy phone numbers After 10:00 PM, call Main Pharmacy [phone]

## 2020-09-15 NOTE — Progress Notes (Addendum)
Physical Therapy Treatment Patient Details Name: Lionel Woodberry MRN: [id number] DOB: 11/11/1960 Today's Date: 09/15/2020    History of Present Illness 59 y.o. male with a pertinent history of hypothyroidism on levothyroxine and hyperlipidemia who was diagnosed with Covid 11/27 and is not vaccinated.  With EMS SaO2 on RA 78%O2. ED patient was increased to 6 L oxygen satting about 92%,. CTA of the Chest was assessed and negative for pulmonary embolism but showed diffuse bilateral infiltrates. He was treated with IV remdesivir, steroids and baricitinib.  Initially, he was also treated with antibiotics but these were stopped on 12/6 with a low procalcitonin. 12/07 intubated, central line, A line, 12/08 ECMO cannulation, cortrak, attempted extubation 12/9 failed reintubated. Trach placed 12/16.      PT Comments    Pt admitted with above diagnosis. Pt was able to tolerate tilt for 25 minutes at 45 degrees max tilt. Pt sedated and no verbal response.  Vital sign response positive with O2 sats 93% on arrival and 99% with tilt.  Added a tilt bed goal today.  Will continue to progress pt as able.   Pt currently with functional limitations due to weakness, sedation and endurance deficits. Pt will benefit from skilled PT to increase their independence and safety with mobility to allow discharge to the venue listed below.      09/15/20 1406  Tilt Bed  Start Time 1036  Angle 45 degrees  Total Minutes in Angle 25 minutes  BP 119/67  Pulse Rate 95  ECG Heart Rate 96  Resp (!) 21  SpO2 99 %  O2 Device Ventilator  FiO2 (%) 100 %  Patient Response Flat affect   Vent settings 100% FIO2 with PEEP 10.    Follow Up Recommendations  CIR     Equipment Recommendations  Other (comment) (TBD)    Recommendations for Other Services       Precautions / Restrictions Precautions Precaution Comments: COVID+, ECMO, cortrak, intubated,  condom cath Restrictions Weight Bearing Restrictions: No    Mobility   Bed Mobility               General bed mobility comments: Continued: Total A +2 for reposition in bed. Use of kreg tilt bed for semi standing. +2 to manage line and safety.  Transfers                 General transfer comment: Continued to utilized tilt bed to come to semi-standing position  Ambulation/Gait                 Stairs             Wheelchair Mobility    Modified Rankin (Stroke Patients Only)       Balance                                            Cognition Arousal/Alertness: Suspect due to medications Behavior During Therapy: Flat affect Overall Cognitive Status: Difficult to assess                                 General Comments: RN in hopes that with movement with therapy pt will begin to rouse, however despite best efforts and various stimuli      Exercises General Exercises - Lower Extremity Ankle Circles/Pumps: PROM;Both;5 reps;Supine Hip Flexion/Marching:  PROM;Both;5 reps;Supine    General Comments General comments (skin integrity, edema, etc.): RN and ECMO specialist present throughout session, SaO2 96-99%O2, HR in high 90s BP stable via ART line      Pertinent Vitals/Pain Faces Pain Scale: No hurt Pain Location: no change in response to nail bed pressure or in elevated position Pain Intervention(s): Monitored during session    Home Living                      Prior Function            PT Goals (current goals can now be found in the care plan section) Acute Rehab PT Goals Patient Stated Goal: Patient tolerating increasing angle in stand table.  Up to 45 degrees. Progress towards PT goals: Not progressing toward goals - comment (Sedated but does tolerate tilts)    Frequency    Min 3X/week      PT Plan Current plan remains appropriate    Co-evaluation PT/OT/SLP Co-Evaluation/Treatment: Yes Reason for Co-Treatment: Complexity of the patient's impairments  (multi-system involvement);For patient/therapist safety PT goals addressed during session: Mobility/safety with mobility OT goals addressed during session: Other (comment) (stand tolerance)      AM-PAC PT "6 Clicks" Mobility   Outcome Measure  Help needed turning from your back to your side while in a flat bed without using bedrails?: Total Help needed moving from lying on your back to sitting on the side of a flat bed without using bedrails?: Total Help needed moving to and from a bed to a chair (including a wheelchair)?: Total Help needed standing up from a chair using your arms (e.g., wheelchair or bedside chair)?: Total Help needed to walk in hospital room?: Total Help needed climbing 3-5 steps with a railing? : Total 6 Click Score: 6    End of Session Equipment Utilized During Treatment: Other (comment) (Kreg bed) Activity Tolerance: Patient tolerated treatment well Patient left: in bed;with nursing/sitter in room;with call bell/phone within reach Nurse Communication: Other (comment) (tilt schedule goal 3x/day) PT Visit Diagnosis: Muscle weakness (generalized) (M62.81);Difficulty in walking, not elsewhere classified (R26.2)     Time: 1015-1100 PT Time Calculation (min) (ACUTE ONLY): 45 min  Charges:  $Therapeutic Exercise: 8-22 mins $Therapeutic Activity: 8-22 mins                     Chryl Holten W,PT Acute Rehabilitation Services Pager:  [phone]  Office:  [phone]     Berline Lopes 09/15/2020, 2:12 PM

## 2020-09-15 NOTE — Progress Notes (Signed)
Secure chat with Allena Katz MD re: PICC order, okay to place PICC per nephrology.

## 2020-09-15 NOTE — Progress Notes (Signed)
ANTICOAGULATION CONSULT NOTE  Pharmacy Consult for bivalirudin Indication: ECMO + bilateral DVTs   Recent Labs    09/13/20 0304 09/13/20 0325 09/14/20 0352 09/14/20 0401 09/14/20 1645 09/14/20 1649 09/15/20 0312 09/15/20 0326 09/15/20 1132 09/15/20 1133 09/15/20 1616 09/15/20 1618 09/15/20 2053  HGB 8.5*   < > 9.6*   < > 9.9*   < > 9.2*   < >  --    < > 8.2* 8.6* 8.4*  HCT 26.7*   < > 28.2*   < > 29.7*   < > 29.5*   < >  --    < > 24.0* 26.4* 27.3*  PLT 131*   < > 135*  --  153  --  150  --   --   --   --  131* 133*  APTT 78*   < > 65*  --  47*   < > 55*  --  61*  --   --  83* 70*  LABPROT 26.4*  --  30.1*  --   --   --  25.7*  --   --   --   --   --   --   INR 2.5*  --  3.0*  --   --   --  2.4*  --   --   --   --   --   --   CREATININE 1.61*   < > 1.46*  --  1.33*  --  1.55*  --   --   --   --  1.41*  --    < > = values in this interval not displayed.    Estimated Creatinine Clearance: 72 mL/min (A) (by C-G formula based on SCr of 1.41 mg/dL (H)).   Assessment: 88 yom unvaccinated presenting with severe COVID ARDs, intubated on 12/6, continued to have ventilation issues despite NMB and proning - started on VV ECMO on 12/8.  D-dimer >20 > now improved 14,  found to have bilateral DVTs.   aptt now 70 - within goal  Goal of Therapy:  aPTT 60-70 seconds Monitor platelets by anticoagulation protocol: Yes   Plan:  Continue bival to 0.187 mg/kg/hr Recheck in am  Elmer Sow, PharmD, BCPS, BCCCP Clinical Pharmacist [phone]  Please check AMION for all [facility] Pharmacy numbers  09/15/2020 9:55 PM

## 2020-09-15 NOTE — Progress Notes (Signed)
Peripherally Inserted Central Catheter Placement  The IV Nurse has discussed with the patient and/or persons authorized to consent for the patient, the purpose of this procedure and the potential benefits and risks involved with this procedure.  The benefits include less needle sticks, lab draws from the catheter, and the patient may be discharged home with the catheter. Risks include, but not limited to, infection, bleeding, blood clot (thrombus formation), and puncture of an artery; nerve damage and irregular heartbeat and possibility to perform a PICC exchange if needed/ordered by physician.  Alternatives to this procedure were also discussed.  Bard Power PICC patient education guide, fact sheet on infection prevention and patient information card has been provided to patient /or left at bedside.    PICC Placement Documentation  PICC Triple Lumen 09/15/20 PICC Right Basilic 46 cm (Active)  Indication for Insertion or Continuance of Line Vasoactive infusions 09/15/20 1644  Exposed Catheter (cm) 0 cm 09/15/20 1644  Site Assessment Clean;Dry;Intact 09/15/20 1644  Lumen #1 Status Flushed;Saline locked;Blood return noted 09/15/20 1644  Lumen #2 Status Flushed;Saline locked;Blood return noted 09/15/20 1644  Lumen #3 Status Flushed;Saline locked;Blood return noted 09/15/20 1644  Dressing Type Transparent;Securing device 09/15/20 1644  Dressing Status Clean;Dry;Intact 09/15/20 1644  Antimicrobial disc in place? Yes 09/15/20 1644  Safety Lock Not Applicable 09/15/20 1644  Dressing Intervention New dressing 09/15/20 1644  Dressing Change Due 09/22/20 09/15/20 1644       Annett Fabian 09/15/2020, 4:45 PM

## 2020-09-15 NOTE — Progress Notes (Signed)
Occupational Therapy Treatment Patient Details Name: Donald Rowe MRN: [id number] DOB: [birth year] Today's Date: 09/15/2020    History of present illness 59 y.o. male with a pertinent history of hypothyroidism on levothyroxine and hyperlipidemia who was diagnosed with Covid 11/27 and is not vaccinated.  With EMS SaO2 on RA 78%O2. ED patient was increased to 6 L oxygen satting about 92%,. CTA of the Chest was assessed and negative for pulmonary embolism but showed diffuse bilateral infiltrates. He was treated with IV remdesivir, steroids and baricitinib.  Initially, he was also treated with antibiotics but these were stopped on 12/6 with a low procalcitonin. 12/07 intubated, central line, A line, 12/08 ECMO cannulation, cortrak, attempted extubation 12/9 failed reintubated.   OT comments  Patient remains sedated at this time.  Patient was stood in Charco Bed with the tilt feature as follows: Angle: 45 degrees Total minutes: 25 BP: 131/65 Pulse:  99 SPO2: 99% on vent 10 Peep.  B hands are swollen, OT performed PROM as able and encouraged positioning of hands above elbows with pillows.  OT will continue to follow in the acute setting.  Hopefully as sedation level lessens, OT can begin to focus on more functional tasks.  Expect a long recovery timeframe and post acute rehab will be necessary.  CIR has been recommended.    Follow Up Recommendations  CIR    Equipment Recommendations       Recommendations for Other Services      Precautions / Restrictions Restrictions Weight Bearing Restrictions: No       Mobility Bed Mobility               General bed mobility comments: Continued: Total A +2 for reposition in bed. Use of krege tilt bed for semi standing. +2 to manage line and safety.  Transfers                 General transfer comment: Continued to utilized tilt bed to come to semi-standing position    Balance                                            ADL either performed or assessed with clinical judgement   ADL                                         General ADL Comments: Total A for ADLs.  Patient remains sedated.                       Cognition Arousal/Alertness: Suspect due to medications                                              Exercises     Shoulder Instructions       General Comments      Pertinent Vitals/ Pain       Faces Pain Scale: No hurt Pain Location: no change in response to nail bed pressure or in elevated position Pain Intervention(s): Monitored during session  Frequency  Min 2X/week        Progress Toward Goals  OT Goals(current goals can now be found in the care plan section)  Progress towards OT goals: Progressing toward goals  Acute Rehab OT Goals Patient Stated Goal: Patient tolerating increasing angle in stand table.  Up to 45 degrees. OT Goal Formulation: Patient unable to participate in goal setting Time For Goal Achievement: 09/26/20 Potential to Achieve Goals: Fair  Plan Discharge plan remains appropriate    Co-evaluation    PT/OT/SLP Co-Evaluation/Treatment: Yes Reason for Co-Treatment: Complexity of the patient's impairments (multi-system involvement);For patient/therapist safety;To address functional/ADL transfers   OT goals addressed during session: Other (comment) (stand tolerance)      AM-PAC OT "6 Clicks" Daily Activity     Outcome Measure   Help from another person eating meals?: Total Help from another person taking care of personal grooming?: Total Help from another person toileting, which includes using toliet, bedpan, or urinal?: Total Help from another person bathing (including washing, rinsing, drying)?: Total Help from another person to put on and taking off regular upper body clothing?: Total Help from another person to put on  and taking off regular lower body clothing?: Total 6 Click Score: 6    End of Session    OT Visit Diagnosis: Unsteadiness on feet (R26.81);Other abnormalities of gait and mobility (R26.89);Muscle weakness (generalized) (M62.81);Pain   Activity Tolerance Patient limited by fatigue;Patient limited by lethargy   Patient Left in bed;with call bell/phone within reach;with nursing/sitter in room   Nurse Communication          Time: 1015-1100 OT Time Calculation (min): 45 min  Charges: OT General Charges $OT Visit: 1 Visit OT Treatments $Therapeutic Activity: 8-22 mins  09/15/2020  Donald Rowe, OTR/L  Acute Rehabilitation Services  Office:  [phone]    [name] 09/15/2020, 11:53 AM

## 2020-09-15 NOTE — Progress Notes (Signed)
Patient ID: Donald Rowe, male   DOB: [birth year], 59 y.o.   MRN: [id number]    Advanced Heart Failure Rounding Note   Subjective:    12/8 Cannulated for VV ECMO - 32 FR  RIJ [place] 12/9 Extubated/Reintubated for altered mental status 12/13 Circuit change for elevated LDH 12/15 PLEX started 12/16 Tracheostomy  Underwent trach yesterday. Has some bleeding when he coughs. Remains sedated on ECMO. Agitated and hypertensive at times.   Sweep up to 13   ECMO   Speed 4000 Flow 4.9L  Sweep 13 dP 24 pVen  -107  ABG: 7.33/44/51/84% Hgb 9.6 LDH 369 -> 447 -> 743 -> 1081 -> 1569 -> 1202 -> 1294 -> 1295 -> 695 -> 916 Lactic acid 2.7 PTT 55  Vent 40% TVs 400s CXR: diffuse bilateral infiltrates. Perhaps slightly improved. Cannula position looks good today   Objective:   Weight Range:  Vital Signs:   Temp:  [97.88 F (36.6 C)-98.78 F (37.1 C)] 98.24 F (36.8 C) (12/17 0700) Pulse Rate:  [87-129] 116 (12/17 0700) Resp:  [10-31] 14 (12/17 0700) BP: (101-206)/(47-77) 147/63 (12/17 0700) SpO2:  [86 %-98 %] 86 % (12/17 0700) Arterial Line BP: (129-220)/(39-69) 137/50 (12/17 0700) FiO2 (%):  [40 %-100 %] 90 % (12/17 0429) Weight:  [119.4 kg] 119.4 kg (12/17 0100) Last BM Date: 09/12/20  Weight change: Filed Weights   09/13/20 0300 09/14/20 0500 09/15/20 0100  Weight: 116.5 kg 119.5 kg 119.4 kg    Intake/Output:   Intake/Output Summary (Last 24 hours) at 09/15/2020 0739 Last data filed at 09/15/2020 0600 Gross per 24 hour  Intake 4304.96 ml  Output 3950 ml  Net 354.96 ml     Physical Exam: General:  Sedated on ECMO HEENT: normal Neck: supple. + RIJ ECMO cannula. + Trach  Cor: PMI nondisplaced. Regular tachy No rubs, gallops or murmurs. Lungs: coarse Abdomen: obese soft, nontender, nondistended. No hepatosplenomegaly. No bruits or masses. Good bowel sounds. Extremities: no cyanosis, clubbing, rash, edema R hand mildly dusky Neuro: sedated   Telemetry: sinus  100-120 Personally reviewed  Labs: Basic Metabolic Panel: Recent Labs  Lab 09/09/20 0458 09/09/20 0501 09/11/20 1615 09/11/20 1617 09/13/20 1700 09/13/20 2016 09/13/20 2100 09/14/20 0352 09/14/20 0401 09/14/20 1645 09/14/20 1649 09/14/20 2217 09/15/20 0046 09/15/20 0312 09/15/20 0326 09/15/20 0623  NA 148*   < > 146*   < > 145   < > 144 146*   < > 150*   < > 149* 149* 149* 149* 149*  K 4.4   < > 3.9   < > 4.8   < > 4.8 4.9   < > 5.2*   < > 4.9 4.7 4.8 4.7 4.7  CL 118*   < > 111   < > 112*  --  112* 113*  --  116*  --   --   --  117*  --   --   CO2 25   < > 25   < > 25  --  26 23  --  24  --   --   --  23  --   --   GLUCOSE 231*   < > 142*   < > 220*  --  237* 205*  --  155*  --   --   --  215*  --   --   BUN 40*   < > 51*   < > 77*  --  80* 73*  --  71*  --   --   --  82*  --   --   CREATININE 0.94   < > 1.16   < > 1.57*  --  1.56* 1.46*  --  1.33*  --   --   --  1.55*  --   --   CALCIUM 7.9*   < > 7.7*   < > 8.0*  --  8.1* 7.9*  --  8.1*  --   --   --  8.0*  --   --   MG 2.7*  --   --   --   --   --   --   --   --   --   --   --   --   --   --   --   PHOS 2.1*  --  3.4  --   --   --   --   --   --   --   --   --   --   --   --   --    < > = values in this interval not displayed.    Liver Function Tests: Recent Labs  Lab 09/11/20 0503 09/12/20 0352 09/13/20 0304 09/14/20 0352 09/15/20 0312  AST 69* 52* 56* 38 44*  ALT 44 36 36 27 37  ALKPHOS 128* 137* 153* 102 137*  BILITOT 3.7* 2.2* 2.6* 2.2* 2.5*  PROT 4.2* 4.7* 5.1* 4.5* 4.5*  ALBUMIN 1.7* 1.9* 1.9* 3.1* 2.7*   No results for input(s): LIPASE, AMYLASE in the last 168 hours. Recent Labs  Lab 09/14/20 0821  AMMONIA 48*    CBC: Recent Labs  Lab 09/13/20 0304 09/13/20 0325 09/13/20 1700 09/13/20 2016 09/14/20 0352 09/14/20 0401 09/14/20 1645 09/14/20 1649 09/14/20 2217 09/15/20 0046 09/15/20 0312 09/15/20 0326 09/15/20 0623  WBC 15.6*  --  17.5*  --  18.8*  --  22.0*  --   --   --  21.7*  --    --   HGB 8.5*   < > 7.9*   < > 9.6*   < > 9.9*   < > 8.8* 9.5* 9.2* 8.8* 8.8*  HCT 26.7*   < > 25.0*   < > 28.2*   < > 29.7*   < > 26.0* 28.0* 29.5* 26.0* 26.0*  MCV 95.4  --  95.8  --  94.6  --  96.4  --   --   --  98.7  --   --   PLT 131*  --  139*  --  135*  --  153  --   --   --  150  --   --    < > = values in this interval not displayed.    Cardiac Enzymes: No results for input(s): CKTOTAL, CKMB, CKMBINDEX, TROPONINI in the last 168 hours.  BNP: BNP (last 3 results) No results for input(s): BNP in the last 8760 hours.  ProBNP (last 3 results) No results for input(s): PROBNP in the last 8760 hours.    Other results:  Imaging: DG CHEST PORT 1 VIEW  Result Date: 09/14/2020 CLINICAL DATA:  COVID-19 positivity on ECMO with acute respiratory distress EXAM: PORTABLE CHEST 1 VIEW COMPARISON:  09/14/2020 FINDINGS: Cardiac shadow is stable. Tracheostomy tube is now seen in satisfactory position. Feeding catheter and left jugular central line are again noted. ECMO cannula is seen in satisfactory position. Diffuse airspace opacity is noted in both lungs with air bronchograms similar to that seen on the prior exam. Bilateral pleural  effusions are noted as well. No bony abnormality is seen. IMPRESSION: New tracheostomy tube in place. The remainder of the exam is stable from the prior study. Electronically Signed   By: Inez Catalina M.D.   On: 09/14/2020 12:35   DG CHEST PORT 1 VIEW  Result Date: 09/14/2020 CLINICAL DATA:  COVID-19 positive. EXAM: PORTABLE CHEST 1 VIEW COMPARISON:  September 13, 2020. FINDINGS: Endotracheal tube and feeding tube are in good position. ECMO device is unchanged. Left internal jugular catheter is unchanged. Stable diffuse bilateral lung opacities are noted consistent with pneumonia. No pneumothorax is noted. Bony thorax is unremarkable. IMPRESSION: Stable support apparatus. Stable diffuse bilateral lung opacities are noted consistent with pneumonia. Electronically  Signed   By: Marijo Conception M.D.   On: 09/14/2020 08:04     Medications:     Scheduled Medications: . amLODipine  5 mg Per Tube Daily  . aspirin  81 mg Per Tube Daily  . chlorhexidine gluconate (MEDLINE KIT)  15 mL Mouth Rinse BID  . Chlorhexidine Gluconate Cloth  6 each Topical Daily  . clonazePAM  1 mg Per Tube BID  . cloNIDine  0.1 mg Per Tube BID  . docusate  100 mg Per Tube BID  . feeding supplement (PIVOT 1.5 CAL)  1,000 mL Per Tube Q24H  . feeding supplement (PROSource TF)  45 mL Per Tube QID  . fentaNYL (SUBLIMAZE) injection  200 mcg Intravenous Once  . fiber  1 packet Per Tube BID  . free water  200 mL Per Tube Q2H  . insulin aspart  2-6 Units Subcutaneous Q4H  . insulin aspart  8 Units Subcutaneous Q4H  . insulin detemir  24 Units Subcutaneous Q12H  . lactulose  20 g Per Tube BID  . levothyroxine  50 mcg Per Tube Q0600  . mouth rinse  15 mL Mouth Rinse 10 times per day  . methylPREDNISolone (SOLU-MEDROL) injection  60 mg Intravenous Q6H  . midazolam  5 mg Intravenous Once  . oxyCODONE  5 mg Per Tube Q6H  . pantoprazole sodium  40 mg Per Tube Daily  . pravastatin  40 mg Per Tube q1800  . QUEtiapine  100 mg Per Tube BID  . sodium chloride flush  10-40 mL Intracatheter Q12H  . valproic acid  500 mg Per Tube BID    Infusions: . sodium chloride 10 mL/hr at 09/15/20 0600  . albumin human Stopped (09/11/20 1900)  . bivalirudin (ANGIOMAX) infusion 0.5 mg/mL (Non-ACS indications) 0.22 mg/kg/hr (09/15/20 0600)  . citrate dextrose Stopped (09/13/20 2315)  . clevidipine 9 mg/hr (09/15/20 0601)  . dexmedetomidine (PRECEDEX) IV infusion Stopped (09/14/20 0805)  . dextrose    . fentaNYL infusion INTRAVENOUS 400 mcg/hr (09/15/20 0603)  . midazolam 10 mg/hr (09/15/20 0600)  . norepinephrine (LEVOPHED) Adult infusion Stopped (09/12/20 1530)  . propofol (DIPRIVAN) infusion 10 mcg/kg/min (09/15/20 0600)  . vancomycin Stopped (09/15/20 0444)    PRN Medications: sodium  chloride, acetaminophen (TYLENOL) oral liquid 160 mg/5 mL, albumin human, dextrose, diphenhydrAMINE, fentaNYL, heparin, labetalol, LORazepam, ondansetron **OR** ondansetron (ZOFRAN) IV, polyethylene glycol, sodium chloride flush   Assessment/Plan:   1. Acute hypoxic respiratory failure/ARDS in setting of COVID-19 PNA - Cannulated for VV ECMO on 12/8 after failing intubation x 2 days - has complete remedisivir, baricitinib, Solumedrol.  - continue abx (cefepime) - stop dates in place  - staph in tracheal aspirate -  vanc added 12/14 - circuit changed 12/13 for elevated LDH and lower flows. PLEX started 12/15.  LDH 1,290 -> (PLEX) - > 685 -> 916, Today is PLEX #2. Continue PLEX  qOD for now - CXR with severe diffuse infiltrates. Slightly better. Cannula position ok - PTT 55 Discussed dosing with PharmD personally. - Driving pressure ok - s/p trach 12/16 - Sweep up to 13. ? Worsening acidosis/infection -> bronch today. Restart cefepime - Adjust sedation - 1 dose lasix  2. Bilateral DVT - remains on bival - Dosing d/w PharmD  3. DM2 - on insulin gtt. CBGs high. Adjusting today  4. Obesity -Body mass index is 33.73 kg/m.  5. F/E/N - Na 147 -> 153 -> 146 -> 149 - Continue free water  6. HTN - Continue clonidine - restart metoprolol 25 tid - Wean cleveprex  7. Dusky R thumb - likely embolic. Stable follow  8. AKI - creatinine improved today. BUN climbing - markedly volume overloaded. - give lasix 20IV x 1.  - can do CVVHD via vas cath line as needed  9. Constipation - lactulose +/- sorbitol  Plan discussed on multi-discplinary ECMO rounds with CCM, ECMO coordinator/specialist, PharmDs and RNs.  CRITICAL CARE Performed by: Glori Bickers  CRITICAL CARE Performed by: Glori Bickers  Total critical care time: 45 minutes  Critical care time was exclusive of separately billable procedures and treating other patients.  Critical care was necessary to treat or  prevent imminent or life-threatening deterioration.  Critical care was time spent personally by me (independent of midlevel providers or residents) on the following activities: development of treatment plan with patient and/or surrogate as well as nursing, discussions with consultants, evaluation of patient's response to treatment, examination of patient, obtaining history from patient or surrogate, ordering and performing treatments and interventions, ordering and review of laboratory studies, ordering and review of radiographic studies, pulse oximetry and re-evaluation of patient's condition.    Length of Stay: 13   Glori Bickers MD 09/15/2020, 7:39 AM  Advanced Heart Failure Team Pager [phone] (M-F; [place])  Please contact [facility] for night-coverage after hours (4p -7a ) and weekends on [email or web address]

## 2020-09-15 NOTE — Progress Notes (Addendum)
Nutrition Follow Up  DOCUMENTATION CODES:   Not applicable  INTERVENTION:   No BM documented in 3 days, lactulose ordered. Follow for results.   Increase tube feeding:  -Pivot 1.5 @ 70 ml/hr via Cortrak (1680 ml) -ProSource TF 45 ml TID -Free water flushes 200 ml Q2 hours   Provides: 2640 kcal, 191 grams protein, 1260 ml free water (3660 ml with flushes).   NUTRITION DIAGNOSIS:   Increased nutrient needs related to acute illness (COVID PNA) as evidenced by estimated needs   Ongoing  GOAL:   Patient will meet greater than or equal to 90% of their needs   Addressed via TF  MONITOR:   Vent status,Skin,Weight trends,Labs,I & O's,TF tolerance  REASON FOR ASSESSMENT:   Ventilator,Consult Enteral/tube feeding initiation and management  ASSESSMENT:   Patient with PMH significant for asthma, cancer, CHF, DM, HTN, sickle cell anemia, renal insufficiency, and hyperlipidemia. Presents this admission with severe COVID ARDS.   12/04- admit [facility] 1206- hypoxia, intubated 12/07- transfer to [facility] for ECMO 12/08- VV ECMO cannulation, post pyloric Cortrak placed 12/09- extubated, re-intubated  12/13- circuit change 12/15- started PLEX 12/16- trach   Pt discussed during ICU rounds and with RN.   Second PLEX today. Having some bleeding from trach site. Bronch showed bleeding with clot. Hypernatremia persists, free water flushes remain at 200 ml Q2 hours. Cr/BUN trending up, may require CRRT. Tolerating tube feeding at previous goal. Increase now that propofol has been d/c. No BM documented in 3 days, lactulose ordered.   Admission weight: 101.9 kg  Current weight: 119.4 kg   Patient requiring ventilator support via trach MV: 1.6 L/min Temp (24hrs), Avg:98.1 F (36.7 C), Min:97.52 F (36.4 C), Max:98.78 F (37.1 C)   UOP: 3950 ml x 24 hrs   Drips: fentanyl, versed Medications: colace, SS novolog, levemir, lactulose, solumedrol Labs: Na 150 (H) Cr 1.55 BUN 82-both up from  yesterday CBGs: 155, 197, 198, 215, 188, 168  NUTRITION FOCUSED PHYSICAL EXAM  Flowsheet Row Most Recent Value  Orbital Region No depletion  Upper Arm Region No depletion  Thoracic and Lumbar Region No depletion  Buccal Region No depletion  Temple Region No depletion  Clavicle Bone Region No depletion  Clavicle and Acromion Bone Region No depletion  Scapular Bone Region Unable to assess  Dorsal Hand No depletion  Patellar Region No depletion  Anterior Thigh Region No depletion  Posterior Calf Region No depletion  Edema (RD Assessment) Moderate  [BLE]  Hair Reviewed  Eyes Unable to assess  Mouth Unable to assess  Skin Reviewed  Nails Reviewed     Diet Order:   Diet Order            Diet NPO time specified  Diet effective midnight                 EDUCATION NEEDS:   Not appropriate for education at this time  Skin:  Skin Assessment: Skin Integrity Issues: Skin Integrity Issues:: Other (Comment) Other: skin tear- R ear  Last BM:  12/14  Height:   Ht Readings from Last 1 Encounters:  09/05/20 5\' 9"  (1.753 m)    Weight:   Wt Readings from Last 1 Encounters:  09/15/20 119.4 kg    Adjusted Body Weight:  84.5 kg   BMI:  Body mass index is 38.87 kg/m.  Estimated Nutritional Needs:   Kcal:  09/17/20 kcal  Protein:  155-195 grams  Fluid:  >/= 2 L/day  [id number] RD, LDN Clinical  Nutrition Pager listed in AMION

## 2020-09-15 NOTE — Progress Notes (Signed)
NAME:  Donald Rowe, MRN:  [id number], DOB:  [birth year], LOS: 26 ADMISSION DATE:  09/01/2020, CONSULTATION DATE:  12/6 REFERRING MD:  Dr. Aileen Fass, CHIEF COMPLAINT:  SOB    Brief History   59 y/o M admitted 12/4 with 1 week hx of SOB, known COVID positive.  He is unvaccinated.  CTA chest negative for PE but demonstrated diffuse bilateral infiltrates.   History of present illness   59 y/o unvaccinated male, admitted 12/4 with a one week history of shortness of breath.    The patient began feeling poorly the day before Thanksgiving with fever, weakness and body aches. He tested positive for COVID on 12/27 prior to presentation.  He developed progressive shortness of breath.  In the ER, the patient was found to have saturations of 78%.  He was started on 6L O2 with initial improvement in saturations.  The patient was admitted per [facility]. CTA of the Chest was assessed and negative for pulmonary embolism but showed diffuse bilateral infiltrates. He was treated with IV remdesivir, steroids and baricitinib.  Initially, he was also treated with antibiotics but these were stopped on 12/6 with a low procalcitonin.  LE doppler negative for DVT. The patient elected to be DNR / no intubation.  He developed worsening respiratory failure with hypoxia requiring 60L flow 100% and PCCM consulted 12/6 for evaluation.   Past Medical History  Hypothyroidism  HLD   Significant Hospital Events/Procedures  12/04 Admit  12/06 PCCM consulted  12/07 Intubated, central line, a line 12/08 VV ECMO Cannulaton, cortrak 12/13 circuit change for hemolytic anemia  Consults:  PCCM, Heart failure, TCTS  Significant Diagnostic Tests:   CTA Chest 12/4 >> extensive bilateral airspace disease, no large PE identified, limited study   LE Venous Duplex 12/4 >> negative for DVT bilaterally   Micro Data:  COVID 12/4 >> negative  Influenza A/B 12/4 >> negative  MRSA PCR 12/5 >> negative  BCx2 12/4 >>   Antimicrobials:   Azithromycin 12/5 >> 12/6  Ceftriaxone 12/5 >> 12/6   Interim history/subjective:  Plex yesterday.  Increasing sweep overnight for metabolic acidosis.  Objective   Blood pressure (!) 147/63, pulse (!) 116, temperature 98.24 F (36.8 C), resp. rate 14, height _0  (1.753 m), weight 119.4 kg, SpO2 (!) 86 %.    Vent Mode: PCV FiO2 (%):  [90 %-100 %] 90 % Set Rate:  [12 bmp] 12 bmp PEEP:  [10 cmH20] 10 cmH20 Plateau Pressure:  [16 cmH20-24 cmH20] 24 cmH20   Intake/Output Summary (Last 24 hours) at 09/15/2020 8657 Last data filed at 09/15/2020 0600 Gross per 24 hour  Intake 3926.75 ml  Output 3675 ml  Net 251.75 ml   Filed Weights   09/13/20 0300 09/14/20 0500 09/15/20 0100  Weight: 116.5 kg 119.5 kg 119.4 kg    Examination: Constitutional: no acute distress on vent  Eyes: pinpoint, equal, reactive Ears, nose, mouth, and throat: ETT with minimal secretions Cardiovascular: RRR, ext warm Respiratory: Diminished bilaterally, +accessory muscle use Gastrointestinal: Soft, hypoactive BS, rectaltube in place Skin: No rashes, normal turgor Neurologic: withdraws to pain with sedation titration Psychiatric: RASS -4   Resolved Hospital Problem list      Assessment & Plan:   Critically ill due to acute hypoxemic and hypercarbic respiratory failure secondary to COVID PNA with severe ARDS. S/p VV ECMO cannulation 09/07/20.  Completed remdesivir.  On baricitinib.  Completed CAP coverage. Critically ill due to agitated delirium requiring titration of IV sedation. Bilateral lower ext DVTs- assocated  with COVID infection Steroid-induced hyperglycemia Hypothyroidism, HLD- on home meds Hemolysis- ongoing, circuit changed, has plateau'd somewhat.  Has staph in sputum which could be contributing.  Rheumatologic-associated MAHA remains in differential.  Will address today MRSA tracheiitis vs. HCAP- sensitivities pending  - Bronch today, evaluate for mucus plugging, BAL - Vanc x 7 days,  add cefepime, - Continue PLEX  - Gentle diuresis - Continue current basal bolus insulin regimen - Continue lung protective vent support, VAP prevention bundle - Increase enteral sedative agents.  - Working on finding good balance between Therapist, occupational and patient sedation, gets hypertensive/tachycardic with sedation weans increasing fractional shunting which complicates things, very sensitive to beta blockets   Best practice (evaluated daily)  Diet: TF Pain/Anxiety/Delirium protocol (if indicated): as above VAP protocol (if indicated): yes DVT prophylaxis: Bivalirudin GI prophylaxis: pantoprazole  Glucose control: Subcutaneous insulin basal bolus with good control.   Mobility: bedrest Last date of multidisciplinary goals of care discussion: wife updated over the phone 12/15 Family and staff present RN, physicain Summary of discussion con't aggressive care Follow up goals of care discussion due: 12/22 Code Status: full Disposition: ICU   CRITICAL CARE Performed by: Kipp Brood   Total critical care time: 45 minutes  Critical care time was exclusive of separately billable procedures and treating other patients.  Critical care was necessary to treat or prevent imminent or life-threatening deterioration.  Critical care was time spent personally by me on the following activities: development of treatment plan with patient and/or surrogate as well as nursing, discussions with consultants, evaluation of patient's response to treatment, examination of patient, obtaining history from patient or surrogate, ordering and performing treatments and interventions, ordering and review of laboratory studies, ordering and review of radiographic studies, pulse oximetry, re-evaluation of patient's condition and participation in multidisciplinary rounds.  Kipp Brood, MD [facility] ICU Physician [place]  Pager: [phone] Mobile: [phone] After hours:  [phone].

## 2020-09-15 NOTE — Procedures (Signed)
Extracorporeal support note     ECLS cannulation: 09/01/2020 Last circuit change: 09/11/2020 Indication: COVD ARDS   Configuration: VV, RIJ 32Fr Crescent   Pump speed: 4000 Pump flow: 5.05 Pump used: Cardiohelp   Sweep gas: 100%, 13LPM   Circuit check: no clots Anticoagulant: bivalirudin Anticoagulation targets: PTT 50-70 -6-hour hold for bloody endotracheal secretions   Changes in support: Received Plax yesterday, continue current support.  Does not appear he will be able to tolerate awake ECMO due to delirium.  Septic work-up   Anticipated goals/duration of support: bridge to recovery  Lynnell Catalan, MD [facility] ICU Physician [facility] [place] [facility]  Pager: [phone] Or Epic Secure Chat After hours: [phone].  09/15/2020, 9:04 AM

## 2020-09-16 ENCOUNTER — Inpatient Hospital Stay (HOSPITAL_COMMUNITY): Payer: HRSA Program

## 2020-09-16 DIAGNOSIS — Z515 Encounter for palliative care: Secondary | ICD-10-CM | POA: Diagnosis not present

## 2020-09-16 DIAGNOSIS — D599 Acquired hemolytic anemia, unspecified: Secondary | ICD-10-CM | POA: Diagnosis not present

## 2020-09-16 DIAGNOSIS — D649 Anemia, unspecified: Secondary | ICD-10-CM

## 2020-09-16 DIAGNOSIS — J80 Acute respiratory distress syndrome: Secondary | ICD-10-CM | POA: Diagnosis not present

## 2020-09-16 DIAGNOSIS — U071 COVID-19: Secondary | ICD-10-CM | POA: Diagnosis not present

## 2020-09-16 DIAGNOSIS — J9601 Acute respiratory failure with hypoxia: Secondary | ICD-10-CM | POA: Diagnosis not present

## 2020-09-16 DIAGNOSIS — Z9281 Personal history of extracorporeal membrane oxygenation (ECMO): Secondary | ICD-10-CM | POA: Diagnosis not present

## 2020-09-16 DIAGNOSIS — Z9911 Dependence on respirator [ventilator] status: Secondary | ICD-10-CM | POA: Diagnosis not present

## 2020-09-16 DIAGNOSIS — D696 Thrombocytopenia, unspecified: Secondary | ICD-10-CM

## 2020-09-16 DIAGNOSIS — Z7189 Other specified counseling: Secondary | ICD-10-CM

## 2020-09-16 LAB — BASIC METABOLIC PANEL
Anion gap: 8 (ref 5–15)
Anion gap: 7 (ref 5–15)
Anion gap: 7 (ref 5–15)
BUN: 84 mg/dL — ABNORMAL HIGH (ref 6–20)
BUN: 80 mg/dL — ABNORMAL HIGH (ref 6–20)
BUN: 79 mg/dL — ABNORMAL HIGH (ref 6–20)
CO2: 22 mmol/L (ref 22–32)
CO2: 21 mmol/L — ABNORMAL LOW (ref 22–32)
CO2: 20 mmol/L — ABNORMAL LOW (ref 22–32)
Calcium: 7.8 mg/dL — ABNORMAL LOW (ref 8.9–10.3)
Calcium: 7.4 mg/dL — ABNORMAL LOW (ref 8.9–10.3)
Calcium: 7.6 mg/dL — ABNORMAL LOW (ref 8.9–10.3)
Chloride: 118 mmol/L — ABNORMAL HIGH (ref 98–111)
Chloride: 121 mmol/L — ABNORMAL HIGH (ref 98–111)
Chloride: 122 mmol/L — ABNORMAL HIGH (ref 98–111)
Creatinine, Ser: 1.35 mg/dL — ABNORMAL HIGH (ref 0.61–1.24)
Creatinine, Ser: 1.26 mg/dL — ABNORMAL HIGH (ref 0.61–1.24)
Creatinine, Ser: 1.32 mg/dL — ABNORMAL HIGH (ref 0.61–1.24)
GFR, Estimated: >60 mL/min (ref >60)
GFR, Estimated: >60 mL/min (ref >60)
GFR, Estimated: >60 mL/min (ref >60)
Glucose, Bld: 215 mg/dL — ABNORMAL HIGH (ref 70–99)
Glucose, Bld: 174 mg/dL — ABNORMAL HIGH (ref 70–99)
Glucose, Bld: 204 mg/dL — ABNORMAL HIGH (ref 70–99)
Potassium: 5 mmol/L (ref 3.5–5.1)
Potassium: 4.8 mmol/L (ref 3.5–5.1)
Potassium: 5.2 mmol/L — ABNORMAL HIGH (ref 3.5–5.1)
Sodium: 148 mmol/L — ABNORMAL HIGH (ref 135–145)
Sodium: 149 mmol/L — ABNORMAL HIGH (ref 135–145)
Sodium: 149 mmol/L — ABNORMAL HIGH (ref 135–145)

## 2020-09-16 LAB — TRIGLYCERIDES: Triglycerides: 100 mg/dL (ref <150)

## 2020-09-16 LAB — POCT I-STAT 7, (LYTES, BLD GAS, ICA,H+H)
Acid-base deficit: 3 mmol/L — ABNORMAL HIGH (ref 0.0–2.0)
Acid-base deficit: 2 mmol/L (ref 0.0–2.0)
Acid-base deficit: 4 mmol/L — ABNORMAL HIGH (ref 0.0–2.0)
Acid-base deficit: 4 mmol/L — ABNORMAL HIGH (ref 0.0–2.0)
Bicarbonate: 23.3 mmol/L (ref 20.0–28.0)
Bicarbonate: 23.9 mmol/L (ref 20.0–28.0)
Bicarbonate: 21.5 mmol/L (ref 20.0–28.0)
Bicarbonate: 22.1 mmol/L (ref 20.0–28.0)
Calcium, Ion: 1.28 mmol/L (ref 1.15–1.40)
Calcium, Ion: 1.26 mmol/L (ref 1.15–1.40)
Calcium, Ion: 1.3 mmol/L (ref 1.15–1.40)
Calcium, Ion: 1.28 mmol/L (ref 1.15–1.40)
HCT: 23 % — ABNORMAL LOW (ref 39.0–52.0)
HCT: 21 % — ABNORMAL LOW (ref 39.0–52.0)
HCT: 22 % — ABNORMAL LOW (ref 39.0–52.0)
HCT: 21 % — ABNORMAL LOW (ref 39.0–52.0)
Hemoglobin: 7.8 g/dL — ABNORMAL LOW (ref 13.0–17.0)
Hemoglobin: 7.1 g/dL — ABNORMAL LOW (ref 13.0–17.0)
Hemoglobin: 7.5 g/dL — ABNORMAL LOW (ref 13.0–17.0)
Hemoglobin: 7.1 g/dL — ABNORMAL LOW (ref 13.0–17.0)
O2 Saturation: 82 %
O2 Saturation: 86 %
O2 Saturation: 85 %
O2 Saturation: 89 %
Patient temperature: 36.7
Patient temperature: 36.7
Patient temperature: 36.6
Patient temperature: 36.8
Potassium: 4.9 mmol/L (ref 3.5–5.1)
Potassium: 5 mmol/L (ref 3.5–5.1)
Potassium: 4.7 mmol/L (ref 3.5–5.1)
Potassium: 5.1 mmol/L (ref 3.5–5.1)
Sodium: 151 mmol/L — ABNORMAL HIGH (ref 135–145)
Sodium: 152 mmol/L — ABNORMAL HIGH (ref 135–145)
Sodium: 150 mmol/L — ABNORMAL HIGH (ref 135–145)
Sodium: 150 mmol/L — ABNORMAL HIGH (ref 135–145)
TCO2: 25 mmol/L (ref 22–32)
TCO2: 25 mmol/L (ref 22–32)
TCO2: 23 mmol/L (ref 22–32)
TCO2: 23 mmol/L (ref 22–32)
pCO2 arterial: 45.8 mmHg (ref 32.0–48.0)
pCO2 arterial: 42.8 mmHg (ref 32.0–48.0)
pCO2 arterial: 41.1 mmHg (ref 32.0–48.0)
pCO2 arterial: 43.4 mmHg (ref 32.0–48.0)
pH, Arterial: 7.312 — ABNORMAL LOW (ref 7.350–7.450)
pH, Arterial: 7.353 (ref 7.350–7.450)
pH, Arterial: 7.326 — ABNORMAL LOW (ref 7.350–7.450)
pH, Arterial: 7.315 — ABNORMAL LOW (ref 7.350–7.450)
pO2, Arterial: 50 mmHg — ABNORMAL LOW (ref 83.0–108.0)
pO2, Arterial: 54 mmHg — ABNORMAL LOW (ref 83.0–108.0)
pO2, Arterial: 53 mmHg — ABNORMAL LOW (ref 83.0–108.0)
pO2, Arterial: 60 mmHg — ABNORMAL LOW (ref 83.0–108.0)

## 2020-09-16 LAB — LACTATE DEHYDROGENASE: LDH: 502 U/L — ABNORMAL HIGH (ref 98–192)

## 2020-09-16 LAB — GLUCOSE, CAPILLARY
Glucose-Capillary: 190 mg/dL — ABNORMAL HIGH (ref 70–99)
Glucose-Capillary: 188 mg/dL — ABNORMAL HIGH (ref 70–99)
Glucose-Capillary: 169 mg/dL — ABNORMAL HIGH (ref 70–99)
Glucose-Capillary: 177 mg/dL — ABNORMAL HIGH (ref 70–99)
Glucose-Capillary: 184 mg/dL — ABNORMAL HIGH (ref 70–99)
Glucose-Capillary: 170 mg/dL — ABNORMAL HIGH (ref 70–99)
Glucose-Capillary: 186 mg/dL — ABNORMAL HIGH (ref 70–99)

## 2020-09-16 LAB — CBC
HCT: 25 % — ABNORMAL LOW (ref 39.0–52.0)
HCT: 26.7 % — ABNORMAL LOW (ref 39.0–52.0)
Hemoglobin: 7.8 g/dL — ABNORMAL LOW (ref 13.0–17.0)
Hemoglobin: 8.2 g/dL — ABNORMAL LOW (ref 13.0–17.0)
MCH: 30.7 pg (ref 26.0–34.0)
MCH: 29.9 pg (ref 26.0–34.0)
MCHC: 31.2 g/dL (ref 30.0–36.0)
MCHC: 30.7 g/dL (ref 30.0–36.0)
MCV: 98.4 fL (ref 80.0–100.0)
MCV: 97.4 fL (ref 80.0–100.0)
Platelets: 122 10*3/uL — ABNORMAL LOW (ref 150–400)
Platelets: 122 10*3/uL — ABNORMAL LOW (ref 150–400)
RBC: 2.54 MIL/uL — ABNORMAL LOW (ref 4.22–5.81)
RBC: 2.74 MIL/uL — ABNORMAL LOW (ref 4.22–5.81)
RDW: 17 % — ABNORMAL HIGH (ref 11.5–15.5)
RDW: 18.7 % — ABNORMAL HIGH (ref 11.5–15.5)
WBC: 13.2 10*3/uL — ABNORMAL HIGH (ref 4.0–10.5)
WBC: 14.4 10*3/uL — ABNORMAL HIGH (ref 4.0–10.5)
nRBC: 0.8 % — ABNORMAL HIGH (ref 0.0–0.2)
nRBC: 1.4 % — ABNORMAL HIGH (ref 0.0–0.2)

## 2020-09-16 LAB — HEPATIC FUNCTION PANEL
ALT: 27 U/L (ref 0–44)
AST: 31 U/L (ref 15–41)
Albumin: 3 g/dL — ABNORMAL LOW (ref 3.5–5.0)
Alkaline Phosphatase: 73 U/L (ref 38–126)
Bilirubin, Direct: 1.2 mg/dL — ABNORMAL HIGH (ref 0.0–0.2)
Indirect Bilirubin: 1.2 mg/dL — ABNORMAL HIGH (ref 0.3–0.9)
Total Bilirubin: 2.4 mg/dL — ABNORMAL HIGH (ref 0.3–1.2)
Total Protein: 4 g/dL — ABNORMAL LOW (ref 6.5–8.1)

## 2020-09-16 LAB — PREPARE RBC (CROSSMATCH)

## 2020-09-16 LAB — FIBRINOGEN: Fibrinogen: 114 mg/dL — ABNORMAL LOW (ref 210–475)

## 2020-09-16 LAB — PROTIME-INR
INR: 3.4 — ABNORMAL HIGH (ref 0.8–1.2)
Prothrombin Time: 32.9 seconds — ABNORMAL HIGH (ref 11.4–15.2)

## 2020-09-16 LAB — LACTIC ACID, PLASMA
Lactic Acid, Venous: 1.2 mmol/L (ref 0.5–1.9)
Lactic Acid, Venous: 1.3 mmol/L (ref 0.5–1.9)

## 2020-09-16 LAB — RESP PANEL BY RT-PCR (FLU A&B, COVID) ARPGX2
Influenza A by PCR: NEGATIVE
Influenza B by PCR: NEGATIVE
SARS Coronavirus 2 by RT PCR: POSITIVE — AB

## 2020-09-16 LAB — APTT
aPTT: 64 seconds — ABNORMAL HIGH (ref 24–36)
aPTT: 76 seconds — ABNORMAL HIGH (ref 24–36)
aPTT: 66 seconds — ABNORMAL HIGH (ref 24–36)

## 2020-09-16 MED ORDER — PHENYLEPHRINE HCL-NACL 10-0.9 MG/250ML-% IV SOLN
25.0000 ug/min | INTRAVENOUS | Status: DC
  Administered 2020-09-16: 11:00:00 200 ug/min via INTRAVENOUS
  Filled 2020-09-16: qty 250

## 2020-09-16 MED ORDER — INSULIN DETEMIR 100 UNIT/ML ~~LOC~~ SOLN
36.0000 [IU] | Freq: Two times a day (BID) | SUBCUTANEOUS | Status: DC
  Administered 2020-09-16 (×2): 36 [IU] via SUBCUTANEOUS
  Filled 2020-09-16 (×4): qty 0.36

## 2020-09-16 MED ORDER — PHENYLEPHRINE HCL-NACL 10-0.9 MG/250ML-% IV SOLN
INTRAVENOUS | Status: AC
  Administered 2020-09-16: 10:00:00 200 ug/min via INTRAVENOUS
  Filled 2020-09-16: qty 250

## 2020-09-16 MED ORDER — METOPROLOL TARTRATE 50 MG PO TABS
50.0000 mg | ORAL_TABLET | Freq: Three times a day (TID) | ORAL | Status: DC
  Administered 2020-09-16 – 2020-09-18 (×7): 50 mg
  Filled 2020-09-16 (×6): qty 1

## 2020-09-16 MED ORDER — SODIUM CHLORIDE 0.9 % IV SOLN
0.1200 mg/kg/h | INTRAVENOUS | Status: DC
  Administered 2020-09-16 (×2): 0.17 mg/kg/h via INTRAVENOUS
  Administered 2020-09-17: 04:00:00 0.13 mg/kg/h via INTRAVENOUS
  Administered 2020-09-17 – 2020-09-18 (×2): 0.11 mg/kg/h via INTRAVENOUS
  Administered 2020-09-19: 17:00:00 0.13 mg/kg/h via INTRAVENOUS
  Administered 2020-09-20 – 2020-09-22 (×3): 0.12 mg/kg/h via INTRAVENOUS
  Filled 2020-09-16 (×10): qty 250

## 2020-09-16 MED ORDER — CALCIUM GLUCONATE 10 % IV SOLN: 2.0000 g | Freq: Once | INTRAVENOUS | Status: DC

## 2020-09-16 MED ORDER — SODIUM CHLORIDE 0.9 % IV SOLN
4.0000 g | Freq: Once | INTRAVENOUS | Status: AC
  Administered 2020-09-16: 09:00:00 4 g via INTRAVENOUS
  Filled 2020-09-16: qty 40

## 2020-09-16 MED ORDER — HEPARIN SODIUM (PORCINE) 1000 UNIT/ML IJ SOLN
1000.0000 [IU] | Freq: Once | INTRAMUSCULAR | Status: AC
  Administered 2020-09-16: 11:00:00 1000 [IU]
  Filled 2020-09-16: qty 1

## 2020-09-16 MED ORDER — SODIUM CHLORIDE 0.9 % IV SOLN
250.0000 mL | INTRAVENOUS | Status: DC
  Administered 2020-09-17: 10:00:00 1000 mL via INTRAVENOUS
  Administered 2020-09-18 – 2020-10-07 (×5): 250 mL via INTRAVENOUS

## 2020-09-16 MED ORDER — ACD FORMULA A 0.73-2.45-2.2 GM/100ML VI SOLN: 1000.0000 mL | Status: DC

## 2020-09-16 MED ORDER — ALBUTEROL SULFATE (2.5 MG/3ML) 0.083% IN NEBU
2.5000 mg | INHALATION_SOLUTION | RESPIRATORY_TRACT | Status: DC | PRN
  Administered 2020-09-16 – 2020-10-02 (×2): 2.5 mg via RESPIRATORY_TRACT
  Filled 2020-09-16 (×2): qty 3

## 2020-09-16 MED ORDER — DIPHENHYDRAMINE HCL 25 MG PO CAPS: 25.0000 mg | ORAL_CAPSULE | Freq: Four times a day (QID) | ORAL | Status: DC | PRN

## 2020-09-16 MED ORDER — SODIUM CHLORIDE 0.9% IV SOLUTION: Freq: Once | INTRAVENOUS | Status: AC

## 2020-09-16 MED ORDER — ALBUMIN HUMAN 25 % IV SOLN
25.0000 g | Freq: Once | INTRAVENOUS | Status: AC
  Administered 2020-09-30: 25 g via INTRAVENOUS
  Filled 2020-09-16 (×2): qty 100

## 2020-09-16 MED ORDER — ACETAMINOPHEN 325 MG PO TABS: 650.0000 mg | ORAL_TABLET | ORAL | Status: DC | PRN

## 2020-09-16 MED ORDER — SODIUM CHLORIDE 0.9 % IV SOLN
INTRAVENOUS | Status: AC
  Filled 2020-09-16 (×4): qty 200

## 2020-09-16 MED ORDER — PHENYLEPHRINE CONCENTRATED 100MG/250ML (0.4 MG/ML) INFUSION SIMPLE
0.0000 ug/min | INTRAVENOUS | Status: DC
  Administered 2020-09-16: 60 ug/min via INTRAVENOUS
  Filled 2020-09-16 (×2): qty 250

## 2020-09-16 MED ORDER — ACD FORMULA A 0.73-2.45-2.2 GM/100ML VI SOLN
Status: AC
  Filled 2020-09-16: qty 500

## 2020-09-16 MED ORDER — FUROSEMIDE 10 MG/ML IJ SOLN
20.0000 mg | Freq: Once | INTRAMUSCULAR | Status: AC
  Administered 2020-09-16: 09:00:00 20 mg via INTRAVENOUS
  Filled 2020-09-16: qty 2

## 2020-09-16 MED ORDER — CLONIDINE HCL 0.2 MG PO TABS
0.2000 mg | ORAL_TABLET | Freq: Two times a day (BID) | ORAL | Status: DC
  Administered 2020-09-16 (×2): 0.2 mg
  Filled 2020-09-16 (×2): qty 1

## 2020-09-16 NOTE — Progress Notes (Signed)
Patient ID: Donald Rowe, male   DOB: [birth year], 59 y.o.   MRN: [id number]    Advanced Heart Failure Rounding Note   Subjective:    12/8 Cannulated for VV ECMO - 32 FR  RIJ Crescent 12/9 Extubated/Reintubated for altered mental status 12/13 Circuit change for elevated LDH 12/15 PLEX started 12/16 Tracheostomy  Continues to have bleeding around trach. Got 1u RBCs overnight. Meropenum started for worsening acidosis yesterday. Remains agitated. Diuresed well on IV lasix.  Sweep up to 13   ECMO   Speed 4000 Flow 5.2L  Sweep 13 dP 23 pVen  -102  ABG: 7.35/43/54/86% Hgb 7.8 LDH 369 -> 447 -> 743 -> 1081 -> 1569 -> 1202 -> 1294 -> 1295  -> PLEX #1 -> 695 -> 916 -> PLEX #2  -> 502 Lactic acid 1.2 PTT 64  Vent 40% TVs 200s CXR: diffuse bilateral infiltrates. Slightly worse today. Cannula position ok. Personally reviewed   Objective:   Weight Range:  Vital Signs:   Temp:  [97.52 F (36.4 C)-98.24 F (36.8 C)] 98.06 F (36.7 C) (12/18 0800) Pulse Rate:  [92-119] 117 (12/18 0800) Resp:  [0-29] 17 (12/18 0800) BP: (111-188)/(49-69) 188/55 (12/17 1629) SpO2:  [83 %-100 %] 89 % (12/18 0800) Arterial Line BP: (93-212)/(38-72) 198/68 (12/18 0800) FiO2 (%):  [80 %-100 %] 80 % (12/18 0433) Weight:  [122.5 kg] 122.5 kg (12/18 0030) Last BM Date: 09/12/20  Weight change: Filed Weights   09/14/20 0500 09/15/20 0100 09/16/20 0030  Weight: 119.5 kg 119.4 kg 122.5 kg    Intake/Output:   Intake/Output Summary (Last 24 hours) at 09/16/2020 0810 Last data filed at 09/16/2020 0715 Gross per 24 hour  Intake 5750.68 ml  Output 4200 ml  Net 1550.68 ml     Physical Exam: General: Awake on vent/ECMO. Not following commands HEENT: normal Neck: supple. + ECMO cannula + trach . Carotids 2+ bilat; no bruits. No lymphadenopathy or thryomegaly appreciated. Cor: PMI nondisplaced. Regular rate & rhythm. No rubs, gallops or murmurs. Lungs: coarse Abdomen: obesesoft, nontender,  nondistended. No hepatosplenomegaly. No bruits or masses. Good bowel sounds. Extremities: no cyanosis, clubbing, rash, 3+  Edema  Mild livido on R thumb Neuro: awake not following commands  Telemetry: sinus 110-120 Personally reviewed  Labs: Basic Metabolic Panel: Recent Labs  Lab 09/11/20 1615 09/11/20 1617 09/14/20 0352 09/14/20 0401 09/14/20 1645 09/14/20 1649 09/15/20 0312 09/15/20 0326 09/15/20 1616 09/15/20 1618 09/15/20 2050 09/16/20 0357 09/16/20 0405  NA 146*   < > 146*   < > 150*   < > 149*   < > 152* 149* 151* 148* 152*  K 3.9   < > 4.9   < > 5.2*   < > 4.8   < > 4.7 4.7 4.9 5.0 5.0  CL 111   < > 113*  --  116*  --  117*  --   --  120*  --  118*  --   CO2 25   < > 23  --  24  --  23  --   --  23  --  22  --   GLUCOSE 142*   < > 205*  --  155*  --  215*  --   --  175*  --  215*  --   BUN 51*   < > 73*  --  71*  --  82*  --   --  79*  --  84*  --   CREATININE 1.16   < >  1.46*  --  1.33*  --  1.55*  --   --  1.41*  --  1.35*  --   CALCIUM 7.7*   < > 7.9*  --  8.1*  --  8.0*  --   --  7.5*  --  7.8*  --   PHOS 3.4  --   --   --   --   --   --   --   --   --   --   --   --    < > = values in this interval not displayed.    Liver Function Tests: Recent Labs  Lab 09/12/20 0352 09/13/20 0304 09/14/20 0352 09/15/20 0312 09/16/20 0357  AST 52* 56* 38 44* 31  ALT 36 36 27 37 27  ALKPHOS 137* 153* 102 137* 73  BILITOT 2.2* 2.6* 2.2* 2.5* 2.4*  PROT 4.7* 5.1* 4.5* 4.5* 4.0*  ALBUMIN 1.9* 1.9* 3.1* 2.7* 3.0*   No results for input(s): LIPASE, AMYLASE in the last 168 hours. Recent Labs  Lab 09/14/20 0821  AMMONIA 48*    CBC: Recent Labs  Lab 09/14/20 1645 09/14/20 1649 09/15/20 0312 09/15/20 0326 09/15/20 1618 09/15/20 2050 09/15/20 2053 09/16/20 0357 09/16/20 0405  WBC 22.0*  --  21.7*  --  16.8*  --  17.5* 13.2*  --   HGB 9.9*   < > 9.2*   < > 8.6* 7.8* 8.4* 7.8* 7.1*  HCT 29.7*   < > 29.5*   < > 26.4* 23.0* 27.3* 25.0* 21.0*  MCV 96.4  --  98.7   --  97.1  --  97.8 98.4  --   PLT 153  --  150  --  131*  --  133* 122*  --    < > = values in this interval not displayed.    Cardiac Enzymes: No results for input(s): CKTOTAL, CKMB, CKMBINDEX, TROPONINI in the last 168 hours.  BNP: BNP (last 3 results) No results for input(s): BNP in the last 8760 hours.  ProBNP (last 3 results) No results for input(s): PROBNP in the last 8760 hours.    Other results:  Imaging: DG CHEST PORT 1 VIEW  Result Date: 09/15/2020 CLINICAL DATA:  COVID positive.  ECMO. EXAM: PORTABLE CHEST 1 VIEW COMPARISON:  09/14/2020 FINDINGS: Tracheostomy tube again noted. A feeding tube passes into the stomach although the distal tip position is not included on the film. Left IJ central line tip overlies the innominate vein confluence. ECMO cannula again noted. Diffuse bilateral airspace disease is similar to prior. Telemetry leads overlie the chest. IMPRESSION: 1. No substantial interval change in exam. 2. Diffuse bilateral airspace disease. Electronically Signed   By: Misty Stanley M.D.   On: 09/15/2020 08:13   DG CHEST PORT 1 VIEW  Result Date: 09/14/2020 CLINICAL DATA:  COVID-19 positivity on ECMO with acute respiratory distress EXAM: PORTABLE CHEST 1 VIEW COMPARISON:  09/14/2020 FINDINGS: Cardiac shadow is stable. Tracheostomy tube is now seen in satisfactory position. Feeding catheter and left jugular central line are again noted. ECMO cannula is seen in satisfactory position. Diffuse airspace opacity is noted in both lungs with air bronchograms similar to that seen on the prior exam. Bilateral pleural effusions are noted as well. No bony abnormality is seen. IMPRESSION: New tracheostomy tube in place. The remainder of the exam is stable from the prior study. Electronically Signed   By: Inez Catalina M.D.   On: 09/14/2020 12:35   [place] EKG SITE  RITE  Result Date: 09/15/2020 If Site Rite image not attached, placement could not be confirmed due to current cardiac  rhythm.    Medications:     Scheduled Medications: . amLODipine  10 mg Per Tube Daily  . aspirin  81 mg Per Tube Daily  . calcium gluconate  2 g Intravenous Once  . chlorhexidine gluconate (MEDLINE KIT)  15 mL Mouth Rinse BID  . Chlorhexidine Gluconate Cloth  6 each Topical Daily  . clonazePAM  2 mg Per Tube BID  . cloNIDine  0.2 mg Per Tube BID  . docusate  100 mg Per Tube BID  . feeding supplement (PROSource TF)  45 mL Per Tube TID  . fentaNYL (SUBLIMAZE) injection  200 mcg Intravenous Once  . fiber  1 packet Per Tube BID  . free water  200 mL Per Tube Q2H  . furosemide  20 mg Intravenous Once  . heparin sodium (porcine)  1,000 Units Intracatheter Once  . insulin aspart  10 Units Subcutaneous Q4H  . insulin aspart  2-6 Units Subcutaneous Q4H  . insulin detemir  24 Units Subcutaneous Q12H  . lactulose  20 g Per Tube BID  . levothyroxine  50 mcg Per Tube Q0600  . mouth rinse  15 mL Mouth Rinse 10 times per day  . methylPREDNISolone (SOLU-MEDROL) injection  60 mg Intravenous Q6H  . metoprolol tartrate  50 mg Per Tube TID  . midazolam  5 mg Intravenous Once  . oxyCODONE  15 mg Per Tube Q6H  . pantoprazole sodium  40 mg Per Tube Daily  . pravastatin  40 mg Per Tube q1800  . QUEtiapine  100 mg Per Tube BID  . sodium chloride flush  10-40 mL Intracatheter Q12H  . sodium chloride flush  10-40 mL Intracatheter Q12H    Infusions: . sodium chloride 10 mL/hr at 09/16/20 0700  . therapeutic plasma exchange solution    . albumin human Stopped (09/11/20 1900)  . bivalirudin (ANGIOMAX) infusion 0.5 mg/mL (Non-ACS indications) 0.187 mg/kg/hr (09/16/20 0700)  . calcium gluconate    . citrate dextrose    . citrate dextrose    . clevidipine Stopped (09/15/20 0936)  . dexmedetomidine (PRECEDEX) IV infusion Stopped (09/14/20 0805)  . dextrose    . feeding supplement (PIVOT 1.5 CAL) 70 mL/hr at 09/16/20 0700  . fentaNYL infusion INTRAVENOUS 400 mcg/hr (09/16/20 0700)  . meropenem  (MERREM) IV Stopped (09/16/20 0225)  . midazolam 10 mg/hr (09/16/20 0700)  . norepinephrine (LEVOPHED) Adult infusion Stopped (09/12/20 1530)  . propofol (DIPRIVAN) infusion Stopped (09/15/20 0936)  . vancomycin Stopped (09/16/20 0425)    PRN Medications: sodium chloride, acetaminophen (TYLENOL) oral liquid 160 mg/5 mL, acetaminophen, acetaminophen, albumin human, dextrose, diphenhydrAMINE, diphenhydrAMINE, fentaNYL, heparin, labetalol, LORazepam, ondansetron **OR** ondansetron (ZOFRAN) IV, polyethylene glycol, sodium chloride flush, sodium chloride flush   Assessment/Plan:   1. Acute hypoxic respiratory failure/ARDS in setting of COVID-19 PNA - Cannulated for VV ECMO on 12/8 after failing intubation x 2 days - has complete remedisivir, baricitinib, Solumedrol.  - s/p trach 12/16 - CXR continues with diffuse infiltrates. - evidence of worsening acidosis/infection 12/17 mero added - staph in tracheal aspirate -  vanc added 12/14 mero added 12/17 - circuit changed 12/13 for elevated LDH and lower flows. PLEX started 12/15.  LDH 1,290 -> (PLEX) - > 685 -> 916 -> (PLEX) -> 502, Today is PLEX #3. Continue PLEX  qOD for now - PTT 64 will hold bival for 4 hours in setting of  trach bleeding. Keep PTT close to 50  Discussed dosing with PharmD personally. personally. - Driving pressure ok - volume status way up. Repeat lasix. May need CVVHD via PLEX line  2. Bilateral DVT - remains on bival - Dosing d/w PharmD  3. DM2 - on insulin gtt. CBGs high. Increasing today  4. Obesity -Body mass index is 33.73 kg/m.  5. F/E/N - Na 147 -> 153 -> 146 -> 149 -> 148 - Continue free water  6. HTN - Remains high  - Increase clonidine & metoprolol - Wean cleveprex  7. Dusky R thumb - slightly improved  8. AKI - creatinine improved today. BUN climbing - markedly volume overloaded. - repeat lasix 20IV x 1.  - can do CVVHD via vas cath line as needed  9. Constipation - improved  Plan  discussed on multi-discplinary ECMO rounds with CCM, ECMO coordinator/specialist, PharmDs and RNs.  CRITICAL CARE Performed by: Glori Bickers  CRITICAL CARE Performed by: Glori Bickers  Total critical care time: 45 minutes  Critical care time was exclusive of separately billable procedures and treating other patients.  Critical care was necessary to treat or prevent imminent or life-threatening deterioration.  Critical care was time spent personally by me (independent of midlevel providers or residents) on the following activities: development of treatment plan with patient and/or surrogate as well as nursing, discussions with consultants, evaluation of patient's response to treatment, examination of patient, obtaining history from patient or surrogate, ordering and performing treatments and interventions, ordering and review of laboratory studies, ordering and review of radiographic studies, pulse oximetry and re-evaluation of patient's condition.    Length of Stay: 14   Nathanuel Cabreja MD 09/16/2020, 8:10 AM  Advanced Heart Failure Team Pager [phone] (M-F; [place])  Please contact [facility] for night-coverage after hours (4p -7a ) and weekends on [email or web address]

## 2020-09-16 NOTE — Procedures (Signed)
Extracorporeal support note     ECLS cannulation: 09/05/2020 Last circuit change: 09/11/2020 Indication: COVD ARDS   Configuration: VV, RIJ 32Fr Crescent   Pump speed: 4000 Pump flow: 5.0 Pump used: Cardiohelp   Sweep gas: 100%, 13LPM   Circuit check: no clots on visual inspection Anticoagulant: bivalirudin Anticoagulation targets: PTT 50-55  after holding for 4 hrs this morning   Changes in support: Received Plax yesterday; receiving again today. Increasing antihypertensives. Continue current support.  Minimize sedation as able- cough is limiting.  Con't antibiotics. Follow cultures.   Anticipated goals/duration of support: bridge to recovery   Discussed care with ECMO team, Palliative Care, RN.  Steffanie Dunn, DO 09/16/20 8:40 AM [place]

## 2020-09-16 NOTE — Progress Notes (Signed)
Patient ID: Donald Rowe, male   DOB: 09-05-1961, 59 y.o.   MRN: [id number] [facility] Progress Note   Assessment/ Plan:   1.  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    Nonoliguric and with improving creatinine--BUN higher due to multiple reasons including ongoing diuresis, solumedrol and TFs. 2.  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  3.  Acute hypoxic respiratory failure: Secondary to COVID-19 infection, on VV ECMO and attempting strategies to optimize ventilation and oxygenation. 4.  Bilateral lower extremity DVTs: Secondary to prothrombotic state in COVID-19 infection.  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 5.  Healthcare associated pneumonia/MRSA tracheitis: Continue on intravenous vancomycin.  Subjective:   With hypotension on TPE today.    Objective:   BP (!) 206/68   Pulse (!) 117   Temp 98.06 F (36.7 C)   Resp 15   Ht _0  (1.753 m)   Wt 122.5 kg   SpO2 (!) 89%   BMI 39.88 kg/m   Intake/Output Summary (Last 24 hours) at 09/16/2020 0929 Last data filed at 09/16/2020 0900 Gross per 24 hour  Intake 5611.32 ml  Output 4175 ml  Net 1436.32 ml   Weight change: 3.1 kg  Physical Exam: Gen: Intubated, sedated, on plasmapheresis CVS: Pulse regular rhythm, normal rate, S1 and S2 normal.  Right IJ ECMO. Resp: Anteriorly coarse breath sounds bilaterally, no distinct rales/wheeze Abd: Soft, obese, nontender, bowel sounds normal Ext: 1-2+ anasarca  Imaging: DG CHEST PORT 1 VIEW  Result Date: 09/15/2020 CLINICAL DATA:  COVID positive.  ECMO. EXAM: PORTABLE CHEST 1 VIEW COMPARISON:  09/14/2020 FINDINGS: Tracheostomy tube again noted. A feeding tube passes into the stomach although the distal tip position is  not included on the film. Left IJ central line tip overlies the innominate vein confluence. ECMO cannula again noted. Diffuse bilateral airspace disease is similar to prior. Telemetry leads overlie the chest. IMPRESSION: 1. No substantial interval change in exam. 2. Diffuse bilateral airspace disease. Electronically Signed   By: Misty Stanley M.D.   On: 09/15/2020 08:13   DG CHEST PORT 1 VIEW  Result Date: 09/14/2020 CLINICAL DATA:  COVID-19 positivity on ECMO with acute respiratory distress EXAM: PORTABLE CHEST 1 VIEW COMPARISON:  09/14/2020 FINDINGS: Cardiac shadow is stable. Tracheostomy tube is now seen in satisfactory position. Feeding catheter and left jugular central line are again noted. ECMO cannula is seen in satisfactory position. Diffuse airspace opacity is noted in both lungs with air bronchograms similar to that seen on the prior exam. Bilateral pleural effusions are noted as well. No bony abnormality is seen. IMPRESSION: New tracheostomy tube in place. The remainder of the exam is stable from the prior study. Electronically Signed   By: Inez Catalina M.D.   On: 09/14/2020 12:35   [place] EKG SITE RITE  Result Date: 09/15/2020 If Site Rite image not attached, placement could not be confirmed due to current cardiac rhythm.   Labs: BMET Recent Labs  Lab 09/11/20 1615 09/11/20 1617 09/13/20 1700 09/13/20 2016 09/13/20 2100 09/14/20 0352 09/14/20 0401 09/14/20 1645 09/14/20 1649 09/15/20 0312 09/15/20 0326 09/15/20 0623 09/15/20 1133 09/15/20 1616 09/15/20 1618 09/15/20 2050 09/16/20 0357 09/16/20 0405  NA 146*   < > 145   < > 144 146*   < > 150*   < > 149*   < >  149* 150* 152* 149* 151* 148* 152*  K 3.9   < > 4.8   < > 4.8 4.9   < > 5.2*   < > 4.8   < > 4.7 4.7 4.7 4.7 4.9 5.0 5.0  CL 111   < > 112*  --  112* 113*  --  116*  --  117*  --   --   --   --  120*  --  118*  --   CO2 25   < > 25  --  26 23  --  24  --  23  --   --   --   --  23  --  22  --   GLUCOSE 142*   < >  220*  --  237* 205*  --  155*  --  215*  --   --   --   --  175*  --  215*  --   BUN 51*   < > 77*  --  80* 73*  --  71*  --  82*  --   --   --   --  79*  --  84*  --   CREATININE 1.16   < > 1.57*  --  1.56* 1.46*  --  1.33*  --  1.55*  --   --   --   --  1.41*  --  1.35*  --   CALCIUM 7.7*   < > 8.0*  --  8.1* 7.9*  --  8.1*  --  8.0*  --   --   --   --  7.5*  --  7.8*  --   PHOS 3.4  --   --   --   --   --   --   --   --   --   --   --   --   --   --   --   --   --    < > = values in this interval not displayed.   CBC Recent Labs  Lab 09/15/20 0312 09/15/20 0326 09/15/20 1618 09/15/20 2050 09/15/20 2053 09/16/20 0357 09/16/20 0405  WBC 21.7*  --  16.8*  --  17.5* 13.2*  --   HGB 9.2*   < > 8.6* 7.8* 8.4* 7.8* 7.1*  HCT 29.5*   < > 26.4* 23.0* 27.3* 25.0* 21.0*  MCV 98.7  --  97.1  --  97.8 98.4  --   PLT 150  --  131*  --  133* 122*  --    < > = values in this interval not displayed.    Medications:    . amLODipine  10 mg Per Tube Daily  . aspirin  81 mg Per Tube Daily  . calcium gluconate  2 g Intravenous Once  . chlorhexidine gluconate (MEDLINE KIT)  15 mL Mouth Rinse BID  . Chlorhexidine Gluconate Cloth  6 each Topical Daily  . clonazePAM  2 mg Per Tube BID  . cloNIDine  0.2 mg Per Tube BID  . docusate  100 mg Per Tube BID  . feeding supplement (PROSource TF)  45 mL Per Tube TID  . fentaNYL (SUBLIMAZE) injection  200 mcg Intravenous Once  . fiber  1 packet Per Tube BID  . free water  200 mL Per Tube Q2H  . heparin sodium (porcine)  1,000 Units Intracatheter Once  . insulin aspart  10 Units Subcutaneous Q4H  . insulin  aspart  2-6 Units Subcutaneous Q4H  . insulin detemir  36 Units Subcutaneous Q12H  . lactulose  20 g Per Tube BID  . levothyroxine  50 mcg Per Tube Q0600  . mouth rinse  15 mL Mouth Rinse 10 times per day  . methylPREDNISolone (SOLU-MEDROL) injection  60 mg Intravenous Q6H  . metoprolol tartrate  50 mg Per Tube Q8H  . midazolam  5 mg Intravenous Once   . oxyCODONE  15 mg Per Tube Q6H  . pantoprazole sodium  40 mg Per Tube Daily  . pravastatin  40 mg Per Tube q1800  . QUEtiapine  100 mg Per Tube BID  . sodium chloride flush  10-40 mL Intracatheter Q12H  . sodium chloride flush  10-40 mL Intracatheter Q12H   Elmarie Shiley, MD 09/16/2020, 9:29 AM

## 2020-09-16 NOTE — Progress Notes (Signed)
NAME:  Donald Rowe, MRN:  [id number], DOB:  [birth year], LOS: 71 ADMISSION DATE:  09/04/2020, CONSULTATION DATE:  12/6 REFERRING MD:  Dr. Aileen Fass, CHIEF COMPLAINT:  SOB    Brief History   59 y/o M admitted 12/4 with 1 week hx of SOB, known COVID positive.  He is unvaccinated.  CTA chest negative for PE but demonstrated diffuse bilateral infiltrates.   History of present illness   59 y/o unvaccinated male, admitted 12/4 with a one week history of shortness of breath.    The patient began feeling poorly the day before Thanksgiving with fever, weakness and body aches. He tested positive for COVID on 12/27 prior to presentation.  He developed progressive shortness of breath.  In the ER, the patient was found to have saturations of 78%.  He was started on 6L O2 with initial improvement in saturations.  The patient was admitted per [facility]. CTA of the Chest was assessed and negative for pulmonary embolism but showed diffuse bilateral infiltrates. He was treated with IV remdesivir, steroids and baricitinib.  Initially, he was also treated with antibiotics but these were stopped on 12/6 with a low procalcitonin.  LE doppler negative for DVT. The patient elected to be DNR / no intubation.  He developed worsening respiratory failure with hypoxia requiring 60L flow 100% and PCCM consulted 12/6 for evaluation.   Past Medical History  Hypothyroidism  HLD   Significant Hospital Events/Procedures  12/04 Admit  12/06 PCCM consulted  12/07 Intubated, central line, a line 12/08 VV ECMO Cannulaton, cortrak 12/13 circuit change for hemolytic anemia 12/16 Tracheostomy  12/17 bronch w/ BAL  Consults:  PCCM, Heart failure, TCTS, ECMO  Significant Diagnostic Tests:   CTA Chest 12/4 >> extensive bilateral airspace disease, no large PE identified, limited study   LE Venous Duplex 12/4 >> negative for DVT bilaterally   LE venous duplex 12/8> BLE DVTs  Micro Data:  COVID 12/4 >> negative  Influenza A/B  12/4 >> negative  MRSA PCR 12/5 >> negative  BCx2 12/4 >> NG Trach aspirate 12/12> rare MRSA BAL 12/17> abundant WBC Aspergillus Ag BAL 12/17>  Antimicrobials:  Azithromycin 12/5 >> 12/6  Ceftriaxone 12/5 >> 12/6  Cefepime 12/7>12/14 vanc 12/14> Meropenem 12/17>   Interim history/subjective:  1 unit pRBCs overnight.   Objective   Blood pressure (!) 188/55, pulse (!) 102, temperature 98.24 F (36.8 C), resp. rate 11, height _0  (1.753 m), weight 122.5 kg, SpO2 93 %.    Vent Mode: PCV FiO2 (%):  [80 %-100 %] 80 % Set Rate:  [12 bmp] 12 bmp PEEP:  [10 cmH20] 10 cmH20 Plateau Pressure:  [15 cmH20-27 cmH20] 27 cmH20   Intake/Output Summary (Last 24 hours) at 09/16/2020 0631 Last data filed at 09/16/2020 0600 Gross per 24 hour  Intake 5680.4 ml  Output 3950 ml  Net 1730.4 ml   Filed Weights   09/14/20 0500 09/15/20 0100 09/16/20 0030  Weight: 119.5 kg 119.4 kg 122.5 kg    Examination: Constitutional: critically ill appearing man laying in bed in NAD, sedated on MV, ECMO Eyes: opens spontaneously, icteric sclera Ears, nose, mouth, and throat: oral mucosa dry Cardiovascular: tachycardic, regular rhythm, no murmurs Respiratory: rhales bilaterally, tachypneic, Vt ~240cc on MV. Bloody secretions from trach. Gastrointestinal: obese, soft, NT Skin: pallor, patchy cyanosis with delayed cap refill on right hand. No erythema around R fem dialysis catheter, LIJ CVC, RUE PICC Neurologic: opens eyes, not tracking, moving arms spontaneously Psychiatric: RASS -3   Resolved Hospital  Problem list      Assessment & Plan:   Acute hypoxemic and hypercarbic respiratory failure secondary to COVID PNA with severe ARDS. S/p VV ECMO cannulation 08/30/2020.  Tracheostomy 90/30 complicated by trach site bleeding HAP -Completed remdesivir.  Completed baricitinib.  Completed CAP coverage. -con't full ECMO support -con't ultralung protective ventilation -con't solumedrol -routine trach  care -VAP prevention protocol -con't meropenem and vanc -con't plex -diuresis needed  Bleeding around trach -hold bival 4 hours, restart with PTT goal ~50 -injected with lidocaine with 1% epi again this morning  HTN -increase metoprolol to 67m TID -increase clonidine to 0.236mBID  Agitated delirium requiring titration of IV sedation -PAD protocol; goal RASS -1 and tolerance of MV & ECMO -con't seroquel -con't enteral oxycodone and clonazepam  Bilateral lower ext DVTs- provoked by COVID infection -on bivaliriduin  Steroid-induced hyperglycemia- uncontrolled -increase levemir to 36 units BID -con't TF coverage and SSI PRN  Hypothyroidism -con't PTA synthroid   HLD -con't stat  Hemolysis causing hyperbilirubinemia- ongoing, circuit changed, has plateaued somewhat. Potentially due to uncontrolled sepsis.  Rheumatologic-associated MAHA remains in differential.   -con't PLEX- treatment #4 today -recheck ionized calcium later today  Hypervolemia Hypernatremia and hyperchloremia -lasix 2045maily -strict I/Os -holding free water at 200cc Q2h until hypervolemia resolves  Acute anemia & thrombocytopenia- due to hemolysis, acute illness -con't to monitor -transfuse for Hb<8; 1 unit pRBCs this AM  Plan discussed at bedside with palliative care, cardiology, ECMO specialist, pahrmacy, RN.  Best practice (evaluated daily)  Diet: TF Pain/Anxiety/Delirium protocol (if indicated): as above VAP protocol (if indicated): yes DVT prophylaxis: Bivalirudin GI prophylaxis: pantoprazole  Glucose control: Subcutaneous insulin basal bolus with good control   Mobility: bedrest Last date of multidisciplinary goals of care discussion: wife updated over the phone 12/15 Family and staff present RN, physicain Summary of discussion con't aggressive care Follow up goals of care discussion due: 12/22 Code Status: full Disposition: ICU  This patient is critically ill with multiple organ  system failure which requires frequent high complexity decision making, assessment, support, evaluation, and titration of therapies. This was completed through the application of advanced monitoring technologies and extensive interpretation of multiple databases. During this encounter critical care time was devoted to patient care services described in this note for 56 minutes.  LauJulian HyO 09/16/20 8:37 AM [facility]

## 2020-09-16 NOTE — Progress Notes (Addendum)
ANTICOAGULATION CONSULT NOTE  Pharmacy Consult for bivalirudin Indication: ECMO + bilateral DVTs   Recent Labs    09/14/20 0352 09/14/20 0401 09/15/20 0312 09/15/20 0326 09/15/20 1618 09/15/20 2050 09/15/20 2053 09/16/20 0357 09/16/20 0405 09/16/20 1145 09/16/20 1223 09/16/20 1615  HGB 9.6*   < > 9.2*   < > 8.6*   < > 8.4* 7.8* 7.1*  --  7.5* 8.2*  HCT 28.2*   < > 29.5*   < > 26.4*   < > 27.3* 25.0* 21.0*  --  22.0* 26.7*  PLT 135*   < > 150  --  131*  --  133* 122*  --   --   --  122*  APTT 65*   < > 55*   < > 83*  --  70* 64*  --   --   --  76*  LABPROT 30.1*  --  25.7*  --   --   --   --  32.9*  --   --   --   --   INR 3.0*  --  2.4*  --   --   --   --  3.4*  --   --   --   --   CREATININE 1.46*   < > 1.55*  --  1.41*  --   --  1.35*  --  1.26*  --   --    < > = values in this interval not displayed.    Estimated Creatinine Clearance: 81.6 mL/min (A) (by C-G formula based on SCr of 1.26 mg/dL (H)).   Assessment: 14 yom unvaccinated presenting with severe COVID ARDs, intubated on 12/6, continued to have ventilation issues despite NMB and proning - started on VV ECMO on 12/8.  D-dimer >20 > now improved 14,  found to have bilateral DVTs.  Continues to bleed from trach site. ECMO circuit stable. After discussion with ECMO team, will hold bivalirudin for 4 hours and restart at lower rate to target lower aPTT goal of 50-55 seconds.  Repeat aPTT is above goal at 76 seconds, bleeding resolved while bivalirudin was held but has now returned. Afternoon H/H up.  Goal of Therapy:  aPTT 50-55 seconds Monitor platelets by anticoagulation protocol: Yes   Plan:  Reduce bivalirudin to 0.145 mg/kg/hr (dosing wt 103.6 kg) Recheck aPTT in 4h  ADDENDUM: Repeat aPTT remains above goal but trending down at 66 seconds.  Plan: Reduce bivalirudin to 0.13mg /kg/h Recheck with am labs  Fredonia Highland, PharmD, [place], [facility] Clinical Pharmacist [phone] Please check AMION for all [facility]  Pharmacy numbers 09/16/2020

## 2020-09-16 NOTE — Progress Notes (Signed)
ANTICOAGULATION CONSULT NOTE  Pharmacy Consult for bivalirudin Indication: ECMO + bilateral DVTs   Recent Labs    09/14/20 0352 09/14/20 0401 09/15/20 0312 09/15/20 0326 09/15/20 1618 09/15/20 2050 09/15/20 2053 09/16/20 0357 09/16/20 0405  HGB 9.6*   < > 9.2*   < > 8.6*   < > 8.4* 7.8* 7.1*  HCT 28.2*   < > 29.5*   < > 26.4*   < > 27.3* 25.0* 21.0*  PLT 135*   < > 150  --  131*  --  133* 122*  --   APTT 65*   < > 55*   < > 83*  --  70* 64*  --   LABPROT 30.1*  --  25.7*  --   --   --   --  32.9*  --   INR 3.0*  --  2.4*  --   --   --   --  3.4*  --   CREATININE 1.46*   < > 1.55*  --  1.41*  --   --  1.35*  --    < > = values in this interval not displayed.    Estimated Creatinine Clearance: 76.2 mL/min (A) (by C-G formula based on SCr of 1.35 mg/dL (H)).   Assessment: 82 yom unvaccinated presenting with severe COVID ARDs, intubated on 12/6, continued to have ventilation issues despite NMB and proning - started on VV ECMO on 12/8.  D-dimer >20 > now improved 14,  found to have bilateral DVTs.   APTT therapeutic at 64 on bivalirudin@0 .187 mg/kg/hr. Hgb 7.8, plt 122. Fibrinogen 114. LDH down to 502 today. Continues to bleed from trach site. ECMO circuit stable. After discussion with ECMO team, will hold bivalirudin for 4 hours and restart at lower rate to target lower aPTT goal of 50-55 seconds.  Goal of Therapy:  aPTT 50-55 seconds Monitor platelets by anticoagulation protocol: Yes   Plan:  Hold bivalirudin for 4 hours Restart bivalirudin at 0.17 mg/kg/hr at 1230 (dosing wt 103.6 kg) Will continue bivalirudin at 0.187 mg/kg/h (dosing wt 103kg) Check 1700 aPTT  Monitor CBC, s/sx bleeding  Tama Headings, PharmD, BCPS PGY2 Cardiology Pharmacy Resident Phone: [phone] 09/16/2020  7:17 AM  Please check [email or web address] for unit-specific pharmacy phone numbers.

## 2020-09-16 NOTE — Progress Notes (Addendum)
Patient's daughter Donald Rowe called at 1800 requesting an update. She would like to receive a call from a doctor regarding her father's positive Covid status and progress.  Microbiology called a positive on the Covid test administered this morning.

## 2020-09-16 NOTE — Progress Notes (Signed)
Palliative Medicine Inpatient Follow Up Note  Reason for consult:  "ECMO"  HPI:  Per intake H&P --> Donald Rowe a 59 y.o.malewith a pertinent history ofhypothyroidism on levothyroxine and hyperlipidemia who was diagnosed with Covid 1 week ago and is not vaccinated. With EMS he was seen to be desaturating to 78% on room air. He states he started having symptoms on Friday and tested positive on Saturday.  Had initially elected to be DNAR without intubation though later changed this and got intubated on 12/6  in the setting of worsening respiratory failure with hypoxia. CTA chest w/ diffuse bilateral infiltrates. On 12/8 was cannulated for VV ECMO. Had been extubated on 12/9 then emergently reintubated in early evening due to clinical instability.   Palliative care was asked to get involved in the setting of VV ECMO to set goals and expectations with Bodee's family.  Today's Discussion (09/16/2020): Chart reviewed.  Attended ECMO rounds this morning with Dr. Chestine Spore, Dr. Gala Romney, Canary Brim RN, and Fannie Knee RRT. Patient is receiving her third plasmapheresis treatment with good result. Abx were broadened yesterday w/ improvement in acidosis. Continues to be volume up - receiving lasix - possible initiation of CRRT moving forward. Continues to have tracheal site bleeding which Dr. Chestine Spore provided x2 lidocaine w/ 1% epinephrine injections, (+) 1 unit of blood yesterday, angiomax on hold for short period. There is some right thumb mottling which the medical team is keeping a close eye on. Plan to send a covid swab this morning to see if isolation can be lifted.   Patients wife, [name] provided an update this morning. I shared with her the above information. She and I discussed that Tremain is critically ill and remains to have a long road ahead. I shared in her optimism for the future. She is anxious to get a better understanding as to when patient can get off of isolation she she and her family  members can come and spend time with him. Allowed time for [name] to express any questions that she had.   Informed nursing staff that if isolation can be lifted today patients daughter, Elmarie Shiley would appreciate a call so that she may visit.   Discussed the importance of continued conversation with family and their  medical providers regarding overall plan of care and treatment options, ensuring decisions are within the context of the patients values and GOCs.  Questions and concerns addressed   Objective Assessment: Vital Signs Vitals:   09/16/20 1016 09/16/20 1034  BP:    Pulse: (!) 102 100  Resp: (!) 7 (!) 8  Temp: 97.7 F (36.5 C) 97.7 F (36.5 C)  SpO2: 90% (!) 89%    Intake/Output Summary (Last 24 hours) at 09/16/2020 1038 Last data filed at 09/16/2020 1016 Gross per 24 hour  Intake 5346 ml  Output 4150 ml  Net 1196 ml   Last Weight  Most recent update: 09/16/2020  4:36 AM   Weight  122.5 kg (270 lb 1 oz)           Gen:  Middle age caucasian M intubated and sedated HEENT: trach in place, dry mucous membranes CV: Regular rate and rhythm  PULM: on vent support Neuro: Open eyes to name being called and chest stimulation  SUMMARY OF RECOMMENDATIONS Full Code/Full Scope of Care  ECMO as a bridge to recovery  Spiritual support appreciated  Provided patients wife, [name] a daily update  Ongoing PMT support - Plan to attend daily rounds with the medical teams  Time  Spent: 25 Greater than 50% of the time was spent in counseling and coordination of care ______________________________________________________________________________________ [name] [facility] Palliative Medicine Team Team Cell Phone: [phone] Please utilize secure chat with additional questions, if there is no response within 30 minutes please call the above phone number  Palliative Medicine Team providers are available by phone from 7am to 7pm daily and can be reached through the  team cell phone.  Should this patient require assistance outside of these hours, please call the patient's attending physician.

## 2020-09-16 NOTE — Progress Notes (Signed)
Approximately 0930 patient's BP dropped and MAP decreased to high 40's. Apharesis paused. Dr. Gala Romney ordered Neo drip. At 0945 Dr. Allena Katz ordered albumin. BP and MAP recovered to 71-72 at 1015, apharesis restarted.

## 2020-09-17 ENCOUNTER — Inpatient Hospital Stay (HOSPITAL_COMMUNITY): Payer: HRSA Program

## 2020-09-17 DIAGNOSIS — J9602 Acute respiratory failure with hypercapnia: Secondary | ICD-10-CM | POA: Diagnosis not present

## 2020-09-17 DIAGNOSIS — Z7189 Other specified counseling: Secondary | ICD-10-CM | POA: Diagnosis not present

## 2020-09-17 DIAGNOSIS — Z9911 Dependence on respirator [ventilator] status: Secondary | ICD-10-CM | POA: Diagnosis not present

## 2020-09-17 DIAGNOSIS — E870 Hyperosmolality and hypernatremia: Secondary | ICD-10-CM | POA: Diagnosis not present

## 2020-09-17 DIAGNOSIS — E8770 Fluid overload, unspecified: Secondary | ICD-10-CM | POA: Diagnosis not present

## 2020-09-17 DIAGNOSIS — Z9281 Personal history of extracorporeal membrane oxygenation (ECMO): Secondary | ICD-10-CM | POA: Diagnosis not present

## 2020-09-17 DIAGNOSIS — U071 COVID-19: Secondary | ICD-10-CM | POA: Diagnosis not present

## 2020-09-17 DIAGNOSIS — Z515 Encounter for palliative care: Secondary | ICD-10-CM | POA: Diagnosis not present

## 2020-09-17 DIAGNOSIS — J9601 Acute respiratory failure with hypoxia: Secondary | ICD-10-CM | POA: Diagnosis not present

## 2020-09-17 LAB — HEPATIC FUNCTION PANEL
ALT: 39 U/L (ref 0–44)
AST: 40 U/L (ref 15–41)
Albumin: 3.3 g/dL — ABNORMAL LOW (ref 3.5–5.0)
Alkaline Phosphatase: 70 U/L (ref 38–126)
Bilirubin, Direct: 1.4 mg/dL — ABNORMAL HIGH (ref 0.0–0.2)
Indirect Bilirubin: 1.4 mg/dL — ABNORMAL HIGH (ref 0.3–0.9)
Total Bilirubin: 2.8 mg/dL — ABNORMAL HIGH (ref 0.3–1.2)
Total Protein: 4.2 g/dL — ABNORMAL LOW (ref 6.5–8.1)

## 2020-09-17 LAB — PREPARE RBC (CROSSMATCH)

## 2020-09-17 LAB — POCT I-STAT 7, (LYTES, BLD GAS, ICA,H+H)
Acid-base deficit: 5 mmol/L — ABNORMAL HIGH (ref 0.0–2.0)
Acid-base deficit: 5 mmol/L — ABNORMAL HIGH (ref 0.0–2.0)
Acid-base deficit: 5 mmol/L — ABNORMAL HIGH (ref 0.0–2.0)
Acid-base deficit: 4 mmol/L — ABNORMAL HIGH (ref 0.0–2.0)
Acid-base deficit: 3 mmol/L — ABNORMAL HIGH (ref 0.0–2.0)
Acid-base deficit: 1 mmol/L (ref 0.0–2.0)
Bicarbonate: 21.3 mmol/L (ref 20.0–28.0)
Bicarbonate: 21 mmol/L (ref 20.0–28.0)
Bicarbonate: 20.5 mmol/L (ref 20.0–28.0)
Bicarbonate: 21 mmol/L (ref 20.0–28.0)
Bicarbonate: 22.3 mmol/L (ref 20.0–28.0)
Bicarbonate: 24.5 mmol/L (ref 20.0–28.0)
Calcium, Ion: 1.25 mmol/L (ref 1.15–1.40)
Calcium, Ion: 1.24 mmol/L (ref 1.15–1.40)
Calcium, Ion: 1.23 mmol/L (ref 1.15–1.40)
Calcium, Ion: 1.23 mmol/L (ref 1.15–1.40)
Calcium, Ion: 1.25 mmol/L (ref 1.15–1.40)
Calcium, Ion: 1.22 mmol/L (ref 1.15–1.40)
HCT: 23 % — ABNORMAL LOW (ref 39.0–52.0)
HCT: 21 % — ABNORMAL LOW (ref 39.0–52.0)
HCT: 24 % — ABNORMAL LOW (ref 39.0–52.0)
HCT: 22 % — ABNORMAL LOW (ref 39.0–52.0)
HCT: 21 % — ABNORMAL LOW (ref 39.0–52.0)
HCT: 23 % — ABNORMAL LOW (ref 39.0–52.0)
Hemoglobin: 7.8 g/dL — ABNORMAL LOW (ref 13.0–17.0)
Hemoglobin: 7.1 g/dL — ABNORMAL LOW (ref 13.0–17.0)
Hemoglobin: 8.2 g/dL — ABNORMAL LOW (ref 13.0–17.0)
Hemoglobin: 7.5 g/dL — ABNORMAL LOW (ref 13.0–17.0)
Hemoglobin: 7.1 g/dL — ABNORMAL LOW (ref 13.0–17.0)
Hemoglobin: 7.8 g/dL — ABNORMAL LOW (ref 13.0–17.0)
O2 Saturation: 88 %
O2 Saturation: 88 %
O2 Saturation: 81 %
O2 Saturation: 85 %
O2 Saturation: 90 %
O2 Saturation: 91 %
Patient temperature: 36.8
Patient temperature: 36.6
Patient temperature: 36.6
Patient temperature: 36.6
Patient temperature: 36.6
Patient temperature: 36.7
Potassium: 5.2 mmol/L — ABNORMAL HIGH (ref 3.5–5.1)
Potassium: 5.1 mmol/L (ref 3.5–5.1)
Potassium: 5 mmol/L (ref 3.5–5.1)
Potassium: 5 mmol/L (ref 3.5–5.1)
Potassium: 5 mmol/L (ref 3.5–5.1)
Potassium: 5.1 mmol/L (ref 3.5–5.1)
Sodium: 152 mmol/L — ABNORMAL HIGH (ref 135–145)
Sodium: 154 mmol/L — ABNORMAL HIGH (ref 135–145)
Sodium: 152 mmol/L — ABNORMAL HIGH (ref 135–145)
Sodium: 151 mmol/L — ABNORMAL HIGH (ref 135–145)
Sodium: 150 mmol/L — ABNORMAL HIGH (ref 135–145)
Sodium: 150 mmol/L — ABNORMAL HIGH (ref 135–145)
TCO2: 23 mmol/L (ref 22–32)
TCO2: 22 mmol/L (ref 22–32)
TCO2: 22 mmol/L (ref 22–32)
TCO2: 22 mmol/L (ref 22–32)
TCO2: 24 mmol/L (ref 22–32)
TCO2: 26 mmol/L (ref 22–32)
pCO2 arterial: 44.4 mmHg (ref 32.0–48.0)
pCO2 arterial: 41.3 mmHg (ref 32.0–48.0)
pCO2 arterial: 37.5 mmHg (ref 32.0–48.0)
pCO2 arterial: 37.7 mmHg (ref 32.0–48.0)
pCO2 arterial: 40.4 mmHg (ref 32.0–48.0)
pCO2 arterial: 42.2 mmHg (ref 32.0–48.0)
pH, Arterial: 7.288 — ABNORMAL LOW (ref 7.350–7.450)
pH, Arterial: 7.312 — ABNORMAL LOW (ref 7.350–7.450)
pH, Arterial: 7.345 — ABNORMAL LOW (ref 7.350–7.450)
pH, Arterial: 7.352 (ref 7.350–7.450)
pH, Arterial: 7.348 — ABNORMAL LOW (ref 7.350–7.450)
pH, Arterial: 7.37 (ref 7.350–7.450)
pO2, Arterial: 60 mmHg — ABNORMAL LOW (ref 83.0–108.0)
pO2, Arterial: 58 mmHg — ABNORMAL LOW (ref 83.0–108.0)
pO2, Arterial: 47 mmHg — ABNORMAL LOW (ref 83.0–108.0)
pO2, Arterial: 50 mmHg — ABNORMAL LOW (ref 83.0–108.0)
pO2, Arterial: 61 mmHg — ABNORMAL LOW (ref 83.0–108.0)
pO2, Arterial: 61 mmHg — ABNORMAL LOW (ref 83.0–108.0)

## 2020-09-17 LAB — CBC
HCT: 28.1 % — ABNORMAL LOW (ref 39.0–52.0)
HCT: 25.5 % — ABNORMAL LOW (ref 39.0–52.0)
HCT: 23.6 % — ABNORMAL LOW (ref 39.0–52.0)
Hemoglobin: 8.8 g/dL — ABNORMAL LOW (ref 13.0–17.0)
Hemoglobin: 8.2 g/dL — ABNORMAL LOW (ref 13.0–17.0)
Hemoglobin: 7.5 g/dL — ABNORMAL LOW (ref 13.0–17.0)
MCH: 30.7 pg (ref 26.0–34.0)
MCH: 30.9 pg (ref 26.0–34.0)
MCH: 30.7 pg (ref 26.0–34.0)
MCHC: 31.3 g/dL (ref 30.0–36.0)
MCHC: 32.2 g/dL (ref 30.0–36.0)
MCHC: 31.8 g/dL (ref 30.0–36.0)
MCV: 97.9 fL (ref 80.0–100.0)
MCV: 96.2 fL (ref 80.0–100.0)
MCV: 96.7 fL (ref 80.0–100.0)
Platelets: 123 10*3/uL — ABNORMAL LOW (ref 150–400)
Platelets: 122 10*3/uL — ABNORMAL LOW (ref 150–400)
Platelets: 112 10*3/uL — ABNORMAL LOW (ref 150–400)
RBC: 2.87 MIL/uL — ABNORMAL LOW (ref 4.22–5.81)
RBC: 2.65 MIL/uL — ABNORMAL LOW (ref 4.22–5.81)
RBC: 2.44 MIL/uL — ABNORMAL LOW (ref 4.22–5.81)
RDW: 18.6 % — ABNORMAL HIGH (ref 11.5–15.5)
RDW: 18.8 % — ABNORMAL HIGH (ref 11.5–15.5)
RDW: 18.7 % — ABNORMAL HIGH (ref 11.5–15.5)
WBC: 14.8 10*3/uL — ABNORMAL HIGH (ref 4.0–10.5)
WBC: 12.9 10*3/uL — ABNORMAL HIGH (ref 4.0–10.5)
WBC: 11.9 10*3/uL — ABNORMAL HIGH (ref 4.0–10.5)
nRBC: 1.1 % — ABNORMAL HIGH (ref 0.0–0.2)
nRBC: 0.9 % — ABNORMAL HIGH (ref 0.0–0.2)
nRBC: 1.1 % — ABNORMAL HIGH (ref 0.0–0.2)

## 2020-09-17 LAB — FIBRINOGEN: Fibrinogen: 74 mg/dL — CL (ref 210–475)

## 2020-09-17 LAB — BASIC METABOLIC PANEL
Anion gap: 6 (ref 5–15)
Anion gap: 7 (ref 5–15)
BUN: 87 mg/dL — ABNORMAL HIGH (ref 6–20)
BUN: 92 mg/dL — ABNORMAL HIGH (ref 6–20)
CO2: 21 mmol/L — ABNORMAL LOW (ref 22–32)
CO2: 22 mmol/L (ref 22–32)
Calcium: 7.7 mg/dL — ABNORMAL LOW (ref 8.9–10.3)
Calcium: 7.7 mg/dL — ABNORMAL LOW (ref 8.9–10.3)
Chloride: 121 mmol/L — ABNORMAL HIGH (ref 98–111)
Chloride: 121 mmol/L — ABNORMAL HIGH (ref 98–111)
Creatinine, Ser: 1.3 mg/dL — ABNORMAL HIGH (ref 0.61–1.24)
Creatinine, Ser: 1.37 mg/dL — ABNORMAL HIGH (ref 0.61–1.24)
GFR, Estimated: >60 mL/min (ref >60)
GFR, Estimated: 59 mL/min — ABNORMAL LOW (ref >60)
Glucose, Bld: 180 mg/dL — ABNORMAL HIGH (ref 70–99)
Glucose, Bld: 204 mg/dL — ABNORMAL HIGH (ref 70–99)
Potassium: 4.9 mmol/L (ref 3.5–5.1)
Potassium: 5.1 mmol/L (ref 3.5–5.1)
Sodium: 148 mmol/L — ABNORMAL HIGH (ref 135–145)
Sodium: 150 mmol/L — ABNORMAL HIGH (ref 135–145)

## 2020-09-17 LAB — APTT
aPTT: 56 seconds — ABNORMAL HIGH (ref 24–36)
aPTT: 69 seconds — ABNORMAL HIGH (ref 24–36)

## 2020-09-17 LAB — LACTIC ACID, PLASMA
Lactic Acid, Venous: 1.6 mmol/L (ref 0.5–1.9)
Lactic Acid, Venous: 1.4 mmol/L (ref 0.5–1.9)

## 2020-09-17 LAB — GLUCOSE, CAPILLARY
Glucose-Capillary: 169 mg/dL — ABNORMAL HIGH (ref 70–99)
Glucose-Capillary: 152 mg/dL — ABNORMAL HIGH (ref 70–99)
Glucose-Capillary: 180 mg/dL — ABNORMAL HIGH (ref 70–99)
Glucose-Capillary: 185 mg/dL — ABNORMAL HIGH (ref 70–99)
Glucose-Capillary: 193 mg/dL — ABNORMAL HIGH (ref 70–99)

## 2020-09-17 LAB — PROTIME-INR
INR: 3 — ABNORMAL HIGH (ref 0.8–1.2)
Prothrombin Time: 29.8 seconds — ABNORMAL HIGH (ref 11.4–15.2)

## 2020-09-17 LAB — LACTATE DEHYDROGENASE: LDH: 417 U/L — ABNORMAL HIGH (ref 98–192)

## 2020-09-17 MED ORDER — SODIUM CHLORIDE 0.9% IV SOLUTION: Freq: Once | INTRAVENOUS | Status: AC

## 2020-09-17 MED ORDER — CLONIDINE HCL 0.2 MG PO TABS
0.2000 mg | ORAL_TABLET | Freq: Three times a day (TID) | ORAL | Status: DC
  Administered 2020-09-17 – 2020-09-18 (×5): 0.2 mg
  Filled 2020-09-17 (×4): qty 1

## 2020-09-17 MED ORDER — INSULIN DETEMIR 100 UNIT/ML ~~LOC~~ SOLN
38.0000 [IU] | Freq: Two times a day (BID) | SUBCUTANEOUS | Status: DC
  Administered 2020-09-17 – 2020-09-20 (×8): 38 [IU] via SUBCUTANEOUS
  Filled 2020-09-17 (×10): qty 0.38

## 2020-09-17 MED ORDER — HYDRALAZINE HCL 25 MG PO TABS
25.0000 mg | ORAL_TABLET | Freq: Three times a day (TID) | ORAL | Status: DC
  Administered 2020-09-17 – 2020-09-20 (×10): 25 mg
  Filled 2020-09-17 (×10): qty 1

## 2020-09-17 MED ORDER — FUROSEMIDE 10 MG/ML IJ SOLN
40.0000 mg | Freq: Two times a day (BID) | INTRAMUSCULAR | Status: DC
  Administered 2020-09-17 – 2020-09-21 (×9): 40 mg via INTRAVENOUS
  Filled 2020-09-17 (×9): qty 4

## 2020-09-17 NOTE — Progress Notes (Signed)
NAME:  Donald Rowe, MRN:  [id number], DOB:  31-Oct-1960, LOS: 18 ADMISSION DATE:  09/03/2020, CONSULTATION DATE:  12/6 REFERRING MD:  Dr. Aileen Fass, CHIEF COMPLAINT:  SOB    Brief History   59 y/o M admitted 12/4 with 1 week hx of SOB, known COVID positive.  He is unvaccinated.  CTA chest negative for PE but demonstrated diffuse bilateral infiltrates.   History of present illness   59 y/o unvaccinated male, admitted 12/4 with a one week history of shortness of breath.    The patient began feeling poorly the day before Thanksgiving with fever, weakness and body aches. He tested positive for COVID on 12/27 prior to presentation.  He developed progressive shortness of breath.  In the ER, the patient was found to have saturations of 78%.  He was started on 6L O2 with initial improvement in saturations.  The patient was admitted per [facility] CTA of the Chest was assessed and negative for pulmonary embolism but showed diffuse bilateral infiltrates. He was treated with IV remdesivir, steroids and baricitinib.  Initially, he was also treated with antibiotics but these were stopped on 12/6 with a low procalcitonin.  LE doppler negative for DVT. The patient elected to be DNR / no intubation.  He developed worsening respiratory failure with hypoxia requiring 60L flow 100% and PCCM consulted 12/6 for evaluation.   Past Medical History  Hypothyroidism  HLD   Significant Hospital Events/Procedures  12/04 Admit  12/06 PCCM consulted  12/07 Intubated, central line, a line 12/08 VV ECMO Cannulaton, cortrak 12/13 circuit change for hemolytic anemia 12/16 Tracheostomy  12/17 bronch w/ BAL  Consults:  PCCM, Heart failure, TCTS, ECMO  Significant Diagnostic Tests:   CTA Chest 12/4 >> extensive bilateral airspace disease, no large PE identified, limited study   LE Venous Duplex 12/4 >> negative for DVT bilaterally   LE venous duplex 12/8> BLE DVTs  Micro Data:  COVID 12/4 >> negative  Influenza A/B  12/4 >> negative  MRSA PCR 12/5 >> negative  BCx2 12/4 >> NG Trach aspirate 12/12> rare MRSA BAL 12/17> abundant WBC Aspergillus Ag BAL 12/17>  Antimicrobials:  Azithromycin 12/5 >> 12/6  Ceftriaxone 12/5 >> 12/6  Cefepime 12/7>12/14 vanc 12/14> Meropenem 12/17>  Interim history/subjective:  Still some endotracheal bleeding. 1 unit pRBCs overnight. Some BRBpR this morning. Remains on cleviprex for hypertension. Yesterday hypotensive with PLEX.   Objective   Blood pressure (!) 156/67, pulse 91, temperature 97.88 F (36.6 C), resp. rate 11, height _0  (1.753 m), weight 122.4 kg, SpO2 96 %.    Vent Mode: PCV FiO2 (%):  [50 %] 50 % Set Rate:  [12 bmp] 12 bmp PEEP:  [10 cmH20] 10 cmH20 Plateau Pressure:  [20 cmH20-24 cmH20] 24 cmH20   Intake/Output Summary (Last 24 hours) at 09/17/2020 0647 Last data filed at 09/17/2020 0600 Gross per 24 hour  Intake 4504.01 ml  Output 3505 ml  Net 999.01 ml   Filed Weights   09/15/20 0100 09/16/20 0030 09/17/20 0445  Weight: 119.4 kg 122.5 kg 122.4 kg    Examination: Constitutional: critically ill appearing man laying in bed in NAD, trached, sedated on MV & ECMO Eyes: icteric sclera, pupils small but reactive. Ears, nose, mouth, and throat: dry oral mucosa Neck: clotted blood but no active bleeding around tracheostomy Cardiovascular: tachycardic, regular rhythm Respiratory: faint rhales bilaterally, Vt ~220cc Gastrointestinal: obese, soft, NT Skin: Pallor. Bruise on left lower abdomen.  Neurologic: PERRL, breathing spotaneously Psychiatric: RASS -5   Resolved  Hospital Problem list      Assessment & Plan:   Acute hypoxemic and hypercarbic respiratory failure secondary to COVID PNA with severe ARDS. S/p VV ECMO cannulation 09/28/2020.  Tracheostomy 16/10 complicated by trach site bleeding HAP -Completed remdesivir.  Completed baricitinib.  Completed CAP coverage. -con't full ECMO support; bival for AC -con't ultralung protective  ventilation; goal FiO2 50% or less -con't solumedrol -routine trach care -VAP prevention protocol -con't meropenem and vanc; deescalate based on cultures when able -con't plex- next tx tomorow -increase furosemide to 38m BID; strict I/Os  Bleeding around trach -hold bival 4 hours, restart with PTT goal ~50 -injected with lidocaine with 1% epi again this morning  HTN -con't metoprolol to 559mTID -increase clonidine to 0.71m66m8h -con't amlodipine 78m771mily -adding hydralazine 25mg60m  Agitated delirium requiring titration of IV sedation -PAD protocol; goal RASS -1 and tolerance of MV & ECMO -con't seroquel -con't enteral oxycodone and clonazepam  Bilateral lower ext DVTs- provoked by COVID infection -on bivaliriduin; goal PTT 50-55 currently  Steroid-induced hyperglycemia- uncontrolled -increase levemir to 38 units BID -con't TF coverage and SSI PRN -goal BG 140-180  Hypothyroidism -con't PTA synthroid   HLD -con't statin  Hemolysis causing hyperbilirubinemia- ongoing, circuit changed, has plateaued somewhat. Potentially due to uncontrolled sepsis.  Rheumatologic-associated MAHA remains in differential.   -Con't PLEX- next treatment tomorrow. Appreciate Nephrology's assistance  Hypervolemia Hypernatremia and hyperchloremia Azotemia -lasix 40mg 36m-strict I/Os -holding free water at 200cc Q2h until hypervolemia resolves -con't montoring  Acute anemia & thrombocytopenia- due to hemolysis, acute illness -con't to monitor -transfuse for Hb<8; 1 unit pRBCs overnight  Plan discussed with Palliative Care, Cardiology, ECMO specialist, RN, pharmacy at beside.    Best practice (evaluated daily)  Diet: TF Pain/Anxiety/Delirium protocol (if indicated): as above VAP protocol (if indicated): yes DVT prophylaxis: Bivalirudin GI prophylaxis: pantoprazole  Glucose control: basal-bolus Mobility: bedrest Last date of multidisciplinary goals of care discussion: wife  updated over the phone 12/15 Family and staff present RN, physicain Summary of discussion con't aggressive care Follow up goals of care discussion due: 12/22 Code Status: full Disposition: ICU  This patient is critically ill with multiple organ system failure which requires frequent high complexity decision making, assessment, support, evaluation, and titration of therapies. This was completed through the application of advanced monitoring technologies and extensive interpretation of multiple databases. During this encounter critical care time was devoted to patient care services described in this note for 51 minutes.  Donald Akhtar [name]2/19/21 8:05 AM [facility]

## 2020-09-17 NOTE — Progress Notes (Signed)
Assisted tele visit to patient with family member.  Raymondo Garcialopez Ann, RN  

## 2020-09-17 NOTE — Progress Notes (Signed)
Palliative Medicine Inpatient Follow Up Note  Reason for consult:  "ECMO"  HPI:  Per intake H&P --> Donald Rowe a 59 y.o.malewith a pertinent history ofhypothyroidism on levothyroxine and hyperlipidemia who was diagnosed with Covid 1 week ago and is not vaccinated. With EMS he was seen to be desaturating to 78% on room air. He states he started having symptoms on Friday and tested positive on Saturday.  Had initially elected to be DNAR without intubation though later changed this and got intubated on 12/6  in the setting of worsening respiratory failure with hypoxia. CTA chest w/ diffuse bilateral infiltrates. On 12/8 was cannulated for VV ECMO. Had been extubated on 12/9 then emergently reintubated in early evening due to clinical instability.   Palliative care was asked to get involved in the setting of VV ECMO to set goals and expectations with Zamir's family.  Today's Discussion (09/16/2020): Chart reviewed.  Attended ECMO rounds this morning with Dr. Chestine Spore, Dr. Gala Romney, Canary Brim RN, and Fannie Knee RRT.  Patient had some BRBPR which is being closely monitored. Tracheostomy site bleeding has improved s/p 1 unit PRBC on 12/17. Continues to be volume up - remains on lasix 40mg  BID, possible CRRT moving forward. Infection improvement with broad abx and plasmapheresis - next Tx tomorrow. Insulin modification in the setting of mild hyperglycemia - continues on steroids. Thumb mottling is stable. Plan to lift isolation today in the setting of low viral load.  I had a conference call with patient's wife and his daughter Donald Rowe this morning.  [name] shares that she is very frustrated that she has not gotten a call from one of the MDs.  I shared with her that one of our rules on the palliative care team is to round with all of the medical doctors, pharmacists, nurses and ECMO specialist to provide the family a daily comprehensive update.  She request that at the very least she hears  from the medical team every other day.  I shared with her that I could pass this along though I cannot guarantee that this will happen.  I shared that our team is happy to give updates to both [place] and [name] at the same time at a set daily hour.  We also discussed that checking MyChart a great frequency may be causing an increase in anxiety levels.  I emphasized that if something important is occurring a phone call will be made by the medical staff so that the family is attune to what is going on.  [name] shares with me that it does not seem like Donald Rowe is getting any better.  I have vocalized that in the patients we have seen it is truly a very long process to see improvement.  I discussed that Donald Rowe is critical and anything could happen at any time.  Emphasis on the reality that the medical team is doing any and everything they can to help Donald Rowe was made.  [name] asks about alternative modalities of treatment identified as "unconventional".  I asked her what modality she would like [name] to explore.  She asks if Kariem could get ivermectin, hydroxychloroquine, fluvoxamine, high dose budesonide. I was able to answer these question to the best of my ability. I recommended that we involve our pharmacist to speak to them further about these medications as they truly have the most expert knowledge to address these questions in greater detail. I was able to reach out to Dr. [name] who is in agreement with meeting with Lonn Georgia and Elmarie Shiley  about these questions.  [name] and Donald Rowe were later informed that [name]'s isolation restrictions would be lifted.  [name] plans to visit today.  I reinforced that her visitation policy is 1 person a day with no exceptions.    Objective Assessment: Vital Signs Vitals:   09/17/20 0800 09/17/20 0917  BP:  (!) 144/52  Pulse: 100   Resp: 12   Temp: 98.06 F (36.7 C)   SpO2: 90%     Intake/Output Summary (Last 24 hours) at 09/17/2020 2800 Last data filed at 09/17/2020  0800 Gross per 24 hour  Intake 4106.58 ml  Output 3055 ml  Net 1051.58 ml   Last Weight  Most recent update: 09/17/2020  4:52 AM   Weight  122.4 kg (269 lb 13.5 oz)           Gen:  Middle age caucasian M intubated and sedated HEENT: trach in place, dry mucous membranes CV: Regular rate and rhythm  PULM: on vent support Neuro: Open eyes to name being called and chest stimulation  SUMMARY OF RECOMMENDATIONS Full Code/Full Scope of Care  ECMO as a bridge to recovery  Spiritual support appreciated  Provided patients wife, Donald Rowe and daughter, Elmarie Shiley a daily update  Patients family would like an every other day physician update if possible  Appreciate Dr. Lonn Georgia [facility]) aiding in medication discussions with family  Ongoing PMT support - Plan to attend daily rounds with the medical teams  I am not back tomorrow though my colleague, [name] will see Brenten on Tuesday  Time Spent: 65 Greater than 50% of the time was spent in counseling and coordination of care ______________________________________________________________________________________ [name] [facility] Palliative Medicine Team Team Cell Phone: [phone] Please utilize secure chat with additional questions, if there is no response within 30 minutes please call the above phone number  Palliative Medicine Team providers are available by phone from 7am to 7pm daily and can be reached through the team cell phone.  Should this patient require assistance outside of these hours, please call the patient's attending physician.

## 2020-09-17 NOTE — Progress Notes (Signed)
Patient ID: Donald Rowe, male   DOB: 30-Apr-1961, 59 y.o.   MRN: [id number]    Advanced Heart Failure Rounding Note   Subjective:    12/8 Cannulated for VV ECMO - 32 FR  RIJ Crescent 12/9 Extubated/Reintubated for altered mental status 12/13 Circuit change for elevated LDH 12/15 PLEX started 12/16 Tracheostomy  Bleeding around trach has slowed. Having mild BRBPR. Got 1 uRBCs. Had PLEX #3 yesterday. Developed profound hypotension during PLEX and required Neo. Now off. LDH down to 417.   Sweep up to 13.5 overnight now back down to 13. Got 1 dose of IV lasix 20 yesterday. Good diuresis.   CXR with diffuse infiltrates bilaterally. Worse today. Personally reviewed  ECMO   Speed 3835 Flow 5.36L Sweep 13 dP 23 pVen  -104  ABG: 7.35/38/50/85% Hgb 8.8 LDH 1295  -> PLEX #1 -> 695 -> 916 -> PLEX #2  -> 502 -> PLEX #3 -> 417 Lactic acid 1.6 PTT 56  Vent 40% TVs 200s   Objective:   Weight Range:  Vital Signs:   Temp:  [97.7 F (36.5 C)-98.6 F (37 C)] 98.06 F (36.7 C) (12/19 0645) Pulse Rate:  [88-117] 90 (12/19 0645) Resp:  [0-24] 10 (12/19 0645) BP: (87-206)/(35-69) 156/67 (12/18 1618) SpO2:  [75 %-97 %] 96 % (12/19 0645) Arterial Line BP: (80-212)/(35-80) 143/49 (12/19 0645) FiO2 (%):  [50 %] 50 % (12/19 0628) Weight:  [122.4 kg] 122.4 kg (12/19 0445) Last BM Date: 09/12/20  Weight change: Filed Weights   09/15/20 0100 09/16/20 0030 09/17/20 0445  Weight: 119.4 kg 122.5 kg 122.4 kg    Intake/Output:   Intake/Output Summary (Last 24 hours) at 09/17/2020 0734 Last data filed at 09/17/2020 0600 Gross per 24 hour  Intake 4279.8 ml  Output 3405 ml  Net 874.8 ml     Physical Exam: General: Sedated on vent.  HEENT: normal + ETT Neck: supple. + RIJ ECMO + trach with dried blood   Cor: PMI nondisplaced. Regular rate & rhythm. No rubs, gallops or murmurs. Lungs: coarse Abdomen: obese soft, nontender, nondistended. No hepatosplenomegaly. No bruits or masses.  Good bowel sounds. Extremities: no cyanosis, clubbing, rash, 3+ edema Neuro: sedated on vent    Telemetry: sinus 90-100 Personally reviewed   Labs: Basic Metabolic Panel: Recent Labs  Lab 09/11/20 1615 09/11/20 1617 09/15/20 1618 09/15/20 2050 09/16/20 0357 09/16/20 0405 09/16/20 1145 09/16/20 1223 09/16/20 1615 09/16/20 1742 09/16/20 2143 09/16/20 2309 09/17/20 0454 09/17/20 0503 09/17/20 0627  NA 146*   < > 149*   < > 148*   < > 149*   < > 149*   < > 152* 154* 148* 152* 151*  K 3.9   < > 4.7   < > 5.0   < > 4.8   < > 5.2*   < > 5.2* 5.1 4.9 5.0 5.0  CL 111   < > 120*  --  118*  --  121*  --  122*  --   --   --  121*  --   --   CO2 25   < > 23  --  22  --  21*  --  20*  --   --   --  21*  --   --   GLUCOSE 142*   < > 175*  --  215*  --  174*  --  204*  --   --   --  180*  --   --   BUN 51*   < >  79*  --  84*  --  80*  --  79*  --   --   --  87*  --   --   CREATININE 1.16   < > 1.41*  --  1.35*  --  1.26*  --  1.32*  --   --   --  1.30*  --   --   CALCIUM 7.7*   < > 7.5*  --  7.8*  --  7.4*  --  7.6*  --   --   --  7.7*  --   --   PHOS 3.4  --   --   --   --   --   --   --   --   --   --   --   --   --   --    < > = values in this interval not displayed.    Liver Function Tests: Recent Labs  Lab 09/13/20 0304 09/14/20 0352 09/15/20 0312 09/16/20 0357 09/17/20 0454  AST 56* 38 44* 31 40  ALT 36 27 37 27 39  ALKPHOS 153* 102 137* 73 70  BILITOT 2.6* 2.2* 2.5* 2.4* 2.8*  PROT 5.1* 4.5* 4.5* 4.0* 4.2*  ALBUMIN 1.9* 3.1* 2.7* 3.0* 3.3*   No results for input(s): LIPASE, AMYLASE in the last 168 hours. Recent Labs  Lab 09/14/20 0821  AMMONIA 48*    CBC: Recent Labs  Lab 09/15/20 1618 09/15/20 2050 09/15/20 2053 09/16/20 0357 09/16/20 0405 09/16/20 1615 09/16/20 1742 09/16/20 2143 09/16/20 2309 09/17/20 0454 09/17/20 0503 09/17/20 0627  WBC 16.8*  --  17.5* 13.2*  --  14.4*  --   --   --  14.8*  --   --   HGB 8.6*   < > 8.4* 7.8*   < > 8.2*   < >  7.8* 7.1* 8.8* 8.2* 7.5*  HCT 26.4*   < > 27.3* 25.0*   < > 26.7*   < > 23.0* 21.0* 28.1* 24.0* 22.0*  MCV 97.1  --  97.8 98.4  --  97.4  --   --   --  97.9  --   --   PLT 131*  --  133* 122*  --  122*  --   --   --  123*  --   --    < > = values in this interval not displayed.    Cardiac Enzymes: No results for input(s): CKTOTAL, CKMB, CKMBINDEX, TROPONINI in the last 168 hours.  BNP: BNP (last 3 results) No results for input(s): BNP in the last 8760 hours.  ProBNP (last 3 results) No results for input(s): PROBNP in the last 8760 hours.    Other results:  Imaging: DG CHEST PORT 1 VIEW  Result Date: 09/16/2020 CLINICAL DATA:  ECMO EXAM: PORTABLE CHEST 1 VIEW COMPARISON:  Chest radiograph from one day prior. FINDINGS: Tracheostomy tube tip overlies the tracheal air column at the thoracic inlet. Right PICC terminates over the cavoatrial junction. Left internal jugular central venous catheter terminates in upper third of the SVC. Enteric tube enters stomach with the tip not seen on this image. Stable right neck ECMO catheter coursing into the IVC. No pneumothorax. Complete opacification of the bilateral hemithoraces, worsened, with stable central air bronchograms bilaterally. IMPRESSION: 1. Support structures as detailed. No pneumothorax. 2. Complete opacification of the bilateral hemithoraces, worsened, with stable central air bronchograms bilaterally, compatible with ARDS/pneumonia/atelectasis. Electronically Signed   By: Jannifer Rodney.D.  On: 09/16/2020 11:49   [place] EKG SITE RITE  Result Date: 09/15/2020 If Site Rite image not attached, placement could not be confirmed due to current cardiac rhythm.    Medications:     Scheduled Medications: . amLODipine  10 mg Per Tube Daily  . aspirin  81 mg Per Tube Daily  . calcium gluconate  2 g Intravenous Once  . chlorhexidine gluconate (MEDLINE KIT)  15 mL Mouth Rinse BID  . Chlorhexidine Gluconate Cloth  6 each Topical Daily  .  clonazePAM  2 mg Per Tube BID  . cloNIDine  0.2 mg Per Tube BID  . docusate  100 mg Per Tube BID  . feeding supplement (PROSource TF)  45 mL Per Tube TID  . fiber  1 packet Per Tube BID  . free water  200 mL Per Tube Q2H  . furosemide  40 mg Intravenous BID  . insulin aspart  10 Units Subcutaneous Q4H  . insulin aspart  2-6 Units Subcutaneous Q4H  . insulin detemir  38 Units Subcutaneous Q12H  . lactulose  20 g Per Tube BID  . levothyroxine  50 mcg Per Tube Q0600  . mouth rinse  15 mL Mouth Rinse 10 times per day  . methylPREDNISolone (SOLU-MEDROL) injection  60 mg Intravenous Q6H  . metoprolol tartrate  50 mg Per Tube Q8H  . oxyCODONE  15 mg Per Tube Q6H  . pantoprazole sodium  40 mg Per Tube Daily  . pravastatin  40 mg Per Tube q1800  . QUEtiapine  100 mg Per Tube BID  . sodium chloride flush  10-40 mL Intracatheter Q12H    Infusions: . sodium chloride Stopped (09/16/20 1146)  . sodium chloride    . albumin human    . albumin human 12.5 g (09/16/20 1004)  . bivalirudin (ANGIOMAX) infusion 0.5 mg/mL (Non-ACS indications) 0.13 mg/kg/hr (09/17/20 0600)  . citrate dextrose    . citrate dextrose    . clevidipine 2 mg/hr (09/17/20 0600)  . dexmedetomidine (PRECEDEX) IV infusion Stopped (09/16/20 1942)  . dextrose    . feeding supplement (PIVOT 1.5 CAL) 70 mL/hr at 09/17/20 0600  . fentaNYL infusion INTRAVENOUS 400 mcg/hr (09/17/20 0600)  . meropenem (MERREM) IV Stopped (09/17/20 0554)  . midazolam 10 mg/hr (09/17/20 0600)  . norepinephrine (LEVOPHED) Adult infusion Stopped (09/12/20 1530)  . phenylephrine (NEO-SYNEPHRINE) Adult infusion Stopped (09/16/20 1426)  . propofol (DIPRIVAN) infusion 15 mcg/kg/min (09/17/20 0600)  . vancomycin Stopped (09/17/20 0431)    PRN Medications: sodium chloride, acetaminophen (TYLENOL) oral liquid 160 mg/5 mL, acetaminophen, acetaminophen, albumin human, albuterol, dextrose, diphenhydrAMINE, diphenhydrAMINE, fentaNYL, heparin, labetalol,  LORazepam, ondansetron **OR** ondansetron (ZOFRAN) IV, polyethylene glycol, sodium chloride flush   Assessment/Plan:   1. Acute hypoxic respiratory failure/ARDS in setting of COVID-19 PNA - Cannulated for VV ECMO on 12/8 after failing intubation x 2 days - has complete remedisivir, baricitinib, Solumedrol.  - s/p trach 12/16 - CXR worse today. Sweep remains at 13. Oxygenation marginal but OK - evidence of worsening acidosis/infection 12/17 mero added. Improved - staph in tracheal aspirate -  vanc added 12/14 mero added 12/17 - circuit changed 12/13 for elevated LDH and lower flows. PLEX started 12/15. S/p PLEX #3 yesterday. Will get 4th run tomorrow. LDH coming down - Having some bleeding from trach and rectum. PTT goal turned down to 50-60. Circuit looks ok  - Driving pressure ok - volume status remains elevated. Increase lasix to 40 IV bid. May need CVVHD via PLEX line  2.  Bilateral DVT - remains on bival - Dosing d/w PharmD  3. DM2 - on insulin gtt. CBGs high. Increasing today  4. Obesity -Body mass index is 33.73 kg/m.  5. F/E/N - Na 147 -> 153 -> 146 -> 149 -> 148 -> 148 - Continue free water  6. HTN - Remains high  - Increase clonidine. Add hydralazine - Add hydralazine  7. Dusky R thumb - slightly improved  8. AKI - creatinine stable. BUN climbing - markedly volume overloaded. - increase lasix to 40 IV bid - may need dialysis for volume removal using PLEX line  9. Constipation - improved  Plan discussed on multi-discplinary ECMO rounds with CCM, ECMO coordinator/specialist, PharmDs and RNs.  CRITICAL CARE Performed by: Glori Bickers  CRITICAL CARE Performed by: Glori Bickers  Total critical care time: 40 minutes  Critical care time was exclusive of separately billable procedures and treating other patients.  Critical care was necessary to treat or prevent imminent or life-threatening deterioration.  Critical care was time spent  personally by me (independent of midlevel providers or residents) on the following activities: development of treatment plan with patient and/or surrogate as well as nursing, discussions with consultants, evaluation of patient's response to treatment, examination of patient, obtaining history from patient or surrogate, ordering and performing treatments and interventions, ordering and review of laboratory studies, ordering and review of radiographic studies, pulse oximetry and re-evaluation of patient's condition.    Length of Stay: 15   Glori Bickers MD 09/17/2020, 7:34 AM  Advanced Heart Failure Team Pager [phone] (M-F; [place])  Please contact [facility] for night-coverage after hours (4p -7a ) and weekends on [email or web address]

## 2020-09-17 NOTE — Progress Notes (Signed)
ANTICOAGULATION CONSULT NOTE  Pharmacy Consult for bivalirudin Indication: ECMO + bilateral DVTs   Recent Labs    09/15/20 0312 09/15/20 0326 09/16/20 0357 09/16/20 0405 09/16/20 1615 09/16/20 1742 09/16/20 2136 09/16/20 2143 09/17/20 0454 09/17/20 0503 09/17/20 0627 09/17/20 1722 09/17/20 1724  HGB 9.2*   < > 7.8*   < > 8.2*   < >  --    < > 8.8*   < > 7.5* 8.2* 7.1*  HCT 29.5*   < > 25.0*   < > 26.7*   < >  --    < > 28.1*   < > 22.0* 25.5* 21.0*  PLT 150   < > 122*  --  122*  --   --   --  123*  --   --  122*  --   APTT 55*   < > 64*  --  76*  --  66*  --  56*  --   --  69*  --   LABPROT 25.7*  --  32.9*  --   --   --   --   --  29.8*  --   --   --   --   INR 2.4*  --  3.4*  --   --   --   --   --  3.0*  --   --   --   --   CREATININE 1.55*   < > 1.35*   < > 1.32*  --   --   --  1.30*  --   --  1.37*  --    < > = values in this interval not displayed.    Estimated Creatinine Clearance: 75.1 mL/min (A) (by C-G formula based on SCr of 1.37 mg/dL (H)).   Assessment: 39 yom unvaccinated presenting with severe COVID ARDs, intubated on 12/6, continued to have ventilation issues despite NMB and proning - started on VV ECMO on 12/8.  D-dimer >20 > now improved 14, found to have bilateral DVTs.  Given significant bleeding issues from trach, targeting lower aPTT goal of 50-55 seconds after discussion with ECMO team.   aPTT is now above goal at 69 seconds, evening CBC stable.  Goal of Therapy:  aPTT 50-55 seconds Monitor platelets by anticoagulation protocol: Yes   Plan:  Reduce bivalirudin to 0.11 mg/kg/hr (dosing wt 103.6 kg) Check q12h aPTT Follow bleeding, CBC   Fredonia Highland, PharmD, BCPS, [facility] Clinical Pharmacist [phone] Please check AMION for all [facility] Pharmacy numbers 09/17/2020

## 2020-09-17 NOTE — Progress Notes (Signed)
Acute hypotension from maps of 70s to 48 about 5 minutes after PO metoprolol 50mg , hydralazine 25mg , clonidine 0.1mg , oxycodone 15mg , clonazepam 2mg , and seroquiel 100mg  given. Did not respond to albumin levophed was started. Levo currently running at 15 with a map of 75. Stat cbc sent.

## 2020-09-17 NOTE — Progress Notes (Signed)
Patient ID: Donald Rowe, male   DOB: 08/06/1961, 59 y.o.   MRN: [id number] [facility] Progress Note   Assessment/ Plan:   1.  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    Nonoliguric with stable creatinine at around 1.3; BUN elevated for multiple reasons including corticosteroids/tube feeds/ongoing diuretics 2.  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  3.  Acute hypoxic respiratory failure: Secondary to COVID-19 infection, on VV ECMO and attempting strategies to optimize ventilation and oxygenation. 4.  Bilateral lower extremity DVTs: Secondary to prothrombotic state in COVID-19 infection.  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 5.  Healthcare associated pneumonia/MRSA tracheitis: Continue on intravenous vancomycin.  Subjective:   He developed hypotension during plasma exchange yesterday prompting temporary cessation of therapy and Neo-Synephrine.  Improved hemodynamic status overnight and now off pressors.   Objective:   BP (!) 183/56   Pulse 100   Temp 98.06 F (36.7 C) (Core)   Resp 12   Ht $R'5\' 9"'jy$  (1.753 m)   Wt 122.4 kg   SpO2 90%   BMI 39.85 kg/m   Intake/Output Summary (Last 24 hours) at 09/17/2020 0858 Last data filed at 09/17/2020 0800 Gross per 24 hour  Intake 4254.88 ml  Output 3405 ml  Net 849.88 ml   Weight change: -0.1 kg  Physical Exam: Patient not personally examined to limit contact with COVID-19/preserve PPE.  Pertinent positive findings reviewed with Dr. Haroldine Laws and bedside RN.  Imaging: DG CHEST PORT 1 VIEW  Result Date: 09/16/2020 CLINICAL DATA:  ECMO EXAM: PORTABLE CHEST 1 VIEW COMPARISON:  Chest radiograph from one day prior. FINDINGS: Tracheostomy tube tip overlies  the tracheal air column at the thoracic inlet. Right PICC terminates over the cavoatrial junction. Left internal jugular central venous catheter terminates in upper third of the SVC. Enteric tube enters stomach with the tip not seen on this image. Stable right neck ECMO catheter coursing into the IVC. No pneumothorax. Complete opacification of the bilateral hemithoraces, worsened, with stable central air bronchograms bilaterally. IMPRESSION: 1. Support structures as detailed. No pneumothorax. 2. Complete opacification of the bilateral hemithoraces, worsened, with stable central air bronchograms bilaterally, compatible with ARDS/pneumonia/atelectasis. Electronically Signed   By: Ilona Sorrel M.D.   On: 09/16/2020 11:49    Labs: [employer] Recent Labs  Lab 09/11/20 1615 09/11/20 1617 09/14/20 1645 09/14/20 1649 09/15/20 0312 09/15/20 0326 09/15/20 1618 09/15/20 2050 09/16/20 0357 09/16/20 0405 09/16/20 1145 09/16/20 1223 09/16/20 1615 09/16/20 1742 09/16/20 2143 09/16/20 2309 09/17/20 0454 09/17/20 0503 09/17/20 0627  NA 146*   < > 150*   < > 149*   < > 149*   < > 148*   < > 149*   < > 149* 150* 152* 154* 148* 152* 151*  K 3.9   < > 5.2*   < > 4.8   < > 4.7   < > 5.0   < > 4.8   < > 5.2* 5.1 5.2* 5.1 4.9 5.0 5.0  CL 111   < > 116*  --  117*  --  120*  --  118*  --  121*  --  122*  --   --   --  121*  --   --   CO2 25   < > 24  --  23  --  23  --  22  --  21*  --  20*  --   --   --  21*  --   --   GLUCOSE 142*   < > 155*  --  215*  --  175*  --  215*  --  174*  --  204*  --   --   --  180*  --   --   BUN 51*   < > 71*  --  82*  --  79*  --  84*  --  80*  --  79*  --   --   --  87*  --   --   CREATININE 1.16   < > 1.33*  --  1.55*  --  1.41*  --  1.35*  --  1.26*  --  1.32*  --   --   --  1.30*  --   --   CALCIUM 7.7*   < > 8.1*  --  8.0*  --  7.5*  --  7.8*  --  7.4*  --  7.6*  --   --   --  7.7*  --   --   PHOS 3.4  --   --   --   --   --   --   --   --   --   --   --   --   --   --   --    --   --   --    < > = values in this interval not displayed.   CBC Recent Labs  Lab 09/15/20 2053 09/16/20 0357 09/16/20 0405 09/16/20 1615 09/16/20 1742 09/16/20 2309 09/17/20 0454 09/17/20 0503 09/17/20 0627  WBC 17.5* 13.2*  --  14.4*  --   --  14.8*  --   --   HGB 8.4* 7.8*   < > 8.2*   < > 7.1* 8.8* 8.2* 7.5*  HCT 27.3* 25.0*   < > 26.7*   < > 21.0* 28.1* 24.0* 22.0*  MCV 97.8 98.4  --  97.4  --   --  97.9  --   --   PLT 133* 122*  --  122*  --   --  123*  --   --    < > = values in this interval not displayed.    Medications:    . amLODipine  10 mg Per Tube Daily  . aspirin  81 mg Per Tube Daily  . calcium gluconate  2 g Intravenous Once  . chlorhexidine gluconate (MEDLINE KIT)  15 mL Mouth Rinse BID  . Chlorhexidine Gluconate Cloth  6 each Topical Daily  . clonazePAM  2 mg Per Tube BID  . cloNIDine  0.2 mg Per Tube Q8H  . docusate  100 mg Per Tube BID  . feeding supplement (PROSource TF)  45 mL Per Tube TID  . fiber  1 packet Per Tube BID  . free water  200 mL Per Tube Q2H  . furosemide  40 mg Intravenous BID  . hydrALAZINE  25 mg Per Tube Q8H  . insulin aspart  10 Units Subcutaneous Q4H  . insulin aspart  2-6 Units Subcutaneous Q4H  . insulin detemir  38 Units Subcutaneous Q12H  . lactulose  20 g Per Tube BID  . levothyroxine  50 mcg Per Tube Q0600  . mouth rinse  15 mL Mouth Rinse 10 times per day  . methylPREDNISolone (SOLU-MEDROL) injection  60 mg Intravenous  Q6H  . metoprolol tartrate  50 mg Per Tube Q8H  . oxyCODONE  15 mg Per Tube Q6H  . pantoprazole sodium  40 mg Per Tube Daily  . pravastatin  40 mg Per Tube q1800  . QUEtiapine  100 mg Per Tube BID  . sodium chloride flush  10-40 mL Intracatheter Q12H   Elmarie Shiley, MD 09/17/2020, 8:58 AM

## 2020-09-17 NOTE — Procedures (Signed)
Extracorporeal support note     ECLS cannulation: 08/31/2020 Last circuit change: 09/11/2020 Indication: COVD ARDS   Configuration: VV, RIJ 32Fr [place]   Pump speed: 3835 Pump flow: 5.25 Pump used: Cardiohelp   Sweep gas: 100%, 13LPM   Circuit check: 1 small clot in corner Anticoagulant: bivalirudin Anticoagulation targets: PTT 50-55    Changes in support: Received Plax yesterday; next treatment Monday. Increasing antihypertensives. Continue current support.  Minimize sedation as able- cough has been limiting factor.  Con't antibiotics. Follow cultures.   Anticipated goals/duration of support: bridge to recovery   Discussed care with ECMO team, Palliative Care, RN, pharmacy.  Steffanie Dunn, DO 09/17/20 8:09 AM [facility]

## 2020-09-17 NOTE — Progress Notes (Signed)
ANTICOAGULATION CONSULT NOTE  Pharmacy Consult for bivalirudin Indication: ECMO + bilateral DVTs   Recent Labs    09/15/20 0312 09/15/20 0326 09/16/20 0357 09/16/20 0405 09/16/20 1145 09/16/20 1223 09/16/20 1615 09/16/20 1742 09/16/20 2136 09/16/20 2143 09/17/20 0454 09/17/20 0503 09/17/20 0627  HGB 9.2*   < > 7.8*   < >  --    < > 8.2*   < >  --    < > 8.8* 8.2* 7.5*  HCT 29.5*   < > 25.0*   < >  --    < > 26.7*   < >  --    < > 28.1* 24.0* 22.0*  PLT 150   < > 122*  --   --   --  122*  --   --   --  123*  --   --   APTT 55*   < > 64*  --   --   --  76*  --  66*  --  56*  --   --   LABPROT 25.7*  --  32.9*  --   --   --   --   --   --   --   --   --   --   INR 2.4*  --  3.4*  --   --   --   --   --   --   --   --   --   --   CREATININE 1.55*   < > 1.35*  --  1.26*  --  1.32*  --   --   --  1.30*  --   --    < > = values in this interval not displayed.    Estimated Creatinine Clearance: 79.1 mL/min (A) (by C-G formula based on SCr of 1.3 mg/dL (H)).   Assessment: 15 yom unvaccinated presenting with severe COVID ARDs, intubated on 12/6, continued to have ventilation issues despite NMB and proning - started on VV ECMO on 12/8.  D-dimer >20 > now improved 14, found to have bilateral DVTs.  Given significant bleeding issues from trach, targeting lower aPTT goal of 50-55 seconds after discussion with ECMO team.   aPTT is slightly above goal at 56 seconds on bivalirudin 0.13 mg/kg/hr. Bleeding at trach site is now resolved, but he now has bright red blood from rectum. CBC remains stable. LDH continues to decrease, down to 417 now with PLEX.  Goal of Therapy:  aPTT 50-55 seconds Monitor platelets by anticoagulation protocol: Yes   Plan:  Reduce bivalirudin to 0.125 mg/kg/hr (dosing wt 103.6 kg) Check 5pm aPTT Monitor CBC, ECMO circuit, and bleeding daily  Tama Headings, PharmD, BCPS PGY2 Cardiology Pharmacy Resident Phone: [phone] 09/17/2020  7:58 AM  Please check  [email or web address] for unit-specific pharmacy phone numbers.

## 2020-09-17 NOTE — Progress Notes (Signed)
Assisted tele visit to patient with family member.  Ginia Rudell M, RN   

## 2020-09-17 NOTE — Progress Notes (Signed)
Pharmacy Antibiotic Note  Alonza Knisley is a 59 y.o. male admitted on 09/01/2020 with pneumonia.  Pharmacy has been consulted for vancomycin dosing.  Completed 7d of cefepime. Started on Meropenem on 12/17. WBC up slightly at 14.8. Scr stable at 1.3.  Afebrile. CXR still showing severe diffuse infiltrates.     Plan: Continue vancomycin 1 gm IV q12 hrs. Continue meropenem 2 gm IV q8 hrs Will watch renal function closely and will adjust closely. Monitor cx results, clinical pic, renal fx Check VT with morning labs tomorrow  Height: _0  (175.3 cm) Weight: 122.4 kg (269 lb 13.5 oz) IBW/kg (Calculated) : 70.7  Temp (24hrs), Avg:98.1 F (36.7 C), Min:97.7 F (36.5 C), Max:98.6 F (37 C)  Recent Labs  Lab 09/13/20 1157 09/13/20 1700 09/14/20 0352 09/14/20 1645 09/15/20 0312 09/15/20 1618 09/15/20 2053 09/16/20 0357 09/16/20 1145 09/16/20 1615 09/17/20 0454  WBC  --    < > 18.8*   < > 21.7* 16.8* 17.5* 13.2*  --  14.4* 14.8*  CREATININE  --    < > 1.46*   < > 1.55* 1.41*  --  1.35* 1.26* 1.32* 1.30*  LATICACIDVEN  --    < > 1.7   < > 2.7* 1.4  --  1.2  --  1.3 1.6  VANCORANDOM 18  --  18  --   --   --   --   --   --   --   --    < > = values in this interval not displayed.    Estimated Creatinine Clearance: 79.1 mL/min (A) (by C-G formula based on SCr of 1.3 mg/dL (H)).    No Known Allergies  Antimicrobials this admission: Meropenem 12/17>> Vancomycin 12/14 >>  Cefepime 12/7 >> 12/14; 12/17 Ceftriaxone 12/5>> 12/6 Azithromycin 12/5>12/6  Microbiology results: 12/4 BCx: neg 12/5 MRSA PCR: neg 12/12 Sputum: rare MRSA   12/17 BAL: ngtd   Richardine Service, PharmD, BCPS PGY2 Cardiology Pharmacy Resident Phone: [phone] 09/17/2020  8:21 AM  Please check [email or web address] for unit-specific pharmacy phone numbers.

## 2020-09-18 ENCOUNTER — Inpatient Hospital Stay (HOSPITAL_COMMUNITY): Payer: HRSA Program

## 2020-09-18 DIAGNOSIS — U071 COVID-19: Secondary | ICD-10-CM | POA: Diagnosis not present

## 2020-09-18 DIAGNOSIS — J1282 Pneumonia due to coronavirus disease 2019: Secondary | ICD-10-CM | POA: Diagnosis not present

## 2020-09-18 DIAGNOSIS — E871 Hypo-osmolality and hyponatremia: Secondary | ICD-10-CM | POA: Diagnosis not present

## 2020-09-18 DIAGNOSIS — J9601 Acute respiratory failure with hypoxia: Secondary | ICD-10-CM | POA: Diagnosis not present

## 2020-09-18 LAB — POCT I-STAT 7, (LYTES, BLD GAS, ICA,H+H)
Acid-Base Excess: 0 mmol/L (ref 0.0–2.0)
Acid-Base Excess: 0 mmol/L (ref 0.0–2.0)
Acid-base deficit: 2 mmol/L (ref 0.0–2.0)
Bicarbonate: 23.2 mmol/L (ref 20.0–28.0)
Bicarbonate: 24.2 mmol/L (ref 20.0–28.0)
Bicarbonate: 24.9 mmol/L (ref 20.0–28.0)
Calcium, Ion: 1.21 mmol/L (ref 1.15–1.40)
Calcium, Ion: 1.26 mmol/L (ref 1.15–1.40)
Calcium, Ion: 1.26 mmol/L (ref 1.15–1.40)
HCT: 23 % — ABNORMAL LOW (ref 39.0–52.0)
HCT: 26 % — ABNORMAL LOW (ref 39.0–52.0)
HCT: 25 % — ABNORMAL LOW (ref 39.0–52.0)
Hemoglobin: 7.8 g/dL — ABNORMAL LOW (ref 13.0–17.0)
Hemoglobin: 8.8 g/dL — ABNORMAL LOW (ref 13.0–17.0)
Hemoglobin: 8.5 g/dL — ABNORMAL LOW (ref 13.0–17.0)
O2 Saturation: 89 %
O2 Saturation: 94 %
O2 Saturation: 88 %
Patient temperature: 36.6
Patient temperature: 36.4
Patient temperature: 36.6
Potassium: 5.1 mmol/L (ref 3.5–5.1)
Potassium: 4.5 mmol/L (ref 3.5–5.1)
Potassium: 4.2 mmol/L (ref 3.5–5.1)
Sodium: 152 mmol/L — ABNORMAL HIGH (ref 135–145)
Sodium: 151 mmol/L — ABNORMAL HIGH (ref 135–145)
Sodium: 151 mmol/L — ABNORMAL HIGH (ref 135–145)
TCO2: 24 mmol/L (ref 22–32)
TCO2: 25 mmol/L (ref 22–32)
TCO2: 26 mmol/L (ref 22–32)
pCO2 arterial: 40.4 mmHg (ref 32.0–48.0)
pCO2 arterial: 37.6 mmHg (ref 32.0–48.0)
pCO2 arterial: 39.9 mmHg (ref 32.0–48.0)
pH, Arterial: 7.364 (ref 7.350–7.450)
pH, Arterial: 7.414 (ref 7.350–7.450)
pH, Arterial: 7.402 (ref 7.350–7.450)
pO2, Arterial: 58 mmHg — ABNORMAL LOW (ref 83.0–108.0)
pO2, Arterial: 69 mmHg — ABNORMAL LOW (ref 83.0–108.0)
pO2, Arterial: 53 mmHg — ABNORMAL LOW (ref 83.0–108.0)

## 2020-09-18 LAB — CBC
HCT: 27.5 % — ABNORMAL LOW (ref 39.0–52.0)
HCT: 28 % — ABNORMAL LOW (ref 39.0–52.0)
Hemoglobin: 8.8 g/dL — ABNORMAL LOW (ref 13.0–17.0)
Hemoglobin: 9 g/dL — ABNORMAL LOW (ref 13.0–17.0)
MCH: 30.8 pg (ref 26.0–34.0)
MCH: 30.5 pg (ref 26.0–34.0)
MCHC: 32 g/dL (ref 30.0–36.0)
MCHC: 32.1 g/dL (ref 30.0–36.0)
MCV: 96.2 fL (ref 80.0–100.0)
MCV: 94.9 fL (ref 80.0–100.0)
Platelets: 122 10*3/uL — ABNORMAL LOW (ref 150–400)
Platelets: 135 10*3/uL — ABNORMAL LOW (ref 150–400)
RBC: 2.86 MIL/uL — ABNORMAL LOW (ref 4.22–5.81)
RBC: 2.95 MIL/uL — ABNORMAL LOW (ref 4.22–5.81)
RDW: 18 % — ABNORMAL HIGH (ref 11.5–15.5)
RDW: 18.7 % — ABNORMAL HIGH (ref 11.5–15.5)
WBC: 12.6 10*3/uL — ABNORMAL HIGH (ref 4.0–10.5)
WBC: 13.5 10*3/uL — ABNORMAL HIGH (ref 4.0–10.5)
nRBC: 1 % — ABNORMAL HIGH (ref 0.0–0.2)
nRBC: 2.3 % — ABNORMAL HIGH (ref 0.0–0.2)

## 2020-09-18 LAB — BPAM RBC
Blood Product Expiration Date: 202201112359
Blood Product Expiration Date: 202201112359
Blood Product Expiration Date: 202201142359
Blood Product Expiration Date: 202201142359
Blood Product Expiration Date: 202201162359
Blood Product Expiration Date: 202201182359
Blood Product Expiration Date: 202201182359
ISSUE DATE / TIME: 202112130944
ISSUE DATE / TIME: 202112180509
ISSUE DATE / TIME: 202112190044
ISSUE DATE / TIME: 202112192356
Unit Type and Rh: 5100
Unit Type and Rh: 5100
Unit Type and Rh: 5100
Unit Type and Rh: 5100
Unit Type and Rh: 5100
Unit Type and Rh: 5100
Unit Type and Rh: 5100

## 2020-09-18 LAB — HEPATIC FUNCTION PANEL
ALT: 50 U/L — ABNORMAL HIGH (ref 0–44)
AST: 39 U/L (ref 15–41)
Albumin: 2.8 g/dL — ABNORMAL LOW (ref 3.5–5.0)
Alkaline Phosphatase: 87 U/L (ref 38–126)
Bilirubin, Direct: 1.6 mg/dL — ABNORMAL HIGH (ref 0.0–0.2)
Indirect Bilirubin: 1.1 mg/dL — ABNORMAL HIGH (ref 0.3–0.9)
Total Bilirubin: 2.7 mg/dL — ABNORMAL HIGH (ref 0.3–1.2)
Total Protein: 3.9 g/dL — ABNORMAL LOW (ref 6.5–8.1)

## 2020-09-18 LAB — TYPE AND SCREEN
ABO/RH(D): O POS
Antibody Screen: NEGATIVE
Unit division: 0
Unit division: 0
Unit division: 0
Unit division: 0
Unit division: 0
Unit division: 0
Unit division: 0

## 2020-09-18 LAB — GLUCOSE, CAPILLARY
Glucose-Capillary: 102 mg/dL — ABNORMAL HIGH (ref 70–99)
Glucose-Capillary: 127 mg/dL — ABNORMAL HIGH (ref 70–99)
Glucose-Capillary: 136 mg/dL — ABNORMAL HIGH (ref 70–99)
Glucose-Capillary: 151 mg/dL — ABNORMAL HIGH (ref 70–99)
Glucose-Capillary: 171 mg/dL — ABNORMAL HIGH (ref 70–99)
Glucose-Capillary: 176 mg/dL — ABNORMAL HIGH (ref 70–99)

## 2020-09-18 LAB — CULTURE, BAL-QUANTITATIVE W GRAM STAIN: Culture: 2000 — AB

## 2020-09-18 LAB — APTT
aPTT: 51 seconds — ABNORMAL HIGH (ref 24–36)
aPTT: 51 seconds — ABNORMAL HIGH (ref 24–36)

## 2020-09-18 LAB — GLOBAL TEG PANEL
CFF Max Amplitude: 14.6 mm — ABNORMAL LOW (ref 15–32)
CK with Heparinase (R): 10.5 min — ABNORMAL HIGH (ref 4.3–8.3)
Citrated Functional Fibrinogen: 266.4 mg/dL — ABNORMAL LOW (ref 278–581)
Citrated Kaolin (K): 2.7 min — ABNORMAL HIGH (ref 0.8–2.1)
Citrated Kaolin (MA): 43.4 mm — ABNORMAL LOW (ref 52–69)
Citrated Kaolin (R): 12.2 min — ABNORMAL HIGH (ref 4.6–9.1)
Citrated Kaolin Angle: 58.4 deg — ABNORMAL LOW (ref 63–78)
Citrated Rapid TEG (MA): 49.7 mm — ABNORMAL LOW (ref 52–70)

## 2020-09-18 LAB — BASIC METABOLIC PANEL
Anion gap: 6 (ref 5–15)
Anion gap: 9 (ref 5–15)
BUN: 95 mg/dL — ABNORMAL HIGH (ref 6–20)
BUN: 92 mg/dL — ABNORMAL HIGH (ref 6–20)
CO2: 23 mmol/L (ref 22–32)
CO2: 22 mmol/L (ref 22–32)
Calcium: 7.8 mg/dL — ABNORMAL LOW (ref 8.9–10.3)
Calcium: 8.1 mg/dL — ABNORMAL LOW (ref 8.9–10.3)
Chloride: 119 mmol/L — ABNORMAL HIGH (ref 98–111)
Chloride: 119 mmol/L — ABNORMAL HIGH (ref 98–111)
Creatinine, Ser: 1.35 mg/dL — ABNORMAL HIGH (ref 0.61–1.24)
Creatinine, Ser: 1.36 mg/dL — ABNORMAL HIGH (ref 0.61–1.24)
GFR, Estimated: >60 mL/min (ref >60)
GFR, Estimated: 60 mL/min — ABNORMAL LOW (ref >60)
Glucose, Bld: 190 mg/dL — ABNORMAL HIGH (ref 70–99)
Glucose, Bld: 128 mg/dL — ABNORMAL HIGH (ref 70–99)
Potassium: 5.2 mmol/L — ABNORMAL HIGH (ref 3.5–5.1)
Potassium: 4.5 mmol/L (ref 3.5–5.1)
Sodium: 148 mmol/L — ABNORMAL HIGH (ref 135–145)
Sodium: 150 mmol/L — ABNORMAL HIGH (ref 135–145)

## 2020-09-18 LAB — LACTIC ACID, PLASMA
Lactic Acid, Venous: 1.6 mmol/L (ref 0.5–1.9)
Lactic Acid, Venous: 1.4 mmol/L (ref 0.5–1.9)

## 2020-09-18 LAB — LACTATE DEHYDROGENASE: LDH: 434 U/L — ABNORMAL HIGH (ref 98–192)

## 2020-09-18 LAB — PROTIME-INR
INR: 2.5 — ABNORMAL HIGH (ref 0.8–1.2)
Prothrombin Time: 26.1 seconds — ABNORMAL HIGH (ref 11.4–15.2)

## 2020-09-18 LAB — VANCOMYCIN, TROUGH: Vancomycin Tr: 20 ug/mL (ref 15–20)

## 2020-09-18 LAB — CMV DNA, QUANTITATIVE, PCR
CMV DNA Quant: 305 IU/mL
Log10 CMV Qn DNA Pl: 2.484 log10 IU/mL

## 2020-09-18 LAB — FIBRINOGEN: Fibrinogen: 80 mg/dL — CL (ref 210–475)

## 2020-09-18 MED ORDER — CLONIDINE HCL 0.2 MG PO TABS
0.2000 mg | ORAL_TABLET | Freq: Three times a day (TID) | ORAL | Status: DC
  Administered 2020-09-18 – 2020-10-02 (×38): 0.2 mg
  Filled 2020-09-18 (×38): qty 1

## 2020-09-18 MED ORDER — HYDROCHLOROTHIAZIDE 25 MG PO TABS
25.0000 mg | ORAL_TABLET | Freq: Every day | ORAL | Status: DC
  Administered 2020-09-18 – 2020-09-20 (×2): 25 mg via ORAL
  Filled 2020-09-18 (×2): qty 1

## 2020-09-18 MED ORDER — SODIUM ZIRCONIUM CYCLOSILICATE 5 G PO PACK
5.0000 g | PACK | Freq: Once | ORAL | Status: AC
  Administered 2020-09-18: 10:00:00 5 g
  Filled 2020-09-18: qty 1

## 2020-09-18 MED ORDER — FREE WATER
200.0000 mL | Status: DC
  Administered 2020-09-18 – 2020-09-19 (×6): 200 mL

## 2020-09-18 MED ORDER — METOPROLOL TARTRATE 50 MG PO TABS
50.0000 mg | ORAL_TABLET | Freq: Three times a day (TID) | ORAL | Status: DC
  Administered 2020-09-18 – 2020-09-23 (×12): 50 mg
  Filled 2020-09-18 (×13): qty 1

## 2020-09-18 MED ORDER — FREE WATER: 150.0000 mL | Status: DC

## 2020-09-18 MED ORDER — LIP MEDEX EX OINT
1.0000 "application " | TOPICAL_OINTMENT | CUTANEOUS | Status: DC | PRN
  Filled 2020-09-18: qty 7

## 2020-09-18 MED ORDER — GERHARDT'S BUTT CREAM
1.0000 "application " | TOPICAL_CREAM | CUTANEOUS | Status: DC | PRN
  Administered 2020-09-27 – 2020-10-09 (×3): 1 via TOPICAL
  Filled 2020-09-18 (×2): qty 1

## 2020-09-18 MED ORDER — METHYLPREDNISOLONE SODIUM SUCC 125 MG IJ SOLR
60.0000 mg | Freq: Two times a day (BID) | INTRAMUSCULAR | Status: DC
  Administered 2020-09-18 – 2020-09-19 (×3): 60 mg via INTRAVENOUS
  Filled 2020-09-18 (×3): qty 2

## 2020-09-18 NOTE — Progress Notes (Signed)
Assisted tele visit to patient with family member.  Fredna Stricker McEachran, RN  

## 2020-09-18 NOTE — Progress Notes (Signed)
Patient ID: Donald Rowe, male   DOB: 06/16/1961, 59 y.o.   MRN: [id number] S: Pt given 1 unit PRBC overnight and off of levophed. O:BP (!) 181/66 Comment (BP Location): a-line  Pulse 88   Temp 97.7 F (36.5 C) (Core)   Resp 12   Ht $R'5\' 9"'YV$  (1.753 m)   Wt 122.6 kg   SpO2 96%   BMI 39.91 kg/m   Intake/Output Summary (Last 24 hours) at 09/18/2020 0952 Last data filed at 09/18/2020 0700 Gross per 24 hour  Intake 8692.73 ml  Output 5480 ml  Net 3212.73 ml   Intake/Output: I/O last 3 completed shifts: In: 11055.3 [I.V.:3790.5; Blood:695; NG/GT:5570; IV Piggyback:999.8] Out: 9562 [ZHYQM:5784; Emesis/NG output:30]  Intake/Output this shift:  No intake/output data recorded. Weight change: 0.2 kg Gen: on vent via trach CVS: no rub Resp: distant breath sounds Abd: obese, +BS, soft Ext: 2+ anasarca  Recent Labs  Lab 09/11/20 1615 09/11/20 1617 09/12/20 0352 09/12/20 0403 09/13/20 0304 09/13/20 0325 09/14/20 0352 09/14/20 0401 09/15/20 0312 09/15/20 0326 09/15/20 1618 09/15/20 2050 09/16/20 0357 09/16/20 0405 09/16/20 1145 09/16/20 1223 09/16/20 1615 09/16/20 1742 09/17/20 0454 09/17/20 0503 09/17/20 0627 09/17/20 1722 09/17/20 1724 09/17/20 2016 09/18/20 0312 09/18/20 0434  NA 146*   < > 147*   < > 147*   < > 146*   < > 149*   < > 149*   < > 148*   < > 149*   < > 149*   < > 148* 152* 151* 150* 150* 150* 148* 152*  K 3.9   < > 4.7   < > 5.0   < > 4.9   < > 4.8   < > 4.7   < > 5.0   < > 4.8   < > 5.2*   < > 4.9 5.0 5.0 5.1 5.0 5.1 5.2* 5.1  CL 111  --  111   < > 111   < > 113*   < > 117*  --  120*  --  118*  --  121*  --  122*  --  121*  --   --  121*  --   --  119*  --   CO2 25  --  28   < > 26   < > 23   < > 23  --  23  --  22  --  21*  --  20*  --  21*  --   --  22  --   --  23  --   GLUCOSE 142*  --  133*   < > 215*   < > 205*   < > 215*  --  175*  --  215*  --  174*  --  204*  --  180*  --   --  204*  --   --  190*  --   BUN 51*  --  55*   < > 75*   < > 73*   <  > 82*  --  79*  --  84*  --  80*  --  79*  --  87*  --   --  92*  --   --  95*  --   CREATININE 1.16  --  1.27*   < > 1.61*   < > 1.46*   < > 1.55*  --  1.41*  --  1.35*  --  1.26*  --  1.32*  --  1.30*  --   --  1.37*  --   --  1.35*  --   ALBUMIN  --   --  1.9*  --  1.9*  --  3.1*  --  2.7*  --   --   --  3.0*  --   --   --   --   --  3.3*  --   --   --   --   --  2.8*  --   CALCIUM 7.7*  --  8.0*   < > 8.4*   < > 7.9*   < > 8.0*  --  7.5*  --  7.8*  --  7.4*  --  7.6*  --  7.7*  --   --  7.7*  --   --  7.8*  --   PHOS 3.4  --   --   --   --   --   --   --   --   --   --   --   --   --   --   --   --   --   --   --   --   --   --   --   --   --   AST  --   --  52*  --  56*  --  38  --  44*  --   --   --  31  --   --   --   --   --  40  --   --   --   --   --  39  --   ALT  --   --  36  --  36  --  27  --  37  --   --   --  27  --   --   --   --   --  39  --   --   --   --   --  50*  --    < > = values in this interval not displayed.   Liver Function Tests: Recent Labs  Lab 09/16/20 0357 09/17/20 0454 09/18/20 0312  AST 31 40 39  ALT 27 39 50*  ALKPHOS 73 70 87  BILITOT 2.4* 2.8* 2.7*  PROT 4.0* 4.2* 3.9*  ALBUMIN 3.0* 3.3* 2.8*   No results for input(s): LIPASE, AMYLASE in the last 168 hours. Recent Labs  Lab 09/14/20 0821  AMMONIA 48*   CBC: Recent Labs  Lab 09/16/20 1615 09/16/20 1742 09/17/20 0454 09/17/20 0503 09/17/20 1722 09/17/20 1724 09/17/20 2241 09/18/20 0434 09/18/20 0436  WBC 14.4*  --  14.8*  --  12.9*  --  11.9*  --  12.6*  HGB 8.2*   < > 8.8*   < > 8.2*   < > 7.5* 7.8* 8.8*  HCT 26.7*   < > 28.1*   < > 25.5*   < > 23.6* 23.0* 27.5*  MCV 97.4  --  97.9  --  96.2  --  96.7  --  96.2  PLT 122*  --  123*  --  122*  --  112*  --  122*   < > = values in this interval not displayed.   Cardiac Enzymes: No results for input(s): CKTOTAL, CKMB, CKMBINDEX, TROPONINI in the last 168 hours. CBG: Recent Labs  Lab 09/17/20 1553 09/17/20 2010 09/18/20 0018  09/18/20 0319 09/18/20 0912  GLUCAP 185* 193* 176* 171* 151*  Iron Studies: No results for input(s): IRON, TIBC, TRANSFERRIN, FERRITIN in the last 72 hours. Studies/Results: DG CHEST PORT 1 VIEW  Result Date: 09/18/2020 CLINICAL DATA:  Tracheostomy.  ECMO.  Recent COVID. EXAM: PORTABLE CHEST 1 VIEW COMPARISON:  09/17/2020. FINDINGS: Tracheostomy tube, left IJ line, feeding tube, right PICC line, ECMO device in stable position. Heart size normal. Diffuse dense bilateral pulmonary infiltrates are again noted. Very slight improvement in aeration may be present. Bilateral layering pleural effusions again cannot be excluded. No pneumothorax. IMPRESSION: 1. Lines and tubes in stable position. 2. Diffuse dense bilateral pulmonary infiltrates are again noted. Very slight improvement in aeration may be present. Bilateral layering pleural effusions again cannot be excluded. Electronically Signed   By: Marcello Moores  Register   On: 09/18/2020 06:39   DG CHEST PORT 1 VIEW  Result Date: 09/17/2020 CLINICAL DATA:  Acute respiratory failure. Endotracheally intubated. EXAM: PORTABLE CHEST 1 VIEW COMPARISON:  09/16/2020 FINDINGS: Support lines and tubes in appropriate position. No pneumothorax visualized. Severe bilateral diffuse pulmonary airspace disease shows no significant change. Probable layering bilateral pleural effusions for noted in the upper lung zones. : No significant change in severe bilateral diffuse pulmonary airspace disease and probable layering pleural effusions. Electronically Signed   By: Marlaine Hind M.D.   On: 09/17/2020 11:06   . amLODipine  10 mg Per Tube Daily  . aspirin  81 mg Per Tube Daily  . calcium gluconate  2 g Intravenous Once  . chlorhexidine gluconate (MEDLINE KIT)  15 mL Mouth Rinse BID  . Chlorhexidine Gluconate Cloth  6 each Topical Daily  . clonazePAM  2 mg Per Tube BID  . cloNIDine  0.2 mg Per Tube Q8H  . docusate  100 mg Per Tube BID  . feeding supplement (PROSource TF)   45 mL Per Tube TID  . fiber  1 packet Per Tube BID  . free water  150 mL Per Tube Q4H  . furosemide  40 mg Intravenous BID  . hydrALAZINE  25 mg Per Tube Q8H  . insulin aspart  10 Units Subcutaneous Q4H  . insulin aspart  2-6 Units Subcutaneous Q4H  . insulin detemir  38 Units Subcutaneous Q12H  . lactulose  20 g Per Tube BID  . levothyroxine  50 mcg Per Tube Q0600  . mouth rinse  15 mL Mouth Rinse 10 times per day  . methylPREDNISolone (SOLU-MEDROL) injection  60 mg Intravenous Q12H  . metoprolol tartrate  50 mg Per Tube Q8H  . oxyCODONE  15 mg Per Tube Q6H  . pantoprazole sodium  40 mg Per Tube Daily  . pravastatin  40 mg Per Tube q1800  . QUEtiapine  100 mg Per Tube BID  . sodium chloride flush  10-40 mL Intracatheter Q12H  . sodium zirconium cyclosilicate  5 g Per Tube Once    BMET    Component Value Date/Time   NA 152 (H) 09/18/2020 0434   K 5.1 09/18/2020 0434   CL 119 (H) 09/18/2020 0312   CO2 23 09/18/2020 0312   GLUCOSE 190 (H) 09/18/2020 0312   BUN 95 (H) 09/18/2020 0312   CREATININE 1.35 (H) 09/18/2020 0312   CALCIUM 7.8 (L) 09/18/2020 0312   GFRNONAA >60 09/18/2020 0312   CBC    Component Value Date/Time   WBC 12.6 (H) 09/18/2020 0436   RBC 2.86 (L) 09/18/2020 0436   HGB 8.8 (L) 09/18/2020 0436   HCT 27.5 (L) 09/18/2020 0436   PLT 122 (L) 09/18/2020 1884  MCV 96.2 09/18/2020 0436   MCH 30.8 09/18/2020 0436   MCHC 32.0 09/18/2020 0436   RDW 18.0 (H) 09/18/2020 0436   LYMPHSABS 0.4 (L) 09/07/2020 0438   MONOABS 0.5 09/07/2020 0438   EOSABS 0.0 09/07/2020 0438   BASOSABS 0.0 09/07/2020 0438     Assessment/Plan:  1. AKI- presumably due to ischemic ATN associted with covid-19 infection.  Scr stable at 1.3 and remains non-oliguric. Azotemia due to corticosteroids, tube feeds, diuretics, and hemolysis 2. 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  Continue to  follow LDH and H/H. 1. Had hypotension during plasmapheresis on 09/16/20 requiring neo-synephrine.  2. Right femoral HD catheter placed 09/13/20 by PCCM. 3. For next PLEX tomorrow (4/5).  LDH remains stable at 434, down from peak of 1569 on 09/11/20. 3. Acute hypoxic respiratory failure- secondary to covid-19 infection on VV ECMO  1. S/p trach 09/14/20 4. Hypernatremia- will need more free water and follow.  5. 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 HCAP/MRSA pneumonitis/tracheitis- on IV vancomycin.  Donetta Potts, MD [employer] [phone]

## 2020-09-18 NOTE — Progress Notes (Signed)
ANTICOAGULATION CONSULT NOTE  Pharmacy Consult for bivalirudin Indication: ECMO + bilateral DVTs   Recent Labs    09/16/20 0357 09/16/20 0405 09/17/20 0454 09/17/20 0503 09/17/20 1722 09/17/20 1724 09/17/20 2241 09/18/20 0312 09/18/20 0434 09/18/20 0436 09/18/20 1619 09/18/20 1631  HGB 7.8*   < > 8.8*   < > 8.2*   < > 7.5*  --    < > 8.8* 9.0* 8.8*  HCT 25.0*   < > 28.1*   < > 25.5*   < > 23.6*  --    < > 27.5* 28.0* 26.0*  PLT 122*   < > 123*  --  122*  --  112*  --   --  122* 135*  --   APTT 64*   < > 56*  --  69*  --   --  51*  --   --  51*  --   LABPROT 32.9*  --  29.8*  --   --   --   --  26.1*  --   --   --   --   INR 3.4*  --  3.0*  --   --   --   --  2.5*  --   --   --   --   CREATININE 1.35*   < > 1.30*  --  1.37*  --   --  1.35*  --   --  1.36*  --    < > = values in this interval not displayed.    Estimated Creatinine Clearance: 75.7 mL/min (A) (by C-G formula based on SCr of 1.36 mg/dL (H)).   Assessment: 26 yom unvaccinated presenting with severe COVID ARDs, intubated on 12/6, continued to have ventilation issues despite NMB and proning - started on VV ECMO on 12/8.  D-dimer >20 > now improved 14, found to have bilateral DVTs.  Given significant bleeding issues from trach, targeting lower aPTT goal of 50-55 seconds after discussion with ECMO team.   aPTT is now at goal at 51.  No overt bleeding or complications noted.  Goal of Therapy:  aPTT 50-55 seconds Monitor platelets by anticoagulation protocol: Yes   Plan:  Continue bivalirudin at 0.11 mg/kg/hr (dosing wt 103.6 kg) Check q12h aPTT Follow bleeding, CBC  Jeanella Cara, PharmD, [facility] Clinical Pharmacist Please see AMION for all Pharmacists' Contact Phone Numbers 09/18/2020, 6:21 PM

## 2020-09-18 NOTE — Progress Notes (Signed)
Physical Therapy Treatment Patient Details Name: Donald Rowe MRN: [id number] DOB: 1961-08-04 Today's Date: 09/18/2020    History of Present Illness 59 y.o. male with a pertinent history of hypothyroidism on levothyroxine and hyperlipidemia who was diagnosed with Covid 11/27 and is not vaccinated.  With EMS SaO2 on RA 78%O2. ED patient was increased to 6 L oxygen satting about 92%,. CTA of the Chest was assessed and negative for pulmonary embolism but showed diffuse bilateral infiltrates. He was treated with IV remdesivir, steroids and baricitinib.  Initially, he was also treated with antibiotics but these were stopped on 12/6 with a low procalcitonin. 12/07 intubated, central line, A line, 12/08 ECMO cannulation, cortrak, attempted extubation 12/9 failed reintubated. Trach placed 12/16.    PT Comments    Pt admitted with above diagnosis. Pt was able to tolerate tilt for 20 minutes at 35-40 degrees. Pt VSS with sats 93-95%.  No response from pt during tilt.  Pt continues to be sedated.  Pt currently with functional limitations due to balance and endurance deficits. Pt will benefit from skilled PT to increase their independence and safety with mobility to allow discharge to the venue listed below.     Follow Up Recommendations  CIR     Equipment Recommendations  Other (comment) (TBD)    Recommendations for Other Services       Precautions / Restrictions Precautions Precaution Comments: COVID+, ECMO, cortrak, intubated,  condom cath Restrictions Weight Bearing Restrictions: No    Mobility  Bed Mobility               General bed mobility comments: Continued: Total A +2 for reposition in bed. Use of kreg tilt bed for semi standing. +2 to manage line and safety.  Transfers                 General transfer comment: Continued to utilized tilt bed to come to semi-standing position  Ambulation/Gait                 Stairs             Wheelchair Mobility     Modified Rankin (Stroke Patients Only)       Balance Overall balance assessment: Needs assistance         Standing balance support: No upper extremity supported;During functional activity Standing balance-Leahy Scale: Fair Standing balance comment: Tilt pt in Kreg bed for 20 minutes to 40 degrees. Pt tolerated well overall.                            Cognition Arousal/Alertness: Suspect due to medications Behavior During Therapy: Flat affect Overall Cognitive Status: Difficult to assess                                 General Comments: RN in hopes that with movement with therapy pt will begin to rouse, however despite best efforts and various stimuli      Exercises General Exercises - Lower Extremity Hip Flexion/Marching: PROM;Both;5 reps;Supine    General Comments General comments (skin integrity, edema, etc.): RN and ECMO specialist present throughout session, SaO2 93-95%, HR stable      Pertinent Vitals/Pain Faces Pain Scale: No hurt Pain Location: no change in response to nail bed pressure or in elevated position Pain Descriptors / Indicators: Grimacing    Home Living  Prior Function            PT Goals (current goals can now be found in the care plan section) Progress towards PT goals: Not progressing toward goals - comment (Sedated but does tolerate tilts)    Frequency    Min 3X/week      PT Plan Current plan remains appropriate    Co-evaluation              AM-PAC PT "6 Clicks" Mobility   Outcome Measure  Help needed turning from your back to your side while in a flat bed without using bedrails?: Total Help needed moving from lying on your back to sitting on the side of a flat bed without using bedrails?: Total Help needed moving to and from a bed to a chair (including a wheelchair)?: Total Help needed standing up from a chair using your arms (e.g., wheelchair or bedside chair)?:  Total Help needed to walk in hospital room?: Total Help needed climbing 3-5 steps with a railing? : Total 6 Click Score: 6    End of Session Equipment Utilized During Treatment: Other (comment) (Kreg bed) Activity Tolerance: Patient tolerated treatment well Patient left: in bed;with nursing/sitter in room;with call bell/phone within reach Nurse Communication: Other (comment) (tilt schedule goal 3x/day) PT Visit Diagnosis: Muscle weakness (generalized) (M62.81);Difficulty in walking, not elsewhere classified (R26.2)     Time: 1191-4782 PT Time Calculation (min) (ACUTE ONLY): 39 min  Charges:  $Therapeutic Exercise: 8-22 mins $Therapeutic Activity: 23-37 mins                     Chirsty Armistead W,PT Acute Rehabilitation Services Pager:  [phone]  Office:  [phone]     Berline Lopes 09/18/2020, 2:47 PM

## 2020-09-18 NOTE — Procedures (Signed)
Extracorporeal support note     ECLS cannulation: 09-08-2020 Last circuit change: 09/11/2020 Indication: COVID ARDS   Configuration: VV, RIJ 32Fr Crescent   Pump speed: 3800 Pump flow: 5.1 Pump used: Cardiohelp   Sweep gas: 100%, 13LPM   Circuit check: 1 small clot in corner Anticoagulant: bivalirudin Anticoagulation targets: PTT 50-55    Changes in support: Received PLEX last 12/18; defer to nephrology for next tx. Off norepi. Continue current support.  Minimize sedation as able- cough has been limiting factor.  Stopping vanc today and merrem tomorrow and follow closely. Should have had adequate therapy for mrsa cx from sputum as well as long course of cefepime converted to merrem ~5 days ago. Decrease free water. Fibrinogen <80   Anticipated goals/duration of support: bridge to recovery   Discussed care with ECMO team, Palliative Care, RN, pharmacy.  Briant Sites, DO 09/18/20 8:25 AM [facility]

## 2020-09-18 NOTE — Progress Notes (Addendum)
Patient ID: Donald Rowe, male   DOB: 1961-01-05, 59 y.o.   MRN: [id number]    Advanced Heart Failure Rounding Note   Subjective:    12/8 Cannulated for VV ECMO - 32 FR  RIJ Crescent 12/9 Extubated/Reintubated for altered mental status 12/13 Circuit change for elevated LDH 12/15 PLEX started 12/16 Tracheostomy  Got 1u RBCs overnight. Bleeding around trach has slowed with lower PTT goal. BP remains quite labile. Systolics in the 254-270W yesterday am but bropped BP overnight and started on NE. Now off. Got 2 doses of IV lasix yesterday. Over 8L of urine out. Creatinine stable. BUN continues to climb.  Insulin adjusted for hyperglycemia.   Sweep remains at 13.   CXR with diffuse infiltrates bilaterally. Improved today after diuresis. Personally reviewed   ECMO   Speed 3800 Flow 5.23L Sweep 13 dP 24 pVen  -99  ABG: 7.36/40/58/89% Hgb 8.8 LDH 1295  -> PLEX #1 -> 695 -> 916 -> PLEX #2  -> 502 -> PLEX #3 -> 417 -> 434 Lactic acid 1.6 PTT 51  Vent 50% TVs 100-200s   Objective:   Weight Range:  Vital Signs:   Temp:  [97.34 F (36.3 C)-98.06 F (36.7 C)] 97.7 F (36.5 C) (12/20 0800) Pulse Rate:  [84-103] 88 (12/20 0800) Resp:  [0-25] 12 (12/20 0800) BP: (122-181)/(52-66) 181/66 (12/20 0800) SpO2:  [93 %-100 %] 96 % (12/20 0800) Arterial Line BP: (78-195)/(38-67) 181/66 (12/20 0800) FiO2 (%):  [50 %] 50 % (12/20 0828) Weight:  [122.6 kg] 122.6 kg (12/20 0100) Last BM Date: 09/17/20  Weight change: Filed Weights   09/16/20 0030 09/17/20 0445 09/18/20 0100  Weight: 122.5 kg 122.4 kg 122.6 kg    Intake/Output:   Intake/Output Summary (Last 24 hours) at 09/18/2020 0849 Last data filed at 09/18/2020 2376 Gross per 24 hour  Intake 8288.82 ml  Output 5855 ml  Net 2433.82 ml     Physical Exam: General: Sedated on vent.  HEENT: normal  Neck: supple. + RIJ ECMO cannula site ok. + trach. Carotids 2+ bilat; no bruits. No lymphadenopathy or thryomegaly  appreciated. Cor: PMI nondisplaced. Regular rate & rhythm. No rubs, gallops or murmurs. Lungs: coarse Abdomen: obese soft, nontender, nondistended. No hepatosplenomegaly. No bruits or masses. Good bowel sounds. Extremities: no cyanosis, clubbing, rash, 2+ edema Neuro: alert & orientedx3, cranial nerves grossly intact. moves all 4 extremities w/o difficulty. Affect pleasant   Telemetry: sinus 80-100 Personally reviewed    Labs: Basic Metabolic Panel: Recent Labs  Lab 09/11/20 1615 09/11/20 1617 09/16/20 1145 09/16/20 1223 09/16/20 1615 09/16/20 1742 09/17/20 0454 09/17/20 0503 09/17/20 1722 09/17/20 1724 09/17/20 2016 09/18/20 0312 09/18/20 0434  NA 146*   < > 149*   < > 149*   < > 148*   < > 150* 150* 150* 148* 152*  K 3.9   < > 4.8   < > 5.2*   < > 4.9   < > 5.1 5.0 5.1 5.2* 5.1  CL 111   < > 121*  --  122*  --  121*  --  121*  --   --  119*  --   CO2 25   < > 21*  --  20*  --  21*  --  22  --   --  23  --   GLUCOSE 142*   < > 174*  --  204*  --  180*  --  204*  --   --  190*  --  BUN 51*   < > 80*  --  79*  --  87*  --  92*  --   --  95*  --   CREATININE 1.16   < > 1.26*  --  1.32*  --  1.30*  --  1.37*  --   --  1.35*  --   CALCIUM 7.7*   < > 7.4*  --  7.6*  --  7.7*  --  7.7*  --   --  7.8*  --   PHOS 3.4  --   --   --   --   --   --   --   --   --   --   --   --    < > = values in this interval not displayed.    Liver Function Tests: Recent Labs  Lab 09/14/20 0352 09/15/20 0312 09/16/20 0357 09/17/20 0454 09/18/20 0312  AST 38 44* 31 40 39  ALT 27 37 27 39 50*  ALKPHOS 102 137* 73 70 87  BILITOT 2.2* 2.5* 2.4* 2.8* 2.7*  PROT 4.5* 4.5* 4.0* 4.2* 3.9*  ALBUMIN 3.1* 2.7* 3.0* 3.3* 2.8*   No results for input(s): LIPASE, AMYLASE in the last 168 hours. Recent Labs  Lab 09/14/20 0821  AMMONIA 48*    CBC: Recent Labs  Lab 09/16/20 1615 09/16/20 1742 09/17/20 0454 09/17/20 0503 09/17/20 1722 09/17/20 1724 09/17/20 2016 09/17/20 2241  09/18/20 0434 09/18/20 0436  WBC 14.4*  --  14.8*  --  12.9*  --   --  11.9*  --  12.6*  HGB 8.2*   < > 8.8*   < > 8.2* 7.1* 7.8* 7.5* 7.8* 8.8*  HCT 26.7*   < > 28.1*   < > 25.5* 21.0* 23.0* 23.6* 23.0* 27.5*  MCV 97.4  --  97.9  --  96.2  --   --  96.7  --  96.2  PLT 122*  --  123*  --  122*  --   --  112*  --  122*   < > = values in this interval not displayed.    Cardiac Enzymes: No results for input(s): CKTOTAL, CKMB, CKMBINDEX, TROPONINI in the last 168 hours.  BNP: BNP (last 3 results) No results for input(s): BNP in the last 8760 hours.  ProBNP (last 3 results) No results for input(s): PROBNP in the last 8760 hours.    Other results:  Imaging: DG CHEST PORT 1 VIEW  Result Date: 09/18/2020 CLINICAL DATA:  Tracheostomy.  ECMO.  Recent COVID. EXAM: PORTABLE CHEST 1 VIEW COMPARISON:  09/17/2020. FINDINGS: Tracheostomy tube, left IJ line, feeding tube, right PICC line, ECMO device in stable position. Heart size normal. Diffuse dense bilateral pulmonary infiltrates are again noted. Very slight improvement in aeration may be present. Bilateral layering pleural effusions again cannot be excluded. No pneumothorax. IMPRESSION: 1. Lines and tubes in stable position. 2. Diffuse dense bilateral pulmonary infiltrates are again noted. Very slight improvement in aeration may be present. Bilateral layering pleural effusions again cannot be excluded. Electronically Signed   By: Marcello Moores  Register   On: 09/18/2020 06:39   DG CHEST PORT 1 VIEW  Result Date: 09/17/2020 CLINICAL DATA:  Acute respiratory failure. Endotracheally intubated. EXAM: PORTABLE CHEST 1 VIEW COMPARISON:  09/16/2020 FINDINGS: Support lines and tubes in appropriate position. No pneumothorax visualized. Severe bilateral diffuse pulmonary airspace disease shows no significant change. Probable layering bilateral pleural effusions for noted in the upper lung zones. : No  significant change in severe bilateral diffuse pulmonary  airspace disease and probable layering pleural effusions. Electronically Signed   By: Marlaine Hind M.D.   On: 09/17/2020 11:06     Medications:     Scheduled Medications: . amLODipine  10 mg Per Tube Daily  . aspirin  81 mg Per Tube Daily  . calcium gluconate  2 g Intravenous Once  . chlorhexidine gluconate (MEDLINE KIT)  15 mL Mouth Rinse BID  . Chlorhexidine Gluconate Cloth  6 each Topical Daily  . clonazePAM  2 mg Per Tube BID  . cloNIDine  0.2 mg Per Tube Q8H  . docusate  100 mg Per Tube BID  . feeding supplement (PROSource TF)  45 mL Per Tube TID  . fiber  1 packet Per Tube BID  . free water  200 mL Per Tube Q2H  . furosemide  40 mg Intravenous BID  . hydrALAZINE  25 mg Per Tube Q8H  . insulin aspart  10 Units Subcutaneous Q4H  . insulin aspart  2-6 Units Subcutaneous Q4H  . insulin detemir  38 Units Subcutaneous Q12H  . lactulose  20 g Per Tube BID  . levothyroxine  50 mcg Per Tube Q0600  . mouth rinse  15 mL Mouth Rinse 10 times per day  . methylPREDNISolone (SOLU-MEDROL) injection  60 mg Intravenous Q6H  . metoprolol tartrate  50 mg Per Tube Q8H  . oxyCODONE  15 mg Per Tube Q6H  . pantoprazole sodium  40 mg Per Tube Daily  . pravastatin  40 mg Per Tube q1800  . QUEtiapine  100 mg Per Tube BID  . sodium chloride flush  10-40 mL Intracatheter Q12H    Infusions: . sodium chloride Stopped (09/16/20 1146)  . sodium chloride 10 mL/hr at 09/17/20 1900  . albumin human    . albumin human 12.5 g (09/17/20 2222)  . bivalirudin (ANGIOMAX) infusion 0.5 mg/mL (Non-ACS indications) 0.11 mg/kg/hr (09/17/20 2248)  . citrate dextrose    . citrate dextrose    . clevidipine Stopped (09/17/20 1508)  . dexmedetomidine (PRECEDEX) IV infusion Stopped (09/16/20 1942)  . dextrose    . feeding supplement (PIVOT 1.5 CAL) 70 mL/hr at 09/17/20 1900  . fentaNYL infusion INTRAVENOUS 400 mcg/hr (09/18/20 0441)  . meropenem (MERREM) IV 2 g (09/18/20 0359)  . midazolam 10 mg/hr (09/18/20  0543)  . norepinephrine (LEVOPHED) Adult infusion 5 mcg/min (09/17/20 2238)  . phenylephrine (NEO-SYNEPHRINE) Adult infusion Stopped (09/16/20 1426)  . propofol (DIPRIVAN) infusion 10 mcg/kg/min (09/17/20 1900)  . vancomycin 1,000 mg (09/18/20 0435)    PRN Medications: sodium chloride, acetaminophen (TYLENOL) oral liquid 160 mg/5 mL, acetaminophen, acetaminophen, albumin human, albuterol, dextrose, diphenhydrAMINE, diphenhydrAMINE, fentaNYL, heparin, labetalol, LORazepam, ondansetron **OR** ondansetron (ZOFRAN) IV, polyethylene glycol, sodium chloride flush   Assessment/Plan:   1. Acute hypoxic respiratory failure/ARDS in setting of COVID-19 PNA - Cannulated for VV ECMO on 12/8 after failing intubation x 2 days - has complete remedisivir, baricitinib, Solumedrol.  - s/p trach 12/16 - underwent brisk diuresis yesterday - CXR improved today. Sweep remains at 13. Oxygenation marginal but slightly improved - evidence of worsening acidosis/infection 12/17 mero added. Improved - staph in tracheal aspirate -  vanc added 12/14 mero added 12/17 - circuit changed 12/13 for elevated LDH and lower flows. PLEX started 12/15. S/p PLEX #3 Saturday. Will get 4th run today LDH coming down (was stable yesterday off PLEX) - PTT goal turned down to 50-60 due to bleeding. Improved. Circuit looks ok  -  Driving pressure ok - volume status remains elevated.Continue diuresis. With rising BUN may need CVVHD via PLEX line  2. Bilateral DVT - remains on bival - Dosing d/w PharmD  3. DM2 - on insulin gtt. CBGs high. Insulin adjusted  4. Obesity -Body mass index is 33.73 kg/m.  5. F/E/N - Na 147 -> 153 -> 146 -> 149 -> 148 -> 148 -> 148 - Continue free water  6. HTN - Remains very labile  - Meds adjusted. May have to tolerate some permissive hypertension (especially during periods of agitation) to avoid hypotension. Meds adjusted  7. Dusky R thumb - improved  8. AKI - creatinine stable. BUN  climbing - markedly volume overloaded. - continue diruesis - may need dialysis for volume removal using PLEX line  9. Acute encephalopathy - remains agitated  - head CT ok 09/07/20  Plan discussed on multi-discplinary ECMO rounds with CCM, ECMO coordinator/specialist, PharmDs and RNs.  CRITICAL CARE Performed by: Glori Bickers  CRITICAL CARE Performed by: Glori Bickers  Total critical care time: 35 minutes  Critical care time was exclusive of separately billable procedures and treating other patients.  Critical care was necessary to treat or prevent imminent or life-threatening deterioration.  Critical care was time spent personally by me (independent of midlevel providers or residents) on the following activities: development of treatment plan with patient and/or surrogate as well as nursing, discussions with consultants, evaluation of patient's response to treatment, examination of patient, obtaining history from patient or surrogate, ordering and performing treatments and interventions, ordering and review of laboratory studies, ordering and review of radiographic studies, pulse oximetry and re-evaluation of patient's condition.    Length of Stay: 16   Glori Bickers MD 09/18/2020, 8:49 AM  Advanced Heart Failure Team Pager [phone] (M-F; [place])  Please contact [facility] for night-coverage after hours (4p -7a ) and weekends on [email or web address]

## 2020-09-18 NOTE — Progress Notes (Signed)
ANTICOAGULATION CONSULT NOTE  Pharmacy Consult for bivalirudin Indication: ECMO + bilateral DVTs   Recent Labs    09/16/20 0357 09/16/20 0405 09/17/20 0454 09/17/20 0503 09/17/20 1722 09/17/20 1724 09/17/20 2241 09/18/20 0312 09/18/20 0434 09/18/20 0436  HGB 7.8*   < > 8.8*   < > 8.2*   < > 7.5*  --  7.8* 8.8*  HCT 25.0*   < > 28.1*   < > 25.5*   < > 23.6*  --  23.0* 27.5*  PLT 122*   < > 123*  --  122*  --  112*  --   --  122*  APTT 64*   < > 56*  --  69*  --   --  51*  --   --   LABPROT 32.9*  --  29.8*  --   --   --   --  26.1*  --   --   INR 3.4*  --  3.0*  --   --   --   --  2.5*  --   --   CREATININE 1.35*   < > 1.30*  --  1.37*  --   --  1.35*  --   --    < > = values in this interval not displayed.    Estimated Creatinine Clearance: 76.3 mL/min (A) (by C-G formula based on SCr of 1.35 mg/dL (H)).   Assessment: 83 yom unvaccinated presenting with severe COVID ARDs, intubated on 12/6, continued to have ventilation issues despite NMB and proning - started on VV ECMO on 12/8.  D-dimer >20 > now improved 14, found to have bilateral DVTs.  Given significant bleeding issues from trach, targeting lower aPTT goal of 50-55 seconds after discussion with ECMO team.   aPTT is now at goal at 51.  No overt bleeding or complications noted.  Goal of Therapy:  aPTT 50-55 seconds Monitor platelets by anticoagulation protocol: Yes   Plan:  Continue bivalirudin at 0.11 mg/kg/hr (dosing wt 103.6 kg) Check q12h aPTT Follow bleeding, CBC   Reece Leader, Loura Back, BCPS, BCCP Clinical Pharmacist  09/18/2020 3:03 PM   [facility] pharmacy phone numbers are listed on [email or web address]

## 2020-09-18 NOTE — Plan of Care (Signed)
  Problem: Respiratory: Goal: Will maintain a patent airway Outcome: Progressing Goal: Complications related to the disease process, condition or treatment will be avoided or minimized Outcome: Progressing   Problem: Clinical Measurements: Goal: Diagnostic test results will improve Outcome: Progressing Goal: Signs and symptoms of infection will decrease Outcome: Progressing   Problem: Clinical Measurements: Goal: Ability to maintain clinical measurements within normal limits will improve Outcome: Progressing Goal: Will remain free from infection Outcome: Progressing Goal: Diagnostic test results will improve Outcome: Progressing Goal: Respiratory complications will improve Outcome: Progressing Goal: Cardiovascular complication will be avoided Outcome: Progressing

## 2020-09-18 NOTE — Progress Notes (Signed)
NAME:  Kieren Adkison, MRN:  [id number], DOB:  12-Oct-1960, LOS: 33 ADMISSION DATE:  09/25/2020, CONSULTATION DATE:  12/6 REFERRING MD:  Dr. Aileen Fass, CHIEF COMPLAINT:  SOB    Brief History   59 y/o M admitted 12/4 with 1 week hx of SOB, known COVID positive.  He is unvaccinated.  CTA chest negative for PE but demonstrated diffuse bilateral infiltrates.   History of present illness   59 y/o unvaccinated male, admitted 12/4 with a one week history of shortness of breath.    The patient began feeling poorly the day before Thanksgiving with fever, weakness and body aches. He tested positive for COVID on 12/27 prior to presentation.  He developed progressive shortness of breath.  In the ER, the patient was found to have saturations of 78%.  He was started on 6L O2 with initial improvement in saturations.  The patient was admitted per [facility]. CTA of the Chest was assessed and negative for pulmonary embolism but showed diffuse bilateral infiltrates. He was treated with IV remdesivir, steroids and baricitinib.  Initially, he was also treated with antibiotics but these were stopped on 12/6 with a low procalcitonin.  LE doppler negative for DVT. The patient elected to be DNR / no intubation.  He developed worsening respiratory failure with hypoxia requiring 60L flow 100% and PCCM consulted 12/6 for evaluation.   Past Medical History  Hypothyroidism  HLD   Significant Hospital Events/Procedures  12/04 Admit  12/06 PCCM consulted  12/07 Intubated, central line, a line 12/08 VV ECMO Cannulaton, cortrak 12/13 circuit change for hemolytic anemia 12/16 Tracheostomy  12/17 bronch w/ BAL  Consults:  PCCM, Heart failure, TCTS, ECMO  Significant Diagnostic Tests:   CTA Chest 12/4 >> extensive bilateral airspace disease, no large PE identified, limited study   LE Venous Duplex 12/4 >> negative for DVT bilaterally   LE venous duplex 12/8> BLE DVTs  Micro Data:  COVID 12/4 >> negative  Influenza A/B  12/4 >> negative  MRSA PCR 12/5 >> negative  BCx2 12/4 >> NG Trach aspirate 12/12> rare MRSA BAL 12/17> staph aureus Aspergillus Ag BAL 12/17>  Antimicrobials:  Azithromycin 12/5 >> 12/6  Ceftriaxone 12/5 >> 12/6  Cefepime 12/7>12/14 vanc 12/14> Meropenem 12/17>  Interim history/subjective:  O/N event: 1 packed red blood cell transfusion. Weaned off levophed  Mr.Wragg was examined and evaluated at bedside this am. He was noted to be minimally responsive.   Objective   Blood pressure (!) 151/52, pulse 90, temperature 98.06 F (36.7 C), resp. rate (!) 6, height _0  (1.753 m), weight 122.6 kg, SpO2 100 %.    Vent Mode: PCV FiO2 (%):  [50 %] 50 % Set Rate:  [12 bmp] 12 bmp PEEP:  [10 cmH20] 10 cmH20 Plateau Pressure:  [16 cmH20-29 cmH20] 24 cmH20   Intake/Output Summary (Last 24 hours) at 09/18/2020 0759 Last data filed at 09/18/2020 9357 Gross per 24 hour  Intake 8512.66 ml  Output 5855 ml  Net 2657.66 ml   Filed Weights   09/16/20 0030 09/17/20 0445 09/18/20 0100  Weight: 122.5 kg 122.4 kg 122.6 kg    Examination: Gen: ill-appearing, sedated HEENT: NCAT head, trached, ECMO in place CV: regular rate and rhythm, S1, S2 normal, No rubs, no murmurs, no gallops Pulm: Distant breath sounds Abd: Distended, soft, non-tender Extm: ROM intact, Peripheral pulses diminished, 2+ pitting edema up to knees Skin: Dry, Warm, normal turgor  Neuro: [facility] Problem list      Assessment &  Plan:   Acute hypoxemic and hypercarbic respiratory failure secondary to COVID PNA with severe ARDS. S/p VV ECMO cannulation 09/23/2020.  Tracheostomy 21/82 complicated by trach site bleeding HAP Completed remdesivir.  Completed baricitinib.  Completed CAP coverage. - Appreacite heart failure recs -c/w full ECMO support; bival for AC -c/w ultralung protective ventilation; goal FiO2 50% or less -decrease solumedrol frequency to BID -routine trach care -VAP prevention  protocol -c/w meropenem and vanc; deescalate based on cultures when able -c/w plex- next tx today -C/w furosemide to 75m BID; strict I/Os  Bleeding around trach -hold bival 4 hours, restart with PTT goal ~50 -injected with lidocaine with 1% epi again this morning  Agitated delirium requiring titration of IV sedation -PAD protocol; goal RASS -1 and tolerance of MV & ECMO -con't seroquel -con't enteral oxycodone and clonazepam  Bilateral lower ext DVTs- provoked by COVID infection -on bivaliriduin; goal PTT 50-55 currently  Steroid-induced hyperglycemia- uncontrolled -C/w levemir to 38 units BID -con't TF coverage and SSI PRN -goal BG 140-180  Hypothyroidism -con't PTA synthroid   HLD -con't statin  Hemolysis causing hyperbilirubinemia- ongoing, circuit changed, has plateaued somewhat. Potentially due to uncontrolled sepsis.  Rheumatologic-associated MAHA remains in differential.   -Con't PLEX- next treatment tomorrow. Appreciate Nephrology's assistance  Hypervolemia Hypernatremia and hyperchloremia Azotemia -lasix 424mBID -strict I/Os -holding free water at 200cc Q2h until hypervolemia resolves -con't montoring  Acute anemia & thrombocytopenia- due to hemolysis, acute illness -con't to monitor -transfuse for Hb<8; 1 unit pRBCs overnight  Plan discussed with Palliative Care, Cardiology, ECMO specialist, RN, pharmacy at beside.    Best practice (evaluated daily)  Diet: TF Pain/Anxiety/Delirium protocol (if indicated): as above VAP protocol (if indicated): yes DVT prophylaxis: Bivalirudin GI prophylaxis: pantoprazole  Glucose control: basal-bolus Mobility: bedrest Last date of multidisciplinary goals of care discussion: wife updated over the phone 12/15 Family and staff present RN, physicain Summary of discussion con't aggressive care Follow up goals of care discussion due: 12/22 Code Status: full Disposition: ICU  LeMosetta AnisMD 09/18/2020, 9:13  AM Pager: [phone]-3, [facility]

## 2020-09-18 NOTE — Progress Notes (Signed)
Pharmacy Antibiotic Note  Donald Rowe is a 59 y.o. male admitted on 09/09/2020 with pneumonia.  Pharmacy has been consulted for vancomycin dosing.  Completed 7d of cefepime. Started on Meropenem on 12/17. WBC up slightly at 12.6. Scr stable at 1.35.  Afebrile. CXR still showing severe diffuse infiltrates.     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 Ruthann Cancer won't treat for now unless spikes fever, or if it grows in another source.  Plan: Continue vancomycin 1 gm IV q12 hrs - will stop after today Continue meropenem 2 gm IV q8 hrs - will stop tomorrow. Will watch renal function closely and will adjust closely. Monitor cx results, clinical picture, renal fx   Height: _0  (175.3 cm) Weight: 122.6 kg (270 lb 4.5 oz) IBW/kg (Calculated) : 70.7  Temp (24hrs), Avg:97.9 F (36.6 C), Min:97.7 F (36.5 C), Max:98.06 F (36.7 C)  Recent Labs  Lab 09/13/20 1157 09/13/20 1700 09/14/20 0352 09/14/20 1645 09/16/20 0357 09/16/20 1145 09/16/20 1615 09/17/20 0454 09/17/20 1722 09/17/20 2241 09/18/20 0312 09/18/20 0436  WBC  --    < > 18.8*   < > 13.2*  --  14.4* 14.8* 12.9* 11.9*  --  12.6*  CREATININE  --    < > 1.46*   < > 1.35* 1.26* 1.32* 1.30* 1.37*  --  1.35*  --   LATICACIDVEN  --    < > 1.7   < > 1.2  --  1.3 1.6 1.4  --  1.6  --   VANCOTROUGH  --   --   --   --   --   --   --   --   --   --  20  --   VANCORANDOM 18  --  18  --   --   --   --   --   --   --   --   --    < > = values in this interval not displayed.    Estimated Creatinine Clearance: 76.3 mL/min (A) (by C-G formula based on SCr of 1.35 mg/dL (H)).    No Known Allergies  Antimicrobials this admission: Meropenem 12/17>> Vancomycin 12/14 >> (12/20) Cefepime 12/7 >> 12/14; 12/17 Ceftriaxone 12/5>> 12/6 Azithromycin 12/5>12/6  Microbiology results: 12/4 BCx: neg 12/5 MRSA PCR: neg 12/12 Sputum: rare MRSA   12/17 BAL: MRSA, candida   [place] Crane,  Vena Austria, BCPS, BCCP Clinical Pharmacist  09/18/2020 3:14 PM   [facility] pharmacy phone numbers are listed on [email or web address]  Please check [email or web address] for unit-specific pharmacy phone numbers.

## 2020-09-18 NOTE — Progress Notes (Signed)
1 PRBC transfused for  hemoglobin  Of 7.5. Able to titrate levophed off now.

## 2020-09-19 ENCOUNTER — Inpatient Hospital Stay (HOSPITAL_COMMUNITY): Payer: HRSA Program

## 2020-09-19 DIAGNOSIS — J9601 Acute respiratory failure with hypoxia: Secondary | ICD-10-CM | POA: Diagnosis not present

## 2020-09-19 DIAGNOSIS — J1282 Pneumonia due to coronavirus disease 2019: Secondary | ICD-10-CM | POA: Diagnosis not present

## 2020-09-19 DIAGNOSIS — U071 COVID-19: Secondary | ICD-10-CM | POA: Diagnosis not present

## 2020-09-19 LAB — POCT I-STAT 7, (LYTES, BLD GAS, ICA,H+H)
Acid-Base Excess: 0 mmol/L (ref 0.0–2.0)
Acid-Base Excess: 3 mmol/L — ABNORMAL HIGH (ref 0.0–2.0)
Acid-Base Excess: 4 mmol/L — ABNORMAL HIGH (ref 0.0–2.0)
Acid-Base Excess: 1 mmol/L (ref 0.0–2.0)
Acid-base deficit: 1 mmol/L (ref 0.0–2.0)
Acid-base deficit: 1 mmol/L (ref 0.0–2.0)
Bicarbonate: 25.6 mmol/L (ref 20.0–28.0)
Bicarbonate: 28.5 mmol/L — ABNORMAL HIGH (ref 20.0–28.0)
Bicarbonate: 29.5 mmol/L — ABNORMAL HIGH (ref 20.0–28.0)
Bicarbonate: 24.5 mmol/L (ref 20.0–28.0)
Bicarbonate: 25.5 mmol/L (ref 20.0–28.0)
Bicarbonate: 26.7 mmol/L (ref 20.0–28.0)
Calcium, Ion: 1.24 mmol/L (ref 1.15–1.40)
Calcium, Ion: 1.27 mmol/L (ref 1.15–1.40)
Calcium, Ion: 1.28 mmol/L (ref 1.15–1.40)
Calcium, Ion: 1.17 mmol/L (ref 1.15–1.40)
Calcium, Ion: 1.26 mmol/L (ref 1.15–1.40)
Calcium, Ion: 1.23 mmol/L (ref 1.15–1.40)
HCT: 25 % — ABNORMAL LOW (ref 39.0–52.0)
HCT: 27 % — ABNORMAL LOW (ref 39.0–52.0)
HCT: 27 % — ABNORMAL LOW (ref 39.0–52.0)
HCT: 28 % — ABNORMAL LOW (ref 39.0–52.0)
HCT: 27 % — ABNORMAL LOW (ref 39.0–52.0)
HCT: 24 % — ABNORMAL LOW (ref 39.0–52.0)
Hemoglobin: 8.5 g/dL — ABNORMAL LOW (ref 13.0–17.0)
Hemoglobin: 9.2 g/dL — ABNORMAL LOW (ref 13.0–17.0)
Hemoglobin: 9.2 g/dL — ABNORMAL LOW (ref 13.0–17.0)
Hemoglobin: 9.5 g/dL — ABNORMAL LOW (ref 13.0–17.0)
Hemoglobin: 9.2 g/dL — ABNORMAL LOW (ref 13.0–17.0)
Hemoglobin: 8.2 g/dL — ABNORMAL LOW (ref 13.0–17.0)
O2 Saturation: 86 %
O2 Saturation: 98 %
O2 Saturation: 97 %
O2 Saturation: 98 %
O2 Saturation: 97 %
O2 Saturation: 97 %
Patient temperature: 36.8
Patient temperature: 36.8
Patient temperature: 36.7
Patient temperature: 37
Patient temperature: 36.7
Patient temperature: 36.8
Potassium: 4.9 mmol/L (ref 3.5–5.1)
Potassium: 4.8 mmol/L (ref 3.5–5.1)
Potassium: 4.6 mmol/L (ref 3.5–5.1)
Potassium: 4.7 mmol/L (ref 3.5–5.1)
Potassium: 4.9 mmol/L (ref 3.5–5.1)
Potassium: 4.6 mmol/L (ref 3.5–5.1)
Sodium: 150 mmol/L — ABNORMAL HIGH (ref 135–145)
Sodium: 150 mmol/L — ABNORMAL HIGH (ref 135–145)
Sodium: 149 mmol/L — ABNORMAL HIGH (ref 135–145)
Sodium: 152 mmol/L — ABNORMAL HIGH (ref 135–145)
Sodium: 151 mmol/L — ABNORMAL HIGH (ref 135–145)
Sodium: 152 mmol/L — ABNORMAL HIGH (ref 135–145)
TCO2: 27 mmol/L (ref 22–32)
TCO2: 30 mmol/L (ref 22–32)
TCO2: 31 mmol/L (ref 22–32)
TCO2: 26 mmol/L (ref 22–32)
TCO2: 27 mmol/L (ref 22–32)
TCO2: 28 mmol/L (ref 22–32)
pCO2 arterial: 42.4 mmHg (ref 32.0–48.0)
pCO2 arterial: 46.1 mmHg (ref 32.0–48.0)
pCO2 arterial: 47.8 mmHg (ref 32.0–48.0)
pCO2 arterial: 45.4 mmHg (ref 32.0–48.0)
pCO2 arterial: 48.1 mmHg — ABNORMAL HIGH (ref 32.0–48.0)
pCO2 arterial: 46.3 mmHg (ref 32.0–48.0)
pH, Arterial: 7.387 (ref 7.350–7.450)
pH, Arterial: 7.398 (ref 7.350–7.450)
pH, Arterial: 7.397 (ref 7.350–7.450)
pH, Arterial: 7.34 — ABNORMAL LOW (ref 7.350–7.450)
pH, Arterial: 7.332 — ABNORMAL LOW (ref 7.350–7.450)
pH, Arterial: 7.368 (ref 7.350–7.450)
pO2, Arterial: 52 mmHg — ABNORMAL LOW (ref 83.0–108.0)
pO2, Arterial: 97 mmHg (ref 83.0–108.0)
pO2, Arterial: 94 mmHg (ref 83.0–108.0)
pO2, Arterial: 115 mmHg — ABNORMAL HIGH (ref 83.0–108.0)
pO2, Arterial: 93 mmHg (ref 83.0–108.0)
pO2, Arterial: 93 mmHg (ref 83.0–108.0)

## 2020-09-19 LAB — CBC
HCT: 28.5 % — ABNORMAL LOW (ref 39.0–52.0)
HCT: 28.5 % — ABNORMAL LOW (ref 39.0–52.0)
Hemoglobin: 9.5 g/dL — ABNORMAL LOW (ref 13.0–17.0)
Hemoglobin: 9 g/dL — ABNORMAL LOW (ref 13.0–17.0)
MCH: 31.9 pg (ref 26.0–34.0)
MCH: 30.9 pg (ref 26.0–34.0)
MCHC: 33.3 g/dL (ref 30.0–36.0)
MCHC: 31.6 g/dL (ref 30.0–36.0)
MCV: 95.6 fL (ref 80.0–100.0)
MCV: 97.9 fL (ref 80.0–100.0)
Platelets: 155 10*3/uL (ref 150–400)
Platelets: 139 10*3/uL — ABNORMAL LOW (ref 150–400)
RBC: 2.98 MIL/uL — ABNORMAL LOW (ref 4.22–5.81)
RBC: 2.91 MIL/uL — ABNORMAL LOW (ref 4.22–5.81)
RDW: 19.1 % — ABNORMAL HIGH (ref 11.5–15.5)
RDW: 19 % — ABNORMAL HIGH (ref 11.5–15.5)
WBC: 16.6 10*3/uL — ABNORMAL HIGH (ref 4.0–10.5)
WBC: 15.8 10*3/uL — ABNORMAL HIGH (ref 4.0–10.5)
nRBC: 1.5 % — ABNORMAL HIGH (ref 0.0–0.2)
nRBC: 1.1 % — ABNORMAL HIGH (ref 0.0–0.2)

## 2020-09-19 LAB — PROTIME-INR
INR: 2 — ABNORMAL HIGH (ref 0.8–1.2)
Prothrombin Time: 21.8 seconds — ABNORMAL HIGH (ref 11.4–15.2)

## 2020-09-19 LAB — APTT
aPTT: 49 seconds — ABNORMAL HIGH (ref 24–36)
aPTT: 87 seconds — ABNORMAL HIGH (ref 24–36)
aPTT: 88 seconds — ABNORMAL HIGH (ref 24–36)

## 2020-09-19 LAB — LACTIC ACID, PLASMA
Lactic Acid, Venous: 1.5 mmol/L (ref 0.5–1.9)
Lactic Acid, Venous: 1.3 mmol/L (ref 0.5–1.9)

## 2020-09-19 LAB — BASIC METABOLIC PANEL
Anion gap: 7 (ref 5–15)
Anion gap: 8 (ref 5–15)
BUN: 93 mg/dL — ABNORMAL HIGH (ref 6–20)
BUN: 87 mg/dL — ABNORMAL HIGH (ref 6–20)
CO2: 25 mmol/L (ref 22–32)
CO2: 23 mmol/L (ref 22–32)
Calcium: 8.4 mg/dL — ABNORMAL LOW (ref 8.9–10.3)
Calcium: 7.9 mg/dL — ABNORMAL LOW (ref 8.9–10.3)
Chloride: 118 mmol/L — ABNORMAL HIGH (ref 98–111)
Chloride: 118 mmol/L — ABNORMAL HIGH (ref 98–111)
Creatinine, Ser: 1.34 mg/dL — ABNORMAL HIGH (ref 0.61–1.24)
Creatinine, Ser: 1.28 mg/dL — ABNORMAL HIGH (ref 0.61–1.24)
GFR, Estimated: >60 mL/min (ref >60)
GFR, Estimated: >60 mL/min (ref >60)
Glucose, Bld: 160 mg/dL — ABNORMAL HIGH (ref 70–99)
Glucose, Bld: 166 mg/dL — ABNORMAL HIGH (ref 70–99)
Potassium: 4.9 mmol/L (ref 3.5–5.1)
Potassium: 5 mmol/L (ref 3.5–5.1)
Sodium: 150 mmol/L — ABNORMAL HIGH (ref 135–145)
Sodium: 149 mmol/L — ABNORMAL HIGH (ref 135–145)

## 2020-09-19 LAB — FIBRINOGEN: Fibrinogen: 104 mg/dL — ABNORMAL LOW (ref 210–475)

## 2020-09-19 LAB — LACTATE DEHYDROGENASE: LDH: 544 U/L — ABNORMAL HIGH (ref 98–192)

## 2020-09-19 LAB — HEPATIC FUNCTION PANEL
ALT: 159 U/L — ABNORMAL HIGH (ref 0–44)
AST: 133 U/L — ABNORMAL HIGH (ref 15–41)
Albumin: 2.8 g/dL — ABNORMAL LOW (ref 3.5–5.0)
Alkaline Phosphatase: 110 U/L (ref 38–126)
Bilirubin, Direct: 3.3 mg/dL — ABNORMAL HIGH (ref 0.0–0.2)
Indirect Bilirubin: 1.7 mg/dL — ABNORMAL HIGH (ref 0.3–0.9)
Total Bilirubin: 5 mg/dL — ABNORMAL HIGH (ref 0.3–1.2)
Total Protein: 4.2 g/dL — ABNORMAL LOW (ref 6.5–8.1)

## 2020-09-19 LAB — GLUCOSE, CAPILLARY
Glucose-Capillary: 105 mg/dL — ABNORMAL HIGH (ref 70–99)
Glucose-Capillary: 114 mg/dL — ABNORMAL HIGH (ref 70–99)
Glucose-Capillary: 125 mg/dL — ABNORMAL HIGH (ref 70–99)
Glucose-Capillary: 143 mg/dL — ABNORMAL HIGH (ref 70–99)
Glucose-Capillary: 147 mg/dL — ABNORMAL HIGH (ref 70–99)
Glucose-Capillary: 155 mg/dL — ABNORMAL HIGH (ref 70–99)

## 2020-09-19 LAB — TRIGLYCERIDES: Triglycerides: 151 mg/dL — ABNORMAL HIGH (ref <150)

## 2020-09-19 MED ORDER — PANTOPRAZOLE SODIUM 40 MG PO PACK
40.0000 mg | PACK | Freq: Two times a day (BID) | ORAL | Status: DC
  Administered 2020-09-19 – 2020-10-12 (×46): 40 mg
  Filled 2020-09-19 (×46): qty 20

## 2020-09-19 MED ORDER — SODIUM CHLORIDE 0.9 % IV SOLN
INTRAVENOUS | Status: DC
  Filled 2020-09-19 (×4): qty 200

## 2020-09-19 MED ORDER — FREE WATER
300.0000 mL | Status: DC
  Administered 2020-09-19 – 2020-09-22 (×18): 300 mL

## 2020-09-19 MED ORDER — DIPHENHYDRAMINE HCL 25 MG PO CAPS: 25.0000 mg | ORAL_CAPSULE | Freq: Four times a day (QID) | ORAL | Status: DC | PRN

## 2020-09-19 MED ORDER — ACD FORMULA A 0.73-2.45-2.2 GM/100ML VI SOLN: 1000.0000 mL | Status: DC

## 2020-09-19 MED ORDER — ACETAMINOPHEN 325 MG PO TABS: 650.0000 mg | ORAL_TABLET | ORAL | Status: DC | PRN

## 2020-09-19 MED ORDER — HEPARIN SODIUM (PORCINE) 1000 UNIT/ML IJ SOLN: 1000.0000 [IU] | Freq: Once | INTRAMUSCULAR | Status: DC

## 2020-09-19 MED ORDER — CALCIUM CARBONATE ANTACID 500 MG PO CHEW: 2.0000 | CHEWABLE_TABLET | ORAL | Status: DC

## 2020-09-19 MED ORDER — CALCIUM GLUCONATE-NACL 2-0.675 GM/100ML-% IV SOLN: 2.0000 g | Freq: Once | INTRAVENOUS | Status: DC

## 2020-09-19 MED ORDER — HEPARIN SODIUM (PORCINE) 1000 UNIT/ML IJ SOLN
INTRAMUSCULAR | Status: AC
  Filled 2020-09-19: qty 4

## 2020-09-19 MED ORDER — CALCIUM GLUCONATE-NACL 2-0.675 GM/100ML-% IV SOLN
INTRAVENOUS | Status: AC
  Administered 2020-09-19: 10:00:00 2000 mg
  Filled 2020-09-19: qty 100

## 2020-09-19 MED ORDER — DIPHENHYDRAMINE HCL 25 MG PO CAPS
25.0000 mg | ORAL_CAPSULE | Freq: Four times a day (QID) | ORAL | Status: DC | PRN
Start: 2020-09-19 — End: 2020-09-19

## 2020-09-19 MED ORDER — ACD FORMULA A 0.73-2.45-2.2 GM/100ML VI SOLN
Status: AC
  Filled 2020-09-19: qty 500

## 2020-09-19 MED ORDER — SODIUM CHLORIDE 0.9 % IV SOLN
INTRAVENOUS | Status: AC
  Filled 2020-09-19 (×4): qty 200

## 2020-09-19 NOTE — Progress Notes (Signed)
ANTICOAGULATION CONSULT NOTE  Pharmacy Consult for bivalirudin Indication: ECMO + bilateral DVTs   Recent Labs    09/17/20 0454 09/17/20 0503 09/18/20 0312 09/18/20 0434 09/18/20 0436 09/18/20 1619 09/18/20 1631 09/19/20 0412 09/19/20 0418 09/19/20 0924 09/19/20 1014 09/19/20 1204  HGB 8.8*   < >  --    < > 8.8* 9.0*   < > 9.5*   < > 9.2* 9.2* 9.5*  HCT 28.1*   < >  --    < > 27.5* 28.0*   < > 28.5*   < > 27.0* 27.0* 28.0*  PLT 123*   < >  --   --  122* 135*  --  155  --   --   --   --   APTT 56*   < > 51*  --   --  51*  --  49*  --   --   --   --   LABPROT 29.8*  --  26.1*  --   --   --   --  21.8*  --   --   --   --   INR 3.0*  --  2.5*  --   --   --   --  2.0*  --   --   --   --   CREATININE 1.30*   < > 1.35*  --   --  1.36*  --  1.34*  --   --   --   --    < > = values in this interval not displayed.    Estimated Creatinine Clearance: 75.6 mL/min (A) (by C-G formula based on SCr of 1.34 mg/dL (H)).   Assessment: 83 yom unvaccinated presenting with severe COVID ARDs, intubated on 12/6, continued to have ventilation issues despite NMB and proning - started on VV ECMO on 12/8.  D-dimer >20 > now improved 14, found to have bilateral DVTs.  Given significant bleeding issues from trach, targeting lower aPTT goal of 50-55 seconds after discussion with ECMO team.   aPTT this AM is 49.  No overt bleeding or complications noted.  Discussed goal range with Dr. Gala Romney.  Since no longer bleeding, will increase goal range to 50-60.  Goal of Therapy:  aPTT 50-60 seconds Monitor platelets by anticoagulation protocol: Yes   Plan:  Increase bivalirudin to 0.13 mg/kg/hr. Check q12h aPTT Follow bleeding, CBC  Reece Leader, Colon Flattery, [facility] Clinical Pharmacist  09/19/2020 2:13 PM   [facility] pharmacy phone numbers are listed on [email or web address]

## 2020-09-19 NOTE — Plan of Care (Signed)
  Problem: Education: Goal: Knowledge of risk factors and measures for prevention of condition will improve Outcome: Progressing   Problem: Coping: Goal: Psychosocial and spiritual needs will be supported Outcome: Progressing   Problem: Fluid Volume: Goal: Hemodynamic stability will improve Outcome: Progressing   Problem: Respiratory: Goal: Ability to maintain adequate ventilation will improve Outcome: Progressing   Problem: Education: Goal: Knowledge of General Education information will improve Description: Including pain rating scale, medication(s)/side effects and non-pharmacologic comfort measures Outcome: Progressing

## 2020-09-19 NOTE — Progress Notes (Addendum)
Occupational Therapy Treatment Patient Details Name: Donald Rowe MRN: [id number] DOB: [birth year] Today's Date: 09/19/2020    History of present illness 59 y.o. male with a pertinent history of hypothyroidism on levothyroxine and hyperlipidemia who was diagnosed with Covid 11/27 and is not vaccinated.  With EMS SaO2 on RA 78%O2. ED patient was increased to 6 L oxygen satting about 92%,. CTA of the Chest was assessed and negative for pulmonary embolism but showed diffuse bilateral infiltrates. He was treated with IV remdesivir, steroids and baricitinib.  Initially, he was also treated with antibiotics but these were stopped on 12/6 with a low procalcitonin. 12/07 intubated, central line, A line, 12/08 ECMO cannulation, cortrak, attempted extubation 12/9 failed reintubated. Trach placed 12/16.   OT comments  Upon arrival, pt supine, sedated, and on vent via trach. Session completed with RN and ECMO specialist. Use of kreg tilt bed for semi standing positioning at 35*; tolerating for 25 minutes. Pt continues to present with increased edema at BUEs/BLEs. Facilitating PROM of BUEs and cervical ROM. Pt with episodes of coughing without change in vitals.  Vitals. Rest: HR 90, SpO2 100% on trach PEEP 10 and FiO2 50%, RR 16.  Semi standing at 35* HR 90, SpO2 100% on trach, RR 20. ART 116/38 Continue to recommend dc with post-acute rehab and will continue to follow acutely as admitted.    Follow Up Recommendations  CIR    Equipment Recommendations  Other (comment) (Defer to nexr venue and pending progress)    Recommendations for Other Services PT consult;Rehab consult;Speech consult    Precautions / Restrictions Precautions Precautions: Other (comment) Precaution Comments: COVID+ (off precautions), ECMO, cortrak, intubated,  condom cath, flexiseal       Mobility Bed Mobility               General bed mobility comments: Continued: Total A +2 for reposition in bed. Use of kreg tilt bed  for semi standing. +2 to manage line and safety.  Transfers                 General transfer comment: Continued to utilized tilt bed to come to semi-standing position    Balance Overall balance assessment: Needs assistance         Standing balance support: No upper extremity supported;During functional activity Standing balance-Leahy Scale: Poor Standing balance comment: Tilit in kreg bed at 35 degrees for 20 minutes. Pt tolerated well overall.                           ADL either performed or assessed with clinical judgement   ADL Overall ADL's : Needs assistance/impaired                                       General ADL Comments: Total A for ADLs.  Patient remains sedated.     Vision       Perception     Praxis      Cognition Arousal/Alertness: Suspect due to medications Behavior During Therapy: Flat affect Overall Cognitive Status: Difficult to assess                                          Exercises Exercises: General Upper Extremity;General Lower Extremity;Other exercises General Exercises - Upper Extremity  Shoulder Flexion: PROM;Both;10 reps;Supine (tilt bed at 35*) Shoulder ABduction: PROM;Both;Supine;10 reps (tilt bed at 35*) Shoulder ADduction: PROM;Both;Supine;10 reps (tilt bed at 35*) Elbow Flexion: PROM;Both;Supine;10 reps (tilt bed at 35*) Elbow Extension: PROM;Both;Supine;10 reps (tilt bed at 35*) Wrist Flexion: PROM;Both;Supine;10 reps (tilt bed at 35*) Wrist Extension: PROM;Both;Supine;10 reps (tilt bed at 35*) Digit Composite Flexion: PROM;Both;Supine;10 reps (tilt bed at 35*) Composite Extension: PROM;Both;Supine;10 reps (tilt bed at 35*) General Exercises - Lower Extremity Ankle Circles/Pumps: PROM;Both;5 reps;Supine Other Exercises Other Exercises: Cervical flexion and extension; x5 Other Exercises: Cervical lateral flexion/extension; x5 Other Exercises: Cervical rotation; x5   Shoulder  Instructions       General Comments RN and ECMO specialist present throughout session. Rest: HR 90, Spo2 100% on trach PEEP 10 and FiO2 50%, RR 16. Semi standing at 35* HR 90, SpO2 100% on trach, RR 20. ART 116/38    Pertinent Vitals/ Pain       Pain Assessment: Faces Faces Pain Scale: No hurt Pain Location: no change in response to nail bed pressure or in elevated position Pain Intervention(s): Monitored during session  Home Living                                          Prior Functioning/Environment              Frequency  Min 2X/week        Progress Toward Goals  OT Goals(current goals can now be found in the care plan section)  Progress towards OT goals: Progressing toward goals  Acute Rehab OT Goals Patient Stated Goal: Patient tolerating increasing angle in stand table.  Up to 45 degrees. OT Goal Formulation: Patient unable to participate in goal setting Time For Goal Achievement: 09/26/20 Potential to Achieve Goals: Fair ADL Goals Pt Will Perform Grooming: with mod assist;sitting;bed level Pt Will Perform Upper Body Bathing: with mod assist;sitting;bed level Pt/caregiver will Perform Home Exercise Program: Increased ROM;Increased strength;Both right and left upper extremity;With minimal assist;With written HEP provided Additional ADL Goal #1: Pt will tolerate semi-standing posture for 20 minutes at >40 degress in preparation for ADLs Additional ADL Goal #2: Pt will perform bed mobility with Mod A +2 in preparation for ADLs  Plan Discharge plan remains appropriate    Co-evaluation                 AM-PAC OT "6 Clicks" Daily Activity     Outcome Measure   Help from another person eating meals?: Total Help from another person taking care of personal grooming?: Total Help from another person toileting, which includes using toliet, bedpan, or urinal?: Total Help from another person bathing (including washing, rinsing, drying)?:  Total Help from another person to put on and taking off regular upper body clothing?: Total Help from another person to put on and taking off regular lower body clothing?: Total 6 Click Score: 6    End of Session    OT Visit Diagnosis: Unsteadiness on feet (R26.81);Other abnormalities of gait and mobility (R26.89);Muscle weakness (generalized) (M62.81);Pain Pain - part of body:  (Generalized)   Activity Tolerance Patient limited by fatigue;Patient limited by lethargy   Patient Left in bed;with call bell/phone within reach;with nursing/sitter in room   Nurse Communication Other (comment) (RN present throughout session)        Time: 7829-5621 OT Time Calculation (min): 39 min  Charges: OT General Charges $OT  Visit: 1 Visit OT Treatments $Therapeutic Activity: 23-37 mins $Therapeutic Exercise: 8-22 mins  Jove Beyl MSOT, OTR/L Acute Rehab Pager: [phone] Office: [phone]   Theodoro Grist Atiyana Welte 09/19/2020, 4:25 PM

## 2020-09-19 NOTE — Progress Notes (Signed)
NAME:  Donald Rowe, MRN:  [id number], DOB:  Oct 31, 1960, LOS: 82 ADMISSION DATE:  09/21/2020, CONSULTATION DATE:  12/6 REFERRING MD:  Dr. Aileen Fass, CHIEF COMPLAINT:  SOB    Brief History   59 y/o M admitted 12/4 with 1 week hx of SOB, known COVID positive.  He is unvaccinated.  CTA chest negative for PE but demonstrated diffuse bilateral infiltrates.   History of present illness   59 y/o unvaccinated male, admitted 12/4 with a one week history of shortness of breath.    The patient began feeling poorly the day before Thanksgiving with fever, weakness and body aches. He tested positive for COVID on 12/27 prior to presentation.  He developed progressive shortness of breath.  In the ER, the patient was found to have saturations of 78%.  He was started on 6L O2 with initial improvement in saturations.  The patient was admitted per [facility]. CTA of the Chest was assessed and negative for pulmonary embolism but showed diffuse bilateral infiltrates. He was treated with IV remdesivir, steroids and baricitinib.  Initially, he was also treated with antibiotics but these were stopped on 12/6 with a low procalcitonin.  LE doppler negative for DVT. The patient elected to be DNR / no intubation.  He developed worsening respiratory failure with hypoxia requiring 60L flow 100% and PCCM consulted 12/6 for evaluation.   Past Medical History  Hypothyroidism  HLD   Significant Hospital Events/Procedures  12/04 Admit  12/06 PCCM consulted  12/07 Intubated, central line, a line 12/08 VV ECMO Cannulaton, cortrak 12/13 circuit change for hemolytic anemia 12/16 Tracheostomy  12/17 bronch w/ BAL  Consults:  PCCM, Heart failure, TCTS, ECMO  Significant Diagnostic Tests:   CTA Chest 12/4 >> extensive bilateral airspace disease, no large PE identified, limited study   LE Venous Duplex 12/4 >> negative for DVT bilaterally   LE venous duplex 12/8> BLE DVTs  Micro Data:  COVID 12/4 >> negative  Influenza A/B  12/4 >> negative  MRSA PCR 12/5 >> negative  BCx2 12/4 >> NG Trach aspirate 12/12> rare MRSA BAL 12/17> staph aureus Aspergillus Ag BAL 12/17>  Antimicrobials:  Azithromycin 12/5 >> 12/6  Ceftriaxone 12/5 >> 12/6  Cefepime 12/7>12/14 vanc 12/14>12/20 Meropenem 12/17>12/21  Interim history/subjective:  O/N event: Diuresed yesterday. 8L out yesterday.  Mr.[name] was examined and evaluated at bedside this am. He was noted to be minimally responsive. Well-sedated. Had good urinary output per bedside nursing staff  Objective   Blood pressure 112/60, pulse (!) 106, temperature 98.06 F (36.7 C), resp. rate 14, height _0  (1.753 m), weight 118.9 kg, SpO2 96 %.    Vent Mode: PCV FiO2 (%):  [50 %-60 %] 60 % Set Rate:  [12 bmp] 12 bmp PEEP:  [10 cmH20] 10 cmH20 Plateau Pressure:  [20 cmH20-32 cmH20] 21 cmH20   Intake/Output Summary (Last 24 hours) at 09/19/2020 0708 Last data filed at 09/19/2020 0600 Gross per 24 hour  Intake 4786.55 ml  Output 9240 ml  Net -4453.45 ml   Filed Weights   09/17/20 0445 09/18/20 0100 09/19/20 0600  Weight: 122.4 kg 122.6 kg 118.9 kg    Examination: Gen: Well-developed, ill-appearing HEENT: NCAT head Neck: supple, ROM intact, ECMO lines intact and functioning CV: Tachycardic, regular rhythm Pulm: Distant breath sounds Abd: Soft, BS+, NTND, No rebound, no guarding Extm: Peripheral pulses intact, 2+ pitting edema up to mid shin Skin: Dry, Warm, small areas of ecchymois Neuro: RASS-1   Resolved Hospital Problem list  Assessment & Plan:   Acute hypoxemic and hypercarbic respiratory failure secondary to COVID PNA with severe ARDS. S/p VV ECMO cannulation 09/29/2020.  Tracheostomy 78/67 complicated by trach site bleeding HAP Completed remdesivir.  Completed baricitinib.  Completed CAP coverage. Vanc d/c yesterday. Meropenem scheduled to end today. Afebrile overnight.  Wbc up-trending 13.6->16.6 - Appreciate heart failure recs -c/w full  ECMO support; bival for AC -c/w ultralung protective ventilation; goal FiO2 50% or less -C/w solumedrol 60 BID -routine trach care -VAP prevention protocol - Meropenem day 5/5 -c/w plex- next tx today -C/w furosemide to 69m BID; strict I/Os  Agitated delirium requiring titration of IV sedation Well sedated this am. -PAD protocol; goal RASS -1 and tolerance of MV & ECMO -con't seroquel -con't enteral oxycodone and clonazepam  Bilateral lower ext DVTs- provoked by COVID infection -on bivaliriduin; goal PTT 50-55 currently  Steroid-induced hyperglycemia Am cbg 160 -C/w levemir to 38 units BID -con't TF coverage and SSI PRN -goal BG 140-180  Hypothyroidism -con't PTA synthroid   HLD -con't statin  Hemolytic Anemia Throbocytopenia Hyperbilirubinemia Rheumatologic-associated MAHA remains in differential.  Hgb 9.5, platelet count 155 -Con't PLEX- next treatment today. Appreciate Nephrology's assistance -Transfuse if hgb <8  Hypervolemia Hypernatremia and hyperchloremia Azotemia Sodium unchanged at 150. Much more euvolemic appearing today. Creatinine stable at 1.3 -lasix 462mBID -strict I/Os -Free water changed to   Plan discussed with Palliative Care, Cardiology, ECMO specialist, RN, pharmacy at beside.    Best practice (evaluated daily)  Diet: TF Pain/Anxiety/Delirium protocol (if indicated): as above VAP protocol (if indicated): yes DVT prophylaxis: Bivalirudin GI prophylaxis: pantoprazole  Glucose control: basal-bolus Mobility: bedrest Last date of multidisciplinary goals of care discussion: wife updated over the phone 12/15 Family and staff present RN, physicain Summary of discussion con't aggressive care Follow up goals of care discussion due: 12/22 Code Status: full Disposition: ICU  LeMosetta AnisMD 09/19/2020, 7:08 AM Pager: [phone]-[email or web address], [facility]

## 2020-09-19 NOTE — Progress Notes (Signed)
Patient ID: Donald Rowe, male   DOB: 08-15-1961, 59 y.o.   MRN: [id number]    Advanced Heart Failure Rounding Note   Subjective:    12/8 Cannulated for VV ECMO - 32 FR  RIJ Crescent 12/9 Extubated/Reintubated for altered mental status 12/13 Circuit change for elevated LDH 12/15 PLEX started 12/16 Tracheostomy  Relatively stable overnight. Bleeding from trach has stopped. Remains sedated. Circuit stable.   Diuresed well. -4L   Did not get PLEX yesterday. Today day is 4/5. LDH 434 -> 544. Sugars better controlled.   CXR diffuse infiltrates slightly improved. Cannula position ok Personally reviewed   ECMO   Speed 3550 Flow 4.7L Sweep 13 dP 22 pVen  -93  ABG: 7.39/42/52/86% Hgb 9.5 LDH 434 -> 544 Lactic acid 1.5 PTT 49  Vent 50% TVs 100-200s   Objective:   Weight Range:  Vital Signs:   Temp:  [97.7 F (36.5 C)-98.4 F (36.9 C)] 98.24 F (36.8 C) (12/21 0700) Pulse Rate:  [86-106] 106 (12/21 0700) Resp:  [0-22] 10 (12/21 0700) BP: (112-181)/(59-66) 112/60 (12/20 1600) SpO2:  [91 %-100 %] 99 % (12/21 0700) Arterial Line BP: (112-187)/(47-84) 173/61 (12/21 0700) FiO2 (%):  [50 %-60 %] 60 % (12/21 0400) Weight:  [118.9 kg] 118.9 kg (12/21 0600) Last BM Date: 09/18/20  Weight change: Filed Weights   09/17/20 0445 09/18/20 0100 09/19/20 0600  Weight: 122.4 kg 122.6 kg 118.9 kg    Intake/Output:   Intake/Output Summary (Last 24 hours) at 09/19/2020 0735 Last data filed at 09/19/2020 0700 Gross per 24 hour  Intake 4970.24 ml  Output 9640 ml  Net -4669.76 ml     Physical Exam: General:  Sedated on vent HEENT: normal Neck: supple. RIJ ECMO. Carotids 2+ bilat; no bruits. No lymphadenopathy or thryomegaly appreciated. Cor: PMI nondisplaced. Regular rate & rhythm. No rubs, gallops or murmurs. Lungs: clear Abdomen: soft, nontender, nondistended. No hepatosplenomegaly. No bruits or masses. Good bowel sounds. Extremities: no cyanosis, clubbing, rash,  edema Neuro: alert & orientedx3, cranial nerves grossly intact. moves all 4 extremities w/o difficulty. Affect pleasant  Telemetry: sinus 100-110Personally reviewed    Labs: Basic Metabolic Panel: Recent Labs  Lab 09/17/20 0454 09/17/20 0503 09/17/20 1722 09/17/20 1724 09/18/20 0312 09/18/20 0434 09/18/20 1619 09/18/20 1631 09/18/20 2001 09/19/20 0412 09/19/20 0418  NA 148*   < > 150*   < > 148*   < > 150* 151* 151* 150* 150*  K 4.9   < > 5.1   < > 5.2*   < > 4.5 4.5 4.2 4.9 4.9  CL 121*  --  121*  --  119*  --  119*  --   --  118*  --   CO2 21*  --  22  --  23  --  22  --   --  25  --   GLUCOSE 180*  --  204*  --  190*  --  128*  --   --  160*  --   BUN 87*  --  92*  --  95*  --  92*  --   --  93*  --   CREATININE 1.30*  --  1.37*  --  1.35*  --  1.36*  --   --  1.34*  --   CALCIUM 7.7*  --  7.7*  --  7.8*  --  8.1*  --   --  8.4*  --    < > = values in this interval not displayed.  Liver Function Tests: Recent Labs  Lab 09/15/20 0312 09/16/20 0357 09/17/20 0454 09/18/20 0312 09/19/20 0412  AST 44* 31 40 39 133*  ALT 37 27 39 50* 159*  ALKPHOS 137* 73 70 87 110  BILITOT 2.5* 2.4* 2.8* 2.7* 5.0*  PROT 4.5* 4.0* 4.2* 3.9* 4.2*  ALBUMIN 2.7* 3.0* 3.3* 2.8* 2.8*   No results for input(s): LIPASE, AMYLASE in the last 168 hours. Recent Labs  Lab 09/14/20 0821  AMMONIA 48*    CBC: Recent Labs  Lab 09/17/20 1722 09/17/20 1724 09/17/20 2241 09/18/20 0434 09/18/20 0436 09/18/20 1619 09/18/20 1631 09/18/20 2001 09/19/20 0412 09/19/20 0418  WBC 12.9*  --  11.9*  --  12.6* 13.5*  --   --  16.6*  --   HGB 8.2*   < > 7.5*   < > 8.8* 9.0* 8.8* 8.5* 9.5* 8.5*  HCT 25.5*   < > 23.6*   < > 27.5* 28.0* 26.0* 25.0* 28.5* 25.0*  MCV 96.2  --  96.7  --  96.2 94.9  --   --  95.6  --   PLT 122*  --  112*  --  122* 135*  --   --  155  --    < > = values in this interval not displayed.    Cardiac Enzymes: No results for input(s): CKTOTAL, CKMB, CKMBINDEX,  TROPONINI in the last 168 hours.  BNP: BNP (last 3 results) No results for input(s): BNP in the last 8760 hours.  ProBNP (last 3 results) No results for input(s): PROBNP in the last 8760 hours.    Other results:  Imaging: DG CHEST PORT 1 VIEW  Result Date: 09/19/2020 CLINICAL DATA:  ECMO.  COVID. EXAM: PORTABLE CHEST 1 VIEW COMPARISON:  09/18/2020. FINDINGS: Interim removal of left IJ line. Tracheostomy tube, feeding tube, right PICC line, and ECMO device in stable position. Heart size stable. Diffuse severe bilateral pulmonary infiltrates are again noted without interim change. Bilateral layering pleural effusions again cannot be excluded. No pneumothorax. IMPRESSION: 1. Interim removal of left IJ line. Remaining lines and tubes including ECMO device in stable position. 2. Diffuse severe bilateral pulmonary infiltrates are again noted without interim change. Bilateral layering pleural effusions again cannot be excluded. Electronically Signed   By: Marcello Moores  Register   On: 09/19/2020 06:44   DG CHEST PORT 1 VIEW  Result Date: 09/18/2020 CLINICAL DATA:  Tracheostomy.  ECMO.  Recent COVID. EXAM: PORTABLE CHEST 1 VIEW COMPARISON:  09/17/2020. FINDINGS: Tracheostomy tube, left IJ line, feeding tube, right PICC line, ECMO device in stable position. Heart size normal. Diffuse dense bilateral pulmonary infiltrates are again noted. Very slight improvement in aeration may be present. Bilateral layering pleural effusions again cannot be excluded. No pneumothorax. IMPRESSION: 1. Lines and tubes in stable position. 2. Diffuse dense bilateral pulmonary infiltrates are again noted. Very slight improvement in aeration may be present. Bilateral layering pleural effusions again cannot be excluded. Electronically Signed   By: Marcello Moores  Register   On: 09/18/2020 06:39     Medications:     Scheduled Medications:  amLODipine  10 mg Per Tube Daily   aspirin  81 mg Per Tube Daily   calcium gluconate  2 g  Intravenous Once   chlorhexidine gluconate (MEDLINE KIT)  15 mL Mouth Rinse BID   Chlorhexidine Gluconate Cloth  6 each Topical Daily   clonazePAM  2 mg Per Tube BID   cloNIDine  0.2 mg Per Tube TID   docusate  100 mg  Per Tube BID   feeding supplement (PROSource TF)  45 mL Per Tube TID   fiber  1 packet Per Tube BID   free water  200 mL Per Tube Q4H   furosemide  40 mg Intravenous BID   hydrALAZINE  25 mg Per Tube Q8H   hydrochlorothiazide  25 mg Oral Daily   insulin aspart  10 Units Subcutaneous Q4H   insulin aspart  2-6 Units Subcutaneous Q4H   insulin detemir  38 Units Subcutaneous Q12H   lactulose  20 g Per Tube BID   levothyroxine  50 mcg Per Tube Q0600   mouth rinse  15 mL Mouth Rinse 10 times per day   methylPREDNISolone (SOLU-MEDROL) injection  60 mg Intravenous Q12H   metoprolol tartrate  50 mg Per Tube TID PC   oxyCODONE  15 mg Per Tube Q6H   pantoprazole sodium  40 mg Per Tube Daily   pravastatin  40 mg Per Tube q1800   QUEtiapine  100 mg Per Tube BID   sodium chloride flush  10-40 mL Intracatheter Q12H    Infusions:  sodium chloride Stopped (09/16/20 1146)   sodium chloride 10 mL/hr at 09/19/20 0700   albumin human     albumin human 12.5 g (09/17/20 2222)   bivalirudin (ANGIOMAX) infusion 0.5 mg/mL (Non-ACS indications) 0.11 mg/kg/hr (09/19/20 0700)   citrate dextrose     citrate dextrose     dexmedetomidine (PRECEDEX) IV infusion Stopped (09/16/20 1942)   dextrose     feeding supplement (PIVOT 1.5 CAL) 1,000 mL (09/19/20 0453)   fentaNYL infusion INTRAVENOUS 400 mcg/hr (09/19/20 0700)   meropenem (MERREM) IV 2 g (09/19/20 0153)   midazolam 10 mg/hr (09/19/20 0700)   norepinephrine (LEVOPHED) Adult infusion 5 mcg/min (09/17/20 2238)   phenylephrine (NEO-SYNEPHRINE) Adult infusion Stopped (09/16/20 1426)   propofol (DIPRIVAN) infusion 10 mcg/kg/min (09/19/20 0700)    PRN Medications: sodium chloride, acetaminophen  (TYLENOL) oral liquid 160 mg/5 mL, acetaminophen, acetaminophen, albumin human, albuterol, dextrose, diphenhydrAMINE, diphenhydrAMINE, fentaNYL, Gerhardt's butt cream, heparin, labetalol, lip balm, LORazepam, ondansetron **OR** ondansetron (ZOFRAN) IV, polyethylene glycol, sodium chloride flush   Assessment/Plan:   1. Acute hypoxic respiratory failure/ARDS in setting of COVID-19 PNA - Cannulated for VV ECMO on 12/8 after failing intubation x 2 days - has complete remedisivir, baricitinib, Solumedrol.  - s/p trach 12/16 - continues to diurese briskly  - CXR improved slightly again today. Sweep remains at 13. Oxygenation marginal but slightly improved. Will try to wean sweep gently today - evidence of worsening acidosis/infection 12/17 mero added. Improved (today is mero last day) - staph in tracheal aspirate -  vanc added 12/14 mero added 12/17. vanc complete 12/20 - circuit changed 12/13 for elevated LDH and lower flows. PLEX started 12/15. S/p PLEX #3 Saturday. Will get 4th run today. LDH back up slightly off PLEX - PTT goal turned down to 50-60 due to bleeding. Improved. Circuit looks ok. Discussed dosing with PharmD personally. - Driving pressure ok - volume status improving. Continue IV diuresis. BUN/Cr stable.   2. Bilateral DVT - remains on bival - Dosing d/w PharmD  3. DM2 - Insulin adjusted. CBGs improved  4. Obesity -Body mass index is 33.73 kg/m.  5. F/E/N - Na 147 -> 153 -> 146 -> 149 -> 148 -> 148 -> 148 -> 150 - Continue free water dose adjusted  6. HTN - Remains very labile  - Meds adjusted. May have to tolerate some permissive hypertension (especially during periods of agitation) to  avoid hypotension. Hold anti-HTN meds prior to PLEX  7. Dusky R thumb - improved  8. AKI - diuresing briskly. BUN/CR stable - may need dialysis for volume removal using PLEX line  9. Acute encephalopathy - remains agitated  - head CT ok 09/07/20  Plan discussed on  multi-discplinary ECMO rounds with CCM, ECMO coordinator/specialist, PharmDs and RNs.  CRITICAL CARE Performed by: Glori Bickers  CRITICAL CARE Performed by: Glori Bickers  Total critical care time: 40 minutes  Critical care time was exclusive of separately billable procedures and treating other patients.  Critical care was necessary to treat or prevent imminent or life-threatening deterioration.  Critical care was time spent personally by me (independent of midlevel providers or residents) on the following activities: development of treatment plan with patient and/or surrogate as well as nursing, discussions with consultants, evaluation of patient's response to treatment, examination of patient, obtaining history from patient or surrogate, ordering and performing treatments and interventions, ordering and review of laboratory studies, ordering and review of radiographic studies, pulse oximetry and re-evaluation of patient's condition.    Length of Stay: 17   Glori Bickers MD 09/19/2020, 7:35 AM  Advanced Heart Failure Team Pager [phone] (M-F; [place])  Please contact [facility] for night-coverage after hours (4p -7a ) and weekends on [email or web address]

## 2020-09-19 NOTE — Procedures (Signed)
Pt was seen and examined while on plasmapheresis and tolerating it well.  Today is treatment #4 of 5 for hemolysis related to COVID-19/ECMO.

## 2020-09-19 NOTE — Procedures (Signed)
Extracorporeal support note     ECLS cannulation: 09-16-20 Last circuit change: 09/11/2020 Indication: COVID ARDS   Configuration: VV, RIJ 32Fr Crescent   Pump speed: 3800 Pump flow: 5.1 Pump used: Cardiohelp   Sweep gas: 100%, 13LPM   Circuit check: 1 small clot in corner Anticoagulant: bivalirudin Anticoagulation targets: PTT 50-60    Changes in support:  -attempting to wean sweep to 11 today.  -merrem to complete today -remains on free water at this time nephro changed to 200 q4 -sodium remained stable despite 4L diuresis, cont hctz -cont diuresis -holding anti-htn prior to plex -Fibrinogen improved to 104 -LDH up to 544 -cont steroid wean as tolerated -change ppi to BID    Anticipated goals/duration of support: bridge to recovery   Discussed care with ECMO team, Palliative Care, RN, pharmacy.  Briant Sites, DO 09/19/20 8:42 AM [facility]

## 2020-09-19 NOTE — Progress Notes (Signed)
Updated wife via phone  All questions answered to the best of my ability.

## 2020-09-19 NOTE — Progress Notes (Signed)
ANTICOAGULATION CONSULT NOTE  Pharmacy Consult for bivalirudin Indication: ECMO + bilateral DVTs   Recent Labs    09/17/20 0454 09/17/20 0503 09/18/20 0312 09/18/20 0434 09/18/20 1619 09/18/20 1631 09/19/20 0412 09/19/20 0418 09/19/20 1204 09/19/20 1533 09/19/20 1539 09/19/20 1619  HGB 8.8*   < >  --    < > 9.0*   < > 9.5*   < > 9.5* 9.0* 9.2*  --   HCT 28.1*   < >  --    < > 28.0*   < > 28.5*   < > 28.0* 28.5* 27.0*  --   PLT 123*   < >  --    < > 135*  --  155  --   --  139*  --   --   APTT 56*   < > 51*  --  51*  --  49*  --   --  88*  --  87*  LABPROT 29.8*  --  26.1*  --   --   --  21.8*  --   --   --   --   --   INR 3.0*  --  2.5*  --   --   --  2.0*  --   --   --   --   --   CREATININE 1.30*   < > 1.35*  --  1.36*  --  1.34*  --   --  1.28*  --   --    < > = values in this interval not displayed.    Estimated Creatinine Clearance: 79.1 mL/min (A) (by C-G formula based on SCr of 1.28 mg/dL (H)).   Assessment: 78 yom unvaccinated presenting with severe COVID ARDs, intubated on 12/6, continued to have ventilation issues despite NMB and proning - started on VV ECMO on 12/8.  D-dimer >20 > now improved 14, found to have bilateral DVTs.  Given significant bleeding issues from trach, targeting lower aPTT goal of 50-55 seconds after discussion with ECMO team.   aPTT this PM is 87.  No overt bleeding or complications noted.   Goal of Therapy:  aPTT 50-60 seconds Monitor platelets by anticoagulation protocol: Yes   Plan:  Decrease bivalirudin to 0.12 mg/kg/hr. Check q12h aPTT Follow bleeding, CBC  Jeanella Cara, PharmD, [facility] Clinical Pharmacist Please see AMION for all Pharmacists' Contact Phone Numbers 09/19/2020, 5:18 PM

## 2020-09-19 NOTE — Progress Notes (Signed)
Patient ID: Donald Rowe, male   DOB: 1961/08/15, 59 y.o.   MRN: [id number] S: Brisk auto diuresis overnight.  Off of pressors this morning. O:BP (!) 171/66   Pulse (!) 106   Temp 98.24 F (36.8 C) (Core)   Resp 18   Ht $R'5\' 9"'fJ$  (1.753 m)   Wt 118.9 kg   SpO2 99%   BMI 38.71 kg/m   Intake/Output Summary (Last 24 hours) at 09/19/2020 0935 Last data filed at 09/19/2020 0800 Gross per 24 hour  Intake 4879.21 ml  Output 9290 ml  Net -4410.79 ml   Intake/Output: I/O last 3 completed shifts: In: 8937.3 [I.V.:3654.6; Blood:315; JX/BJ:4782.9; IV Piggyback:800] Out: 56213 [YQMVH:84696; Stool:700]  Intake/Output this shift:  Total I/O In: 168.4 [I.V.:98.4; NG/GT:70] Out: -  Weight change: -3.7 kg Gen: on vent via trach, sedated CVS: tachy at 106 Resp: distant breath sounds bilaterally Abd: obese, +BS, soft Ext: 1+ anasarca  Recent Labs  Lab 09/13/20 0304 09/13/20 0325 09/14/20 0352 09/14/20 0401 09/15/20 0312 09/15/20 0326 09/16/20 0357 09/16/20 0405 09/16/20 1145 09/16/20 1223 09/16/20 1615 09/16/20 1742 09/17/20 0454 09/17/20 0503 09/17/20 1722 09/17/20 1724 09/18/20 0312 09/18/20 0434 09/18/20 1619 09/18/20 1631 09/18/20 2001 09/19/20 0412 09/19/20 0418  NA 147*   < > 146*   < > 149*   < > 148*   < > 149*   < > 149*   < > 148*   < > 150*   < > 148* 152* 150* 151* 151* 150* 150*  K 5.0   < > 4.9   < > 4.8   < > 5.0   < > 4.8   < > 5.2*   < > 4.9   < > 5.1   < > 5.2* 5.1 4.5 4.5 4.2 4.9 4.9  CL 111   < > 113*   < > 117*   < > 118*  --  121*  --  122*  --  121*  --  121*  --  119*  --  119*  --   --  118*  --   CO2 26   < > 23   < > 23   < > 22  --  21*  --  20*  --  21*  --  22  --  23  --  22  --   --  25  --   GLUCOSE 215*   < > 205*   < > 215*   < > 215*  --  174*  --  204*  --  180*  --  204*  --  190*  --  128*  --   --  160*  --   BUN 75*   < > 73*   < > 82*   < > 84*  --  80*  --  79*  --  87*  --  92*  --  95*  --  92*  --   --  93*  --   CREATININE 1.61*    < > 1.46*   < > 1.55*   < > 1.35*  --  1.26*  --  1.32*  --  1.30*  --  1.37*  --  1.35*  --  1.36*  --   --  1.34*  --   ALBUMIN 1.9*  --  3.1*  --  2.7*  --  3.0*  --   --   --   --   --  3.3*  --   --   --  2.8*  --   --   --   --  2.8*  --   CALCIUM 8.4*   < > 7.9*   < > 8.0*   < > 7.8*  --  7.4*  --  7.6*  --  7.7*  --  7.7*  --  7.8*  --  8.1*  --   --  8.4*  --   AST 56*  --  38  --  44*  --  31  --   --   --   --   --  40  --   --   --  39  --   --   --   --  133*  --   ALT 36  --  27  --  37  --  27  --   --   --   --   --  39  --   --   --  50*  --   --   --   --  159*  --    < > = values in this interval not displayed.   Liver Function Tests: Recent Labs  Lab 09/17/20 0454 09/18/20 0312 09/19/20 0412  AST 40 39 133*  ALT 39 50* 159*  ALKPHOS 70 87 110  BILITOT 2.8* 2.7* 5.0*  PROT 4.2* 3.9* 4.2*  ALBUMIN 3.3* 2.8* 2.8*   No results for input(s): LIPASE, AMYLASE in the last 168 hours. Recent Labs  Lab 09/14/20 0821  AMMONIA 48*   CBC: Recent Labs  Lab 09/17/20 1722 09/17/20 1724 09/17/20 2241 09/18/20 0434 09/18/20 0436 09/18/20 1619 09/18/20 1631 09/18/20 2001 09/19/20 0412 09/19/20 0418  WBC 12.9*  --  11.9*  --  12.6* 13.5*  --   --  16.6*  --   HGB 8.2*   < > 7.5*   < > 8.8* 9.0*   < > 8.5* 9.5* 8.5*  HCT 25.5*   < > 23.6*   < > 27.5* 28.0*   < > 25.0* 28.5* 25.0*  MCV 96.2  --  96.7  --  96.2 94.9  --   --  95.6  --   PLT 122*  --  112*  --  122* 135*  --   --  155  --    < > = values in this interval not displayed.   Cardiac Enzymes: No results for input(s): CKTOTAL, CKMB, CKMBINDEX, TROPONINI in the last 168 hours. CBG: Recent Labs  Lab 09/18/20 1142 09/18/20 1559 09/18/20 2000 09/18/20 2333 09/19/20 0818  GLUCAP 127* 102* 136* 125* 114*    Iron Studies: No results for input(s): IRON, TIBC, TRANSFERRIN, FERRITIN in the last 72 hours. Studies/Results: DG CHEST PORT 1 VIEW  Result Date: 09/19/2020 CLINICAL DATA:  ECMO.  COVID. EXAM:  PORTABLE CHEST 1 VIEW COMPARISON:  09/18/2020. FINDINGS: Interim removal of left IJ line. Tracheostomy tube, feeding tube, right PICC line, and ECMO device in stable position. Heart size stable. Diffuse severe bilateral pulmonary infiltrates are again noted without interim change. Bilateral layering pleural effusions again cannot be excluded. No pneumothorax. IMPRESSION: 1. Interim removal of left IJ line. Remaining lines and tubes including ECMO device in stable position. 2. Diffuse severe bilateral pulmonary infiltrates are again noted without interim change. Bilateral layering pleural effusions again cannot be excluded. Electronically Signed   By: Marcello Moores  Register   On: 09/19/2020 06:44   DG CHEST PORT 1 VIEW  Result Date: 09/18/2020 CLINICAL DATA:  Tracheostomy.  ECMO.  Recent COVID. EXAM: PORTABLE CHEST 1 VIEW COMPARISON:  09/17/2020. FINDINGS: Tracheostomy tube, left IJ line, feeding tube, right PICC line, ECMO device in stable position. Heart size normal. Diffuse dense bilateral pulmonary infiltrates are again noted. Very slight improvement in aeration may be present. Bilateral layering pleural effusions again cannot be excluded. No pneumothorax. IMPRESSION: 1. Lines and tubes in stable position. 2. Diffuse dense bilateral pulmonary infiltrates are again noted. Very slight improvement in aeration may be present. Bilateral layering pleural effusions again cannot be excluded. Electronically Signed   By: Maisie Fus  Register   On: 09/18/2020 06:39   . amLODipine  10 mg Per Tube Daily  . aspirin  81 mg Per Tube Daily  . calcium gluconate  2 g Intravenous Once  . chlorhexidine gluconate (MEDLINE KIT)  15 mL Mouth Rinse BID  . Chlorhexidine Gluconate Cloth  6 each Topical Daily  . clonazePAM  2 mg Per Tube BID  . cloNIDine  0.2 mg Per Tube TID  . docusate  100 mg Per Tube BID  . feeding supplement (PROSource TF)  45 mL Per Tube TID  . fiber  1 packet Per Tube BID  . free water  200 mL Per Tube Q4H  .  furosemide  40 mg Intravenous BID  . heparin sodium (porcine)      . hydrALAZINE  25 mg Per Tube Q8H  . hydrochlorothiazide  25 mg Oral Daily  . insulin aspart  10 Units Subcutaneous Q4H  . insulin aspart  2-6 Units Subcutaneous Q4H  . insulin detemir  38 Units Subcutaneous Q12H  . lactulose  20 g Per Tube BID  . levothyroxine  50 mcg Per Tube Q0600  . mouth rinse  15 mL Mouth Rinse 10 times per day  . methylPREDNISolone (SOLU-MEDROL) injection  60 mg Intravenous Q12H  . metoprolol tartrate  50 mg Per Tube TID PC  . oxyCODONE  15 mg Per Tube Q6H  . pantoprazole sodium  40 mg Per Tube BID  . pravastatin  40 mg Per Tube q1800  . QUEtiapine  100 mg Per Tube BID  . sodium chloride flush  10-40 mL Intracatheter Q12H    BMET    Component Value Date/Time   NA 150 (H) 09/19/2020 0418   K 4.9 09/19/2020 0418   CL 118 (H) 09/19/2020 0412   CO2 25 09/19/2020 0412   GLUCOSE 160 (H) 09/19/2020 0412   BUN 93 (H) 09/19/2020 0412   CREATININE 1.34 (H) 09/19/2020 0412   CALCIUM 8.4 (L) 09/19/2020 0412   GFRNONAA >60 09/19/2020 0412   CBC    Component Value Date/Time   WBC 16.6 (H) 09/19/2020 0412   RBC 2.98 (L) 09/19/2020 0412   HGB 8.5 (L) 09/19/2020 0418   HCT 25.0 (L) 09/19/2020 0418   PLT 155 09/19/2020 0412   MCV 95.6 09/19/2020 0412   MCH 31.9 09/19/2020 0412   MCHC 33.3 09/19/2020 0412   RDW 19.1 (H) 09/19/2020 0412   LYMPHSABS 0.4 (L) 09/07/2020 0438   MONOABS 0.5 09/07/2020 0438   EOSABS 0.0 09/07/2020 0438   BASOSABS 0.0 09/07/2020 0438     Assessment/Plan:  1. AKI- presumably due to ischemic ATN associted with covid-19 infection.  Scr stable at 1.3 and remains non-oliguric. Azotemia due to corticosteroids, tube feeds, diuretics, and hemolysis 2. 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  Continue to follow LDH and H/H. 1. Had hypotension during plasmapheresis  on  09/16/20 requiring neo-synephrine.  2. Right femoral HD catheter placed 09/13/20 by PCCM. 3. For PLEX treatment #4/5 today.   4. LDH up slightly from 434 to 544 but down from peak of 1569 on 09/11/20. 3. Acute hypoxic respiratory failure- secondary to covid-19 infection on VV ECMO  1. S/p trach 09/14/20 4. Hypernatremia- will increase free water volume and follow.  5. 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 HCAP/MRSA pneumonitis/tracheitis- on IV vancomycin.   Donetta Potts, MD [employer] [phone]

## 2020-09-20 ENCOUNTER — Inpatient Hospital Stay (HOSPITAL_COMMUNITY): Payer: HRSA Program

## 2020-09-20 DIAGNOSIS — J9601 Acute respiratory failure with hypoxia: Secondary | ICD-10-CM | POA: Diagnosis not present

## 2020-09-20 DIAGNOSIS — Z515 Encounter for palliative care: Secondary | ICD-10-CM

## 2020-09-20 DIAGNOSIS — J9600 Acute respiratory failure, unspecified whether with hypoxia or hypercapnia: Secondary | ICD-10-CM | POA: Diagnosis not present

## 2020-09-20 DIAGNOSIS — J1282 Pneumonia due to coronavirus disease 2019: Secondary | ICD-10-CM | POA: Diagnosis not present

## 2020-09-20 DIAGNOSIS — U071 COVID-19: Secondary | ICD-10-CM | POA: Diagnosis not present

## 2020-09-20 DIAGNOSIS — Z7189 Other specified counseling: Secondary | ICD-10-CM

## 2020-09-20 LAB — APTT
aPTT: 59 seconds — ABNORMAL HIGH (ref 24–36)
aPTT: 53 seconds — ABNORMAL HIGH (ref 24–36)

## 2020-09-20 LAB — POCT I-STAT 7, (LYTES, BLD GAS, ICA,H+H)
Acid-Base Excess: 3 mmol/L — ABNORMAL HIGH (ref 0.0–2.0)
Acid-Base Excess: 2 mmol/L (ref 0.0–2.0)
Acid-Base Excess: 1 mmol/L (ref 0.0–2.0)
Acid-Base Excess: 2 mmol/L (ref 0.0–2.0)
Acid-Base Excess: 2 mmol/L (ref 0.0–2.0)
Acid-Base Excess: 2 mmol/L (ref 0.0–2.0)
Acid-Base Excess: 3 mmol/L — ABNORMAL HIGH (ref 0.0–2.0)
Bicarbonate: 28 mmol/L (ref 20.0–28.0)
Bicarbonate: 27.7 mmol/L (ref 20.0–28.0)
Bicarbonate: 27.4 mmol/L (ref 20.0–28.0)
Bicarbonate: 28.6 mmol/L — ABNORMAL HIGH (ref 20.0–28.0)
Bicarbonate: 28.5 mmol/L — ABNORMAL HIGH (ref 20.0–28.0)
Bicarbonate: 28.6 mmol/L — ABNORMAL HIGH (ref 20.0–28.0)
Bicarbonate: 30.7 mmol/L — ABNORMAL HIGH (ref 20.0–28.0)
Calcium, Ion: 1.26 mmol/L (ref 1.15–1.40)
Calcium, Ion: 1.27 mmol/L (ref 1.15–1.40)
Calcium, Ion: 1.28 mmol/L (ref 1.15–1.40)
Calcium, Ion: 1.27 mmol/L (ref 1.15–1.40)
Calcium, Ion: 1.25 mmol/L (ref 1.15–1.40)
Calcium, Ion: 1.26 mmol/L (ref 1.15–1.40)
Calcium, Ion: 1.31 mmol/L (ref 1.15–1.40)
HCT: 24 % — ABNORMAL LOW (ref 39.0–52.0)
HCT: 23 % — ABNORMAL LOW (ref 39.0–52.0)
HCT: 21 % — ABNORMAL LOW (ref 39.0–52.0)
HCT: 23 % — ABNORMAL LOW (ref 39.0–52.0)
HCT: 30 % — ABNORMAL LOW (ref 39.0–52.0)
HCT: 27 % — ABNORMAL LOW (ref 39.0–52.0)
HCT: 28 % — ABNORMAL LOW (ref 39.0–52.0)
Hemoglobin: 8.2 g/dL — ABNORMAL LOW (ref 13.0–17.0)
Hemoglobin: 7.8 g/dL — ABNORMAL LOW (ref 13.0–17.0)
Hemoglobin: 7.1 g/dL — ABNORMAL LOW (ref 13.0–17.0)
Hemoglobin: 7.8 g/dL — ABNORMAL LOW (ref 13.0–17.0)
Hemoglobin: 10.2 g/dL — ABNORMAL LOW (ref 13.0–17.0)
Hemoglobin: 9.2 g/dL — ABNORMAL LOW (ref 13.0–17.0)
Hemoglobin: 9.5 g/dL — ABNORMAL LOW (ref 13.0–17.0)
O2 Saturation: 94 %
O2 Saturation: 96 %
O2 Saturation: 98 %
O2 Saturation: 96 %
O2 Saturation: 93 %
O2 Saturation: 97 %
O2 Saturation: 95 %
Patient temperature: 37
Patient temperature: 37
Patient temperature: 37
Patient temperature: 36.9
Patient temperature: 37.1
Patient temperature: 37.1
Patient temperature: 36.9
Potassium: 4.9 mmol/L (ref 3.5–5.1)
Potassium: 5.1 mmol/L (ref 3.5–5.1)
Potassium: 4.8 mmol/L (ref 3.5–5.1)
Potassium: 4.9 mmol/L (ref 3.5–5.1)
Potassium: 5.2 mmol/L — ABNORMAL HIGH (ref 3.5–5.1)
Potassium: 4.8 mmol/L (ref 3.5–5.1)
Potassium: 4.8 mmol/L (ref 3.5–5.1)
Sodium: 153 mmol/L — ABNORMAL HIGH (ref 135–145)
Sodium: 152 mmol/L — ABNORMAL HIGH (ref 135–145)
Sodium: 152 mmol/L — ABNORMAL HIGH (ref 135–145)
Sodium: 151 mmol/L — ABNORMAL HIGH (ref 135–145)
Sodium: 150 mmol/L — ABNORMAL HIGH (ref 135–145)
Sodium: 150 mmol/L — ABNORMAL HIGH (ref 135–145)
Sodium: 151 mmol/L — ABNORMAL HIGH (ref 135–145)
TCO2: 29 mmol/L (ref 22–32)
TCO2: 29 mmol/L (ref 22–32)
TCO2: 29 mmol/L (ref 22–32)
TCO2: 30 mmol/L (ref 22–32)
TCO2: 30 mmol/L (ref 22–32)
TCO2: 30 mmol/L (ref 22–32)
TCO2: 33 mmol/L — ABNORMAL HIGH (ref 22–32)
pCO2 arterial: 46.4 mmHg (ref 32.0–48.0)
pCO2 arterial: 47.5 mmHg (ref 32.0–48.0)
pCO2 arterial: 51.4 mmHg — ABNORMAL HIGH (ref 32.0–48.0)
pCO2 arterial: 52.6 mmHg — ABNORMAL HIGH (ref 32.0–48.0)
pCO2 arterial: 50.7 mmHg — ABNORMAL HIGH (ref 32.0–48.0)
pCO2 arterial: 53.3 mmHg — ABNORMAL HIGH (ref 32.0–48.0)
pCO2 arterial: 61.6 mmHg — ABNORMAL HIGH (ref 32.0–48.0)
pH, Arterial: 7.388 (ref 7.350–7.450)
pH, Arterial: 7.374 (ref 7.350–7.450)
pH, Arterial: 7.335 — ABNORMAL LOW (ref 7.350–7.450)
pH, Arterial: 7.343 — ABNORMAL LOW (ref 7.350–7.450)
pH, Arterial: 7.359 (ref 7.350–7.450)
pH, Arterial: 7.339 — ABNORMAL LOW (ref 7.350–7.450)
pH, Arterial: 7.305 — ABNORMAL LOW (ref 7.350–7.450)
pO2, Arterial: 72 mmHg — ABNORMAL LOW (ref 83.0–108.0)
pO2, Arterial: 84 mmHg (ref 83.0–108.0)
pO2, Arterial: 109 mmHg — ABNORMAL HIGH (ref 83.0–108.0)
pO2, Arterial: 91 mmHg (ref 83.0–108.0)
pO2, Arterial: 70 mmHg — ABNORMAL LOW (ref 83.0–108.0)
pO2, Arterial: 100 mmHg (ref 83.0–108.0)
pO2, Arterial: 88 mmHg (ref 83.0–108.0)

## 2020-09-20 LAB — CBC
HCT: 26 % — ABNORMAL LOW (ref 39.0–52.0)
HCT: 31.2 % — ABNORMAL LOW (ref 39.0–52.0)
Hemoglobin: 8.7 g/dL — ABNORMAL LOW (ref 13.0–17.0)
Hemoglobin: 10.3 g/dL — ABNORMAL LOW (ref 13.0–17.0)
MCH: 32.6 pg (ref 26.0–34.0)
MCH: 30.5 pg (ref 26.0–34.0)
MCHC: 33.5 g/dL (ref 30.0–36.0)
MCHC: 33 g/dL (ref 30.0–36.0)
MCV: 97.4 fL (ref 80.0–100.0)
MCV: 92.3 fL (ref 80.0–100.0)
Platelets: 146 10*3/uL — ABNORMAL LOW (ref 150–400)
Platelets: 150 10*3/uL (ref 150–400)
RBC: 2.67 MIL/uL — ABNORMAL LOW (ref 4.22–5.81)
RBC: 3.38 MIL/uL — ABNORMAL LOW (ref 4.22–5.81)
RDW: 19.1 % — ABNORMAL HIGH (ref 11.5–15.5)
RDW: 20.4 % — ABNORMAL HIGH (ref 11.5–15.5)
WBC: 17.9 10*3/uL — ABNORMAL HIGH (ref 4.0–10.5)
WBC: 18.7 10*3/uL — ABNORMAL HIGH (ref 4.0–10.5)
nRBC: 0.6 % — ABNORMAL HIGH (ref 0.0–0.2)
nRBC: 0.3 % — ABNORMAL HIGH (ref 0.0–0.2)

## 2020-09-20 LAB — LACTATE DEHYDROGENASE: LDH: 419 U/L — ABNORMAL HIGH (ref 98–192)

## 2020-09-20 LAB — FIBRINOGEN: Fibrinogen: 121 mg/dL — ABNORMAL LOW (ref 210–475)

## 2020-09-20 LAB — PROTIME-INR
INR: 2.8 — ABNORMAL HIGH (ref 0.8–1.2)
Prothrombin Time: 28.2 seconds — ABNORMAL HIGH (ref 11.4–15.2)

## 2020-09-20 LAB — HEPATIC FUNCTION PANEL
ALT: 77 U/L — ABNORMAL HIGH (ref 0–44)
AST: 58 U/L — ABNORMAL HIGH (ref 15–41)
Albumin: 3.5 g/dL (ref 3.5–5.0)
Alkaline Phosphatase: 66 U/L (ref 38–126)
Bilirubin, Direct: 1.3 mg/dL — ABNORMAL HIGH (ref 0.0–0.2)
Indirect Bilirubin: 1.6 mg/dL — ABNORMAL HIGH (ref 0.3–0.9)
Total Bilirubin: 2.9 mg/dL — ABNORMAL HIGH (ref 0.3–1.2)
Total Protein: 4.3 g/dL — ABNORMAL LOW (ref 6.5–8.1)

## 2020-09-20 LAB — BASIC METABOLIC PANEL
Anion gap: 7 (ref 5–15)
Anion gap: 8 (ref 5–15)
BUN: 94 mg/dL — ABNORMAL HIGH (ref 6–20)
BUN: 91 mg/dL — ABNORMAL HIGH (ref 6–20)
CO2: 25 mmol/L (ref 22–32)
CO2: 27 mmol/L (ref 22–32)
Calcium: 8.2 mg/dL — ABNORMAL LOW (ref 8.9–10.3)
Calcium: 8.4 mg/dL — ABNORMAL LOW (ref 8.9–10.3)
Chloride: 117 mmol/L — ABNORMAL HIGH (ref 98–111)
Chloride: 114 mmol/L — ABNORMAL HIGH (ref 98–111)
Creatinine, Ser: 1.28 mg/dL — ABNORMAL HIGH (ref 0.61–1.24)
Creatinine, Ser: 1.21 mg/dL (ref 0.61–1.24)
GFR, Estimated: >60 mL/min (ref >60)
GFR, Estimated: >60 mL/min (ref >60)
Glucose, Bld: 141 mg/dL — ABNORMAL HIGH (ref 70–99)
Glucose, Bld: 151 mg/dL — ABNORMAL HIGH (ref 70–99)
Potassium: 4.9 mmol/L (ref 3.5–5.1)
Potassium: 5.2 mmol/L — ABNORMAL HIGH (ref 3.5–5.1)
Sodium: 149 mmol/L — ABNORMAL HIGH (ref 135–145)
Sodium: 149 mmol/L — ABNORMAL HIGH (ref 135–145)

## 2020-09-20 LAB — GLUCOSE, CAPILLARY
Glucose-Capillary: 110 mg/dL — ABNORMAL HIGH (ref 70–99)
Glucose-Capillary: 132 mg/dL — ABNORMAL HIGH (ref 70–99)
Glucose-Capillary: 112 mg/dL — ABNORMAL HIGH (ref 70–99)
Glucose-Capillary: 63 mg/dL — ABNORMAL LOW (ref 70–99)
Glucose-Capillary: 69 mg/dL — ABNORMAL LOW (ref 70–99)
Glucose-Capillary: 94 mg/dL (ref 70–99)
Glucose-Capillary: 152 mg/dL — ABNORMAL HIGH (ref 70–99)
Glucose-Capillary: 107 mg/dL — ABNORMAL HIGH (ref 70–99)

## 2020-09-20 LAB — ASPERGILLUS ANTIGEN, BAL/SERUM: Aspergillus Ag, BAL/Serum: 0.05 Index (ref 0.00–0.49)

## 2020-09-20 LAB — LACTIC ACID, PLASMA
Lactic Acid, Venous: 1 mmol/L (ref 0.5–1.9)
Lactic Acid, Venous: 1 mmol/L (ref 0.5–1.9)

## 2020-09-20 LAB — PREPARE RBC (CROSSMATCH)

## 2020-09-20 MED ORDER — SODIUM CHLORIDE 0.9% IV SOLUTION: Freq: Once | INTRAVENOUS | Status: DC

## 2020-09-20 MED ORDER — HYDRALAZINE HCL 25 MG PO TABS
25.0000 mg | ORAL_TABLET | Freq: Three times a day (TID) | ORAL | Status: DC
  Administered 2020-09-20 – 2020-09-23 (×8): 25 mg
  Filled 2020-09-20 (×8): qty 1

## 2020-09-20 MED ORDER — PHENYLEPHRINE 40 MCG/ML (10ML) SYRINGE FOR IV PUSH (FOR BLOOD PRESSURE SUPPORT)
80.0000 ug | PREFILLED_SYRINGE | Freq: Once | INTRAVENOUS | Status: AC | PRN
  Administered 2020-10-06: 400 ug via INTRAVENOUS
  Filled 2020-09-20: qty 10

## 2020-09-20 MED ORDER — NOREPINEPHRINE 4 MG/250ML-% IV SOLN
INTRAVENOUS | Status: AC
  Administered 2020-09-20: 09:00:00 4 mg
  Filled 2020-09-20: qty 250

## 2020-09-20 MED ORDER — DEXTROSE 50 % IV SOLN
INTRAVENOUS | Status: AC
  Administered 2020-09-20: 12:00:00 25 mL
  Filled 2020-09-20: qty 50

## 2020-09-20 MED ORDER — PHENYLEPHRINE 40 MCG/ML (10ML) SYRINGE FOR IV PUSH (FOR BLOOD PRESSURE SUPPORT)
PREFILLED_SYRINGE | INTRAVENOUS | Status: AC
  Administered 2020-09-20: 10:00:00 400 ug
  Filled 2020-09-20: qty 10

## 2020-09-20 MED ORDER — AMLODIPINE BESYLATE 10 MG PO TABS
10.0000 mg | ORAL_TABLET | Freq: Every day | ORAL | Status: DC
  Administered 2020-09-21 – 2020-10-05 (×14): 10 mg
  Filled 2020-09-20 (×15): qty 1

## 2020-09-20 MED ORDER — HYDROCHLOROTHIAZIDE 25 MG PO TABS
25.0000 mg | ORAL_TABLET | Freq: Every day | ORAL | Status: DC
Start: 2020-09-21 — End: 2020-09-21

## 2020-09-20 MED ORDER — NALOXONE HCL 0.4 MG/ML IJ SOLN
INTRAMUSCULAR | Status: AC
  Administered 2020-09-20: 11:00:00 0.4 mg
  Filled 2020-09-20: qty 1

## 2020-09-20 MED ORDER — METHYLPREDNISOLONE SODIUM SUCC 40 MG IJ SOLR
40.0000 mg | Freq: Two times a day (BID) | INTRAMUSCULAR | Status: DC
  Administered 2020-09-20 (×2): 40 mg via INTRAVENOUS
  Filled 2020-09-20 (×2): qty 1

## 2020-09-20 MED ORDER — NOREPINEPHRINE 16 MG/250ML-% IV SOLN
0.0000 ug/min | INTRAVENOUS | Status: DC
  Administered 2020-09-23: 21:00:00 4 ug/min via INTRAVENOUS
  Filled 2020-09-20: qty 250

## 2020-09-20 MED ORDER — SODIUM ZIRCONIUM CYCLOSILICATE 5 G PO PACK
5.0000 g | PACK | Freq: Once | ORAL | Status: AC
  Administered 2020-09-20: 18:00:00 5 g
  Filled 2020-09-20: qty 1

## 2020-09-20 NOTE — Progress Notes (Signed)
PT Cancellation Note  Patient Details Name: Donald Rowe MRN: [id number] DOB: June 20, 1961   Cancelled Treatment:    Reason Eval/Treat Not Completed: Medical issues which prohibited therapy (BP issues today. Will check back tomorrow.)   Berline Lopes 09/20/2020, 12:07 PM  Krysti Hickling W,PT Acute Rehabilitation Services Pager:  [phone]  Office:  [phone]

## 2020-09-20 NOTE — Progress Notes (Signed)
Patient ID: Donald Rowe, male   DOB: August 13, 1961, 59 y.o.   MRN: [id number]    Advanced Heart Failure Rounding Note   Subjective:    12/8 Cannulated for VV ECMO - 32 FR  RIJ Crescent 12/9 Extubated/Reintubated for altered mental status 12/13 Circuit change for elevated LDH 12/15 PLEX started 12/16 Tracheostomy  Relatively stable overnight. Bleeding from trach has stopped. Remains sedated due to severe agitation Circuit stable.   Continues to diurese well. Out over 9L. Sweep Weaned down to 8.   Had PLEX 4/5 yesterday - day 4/5. LDH 434 -> 544 -> 419.   ECMO   Speed 3300 Flow 4.7L Sweep 8 dP 19 pVen  -77  ABG: 7.39/46/72/94% Hgb 8.7 LDH 434 -> 544 -> 419 Lactic acid 1.0 PTT 59  Vent 50% TVs 400s   Objective:   Weight Range:  Vital Signs:   Temp:  [97.88 F (36.6 C)-98.78 F (37.1 C)] 98.42 F (36.9 C) (12/22 0600) Pulse Rate:  [88-123] 110 (12/22 0600) Resp:  [9-24] 13 (12/22 0600) BP: (132-199)/(49-73) 134/65 (12/21 1600) SpO2:  [93 %-100 %] 98 % (12/22 0600) Arterial Line BP: (111-199)/(37-73) 158/56 (12/22 0600) FiO2 (%):  [50 %-60 %] 50 % (12/22 0413) Weight:  [114.6 kg-119 kg] 114.6 kg (12/22 0424) Last BM Date: 09/19/20  Weight change: Filed Weights   09/19/20 0600 09/19/20 0954 09/20/20 0424  Weight: 118.9 kg 119 kg 114.6 kg    Intake/Output:   Intake/Output Summary (Last 24 hours) at 09/20/2020 0758 Last data filed at 09/20/2020 0700 Gross per 24 hour  Intake 5901.97 ml  Output 9300 ml  Net -3398.03 ml     Physical Exam: General:  Sedated. Will awaken and be agitated HEENT: normal Neck: supple. + RIJ ECMO cannula  + trachCarotids 2+ bilat; no bruits. No lymphadenopathy or thryomegaly appreciated. Cor: PMI nondisplaced. Regular rate & rhythm. No rubs, gallops or murmurs. Lungs: coarse Abdomen: obese soft, nontender, nondistended. No hepatosplenomegaly. No bruits or masses. Good bowel sounds. Extremities: no cyanosis, clubbing, rash, 2+  edema with TED hose  RFV vas cath Neuro: intubated sedatd    Telemetry: sinus 100-110 Personally reviewed    Labs: Basic Metabolic Panel: Recent Labs  Lab 09/18/20 0312 09/18/20 0434 09/18/20 1619 09/18/20 1631 09/19/20 0412 09/19/20 0418 09/19/20 1204 09/19/20 1533 09/19/20 1539 09/19/20 1935 09/20/20 0322  NA 148*   < > 150*   < > 150*   < > 152* 149* 151* 152* 149*   153*  K 5.2*   < > 4.5   < > 4.9   < > 4.7 5.0 4.9 4.6 4.9   4.9  CL 119*  --  119*  --  118*  --   --  118*  --   --  117*  CO2 23  --  22  --  25  --   --  23  --   --  25  GLUCOSE 190*  --  128*  --  160*  --   --  166*  --   --  141*  BUN 95*  --  92*  --  93*  --   --  87*  --   --  94*  CREATININE 1.35*  --  1.36*  --  1.34*  --   --  1.28*  --   --  1.28*  CALCIUM 7.8*  --  8.1*  --  8.4*  --   --  7.9*  --   --  8.2*   < > = values in this interval not displayed.    Liver Function Tests: Recent Labs  Lab 09/16/20 0357 09/17/20 0454 09/18/20 0312 09/19/20 0412 09/20/20 0322  AST 31 40 39 133* 58*  ALT 27 39 50* 159* 77*  ALKPHOS 73 70 87 110 66  BILITOT 2.4* 2.8* 2.7* 5.0* 2.9*  PROT 4.0* 4.2* 3.9* 4.2* 4.3*  ALBUMIN 3.0* 3.3* 2.8* 2.8* 3.5   No results for input(s): LIPASE, AMYLASE in the last 168 hours. Recent Labs  Lab 09/14/20 0821  AMMONIA 48*    CBC: Recent Labs  Lab 09/18/20 0436 09/18/20 1619 09/18/20 1631 09/19/20 0412 09/19/20 0418 09/19/20 1204 09/19/20 1533 09/19/20 1539 09/19/20 1935 09/20/20 0322  WBC 12.6* 13.5*  --  16.6*  --   --  15.8*  --   --  17.9*  HGB 8.8* 9.0*   < > 9.5*   < > 9.5* 9.0* 9.2* 8.2* 8.7*   8.2*  HCT 27.5* 28.0*   < > 28.5*   < > 28.0* 28.5* 27.0* 24.0* 26.0*   24.0*  MCV 96.2 94.9  --  95.6  --   --  97.9  --   --  97.4  PLT 122* 135*  --  155  --   --  139*  --   --  146*   < > = values in this interval not displayed.    Cardiac Enzymes: No results for input(s): CKTOTAL, CKMB, CKMBINDEX, TROPONINI in the last 168  hours.  BNP: BNP (last 3 results) No results for input(s): BNP in the last 8760 hours.  ProBNP (last 3 results) No results for input(s): PROBNP in the last 8760 hours.    Other results:  Imaging: DG CHEST PORT 1 VIEW  Result Date: 09/19/2020 CLINICAL DATA:  ECMO.  COVID. EXAM: PORTABLE CHEST 1 VIEW COMPARISON:  09/18/2020. FINDINGS: Interim removal of left IJ line. Tracheostomy tube, feeding tube, right PICC line, and ECMO device in stable position. Heart size stable. Diffuse severe bilateral pulmonary infiltrates are again noted without interim change. Bilateral layering pleural effusions again cannot be excluded. No pneumothorax. IMPRESSION: 1. Interim removal of left IJ line. Remaining lines and tubes including ECMO device in stable position. 2. Diffuse severe bilateral pulmonary infiltrates are again noted without interim change. Bilateral layering pleural effusions again cannot be excluded. Electronically Signed   By: Marcello Moores  Register   On: 09/19/2020 06:44     Medications:     Scheduled Medications:  amLODipine  10 mg Per Tube Daily   aspirin  81 mg Per Tube Daily   calcium gluconate  2 g Intravenous Once   chlorhexidine gluconate (MEDLINE KIT)  15 mL Mouth Rinse BID   Chlorhexidine Gluconate Cloth  6 each Topical Daily   clonazePAM  2 mg Per Tube BID   cloNIDine  0.2 mg Per Tube TID   docusate  100 mg Per Tube BID   feeding supplement (PROSource TF)  45 mL Per Tube TID   fiber  1 packet Per Tube BID   free water  300 mL Per Tube Q4H   furosemide  40 mg Intravenous BID   hydrALAZINE  25 mg Per Tube Q8H   hydrochlorothiazide  25 mg Oral Daily   insulin aspart  10 Units Subcutaneous Q4H   insulin aspart  2-6 Units Subcutaneous Q4H   insulin detemir  38 Units Subcutaneous Q12H   lactulose  20 g Per Tube BID   levothyroxine  50 mcg Per Tube Q0600   mouth rinse  15 mL Mouth Rinse 10 times per day   methylPREDNISolone (SOLU-MEDROL) injection  60 mg  Intravenous Q12H   metoprolol tartrate  50 mg Per Tube TID PC   oxyCODONE  15 mg Per Tube Q6H   pantoprazole sodium  40 mg Per Tube BID   pravastatin  40 mg Per Tube q1800   QUEtiapine  100 mg Per Tube BID   sodium chloride flush  10-40 mL Intracatheter Q12H    Infusions:  sodium chloride 250 mL (09/20/20 0405)   sodium chloride 10 mL/hr at 09/20/20 0600   albumin human     albumin human Stopped (09/19/20 1804)   bivalirudin (ANGIOMAX) infusion 0.5 mg/mL (Non-ACS indications) 0.12 mg/kg/hr (09/19/20 1800)   citrate dextrose     dextrose     feeding supplement (PIVOT 1.5 CAL) 1,000 mL (09/19/20 2038)   fentaNYL infusion INTRAVENOUS 325 mcg/hr (09/20/20 0402)   midazolam 8 mg/hr (09/20/20 0618)   propofol (DIPRIVAN) infusion 10 mcg/kg/min (09/20/20 0411)    PRN Medications: sodium chloride, acetaminophen (TYLENOL) oral liquid 160 mg/5 mL, acetaminophen, albumin human, albuterol, dextrose, diphenhydrAMINE, diphenhydrAMINE, fentaNYL, Gerhardt's butt cream, heparin, labetalol, lip balm, LORazepam, ondansetron **OR** ondansetron (ZOFRAN) IV, polyethylene glycol, sodium chloride flush   Assessment/Plan:   1. Acute hypoxic respiratory failure/ARDS in setting of COVID-19 PNA - Cannulated for VV ECMO on 12/8 after failing intubation x 2 days - has complete remedisivir, baricitinib, Solumedrol.  - s/p trach 12/16 - continues to diurese briskly but still about 20 pounds up from baseline. Creatinine stable. Continue to diurese.  - CXR stable to slightly worse this am.  Sweep down to 8 Oxygenation improved. Continue to wean sweep as tolerated - evidence of worsening acidosis/infection 12/17 mero added. Mero completed 12/21 - staph in tracheal aspirate -  vanc added 12/14 mero added 12/17. vanc complete 12/20 - circuit changed 12/13 for elevated LDH and lower flows. PLEX started 12/15. S/p PLEX #4 on 12/21 Saturday. Will get 5th run tomorrow. LDH down - PTT goal turned down to  50-60 due to bleeding. Improved. Circuit looks ok. PTT 59 today. Bleeding has resolved. Discussed dosing with PharmD personally. - Driving pressure ok - as above. volume status improving. Continue IV diuresis. BUN/Cr stable.   2. Bilateral DVT - remains on bival - Dosing d/w PharmD - no change  3. DM2 - Insulin adjusted. CBGs improved  4. Obesity -Body mass index is 33.73 kg/m.  5. F/E/N - Stable 149 - Continue free water dose adjusted  6. HTN - Remains very labile  - Meds adjusted. May have to tolerate some permissive hypertension (especially during periods of agitation) to avoid hypotension. Hold anti-HTN meds prior to PLEX - no change  7. Dusky R thumb - improved  8. AKI - diuresing briskly. BUN/CR stable - can use PLEX line for HD if needed  9. Acute encephalopathy - remains agitated  - head CT ok 09/07/20  Plan discussed on multi-discplinary ECMO rounds with CCM, ECMO coordinator/specialist, PharmDs and RNs.  CRITICAL CARE Performed by: Glori Bickers  CRITICAL CARE Performed by: Glori Bickers  Total critical care time: 35 minutes  Critical care time was exclusive of separately billable procedures and treating other patients.  Critical care was necessary to treat or prevent imminent or life-threatening deterioration.  Critical care was time spent personally by me (independent of midlevel providers or residents) on the following activities: development of treatment plan with patient and/or surrogate as  well as nursing, discussions with consultants, evaluation of patient's response to treatment, examination of patient, obtaining history from patient or surrogate, ordering and performing treatments and interventions, ordering and review of laboratory studies, ordering and review of radiographic studies, pulse oximetry and re-evaluation of patient's condition.    Length of Stay: 7   Glori Bickers MD 09/20/2020, 7:58 AM  Advanced Heart Failure  Team Pager [phone] (M-F; [place])  Please contact [facility] for night-coverage after hours (4p -7a ) and weekends on [email or web address]

## 2020-09-20 NOTE — Progress Notes (Signed)
Palliative:  Mr. Buckles goes by "[name]" not Juniorand not Crystian (he hates these names per wife).  HPI:59 y.o.malewith a pertinent history ofhypothyroidism on levothyroxine and hyperlipidemia who was diagnosed with Covid 1 week ago and is not vaccinated. With EMS he was seen to bedesaturatingto 78% on room air. He states he started having symptoms on Friday and tested positive on Saturday.Had initially elected to be DNAR without intubation though later changed this and got intubated on 12/6 in the setting of worsening respiratory failure with hypoxia. CTA chest w/diffuse bilateral infiltrates.On 12/8 was cannulated for VV ECMO.Had been extubated on 12/9 then emergently reintubated in early eveningdue to clinical instability. Palliative care was asked to get involved in the setting of VV ECMO to set goals and expectations withJ.R.'sfamily.He continues on VV ECMO and ventilator support.ECMO circuit change 09/11/20.PLEX began 12/15.   [name] continues to be sedated on vent and with ECMO support. Able to decrease sweep with success and still weaning. Diuresing well. PXCR with some improvement. Plan for PLEX #5/5 for tomorrow. Still unable to wean sedation due to agitation. Antibiotics completed.   I called and spoke with wife, [name]. Provided update. He continues to be very ill but without further decline. We are seeing improvement in [facility] and weaning down ECMO support although we still have a long way to go. Provided [name] with above updates. Family are pleased and comforted by being able to visit with Donald Rowe is pleased with his progress and plans to visit later today when she is able to have childcare available.   All questions/concerns addressed. Emotional support provided.   Exam: Sedated on vent via trach. VV ECMO. Cortrak in place.No distress.40% FiO2 and tolerating ventwith no dyssynchrony.Abd flat.  Plan:  - Continue aggressive care with hopes of eventual improvement.    25 min  Yong Channel, NP Palliative Medicine Team Pager [phone] (Please see [email or web address] for schedule) Team Phone [phone]    Greater than 50%  of this time was spent counseling and coordinating care related to the above assessment and plan

## 2020-09-20 NOTE — Progress Notes (Signed)
ANTICOAGULATION CONSULT NOTE  Pharmacy Consult for bivalirudin Indication: ECMO + bilateral DVTs   Recent Labs    09/18/20 0312 09/18/20 0434 09/19/20 0412 09/19/20 0418 09/19/20 1533 09/19/20 1539 09/19/20 1619 09/19/20 1935 09/20/20 0322 09/20/20 0828 09/20/20 1550 09/20/20 1554 09/20/20 1713  HGB  --    < > 9.5*   < > 9.0*   < >  --    < > 8.7*  8.2*   < > 10.3* 10.2* 9.2*  HCT  --    < > 28.5*   < > 28.5*   < >  --    < > 26.0*  24.0*   < > 31.2* 30.0* 27.0*  PLT  --    < > 155  --  139*  --   --   --  146*  --  150  --   --   APTT 51*   < > 49*  --  88*  --  87*  --  59*  --  53*  --   --   LABPROT 26.1*  --  21.8*  --   --   --   --   --  28.2*  --   --   --   --   INR 2.5*  --  2.0*  --   --   --   --   --  2.8*  --   --   --   --   CREATININE 1.35*   < > 1.34*  --  1.28*  --   --   --  1.28*  --  1.21  --   --    < > = values in this interval not displayed.    Estimated Creatinine Clearance: 82.1 mL/min (by C-G formula based on SCr of 1.21 mg/dL).   Assessment: 43 yom unvaccinated presenting with severe COVID ARDs, intubated on 12/6, continued to have ventilation issues despite NMB and proning - started on VV ECMO on 12/8.  D-dimer >20 > now improved 14, found to have bilateral DVTs.  Given significant bleeding issues from trach, targeting lower aPTT goal of 50-55 seconds after discussion with ECMO team; but bleeding now improving, so changed goal to 50-60 seconds.   PM aPTT therapeutic at 53 sec. No overt bleeding or complications reported.  Goal of Therapy:  aPTT 50-60 seconds Monitor platelets by anticoagulation protocol: Yes   Plan:  Continue IV bivalirudin at 0.12 mg/kg/hr. Check q12h aPTT Follow bleeding, CBC  Launi Asencio D. Laney Potash, PharmD, BCPS, BCCCP 09/20/2020, 6:32 PM

## 2020-09-20 NOTE — Progress Notes (Signed)
Pt became hypotensive, so all sedation ws turned off  to see if he would wake up. There was no response, so patient was given narcan which had the desired response.

## 2020-09-20 NOTE — Progress Notes (Signed)
Inpatient Diabetes Program Recommendations  AACE/ADA: New Consensus Statement on Inpatient Glycemic Control (2015)  Target Ranges:  Prepandial:   less than 140 mg/dL      Peak postprandial:   less than 180 mg/dL (1-2 hours)      Critically ill patients:  140 - 180 mg/dL   Lab Results  Component Value Date   GLUCAP 94 09/20/2020   HGBA1C 6.0 (H) 09/25/2020    Review of Glycemic Control Results for Donald Rowe, Donald Rowe (MRN [id number]) as of 09/20/2020 12:58  Ref. Range 09/20/2020 08:25 09/20/2020 11:31 09/20/2020 11:53 09/20/2020 12:52  Glucose-Capillary Latest Ref Range: 70 - 99 mg/dL 672 (H) 63 (L) 69 (L) 94   Current orders for Inpatient glycemic control:  Solumedrol decreased to 40 mg Q12h from 60 mg Q12H Pivot 1.5CAL @ 68ml/hr Novolog 10 units Q4H Novolog 2-6 units Q6H Levemir 38 units Q12H   Inpatient Diabetes Program Recommendations:     Noted steroids decreased today.  CBG's trending on low end.  Might consider decreasing tube feed coverage to Novolog 5 units Q4H  Will continue to follow while inpatient.  Thank you, Dulce Sellar, RN, BSN Diabetes Coordinator Inpatient Diabetes Program [phone] (team pager from 8a-5p)

## 2020-09-20 NOTE — Progress Notes (Signed)
PO meds  Clonidine, seroquel, oxycodone, clonazepam, hydralazine given at 2117. Profound drop in blood pressure to maps of 43 around 2137. Levophed restarted to stabilize blood pressure. Will continue to monitor

## 2020-09-20 NOTE — Progress Notes (Signed)
ANTICOAGULATION CONSULT NOTE  Pharmacy Consult for bivalirudin Indication: ECMO + bilateral DVTs   Recent Labs    09/18/20 0312 09/18/20 0434 09/19/20 0412 09/19/20 0418 09/19/20 1533 09/19/20 1539 09/19/20 1619 09/19/20 1935 09/20/20 0322  HGB  --    < > 9.5*   < > 9.0* 9.2*  --  8.2* 8.7*  8.2*  HCT  --    < > 28.5*   < > 28.5* 27.0*  --  24.0* 26.0*  24.0*  PLT  --    < > 155  --  139*  --   --   --  146*  APTT 51*   < > 49*  --  88*  --  87*  --  59*  LABPROT 26.1*  --  21.8*  --   --   --   --   --  28.2*  INR 2.5*  --  2.0*  --   --   --   --   --  2.8*  CREATININE 1.35*   < > 1.34*  --  1.28*  --   --   --  1.28*   < > = values in this interval not displayed.    Estimated Creatinine Clearance: 77.6 mL/min (A) (by C-G formula based on SCr of 1.28 mg/dL (H)).   Assessment: 48 yom unvaccinated presenting with severe COVID ARDs, intubated on 12/6, continued to have ventilation issues despite NMB and proning - started on VV ECMO on 12/8.  D-dimer >20 > now improved 14, found to have bilateral DVTs.  Given significant bleeding issues from trach, targeting lower aPTT goal of 50-55 seconds after discussion with ECMO team; but bleeding now improving, so changed goal to 50-60 seconds.   aPTT this AM is 59.  No overt bleeding or complications noted.   Goal of Therapy:  aPTT 50-60 seconds Monitor platelets by anticoagulation protocol: Yes   Plan:  Continue IV bivalirudin to 0.12 mg/kg/hr. Check q12h aPTT Follow bleeding, CBC  Reece Leader, Colon Flattery, [facility] Clinical Pharmacist  09/20/2020 10:41 AM   [facility] pharmacy phone numbers are listed on [email or web address]

## 2020-09-20 NOTE — Procedures (Signed)
Extracorporeal support note     ECLS cannulation: 09/24/20 Last circuit change: 09/11/2020 Indication: COVID ARDS   Configuration: VV, RIJ 32Fr [place]   Pump speed: 4.6 Pump flow: 3800 Pump used: Cardiohelp   Sweep gas: 100%, 8LPM   Circuit check: 1 small clot in corner Anticoagulant: bivalirudin Anticoagulation targets: PTT 50-60    Changes in support:  -sweep to 8, cont wean on flows as oxygenation allows.  -completed course of abx -sodium remained stable despite 4L diuresis, cont hctz and free water per nephrology.  -cont diuresis, bun and cr stable -holding anti-htn prior to plex tomorrow as no hypotension with plex yesterday -Fibrinogen improved to 121 -LDH down again -cont steroid wean as tolerated Cont BID ppi   Anticipated goals/duration of support: bridge to recovery   Discussed care with ECMO team, Palliative Care, RN, pharmacy.  Briant Sites, DO 09/20/20 8:05 AM [facility]

## 2020-09-20 NOTE — Progress Notes (Signed)
Patient ID: Donald Rowe, male   DOB: 08-06-1961, 59 y.o.   MRN: [id number] S: Diuresed 8 liters overnight.   O:BP (!) 158/56   Pulse (!) 108   Temp 98.6 F (37 C)   Resp (!) 25   Ht $R'5\' 9"'Aj$  (1.753 m)   Wt 114.6 kg   SpO2 96%   BMI 37.31 kg/m   Intake/Output Summary (Last 24 hours) at 09/20/2020 0909 Last data filed at 09/20/2020 0845 Gross per 24 hour  Intake 5665.41 ml  Output 10100 ml  Net -4434.59 ml   Intake/Output: I/O last 3 completed shifts: In: 8536.8 [I.V.:3116.9; NG/GT:5120; IV XQJJHERDE:081] Out: 44818 [HUDJS:97026; VZCHY:8502]  Intake/Output this shift:  Total I/O In: 291.5 [I.V.:101.5; NG/GT:190] Out: 800 [Urine:500; Stool:300] Weight change: 0.1 kg Gen: on vent via trach in seated position CVS: tachy at 108 Resp: distant BS bilaterally Abd: obese, +BS, soft Ext: 1+ anasarca  Recent Labs  Lab 09/14/20 0352 09/14/20 0401 09/15/20 0312 09/15/20 0326 09/16/20 0357 09/16/20 0405 09/17/20 0454 09/17/20 0503 09/17/20 1722 09/17/20 1724 09/18/20 0312 09/18/20 0434 09/18/20 1619 09/18/20 1631 09/19/20 0412 09/19/20 0418 09/19/20 0924 09/19/20 1014 09/19/20 1204 09/19/20 1533 09/19/20 1539 09/19/20 1935 09/20/20 0322  NA 146*   < > 149*   < > 148*   < > 148*   < > 150*   < > 148*   < > 150*   < > 150*   < > 150* 149* 152* 149* 151* 152* 149*  153*  K 4.9   < > 4.8   < > 5.0   < > 4.9   < > 5.1   < > 5.2*   < > 4.5   < > 4.9   < > 4.8 4.6 4.7 5.0 4.9 4.6 4.9  4.9  CL 113*   < > 117*   < > 118*   < > 121*  --  121*  --  119*  --  119*  --  118*  --   --   --   --  118*  --   --  117*  CO2 23   < > 23   < > 22   < > 21*  --  22  --  23  --  22  --  25  --   --   --   --  23  --   --  25  GLUCOSE 205*   < > 215*   < > 215*   < > 180*  --  204*  --  190*  --  128*  --  160*  --   --   --   --  166*  --   --  141*  BUN 73*   < > 82*   < > 84*   < > 87*  --  92*  --  95*  --  92*  --  93*  --   --   --   --  87*  --   --  94*  CREATININE 1.46*   < >  1.55*   < > 1.35*   < > 1.30*  --  1.37*  --  1.35*  --  1.36*  --  1.34*  --   --   --   --  1.28*  --   --  1.28*  ALBUMIN 3.1*  --  2.7*  --  3.0*  --  3.3*  --   --   --  2.8*  --   --   --  2.8*  --   --   --   --   --   --   --  3.5  CALCIUM 7.9*   < > 8.0*   < > 7.8*   < > 7.7*  --  7.7*  --  7.8*  --  8.1*  --  8.4*  --   --   --   --  7.9*  --   --  8.2*  AST 38  --  44*  --  31  --  40  --   --   --  39  --   --   --  133*  --   --   --   --   --   --   --  58*  ALT 27  --  37  --  27  --  39  --   --   --  50*  --   --   --  159*  --   --   --   --   --   --   --  77*   < > = values in this interval not displayed.   Liver Function Tests: Recent Labs  Lab 09/18/20 0312 09/19/20 0412 09/20/20 0322  AST 39 133* 58*  ALT 50* 159* 77*  ALKPHOS 87 110 66  BILITOT 2.7* 5.0* 2.9*  PROT 3.9* 4.2* 4.3*  ALBUMIN 2.8* 2.8* 3.5   No results for input(s): LIPASE, AMYLASE in the last 168 hours. Recent Labs  Lab 09/14/20 0821  AMMONIA 48*   CBC: Recent Labs  Lab 09/18/20 0436 09/18/20 1619 09/18/20 1631 09/19/20 0412 09/19/20 0418 09/19/20 1533 09/19/20 1539 09/19/20 1935 09/20/20 0322  WBC 12.6* 13.5*  --  16.6*  --  15.8*  --   --  17.9*  HGB 8.8* 9.0*   < > 9.5*   < > 9.0* 9.2* 8.2* 8.7*  8.2*  HCT 27.5* 28.0*   < > 28.5*   < > 28.5* 27.0* 24.0* 26.0*  24.0*  MCV 96.2 94.9  --  95.6  --  97.9  --   --  97.4  PLT 122* 135*  --  155  --  139*  --   --  146*   < > = values in this interval not displayed.   Cardiac Enzymes: No results for input(s): CKTOTAL, CKMB, CKMBINDEX, TROPONINI in the last 168 hours. CBG: Recent Labs  Lab 09/19/20 1201 09/19/20 1530 09/19/20 1933 09/19/20 2333 09/20/20 0319  GLUCAP 143* 155* 105* 110* 132*    Iron Studies: No results for input(s): IRON, TIBC, TRANSFERRIN, FERRITIN in the last 72 hours. Studies/Results: DG CHEST PORT 1 VIEW  Result Date: 09/20/2020 CLINICAL DATA:  Intubation.  COVID-19. EXAM: PORTABLE CHEST 1 VIEW  COMPARISON:  Yesterday FINDINGS: New lucency around the mediastinum on both sides. No soft tissue emphysema or pneumothorax. Confluent airspace disease on both sides. Stable positioning of PICC, ECMO catheter, feeding tube, and tracheostomy tube. Stable borderline heart size. IMPRESSION: 1. New pneumomediastinum.  No pneumothorax. 2. Stable hardware positioning and confluent pulmonary opacification. Electronically Signed   By: Monte Fantasia M.D.   On: 09/20/2020 08:14   DG CHEST PORT 1 VIEW  Result Date: 09/19/2020 CLINICAL DATA:  ECMO.  COVID. EXAM: PORTABLE CHEST 1 VIEW COMPARISON:  09/18/2020. FINDINGS: Interim removal of left IJ line. Tracheostomy tube, feeding tube, right PICC line, and ECMO device in  stable position. Heart size stable. Diffuse severe bilateral pulmonary infiltrates are again noted without interim change. Bilateral layering pleural effusions again cannot be excluded. No pneumothorax. IMPRESSION: 1. Interim removal of left IJ line. Remaining lines and tubes including ECMO device in stable position. 2. Diffuse severe bilateral pulmonary infiltrates are again noted without interim change. Bilateral layering pleural effusions again cannot be excluded. Electronically Signed   By: Marcello Moores  Register   On: 09/19/2020 06:44   . amLODipine  10 mg Per Tube Daily  . aspirin  81 mg Per Tube Daily  . calcium gluconate  2 g Intravenous Once  . chlorhexidine gluconate (MEDLINE KIT)  15 mL Mouth Rinse BID  . Chlorhexidine Gluconate Cloth  6 each Topical Daily  . clonazePAM  2 mg Per Tube BID  . cloNIDine  0.2 mg Per Tube TID  . docusate  100 mg Per Tube BID  . feeding supplement (PROSource TF)  45 mL Per Tube TID  . fiber  1 packet Per Tube BID  . free water  300 mL Per Tube Q4H  . furosemide  40 mg Intravenous BID  . hydrALAZINE  25 mg Per Tube Q8H  . hydrochlorothiazide  25 mg Oral Daily  . insulin aspart  10 Units Subcutaneous Q4H  . insulin aspart  2-6 Units Subcutaneous Q4H  .  insulin detemir  38 Units Subcutaneous Q12H  . lactulose  20 g Per Tube BID  . levothyroxine  50 mcg Per Tube Q0600  . mouth rinse  15 mL Mouth Rinse 10 times per day  . methylPREDNISolone (SOLU-MEDROL) injection  40 mg Intravenous Q12H  . metoprolol tartrate  50 mg Per Tube TID PC  . oxyCODONE  15 mg Per Tube Q6H  . pantoprazole sodium  40 mg Per Tube BID  . pravastatin  40 mg Per Tube q1800  . QUEtiapine  100 mg Per Tube BID  . sodium chloride flush  10-40 mL Intracatheter Q12H    BMET    Component Value Date/Time   NA 149 (H) 09/20/2020 0322   NA 153 (H) 09/20/2020 0322   K 4.9 09/20/2020 0322   K 4.9 09/20/2020 0322   CL 117 (H) 09/20/2020 0322   CO2 25 09/20/2020 0322   GLUCOSE 141 (H) 09/20/2020 0322   BUN 94 (H) 09/20/2020 0322   CREATININE 1.28 (H) 09/20/2020 0322   CALCIUM 8.2 (L) 09/20/2020 0322   GFRNONAA >60 09/20/2020 0322   CBC    Component Value Date/Time   WBC 17.9 (H) 09/20/2020 0322   RBC 2.67 (L) 09/20/2020 0322   HGB 8.7 (L) 09/20/2020 0322   HGB 8.2 (L) 09/20/2020 0322   HCT 26.0 (L) 09/20/2020 0322   HCT 24.0 (L) 09/20/2020 0322   PLT 146 (L) 09/20/2020 0322   MCV 97.4 09/20/2020 0322   MCH 32.6 09/20/2020 0322   MCHC 33.5 09/20/2020 0322   RDW 19.1 (H) 09/20/2020 0322   LYMPHSABS 0.4 (L) 09/07/2020 0438   MONOABS 0.5 09/07/2020 0438   EOSABS 0.0 09/07/2020 0438   BASOSABS 0.0 09/07/2020 0438    Assessment/Plan:  1. AKI- presumably due to ischemic ATN associted with covid-19 infection. Scr stable at 1.3 and remains non-oliguric. Azotemia due to corticosteroids, tube feeds, diuretics, and hemolysis 2. 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 Continue to follow LDH and H/H. 1. Had hypotension during plasmapheresis on 09/16/20 requiring neo-synephrine.  2. Right femoral HD catheter  placed 09/13/20 by PCCM. 3. For PLEX treatment #5/5 tomorrow.   4. LDH down to 419 after 4th PLEX (peak of 1569 on 09/11/20). 3. Acute hypoxic respiratory failure- secondary to covid-19 infection on VV ECMO  1. S/p trach 09/14/20 4. Hypernatremia- improved with increased free water volume and follow.  5. 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 HCAP/MRSA pneumonitis/tracheitis- on IV vancomycin.   Donetta Potts, MD [employer] [phone]

## 2020-09-20 NOTE — Progress Notes (Addendum)
NAME:  Donald Rowe, MRN:  [id number], DOB:  09/17/1961, LOS: 75 ADMISSION DATE:  09/09/2020, CONSULTATION DATE:  12/6 REFERRING MD:  Dr. Aileen Fass, CHIEF COMPLAINT:  SOB    Brief History   59 y/o M admitted 12/4 with 1 week hx of SOB, known COVID positive.  He is unvaccinated.  CTA chest negative for PE but demonstrated diffuse bilateral infiltrates.   History of present illness   59 y/o unvaccinated male, admitted 12/4 with a one week history of shortness of breath.    The patient began feeling poorly the day before Thanksgiving with fever, weakness and body aches. He tested positive for COVID on 12/27 prior to presentation.  He developed progressive shortness of breath.  In the ER, the patient was found to have saturations of 78%.  He was started on 6L O2 with initial improvement in saturations.  The patient was admitted per [facility]. CTA of the Chest was assessed and negative for pulmonary embolism but showed diffuse bilateral infiltrates. He was treated with IV remdesivir, steroids and baricitinib.  Initially, he was also treated with antibiotics but these were stopped on 12/6 with a low procalcitonin.  LE doppler negative for DVT. The patient elected to be DNR / no intubation.  He developed worsening respiratory failure with hypoxia requiring 60L flow 100% and PCCM consulted 12/6 for evaluation.   Past Medical History  Hypothyroidism  HLD   Significant Hospital Events/Procedures  12/04 Admit  12/06 PCCM consulted  12/07 Intubated, central line, a line 12/08 VV ECMO Cannulaton, cortrak 12/13 circuit change for hemolytic anemia 12/16 Tracheostomy  12/17 bronch w/ BAL  Consults:  PCCM, Heart failure, TCTS, ECMO  Significant Diagnostic Tests:   CTA Chest 12/4 >> extensive bilateral airspace disease, no large PE identified, limited study   LE Venous Duplex 12/4 >> negative for DVT bilaterally   LE venous duplex 12/8> BLE DVTs  Micro Data:  COVID 12/4 >> negative  Influenza A/B  12/4 >> negative  MRSA PCR 12/5 >> negative  BCx2 12/4 >> NG Trach aspirate 12/12> rare MRSA BAL 12/17> staph aureus Aspergillus Ag BAL 12/17>  Antimicrobials:  Azithromycin 12/5 >> 12/6  Ceftriaxone 12/5 >> 12/6  Cefepime 12/7>12/14 vanc 12/14>12/20 Meropenem 12/17>12/21  Interim history/subjective:  O/N event: Additional 8.5L diuresis. PLEX performed yesterday. Sweep wean down to 8  Donald Rowe was examined and evaluated at bedside this am. Appear to be more alert this morning. Noted to be coughing  Objective   Blood pressure 134/65, pulse (!) 110, temperature 98.42 F (36.9 C), resp. rate 13, height _0  (1.753 m), weight 114.6 kg, SpO2 98 %.    Vent Mode: PCV FiO2 (%):  [50 %-60 %] 50 % Set Rate:  [12 bmp] 12 bmp PEEP:  [10 cmH20] 10 cmH20 Plateau Pressure:  [15 cmH20-27 cmH20] 27 cmH20   Intake/Output Summary (Last 24 hours) at 09/20/2020 0706 Last data filed at 09/20/2020 0093 Gross per 24 hour  Intake 5750.18 ml  Output 9300 ml  Net -3549.82 ml   Filed Weights   09/19/20 0600 09/19/20 0954 09/20/20 0424  Weight: 118.9 kg 119 kg 114.6 kg    Examination: Gen: Well-developed, ill-appearing HEENT: NCAT head Neck: supple, ROM intact, ECMO lines intact and functioning CV: Tachycardic, regular rhythm Pulm: Distant breath sounds, no obvious wheeze or rales Abd: Soft, BS+, NTND Extm: Peripheral pulses intact, trace ankle pitting edema Skin: Dry, Warm, small areas of ecchymois throughout Neuro: RASS-2  Resolved Hospital Problem list  Assessment & Plan:   Acute hypoxemic and hypercarbic respiratory failure secondary to COVID PNA with severe ARDS. S/p VV ECMO cannulation 09/17/2020.  Tracheostomy 20/10 complicated by trach site bleeding HAP Completed remdesivir.  Completed baricitinib.  Completed CAP coverage. Vanc d/c after 7 day course. Meropenem end yesterday after 5 day course. Afebrile overnight.  Wbc up-trending 13.6->16.6->17.9 but on steroids -  Appreciate heart failure recs -c/w full ECMO support; bival for AC -c/w ultralung protective ventilation; goal FiO2 50% or less -C/w solumedrol 60 BID -routine trach care -VAP prevention protocol -c/w plex completed 4/5. Next plex planned for later this week -C/w furosemide to 63m BID, hydralazine 273mdaily; strict I/Os  Agitated delirium requiring titration of IV sedation Well sedated this am. Although appearing to become more alert -PAD protocol; goal RASS -1 and tolerance of MV & ECMO -con't seroquel -con't enteral oxycodone and clonazepam  Bilateral lower ext DVTs- provoked by COVID infection -on bivaliriduin; goal PTT 50-55 currently  Steroid-induced hyperglycemia Am cbg 160 -C/w levemir to 38 units BID -con't TF coverage and SSI PRN -goal BG 140-180  Hypothyroidism -con't PTA synthroid   HLD -con't statin  Hemolytic Anemia Throbocytopenia Hyperbilirubinemia Rheumatologic-associated MAHA remains in differential.  Hgb 9.5->8.7, platelet count 155->146 -Con't PLEX (last tx 4/5 yesterday). Appreciate Nephrology's assistance -Transfuse if hgb <8  Hypervolemia Hypernatremia and hyperchloremia Azotemia Sodium improving with HCTZ down to 149. Weight down to 114.6kg from 122.6kg -lasix 4076mID, hctz 103m59mily -strict I/Os -Free water 200cc q4hr  Plan discussed with Palliative Care, Cardiology, ECMO specialist, RN, pharmacy at beside.    Best practice (evaluated daily)  Diet: TF Pain/Anxiety/Delirium protocol (if indicated): as above VAP protocol (if indicated): yes DVT prophylaxis: Bivalirudin GI prophylaxis: pantoprazole  Glucose control: basal-bolus Mobility: bedrest Last date of multidisciplinary goals of care discussion: wife updated over the phone 12/15 Family and staff present RN, physicain Summary of discussion con't aggressive care Follow up goals of care discussion due: 12/22 Code Status: full Disposition: ICU  Donald Rowe,Donald Rowe 09/20/2020,  7:06 AM Pager: [phone], [facility]

## 2020-09-21 ENCOUNTER — Inpatient Hospital Stay (HOSPITAL_COMMUNITY): Payer: HRSA Program

## 2020-09-21 DIAGNOSIS — U071 COVID-19: Secondary | ICD-10-CM | POA: Diagnosis not present

## 2020-09-21 DIAGNOSIS — M7989 Other specified soft tissue disorders: Secondary | ICD-10-CM

## 2020-09-21 DIAGNOSIS — Z515 Encounter for palliative care: Secondary | ICD-10-CM | POA: Diagnosis not present

## 2020-09-21 DIAGNOSIS — J9601 Acute respiratory failure with hypoxia: Secondary | ICD-10-CM | POA: Diagnosis not present

## 2020-09-21 DIAGNOSIS — Z7189 Other specified counseling: Secondary | ICD-10-CM | POA: Diagnosis not present

## 2020-09-21 DIAGNOSIS — J1282 Pneumonia due to coronavirus disease 2019: Secondary | ICD-10-CM | POA: Diagnosis not present

## 2020-09-21 LAB — CBC
HCT: 29.7 % — ABNORMAL LOW (ref 39.0–52.0)
HCT: 26.5 % — ABNORMAL LOW (ref 39.0–52.0)
Hemoglobin: 9.8 g/dL — ABNORMAL LOW (ref 13.0–17.0)
Hemoglobin: 8.2 g/dL — ABNORMAL LOW (ref 13.0–17.0)
MCH: 31.4 pg (ref 26.0–34.0)
MCH: 29.7 pg (ref 26.0–34.0)
MCHC: 33 g/dL (ref 30.0–36.0)
MCHC: 30.9 g/dL (ref 30.0–36.0)
MCV: 95.2 fL (ref 80.0–100.0)
MCV: 96 fL (ref 80.0–100.0)
Platelets: 144 10*3/uL — ABNORMAL LOW (ref 150–400)
Platelets: 141 10*3/uL — ABNORMAL LOW (ref 150–400)
RBC: 3.12 MIL/uL — ABNORMAL LOW (ref 4.22–5.81)
RBC: 2.76 MIL/uL — ABNORMAL LOW (ref 4.22–5.81)
RDW: 21.2 % — ABNORMAL HIGH (ref 11.5–15.5)
RDW: 20.8 % — ABNORMAL HIGH (ref 11.5–15.5)
WBC: 17.3 10*3/uL — ABNORMAL HIGH (ref 4.0–10.5)
WBC: 13 10*3/uL — ABNORMAL HIGH (ref 4.0–10.5)
nRBC: 0.2 % (ref 0.0–0.2)
nRBC: 0 % (ref 0.0–0.2)

## 2020-09-21 LAB — BASIC METABOLIC PANEL
Anion gap: 7 (ref 5–15)
Anion gap: 5 (ref 5–15)
BUN: 95 mg/dL — ABNORMAL HIGH (ref 6–20)
BUN: 98 mg/dL — ABNORMAL HIGH (ref 6–20)
CO2: 28 mmol/L (ref 22–32)
CO2: 30 mmol/L (ref 22–32)
Calcium: 8.4 mg/dL — ABNORMAL LOW (ref 8.9–10.3)
Calcium: 8 mg/dL — ABNORMAL LOW (ref 8.9–10.3)
Chloride: 115 mmol/L — ABNORMAL HIGH (ref 98–111)
Chloride: 115 mmol/L — ABNORMAL HIGH (ref 98–111)
Creatinine, Ser: 1.27 mg/dL — ABNORMAL HIGH (ref 0.61–1.24)
Creatinine, Ser: 1.18 mg/dL (ref 0.61–1.24)
GFR, Estimated: >60 mL/min (ref >60)
GFR, Estimated: >60 mL/min (ref >60)
Glucose, Bld: 178 mg/dL — ABNORMAL HIGH (ref 70–99)
Glucose, Bld: 159 mg/dL — ABNORMAL HIGH (ref 70–99)
Potassium: 5.4 mmol/L — ABNORMAL HIGH (ref 3.5–5.1)
Potassium: 5.1 mmol/L (ref 3.5–5.1)
Sodium: 150 mmol/L — ABNORMAL HIGH (ref 135–145)
Sodium: 150 mmol/L — ABNORMAL HIGH (ref 135–145)

## 2020-09-21 LAB — POCT I-STAT 7, (LYTES, BLD GAS, ICA,H+H)
Acid-Base Excess: 4 mmol/L — ABNORMAL HIGH (ref 0.0–2.0)
Acid-Base Excess: 6 mmol/L — ABNORMAL HIGH (ref 0.0–2.0)
Acid-Base Excess: 6 mmol/L — ABNORMAL HIGH (ref 0.0–2.0)
Bicarbonate: 30.8 mmol/L — ABNORMAL HIGH (ref 20.0–28.0)
Bicarbonate: 31.9 mmol/L — ABNORMAL HIGH (ref 20.0–28.0)
Bicarbonate: 31.8 mmol/L — ABNORMAL HIGH (ref 20.0–28.0)
Calcium, Ion: 1.27 mmol/L (ref 1.15–1.40)
Calcium, Ion: 1.27 mmol/L (ref 1.15–1.40)
Calcium, Ion: 1.27 mmol/L (ref 1.15–1.40)
HCT: 27 % — ABNORMAL LOW (ref 39.0–52.0)
HCT: 27 % — ABNORMAL LOW (ref 39.0–52.0)
HCT: 26 % — ABNORMAL LOW (ref 39.0–52.0)
Hemoglobin: 9.2 g/dL — ABNORMAL LOW (ref 13.0–17.0)
Hemoglobin: 9.2 g/dL — ABNORMAL LOW (ref 13.0–17.0)
Hemoglobin: 8.8 g/dL — ABNORMAL LOW (ref 13.0–17.0)
O2 Saturation: 95 %
O2 Saturation: 95 %
O2 Saturation: 96 %
Patient temperature: 37
Patient temperature: 36.6
Patient temperature: 37
Potassium: 5.4 mmol/L — ABNORMAL HIGH (ref 3.5–5.1)
Potassium: 4.6 mmol/L (ref 3.5–5.1)
Potassium: 5 mmol/L (ref 3.5–5.1)
Sodium: 149 mmol/L — ABNORMAL HIGH (ref 135–145)
Sodium: 150 mmol/L — ABNORMAL HIGH (ref 135–145)
Sodium: 149 mmol/L — ABNORMAL HIGH (ref 135–145)
TCO2: 33 mmol/L — ABNORMAL HIGH (ref 22–32)
TCO2: 34 mmol/L — ABNORMAL HIGH (ref 22–32)
TCO2: 33 mmol/L — ABNORMAL HIGH (ref 22–32)
pCO2 arterial: 58.1 mmHg — ABNORMAL HIGH (ref 32.0–48.0)
pCO2 arterial: 53.3 mmHg — ABNORMAL HIGH (ref 32.0–48.0)
pCO2 arterial: 55.7 mmHg — ABNORMAL HIGH (ref 32.0–48.0)
pH, Arterial: 7.333 — ABNORMAL LOW (ref 7.350–7.450)
pH, Arterial: 7.383 (ref 7.350–7.450)
pH, Arterial: 7.365 (ref 7.350–7.450)
pO2, Arterial: 85 mmHg (ref 83.0–108.0)
pO2, Arterial: 77 mmHg — ABNORMAL LOW (ref 83.0–108.0)
pO2, Arterial: 85 mmHg (ref 83.0–108.0)

## 2020-09-21 LAB — FIBRINOGEN: Fibrinogen: 274 mg/dL (ref 210–475)

## 2020-09-21 LAB — GLUCOSE, CAPILLARY
Glucose-Capillary: 94 mg/dL (ref 70–99)
Glucose-Capillary: 158 mg/dL — ABNORMAL HIGH (ref 70–99)
Glucose-Capillary: 111 mg/dL — ABNORMAL HIGH (ref 70–99)
Glucose-Capillary: 102 mg/dL — ABNORMAL HIGH (ref 70–99)
Glucose-Capillary: 100 mg/dL — ABNORMAL HIGH (ref 70–99)
Glucose-Capillary: 122 mg/dL — ABNORMAL HIGH (ref 70–99)

## 2020-09-21 LAB — APTT
aPTT: 52 seconds — ABNORMAL HIGH (ref 24–36)
aPTT: 57 seconds — ABNORMAL HIGH (ref 24–36)

## 2020-09-21 LAB — HEPATIC FUNCTION PANEL
ALT: 68 U/L — ABNORMAL HIGH (ref 0–44)
AST: 51 U/L — ABNORMAL HIGH (ref 15–41)
Albumin: 3.2 g/dL — ABNORMAL LOW (ref 3.5–5.0)
Alkaline Phosphatase: 81 U/L (ref 38–126)
Bilirubin, Direct: 0.8 mg/dL — ABNORMAL HIGH (ref 0.0–0.2)
Indirect Bilirubin: 0.9 mg/dL (ref 0.3–0.9)
Total Bilirubin: 1.7 mg/dL — ABNORMAL HIGH (ref 0.3–1.2)
Total Protein: 4.8 g/dL — ABNORMAL LOW (ref 6.5–8.1)

## 2020-09-21 LAB — LACTIC ACID, PLASMA
Lactic Acid, Venous: 0.8 mmol/L (ref 0.5–1.9)
Lactic Acid, Venous: 0.9 mmol/L (ref 0.5–1.9)

## 2020-09-21 LAB — LACTATE DEHYDROGENASE: LDH: 551 U/L — ABNORMAL HIGH (ref 98–192)

## 2020-09-21 LAB — PROTIME-INR
INR: 1.8 — ABNORMAL HIGH (ref 0.8–1.2)
Prothrombin Time: 20.5 seconds — ABNORMAL HIGH (ref 11.4–15.2)

## 2020-09-21 MED ORDER — INSULIN ASPART 100 UNIT/ML ~~LOC~~ SOLN
5.0000 [IU] | SUBCUTANEOUS | Status: DC
  Administered 2020-09-21 – 2020-09-24 (×19): 5 [IU] via SUBCUTANEOUS

## 2020-09-21 MED ORDER — FUROSEMIDE 10 MG/ML IJ SOLN
40.0000 mg | Freq: Every day | INTRAMUSCULAR | Status: DC
  Administered 2020-09-22 – 2020-09-24 (×3): 40 mg via INTRAVENOUS
  Filled 2020-09-21 (×3): qty 4

## 2020-09-21 MED ORDER — SODIUM ZIRCONIUM CYCLOSILICATE 5 G PO PACK
5.0000 g | PACK | Freq: Two times a day (BID) | ORAL | Status: AC
  Administered 2020-09-21 (×2): 5 g via ORAL
  Filled 2020-09-21 (×2): qty 1

## 2020-09-21 MED ORDER — DEXMEDETOMIDINE HCL IN NACL 400 MCG/100ML IV SOLN
0.0000 ug/kg/h | INTRAVENOUS | Status: DC
  Administered 2020-09-21: 1.2 ug/kg/h via INTRAVENOUS
  Administered 2020-09-21: 17:00:00 0.5 ug/kg/h via INTRAVENOUS
  Administered 2020-09-21: 09:00:00 0.4 ug/kg/h via INTRAVENOUS
  Administered 2020-09-21: 13:00:00 0.8 ug/kg/h via INTRAVENOUS
  Administered 2020-09-21: 21:00:00 1.2 ug/kg/h via INTRAVENOUS
  Administered 2020-09-22: 17:00:00 0.8 ug/kg/h via INTRAVENOUS
  Administered 2020-09-22: 13:00:00 1 ug/kg/h via INTRAVENOUS
  Administered 2020-09-22: 0.8 ug/kg/h via INTRAVENOUS
  Administered 2020-09-22 (×3): 1.2 ug/kg/h via INTRAVENOUS
  Administered 2020-09-23: 01:00:00 0.8 ug/kg/h via INTRAVENOUS
  Administered 2020-09-23: 10:00:00 0.6 ug/kg/h via INTRAVENOUS
  Administered 2020-09-23: 15:00:00 0.8 ug/kg/h via INTRAVENOUS
  Administered 2020-09-23: 19:00:00 0.9 ug/kg/h via INTRAVENOUS
  Administered 2020-09-23 – 2020-09-24 (×2): 1 ug/kg/h via INTRAVENOUS
  Administered 2020-09-24: 06:00:00 0.8 ug/kg/h via INTRAVENOUS
  Administered 2020-09-24: 14:00:00 1 ug/kg/h via INTRAVENOUS
  Administered 2020-09-24: 11:00:00 0.6 ug/kg/h via INTRAVENOUS
  Administered 2020-09-24: 01:00:00 0.7 ug/kg/h via INTRAVENOUS
  Filled 2020-09-21: qty 100
  Filled 2020-09-21: qty 200
  Filled 2020-09-21 (×8): qty 100
  Filled 2020-09-21: qty 200
  Filled 2020-09-21 (×2): qty 100
  Filled 2020-09-21: qty 200
  Filled 2020-09-21 (×2): qty 100

## 2020-09-21 MED ORDER — METHYLPREDNISOLONE SODIUM SUCC 40 MG IJ SOLR
40.0000 mg | INTRAMUSCULAR | Status: DC
  Administered 2020-09-21 – 2020-09-24 (×4): 40 mg via INTRAVENOUS
  Filled 2020-09-21 (×4): qty 1

## 2020-09-21 MED ORDER — INSULIN DETEMIR 100 UNIT/ML ~~LOC~~ SOLN
25.0000 [IU] | Freq: Two times a day (BID) | SUBCUTANEOUS | Status: DC
  Administered 2020-09-21 – 2020-09-24 (×8): 25 [IU] via SUBCUTANEOUS
  Filled 2020-09-21 (×9): qty 0.25

## 2020-09-21 NOTE — Progress Notes (Signed)
Patient tolerated bed tilt at 30 degree angle for 90 minutes.Marland Kitchen

## 2020-09-21 NOTE — Progress Notes (Signed)
OT Cancellation Note  Patient Details Name: Donald Rowe MRN: [id number] DOB: 1961/05/17   Cancelled Treatment:    Reason Eval/Treat Not Completed: Patient in stand with nursing staff for chest x-ray when OT went to check on his status.  OT to continue efforts.    Nathanial Arrighi D Jobeth Pangilinan 09/21/2020, 4:06 PM

## 2020-09-21 NOTE — Procedures (Signed)
Extracorporeal support note     ECLS cannulation: 30-Sep-2020 Last circuit change: 09/11/2020 Indication: COVID ARDS   Configuration: VV, RIJ 32Fr [place]   Pump speed: 4.6 Pump flow: 3800 Pump used: Cardiohelp   Sweep gas: 100%, 4LPM   Circuit check: 1 small clot in corner Anticoagulant: bivalirudin Anticoagulation targets: PTT 50-60    Changes in support:  -sweep to 4, cont wean on flows as oxygenation allows.  -completed course of abx -sodium relatively remained stable despite 8L diuresis, d/c hctz, cont free water per nephrology.  -cont diuresis, bun and cr relatively stable -holding anti-htn prior to plex today as no hypotension with plex yesterday when they were held - will attempt to minimize meds given at same time with large decrease in BP when given as scheduled -Fibrinogen improved to 274 -LDH up.. for plex today -cont steroid wean as tolerated down to daily and then stop after 72h -changing insulin regimen accordingly -Cont BID ppi   Anticipated goals/duration of support: bridge to recovery   Discussed care with ECMO team, Palliative Care, RN, pharmacy.  Briant Sites, DO 09/21/20 8:14 AM [facility]

## 2020-09-21 NOTE — Progress Notes (Signed)
ANTICOAGULATION CONSULT NOTE  Pharmacy Consult for bivalirudin Indication: ECMO + bilateral DVTs   Recent Labs    09/19/20 0412 09/19/20 0418 09/20/20 0322 09/20/20 0828 09/20/20 1550 09/20/20 1554 09/20/20 2308 09/21/20 0252 09/21/20 0411  HGB 9.5*   < > 8.7*   8.2*   < > 10.3*   < > 9.5* 9.2* 9.8*  HCT 28.5*   < > 26.0*   24.0*   < > 31.2*   < > 28.0* 27.0* 29.7*  PLT 155   < > 146*  --  150  --   --   --  144*  APTT 49*   < > 59*  --  53*  --   --   --  52*  LABPROT 21.8*  --  28.2*  --   --   --   --   --  20.5*  INR 2.0*  --  2.8*  --   --   --   --   --  1.8*  CREATININE 1.34*   < > 1.28*  --  1.21  --   --   --  1.27*   < > = values in this interval not displayed.    Estimated Creatinine Clearance: 76.3 mL/min (A) (by C-G formula based on SCr of 1.27 mg/dL (H)).   Assessment: 79 yom unvaccinated presenting with severe COVID ARDs, intubated on 12/6, continued to have ventilation issues despite NMB and proning - started on VV ECMO on 12/8.  D-dimer >20 > now improved 14, found to have bilateral DVTs.  Given significant bleeding issues from trach, targeting lower aPTT goal of 50-55 seconds after discussion with ECMO team; but bleeding now improving, so changed goal to 50-60 seconds.   aPTT this AM is therapeutic at 52.  Hgb 9.8, plt 144. Fibrinogen 274, LDH 551. No s/sx of bleeding or infusion issues. Had 2 units of PRBCs yesterday.  Goal of Therapy:  aPTT 50-60 seconds Monitor platelets by anticoagulation protocol: Yes   Plan:  Continue IV bivalirudin to 0.12 mg/kg/hr. Check q12h aPTT Follow bleeding, CBC  Sherron Monday, PharmD, BCCCP Clinical Pharmacist  Phone: [phone] 09/21/2020 7:20 AM  Please check AMION for all [facility] phone numbers After 10:00 PM, call Main Pharmacy [phone]

## 2020-09-21 NOTE — Progress Notes (Signed)
Nutrition Follow Up  DOCUMENTATION CODES:   Not applicable  INTERVENTION:   Continue tube feeding:  -Pivot 1.5 @ 70 ml/hr via Cortrak (1680 ml) -ProSource TF 45 ml TID -Free water flushes 300 ml Q4 hours   Provides: 2640 kcal, 191 grams protein, 1260 ml free water (3060 ml with flushes).   NUTRITION DIAGNOSIS:   Increased nutrient needs related to acute illness (COVID PNA) as evidenced by estimated needs   Ongoing  GOAL:   Patient will meet greater than or equal to 90% of their needs   Addressed via TF  MONITOR:   Vent status,Skin,Weight trends,Labs,I & O's,TF tolerance  REASON FOR ASSESSMENT:   Ventilator,Consult Enteral/tube feeding initiation and management  ASSESSMENT:   Patient with PMH significant for asthma, cancer, CHF, DM, HTN, sickle cell anemia, renal insufficiency, and hyperlipidemia. Presents this admission with severe COVID ARDS.   12/04- admit [facility] 1206- hypoxia, intubated 12/07- transfer to [facility] for ECMO 12/08- VV ECMO cannulation, post pyloric Cortrak placed 12/09- extubated, re-intubated  12/13- circuit change 12/15- started PLEX 12/16- trach   Pt discussed during ICU rounds and with RN.   Mental status fluctuates. CXR slightly improved. Had episode of hypotension last night, off pressors this am. Last PLEX treatment today. Good UOP with lasix. Remains hypernatremic, free water recently decreased to 300 ml Q4 hours. Tolerating tube feeding at goal. Stool output showing slight decrease.   Admission weight: 101.9 kg  Current weight: 109.1 kg   Patient requiring ventilator support via trach  MV: 14.5 L/min Temp (24hrs), Avg:98.7 F (37.1 C), Min:97.9 F (36.6 C), Max:98.96 F (37.2 C)  UOP: 8175 ml x 24 hrs  Stool: 700 ml x 24 hrs   Drips: precedex Medications: nutrisource fiber BID, 40 mg lasix daily, SS novolog, levemir, solumedrol, lokelma Labs: Na 150 (H) Cr 1.27- stable over last 3 days CBG 94-166  Diet Order:   Diet Order     None      EDUCATION NEEDS:   Not appropriate for education at this time  Skin:  Skin Assessment: Skin Integrity Issues: Skin Integrity Issues:: Other (Comment) Other: skin tear- R ear  Last BM:  12/23  Height:   Ht Readings from Last 1 Encounters:  09/05/20 5\' 9"  (1.753 m)    Weight:   Wt Readings from Last 1 Encounters:  09/21/20 109.1 kg    Adjusted Body Weight:  84.5 kg   BMI:  Body mass index is 35.52 kg/m.  Estimated Nutritional Needs:   Kcal:  09/23/20 kcal  Protein:  155-195 grams  Fluid:  >/= 2 L/day  [phone] RD, LDN Clinical Nutrition Pager listed in AMION

## 2020-09-21 NOTE — Progress Notes (Signed)
I was mistaken when I noted that patient would have plasmapheresis today.  He has completed his course so will not get plasmapheresis today   Donald Rowe

## 2020-09-21 NOTE — Progress Notes (Signed)
Patient ID: Donald Rowe, male   DOB: [birth year], 59 y.o.   MRN: [id number]   S: Diuresed 2 liters overnight on lasix 40 q 12- sodium pretty stable.  K is 5.4  O:BP (!) 195/70   Pulse (!) 121   Temp 98.24 F (36.8 C)   Resp (!) 25   Ht $R'5\' 9"'Kp$  (1.753 m)   Wt 109.1 kg   SpO2 97%   BMI 35.52 kg/m   Intake/Output Summary (Last 24 hours) at 09/21/2020 0752 Last data filed at 09/21/2020 0700 Gross per 24 hour  Intake 6834.83 ml  Output 8875 ml  Net -2040.17 ml   Intake/Output: I/O last 3 completed shifts: In: 9714.3 [I.V.:3144.3; Blood:630; WU/JW:1191] Out: 47829 [FAOZH:08657; QIONG:2952]  Intake/Output this shift:  No intake/output data recorded. Weight change: -9.9 kg Gen: on vent via trach - unresponsive on propofol CVS: tachy at 108 Resp: distant BS bilaterally Abd: obese, +BS, soft femoral temp HD cath Ext: 1-2+ edema  Recent Labs  Lab 09/15/20 0312 09/15/20 0326 09/16/20 0357 09/16/20 0405 09/17/20 0454 09/17/20 0503 09/18/20 0312 09/18/20 0434 09/18/20 1619 09/18/20 1631 09/19/20 0412 09/19/20 0418 09/19/20 1533 09/19/20 1539 09/20/20 0322 09/20/20 0828 09/20/20 1305 09/20/20 1550 09/20/20 1554 09/20/20 1713 09/20/20 2308 09/21/20 0252 09/21/20 0411  NA 149*   < > 148*   < > 148*   < > 148*   < > 150*   < > 150*   < > 149*   < > 149*  153*   < > 151* 149* 150* 150* 151* 149* 150*  K 4.8   < > 5.0   < > 4.9   < > 5.2*   < > 4.5   < > 4.9   < > 5.0   < > 4.9  4.9   < > 4.9 5.2* 5.2* 4.8 4.8 5.4* 5.4*  CL 117*   < > 118*   < > 121*   < > 119*  --  119*  --  118*  --  118*  --  117*  --   --  114*  --   --   --   --  115*  CO2 23   < > 22   < > 21*   < > 23  --  22  --  25  --  23  --  25  --   --  27  --   --   --   --  28  GLUCOSE 215*   < > 215*   < > 180*   < > 190*  --  128*  --  160*  --  166*  --  141*  --   --  151*  --   --   --   --  178*  BUN 82*   < > 84*   < > 87*   < > 95*  --  92*  --  93*  --  87*  --  94*  --   --  91*  --   --   --   --   95*  CREATININE 1.55*   < > 1.35*   < > 1.30*   < > 1.35*  --  1.36*  --  1.34*  --  1.28*  --  1.28*  --   --  1.21  --   --   --   --  1.27*  ALBUMIN 2.7*  --  3.0*  --  3.3*  --  2.8*  --   --   --  2.8*  --   --   --  3.5  --   --   --   --   --   --   --  3.2*  CALCIUM 8.0*   < > 7.8*   < > 7.7*   < > 7.8*  --  8.1*  --  8.4*  --  7.9*  --  8.2*  --   --  8.4*  --   --   --   --  8.4*  AST 44*  --  31  --  40  --  39  --   --   --  133*  --   --   --  58*  --   --   --   --   --   --   --  51*  ALT 37  --  27  --  39  --  50*  --   --   --  159*  --   --   --  77*  --   --   --   --   --   --   --  68*   < > = values in this interval not displayed.   Liver Function Tests: Recent Labs  Lab 09/19/20 0412 09/20/20 0322 09/21/20 0411  AST 133* 58* 51*  ALT 159* 77* 68*  ALKPHOS 110 66 81  BILITOT 5.0* 2.9* 1.7*  PROT 4.2* 4.3* 4.8*  ALBUMIN 2.8* 3.5 3.2*   No results for input(s): LIPASE, AMYLASE in the last 168 hours. Recent Labs  Lab 09/14/20 0821  AMMONIA 48*   CBC: Recent Labs  Lab 09/19/20 0412 09/19/20 0418 09/19/20 1533 09/19/20 1539 09/20/20 0322 09/20/20 0828 09/20/20 1550 09/20/20 1554 09/20/20 2308 09/21/20 0252 09/21/20 0411  WBC 16.6*  --  15.8*  --  17.9*  --  18.7*  --   --   --  17.3*  HGB 9.5*   < > 9.0*   < > 8.7*  8.2*   < > 10.3*   < > 9.5* 9.2* 9.8*  HCT 28.5*   < > 28.5*   < > 26.0*  24.0*   < > 31.2*   < > 28.0* 27.0* 29.7*  MCV 95.6  --  97.9  --  97.4  --  92.3  --   --   --  95.2  PLT 155  --  139*  --  146*  --  150  --   --   --  144*   < > = values in this interval not displayed.   Cardiac Enzymes: No results for input(s): CKTOTAL, CKMB, CKMBINDEX, TROPONINI in the last 168 hours. CBG: Recent Labs  Lab 09/20/20 1252 09/20/20 1551 09/20/20 1955 09/20/20 2306 09/21/20 0415  GLUCAP 94 152* 107* 94 158*    Iron Studies: No results for input(s): IRON, TIBC, TRANSFERRIN, FERRITIN in the last 72 hours. Studies/Results: DG CHEST  PORT 1 VIEW  Result Date: 09/20/2020 CLINICAL DATA:  Intubation.  COVID-19. EXAM: PORTABLE CHEST 1 VIEW COMPARISON:  Yesterday FINDINGS: New lucency around the mediastinum on both sides. No soft tissue emphysema or pneumothorax. Confluent airspace disease on both sides. Stable positioning of PICC, ECMO catheter, feeding tube, and tracheostomy tube. Stable borderline heart size. IMPRESSION: 1. New pneumomediastinum.  No pneumothorax. 2. Stable hardware positioning and confluent pulmonary opacification. Electronically Signed   By:  Marnee Spring M.D.   On: 09/20/2020 08:14   . sodium chloride   Intravenous Once  . amLODipine  10 mg Per Tube QHS  . aspirin  81 mg Per Tube Daily  . calcium gluconate  2 g Intravenous Once  . chlorhexidine gluconate (MEDLINE KIT)  15 mL Mouth Rinse BID  . Chlorhexidine Gluconate Cloth  6 each Topical Daily  . clonazePAM  2 mg Per Tube BID  . cloNIDine  0.2 mg Per Tube TID  . docusate  100 mg Per Tube BID  . feeding supplement (PROSource TF)  45 mL Per Tube TID  . fiber  1 packet Per Tube BID  . free water  300 mL Per Tube Q4H  . furosemide  40 mg Intravenous BID  . hydrALAZINE  25 mg Per Tube TID AC  . hydrochlorothiazide  25 mg Oral Daily  . insulin aspart  10 Units Subcutaneous Q4H  . insulin aspart  2-6 Units Subcutaneous Q4H  . insulin detemir  38 Units Subcutaneous Q12H  . lactulose  20 g Per Tube BID  . levothyroxine  50 mcg Per Tube Q0600  . mouth rinse  15 mL Mouth Rinse 10 times per day  . methylPREDNISolone (SOLU-MEDROL) injection  40 mg Intravenous Q12H  . metoprolol tartrate  50 mg Per Tube TID PC  . oxyCODONE  15 mg Per Tube Q6H  . pantoprazole sodium  40 mg Per Tube BID  . pravastatin  40 mg Per Tube q1800  . QUEtiapine  100 mg Per Tube BID  . sodium chloride flush  10-40 mL Intracatheter Q12H    BMET    Component Value Date/Time   NA 150 (H) 09/21/2020 0411   K 5.4 (H) 09/21/2020 0411   CL 115 (H) 09/21/2020 0411   CO2 28 09/21/2020  0411   GLUCOSE 178 (H) 09/21/2020 0411   BUN 95 (H) 09/21/2020 0411   CREATININE 1.27 (H) 09/21/2020 0411   CALCIUM 8.4 (L) 09/21/2020 0411   GFRNONAA >60 09/21/2020 0411   CBC    Component Value Date/Time   WBC 17.3 (H) 09/21/2020 0411   RBC 3.12 (L) 09/21/2020 0411   HGB 9.8 (L) 09/21/2020 0411   HCT 29.7 (L) 09/21/2020 0411   PLT 144 (L) 09/21/2020 0411   MCV 95.2 09/21/2020 0411   MCH 31.4 09/21/2020 0411   MCHC 33.0 09/21/2020 0411   RDW 21.2 (H) 09/21/2020 0411   LYMPHSABS 0.4 (L) 09/07/2020 0438   MONOABS 0.5 09/07/2020 0438   EOSABS 0.0 09/07/2020 0438   BASOSABS 0.0 09/07/2020 0438    Assessment/Plan:  1. AKI- presumably due to ischemic ATN associted with covid-19 infection. Scr stable at 1.3 and remains non-oliguric- has not required any RRT. Azotemia due to corticosteroids, tube feeds, diuretics, and hemolysis 2. 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 Continue to follow LDH and H/H. 1. Had hypotension during plasmapheresis on 09/16/20 requiring neo-synephrine- now holding antihypertensives peri plex.  2. Right femoral HD catheter placed 09/13/20 by PCCM- 8 days. 3. For PLEX treatment #5/5 today.  4. LDH down  3. Acute hypoxic respiratory failure- secondary to covid-19 infection on VV ECMO  1. S/p trach 09/14/20 4. Hypernatremia- improved /stable with increased free water volume and follow.  5. 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 HCAP/MRSA pneumonitis/tracheitis- on IV vancomycin. 7. HTN/volume-  On norvasc/clonidine/lasix/metoprolol and prn hydralazine- BP overall high, steroids not helping 8. Hyperkalemia-  lokelma  started-  Again steroids not helping  9. Poor prognosis    Louis Meckel  [employer] [phone]

## 2020-09-21 NOTE — Progress Notes (Signed)
NAME:  Donald Rowe, MRN:  [id number], DOB:  1960/12/31, LOS: 23 ADMISSION DATE:  09/07/2020, CONSULTATION DATE:  12/6 REFERRING MD:  Dr. Aileen Fass, CHIEF COMPLAINT:  SOB    Brief History   59 y/o M admitted 12/4 with 1 week hx of SOB, known COVID positive.  He is unvaccinated.  CTA chest negative for PE but demonstrated diffuse bilateral infiltrates.   History of present illness   59 y/o unvaccinated male, admitted 12/4 with a one week history of shortness of breath.    The patient began feeling poorly the day before Thanksgiving with fever, weakness and body aches. He tested positive for COVID on 12/27 prior to presentation.  He developed progressive shortness of breath.  In the ER, the patient was found to have saturations of 78%.  He was started on 6L O2 with initial improvement in saturations.  The patient was admitted per [facility]. CTA of the Chest was assessed and negative for pulmonary embolism but showed diffuse bilateral infiltrates. He was treated with IV remdesivir, steroids and baricitinib.  Initially, he was also treated with antibiotics but these were stopped on 12/6 with a low procalcitonin.  LE doppler negative for DVT. The patient elected to be DNR / no intubation.  He developed worsening respiratory failure with hypoxia requiring 60L flow 100% and PCCM consulted 12/6 for evaluation.   Past Medical History  Hypothyroidism  HLD   Significant Hospital Events/Procedures  12/04 Admit  12/06 PCCM consulted  12/07 Intubated, central line, a line 12/08 VV ECMO Cannulaton, cortrak 12/13 circuit change for hemolytic anemia 12/16 Tracheostomy  12/17 bronch w/ BAL  Consults:  PCCM, Heart failure, TCTS, ECMO  Significant Diagnostic Tests:   CTA Chest 12/4 >> extensive bilateral airspace disease, no large PE identified, limited study   LE Venous Duplex 12/4 >> negative for DVT bilaterally   LE venous duplex 12/8> BLE DVTs  RUE venous duplex 12/23  RUE arterial duplex  12/23  Micro Data:  COVID 12/4 >> negative  Influenza A/B 12/4 >> negative  MRSA PCR 12/5 >> negative  BCx2 12/4 >> NG Trach aspirate 12/12> rare MRSA BAL 12/17> MRSA, candida Aspergillus Ag BAL 12/17> 0.05 12/18 sars2: positive  Antimicrobials:  Azithromycin 12/5 >> 12/6  Ceftriaxone 12/5 >> 12/6  Cefepime 12/7>12/14 vanc 12/14>12/20 Meropenem 12/17>12/21  Interim history/subjective:  12/23: transient hypotension yesterday and overnight resulting in brief need for levo. Now off and hypertensive. Appears to correlate with anti-htn and sedation med dosing. Suggested maximizing doses to minimize multuple meds and also decreasing infusion of sedation prior to dosing po sedation meds in order to cont transition.   Yesterday was able to stop versed. Remains on 10 propofol and fentanyl infusion. We will attempt to change to precedex to stop propofol. Adjusting insulin as decreasing steroids.   RUE cool to touch and more mottled than left. Pulses intact however. picc line but functional so even if clot present would cont to utilize.   Objective   Blood pressure (!) 195/70, pulse (!) 121, temperature 98.1 F (36.7 C), temperature source Core, resp. rate (!) 24, height _0  (1.753 m), weight 109.1 kg, SpO2 97 %.    Vent Mode: PCV FiO2 (%):  [40 %] 40 % Set Rate:  [12 bmp] 12 bmp PEEP:  [10 cmH20] 10 cmH20 Plateau Pressure:  [24 cmH20] 24 cmH20   Intake/Output Summary (Last 24 hours) at 09/21/2020 0820 Last data filed at 09/21/2020 0800 Gross per 24 hour  Intake 6433.36 ml  Output 8400 ml  Net -1966.64 ml   Filed Weights   09/19/20 0954 09/20/20 0424 09/21/20 0600  Weight: 119 kg 114.6 kg 109.1 kg    Examination: Gen: Well-developed, ill-appearing sedated on vent, blinks to threat but otherwise minimal withdrawal from painful stim HEENT: NCAT, perrla Neck: supple, ROM intact, ECMO lines intact and functioning CV: Tachycardic, regular rhythm Pulm: Distant breath sounds, no  obvious wheeze or rales Abd: Soft, BS+, NTND Extm: Peripheral pulses intact 2+ radial pulse on RUE, diffuse edema Skin: Dry, Warm, small areas of ecchymois throughout B/L fingers with some mottling in 1-2 digits R>>>L Neuro: RASS-4  Resolved Hospital Problem list      Assessment & Plan:   Acute hypoxemic and hypercarbic respiratory failure secondary to COVID PNA with severe ARDS. S/p VV ECMO cannulation 09/04/2020.  Tracheostomy 08/81 complicated by trach site bleeding HAP Completed remdesivir.  Completed baricitinib.  Completed CAP coverage. Vanc d/c after 7 day course. Meropenem  after 5 day course. Wbc decreasing - Appreciate heart failure recs -c/w full ECMO support; bival for [facility] -C/w solumedrol 26m daily -routine trach care -VAP prevention protocol -c/w plex last tx today 5 of 5. -C/w furosemide to 444mBID, as renal function allow.  -pneumomediastinum noted on cxr no ptx appreciated -able to wean sweep to 5 and abg stable oxygenation as well   Agitated delirium requiring titration of IV sedation -able to stop versed yesterday and remains on propofol and fentanyl will attempt to transition propofol to precedex -PAD protocol; goal RASS -1 and tolerance of MV & ECMO -con't seroquel -con't enteral oxycodone and clonazepam  Bilateral lower ext DVTs- provoked by COVID infection -on bivaliriduin; goal PTT 50-55 currently -RUE venous duplex pending with change in arm exam and picc in place but would not really change management as picc is functional so would not remove  Steroid-induced hyperglycemia -improving with reduction in steroids -decrease levemir dosing -con't TF coverage (but decreasing) and SSI PRN  Hypothyroidism -con't PTA synthroid   HLD -con't statin  Hemolytic Anemia Throbocytopenia Hyperbilirubinemia Rheumatologic-associated MAHA remains in differential.  hgb and plt are stable -Con't PLEX (last tx today 5 [id number]. Appreciate Nephrology's  assistance -Transfuse if hgb <8  Hypervolemia Hypernatremia and hyperchloremia Azotemia -large volume diuresis per HF team.  -remains on incredible free water per renal -lasix 50m3mD -strict I/Os -Free water 300cc q4hr  Plan discussed with Palliative Care, Cardiology, ECMO specialist, RN, pharmacy at beside.    Best practice (evaluated daily)  Diet: TF Pain/Anxiety/Delirium protocol (if indicated): as above VAP protocol (if indicated): yes DVT prophylaxis: Bivalirudin GI prophylaxis: pantoprazole  BID Glucose control: basal-bolus Mobility: bedrest Last date of multidisciplinary goals of care discussion: wife updated over the phone 12/23 Summary of discussion con't aggressive care Follow up goals of care discussion due: completed 12/23. Cont with aggressive measures.  Code Status: full Disposition: ICU  Critical care time: The patient is critically ill with multiple organ systems failure and requires high complexity decision making for assessment and support, frequent evaluation and titration of therapies, application of advanced monitoring technologies and extensive interpretation of multiple databases.  Critical care time 40 mins. This represents my time independent of the NPs time taking care of the pt. This is excluding procedures.    [facility] 09/21/2020, 8:20 AM

## 2020-09-21 NOTE — Progress Notes (Signed)
VASCULAR LAB    Right upper extremity venous duplex has been performed.  See CV proc for preliminary results.   Gwyndolyn Guilford, RVT 09/21/2020, 5:02 PM

## 2020-09-21 NOTE — Progress Notes (Signed)
Patient ID: Donald Rowe, male   DOB: 02-09-1961, 59 y.o.   MRN: [id number]    Advanced Heart Failure Rounding Note   Subjective:    12/8 Cannulated for VV ECMO - 32 FR  [facility] [place] 12/9 Extubated/Reintubated for altered mental status 12/13 Circuit change for elevated LDH 12/15 PLEX started 12/16 Tracheostomy 12/23 head CT (normal) for change in mental status.   Dropped BP yesterday and became unresponsive. Received Narcan. Head CT ok. Back to baseline this am. Agitated but not following   Continues to diurese well. Weight down 12 more pounds. Now about 10 pounds from baseline. Due for PLEX 5/5 today.   BP remains labile.   On sweep 4. CXR slightly improved. ECMO looks a bit high (patient was up in standing bed during CXR) but oxygenation stable.   Had PLEX 4/5 yesterday - day 4/5. LDH 434 -> 544 -> 419 -> 551.   ECMO   Speed 3500 Flow 4.7L Sweep 4 dP 24 pVen  -89  ABG: 7.33/58/85/95% Hgb 9.8 LDH 434 -> 544 -> 419 -> 551 Lactic acid 0.8 PTT 52  Vent 40% TVs 500-600s   Objective:   Weight Range:  Vital Signs:   Temp:  [98.06 F (36.7 C)-98.96 F (37.2 C)] 98.1 F (36.7 C) (12/23 0800) Pulse Rate:  [95-123] 118 (12/23 0857) Resp:  [12-36] 27 (12/23 0857) BP: (91-195)/(40-98) 186/66 (12/23 0857) SpO2:  [94 %-100 %] 97 % (12/23 0857) Arterial Line BP: (59-199)/(33-70) 191/60 (12/23 0800) FiO2 (%):  [40 %] 40 % (12/23 0857) Weight:  [109.1 kg] 109.1 kg (12/23 0600) Last BM Date: 09/19/20  Weight change: Filed Weights   09/19/20 0954 09/20/20 0424 09/21/20 0600  Weight: 119 kg 114.6 kg 109.1 kg    Intake/Output:   Intake/Output Summary (Last 24 hours) at 09/21/2020 0901 Last data filed at 09/21/2020 0900 Gross per 24 hour  Intake 6833.58 ml  Output 8700 ml  Net -1866.42 ml     Physical Exam: General:  Sedated on vent HEENT: normal  Neck: supple. + trach. + RIJ ecmo cannula Carotids 2+ bilat; no bruits. No lymphadenopathy or thryomegaly  appreciated. Cor: PMI nondisplaced. Regular tachy. No rubs, gallops or murmurs. Lungs: coarse Abdomen: obese soft, nontender, nondistended. No hepatosplenomegaly. No bruits or masses. Good bowel sounds. Extremities: no cyanosis, clubbing, rash, 2+ edema Neuro: sedated on vent   Telemetry: sinus 100-120 Personally reviewed    Labs: Basic Metabolic Panel: Recent Labs  Lab 09/19/20 0412 09/19/20 0418 09/19/20 1533 09/19/20 1539 09/20/20 0322 09/20/20 0828 09/20/20 1550 09/20/20 1554 09/20/20 1713 09/20/20 2308 09/21/20 0252 09/21/20 0411  NA 150*   < > 149*   < > 149*  153*   < > 149* 150* 150* 151* 149* 150*  K 4.9   < > 5.0   < > 4.9  4.9   < > 5.2* 5.2* 4.8 4.8 5.4* 5.4*  CL 118*  --  118*  --  117*  --  114*  --   --   --   --  115*  CO2 25  --  23  --  25  --  27  --   --   --   --  28  GLUCOSE 160*  --  166*  --  141*  --  151*  --   --   --   --  178*  BUN 93*  --  87*  --  94*  --  91*  --   --   --   --  95*  CREATININE 1.34*  --  1.28*  --  1.28*  --  1.21  --   --   --   --  1.27*  CALCIUM 8.4*  --  7.9*  --  8.2*  --  8.4*  --   --   --   --  8.4*   < > = values in this interval not displayed.    Liver Function Tests: Recent Labs  Lab 09/17/20 0454 09/18/20 0312 09/19/20 0412 09/20/20 0322 09/21/20 0411  AST 40 39 133* 58* 51*  ALT 39 50* 159* 77* 68*  ALKPHOS 70 87 110 66 81  BILITOT 2.8* 2.7* 5.0* 2.9* 1.7*  PROT 4.2* 3.9* 4.2* 4.3* 4.8*  ALBUMIN 3.3* 2.8* 2.8* 3.5 3.2*   No results for input(s): LIPASE, AMYLASE in the last 168 hours. No results for input(s): AMMONIA in the last 168 hours.  CBC: Recent Labs  Lab 09/19/20 0412 09/19/20 0418 09/19/20 1533 09/19/20 1539 09/20/20 0322 09/20/20 0828 09/20/20 1550 09/20/20 1554 09/20/20 1713 09/20/20 2308 09/21/20 0252 09/21/20 0411  WBC 16.6*  --  15.8*  --  17.9*  --  18.7*  --   --   --   --  17.3*  HGB 9.5*   < > 9.0*   < > 8.7*  8.2*   < > 10.3* 10.2* 9.2* 9.5* 9.2* 9.8*  HCT  28.5*   < > 28.5*   < > 26.0*  24.0*   < > 31.2* 30.0* 27.0* 28.0* 27.0* 29.7*  MCV 95.6  --  97.9  --  97.4  --  92.3  --   --   --   --  95.2  PLT 155  --  139*  --  146*  --  150  --   --   --   --  144*   < > = values in this interval not displayed.    Cardiac Enzymes: No results for input(s): CKTOTAL, CKMB, CKMBINDEX, TROPONINI in the last 168 hours.  BNP: BNP (last 3 results) No results for input(s): BNP in the last 8760 hours.  ProBNP (last 3 results) No results for input(s): PROBNP in the last 8760 hours.    Other results:  Imaging: DG CHEST PORT 1 VIEW  Result Date: 09/21/2020 CLINICAL DATA:  Hypoxia EXAM: PORTABLE CHEST 1 VIEW COMPARISON:  September 20, 2020 FINDINGS: Tracheostomy catheter tip is 6.1 cm above the carina. ECMO catheter tip is in the inferior vena cava. Enteric tube tip is below the diaphragm. No pneumothorax. Pneumomediastinum, however, is present, similar in appearance to 1 day prior. Subcutaneous air noted on the left. Airspace opacity throughout the lungs bilaterally is stable with small pleural effusions bilaterally. Heart is mildly enlarged with pulmonary vascularity normal. No adenopathy. No bone lesions. IMPRESSION: Tube and catheter positions as described without pneumothorax. Pneumomediastinum again noted, similar to 1 day prior. Subcutaneous air now present on the left. Widespread airspace opacity bilaterally without appreciable change. Stable cardiac prominence. Electronically Signed   By: Lowella Grip III M.D.   On: 09/21/2020 07:54   DG CHEST PORT 1 VIEW  Result Date: 09/20/2020 CLINICAL DATA:  Intubation.  COVID-19. EXAM: PORTABLE CHEST 1 VIEW COMPARISON:  Yesterday FINDINGS: New lucency around the mediastinum on both sides. No soft tissue emphysema or pneumothorax. Confluent airspace disease on both sides. Stable positioning of PICC, ECMO catheter, feeding tube, and tracheostomy tube. Stable borderline heart size. IMPRESSION: 1. New  pneumomediastinum.  No pneumothorax. 2. Stable hardware  positioning and confluent pulmonary opacification. Electronically Signed   By: Monte Fantasia M.D.   On: 09/20/2020 08:14     Medications:     Scheduled Medications: . sodium chloride   Intravenous Once  . amLODipine  10 mg Per Tube QHS  . aspirin  81 mg Per Tube Daily  . calcium gluconate  2 g Intravenous Once  . chlorhexidine gluconate (MEDLINE KIT)  15 mL Mouth Rinse BID  . Chlorhexidine Gluconate Cloth  6 each Topical Daily  . clonazePAM  2 mg Per Tube BID  . cloNIDine  0.2 mg Per Tube TID  . docusate  100 mg Per Tube BID  . feeding supplement (PROSource TF)  45 mL Per Tube TID  . fiber  1 packet Per Tube BID  . free water  300 mL Per Tube Q4H  . furosemide  40 mg Intravenous BID  . hydrALAZINE  25 mg Per Tube TID AC  . insulin aspart  2-6 Units Subcutaneous Q4H  . insulin aspart  5 Units Subcutaneous Q4H  . insulin detemir  25 Units Subcutaneous Q12H  . levothyroxine  50 mcg Per Tube Q0600  . mouth rinse  15 mL Mouth Rinse 10 times per day  . methylPREDNISolone (SOLU-MEDROL) injection  40 mg Intravenous Q24H  . metoprolol tartrate  50 mg Per Tube TID PC  . oxyCODONE  15 mg Per Tube Q6H  . pantoprazole sodium  40 mg Per Tube BID  . pravastatin  40 mg Per Tube q1800  . QUEtiapine  100 mg Per Tube BID  . sodium chloride flush  10-40 mL Intracatheter Q12H  . sodium zirconium cyclosilicate  5 g Oral BID    Infusions: . sodium chloride 10 mL/hr at 09/21/20 0900  . sodium chloride 10 mL/hr at 09/20/20 1900  . albumin human    . albumin human Stopped (09/19/20 1804)  . bivalirudin (ANGIOMAX) infusion 0.5 mg/mL (Non-ACS indications) 0.12 mg/kg/hr (09/21/20 0900)  . citrate dextrose    . dexmedetomidine (PRECEDEX) IV infusion 0.6 mcg/kg/hr (09/21/20 0900)  . dextrose    . feeding supplement (PIVOT 1.5 CAL) 1,000 mL (09/21/20 0347)  . fentaNYL infusion INTRAVENOUS 250 mcg/hr (09/21/20 0900)  . midazolam Stopped  (09/20/20 0940)  . norepinephrine (LEVOPHED) Adult infusion    . propofol (DIPRIVAN) infusion 10 mcg/kg/min (09/21/20 0900)    PRN Medications: sodium chloride, acetaminophen (TYLENOL) oral liquid 160 mg/5 mL, acetaminophen, albumin human, albuterol, dextrose, diphenhydrAMINE, diphenhydrAMINE, fentaNYL, Gerhardt's butt cream, heparin, labetalol, lip balm, LORazepam, ondansetron **OR** ondansetron (ZOFRAN) IV, phenylephrine, polyethylene glycol, sodium chloride flush   Assessment/Plan:   1. Acute hypoxic respiratory failure/ARDS in setting of COVID-19 PNA - Cannulated for VV ECMO on 12/8 after failing intubation x 2 days - has complete remedisivir, baricitinib, Solumedrol.  - s/p trach 12/16 - continues to diurese briskly now about 10 pounds up from baseline. Creatinine stable. Will drop IV lasix to 40 IV daily - CXR stable to slightly better this am.  Sweep down to 4 Oxygenation improved. Continue to wean sweep as tolerated. Cannula slightly high but oxygenation ok. Can reposition as needed - evidence of worsening acidosis/infection 12/17 mero added. Mero completed 12/21 - staph in tracheal aspirate -  vanc added 12/14 mero added 12/17. vanc complete 12/20 - circuit changed 12/13 for elevated LDH and lower flows. PLEX started 12/15. S/p PLEX #4 on 12/21 Saturday. Will get 5th run today. LDH seems to go up quickly when off PLEX - PTT goal turned down to  50-60 due to bleeding. Improved. Circuit looks ok. PTT 52 today. Bleeding has resolved. Discussed dosing with PharmD personally. - Pulling good volumes on vent - Mental status remains a major issue. Switch to Precedx and see if improves. Head CT ok.   2. Bilateral DVT - remains on bival - Dosing d/w PharmD - no change  3. DM2 - Insulin adjusted. CBGs improved   4. Obesity -Body mass index is 33.73 kg/m.  5. F/E/N - Stable 150 - Continue free water - adjust dosing as needed  6. HTN - Remains very labile  - Meds adjusted  again . May have to tolerate some permissive hypertension (especially during periods of agitation) to avoid hypotension. Hold anti-HTN meds prior to PLEX - no change  7. Dusky R thumb - improved  8. AKI - diuresing briskly. BUN/CR stable - can use PLEX line for HD if needed - decrease lasix today  9. Acute encephalopathy - remains agitated  - head CT ok 09/07/20 and 12/21 - switching to Precedex  Plan discussed on multi-discplinary ECMO rounds with CCM, ECMO coordinator/specialist, PharmDs and RNs.  CRITICAL CARE Performed by: Glori Bickers  CRITICAL CARE Performed by: Glori Bickers  Total critical care time: 40 minutes  Critical care time was exclusive of separately billable procedures and treating other patients.  Critical care was necessary to treat or prevent imminent or life-threatening deterioration.  Critical care was time spent personally by me (independent of midlevel providers or residents) on the following activities: development of treatment plan with patient and/or surrogate as well as nursing, discussions with consultants, evaluation of patient's response to treatment, examination of patient, obtaining history from patient or surrogate, ordering and performing treatments and interventions, ordering and review of laboratory studies, ordering and review of radiographic studies, pulse oximetry and re-evaluation of patient's condition.    Length of Stay: 56   Glori Bickers MD 09/21/2020, 9:01 AM  Advanced Heart Failure Team Pager [phone] (M-F; [place])  Please contact [facility] for night-coverage after hours (4p -7a ) and weekends on [email or web address]

## 2020-09-21 NOTE — Progress Notes (Signed)
ANTICOAGULATION CONSULT NOTE  Pharmacy Consult for bivalirudin Indication: ECMO + bilateral DVTs   Recent Labs    09/19/20 0412 09/19/20 0418 09/20/20 0322 09/20/20 0828 09/20/20 1550 09/20/20 1554 09/21/20 0411 09/21/20 1219 09/21/20 1522  HGB 9.5*   < > 8.7*  8.2*   < > 10.3*   < > 9.8* 9.2* 8.2*  HCT 28.5*   < > 26.0*  24.0*   < > 31.2*   < > 29.7* 27.0* 26.5*  PLT 155   < > 146*  --  150  --  144*  --  141*  APTT 49*   < > 59*  --  53*  --  52*  --  57*  LABPROT 21.8*  --  28.2*  --   --   --  20.5*  --   --   INR 2.0*  --  2.8*  --   --   --  1.8*  --   --   CREATININE 1.34*   < > 1.28*  --  1.21  --  1.27*  --  1.18   < > = values in this interval not displayed.    Estimated Creatinine Clearance: 82.1 mL/min (by C-G formula based on SCr of 1.18 mg/dL).   Assessment: 27 yom unvaccinated presenting with severe COVID ARDs, intubated on 12/6, continued to have ventilation issues despite NMB and proning - started on VV ECMO on 12/8.  D-dimer >20 > now improved 14, found to have bilateral DVTs.  Given significant bleeding issues from trach, targeting lower aPTT goal of 50-55 seconds after discussion with ECMO team; but bleeding now improving, so changed goal to 50-60 seconds.   aPTT this pm is therapeutic at 57.  Hgb down 8.2, plt 141. Fibrinogen 274, LDH 551. No s/sx of bleeding or infusion issues. Had 2 units of PRBCs yesterday.  Goal of Therapy:  aPTT 50-60 seconds Monitor platelets by anticoagulation protocol: Yes   Plan:  Continue IV bivalirudin to 0.12 mg/kg/hr. Check q12h aPTT Follow bleeding, CBC  Sheppard Coil PharmD., BCPS Clinical Pharmacist 09/21/2020 5:50 PM   Please check AMION for all [facility] phone numbers After 10:00 PM, call Main Pharmacy [phone]

## 2020-09-21 NOTE — Progress Notes (Signed)
Palliative:  Donald Rowe goes by "[name]" not Juniorand not Donald Rowe (he hates these names per wife).  HPI:59 y.o.malewith a pertinent history ofhypothyroidism on levothyroxine and hyperlipidemia who was diagnosed with Covid 1 week ago and is not vaccinated. With EMS he was seen to bedesaturatingto 78% on room air. He states he started having symptoms on Friday and tested positive on Saturday.Had initially elected to be DNAR without intubation though later changed this and got intubated on 12/6 in the setting of worsening respiratory failure with hypoxia. CTA chest w/diffuse bilateral infiltrates.On 12/8 was cannulated for VV ECMO.Had been extubated on 12/9 then emergently reintubated in early eveningdue to clinical instability. Palliative care was asked to get involved in the setting of VV ECMO to set goals and expectations withJ.R.'sfamily.He continues on VV ECMO and ventilator support.ECMO circuit change 09/11/20.PLEX began 12/15.    I met today during ECMO rounds. I called Donald Rowe for update after rounds. Discussed that they are continuing efforts to wean ECMO support. PXCR looks good. Plans for adjustment and changes to sedation and BP meds to better control BP extremes of highs and lows and hopes to continue to try and wake him up a little more. He was able to tolerate standing position in bed for 1.5 hrs. Plans to assess RUE to rule out clot given swelling and concern with poor circulation (cool and some mottling). Overall he remains stable. Donald Rowe asks good questions that I answered to the best of my ability. Donald Rowe reports that herself as well as Donald Rowe find comfort in being able to visit with him and spend time with him in person.   All questions/concerns addressed. Emotional support provided.   Exam:Sedated on ventvia trach. VV ECMO. Cortrak in place.No distress.40% FiO2 and tolerating ventwith no dyssynchrony.RUE swollen, mottled, cool. Abd soft.  Plan: - Continue  aggressive care with hopes of eventual improvement.  [place], NP Palliative Medicine Team Pager [phone] (Please see [email or web address] for schedule) Team Phone [phone]    Greater than 50%  of this time was spent counseling and coordinating care related to the above assessment and plan

## 2020-09-22 ENCOUNTER — Inpatient Hospital Stay (HOSPITAL_COMMUNITY): Payer: HRSA Program

## 2020-09-22 DIAGNOSIS — J9601 Acute respiratory failure with hypoxia: Secondary | ICD-10-CM | POA: Diagnosis not present

## 2020-09-22 DIAGNOSIS — Z515 Encounter for palliative care: Secondary | ICD-10-CM | POA: Diagnosis not present

## 2020-09-22 DIAGNOSIS — J80 Acute respiratory distress syndrome: Secondary | ICD-10-CM | POA: Diagnosis not present

## 2020-09-22 DIAGNOSIS — U071 COVID-19: Secondary | ICD-10-CM | POA: Diagnosis not present

## 2020-09-22 DIAGNOSIS — Z7189 Other specified counseling: Secondary | ICD-10-CM | POA: Diagnosis not present

## 2020-09-22 LAB — POCT I-STAT 7, (LYTES, BLD GAS, ICA,H+H)
Acid-Base Excess: 5 mmol/L — ABNORMAL HIGH (ref 0.0–2.0)
Acid-Base Excess: 5 mmol/L — ABNORMAL HIGH (ref 0.0–2.0)
Acid-Base Excess: 8 mmol/L — ABNORMAL HIGH (ref 0.0–2.0)
Acid-Base Excess: 5 mmol/L — ABNORMAL HIGH (ref 0.0–2.0)
Acid-Base Excess: 7 mmol/L — ABNORMAL HIGH (ref 0.0–2.0)
Acid-Base Excess: 7 mmol/L — ABNORMAL HIGH (ref 0.0–2.0)
Acid-Base Excess: 9 mmol/L — ABNORMAL HIGH (ref 0.0–2.0)
Bicarbonate: 31.9 mmol/L — ABNORMAL HIGH (ref 20.0–28.0)
Bicarbonate: 33.3 mmol/L — ABNORMAL HIGH (ref 20.0–28.0)
Bicarbonate: 35.8 mmol/L — ABNORMAL HIGH (ref 20.0–28.0)
Bicarbonate: 33 mmol/L — ABNORMAL HIGH (ref 20.0–28.0)
Bicarbonate: 34.4 mmol/L — ABNORMAL HIGH (ref 20.0–28.0)
Bicarbonate: 33.5 mmol/L — ABNORMAL HIGH (ref 20.0–28.0)
Bicarbonate: 34.6 mmol/L — ABNORMAL HIGH (ref 20.0–28.0)
Calcium, Ion: 1.29 mmol/L (ref 1.15–1.40)
Calcium, Ion: 1.24 mmol/L (ref 1.15–1.40)
Calcium, Ion: 1.31 mmol/L (ref 1.15–1.40)
Calcium, Ion: 1.26 mmol/L (ref 1.15–1.40)
Calcium, Ion: 1.27 mmol/L (ref 1.15–1.40)
Calcium, Ion: 1.29 mmol/L (ref 1.15–1.40)
Calcium, Ion: 1.28 mmol/L (ref 1.15–1.40)
HCT: 24 % — ABNORMAL LOW (ref 39.0–52.0)
HCT: 22 % — ABNORMAL LOW (ref 39.0–52.0)
HCT: 24 % — ABNORMAL LOW (ref 39.0–52.0)
HCT: 22 % — ABNORMAL LOW (ref 39.0–52.0)
HCT: 23 % — ABNORMAL LOW (ref 39.0–52.0)
HCT: 24 % — ABNORMAL LOW (ref 39.0–52.0)
HCT: 22 % — ABNORMAL LOW (ref 39.0–52.0)
Hemoglobin: 8.2 g/dL — ABNORMAL LOW (ref 13.0–17.0)
Hemoglobin: 7.5 g/dL — ABNORMAL LOW (ref 13.0–17.0)
Hemoglobin: 8.2 g/dL — ABNORMAL LOW (ref 13.0–17.0)
Hemoglobin: 7.5 g/dL — ABNORMAL LOW (ref 13.0–17.0)
Hemoglobin: 7.8 g/dL — ABNORMAL LOW (ref 13.0–17.0)
Hemoglobin: 8.2 g/dL — ABNORMAL LOW (ref 13.0–17.0)
Hemoglobin: 7.5 g/dL — ABNORMAL LOW (ref 13.0–17.0)
O2 Saturation: 95 %
O2 Saturation: 93 %
O2 Saturation: 96 %
O2 Saturation: 96 %
O2 Saturation: 94 %
O2 Saturation: 97 %
O2 Saturation: 96 %
Patient temperature: 37
Patient temperature: 37
Patient temperature: 37.1
Patient temperature: 36.9
Patient temperature: 37.1
Patient temperature: 37.1
Patient temperature: 37.1
Potassium: 4.8 mmol/L (ref 3.5–5.1)
Potassium: 5 mmol/L (ref 3.5–5.1)
Potassium: 5.3 mmol/L — ABNORMAL HIGH (ref 3.5–5.1)
Potassium: 5.2 mmol/L — ABNORMAL HIGH (ref 3.5–5.1)
Potassium: 5.2 mmol/L — ABNORMAL HIGH (ref 3.5–5.1)
Potassium: 5.3 mmol/L — ABNORMAL HIGH (ref 3.5–5.1)
Potassium: 5.2 mmol/L — ABNORMAL HIGH (ref 3.5–5.1)
Sodium: 150 mmol/L — ABNORMAL HIGH (ref 135–145)
Sodium: 151 mmol/L — ABNORMAL HIGH (ref 135–145)
Sodium: 151 mmol/L — ABNORMAL HIGH (ref 135–145)
Sodium: 151 mmol/L — ABNORMAL HIGH (ref 135–145)
Sodium: 151 mmol/L — ABNORMAL HIGH (ref 135–145)
Sodium: 151 mmol/L — ABNORMAL HIGH (ref 135–145)
Sodium: 151 mmol/L — ABNORMAL HIGH (ref 135–145)
TCO2: 34 mmol/L — ABNORMAL HIGH (ref 22–32)
TCO2: 36 mmol/L — ABNORMAL HIGH (ref 22–32)
TCO2: 38 mmol/L — ABNORMAL HIGH (ref 22–32)
TCO2: 35 mmol/L — ABNORMAL HIGH (ref 22–32)
TCO2: 36 mmol/L — ABNORMAL HIGH (ref 22–32)
TCO2: 35 mmol/L — ABNORMAL HIGH (ref 22–32)
TCO2: 36 mmol/L — ABNORMAL HIGH (ref 22–32)
pCO2 arterial: 57.5 mmHg — ABNORMAL HIGH (ref 32.0–48.0)
pCO2 arterial: 75.5 mmHg (ref 32.0–48.0)
pCO2 arterial: 80.4 mmHg (ref 32.0–48.0)
pCO2 arterial: 69.3 mmHg (ref 32.0–48.0)
pCO2 arterial: 65.9 mmHg (ref 32.0–48.0)
pCO2 arterial: 58.9 mmHg — ABNORMAL HIGH (ref 32.0–48.0)
pCO2 arterial: 56.4 mmHg — ABNORMAL HIGH (ref 32.0–48.0)
pH, Arterial: 7.352 (ref 7.350–7.450)
pH, Arterial: 7.225 — ABNORMAL LOW (ref 7.350–7.450)
pH, Arterial: 7.285 — ABNORMAL LOW (ref 7.350–7.450)
pH, Arterial: 7.285 — ABNORMAL LOW (ref 7.350–7.450)
pH, Arterial: 7.326 — ABNORMAL LOW (ref 7.350–7.450)
pH, Arterial: 7.364 (ref 7.350–7.450)
pH, Arterial: 7.396 (ref 7.350–7.450)
pO2, Arterial: 82 mmHg — ABNORMAL LOW (ref 83.0–108.0)
pO2, Arterial: 103 mmHg (ref 83.0–108.0)
pO2, Arterial: 80 mmHg — ABNORMAL LOW (ref 83.0–108.0)
pO2, Arterial: 93 mmHg (ref 83.0–108.0)
pO2, Arterial: 81 mmHg — ABNORMAL LOW (ref 83.0–108.0)
pO2, Arterial: 94 mmHg (ref 83.0–108.0)
pO2, Arterial: 84 mmHg (ref 83.0–108.0)

## 2020-09-22 LAB — CBC
HCT: 21.8 % — ABNORMAL LOW (ref 39.0–52.0)
HCT: 25.1 % — ABNORMAL LOW (ref 39.0–52.0)
HCT: 25.5 % — ABNORMAL LOW (ref 39.0–52.0)
HCT: 27.4 % — ABNORMAL LOW (ref 39.0–52.0)
Hemoglobin: 7 g/dL — ABNORMAL LOW (ref 13.0–17.0)
Hemoglobin: 8 g/dL — ABNORMAL LOW (ref 13.0–17.0)
Hemoglobin: 8.1 g/dL — ABNORMAL LOW (ref 13.0–17.0)
Hemoglobin: 8.3 g/dL — ABNORMAL LOW (ref 13.0–17.0)
MCH: 29.5 pg (ref 26.0–34.0)
MCH: 30.7 pg (ref 26.0–34.0)
MCH: 31.1 pg (ref 26.0–34.0)
MCH: 31.1 pg (ref 26.0–34.0)
MCHC: 30.3 g/dL (ref 30.0–36.0)
MCHC: 31.8 g/dL (ref 30.0–36.0)
MCHC: 31.9 g/dL (ref 30.0–36.0)
MCHC: 32.1 g/dL (ref 30.0–36.0)
MCV: 96.6 fL (ref 80.0–100.0)
MCV: 96.9 fL (ref 80.0–100.0)
MCV: 97.5 fL (ref 80.0–100.0)
MCV: 97.7 fL (ref 80.0–100.0)
Platelets: 135 10*3/uL — ABNORMAL LOW (ref 150–400)
Platelets: 146 10*3/uL — ABNORMAL LOW (ref 150–400)
Platelets: 151 10*3/uL (ref 150–400)
Platelets: 154 10*3/uL (ref 150–400)
RBC: 2.25 MIL/uL — ABNORMAL LOW (ref 4.22–5.81)
RBC: 2.57 MIL/uL — ABNORMAL LOW (ref 4.22–5.81)
RBC: 2.64 MIL/uL — ABNORMAL LOW (ref 4.22–5.81)
RBC: 2.81 MIL/uL — ABNORMAL LOW (ref 4.22–5.81)
RDW: 19.9 % — ABNORMAL HIGH (ref 11.5–15.5)
RDW: 20.1 % — ABNORMAL HIGH (ref 11.5–15.5)
RDW: 20.4 % — ABNORMAL HIGH (ref 11.5–15.5)
RDW: 20.6 % — ABNORMAL HIGH (ref 11.5–15.5)
WBC: 10.4 10*3/uL (ref 4.0–10.5)
WBC: 11.3 10*3/uL — ABNORMAL HIGH (ref 4.0–10.5)
WBC: 13 10*3/uL — ABNORMAL HIGH (ref 4.0–10.5)
WBC: 8.9 10*3/uL (ref 4.0–10.5)
nRBC: 0 % (ref 0.0–0.2)
nRBC: 0 % (ref 0.0–0.2)
nRBC: 0.2 % (ref 0.0–0.2)
nRBC: 0.2 % (ref 0.0–0.2)

## 2020-09-22 LAB — TYPE AND SCREEN
ABO/RH(D): O POS
Antibody Screen: NEGATIVE
Unit division: 0
Unit division: 0
Unit division: 0
Unit division: 0
Unit division: 0
Unit division: 0

## 2020-09-22 LAB — BPAM RBC
Blood Product Expiration Date: 202201142359
Blood Product Expiration Date: 202201162359
Blood Product Expiration Date: 202201182359
Blood Product Expiration Date: 202201182359
Blood Product Expiration Date: 202201242359
Blood Product Expiration Date: 202201252359
ISSUE DATE / TIME: 202112220947
ISSUE DATE / TIME: 202112220947
Unit Type and Rh: 5100
Unit Type and Rh: 5100
Unit Type and Rh: 5100
Unit Type and Rh: 5100
Unit Type and Rh: 5100
Unit Type and Rh: 5100

## 2020-09-22 LAB — BASIC METABOLIC PANEL
Anion gap: 7 (ref 5–15)
Anion gap: 8 (ref 5–15)
BUN: 91 mg/dL — ABNORMAL HIGH (ref 6–20)
BUN: 89 mg/dL — ABNORMAL HIGH (ref 6–20)
CO2: 30 mmol/L (ref 22–32)
CO2: 31 mmol/L (ref 22–32)
Calcium: 8.4 mg/dL — ABNORMAL LOW (ref 8.9–10.3)
Calcium: 8.7 mg/dL — ABNORMAL LOW (ref 8.9–10.3)
Chloride: 113 mmol/L — ABNORMAL HIGH (ref 98–111)
Chloride: 112 mmol/L — ABNORMAL HIGH (ref 98–111)
Creatinine, Ser: 1.15 mg/dL (ref 0.61–1.24)
Creatinine, Ser: 1.08 mg/dL (ref 0.61–1.24)
GFR, Estimated: >60 mL/min (ref >60)
GFR, Estimated: >60 mL/min (ref >60)
Glucose, Bld: 154 mg/dL — ABNORMAL HIGH (ref 70–99)
Glucose, Bld: 160 mg/dL — ABNORMAL HIGH (ref 70–99)
Potassium: 4.8 mmol/L (ref 3.5–5.1)
Potassium: 5.4 mmol/L — ABNORMAL HIGH (ref 3.5–5.1)
Sodium: 150 mmol/L — ABNORMAL HIGH (ref 135–145)
Sodium: 151 mmol/L — ABNORMAL HIGH (ref 135–145)

## 2020-09-22 LAB — GLUCOSE, CAPILLARY
Glucose-Capillary: 138 mg/dL — ABNORMAL HIGH (ref 70–99)
Glucose-Capillary: 92 mg/dL (ref 70–99)
Glucose-Capillary: 131 mg/dL — ABNORMAL HIGH (ref 70–99)
Glucose-Capillary: 151 mg/dL — ABNORMAL HIGH (ref 70–99)
Glucose-Capillary: 110 mg/dL — ABNORMAL HIGH (ref 70–99)
Glucose-Capillary: 153 mg/dL — ABNORMAL HIGH (ref 70–99)

## 2020-09-22 LAB — HEPATIC FUNCTION PANEL
ALT: 62 U/L — ABNORMAL HIGH (ref 0–44)
AST: 49 U/L — ABNORMAL HIGH (ref 15–41)
Albumin: 2.9 g/dL — ABNORMAL LOW (ref 3.5–5.0)
Alkaline Phosphatase: 100 U/L (ref 38–126)
Bilirubin, Direct: 0.8 mg/dL — ABNORMAL HIGH (ref 0.0–0.2)
Indirect Bilirubin: 1.1 mg/dL — ABNORMAL HIGH (ref 0.3–0.9)
Total Bilirubin: 1.9 mg/dL — ABNORMAL HIGH (ref 0.3–1.2)
Total Protein: 4.7 g/dL — ABNORMAL LOW (ref 6.5–8.1)

## 2020-09-22 LAB — FIBRINOGEN: Fibrinogen: 383 mg/dL (ref 210–475)

## 2020-09-22 LAB — TRIGLYCERIDES: Triglycerides: 67 mg/dL (ref <150)

## 2020-09-22 LAB — PROTIME-INR
INR: 1.7 — ABNORMAL HIGH (ref 0.8–1.2)
Prothrombin Time: 19.1 seconds — ABNORMAL HIGH (ref 11.4–15.2)

## 2020-09-22 LAB — LACTIC ACID, PLASMA
Lactic Acid, Venous: 0.8 mmol/L (ref 0.5–1.9)
Lactic Acid, Venous: 0.8 mmol/L (ref 0.5–1.9)

## 2020-09-22 LAB — APTT
aPTT: 56 seconds — ABNORMAL HIGH (ref 24–36)
aPTT: 61 seconds — ABNORMAL HIGH (ref 24–36)

## 2020-09-22 LAB — LACTATE DEHYDROGENASE: LDH: 579 U/L — ABNORMAL HIGH (ref 98–192)

## 2020-09-22 LAB — FUNGITELL, SERUM: Fungitell Result: 67 pg/mL (ref <80)

## 2020-09-22 MED ORDER — FREE WATER
400.0000 mL | Status: DC
  Administered 2020-09-22 – 2020-09-23 (×5): 400 mL

## 2020-09-22 MED ORDER — SODIUM CHLORIDE 0.9 % IV SOLN
0.0900 mg/kg/h | INTRAVENOUS | Status: DC
  Administered 2020-09-22 – 2020-09-23 (×2): 0.1 mg/kg/h via INTRAVENOUS
  Filled 2020-09-22 (×3): qty 250

## 2020-09-22 MED ORDER — SODIUM CHLORIDE 0.9% FLUSH
10.0000 mL | INTRAVENOUS | Status: DC | PRN
  Administered 2020-09-26: 21:00:00 10 mL

## 2020-09-22 MED ORDER — SODIUM CHLORIDE 0.9% FLUSH
10.0000 mL | Freq: Two times a day (BID) | INTRAVENOUS | Status: DC
  Administered 2020-09-23 – 2020-10-03 (×16): 10 mL
  Administered 2020-10-05: 30 mL
  Administered 2020-10-05 – 2020-10-08 (×4): 10 mL
  Administered 2020-10-08: 40 mL
  Administered 2020-10-09: 10 mL
  Administered 2020-10-09: 20 mL
  Administered 2020-10-10 – 2020-10-11 (×3): 10 mL
  Administered 2020-10-11: 30 mL

## 2020-09-22 MED ORDER — OXYCODONE HCL 5 MG/5ML PO SOLN
10.0000 mg | Freq: Four times a day (QID) | ORAL | Status: DC
  Administered 2020-09-22 – 2020-10-03 (×40): 10 mg
  Filled 2020-09-22 (×40): qty 10

## 2020-09-22 NOTE — Progress Notes (Signed)
Removed functioning PICC secondary to skin tear under dressing, PICC site clean, dry and intact. Pressure gauze applied to PICC insertion site. Cleaned skin that had small closed areas to lateral side and large skin tear to medial side that appears to be from [facility]. Covered with tegaderm. RN at bedside aware.

## 2020-09-22 NOTE — Progress Notes (Signed)
Physical Therapy Treatment Patient Details Name: Donald Rowe MRN: [id number] DOB: 1961/01/01 Today's Date: 09/22/2020    History of Present Illness 59 y.o. male with a pertinent history of hypothyroidism on levothyroxine and hyperlipidemia who was diagnosed with Covid 11/27 and is not vaccinated.  With EMS SaO2 on RA 78%O2. ED patient was increased to 6 L oxygen satting about 92%,. CTA of the Chest was assessed and negative for pulmonary embolism but showed diffuse bilateral infiltrates. He was treated with IV remdesivir, steroids and baricitinib.  Initially, he was also treated with antibiotics but these were stopped on 12/6 with a low procalcitonin. 12/07 intubated, central line, A line, 12/08 ECMO cannulation, cortrak, attempted extubation 12/9 failed reintubated. Trach placed 12/16.    PT Comments    Pt remains non-responsive despite decreased sedation and pain medications today.   Pt was able to tolerate tilt for 20 minutes at 45 degrees and 5 min at 50 degrees. Pt VSS with sats 93-95%.  Pt with occasional UE and head movement, however nothing to command or purposeful. Does exhibit some mirrored muscle activation on opposite side with PROM of UE.  Pt currently with functional limitations due to decreasing muscle mass and associated strength, balance and endurance deficits. D/c plans remain appropriate. PT will continue to follow acutely.     Follow Up Recommendations  CIR     Equipment Recommendations  Other (comment) (TBD)       Precautions / Restrictions Precautions Precautions: Other (comment) Precaution Comments: COVID+ (off precautions), ECMO, cortrak, intubated,  condom cath, flexiseal Restrictions Weight Bearing Restrictions: No    Mobility  Bed Mobility               General bed mobility comments: Continued: Total A +2 for reposition in bed. Use of kreg tilt bed for semi standing. +2 to manage line and safety.  Transfers                 General transfer  comment: Continued to utilized tilt bed to come to semi-standing position        Balance Overall balance assessment: Needs assistance         Standing balance support: No upper extremity supported;During functional activity Standing balance-Leahy Scale: Poor Standing balance comment: Tilit in kreg bed at 35 degrees for 20 minutes. Pt tolerated well overall.                            Cognition Arousal/Alertness: Suspect due to medications Behavior During Therapy: Flat affect Overall Cognitive Status: Difficult to assess                                 General Comments: pt with 1x eye open that does not respond to threat, maximal mutimodal stimulus      Exercises General Exercises - Upper Extremity Shoulder Flexion: PROM;Both;10 reps;Supine (tilt bed at 45*) Shoulder ABduction: PROM;Both;Supine;10 reps (tilt bed at 45*) Shoulder ADduction: PROM;Both;Supine;10 reps (tilt bed at 45*) Elbow Flexion: PROM;Both;Supine;10 reps (tilt bed at 45*) Elbow Extension: PROM;Both;Supine;10 reps (tilt bed at 45*) Wrist Flexion: PROM;Both;Supine;10 reps (tilt bed at 45*) Wrist Extension: PROM;Both;Supine;10 reps (tilt bed at 45*) Digit Composite Flexion: PROM;Both;Supine;10 reps (tilt bed at 45*) Composite Extension: PROM;Both;Supine;10 reps (tilt bed at 45*) General Exercises - Lower Extremity Ankle Circles/Pumps: PROM;Both;5 reps;Supine Hip Flexion/Marching: PROM;Both;5 reps;Supine Other Exercises Other Exercises: pt able to balance head for  3 minutes once trunk brought forward and head placed in neutral position Other Exercises: cervical flexion Other Exercises: cervical rotation    General Comments General comments (skin integrity, edema, etc.): RN and ECMO specialist present during session, pt tolerated 45 degrees for 20 minutes and 50 degrees for 5 minutes.      Pertinent Vitals/Pain Pain Assessment: Faces Faces Pain Scale: No hurt Pain Location: no  change in response to nail bed pressure or in elevated position Pain Descriptors / Indicators: Grimacing Pain Intervention(s): Monitored during session;Repositioned           PT Goals (current goals can now be found in the care plan section) Acute Rehab PT Goals Patient Stated Goal: Patient tolerating increasing angle in stand table.  Up to 45 degrees. PT Goal Formulation: Patient unable to participate in goal setting Time For Goal Achievement: 09/22/20 Potential to Achieve Goals: Poor Progress towards PT goals: Not progressing toward goals - comment (level of arousal)    Frequency    Min 3X/week      PT Plan Current plan remains appropriate    Co-evaluation PT/OT/SLP Co-Evaluation/Treatment: Yes            AM-PAC PT "6 Clicks" Mobility   Outcome Measure  Help needed turning from your back to your side while in a flat bed without using bedrails?: Total Help needed moving from lying on your back to sitting on the side of a flat bed without using bedrails?: Total Help needed moving to and from a bed to a chair (including a wheelchair)?: Total Help needed standing up from a chair using your arms (e.g., wheelchair or bedside chair)?: Total Help needed to walk in hospital room?: Total Help needed climbing 3-5 steps with a railing? : Total 6 Click Score: 6    End of Session Equipment Utilized During Treatment: Other (comment) (Kreg bed) Activity Tolerance: Patient tolerated treatment well Patient left: in bed;with nursing/sitter in room;with call bell/phone within reach Nurse Communication: Other (comment) (tilt schedule goal 3x/day) PT Visit Diagnosis: Muscle weakness (generalized) (M62.81);Difficulty in walking, not elsewhere classified (R26.2)     Time: 9563-8756 PT Time Calculation (min) (ACUTE ONLY): 42 min  Charges:  $Therapeutic Activity: 8-22 mins                     Donald Rowe PT, DPT Acute Rehabilitation Services Pager [phone] Office [phone]    Donald Rowe 09/22/2020, 3:35 PM

## 2020-09-22 NOTE — Progress Notes (Signed)
Palliative:  [name] goes by "[name]" not Juniorand not Donald Rowe (he hates these names per wife).  HPI:59 y.o.malewith a pertinent history ofhypothyroidism on levothyroxine and hyperlipidemia who was diagnosed with Covid 1 week ago and is not vaccinated. With EMS he was seen to bedesaturatingto 78% on room air. He states he started having symptoms on Friday and tested positive on Saturday.Had initially elected to be DNAR without intubation though later changed this and got intubated on 12/6 in the setting of worsening respiratory failure with hypoxia. CTA chest w/diffuse bilateral infiltrates.On 12/8 was cannulated for VV ECMO.Had been extubated on 12/9 then emergently reintubated in early eveningdue to clinical instability. Palliative care was asked to get involved in the setting of VV ECMO to set goals and expectations withJ.R.'sfamily.He continues on VV ECMO and ventilator support.ECMO circuit change 09/11/20.PLEX began 12/15.    I met today at [name]'s bedside along with wife, Donald Rowe. Update provided to [facility] regarding RUE doppler negative for clot and PICC moved to LUE. Sweep continues at 4 but trying to wean further. Continues to have fluctuating BP. Continue to try to adjust sedation to allow for more awaking to better assess neurological status but not very successful so far as he only becomes agitated and not following any commands. Completed PLEX 4/4 treatments. PCXR is good and labs stable. Lungs seem to be improving so need to continue to wean ECMO/vent and aggressive measures and see what his body if able to pick up and do on it's own. Fortunately his lungs seem to be improving but neurological status is a concern but may still take some time to be able to have adequate assessment.   Donald Rowe has good understanding. She remains hopeful. 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  Donald Rowe shares some of their story of  meeting. Donald Rowe also shares that she has given control to God to care for [name] and she trusts that everything will progress as is meant to be for J,R.. She also knows that his heart and soul are in a good place no matter what the future holds. I agree that this is very healthy to hope for the best but find peace that he is in good hands.   All questions/concerns addressed. Emotional support provided.   Exam: Sedated on vent via trach. VV ECMO. Cortrak in place.No distress.40% FiO2 and tolerating ventwith no dyssynchrony.PICC moved to LUE. RUE looks better today; no evidence of clot. Abd soft.  Plan:  - Continue aggressive care with hopes of eventual improvement.  Oyens, NP Palliative Medicine Team Pager [phone] (Please see [email or web address] for schedule) Team Phone [phone]    Greater than 50%  of this time was spent counseling and coordinating care related to the above assessment and plan

## 2020-09-22 NOTE — Progress Notes (Signed)
ANTICOAGULATION CONSULT NOTE  Pharmacy Consult for bivalirudin Indication: ECMO + bilateral DVTs   Recent Labs    09/20/20 0322 09/20/20 0828 09/21/20 0411 09/21/20 1219 09/21/20 1522 09/21/20 2016 09/22/20 0335 09/22/20 0345 09/22/20 1216 09/22/20 1408 09/22/20 1536 09/22/20 1538  HGB 8.7*  8.2*   < > 9.8*   < > 8.2*   < > 8.3*   < > 8.0* 7.8* 8.1* 8.2*  HCT 26.0*  24.0*   < > 29.7*   < > 26.5*   < > 27.4*   < > 25.1* 23.0* 25.5* 24.0*  PLT 146*   < > 144*  --  141*  --  154  --  135*  --  151  --   APTT 59*   < > 52*  --  57*  --  56*  --   --   --  61*  --   LABPROT 28.2*  --  20.5*  --   --   --  19.1*  --   --   --   --   --   INR 2.8*  --  1.8*  --   --   --  1.7*  --   --   --   --   --   CREATININE 1.28*   < > 1.27*  --  1.18  --  1.15  --   --   --  1.08  --    < > = values in this interval not displayed.    Estimated Creatinine Clearance: 89.7 mL/min (by C-G formula based on SCr of 1.08 mg/dL).   Assessment: 17 yom unvaccinated presenting with severe COVID ARDs, intubated on 12/6, continued to have ventilation issues despite NMB and proning - started on VV ECMO on 12/8.  D-dimer >20 > now improved 14, found to have bilateral DVTs.  Given significant bleeding issues from trach, targeting lower aPTT goal of 50-55 seconds after discussion with ECMO team; but bleeding now improving, so changed goal to 50-60 seconds.   aPTT this evening is just above goal at 61seconds on bivalirudin drip 0.12mg /kg/hr.  Hgb down 8.2 but relatively stable with no bleeding, plt 140-150 stable. Fibrinogen stable 200-300s,, LDH stable in 500s. s/p PRBCs 12/20 and 22.  Goal of Therapy:  aPTT 50-60 seconds Monitor platelets by anticoagulation protocol: Yes   Plan:  Reduce IV bivalirudin to 0.1 mg/kg/hr to keep within range Check q12h aPTT Follow bleeding, CBC  Sheppard Coil PharmD., BCPS Clinical Pharmacist 09/22/2020 4:31 PM  Please check AMION for all [facility] phone  numbers After 10:00 PM, call Main Pharmacy [phone]

## 2020-09-22 NOTE — Progress Notes (Signed)
Occupational Therapy Treatment Patient Details Name: Donald Rowe MRN: [id number] DOB: [birth year] Today's Date: 09/22/2020    History of present illness 59 y.o. male with a pertinent history of hypothyroidism on levothyroxine and hyperlipidemia who was diagnosed with Covid 11/27 and is not vaccinated.  With EMS SaO2 on RA 78%O2. ED patient was increased to 6 L oxygen satting about 92%,. CTA of the Chest was assessed and negative for pulmonary embolism but showed diffuse bilateral infiltrates. He was treated with IV remdesivir, steroids and baricitinib.  Initially, he was also treated with antibiotics but these were stopped on 12/6 with a low procalcitonin. 12/07 intubated, central line, A line, 12/08 ECMO cannulation, cortrak, attempted extubation 12/9 failed reintubated. Trach placed 12/16.   OT comments  Pt moving UEs and head, but does not appear purposeful and demonstrated ability to maintain head at neutral once placed when standing in tilt bed at 50 degrees. Opened eyes once returned to supine, but did not blink to threat. Palpable shoulder subluxation B with muscle atrophy. Tolerating tilt to 45 degrees x 20 min, 50 x 5 min with RR to mid 50s briefly, all other VS stable. Will continue efforts.  Follow Up Recommendations  CIR    Equipment Recommendations  Other (comment) (defer to next venue)    Recommendations for Other Services      Precautions / Restrictions Precautions Precautions: Other (comment) Precaution Comments: ECMO, cortrak, vent via trach, condom cath, flexiseal Restrictions Weight Bearing Restrictions: No       Mobility Bed Mobility               General bed mobility comments: Continued: Total A +2 for reposition in bed. Use of kreg tilt bed for semi standing. +2 to manage line and safety.  Transfers                 General transfer comment: Continued to utilized tilt bed to come to semi-standing position    Balance Overall balance assessment:  Needs assistance         Standing balance support: No upper extremity supported;During functional activity Standing balance-Leahy Scale: Poor Standing balance comment: tilted to 45 degrees x 20 min, 50 degrees x 5 min, pt without truncal control                           ADL either performed or assessed with clinical judgement   ADL                                         General ADL Comments: total assist     Vision       Perception     Praxis      Cognition Arousal/Alertness: Lethargic Behavior During Therapy: Flat affect Overall Cognitive Status: Difficult to assess                                 General Comments: not following commands, opened eyes at end of session once back in supine, but did not respond to threat        Exercises Exercises: General Upper Extremity General Exercises - Upper Extremity Shoulder Flexion: PROM;Both;10 reps;Standing Shoulder ABduction: PROM;Both;Supine;10 reps (tilt bed at 45*) Shoulder ADduction: PROM;Both;Supine;10 reps (tilt bed at 45*) Elbow Flexion: PROM;Both;10 reps;Standing Elbow Extension: PROM;Both;10 reps;Standing Wrist  Flexion: PROM;Both;5 reps;Standing Wrist Extension: PROM;Both;Standing;5 reps Digit Composite Flexion: PROM;Both;5 reps;Standing Composite Extension: PROM;Both;5 reps;Standing    Shoulder Instructions       General Comments RN and ECMO specialist present during session, pt tolerated 45 degrees for 20 minutes and 50 degrees for 5 minutes.    Pertinent Vitals/ Pain       Pain Assessment: Faces Faces Pain Scale: No hurt Pain Location: no change in response to nail bed pressure or in elevated position Pain Descriptors / Indicators: Grimacing Pain Intervention(s): Monitored during session;Repositioned  Home Living                                          Prior Functioning/Environment              Frequency  Min 2X/week         Progress Toward Goals  OT Goals(current goals can now be found in the care plan section)  Progress towards OT goals: Not progressing toward goals - comment (lethargy)  Acute Rehab OT Goals Patient Stated Goal: unable OT Goal Formulation: Patient unable to participate in goal setting Time For Goal Achievement: 09/26/20 Potential to Achieve Goals: Fair  Plan Discharge plan remains appropriate    Co-evaluation    PT/OT/SLP Co-Evaluation/Treatment: Yes Reason for Co-Treatment: Complexity of the patient's impairments (multi-system involvement)   OT goals addressed during session: Strengthening/ROM      AM-PAC OT "6 Clicks" Daily Activity     Outcome Measure   Help from another person eating meals?: Total Help from another person taking care of personal grooming?: Total Help from another person toileting, which includes using toliet, bedpan, or urinal?: Total Help from another person bathing (including washing, rinsing, drying)?: Total Help from another person to put on and taking off regular upper body clothing?: Total Help from another person to put on and taking off regular lower body clothing?: Total 6 Click Score: 6    End of Session    OT Visit Diagnosis: Unsteadiness on feet (R26.81);Other abnormalities of gait and mobility (R26.89);Muscle weakness (generalized) (M62.81);Pain   Activity Tolerance Patient tolerated treatment well (RR to 57 at times)   Patient Left in bed;with call bell/phone within reach;with nursing/sitter in room   Nurse Communication Other (comment) (RN managed ECMO, participated in session)        Time: 1351-1430 OT Time Calculation (min): 39 min  Charges: OT General Charges $OT Visit: 1 Visit OT Treatments $Therapeutic Activity: 8-22 mins  Martie Round, OTR/L Acute Rehabilitation Services Pager: [phone] Office: [phone]   Donald Rowe 09/22/2020, 3:54 PM

## 2020-09-22 NOTE — Progress Notes (Signed)
NAME:  Donald Rowe, MRN:  [id number], DOB:  Feb 06, 1961, LOS: 59 ADMISSION DATE:  09/15/2020, CONSULTATION DATE:  12/6 REFERRING MD:  Dr. Aileen Fass, CHIEF COMPLAINT:  SOB    Brief History   59 y/o M admitted 12/4 with 1 week hx of SOB, known COVID positive.  He is unvaccinated.  CTA chest negative for PE but demonstrated diffuse bilateral infiltrates. [place] started on 12/8   Past Medical History  Hypothyroidism  [facility] Events/Procedures  12/04 Admit  12/06 PCCM consulted  12/07 Intubated, central line, a line 12/08 VV ECMO Cannulaton, cortrak 12/13 circuit change for hemolytic anemia 12/16 Tracheostomy  12/17 bronch w/ BAL  Consults:  PCCM, Heart failure, TCTS, ECMO  Significant Diagnostic Tests:   CTA Chest 12/4 >> extensive bilateral airspace disease, no large PE identified, limited study   LE Venous Duplex 12/4 >> negative for DVT bilaterally   LE venous duplex 12/8> BLE DVTs  RUE venous duplex 12/23  RUE arterial duplex 12/23  Micro Data:  COVID 12/4 >> negative  Influenza A/B 12/4 >> negative  MRSA PCR 12/5 >> negative  BCx2 12/4 >> NG Trach aspirate 12/12> rare MRSA BAL 12/17> MRSA, candida Aspergillus Ag BAL 12/17> 0.05 12/18 sars2: positive  Antimicrobials:  Azithromycin 12/5 >> 12/6  Ceftriaxone 12/5 >> 12/6  Cefepime 12/7>12/14 vanc 12/14>12/20 Meropenem 12/17>12/21  Interim history/subjective:  Blood pressure remained labile on metoprolol, hydralazine and clonidine  Currently on Precedex and fentanyl infusion  Right upper extremity ultrasound showed no DVT  Objective   Blood pressure (!) 163/67, pulse 88, temperature 98.42 F (36.9 C), resp. rate 17, height _0  (1.753 m), weight 109.2 kg, SpO2 96 %.    Vent Mode: PCV FiO2 (%):  [40 %] 40 % Set Rate:  [12 bmp] 12 bmp PEEP:  [10 cmH20] 10 cmH20 Plateau Pressure:  [17 cmH20-24 cmH20] 24 cmH20   Intake/Output Summary (Last 24 hours) at 09/22/2020 0746 Last data  filed at 09/22/2020 0600 Gross per 24 hour  Intake 4801.28 ml  Output 6175 ml  Net -1373.72 ml   Filed Weights   09/20/20 0424 09/21/20 0600 09/22/20 0600  Weight: 114.6 kg 109.1 kg 109.2 kg    Examination: Gen: Well-developed, acutely ill-appearing obese male, lying on the bed  HEENT: NCAT, perrla, s/p trach Neck: supple, ROM intact, ECMO lines intact and functioning CV: Tachycardic, regular rhythm Pulm: Distant breath sounds, no obvious wheeze or rales Abd: Soft, BS+, NTND Extm: Peripheral pulses intact 2+ radial pulse on RUE, diffuse edema Skin: Dry, Warm, small areas of ecchymois throughout B/L fingers with some mottling in 1-2 digits R>>>L Neuro: Eyes closed, does not open  Resolved Hospital Problem list      Assessment & Plan:   Acute hypoxemic and hypercarbic respiratory failure secondary to COVID PNA with severe ARDS. S/p VV ECMO cannulation 09/10/2020.  Tracheostomy 41/93 complicated by trach site bleeding CAP with MRSA Completed remdesivir.  Completed baricitinib.  Completed CAP coverage. Completed antibiotic vancomycin and meropenem, leukocytosis trending down Appreciate heart failure recs c/w full ECMO support; bival for AC Solu-Medrol was decreased to once daily yesterday, will continue with slow taper Continue routine trach care VAP prevention protocol Completed for treatment sessions for Plex Continue diuresis with Lasix, decreased to 40 mg once daily pneumomediastinum noted on cxr no ptx appreciated Sweep on ECMO weaned down to 4, continue to titrate down  Agitated delirium requiring titration of IV sedation Continue Precedex and fentanyl infusion Minimize sedation with RASS  goal 1 and tolerance of MV & ECMO Con't seroquel Con't enteral oxycodone and clonazepam  Bilateral lower ext DVTs- provoked by COVID infection On bivaliriduin; goal PTT 50-55 currently Right upper extremity duplex is negative We will change PICC line to other arm  Steroid-induced  hyperglycemia Fingersticks are better controlled now, at goal Continue Levemir 25 units Continue TF coverage (but decreasing) and SSI PRN  Hypothyroidism con't PTA synthroid   Hemolytic Anemia Throbocytopenia Hyperbilirubinemia Rheumatologic-associated MAHA remains in differential.  hgb and plt are stable Completed treatment with the Plex for 4 sessions Transfuse if hgb <8  Hypernatremia and hyperchloremia Azotemia Increase free water to 400 every 4 strict I/Os  Plan discussed with Palliative Care, Cardiology, ECMO specialist, RN, pharmacy at beside.    Best practice (evaluated daily)  Diet: TF Pain/Anxiety/Delirium protocol (if indicated): Precedex and fentanyl VAP protocol (if indicated): yes DVT prophylaxis: Bivalirudin GI prophylaxis: pantoprazole  BID Glucose control: basal-bolus Mobility: bedrest Last date of multidisciplinary goals of care discussion: wife updated over the phone 12/23 Summary of discussion con't aggressive care Follow up goals of care discussion due: 12/30 Code Status: full Disposition: ICU   Total critical care time: 43 minutes   Critical care time was exclusive of separately billable procedures and treating other patients.   Critical care was necessary to treat or prevent imminent or life-threatening deterioration.   Critical care was time spent personally by me on the following activities: development of treatment plan with patient and/or surrogate as well as nursing, discussions with consultants, evaluation of patient's response to treatment, examination of patient, obtaining history from patient or surrogate, ordering and performing treatments and interventions, ordering and review of laboratory studies, ordering and review of radiographic studies, pulse oximetry and re-evaluation of patient's condition.   Jacky Kindle MD [facility] Pager: [phone] Mobile: [phone]

## 2020-09-22 NOTE — Progress Notes (Signed)
Patient ID: Donald Rowe, male   DOB: 03-11-1961, 59 y.o.   MRN: [id number]   S: Diuresed 1 liter, lasix decreased per cards- sodium pretty stable.  K is down  O:BP (!) 163/67   Pulse 88   Temp 98.42 F (36.9 C)   Resp 17   Ht $R'5\' 9"'vU$  (1.753 m)   Wt 109.2 kg   SpO2 96%   BMI 35.55 kg/m   Intake/Output Summary (Last 24 hours) at 09/22/2020 0759 Last data filed at 09/22/2020 0600 Gross per 24 hour  Intake 4801.28 ml  Output 6175 ml  Net -1373.72 ml   Intake/Output: I/O last 3 completed shifts: In: 8139.7 [I.V.:2689.7; NG/GT:5450] Out: 9625 [Urine:9275; Stool:350]  Intake/Output this shift:  No intake/output data recorded. Weight change: 0.1 kg Gen: on vent via trach - unresponsive on propofol CVS: tachy at 108 Resp: distant BS bilaterally Abd: obese, +BS, soft femoral temp HD cath Ext: 1-2+ edema  Recent Labs  Lab 09/16/20 0357 09/16/20 0405 09/17/20 0454 09/17/20 0503 09/18/20 0312 09/18/20 0434 09/19/20 0412 09/19/20 0418 09/19/20 1533 09/19/20 1539 09/20/20 0322 09/20/20 0828 09/20/20 1550 09/20/20 1554 09/21/20 0252 09/21/20 0411 09/21/20 1219 09/21/20 1522 09/21/20 2016 09/22/20 0335 09/22/20 0345  NA 148*   < > 148*   < > 148*   < > 150*   < > 149*   < > 149*  153*   < > 149*   < > 149* 150* 150* 150* 149* 150* 150*  K 5.0   < > 4.9   < > 5.2*   < > 4.9   < > 5.0   < > 4.9  4.9   < > 5.2*   < > 5.4* 5.4* 4.6 5.1 5.0 4.8 4.8  CL 118*   < > 121*   < > 119*   < > 118*  --  118*  --  117*  --  114*  --   --  115*  --  115*  --  113*  --   CO2 22   < > 21*   < > 23   < > 25  --  23  --  25  --  27  --   --  28  --  30  --  30  --   GLUCOSE 215*   < > 180*   < > 190*   < > 160*  --  166*  --  141*  --  151*  --   --  178*  --  159*  --  154*  --   BUN 84*   < > 87*   < > 95*   < > 93*  --  87*  --  94*  --  91*  --   --  95*  --  98*  --  91*  --   CREATININE 1.35*   < > 1.30*   < > 1.35*   < > 1.34*  --  1.28*  --  1.28*  --  1.21  --   --  1.27*  --   1.18  --  1.15  --   ALBUMIN 3.0*  --  3.3*  --  2.8*  --  2.8*  --   --   --  3.5  --   --   --   --  3.2*  --   --   --  2.9*  --   CALCIUM 7.8*   < > 7.7*   < >  7.8*   < > 8.4*  --  7.9*  --  8.2*  --  8.4*  --   --  8.4*  --  8.0*  --  8.4*  --   AST 31  --  40  --  39  --  133*  --   --   --  58*  --   --   --   --  51*  --   --   --  49*  --   ALT 27  --  39  --  50*  --  159*  --   --   --  77*  --   --   --   --  68*  --   --   --  62*  --    < > = values in this interval not displayed.   Liver Function Tests: Recent Labs  Lab 09/20/20 0322 09/21/20 0411 09/22/20 0335  AST 58* 51* 49*  ALT 77* 68* 62*  ALKPHOS 66 81 100  BILITOT 2.9* 1.7* 1.9*  PROT 4.3* 4.8* 4.7*  ALBUMIN 3.5 3.2* 2.9*   No results for input(s): LIPASE, AMYLASE in the last 168 hours. No results for input(s): AMMONIA in the last 168 hours. CBC: Recent Labs  Lab 09/20/20 0322 09/20/20 0828 09/20/20 1550 09/20/20 1554 09/21/20 0411 09/21/20 1219 09/21/20 1522 09/21/20 2016 09/22/20 0335 09/22/20 0345  WBC 17.9*  --  18.7*  --  17.3*  --  13.0*  --  13.0*  --   HGB 8.7*  8.2*   < > 10.3*   < > 9.8*   < > 8.2* 8.8* 8.3* 8.2*  HCT 26.0*  24.0*   < > 31.2*   < > 29.7*   < > 26.5* 26.0* 27.4* 24.0*  MCV 97.4  --  92.3  --  95.2  --  96.0  --  97.5  --   PLT 146*  --  150  --  144*  --  141*  --  154  --    < > = values in this interval not displayed.   Cardiac Enzymes: No results for input(s): CKTOTAL, CKMB, CKMBINDEX, TROPONINI in the last 168 hours. CBG: Recent Labs  Lab 09/21/20 1151 09/21/20 1524 09/21/20 2016 09/21/20 2348 09/22/20 0343  GLUCAP 102* 100* 122* 92 138*    Iron Studies: No results for input(s): IRON, TIBC, TRANSFERRIN, FERRITIN in the last 72 hours. Studies/Results: DG CHEST PORT 1 VIEW  Result Date: 09/22/2020 CLINICAL DATA:  COVID positive.  ECMO. EXAM: PORTABLE CHEST 1 VIEW COMPARISON:  09/21/2020 FINDINGS: 0540 hours. Tracheostomy and feeding tubes again noted.  ECMO cannula appears stable given differential positioning. Right PICC line tip is obscured by the ECMO cannula. Diffuse bilateral airspace disease is similar. Persistent pneumomediastinum. Telemetry leads overlie the chest. IMPRESSION: No substantial interval change in exam. Diffuse bilateral airspace disease with pneumomediastinum. Electronically Signed   By: Misty Stanley M.D.   On: 09/22/2020 06:39   DG CHEST PORT 1 VIEW  Result Date: 09/21/2020 CLINICAL DATA:  Hypoxia EXAM: PORTABLE CHEST 1 VIEW COMPARISON:  September 20, 2020 FINDINGS: Tracheostomy catheter tip is 6.1 cm above the carina. ECMO catheter tip is in the inferior vena cava. Enteric tube tip is below the diaphragm. No pneumothorax. Pneumomediastinum, however, is present, similar in appearance to 1 day prior. Subcutaneous air noted on the left. Airspace opacity throughout the lungs bilaterally is stable with small pleural effusions bilaterally. Heart is mildly  enlarged with pulmonary vascularity normal. No adenopathy. No bone lesions. IMPRESSION: Tube and catheter positions as described without pneumothorax. Pneumomediastinum again noted, similar to 1 day prior. Subcutaneous air now present on the left. Widespread airspace opacity bilaterally without appreciable change. Stable cardiac prominence. Electronically Signed   By: Lowella Grip III M.D.   On: 09/21/2020 07:54   VAS [place] UPPER EXTREMITY VENOUS DUPLEX  Result Date: 09/21/2020 UPPER VENOUS STUDY  Indications: Swelling, and Line in right arm Limitations: Line, bandages and trach collar. Comparison Study: No prior study Performing Technologist: Sharion Dove RVS  Examination Guidelines: A complete evaluation includes B-mode imaging, spectral Doppler, color Doppler, and power Doppler as needed of all accessible portions of each vessel. Bilateral testing is considered an integral part of a complete examination. Limited examinations for reoccurring indications may be performed as noted.   Right Findings: +----------+------------+---------+-----------+----------+---------------------+ RIGHT     CompressiblePhasicitySpontaneousProperties       Summary        +----------+------------+---------+-----------+----------+---------------------+ IJV                                                    Not visualized     +----------+------------+---------+-----------+----------+---------------------+ Subclavian    Full       Yes       Yes                                    +----------+------------+---------+-----------+----------+---------------------+ Axillary      Full       Yes       Yes                                    +----------+------------+---------+-----------+----------+---------------------+ Brachial      Full       Yes       Yes              not all segment well                                                           visualized       +----------+------------+---------+-----------+----------+---------------------+ Radial        Full                                                        +----------+------------+---------+-----------+----------+---------------------+ Ulnar                                                patent by color and  Doppler        +----------+------------+---------+-----------+----------+---------------------+ Cephalic      Full                                                        +----------+------------+---------+-----------+----------+---------------------+ Basilic       Full                                                        +----------+------------+---------+-----------+----------+---------------------+  Left Findings: +----------+------------+---------+-----------+----------+-------+ LEFT      CompressiblePhasicitySpontaneousPropertiesSummary +----------+------------+---------+-----------+----------+-------+ Subclavian     Full       Yes       Yes                      +----------+------------+---------+-----------+----------+-------+  Summary:  Right: No evidence of deep vein thrombosis in the upper extremity. No evidence of superficial vein thrombosis in the upper extremity.  Left: No evidence of thrombosis in the subclavian.  *See table(s) above for measurements and observations.  Diagnosing physician: Monica Martinez MD Electronically signed by Monica Martinez MD on 09/21/2020 at 7:56:36 PM.    Final    . sodium chloride   Intravenous Once  . amLODipine  10 mg Per Tube QHS  . aspirin  81 mg Per Tube Daily  . calcium gluconate  2 g Intravenous Once  . chlorhexidine gluconate (MEDLINE KIT)  15 mL Mouth Rinse BID  . Chlorhexidine Gluconate Cloth  6 each Topical Daily  . clonazePAM  2 mg Per Tube BID  . cloNIDine  0.2 mg Per Tube TID  . docusate  100 mg Per Tube BID  . feeding supplement (PROSource TF)  45 mL Per Tube TID  . fiber  1 packet Per Tube BID  . free water  300 mL Per Tube Q4H  . furosemide  40 mg Intravenous Daily  . hydrALAZINE  25 mg Per Tube TID AC  . insulin aspart  2-6 Units Subcutaneous Q4H  . insulin aspart  5 Units Subcutaneous Q4H  . insulin detemir  25 Units Subcutaneous Q12H  . levothyroxine  50 mcg Per Tube Q0600  . mouth rinse  15 mL Mouth Rinse 10 times per day  . methylPREDNISolone (SOLU-MEDROL) injection  40 mg Intravenous Q24H  . metoprolol tartrate  50 mg Per Tube TID PC  . oxyCODONE  15 mg Per Tube Q6H  . pantoprazole sodium  40 mg Per Tube BID  . pravastatin  40 mg Per Tube q1800  . QUEtiapine  100 mg Per Tube BID  . sodium chloride flush  10-40 mL Intracatheter Q12H    BMET    Component Value Date/Time   NA 150 (H) 09/22/2020 0345   K 4.8 09/22/2020 0345   CL 113 (H) 09/22/2020 0335   CO2 30 09/22/2020 0335   GLUCOSE 154 (H) 09/22/2020 0335   BUN 91 (H) 09/22/2020 0335   CREATININE 1.15 09/22/2020 0335   CALCIUM 8.4 (L) 09/22/2020 0335   GFRNONAA >60  09/22/2020 0335   CBC    Component Value Date/Time   WBC 13.0 (H) 09/22/2020 0335   RBC 2.81 (L) 09/22/2020 0335   HGB  8.2 (L) 09/22/2020 0345   HCT 24.0 (L) 09/22/2020 0345   PLT 154 09/22/2020 0335   MCV 97.5 09/22/2020 0335   MCH 29.5 09/22/2020 0335   MCHC 30.3 09/22/2020 0335   RDW 20.6 (H) 09/22/2020 0335   LYMPHSABS 0.4 (L) 09/07/2020 0438   MONOABS 0.5 09/07/2020 0438   EOSABS 0.0 09/07/2020 0438   BASOSABS 0.0 09/07/2020 0438    Assessment/Plan:  1. AKI- presumably due to ischemic ATN associted with covid-19 infection. Scr stable and good - remains non-oliguric- has not required any RRT. Azotemia due to corticosteroids, tube feeds, diuretics, and hemolysis 2. 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  LDH stable -  Probably wont completely normalize on ECMO  1. Right femoral HD catheter placed 09/13/20 by PCCM- 9 days. 3. Acute hypoxic respiratory failure- secondary to covid-19 infection on VV ECMO  1. S/p trach 09/14/20 4. Hypernatremia- improved /stable with  free water volume increased today - I agree 5. 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 HCAP/MRSA pneumonitis/tracheitis- on IV vancomycin. 7. HTN/volume-  On norvasc/clonidine/lasix/metoprolol and prn hydralazine- BP overall high, steroids not helping 8. Hyperkalemia-  lokelma started, is great today-  Again steroids not helping  9. Poor prognosis   Fairly stable from a renal standpoint, renal will sign off-  Feel free to remove femoral vascath if not needed for access    [place] [phone]

## 2020-09-22 NOTE — Procedures (Signed)
Extracorporeal support note     ECLS cannulation: 09/26/2020 Last circuit change: 09/11/2020 Indication: COVD ARDS   Configuration: VV, RIJ 32Fr [place]   Pump speed: 3350 Pump flow: 4.5 Pump used: Cardiohelp   Sweep gas: 100%, 4LPM   Circuit check: 1 small clot in the corner Anticoagulant: bivalirudin Anticoagulation targets: PTT 50-70   Changes in support: Completed course of [place], continue current support.  Wean as tolerated   Anticipated goals/duration of support: bridge to recovery  Discussed care with ECMO team  Cheri Fowler MD [facility] Pager: [phone] Mobile: [phone]

## 2020-09-22 NOTE — Progress Notes (Addendum)
RT Note-Patient very dysynchronized on ventilator post PICC placement. Sedation given, RR's leveled back out.

## 2020-09-22 NOTE — Progress Notes (Signed)
ANTICOAGULATION CONSULT NOTE  Pharmacy Consult for bivalirudin Indication: ECMO + bilateral DVTs   Recent Labs    09/20/20 0322 09/20/20 0828 09/21/20 0411 09/21/20 1219 09/21/20 1522 09/21/20 2016 09/22/20 0335 09/22/20 0345  HGB 8.7*  8.2*   < > 9.8*   < > 8.2* 8.8* 8.3* 8.2*  HCT 26.0*  24.0*   < > 29.7*   < > 26.5* 26.0* 27.4* 24.0*  PLT 146*   < > 144*  --  141*  --  154  --   APTT 59*   < > 52*  --  57*  --  56*  --   LABPROT 28.2*  --  20.5*  --   --   --  19.1*  --   INR 2.8*  --  1.8*  --   --   --  1.7*  --   CREATININE 1.28*   < > 1.27*  --  1.18  --  1.15  --    < > = values in this interval not displayed.    Estimated Creatinine Clearance: 84.2 mL/min (by C-G formula based on SCr of 1.15 mg/dL).   Assessment: 50 yom unvaccinated presenting with severe COVID ARDs, intubated on 12/6, continued to have ventilation issues despite NMB and proning - started on VV ECMO on 12/8.  D-dimer >20 > now improved 14, found to have bilateral DVTs.  Given significant bleeding issues from trach, targeting lower aPTT goal of 50-55 seconds after discussion with ECMO team; but bleeding now improving, so changed goal to 50-60 seconds.   aPTT this am is therapeutic at 56 seconds on bivalirudin drip 0.12mg /kg/hr.  Hgb down 8.2 but relatively stable with no bleeding, plt 140-150 stable. Fibrinogen stable 200-300s,, LDH stable in 500s. s/p PRBCs 12/20 and 22.  Goal of Therapy:  aPTT 50-60 seconds Monitor platelets by anticoagulation protocol: Yes   Plan:  Continue IV bivalirudin to 0.12 mg/kg/hr. Check q12h aPTT Follow bleeding, CBC   Leota Sauers Pharm.D. CPP, BCPS Clinical Pharmacist [phone] 09/22/2020 8:10 AM    Please check AMION for all [facility] Pharmacy phone numbers After 10:00 PM, call Main Pharmacy [phone]

## 2020-09-22 NOTE — Progress Notes (Signed)
Patient ID: Donald Rowe, male   DOB: 03/02/1961, 59 y.o.   MRN: [id number]    Advanced Heart Failure Rounding Note   Subjective:    12/8 Cannulated for VV ECMO - 32 FR  RIJ [place] 12/9 Extubated/Reintubated for altered mental status 12/13 Circuit change for elevated LDH 12/15 PLEX started 12/16 Tracheostomy 12/23 head CT (normal) for change in mental status.   BP meds adjusted yesterday. BP remains very labile. Sweep remains at 4. Now on Precedex. Remains agitated. RUE u/s was ok..  Weight stable.    Had PLEX 4/4 12/22 LDH 434 -> 544 -> 419 -> 551.   ECMO   Speed 3350 Flow 4.5L Sweep 4 dP 21 pVen  -74  ABG: 7.35/58/82/95% Hgb 8.3 LDH 434 -> 544 -> 419 -> 551 -> 579 Lactic acid 0.8 PTT 56  Vent 40% TVs 500-600s   Objective:   Weight Range:  Vital Signs:   Temp:  [97.9 F (36.6 C)-98.6 F (37 C)] 98.42 F (36.9 C) (12/24 0700) Pulse Rate:  [85-139] 88 (12/24 0700) Resp:  [16-39] 17 (12/24 0700) BP: (84-186)/(55-67) 163/67 (12/23 1128) SpO2:  [94 %-99 %] 96 % (12/24 0700) Arterial Line BP: (98-207)/(43-74) 207/74 (12/24 0600) FiO2 (%):  [40 %] 40 % (12/24 0326) Weight:  [109.2 kg] 109.2 kg (12/24 0600) Last BM Date: 09/21/20  Weight change: Filed Weights   09/20/20 0424 09/21/20 0600 09/22/20 0600  Weight: 114.6 kg 109.1 kg 109.2 kg    Intake/Output:   Intake/Output Summary (Last 24 hours) at 09/22/2020 0753 Last data filed at 09/22/2020 0600 Gross per 24 hour  Intake 4801.28 ml  Output 6175 ml  Net -1373.72 ml     Physical Exam: General:  Sedated on vent HEENT: normal Neck: + RIJ ecmo cannula  + trach  Cor: PMI nondisplaced. Regular rate & rhythm.   Lungs: coarse Abdomen: obese  soft, nontender, nondistended. No hepatosplenomegaly. No bruits or masses. Good bowel sounds. Extremities: no cyanosis, clubbing, rash, 1+ edema + compression socks RUE skin irritation/breakdown  Neuro:awake and agitated     Telemetry: sinus 85-110  Personally reviewed    Labs: Basic Metabolic Panel: Recent Labs  Lab 09/20/20 0322 09/20/20 0828 09/20/20 1550 09/20/20 1554 09/21/20 0411 09/21/20 1219 09/21/20 1522 09/21/20 2016 09/22/20 0335 09/22/20 0345  NA 149*   153*   < > 149*   < > 150* 150* 150* 149* 150* 150*  K 4.9   4.9   < > 5.2*   < > 5.4* 4.6 5.1 5.0 4.8 4.8  CL 117*  --  114*  --  115*  --  115*  --  113*  --   CO2 25  --  27  --  28  --  30  --  30  --   GLUCOSE 141*  --  151*  --  178*  --  159*  --  154*  --   BUN 94*  --  91*  --  95*  --  98*  --  91*  --   CREATININE 1.28*  --  1.21  --  1.27*  --  1.18  --  1.15  --   CALCIUM 8.2*  --  8.4*  --  8.4*  --  8.0*  --  8.4*  --    < > = values in this interval not displayed.    Liver Function Tests: Recent Labs  Lab 09/18/20 0312 09/19/20 0412 09/20/20 0322 09/21/20 0411 09/22/20 0335  AST  39 133* 58* 51* 49*  ALT 50* 159* 77* 68* 62*  ALKPHOS 87 110 66 81 100  BILITOT 2.7* 5.0* 2.9* 1.7* 1.9*  PROT 3.9* 4.2* 4.3* 4.8* 4.7*  ALBUMIN 2.8* 2.8* 3.5 3.2* 2.9*   No results for input(s): LIPASE, AMYLASE in the last 168 hours. No results for input(s): AMMONIA in the last 168 hours.  CBC: Recent Labs  Lab 09/20/20 0322 09/20/20 0828 09/20/20 1550 09/20/20 1554 09/21/20 0411 09/21/20 1219 09/21/20 1522 09/21/20 2016 09/22/20 0335 09/22/20 0345  WBC 17.9*  --  18.7*  --  17.3*  --  13.0*  --  13.0*  --   HGB 8.7*   8.2*   < > 10.3*   < > 9.8* 9.2* 8.2* 8.8* 8.3* 8.2*  HCT 26.0*   24.0*   < > 31.2*   < > 29.7* 27.0* 26.5* 26.0* 27.4* 24.0*  MCV 97.4  --  92.3  --  95.2  --  96.0  --  97.5  --   PLT 146*  --  150  --  144*  --  141*  --  154  --    < > = values in this interval not displayed.    Cardiac Enzymes: No results for input(s): CKTOTAL, CKMB, CKMBINDEX, TROPONINI in the last 168 hours.  BNP: BNP (last 3 results) No results for input(s): BNP in the last 8760 hours.  ProBNP (last 3 results) No results for input(s): PROBNP in  the last 8760 hours.    Other results:  Imaging: DG CHEST PORT 1 VIEW  Result Date: 09/22/2020 CLINICAL DATA:  COVID positive.  ECMO. EXAM: PORTABLE CHEST 1 VIEW COMPARISON:  09/21/2020 FINDINGS: 0540 hours. Tracheostomy and feeding tubes again noted. ECMO cannula appears stable given differential positioning. Right PICC line tip is obscured by the ECMO cannula. Diffuse bilateral airspace disease is similar. Persistent pneumomediastinum. Telemetry leads overlie the chest. IMPRESSION: No substantial interval change in exam. Diffuse bilateral airspace disease with pneumomediastinum. Electronically Signed   By: Kennith Center M.D.   On: 09/22/2020 06:39   DG CHEST PORT 1 VIEW  Result Date: 09/21/2020 CLINICAL DATA:  Hypoxia EXAM: PORTABLE CHEST 1 VIEW COMPARISON:  September 20, 2020 FINDINGS: Tracheostomy catheter tip is 6.1 cm above the carina. ECMO catheter tip is in the inferior vena cava. Enteric tube tip is below the diaphragm. No pneumothorax. Pneumomediastinum, however, is present, similar in appearance to 1 day prior. Subcutaneous air noted on the left. Airspace opacity throughout the lungs bilaterally is stable with small pleural effusions bilaterally. Heart is mildly enlarged with pulmonary vascularity normal. No adenopathy. No bone lesions. IMPRESSION: Tube and catheter positions as described without pneumothorax. Pneumomediastinum again noted, similar to 1 day prior. Subcutaneous air now present on the left. Widespread airspace opacity bilaterally without appreciable change. Stable cardiac prominence. Electronically Signed   By: Bretta Bang III M.D.   On: 09/21/2020 07:54   VAS [place] UPPER EXTREMITY VENOUS DUPLEX  Result Date: 09/21/2020 UPPER VENOUS STUDY  Indications: Swelling, and Line in right arm Limitations: Line, bandages and trach collar. Comparison Study: No prior study Performing Technologist: Sherren Kerns RVS  Examination Guidelines: A complete evaluation includes B-mode  imaging, spectral Doppler, color Doppler, and power Doppler as needed of all accessible portions of each vessel. Bilateral testing is considered an integral part of a complete examination. Limited examinations for reoccurring indications may be performed as noted.  Right Findings: +----------+------------+---------+-----------+----------+---------------------+  RIGHT      Compressible Phasicity Spontaneous  Properties        Summary         +----------+------------+---------+-----------+----------+---------------------+  IJV                                                         Not visualized      +----------+------------+---------+-----------+----------+---------------------+  Subclavian     Full        Yes        Yes                                       +----------+------------+---------+-----------+----------+---------------------+  Axillary       Full        Yes        Yes                                       +----------+------------+---------+-----------+----------+---------------------+  Brachial       Full        Yes        Yes                not all segment well                                                                  visualized        +----------+------------+---------+-----------+----------+---------------------+  Radial         Full                                                             +----------+------------+---------+-----------+----------+---------------------+  Ulnar                                                     patent by color and                                                                    Doppler         +----------+------------+---------+-----------+----------+---------------------+  Cephalic       Full                                                             +----------+------------+---------+-----------+----------+---------------------+  Basilic        Full                                                              +----------+------------+---------+-----------+----------+---------------------+  Left Findings: +----------+------------+---------+-----------+----------+-------+  LEFT       Compressible Phasicity Spontaneous Properties Summary  +----------+------------+---------+-----------+----------+-------+  Subclavian     Full        Yes        Yes                         +----------+------------+---------+-----------+----------+-------+  Summary:  Right: No evidence of deep vein thrombosis in the upper extremity. No evidence of superficial vein thrombosis in the upper extremity.  Left: No evidence of thrombosis in the subclavian.  *See table(s) above for measurements and observations.  Diagnosing physician: Monica Martinez MD Electronically signed by Monica Martinez MD on 09/21/2020 at 7:56:36 PM.    Final      Medications:     Scheduled Medications:  sodium chloride   Intravenous Once   amLODipine  10 mg Per Tube QHS   aspirin  81 mg Per Tube Daily   calcium gluconate  2 g Intravenous Once   chlorhexidine gluconate (MEDLINE KIT)  15 mL Mouth Rinse BID   Chlorhexidine Gluconate Cloth  6 each Topical Daily   clonazePAM  2 mg Per Tube BID   cloNIDine  0.2 mg Per Tube TID   docusate  100 mg Per Tube BID   feeding supplement (PROSource TF)  45 mL Per Tube TID   fiber  1 packet Per Tube BID   free water  300 mL Per Tube Q4H   furosemide  40 mg Intravenous Daily   hydrALAZINE  25 mg Per Tube TID AC   insulin aspart  2-6 Units Subcutaneous Q4H   insulin aspart  5 Units Subcutaneous Q4H   insulin detemir  25 Units Subcutaneous Q12H   levothyroxine  50 mcg Per Tube Q0600   mouth rinse  15 mL Mouth Rinse 10 times per day   methylPREDNISolone (SOLU-MEDROL) injection  40 mg Intravenous Q24H   metoprolol tartrate  50 mg Per Tube TID PC   oxyCODONE  15 mg Per Tube Q6H   pantoprazole sodium  40 mg Per Tube BID   pravastatin  40 mg Per Tube q1800   QUEtiapine  100 mg Per Tube BID    sodium chloride flush  10-40 mL Intracatheter Q12H    Infusions:  sodium chloride 250 mL (09/21/20 2213)   sodium chloride 10 mL/hr at 09/20/20 1900   albumin human     albumin human Stopped (09/19/20 1804)   bivalirudin (ANGIOMAX) infusion 0.5 mg/mL (Non-ACS indications) 0.12 mg/kg/hr (09/22/20 0121)   citrate dextrose     dexmedetomidine (PRECEDEX) IV infusion 1.2 mcg/kg/hr (09/22/20 0609)   dextrose     feeding supplement (PIVOT 1.5 CAL) 1,000 mL (09/21/20 2028)   fentaNYL infusion INTRAVENOUS 100 mcg/hr (09/22/20 0257)   norepinephrine (LEVOPHED) Adult infusion      PRN Medications: sodium chloride, acetaminophen (TYLENOL) oral liquid 160 mg/5 mL, acetaminophen, albumin human, albuterol, dextrose, diphenhydrAMINE, diphenhydrAMINE, fentaNYL, Gerhardt's butt cream, heparin, labetalol, lip balm, LORazepam, ondansetron **OR** ondansetron (ZOFRAN) IV, phenylephrine, polyethylene glycol, sodium chloride flush  Assessment/Plan:   1. Acute hypoxic respiratory failure/ARDS in setting of COVID-19 PNA - Cannulated for VV ECMO on 12/8 after failing intubation x 2 days - has complete remedisivir, baricitinib, Solumedrol.  - s/p trach 12/16 - has diuresed very briskly. Weight stable about 10 pounds up from baseline. Creatinine stable. Continue IV lasix at 40 daily - CXR stable to slightly better this am.  Sweep down to 4 Oxygenation improved. Attempt to wean sweep again today.  Cannula slightly high (improved from yesterday) but oxygenation ok. Can reposition as needed - evidence of worsening acidosis/infection 12/17 mero added. Mero completed 12/21 - staph in tracheal aspirate -  vanc added 12/14 mero added 12/17. vanc complete 12/20 - circuit changed 12/13 for elevated LDH and lower flows. PLEX started 12/15. S/p PLEX #4/4 on 12/21 Saturday. LDH rising slowly will follow. - PTT goal turned down to 50-60 due to bleeding. Improved. Circuit looks ok. PTT 56 today. Bleeding has  resolved. Discussed dosing with PharmD personally. - Pulling good volumes on vent. No change - Mental status remains a major issue. Now on Precedex Head CT ok.   2. Bilateral DVT - remains on bival - Dosing d/w PharmD - no change  3. DM2 - Insulin adjusted. CBGs improved   4. Obesity -Body mass index is 33.73 kg/m.  5. F/E/N - Stable 150 - Increases free water  6. HTN - Remains very labile  - Meds adjusted again . May have to tolerate some permissive hypertension (especially during periods of agitation) to avoid hypotension. Hold anti-HTN meds prior to PLEX - no change  7. RUE skin breakdown around PICC site - switch PICC to left  8. AKI - diuresing briskly. BUN/CR stable - can use PLEX line for HD if needed - lasix decreased yesterday - d/w Renal at bedside  9. Acute encephalopathy - remains agitated  - head CT ok 09/07/20 and 12/21 - switched to Precedex  Plan discussed on multi-discplinary ECMO rounds with CCM, ECMO coordinator/specialist, PharmDs and RNs.  CRITICAL CARE Performed by: Glori Bickers  CRITICAL CARE Performed by: Glori Bickers  Total critical care time: 45 minutes  Critical care time was exclusive of separately billable procedures and treating other patients.  Critical care was necessary to treat or prevent imminent or life-threatening deterioration.  Critical care was time spent personally by me (independent of midlevel providers or residents) on the following activities: development of treatment plan with patient and/or surrogate as well as nursing, discussions with consultants, evaluation of patient's response to treatment, examination of patient, obtaining history from patient or surrogate, ordering and performing treatments and interventions, ordering and review of laboratory studies, ordering and review of radiographic studies, pulse oximetry and re-evaluation of patient's condition.    Length of Stay: 20   Glori Bickers  MD 09/22/2020, 7:53 AM  Advanced Heart Failure Team Pager [phone] (M-F; [place])  Please contact [facility] for night-coverage after hours (4p -7a ) and weekends on [email or web address]

## 2020-09-22 NOTE — Progress Notes (Signed)
Peripherally Inserted Central Catheter Placement  The IV Nurse has discussed with the patient and/or persons authorized to consent for the patient, the purpose of this procedure and the potential benefits and risks involved with this procedure.  The benefits include less needle sticks, lab draws from the catheter, and the patient may be discharged home with the catheter. Risks include, but not limited to, infection, bleeding, blood clot (thrombus formation), and puncture of an artery; nerve damage and irregular heartbeat and possibility to perform a PICC exchange if needed/ordered by physician.  Alternatives to this procedure were also discussed.  Bard Power PICC patient education guide, fact sheet on infection prevention and patient information card has been provided to patient /or left at bedside.    PICC Placement Documentation  PICC Triple Lumen 09/15/20 PICC Right Basilic 46 cm (Active)  Indication for Insertion or Continuance of Line Vasoactive infusions 09/22/20 0800  Exposed Catheter (cm) 0 cm 09/15/20 1644  Site Assessment Clean;Dry;Intact 09/22/20 0800  Lumen #1 Status Saline locked 09/22/20 0800  Lumen #2 Status Saline locked 09/22/20 0800  Lumen #3 Status Saline locked 09/22/20 0800  Dressing Type Transparent;Occlusive 09/22/20 0800  Dressing Status Clean;Intact;Old drainage 09/22/20 0800  Antimicrobial disc in place? Yes 09/22/20 0800  Safety Lock Not Applicable 09/22/20 0800  Dressing Intervention New dressing;Dressing changed;Antimicrobial disc changed 09/21/20 0600  Dressing Change Due 09/28/20 09/22/20 0800     PICC Triple Lumen 09/22/20 PICC Left Basilic 48 cm 1 cm (Active)  Exposed Catheter (cm) 1 cm 09/22/20 0950  Site Assessment Clean;Dry;Intact 09/22/20 0950  Lumen #1 Status Flushed;Blood return noted;Saline locked 09/22/20 0950  Lumen #2 Status Flushed;Blood return noted;Saline locked 09/22/20 0950  Lumen #3 Status Flushed;Blood return noted 09/22/20 0950  Dressing  Type Transparent;Securing device 09/22/20 0950  Dressing Status Clean;Dry;Intact 09/22/20 0950  Antimicrobial disc in place? Yes 09/22/20 0950  Safety Lock Not Applicable 09/22/20 0950  Dressing Change Due 09/29/20 09/22/20 0950       Donald Rowe 09/22/2020, 9:53 AM

## 2020-09-23 ENCOUNTER — Inpatient Hospital Stay (HOSPITAL_COMMUNITY): Payer: HRSA Program

## 2020-09-23 ENCOUNTER — Inpatient Hospital Stay: Payer: Self-pay

## 2020-09-23 DIAGNOSIS — R0989 Other specified symptoms and signs involving the circulatory and respiratory systems: Secondary | ICD-10-CM

## 2020-09-23 DIAGNOSIS — E870 Hyperosmolality and hypernatremia: Secondary | ICD-10-CM | POA: Diagnosis not present

## 2020-09-23 DIAGNOSIS — Z7189 Other specified counseling: Secondary | ICD-10-CM | POA: Diagnosis not present

## 2020-09-23 DIAGNOSIS — U071 COVID-19: Secondary | ICD-10-CM | POA: Diagnosis not present

## 2020-09-23 DIAGNOSIS — Z515 Encounter for palliative care: Secondary | ICD-10-CM | POA: Diagnosis not present

## 2020-09-23 DIAGNOSIS — R410 Disorientation, unspecified: Secondary | ICD-10-CM

## 2020-09-23 DIAGNOSIS — J9601 Acute respiratory failure with hypoxia: Secondary | ICD-10-CM | POA: Diagnosis not present

## 2020-09-23 LAB — BASIC METABOLIC PANEL
Anion gap: 6 (ref 5–15)
Anion gap: 7 (ref 5–15)
BUN: 81 mg/dL — ABNORMAL HIGH (ref 6–20)
BUN: 85 mg/dL — ABNORMAL HIGH (ref 6–20)
CO2: 31 mmol/L (ref 22–32)
CO2: 31 mmol/L (ref 22–32)
Calcium: 8.5 mg/dL — ABNORMAL LOW (ref 8.9–10.3)
Calcium: 8.2 mg/dL — ABNORMAL LOW (ref 8.9–10.3)
Chloride: 115 mmol/L — ABNORMAL HIGH (ref 98–111)
Chloride: 111 mmol/L (ref 98–111)
Creatinine, Ser: 1 mg/dL (ref 0.61–1.24)
Creatinine, Ser: 1 mg/dL (ref 0.61–1.24)
GFR, Estimated: >60 mL/min (ref >60)
GFR, Estimated: >60 mL/min (ref >60)
Glucose, Bld: 130 mg/dL — ABNORMAL HIGH (ref 70–99)
Glucose, Bld: 144 mg/dL — ABNORMAL HIGH (ref 70–99)
Potassium: 4.9 mmol/L (ref 3.5–5.1)
Potassium: 5.5 mmol/L — ABNORMAL HIGH (ref 3.5–5.1)
Sodium: 152 mmol/L — ABNORMAL HIGH (ref 135–145)
Sodium: 149 mmol/L — ABNORMAL HIGH (ref 135–145)

## 2020-09-23 LAB — POCT I-STAT 7, (LYTES, BLD GAS, ICA,H+H)
Acid-Base Excess: 6 mmol/L — ABNORMAL HIGH (ref 0.0–2.0)
Acid-Base Excess: 6 mmol/L — ABNORMAL HIGH (ref 0.0–2.0)
Acid-Base Excess: 6 mmol/L — ABNORMAL HIGH (ref 0.0–2.0)
Acid-Base Excess: 6 mmol/L — ABNORMAL HIGH (ref 0.0–2.0)
Acid-Base Excess: 7 mmol/L — ABNORMAL HIGH (ref 0.0–2.0)
Acid-Base Excess: 7 mmol/L — ABNORMAL HIGH (ref 0.0–2.0)
Acid-Base Excess: 8 mmol/L — ABNORMAL HIGH (ref 0.0–2.0)
Bicarbonate: 32.6 mmol/L — ABNORMAL HIGH (ref 20.0–28.0)
Bicarbonate: 33 mmol/L — ABNORMAL HIGH (ref 20.0–28.0)
Bicarbonate: 33.4 mmol/L — ABNORMAL HIGH (ref 20.0–28.0)
Bicarbonate: 33.6 mmol/L — ABNORMAL HIGH (ref 20.0–28.0)
Bicarbonate: 34.2 mmol/L — ABNORMAL HIGH (ref 20.0–28.0)
Bicarbonate: 34.4 mmol/L — ABNORMAL HIGH (ref 20.0–28.0)
Bicarbonate: 34.5 mmol/L — ABNORMAL HIGH (ref 20.0–28.0)
Calcium, Ion: 1.24 mmol/L (ref 1.15–1.40)
Calcium, Ion: 1.25 mmol/L (ref 1.15–1.40)
Calcium, Ion: 1.27 mmol/L (ref 1.15–1.40)
Calcium, Ion: 1.28 mmol/L (ref 1.15–1.40)
Calcium, Ion: 1.29 mmol/L (ref 1.15–1.40)
Calcium, Ion: 1.3 mmol/L (ref 1.15–1.40)
Calcium, Ion: 1.33 mmol/L (ref 1.15–1.40)
HCT: 19 % — ABNORMAL LOW (ref 39.0–52.0)
HCT: 19 % — ABNORMAL LOW (ref 39.0–52.0)
HCT: 21 % — ABNORMAL LOW (ref 39.0–52.0)
HCT: 23 % — ABNORMAL LOW (ref 39.0–52.0)
HCT: 24 % — ABNORMAL LOW (ref 39.0–52.0)
HCT: 25 % — ABNORMAL LOW (ref 39.0–52.0)
HCT: 26 % — ABNORMAL LOW (ref 39.0–52.0)
Hemoglobin: 6.5 g/dL — CL (ref 13.0–17.0)
Hemoglobin: 6.5 g/dL — CL (ref 13.0–17.0)
Hemoglobin: 7.1 g/dL — ABNORMAL LOW (ref 13.0–17.0)
Hemoglobin: 7.8 g/dL — ABNORMAL LOW (ref 13.0–17.0)
Hemoglobin: 8.2 g/dL — ABNORMAL LOW (ref 13.0–17.0)
Hemoglobin: 8.5 g/dL — ABNORMAL LOW (ref 13.0–17.0)
Hemoglobin: 8.8 g/dL — ABNORMAL LOW (ref 13.0–17.0)
O2 Saturation: 91 %
O2 Saturation: 92 %
O2 Saturation: 92 %
O2 Saturation: 93 %
O2 Saturation: 95 %
O2 Saturation: 95 %
O2 Saturation: 97 %
Patient temperature: 36.5
Patient temperature: 36.7
Patient temperature: 36.8
Patient temperature: 36.9
Patient temperature: 36.9
Patient temperature: 36.9
Patient temperature: 37.1
Potassium: 4.8 mmol/L (ref 3.5–5.1)
Potassium: 4.9 mmol/L (ref 3.5–5.1)
Potassium: 5 mmol/L (ref 3.5–5.1)
Potassium: 5.2 mmol/L — ABNORMAL HIGH (ref 3.5–5.1)
Potassium: 5.4 mmol/L — ABNORMAL HIGH (ref 3.5–5.1)
Potassium: 5.4 mmol/L — ABNORMAL HIGH (ref 3.5–5.1)
Potassium: 5.5 mmol/L — ABNORMAL HIGH (ref 3.5–5.1)
Sodium: 150 mmol/L — ABNORMAL HIGH (ref 135–145)
Sodium: 150 mmol/L — ABNORMAL HIGH (ref 135–145)
Sodium: 150 mmol/L — ABNORMAL HIGH (ref 135–145)
Sodium: 151 mmol/L — ABNORMAL HIGH (ref 135–145)
Sodium: 151 mmol/L — ABNORMAL HIGH (ref 135–145)
Sodium: 151 mmol/L — ABNORMAL HIGH (ref 135–145)
Sodium: 151 mmol/L — ABNORMAL HIGH (ref 135–145)
TCO2: 34 mmol/L — ABNORMAL HIGH (ref 22–32)
TCO2: 35 mmol/L — ABNORMAL HIGH (ref 22–32)
TCO2: 35 mmol/L — ABNORMAL HIGH (ref 22–32)
TCO2: 35 mmol/L — ABNORMAL HIGH (ref 22–32)
TCO2: 36 mmol/L — ABNORMAL HIGH (ref 22–32)
TCO2: 36 mmol/L — ABNORMAL HIGH (ref 22–32)
TCO2: 37 mmol/L — ABNORMAL HIGH (ref 22–32)
pCO2 arterial: 58 mmHg — ABNORMAL HIGH (ref 32.0–48.0)
pCO2 arterial: 58.4 mmHg — ABNORMAL HIGH (ref 32.0–48.0)
pCO2 arterial: 62.3 mmHg — ABNORMAL HIGH (ref 32.0–48.0)
pCO2 arterial: 62.7 mmHg — ABNORMAL HIGH (ref 32.0–48.0)
pCO2 arterial: 66.6 mmHg (ref 32.0–48.0)
pCO2 arterial: 68.5 mmHg (ref 32.0–48.0)
pCO2 arterial: 73.3 mmHg (ref 32.0–48.0)
pH, Arterial: 7.28 — ABNORMAL LOW (ref 7.350–7.450)
pH, Arterial: 7.3 — ABNORMAL LOW (ref 7.350–7.450)
pH, Arterial: 7.305 — ABNORMAL LOW (ref 7.350–7.450)
pH, Arterial: 7.333 — ABNORMAL LOW (ref 7.350–7.450)
pH, Arterial: 7.35 (ref 7.350–7.450)
pH, Arterial: 7.354 (ref 7.350–7.450)
pH, Arterial: 7.37 (ref 7.350–7.450)
pO2, Arterial: 67 mmHg — ABNORMAL LOW (ref 83.0–108.0)
pO2, Arterial: 69 mmHg — ABNORMAL LOW (ref 83.0–108.0)
pO2, Arterial: 71 mmHg — ABNORMAL LOW (ref 83.0–108.0)
pO2, Arterial: 78 mmHg — ABNORMAL LOW (ref 83.0–108.0)
pO2, Arterial: 83 mmHg (ref 83.0–108.0)
pO2, Arterial: 87 mmHg (ref 83.0–108.0)
pO2, Arterial: 96 mmHg (ref 83.0–108.0)

## 2020-09-23 LAB — CBC
HCT: 27.2 % — ABNORMAL LOW (ref 39.0–52.0)
HCT: 23.8 % — ABNORMAL LOW (ref 39.0–52.0)
HCT: 28.7 % — ABNORMAL LOW (ref 39.0–52.0)
HCT: 26.6 % — ABNORMAL LOW (ref 39.0–52.0)
HCT: 22.9 % — ABNORMAL LOW (ref 39.0–52.0)
Hemoglobin: 8.4 g/dL — ABNORMAL LOW (ref 13.0–17.0)
Hemoglobin: 7.1 g/dL — ABNORMAL LOW (ref 13.0–17.0)
Hemoglobin: 8.6 g/dL — ABNORMAL LOW (ref 13.0–17.0)
Hemoglobin: 8.5 g/dL — ABNORMAL LOW (ref 13.0–17.0)
Hemoglobin: 7.4 g/dL — ABNORMAL LOW (ref 13.0–17.0)
MCH: 30 pg (ref 26.0–34.0)
MCH: 29.7 pg (ref 26.0–34.0)
MCH: 29.4 pg (ref 26.0–34.0)
MCH: 30.8 pg (ref 26.0–34.0)
MCH: 31.2 pg (ref 26.0–34.0)
MCHC: 30.9 g/dL (ref 30.0–36.0)
MCHC: 29.8 g/dL — ABNORMAL LOW (ref 30.0–36.0)
MCHC: 30 g/dL (ref 30.0–36.0)
MCHC: 32 g/dL (ref 30.0–36.0)
MCHC: 32.3 g/dL (ref 30.0–36.0)
MCV: 97.1 fL (ref 80.0–100.0)
MCV: 99.6 fL (ref 80.0–100.0)
MCV: 98 fL (ref 80.0–100.0)
MCV: 96.4 fL (ref 80.0–100.0)
MCV: 96.6 fL (ref 80.0–100.0)
Platelets: 166 10*3/uL (ref 150–400)
Platelets: 124 10*3/uL — ABNORMAL LOW (ref 150–400)
Platelets: 148 10*3/uL — ABNORMAL LOW (ref 150–400)
Platelets: 151 10*3/uL (ref 150–400)
Platelets: 140 10*3/uL — ABNORMAL LOW (ref 150–400)
RBC: 2.8 MIL/uL — ABNORMAL LOW (ref 4.22–5.81)
RBC: 2.39 MIL/uL — ABNORMAL LOW (ref 4.22–5.81)
RBC: 2.93 MIL/uL — ABNORMAL LOW (ref 4.22–5.81)
RBC: 2.76 MIL/uL — ABNORMAL LOW (ref 4.22–5.81)
RBC: 2.37 MIL/uL — ABNORMAL LOW (ref 4.22–5.81)
RDW: 19.7 % — ABNORMAL HIGH (ref 11.5–15.5)
RDW: 19.9 % — ABNORMAL HIGH (ref 11.5–15.5)
RDW: 19.3 % — ABNORMAL HIGH (ref 11.5–15.5)
RDW: 19.3 % — ABNORMAL HIGH (ref 11.5–15.5)
RDW: 19.2 % — ABNORMAL HIGH (ref 11.5–15.5)
WBC: 12.7 10*3/uL — ABNORMAL HIGH (ref 4.0–10.5)
WBC: 7.5 10*3/uL (ref 4.0–10.5)
WBC: 12.6 10*3/uL — ABNORMAL HIGH (ref 4.0–10.5)
WBC: 11.1 10*3/uL — ABNORMAL HIGH (ref 4.0–10.5)
WBC: 8.3 10*3/uL (ref 4.0–10.5)
nRBC: 0 % (ref 0.0–0.2)
nRBC: 0 % (ref 0.0–0.2)
nRBC: 0 % (ref 0.0–0.2)
nRBC: 0 % (ref 0.0–0.2)
nRBC: 0.2 % (ref 0.0–0.2)

## 2020-09-23 LAB — HEPATIC FUNCTION PANEL
ALT: 86 U/L — ABNORMAL HIGH (ref 0–44)
AST: 67 U/L — ABNORMAL HIGH (ref 15–41)
Albumin: 2.8 g/dL — ABNORMAL LOW (ref 3.5–5.0)
Alkaline Phosphatase: 140 U/L — ABNORMAL HIGH (ref 38–126)
Bilirubin, Direct: 0.7 mg/dL — ABNORMAL HIGH (ref 0.0–0.2)
Indirect Bilirubin: 1 mg/dL — ABNORMAL HIGH (ref 0.3–0.9)
Total Bilirubin: 1.7 mg/dL — ABNORMAL HIGH (ref 0.3–1.2)
Total Protein: 4.9 g/dL — ABNORMAL LOW (ref 6.5–8.1)

## 2020-09-23 LAB — APTT
aPTT: 57 seconds — ABNORMAL HIGH (ref 24–36)
aPTT: 56 seconds — ABNORMAL HIGH (ref 24–36)

## 2020-09-23 LAB — PREPARE RBC (CROSSMATCH)

## 2020-09-23 LAB — GLUCOSE, CAPILLARY
Glucose-Capillary: 105 mg/dL — ABNORMAL HIGH (ref 70–99)
Glucose-Capillary: 112 mg/dL — ABNORMAL HIGH (ref 70–99)
Glucose-Capillary: 123 mg/dL — ABNORMAL HIGH (ref 70–99)
Glucose-Capillary: 128 mg/dL — ABNORMAL HIGH (ref 70–99)
Glucose-Capillary: 128 mg/dL — ABNORMAL HIGH (ref 70–99)
Glucose-Capillary: 129 mg/dL — ABNORMAL HIGH (ref 70–99)
Glucose-Capillary: 146 mg/dL — ABNORMAL HIGH (ref 70–99)

## 2020-09-23 LAB — PROTIME-INR
INR: 1.6 — ABNORMAL HIGH (ref 0.8–1.2)
Prothrombin Time: 18.2 seconds — ABNORMAL HIGH (ref 11.4–15.2)

## 2020-09-23 LAB — LACTATE DEHYDROGENASE: LDH: 590 U/L — ABNORMAL HIGH (ref 98–192)

## 2020-09-23 LAB — LACTIC ACID, PLASMA
Lactic Acid, Venous: 0.9 mmol/L (ref 0.5–1.9)
Lactic Acid, Venous: 0.8 mmol/L (ref 0.5–1.9)

## 2020-09-23 LAB — FIBRINOGEN: Fibrinogen: 429 mg/dL (ref 210–475)

## 2020-09-23 MED ORDER — FREE WATER
100.0000 mL | Status: DC
  Administered 2020-09-23 – 2020-09-26 (×70): 100 mL

## 2020-09-23 MED ORDER — NICARDIPINE HCL IN NACL 20-0.86 MG/200ML-% IV SOLN
0.0000 mg/h | INTRAVENOUS | Status: DC
  Administered 2020-09-23: 19:00:00 7.5 mg/h via INTRAVENOUS
  Administered 2020-09-23: 16:00:00 2.5 mg/h via INTRAVENOUS
  Administered 2020-09-24: 16:00:00 7.5 mg/h via INTRAVENOUS
  Administered 2020-09-24: 11:00:00 5 mg/h via INTRAVENOUS
  Administered 2020-09-25: 13:00:00 7.5 mg/h via INTRAVENOUS
  Administered 2020-09-25: 06:00:00 2 mg/h via INTRAVENOUS
  Administered 2020-09-26 – 2020-09-27 (×4): 5 mg/h via INTRAVENOUS
  Administered 2020-09-27: 13:00:00 2 mg/h via INTRAVENOUS
  Filled 2020-09-23 (×15): qty 200

## 2020-09-23 MED ORDER — SODIUM CHLORIDE 0.9% IV SOLUTION: Freq: Once | INTRAVENOUS | Status: AC

## 2020-09-23 MED ORDER — SODIUM CHLORIDE 0.9% IV SOLUTION: Freq: Once | INTRAVENOUS | Status: DC

## 2020-09-23 NOTE — Progress Notes (Signed)
ANTICOAGULATION CONSULT NOTE  Pharmacy Consult for bivalirudin Indication: ECMO + bilateral DVTs   Recent Labs    09/21/20 0411 09/21/20 1219 09/22/20 0335 09/22/20 0345 09/22/20 1536 09/22/20 1538 09/23/20 0415 09/23/20 0421 09/23/20 0953 09/23/20 1323 09/23/20 1325 09/23/20 1412 09/23/20 1541 09/23/20 1837  HGB 9.8*   < > 8.3*   < > 8.1*   < > 8.4*   < > 7.1* 8.6*   < > 8.5* 8.5* 8.2*  HCT 29.7*   < > 27.4*   < > 25.5*   < > 27.2*   < > 23.8* 28.7*   < > 25.0* 26.6* 24.0*  PLT 144*   < > 154   < > 151   < > 166  --  124* 148*  --   --  151  --   APTT 52*   < > 56*  --  61*  --  57*  --   --   --   --   --  56*  --   LABPROT 20.5*  --  19.1*  --   --   --  18.2*  --   --   --   --   --   --   --   INR 1.8*  --  1.7*  --   --   --  1.6*  --   --   --   --   --   --   --   CREATININE 1.27*   < > 1.15  --  1.08  --  1.00  --   --   --   --   --  1.00  --    < > = values in this interval not displayed.    Estimated Creatinine Clearance: 98.1 mL/min (by C-G formula based on SCr of 1 mg/dL).   Assessment: 47 yom unvaccinated presenting with severe COVID ARDs, intubated on 12/6, continued to have ventilation issues despite NMB and proning - started on VV ECMO on 12/8.  D-dimer >20 > now improved 14, found to have bilateral DVTs.  Given significant bleeding issues from trach, targeting lower aPTT goal of 50-55 seconds after discussion with ECMO team; but bleeding now improving, so changed goal to 50-60 seconds.   aPTT this evening is at goal at 56 sec on bivalirudin drip 0.1 mg/kg/hr.  Hgb drifted down 10>8 over last few days s/p PRBC today watch CBC, no bleeding, plt stable 150s. Fibrinogen trending back up to 429, LDH stable 400-500s .   Goal of Therapy:  aPTT 50-60 seconds Monitor platelets by anticoagulation protocol: Yes   Plan:  Continue IV bivalirudin to 0.1 mg/kg/hr  Check q12h aPTT Follow bleeding, CBC    Leota Sauers Pharm.D. CPP, BCPS Clinical  Pharmacist [phone] 09/23/2020 7:08 PM   Please check AMION for all [facility] phone numbers After 10:00 PM, call Main Pharmacy [phone]

## 2020-09-23 NOTE — Procedures (Signed)
Extracorporeal support note     ECLS cannulation: 09/25/2020 Last circuit change: 09/11/2020  Indication: COVD ARDS   Configuration: VV, RIJ 32Fr [place]   Pump speed: 3700 Pump flow: 4.5L  Pump used: Cardiohelp   Sweep gas: 100%,4LPM   Circuit check: 2 small clots at the corner were noted Anticoagulant: bivalirudin Anticoagulation targets: PTT 50-70    Changes in support: Received Plax yesterday, continue current support.     Anticipated goals/duration of support: bridge to recovery   Cheri Fowler MD [facility] Pager: [phone] Mobile: [phone]

## 2020-09-23 NOTE — Progress Notes (Signed)
Patient ID: Donald Rowe, male   DOB: 08/20/1961, 59 y.o.   MRN: [id number]    Advanced Heart Failure Rounding Note   Subjective:    12/8 Cannulated for VV ECMO - 32 FR  RIJ [place] 12/9 Extubated/Reintubated for altered mental status 12/13 Circuit change for elevated LDH 12/15 PLEX started 12/16 Tracheostomy 12/23 head CT (normal) for change in mental status.   Remains on Sweep 4. Got 1u RBC last night. BP remains labile. Meds adjusted.   CXR about the same. Cannula position looks good. Circuit with small bit of Fibrin. PICC replaced.   Had PLEX 4/4 12/22 LDH 434 -> 544 -> 419 -> 551.   ECMO   Speed 3700 Flow 4.5L Sweep 4 dP 20 pVen  -108  ABG: 7.37/58/83/95% Hgb 8.4 LDH 434 -> 544 -> 419 -> 551 -> 579 -> 590 Lactic acid 0.9 PTT 57  Vent 40% TVs 400-500s   Objective:   Weight Range:  Vital Signs:   Temp:  [98.24 F (36.8 C)-98.78 F (37.1 C)] 98.42 F (36.9 C) (12/25 0700) Pulse Rate:  [85-120] 114 (12/25 0700) Resp:  [16-38] 35 (12/25 0700) BP: (98-182)/(43-90) 160/66 (12/25 0700) SpO2:  [93 %-100 %] 96 % (12/25 0700) Arterial Line BP: (92-226)/(34-75) 202/69 (12/25 0700) FiO2 (%):  [40 %] 40 % (12/25 0415) Weight:  [111.9 kg] 111.9 kg (12/25 0600) Last BM Date: 09/22/20  Weight change: Filed Weights   09/21/20 0600 09/22/20 0600 09/23/20 0600  Weight: 109.1 kg 109.2 kg 111.9 kg    Intake/Output:   Intake/Output Summary (Last 24 hours) at 09/23/2020 0742 Last data filed at 09/23/2020 0700 Gross per 24 hour  Intake 6232.61 ml  Output 6455 ml  Net -222.39 ml     Physical Exam: General:  Sedated on vent HEENT: normal Neck: supple. +RIJ cannula + trach Cor: PMI nondisplaced. Regular rate & rhythm. No rubs, gallops or murmurs. Lungs: coarse Abdomen: obese soft, nontender, nondistended. No hepatosplenomegaly. No bruits or masses. Good bowel sounds. Extremities: no cyanosis, clubbing, rash, 1+ edema Neuro: intubated sedate   Telemetry:  sinus 100-115 Personally reviewed    Labs: Basic Metabolic Panel: Recent Labs  Lab 09/21/20 0411 09/21/20 1219 09/21/20 1522 09/21/20 2016 09/22/20 0335 09/22/20 0345 09/22/20 1536 09/22/20 1538 09/22/20 2015 09/22/20 2341 09/23/20 0415 09/23/20 0421  NA 150*   < > 150*   < > 150*   < > 151* 151* 151* 151* 152* 150*  K 5.4*   < > 5.1   < > 4.8   < > 5.4* 5.3* 5.2* 4.8 4.9 4.9  CL 115*  --  115*  --  113*  --  112*  --   --   --  115*  --   CO2 28  --  30  --  30  --  31  --   --   --  31  --   GLUCOSE 178*  --  159*  --  154*  --  160*  --   --   --  130*  --   BUN 95*  --  98*  --  91*  --  89*  --   --   --  81*  --   CREATININE 1.27*  --  1.18  --  1.15  --  1.08  --   --   --  1.00  --   CALCIUM 8.4*  --  8.0*  --  8.4*  --  8.7*  --   --   --  8.5*  --    < > = values in this interval not displayed.    Liver Function Tests: Recent Labs  Lab 09/19/20 0412 09/20/20 0322 09/21/20 0411 09/22/20 0335 09/23/20 0415  AST 133* 58* 51* 49* 67*  ALT 159* 77* 68* 62* 86*  ALKPHOS 110 66 81 100 140*  BILITOT 5.0* 2.9* 1.7* 1.9* 1.7*  PROT 4.2* 4.3* 4.8* 4.7* 4.9*  ALBUMIN 2.8* 3.5 3.2* 2.9* 2.8*   No results for input(s): LIPASE, AMYLASE in the last 168 hours. No results for input(s): AMMONIA in the last 168 hours.  CBC: Recent Labs  Lab 09/22/20 0335 09/22/20 0345 09/22/20 1216 09/22/20 1408 09/22/20 1536 09/22/20 1538 09/22/20 2015 09/22/20 2340 09/22/20 2341 09/23/20 0415 09/23/20 0421  WBC 13.0*  --  10.4  --  11.3*  --   --  8.9  --  12.7*  --   HGB 8.3*   < > 8.0*   < > 8.1*   < > 7.5* 7.0* 6.5* 8.4* 7.8*  HCT 27.4*   < > 25.1*   < > 25.5*   < > 22.0* 21.8* 19.0* 27.2* 23.0*  MCV 97.5  --  97.7  --  96.6  --   --  96.9  --  97.1  --   PLT 154  --  135*  --  151  --   --  146*  --  166  --    < > = values in this interval not displayed.    Cardiac Enzymes: No results for input(s): CKTOTAL, CKMB, CKMBINDEX, TROPONINI in the last 168  hours.  BNP: BNP (last 3 results) No results for input(s): BNP in the last 8760 hours.  ProBNP (last 3 results) No results for input(s): PROBNP in the last 8760 hours.    Other results:  Imaging: DG CHEST PORT 1 VIEW  Result Date: 09/22/2020 CLINICAL DATA:  COVID positive.  ECMO. EXAM: PORTABLE CHEST 1 VIEW COMPARISON:  09/21/2020 FINDINGS: 0540 hours. Tracheostomy and feeding tubes again noted. ECMO cannula appears stable given differential positioning. Right PICC line tip is obscured by the ECMO cannula. Diffuse bilateral airspace disease is similar. Persistent pneumomediastinum. Telemetry leads overlie the chest. IMPRESSION: No substantial interval change in exam. Diffuse bilateral airspace disease with pneumomediastinum. Electronically Signed   By: Misty Stanley M.D.   On: 09/22/2020 06:39   VAS [place] UPPER EXTREMITY VENOUS DUPLEX  Result Date: 09/21/2020 UPPER VENOUS STUDY  Indications: Swelling, and Line in right arm Limitations: Line, bandages and trach collar. Comparison Study: No prior study Performing Technologist: Sharion Dove RVS  Examination Guidelines: A complete evaluation includes B-mode imaging, spectral Doppler, color Doppler, and power Doppler as needed of all accessible portions of each vessel. Bilateral testing is considered an integral part of a complete examination. Limited examinations for reoccurring indications may be performed as noted.  Right Findings: +----------+------------+---------+-----------+----------+---------------------+ RIGHT     CompressiblePhasicitySpontaneousProperties       Summary        +----------+------------+---------+-----------+----------+---------------------+ IJV                                                    Not visualized     +----------+------------+---------+-----------+----------+---------------------+ Subclavian    Full       Yes       Yes                                     +----------+------------+---------+-----------+----------+---------------------+  Axillary      Full       Yes       Yes                                    +----------+------------+---------+-----------+----------+---------------------+ Brachial      Full       Yes       Yes              not all segment well                                                           visualized       +----------+------------+---------+-----------+----------+---------------------+ Radial        Full                                                        +----------+------------+---------+-----------+----------+---------------------+ Ulnar                                                patent by color and                                                             Doppler        +----------+------------+---------+-----------+----------+---------------------+ Cephalic      Full                                                        +----------+------------+---------+-----------+----------+---------------------+ Basilic       Full                                                        +----------+------------+---------+-----------+----------+---------------------+  Left Findings: +----------+------------+---------+-----------+----------+-------+ LEFT      CompressiblePhasicitySpontaneousPropertiesSummary +----------+------------+---------+-----------+----------+-------+ Subclavian    Full       Yes       Yes                      +----------+------------+---------+-----------+----------+-------+  Summary:  Right: No evidence of deep vein thrombosis in the upper extremity. No evidence of superficial vein thrombosis in the upper extremity.  Left: No evidence of thrombosis in the subclavian.  *See table(s) above for measurements and observations.  Diagnosing physician: Monica Martinez MD Electronically signed by Monica Martinez MD on 09/21/2020 at 7:56:36 PM.    Final     [place] EKG SITE RITE  Result Date:  09/22/2020 If Site Rite image not attached, placement could not be confirmed due to current cardiac rhythm.    Medications:     Scheduled Medications: . sodium chloride   Intravenous Once  . amLODipine  10 mg Per Tube QHS  . aspirin  81 mg Per Tube Daily  . calcium gluconate  2 g Intravenous Once  . chlorhexidine gluconate (MEDLINE KIT)  15 mL Mouth Rinse BID  . Chlorhexidine Gluconate Cloth  6 each Topical Daily  . clonazePAM  2 mg Per Tube BID  . cloNIDine  0.2 mg Per Tube TID  . docusate  100 mg Per Tube BID  . feeding supplement (PROSource TF)  45 mL Per Tube TID  . fiber  1 packet Per Tube BID  . free water  400 mL Per Tube Q4H  . furosemide  40 mg Intravenous Daily  . hydrALAZINE  25 mg Per Tube TID AC  . insulin aspart  2-6 Units Subcutaneous Q4H  . insulin aspart  5 Units Subcutaneous Q4H  . insulin detemir  25 Units Subcutaneous Q12H  . levothyroxine  50 mcg Per Tube Q0600  . mouth rinse  15 mL Mouth Rinse 10 times per day  . methylPREDNISolone (SOLU-MEDROL) injection  40 mg Intravenous Q24H  . metoprolol tartrate  50 mg Per Tube TID PC  . oxyCODONE  10 mg Per Tube Q6H  . pantoprazole sodium  40 mg Per Tube BID  . pravastatin  40 mg Per Tube q1800  . QUEtiapine  100 mg Per Tube BID  . sodium chloride flush  10-40 mL Intracatheter Q12H  . sodium chloride flush  10-40 mL Intracatheter Q12H    Infusions: . sodium chloride Stopped (09/23/20 0043)  . sodium chloride 10 mL/hr at 09/20/20 1900  . albumin human    . albumin human Stopped (09/23/20 0647)  . bivalirudin (ANGIOMAX) infusion 0.5 mg/mL (Non-ACS indications) 0.1 mg/kg/hr (09/23/20 0700)  . citrate dextrose    . dexmedetomidine (PRECEDEX) IV infusion 1 mcg/kg/hr (09/23/20 0700)  . dextrose    . feeding supplement (PIVOT 1.5 CAL) 1,000 mL (09/23/20 0309)  . fentaNYL infusion INTRAVENOUS 100 mcg/hr (09/23/20 0700)  . norepinephrine (LEVOPHED) Adult infusion      PRN  Medications: sodium chloride, acetaminophen (TYLENOL) oral liquid 160 mg/5 mL, acetaminophen, albumin human, albuterol, dextrose, diphenhydrAMINE, diphenhydrAMINE, fentaNYL, Gerhardt's butt cream, heparin, labetalol, lip balm, LORazepam, ondansetron **OR** ondansetron (ZOFRAN) IV, phenylephrine, polyethylene glycol, sodium chloride flush, sodium chloride flush   Assessment/Plan:   1. Acute hypoxic respiratory failure/ARDS in setting of COVID-19 PNA - Cannulated for VV ECMO on 12/8 after failing intubation x 2 days - has complete remedisivir, baricitinib, Solumedrol.  - s/p trach 12/16 - has diuresed well. Weight stable about 10 pounds up from baseline. Creatinine improved. Continue IV lasix at 40 daily - CXR stable this am Sweep remains at 4 Oxygenation stable Attempt to wean sweep again today.  Cannula position looks good - evidence of worsening acidosis/infection 12/17 mero added. Mero completed 12/21 - staph in tracheal aspirate -  vanc added 12/14 mero added 12/17. vanc complete 12/20 - circuit changed 12/13 for elevated LDH and lower flows. PLEX started 12/15. S/p PLEX #4/4 on 12/21 Saturday. LDH stable - PTT goal turned down to 50-60 due to bleeding. Improved. Circuit looks ok. PTT 57 today. Bleeding has resolved. Discussed dosing with PharmD personally. - Pulling good volumes on vent. No change - Mental status remains a major issue. Meds adjusted. Remains on precedex  2. Bilateral DVT - remains on bival - Dosing d/w PharmD - no change  3. DM2 - Insulin adjusted. CBGs improved   4. Obesity -Body mass index is 33.73 kg/m.  5. F/E/N - Stable 152 - Increase free water  6. HTN - Remains very labile  - Meds adjusted again today.. May have to tolerate some permissive hypertension (especially during periods of agitation) to avoid hypotension. - no change  7. RUE skin breakdown around PICC site - PICC switched to left  8. AKI - diuresing briskly. BUN/CR imroved -  continue lasix 40 IV  - d/w Renal at bedside  9. Acute encephalopathy - remains agitated  - head CT ok 09/07/20 and 12/21 - switched to Precedex  Plan discussed on multi-discplinary ECMO rounds with CCM, ECMO coordinator/specialist, PharmDs and RNs.  CRITICAL CARE Performed by: Glori Bickers  CRITICAL CARE Performed by: Glori Bickers  Total critical care time: 40 minutes  Critical care time was exclusive of separately billable procedures and treating other patients.  Critical care was necessary to treat or prevent imminent or life-threatening deterioration.  Critical care was time spent personally by me (independent of midlevel providers or residents) on the following activities: development of treatment plan with patient and/or surrogate as well as nursing, discussions with consultants, evaluation of patient's response to treatment, examination of patient, obtaining history from patient or surrogate, ordering and performing treatments and interventions, ordering and review of laboratory studies, ordering and review of radiographic studies, pulse oximetry and re-evaluation of patient's condition.    Length of Stay: 21   Glori Bickers MD 09/23/2020, 7:42 AM  Advanced Heart Failure Team Pager [phone] (M-F; [place])  Please contact [facility] for night-coverage after hours (4p -7a ) and weekends on [email or web address]

## 2020-09-23 NOTE — Progress Notes (Signed)
   Palliative Medicine Inpatient Follow Up Note  Reason for consult:  "ECMO"  HPI:  Per intake H&P --> Donald Rowe a 59 y.o.malewith a pertinent history ofhypothyroidism on levothyroxine and hyperlipidemia who was diagnosed with Covid 1 week ago and is not vaccinated. With EMS he was seen to be desaturating to 78% on room air. He states he started having symptoms on Friday and tested positive on Saturday.  Had initially elected to be DNAR without intubation though later changed this and got intubated on 12/6  in the setting of worsening respiratory failure with hypoxia. CTA chest w/ diffuse bilateral infiltrates. On 12/8 was cannulated for VV ECMO. Had been extubated on 12/9 then emergently reintubated in early evening due to clinical instability.   Palliative care was asked to get involved in the setting of VV ECMO to set goals and expectations with Donald Rowe's family.  Today's Discussion (09/23/2020): Chart reviewed.  I met with the nursing staff at bedside - they share patient remains to have ongoing anemia. Has been weaned off fentanyl is still on seroquel and precedex. Per nursing since weaned off fentanyl has not been agitated. Patient got a head CT on 12/9 which was (-). Regarding ECMO patient is on a sweep of 4 and stable. Completed x4 treatments of plasmapheresis with LDH in 500's. Blood pressures remain fairly labile.  I called patients wife, Donald Rowe to provide her a daily update. I shared with her the above information. She is thankful for this. She expressed that her biggest concern in visiting with Donald Rowe yesterday was his mental state. She states that she will be here to visit with Donald Rowe tomorrow and we agreed to meet in person to talk further.   Objective Assessment: Vital Signs Vitals:   09/23/20 1230 09/23/20 1245  BP: (!) 167/69   Pulse: (!) 102 (!) 109  Resp: (!) 29 (!) 30  Temp: 98.42 F (36.9 C) 98.42 F (36.9 C)  SpO2: 93% 93%    Intake/Output Summary  (Last 24 hours) at 09/23/2020 1300 Last data filed at 09/23/2020 1215 Gross per 24 hour  Intake 5999.76 ml  Output 6680 ml  Net -680.24 ml   Last Weight  Most recent update: 09/23/2020  6:32 AM   Weight  111.9 kg (246 lb 11.1 oz)           Gen:  Middle age caucasian M intubated and sedated HEENT: trach in place, dry mucous membranes CV: Regular rate and rhythm  PULM: on vent support Neuro:   SUMMARY OF RECOMMENDATIONS Full Code/Full Scope of Care  ECMO as a bridge to recovery  Spiritual support appreciated  Appreciate Dr. Tacy Learn calling to update patients wife this afternoon  Ongoing PMT support    Time Spent: 25 Greater than 50% of the time was spent in counseling and coordination of care ______________________________________________________________________________________ [place] Team Team Cell Phone: [phone] Please utilize secure chat with additional questions, if there is no response within 30 minutes please call the above phone number  Palliative Medicine Team providers are available by phone from 7am to 7pm daily and can be reached through the team cell phone.  Should this patient require assistance outside of these hours, please call the patient's attending physician.

## 2020-09-23 NOTE — Progress Notes (Signed)
NAME:  Donald Rowe, MRN:  [id number], DOB:  [birth year], LOS: 21 ADMISSION DATE:  09/17/2020, CONSULTATION DATE:  12/6 REFERRING MD:  Dr. Aileen Fass, CHIEF COMPLAINT:  SOB    Brief History   59 y/o M admitted 12/4 with 1 week hx of SOB, known COVID positive.  He is unvaccinated.  CTA chest negative for PE but demonstrated diffuse bilateral infiltrates. [place] started on 12/8   Past Medical History  Hypothyroidism  [facility] Events/Procedures  12/04 Admit  12/06 PCCM consulted  12/07 Intubated, central line, a line 12/08 VV ECMO Cannulaton, cortrak 12/13 circuit change for hemolytic anemia 12/16 Tracheostomy  12/17 bronch w/ BAL  Consults:  PCCM, Heart failure, TCTS, ECMO  Significant Diagnostic Tests:   CTA Chest 12/4 >> extensive bilateral airspace disease, no large PE identified, limited study   LE Venous Duplex 12/4 >> negative for DVT bilaterally   LE venous duplex 12/8> BLE DVTs  RUE venous duplex 12/23  RUE arterial duplex 12/23  Micro Data:  COVID 12/4 >> negative  Influenza A/B 12/4 >> negative  MRSA PCR 12/5 >> negative  BCx2 12/4 >> NG Trach aspirate 12/12> rare MRSA BAL 12/17> MRSA, candida Aspergillus Ag BAL 12/17> 0.05 12/18 sars2: positive  Antimicrobials:  Azithromycin 12/5 >> 12/6  Ceftriaxone 12/5 >> 12/6  Cefepime 12/7>12/14 vanc 12/14>12/20 Meropenem 12/17>12/21  Interim history/subjective:  Blood pressure remained labile ranges from systolic 258 to 93, on metoprolol, hydralazine and clonidine  Gets severely agitated upon stimulation, currently on Precedex and fentanyl infusion And remain elevated 150 - 152  Hemoglobin dropped to 7.1, transfuse 1 unit PRBC  Objective   Blood pressure (!) 149/53, pulse (!) 108, temperature 98.42 F (36.9 C), resp. rate (!) 26, height _0  (1.753 m), weight 111.9 kg, SpO2 94 %.    Vent Mode: PCV FiO2 (%):  [40 %] 40 % Set Rate:  [12 bmp] 12 bmp PEEP:  [10 cmH20] 10 cmH20 Plateau  Pressure:  [32 cmH20] 32 cmH20   Intake/Output Summary (Last 24 hours) at 09/23/2020 1139 Last data filed at 09/23/2020 1111 Gross per 24 hour  Intake 5681.26 ml  Output 6830 ml  Net -1148.74 ml   Filed Weights   09/21/20 0600 09/22/20 0600 09/23/20 0600  Weight: 109.1 kg 109.2 kg 111.9 kg    Examination: Gen: Well-developed, acutely ill-appearing obese male, lying on the bed  HEENT: NCAT, perrla, s/p trach Neck: supple, ECMO lines intact and functioning CV: Tachycardic, regular rhythm, no murmur  Pulm: Distant breath sounds, no obvious wheeze or rales Abd: Soft, BS+, NTND Extm: Peripheral pulses intact 2+ radial pulse on RUE, diffuse edema Skin: Dry, Warm, small areas of ecchymois throughout B/L fingers with some mottling in 1-2 digits R>>>L Neuro: Eyes closed, does not open, not following commands.  Nonpurposefully moving all 4 extremities.  Pupils 3 mm minimally reactive  Resolved Hospital Problem list    CAP with MRSA, completed treatment  Assessment & Plan:   Acute hypoxemic and hypercarbic respiratory failure secondary to COVID PNA with severe ARDS. S/p VV ECMO cannulation 09/05/2020.  S/p tracheostomy 52/77 complicated by trach site bleeding improved  Completed remdesivir.  Completed baricitinib.  Appreciate heart failure recs c/w full ECMO support; bival for [facility] Continue Solu-Medrol 40 mg once daily, will plan for slow taper Continue routine trach care VAP prevention protocol Completed for treatment with 4 sessions of Plex Continue diuresis with Lasix 40 mg once daily Stable pneumomediastinum noted on cxr no ptx appreciated  Sweep on ECMO is 4, continue to titrate down as tolerated LDH is slowly trending up but remain in the 500s  Agitated delirium requiring titration of IV sedation Continue Precedex and fentanyl infusion Minimize sedation with RASS goal 1 and tolerance of MV & ECMO Con't seroquel Con't enteral oxycodone and clonazepam  Bilateral lower ext DVTs-  provoked by COVID infection On bivaliriduin; goal PTT 50-55 currently PICC line was changed to left side yesterday  Steroid-induced hyperglycemia Fingersticks are better controlled now Continue Levemir 25 units Continue TF coverage (but decreasing) and SSI PRN  Hypothyroidism con't PTA synthroid   Labile hypertension, contributed by periods of agitations Patient blood pressure remained labile ranging from systolic 765 and dropping down to 93 Currently on clonidine 0.2 mg every 8 hours, hydralazine 25 mg every 8 hours and metoprolol  Hemolytic Anemia Throbocytopenia Hyperbilirubinemia Rheumatologic-associated MAHA remains in differential.  hgb and plt are stable Completed treatment with the Plex for 4 sessions Hemoglobin dropped to 7.1, last night he received 1 unit PRBCs Transfuse if hgb <8  Hypernatremia and hyperchloremia Azotemia Continue free water 100 cc an hour  Strict I/Os  Plan discussed with Palliative Care, Cardiology, ECMO specialist, RN, pharmacy at beside.    Best practice (evaluated daily)  Diet: TF Pain/Anxiety/Delirium protocol (if indicated): Precedex and fentanyl VAP protocol (if indicated): yes DVT prophylaxis: Bivalirudin GI prophylaxis: pantoprazole  BID Glucose control: basal-bolus Mobility: As tolerated Last date of multidisciplinary goals of care discussion: wife updated over the phone 12/23 Summary of discussion con't aggressive care Follow up goals of care discussion due: 12/30 Code Status: full Disposition: ICU   Total critical care time: 39 minutes   Critical care time was exclusive of separately billable procedures and treating other patients.   Critical care was necessary to treat or prevent imminent or life-threatening deterioration.   Critical care was time spent personally by me on the following activities: development of treatment plan with patient and/or surrogate as well as nursing, discussions with consultants, evaluation of  patient's response to treatment, examination of patient, obtaining history from patient or surrogate, ordering and performing treatments and interventions, ordering and review of laboratory studies, ordering and review of radiographic studies, pulse oximetry and re-evaluation of patient's condition.   Jacky Kindle MD [facility] Pager: [phone] Mobile: [phone]

## 2020-09-23 NOTE — Progress Notes (Signed)
RT removed sutures without complications. RN at bedside to help assist.

## 2020-09-23 NOTE — Progress Notes (Signed)
ANTICOAGULATION CONSULT NOTE  Pharmacy Consult for bivalirudin Indication: ECMO + bilateral DVTs   Recent Labs    09/21/20 0411 09/21/20 1219 09/22/20 0335 09/22/20 0345 09/22/20 1536 09/22/20 1538 09/22/20 2340 09/22/20 2341 09/23/20 0415 09/23/20 0421  HGB 9.8*   < > 8.3*   < > 8.1*   < > 7.0* 6.5* 8.4* 7.8*  HCT 29.7*   < > 27.4*   < > 25.5*   < > 21.8* 19.0* 27.2* 23.0*  PLT 144*   < > 154   < > 151  --  146*  --  166  --   APTT 52*   < > 56*  --  61*  --   --   --  57*  --   LABPROT 20.5*  --  19.1*  --   --   --   --   --  18.2*  --   INR 1.8*  --  1.7*  --   --   --   --   --  1.6*  --   CREATININE 1.27*   < > 1.15  --  1.08  --   --   --  1.00  --    < > = values in this interval not displayed.    Estimated Creatinine Clearance: 98.1 mL/min (by C-G formula based on SCr of 1 mg/dL).   Assessment: 19 yom unvaccinated presenting with severe COVID ARDs, intubated on 12/6, continued to have ventilation issues despite NMB and proning - started on VV ECMO on 12/8.  D-dimer >20 > now improved 14, found to have bilateral DVTs.  Given significant bleeding issues from trach, targeting lower aPTT goal of 50-55 seconds after discussion with ECMO team; but bleeding now improving, so changed goal to 50-60 seconds.   aPTT this morning is at goal at 57 sec on bivalirudin drip 0.1 mg/kg/hr.  Hgb down 7.8 (receiving 1 PRBC) but relatively stable with no bleeding, plt 166 stable. Fibrinogen up to 429,, LDH stable in 500s (slowly trending up).   Goal of Therapy:  aPTT 50-60 seconds Monitor platelets by anticoagulation protocol: Yes   Plan:  Continue IV bivalirudin to 0.1 mg/kg/hr  Check q12h aPTT Follow bleeding, CBC  Sherron Monday, PharmD, BCCCP Clinical Pharmacist  Phone: [phone] 09/23/2020 7:18 AM  Please check AMION for all [facility] Pharmacy phone numbers After 10:00 PM, call Main Pharmacy [phone]

## 2020-09-23 NOTE — Progress Notes (Signed)
eLink Physician-Brief Progress Note Patient Name: Donald Rowe DOB: May 28, 1961 MRN: [id number]   Date of Service  09/23/2020  HPI/Events of Note  Hb < 7   eICU Interventions  1 unit RBC to be given RN to take consent     Intervention Category Major Interventions: Other:  Oretha Milch 09/23/2020, 2:49 AM

## 2020-09-24 ENCOUNTER — Inpatient Hospital Stay (HOSPITAL_COMMUNITY): Payer: HRSA Program

## 2020-09-24 DIAGNOSIS — E870 Hyperosmolality and hypernatremia: Secondary | ICD-10-CM | POA: Diagnosis not present

## 2020-09-24 DIAGNOSIS — J189 Pneumonia, unspecified organism: Secondary | ICD-10-CM

## 2020-09-24 DIAGNOSIS — U071 COVID-19: Secondary | ICD-10-CM | POA: Diagnosis not present

## 2020-09-24 DIAGNOSIS — J80 Acute respiratory distress syndrome: Secondary | ICD-10-CM

## 2020-09-24 DIAGNOSIS — E871 Hypo-osmolality and hyponatremia: Secondary | ICD-10-CM | POA: Diagnosis not present

## 2020-09-24 DIAGNOSIS — J9601 Acute respiratory failure with hypoxia: Secondary | ICD-10-CM | POA: Diagnosis not present

## 2020-09-24 DIAGNOSIS — Z7189 Other specified counseling: Secondary | ICD-10-CM | POA: Diagnosis not present

## 2020-09-24 DIAGNOSIS — Z515 Encounter for palliative care: Secondary | ICD-10-CM | POA: Diagnosis not present

## 2020-09-24 LAB — POCT I-STAT 7, (LYTES, BLD GAS, ICA,H+H)
Acid-Base Excess: 4 mmol/L — ABNORMAL HIGH (ref 0.0–2.0)
Acid-Base Excess: 7 mmol/L — ABNORMAL HIGH (ref 0.0–2.0)
Acid-Base Excess: 7 mmol/L — ABNORMAL HIGH (ref 0.0–2.0)
Acid-Base Excess: 7 mmol/L — ABNORMAL HIGH (ref 0.0–2.0)
Acid-Base Excess: 8 mmol/L — ABNORMAL HIGH (ref 0.0–2.0)
Acid-Base Excess: 5 mmol/L — ABNORMAL HIGH (ref 0.0–2.0)
Acid-Base Excess: 5 mmol/L — ABNORMAL HIGH (ref 0.0–2.0)
Acid-Base Excess: 4 mmol/L — ABNORMAL HIGH (ref 0.0–2.0)
Acid-Base Excess: 3 mmol/L — ABNORMAL HIGH (ref 0.0–2.0)
Bicarbonate: 31.8 mmol/L — ABNORMAL HIGH (ref 20.0–28.0)
Bicarbonate: 34 mmol/L — ABNORMAL HIGH (ref 20.0–28.0)
Bicarbonate: 33.9 mmol/L — ABNORMAL HIGH (ref 20.0–28.0)
Bicarbonate: 34.6 mmol/L — ABNORMAL HIGH (ref 20.0–28.0)
Bicarbonate: 35.5 mmol/L — ABNORMAL HIGH (ref 20.0–28.0)
Bicarbonate: 32.8 mmol/L — ABNORMAL HIGH (ref 20.0–28.0)
Bicarbonate: 33.1 mmol/L — ABNORMAL HIGH (ref 20.0–28.0)
Bicarbonate: 30.9 mmol/L — ABNORMAL HIGH (ref 20.0–28.0)
Bicarbonate: 30.3 mmol/L — ABNORMAL HIGH (ref 20.0–28.0)
Calcium, Ion: 1.21 mmol/L (ref 1.15–1.40)
Calcium, Ion: 1.28 mmol/L (ref 1.15–1.40)
Calcium, Ion: 1.28 mmol/L (ref 1.15–1.40)
Calcium, Ion: 1.24 mmol/L (ref 1.15–1.40)
Calcium, Ion: 1.27 mmol/L (ref 1.15–1.40)
Calcium, Ion: 1.27 mmol/L (ref 1.15–1.40)
Calcium, Ion: 1.26 mmol/L (ref 1.15–1.40)
Calcium, Ion: 1.22 mmol/L (ref 1.15–1.40)
Calcium, Ion: 1.24 mmol/L (ref 1.15–1.40)
HCT: 23 % — ABNORMAL LOW (ref 39.0–52.0)
HCT: 22 % — ABNORMAL LOW (ref 39.0–52.0)
HCT: 22 % — ABNORMAL LOW (ref 39.0–52.0)
HCT: 23 % — ABNORMAL LOW (ref 39.0–52.0)
HCT: 24 % — ABNORMAL LOW (ref 39.0–52.0)
HCT: 21 % — ABNORMAL LOW (ref 39.0–52.0)
HCT: 22 % — ABNORMAL LOW (ref 39.0–52.0)
HCT: 23 % — ABNORMAL LOW (ref 39.0–52.0)
HCT: 24 % — ABNORMAL LOW (ref 39.0–52.0)
Hemoglobin: 7.8 g/dL — ABNORMAL LOW (ref 13.0–17.0)
Hemoglobin: 7.5 g/dL — ABNORMAL LOW (ref 13.0–17.0)
Hemoglobin: 7.5 g/dL — ABNORMAL LOW (ref 13.0–17.0)
Hemoglobin: 7.8 g/dL — ABNORMAL LOW (ref 13.0–17.0)
Hemoglobin: 8.2 g/dL — ABNORMAL LOW (ref 13.0–17.0)
Hemoglobin: 7.1 g/dL — ABNORMAL LOW (ref 13.0–17.0)
Hemoglobin: 7.5 g/dL — ABNORMAL LOW (ref 13.0–17.0)
Hemoglobin: 7.8 g/dL — ABNORMAL LOW (ref 13.0–17.0)
Hemoglobin: 8.2 g/dL — ABNORMAL LOW (ref 13.0–17.0)
O2 Saturation: 90 %
O2 Saturation: 88 %
O2 Saturation: 86 %
O2 Saturation: 100 %
O2 Saturation: 86 %
O2 Saturation: 86 %
O2 Saturation: 82 %
O2 Saturation: 95 %
O2 Saturation: 96 %
Patient temperature: 36.8
Patient temperature: 36.7
Patient temperature: 36.8
Patient temperature: 36.6
Patient temperature: 36.7
Patient temperature: 36.2
Patient temperature: 36.5
Patient temperature: 36.7
Potassium: 4.9 mmol/L (ref 3.5–5.1)
Potassium: 5.1 mmol/L (ref 3.5–5.1)
Potassium: 5.3 mmol/L — ABNORMAL HIGH (ref 3.5–5.1)
Potassium: 5.3 mmol/L — ABNORMAL HIGH (ref 3.5–5.1)
Potassium: 5.3 mmol/L — ABNORMAL HIGH (ref 3.5–5.1)
Potassium: 4.9 mmol/L (ref 3.5–5.1)
Potassium: 5 mmol/L (ref 3.5–5.1)
Potassium: 5 mmol/L (ref 3.5–5.1)
Potassium: 5.3 mmol/L — ABNORMAL HIGH (ref 3.5–5.1)
Sodium: 152 mmol/L — ABNORMAL HIGH (ref 135–145)
Sodium: 151 mmol/L — ABNORMAL HIGH (ref 135–145)
Sodium: 151 mmol/L — ABNORMAL HIGH (ref 135–145)
Sodium: 151 mmol/L — ABNORMAL HIGH (ref 135–145)
Sodium: 151 mmol/L — ABNORMAL HIGH (ref 135–145)
Sodium: 151 mmol/L — ABNORMAL HIGH (ref 135–145)
Sodium: 150 mmol/L — ABNORMAL HIGH (ref 135–145)
Sodium: 151 mmol/L — ABNORMAL HIGH (ref 135–145)
Sodium: 150 mmol/L — ABNORMAL HIGH (ref 135–145)
TCO2: 34 mmol/L — ABNORMAL HIGH (ref 22–32)
TCO2: 36 mmol/L — ABNORMAL HIGH (ref 22–32)
TCO2: 36 mmol/L — ABNORMAL HIGH (ref 22–32)
TCO2: 37 mmol/L — ABNORMAL HIGH (ref 22–32)
TCO2: 37 mmol/L — ABNORMAL HIGH (ref 22–32)
TCO2: 35 mmol/L — ABNORMAL HIGH (ref 22–32)
TCO2: 35 mmol/L — ABNORMAL HIGH (ref 22–32)
TCO2: 33 mmol/L — ABNORMAL HIGH (ref 22–32)
TCO2: 32 mmol/L (ref 22–32)
pCO2 arterial: 68.7 mmHg (ref 32.0–48.0)
pCO2 arterial: 69.3 mmHg (ref 32.0–48.0)
pCO2 arterial: 67.5 mmHg (ref 32.0–48.0)
pCO2 arterial: 66.2 mmHg (ref 32.0–48.0)
pCO2 arterial: 67.8 mmHg (ref 32.0–48.0)
pCO2 arterial: 65.9 mmHg (ref 32.0–48.0)
pCO2 arterial: 69.2 mmHg (ref 32.0–48.0)
pCO2 arterial: 57.1 mmHg — ABNORMAL HIGH (ref 32.0–48.0)
pCO2 arterial: 60 mmHg — ABNORMAL HIGH (ref 32.0–48.0)
pH, Arterial: 7.272 — ABNORMAL LOW (ref 7.350–7.450)
pH, Arterial: 7.298 — ABNORMAL LOW (ref 7.350–7.450)
pH, Arterial: 7.308 — ABNORMAL LOW (ref 7.350–7.450)
pH, Arterial: 7.314 — ABNORMAL LOW (ref 7.350–7.450)
pH, Arterial: 7.335 — ABNORMAL LOW (ref 7.350–7.450)
pH, Arterial: 7.301 — ABNORMAL LOW (ref 7.350–7.450)
pH, Arterial: 7.285 — ABNORMAL LOW (ref 7.350–7.450)
pH, Arterial: 7.341 — ABNORMAL LOW (ref 7.350–7.450)
pH, Arterial: 7.31 — ABNORMAL LOW (ref 7.350–7.450)
pO2, Arterial: 69 mmHg — ABNORMAL LOW (ref 83.0–108.0)
pO2, Arterial: 62 mmHg — ABNORMAL LOW (ref 83.0–108.0)
pO2, Arterial: 58 mmHg — ABNORMAL LOW (ref 83.0–108.0)
pO2, Arterial: 376 mmHg — ABNORMAL HIGH (ref 83.0–108.0)
pO2, Arterial: 57 mmHg — ABNORMAL LOW (ref 83.0–108.0)
pO2, Arterial: 56 mmHg — ABNORMAL LOW (ref 83.0–108.0)
pO2, Arterial: 53 mmHg — ABNORMAL LOW (ref 83.0–108.0)
pO2, Arterial: 80 mmHg — ABNORMAL LOW (ref 83.0–108.0)
pO2, Arterial: 92 mmHg (ref 83.0–108.0)

## 2020-09-24 LAB — BASIC METABOLIC PANEL
Anion gap: 7 (ref 5–15)
Anion gap: 9 (ref 5–15)
BUN: 92 mg/dL — ABNORMAL HIGH (ref 6–20)
BUN: 100 mg/dL — ABNORMAL HIGH (ref 6–20)
CO2: 31 mmol/L (ref 22–32)
CO2: 28 mmol/L (ref 22–32)
Calcium: 8.3 mg/dL — ABNORMAL LOW (ref 8.9–10.3)
Calcium: 8.1 mg/dL — ABNORMAL LOW (ref 8.9–10.3)
Chloride: 113 mmol/L — ABNORMAL HIGH (ref 98–111)
Chloride: 112 mmol/L — ABNORMAL HIGH (ref 98–111)
Creatinine, Ser: 1.07 mg/dL (ref 0.61–1.24)
Creatinine, Ser: 1.15 mg/dL (ref 0.61–1.24)
GFR, Estimated: >60 mL/min (ref >60)
GFR, Estimated: >60 mL/min (ref >60)
Glucose, Bld: 124 mg/dL — ABNORMAL HIGH (ref 70–99)
Glucose, Bld: 185 mg/dL — ABNORMAL HIGH (ref 70–99)
Potassium: 5.4 mmol/L — ABNORMAL HIGH (ref 3.5–5.1)
Potassium: 5.2 mmol/L — ABNORMAL HIGH (ref 3.5–5.1)
Sodium: 151 mmol/L — ABNORMAL HIGH (ref 135–145)
Sodium: 149 mmol/L — ABNORMAL HIGH (ref 135–145)

## 2020-09-24 LAB — CBC
HCT: 27.6 % — ABNORMAL LOW (ref 39.0–52.0)
HCT: 28.3 % — ABNORMAL LOW (ref 39.0–52.0)
HCT: 24.3 % — ABNORMAL LOW (ref 39.0–52.0)
Hemoglobin: 8.4 g/dL — ABNORMAL LOW (ref 13.0–17.0)
Hemoglobin: 8.6 g/dL — ABNORMAL LOW (ref 13.0–17.0)
Hemoglobin: 7.4 g/dL — ABNORMAL LOW (ref 13.0–17.0)
MCH: 29.9 pg (ref 26.0–34.0)
MCH: 29.7 pg (ref 26.0–34.0)
MCH: 30.1 pg (ref 26.0–34.0)
MCHC: 30.4 g/dL (ref 30.0–36.0)
MCHC: 30.4 g/dL (ref 30.0–36.0)
MCHC: 30.5 g/dL (ref 30.0–36.0)
MCV: 98.2 fL (ref 80.0–100.0)
MCV: 97.6 fL (ref 80.0–100.0)
MCV: 98.8 fL (ref 80.0–100.0)
Platelets: 151 10*3/uL (ref 150–400)
Platelets: 145 10*3/uL — ABNORMAL LOW (ref 150–400)
Platelets: 125 10*3/uL — ABNORMAL LOW (ref 150–400)
RBC: 2.81 MIL/uL — ABNORMAL LOW (ref 4.22–5.81)
RBC: 2.9 MIL/uL — ABNORMAL LOW (ref 4.22–5.81)
RBC: 2.46 MIL/uL — ABNORMAL LOW (ref 4.22–5.81)
RDW: 18.6 % — ABNORMAL HIGH (ref 11.5–15.5)
RDW: 18.2 % — ABNORMAL HIGH (ref 11.5–15.5)
RDW: 18.6 % — ABNORMAL HIGH (ref 11.5–15.5)
WBC: 11.4 10*3/uL — ABNORMAL HIGH (ref 4.0–10.5)
WBC: 9.4 10*3/uL (ref 4.0–10.5)
WBC: 7.4 10*3/uL (ref 4.0–10.5)
nRBC: 0 % (ref 0.0–0.2)
nRBC: 0 % (ref 0.0–0.2)
nRBC: 0 % (ref 0.0–0.2)

## 2020-09-24 LAB — GLUCOSE, CAPILLARY
Glucose-Capillary: 112 mg/dL — ABNORMAL HIGH (ref 70–99)
Glucose-Capillary: 144 mg/dL — ABNORMAL HIGH (ref 70–99)
Glucose-Capillary: 114 mg/dL — ABNORMAL HIGH (ref 70–99)
Glucose-Capillary: 170 mg/dL — ABNORMAL HIGH (ref 70–99)
Glucose-Capillary: 136 mg/dL — ABNORMAL HIGH (ref 70–99)

## 2020-09-24 LAB — APTT
aPTT: 61 seconds — ABNORMAL HIGH (ref 24–36)
aPTT: 52 seconds — ABNORMAL HIGH (ref 24–36)
aPTT: 41 seconds — ABNORMAL HIGH (ref 24–36)
aPTT: 46 seconds — ABNORMAL HIGH (ref 24–36)

## 2020-09-24 LAB — PREPARE RBC (CROSSMATCH)

## 2020-09-24 LAB — PROTIME-INR
INR: 1.6 — ABNORMAL HIGH (ref 0.8–1.2)
Prothrombin Time: 18.6 seconds — ABNORMAL HIGH (ref 11.4–15.2)

## 2020-09-24 LAB — TRIGLYCERIDES: Triglycerides: 48 mg/dL (ref <150)

## 2020-09-24 LAB — HEPATIC FUNCTION PANEL
ALT: 71 U/L — ABNORMAL HIGH (ref 0–44)
AST: 50 U/L — ABNORMAL HIGH (ref 15–41)
Albumin: 2.8 g/dL — ABNORMAL LOW (ref 3.5–5.0)
Alkaline Phosphatase: 137 U/L — ABNORMAL HIGH (ref 38–126)
Bilirubin, Direct: 0.8 mg/dL — ABNORMAL HIGH (ref 0.0–0.2)
Indirect Bilirubin: 1.1 mg/dL — ABNORMAL HIGH (ref 0.3–0.9)
Total Bilirubin: 1.9 mg/dL — ABNORMAL HIGH (ref 0.3–1.2)
Total Protein: 4.8 g/dL — ABNORMAL LOW (ref 6.5–8.1)

## 2020-09-24 LAB — LACTATE DEHYDROGENASE: LDH: 548 U/L — ABNORMAL HIGH (ref 98–192)

## 2020-09-24 LAB — TSH: TSH: 3.277 u[IU]/mL (ref 0.350–4.500)

## 2020-09-24 LAB — AMMONIA: Ammonia: 36 umol/L — ABNORMAL HIGH (ref 9–35)

## 2020-09-24 LAB — FIBRINOGEN: Fibrinogen: 352 mg/dL (ref 210–475)

## 2020-09-24 LAB — LACTIC ACID, PLASMA
Lactic Acid, Venous: 0.8 mmol/L (ref 0.5–1.9)
Lactic Acid, Venous: 1 mmol/L (ref 0.5–1.9)

## 2020-09-24 MED ORDER — NYSTATIN 100000 UNIT/ML MT SUSP
5.0000 mL | Freq: Four times a day (QID) | OROMUCOSAL | Status: DC
  Administered 2020-09-24 – 2020-09-25 (×7): 500000 [IU] via ORAL
  Filled 2020-09-24 (×7): qty 5

## 2020-09-24 MED ORDER — SODIUM BICARBONATE 8.4 % IV SOLN
INTRAVENOUS | Status: AC
  Filled 2020-09-24: qty 50

## 2020-09-24 MED ORDER — SODIUM CHLORIDE 0.9 % IV SOLN
0.0400 mg/kg/h | INTRAVENOUS | Status: DC
  Administered 2020-09-24 – 2020-09-25 (×2): 0.05 mg/kg/h via INTRAVENOUS
  Administered 2020-09-26: 15:00:00 0.04 mg/kg/h via INTRAVENOUS
  Administered 2020-09-29: 11:00:00 0.03 mg/kg/h via INTRAVENOUS
  Administered 2020-10-02 – 2020-10-05 (×2): 0.04 mg/kg/h via INTRAVENOUS
  Filled 2020-09-24 (×6): qty 250

## 2020-09-24 MED ORDER — EPINEPHRINE 1 MG/10ML IJ SOSY
PREFILLED_SYRINGE | INTRAMUSCULAR | Status: AC
  Filled 2020-09-24: qty 20

## 2020-09-24 MED ORDER — IOHEXOL 300 MG/ML  SOLN
100.0000 mL | Freq: Once | INTRAMUSCULAR | Status: AC | PRN
  Administered 2020-09-24: 100 mL via INTRAVENOUS

## 2020-09-24 MED ORDER — VANCOMYCIN HCL 1500 MG/300ML IV SOLN
1500.0000 mg | Freq: Once | INTRAVENOUS | Status: AC
  Administered 2020-09-24: 11:00:00 1500 mg via INTRAVENOUS
  Filled 2020-09-24: qty 300

## 2020-09-24 MED ORDER — VANCOMYCIN HCL IN DEXTROSE 1-5 GM/200ML-% IV SOLN
1000.0000 mg | Freq: Two times a day (BID) | INTRAVENOUS | Status: AC
  Administered 2020-09-24 – 2020-09-30 (×13): 1000 mg via INTRAVENOUS
  Filled 2020-09-24 (×13): qty 200

## 2020-09-24 MED ORDER — SODIUM CHLORIDE 0.9% IV SOLUTION: Freq: Once | INTRAVENOUS | Status: AC

## 2020-09-24 MED ORDER — SODIUM ZIRCONIUM CYCLOSILICATE 5 G PO PACK
5.0000 g | PACK | Freq: Once | ORAL | Status: AC
  Administered 2020-09-24: 09:00:00 5 g
  Filled 2020-09-24: qty 1

## 2020-09-24 MED ORDER — ALBUMIN HUMAN 5 % IV SOLN
INTRAVENOUS | Status: AC
  Filled 2020-09-24: qty 250

## 2020-09-24 MED ORDER — PHENYLEPHRINE 40 MCG/ML (10ML) SYRINGE FOR IV PUSH (FOR BLOOD PRESSURE SUPPORT)
PREFILLED_SYRINGE | INTRAVENOUS | Status: AC
  Filled 2020-09-24: qty 10

## 2020-09-24 MED ORDER — CALCIUM CHLORIDE 10 % IV SOLN
INTRAVENOUS | Status: AC
  Filled 2020-09-24: qty 10

## 2020-09-24 MED ORDER — SODIUM CHLORIDE 0.9 % IV SOLN
2.0000 g | Freq: Three times a day (TID) | INTRAVENOUS | Status: AC
  Administered 2020-09-24 – 2020-09-30 (×20): 2 g via INTRAVENOUS
  Filled 2020-09-24 (×20): qty 2

## 2020-09-24 MED ORDER — ACETAZOLAMIDE 250 MG PO TABS
250.0000 mg | ORAL_TABLET | Freq: Two times a day (BID) | ORAL | Status: AC
  Administered 2020-09-24 (×2): 250 mg
  Filled 2020-09-24 (×2): qty 1

## 2020-09-24 MED ORDER — PROPOFOL 1000 MG/100ML IV EMUL
INTRAVENOUS | Status: AC
  Administered 2020-09-24: 19:00:00 5 ug/kg/min via INTRAVENOUS
  Filled 2020-09-24: qty 100

## 2020-09-24 MED ORDER — PROPOFOL 1000 MG/100ML IV EMUL
5.0000 ug/kg/min | INTRAVENOUS | Status: DC
  Administered 2020-09-24: 19:00:00 5 ug/kg/min via INTRAVENOUS
  Administered 2020-09-25 (×2): 40 ug/kg/min via INTRAVENOUS
  Administered 2020-09-25: 16:00:00 15 ug/kg/min via INTRAVENOUS
  Administered 2020-09-25: 23:00:00 20 ug/kg/min via INTRAVENOUS
  Administered 2020-09-26: 17:00:00 15 ug/kg/min via INTRAVENOUS
  Administered 2020-09-26: 04:00:00 40 ug/kg/min via INTRAVENOUS
  Administered 2020-09-26: 20 ug/kg/min via INTRAVENOUS
  Administered 2020-09-26: 08:00:00 30 ug/kg/min via INTRAVENOUS
  Administered 2020-09-27: 18:00:00 10 ug/kg/min via INTRAVENOUS
  Administered 2020-09-27: 06:00:00 40 ug/kg/min via INTRAVENOUS
  Administered 2020-09-28: 05:00:00 10 ug/kg/min via INTRAVENOUS
  Filled 2020-09-24 (×11): qty 100

## 2020-09-24 NOTE — Progress Notes (Signed)
NAME:  Donald Rowe, MRN:  [id number], DOB:  Jun 28, 1961, LOS: 14 ADMISSION DATE:  09/26/2020, CONSULTATION DATE:  12/6 REFERRING MD:  Dr. Aileen Fass, CHIEF COMPLAINT:  SOB    Brief History   59 y/o M admitted 12/4 with 1 week hx of SOB, known COVID positive.  He is unvaccinated.  CTA chest negative for PE but demonstrated diffuse bilateral infiltrates. [place] started on 12/8   Past Medical History  Hypothyroidism  [facility] [facility] Events/Procedures  12/04 Admit  12/06 PCCM consulted  12/07 Intubated, central line, a line 12/08 VV ECMO Cannulaton, cortrak 12/13 circuit change for hemolytic anemia 12/16 Tracheostomy  12/17 bronch w/ BAL  Consults:  PCCM, Heart failure, TCTS, ECMO  Significant Diagnostic Tests:   CTA Chest 12/4 >> extensive bilateral airspace disease, no large PE identified, limited study   LE Venous Duplex 12/4 >> negative for DVT bilaterally   LE venous duplex 12/8> BLE DVTs  RUE venous duplex 12/23  RUE arterial duplex 12/23  CT head 12/26 : Minimal SAH on left side  CT abdomen and pelvis 12/26: Extensive bilateral airspace disease with small pleural effusion  Micro Data:  COVID 12/4 >> negative  Influenza A/B 12/4 >> negative  MRSA PCR 12/5 >> negative  BCx2 12/4 >> NG Trach aspirate 12/12> rare MRSA BAL 12/17> MRSA, candida Aspergillus Ag BAL 12/17> 0.05 12/18 sars2: positive  Antimicrobials:  Azithromycin 12/5 >> 12/6  Ceftriaxone 12/5 >> 12/6  Cefepime 12/7>12/14 vanc 12/14>12/20 Meropenem 12/17>12/21  Interim history/subjective:  Patient had 2 episodes of abrupt hypotension yesterday with drop in hemoglobin at the same time then it is stabilized.  He received 2 units of PRBCs last 24 hours.  Overnight hypercapnia and hypoxemia is getting worse, requiring increasing sleep on ECMO.  Objective   Blood pressure 127/61, pulse 84, temperature (!) 97 F (36.1 C), temperature source Core (Comment), resp. rate (!) 26, height _0   (1.753 m), weight 108.5 kg, SpO2 93 %.    Vent Mode: PCV FiO2 (%):  [40 %] 40 % Set Rate:  [12 bmp] 12 bmp PEEP:  [10 cmH20] 10 cmH20 Plateau Pressure:  [32 cmH20] 32 cmH20   Intake/Output Summary (Last 24 hours) at 09/24/2020 1128 Last data filed at 09/24/2020 1036 Gross per 24 hour  Intake 6495.61 ml  Output 4160 ml  Net 2335.61 ml   Filed Weights   09/22/20 0600 09/23/20 0600 09/24/20 0600  Weight: 109.2 kg 111.9 kg 108.5 kg    Examination: Gen: Well-developed, acutely ill-appearing obese male, lying on the bed  HEENT: NCAT, perrla, s/p trach Neck: supple, ECMO lines intact and functioning CV: Tachycardic, regular rhythm, no murmur  Pulm: Bilateral faint crackles, left more than right, no wheezes or rhonchi Abd: Soft, BS+, NTND Extm: Peripheral pulses intact 2+ radial pulse on RUE, 1+ edema Skin: Dry, Warm, small areas of ecchymois throughout B/L fingers with some mottling in 1-2 digits R>>>L Neuro: Eyes closed, does not open, not following commands.  Nonpurposefully moving all 4 extremities.  Pupils 3 mm minimally reactive  Resolved Hospital Problem list     Assessment & Plan:   Acute hypoxemic and hypercarbic respiratory failure secondary to COVID PNA with severe ARDS. S/p VV ECMO cannulation 09/08/2020.  S/p tracheostomy 56/43 complicated by trach site bleeding improved Left-sided subarachnoid hemorrhage Community-acquired pneumonia with MRSA  Completed remdesivir.  Completed baricitinib.  Appreciate heart failure recs c/w full ECMO support Patient's gas exchange (PaO2 and PCO2) are trending, requiring increasing sweep and  RPM on ECMO Lung ultrasound showed dense left lower lobe infiltrates, minimal effusion Continue Solu-Medrol 40 mg once daily, will plan for slow taper Continue routine trach care VAP prevention protocol Completed for treatment with 4 sessions of Plex Continue diuresis with Lasix 40 mg once daily, received extra dose of 40 mg this morning Stable  pneumomediastinum noted on cxr no ptx appreciated Sweep on ECMO is 5, will try to to titrate down as tolerated LDH is slowly trending up but remain in the 500s Holding bivalved because CT head showed minimal left-sided [place] Patient had completed treatment with vancomycin and meropenem for pneumonia but as his oxygenation and CO2 are getting worse, with uptrending leukocytosis will restart antibiotics with vancomycin  Agitated delirium requiring titration of IV sedation No improvement in mental status, patient continued remain agitated Continue Precedex and fentanyl infusion Minimize sedation with RASS goal 1 and tolerance of MV & ECMO Con't seroquel, oxycodone and clonazepam  Bilateral lower ext DVTs- provoked by COVID infection Bilateral is on hold  Steroid-induced hyperglycemia Fingersticks are better controlled now Continue Levemir 25 units Continue TF coverage (but decreasing) and SSI PRN  Hypothyroidism con't PTA synthroid   Labile hypertension, contributed by periods of agitations Patient blood pressure remained labile ranging  Currently on clonidine, hydralazine and metoprolol Started on Cardene infusion  Hemolytic Anemia Throbocytopenia Hyperbilirubinemia Rheumatologic-associated MAHA remains in differential.  hgb and plt are stable Completed treatment with the Plex for 4 sessions Hemoglobin dropped to 7.4, received 2 PRBCs in last 24 hours Transfuse if hgb <8  Hypernatremia and hyperchloremia Azotemia Continue free water 100 cc an hour  Strict I/Os  Plan discussed with Palliative Care, Cardiology, ECMO specialist, RN, pharmacy at beside.    Best practice (evaluated daily)  Diet: TF Pain/Anxiety/Delirium protocol (if indicated): Precedex and fentanyl VAP protocol (if indicated): yes DVT prophylaxis: Bivalirudin GI prophylaxis: pantoprazole  BID Glucose control: basal-bolus Mobility: As tolerated Last date of multidisciplinary goals of care discussion: wife  updated over the phone 12/23 Summary of discussion con't aggressive care Follow up goals of care discussion due: 12/30 Code Status: full Disposition: ICU   Total critical care time: 37 minutes   Critical care time was exclusive of separately billable procedures and treating other patients.   Critical care was necessary to treat or prevent imminent or life-threatening deterioration.   Critical care was time spent personally by me on the following activities: development of treatment plan with patient and/or surrogate as well as nursing, discussions with consultants, evaluation of patient's response to treatment, examination of patient, obtaining history from patient or surrogate, ordering and performing treatments and interventions, ordering and review of laboratory studies, ordering and review of radiographic studies, pulse oximetry and re-evaluation of patient's condition.   Jacky Kindle MD [facility] Pager: [phone] Mobile: [phone]

## 2020-09-24 NOTE — Progress Notes (Signed)
Patient ID: Donald Rowe, male   DOB: 1961/01/31, 59 y.o.   MRN: [id number]    Advanced Heart Failure Rounding Note   Subjective:    12/8 Cannulated for VV ECMO - 32 FR  RIJ [place] 12/9 Extubated/Reintubated for altered mental status 12/13 Circuit change for elevated LDH 12/15 PLEX started 12/16 Tracheostomy 12/23 head CT (normal) for change in mental status.   2 episodes yesterday of abrupt hypotension with drop in hgb at the same time. Then stabilized. Got 2u RBCs throughout the day. Overnight oxygenation worse. Sweep up to 5. Post-oxygenator gas ok.   CXR looks worse this am. More fluffy. No hemoptysis.No other evidence of bleeding. BP remains labile on nicardipine  Lung u/s with LLL infiltrate  Had PLEX 4/4 12/22 LDH 434 -> 544 -> 419 -> 551 -> 548.   ECMO   Speed 3750 Flow 4.4L Sweep 5 dP 21 pVen  -79  ABG: 7.31/67/58/86% Hgb 8.4 LDH 434 -> 544 -> 419 -> 551 -> 579 -> 590 Lactic acid 0.8 PTT 61  Vent 40% TVs 250-300    Objective:   Weight Range:  Vital Signs:   Temp:  [98.06 F (36.7 C)-98.78 F (37.1 C)] 98.24 F (36.8 C) (12/26 0700) Pulse Rate:  [71-113] 100 (12/26 0700) Resp:  [18-40] 29 (12/26 0700) BP: (74-184)/(33-79) 144/56 (12/26 0700) SpO2:  [87 %-99 %] 88 % (12/26 0700) Arterial Line BP: (74-209)/(26-69) 155/47 (12/26 0700) FiO2 (%):  [40 %] 40 % (12/26 0425) Weight:  [108.5 kg] 108.5 kg (12/26 0600) Last BM Date: 09/23/20  Weight change: Filed Weights   09/22/20 0600 09/23/20 0600 09/24/20 0600  Weight: 109.2 kg 111.9 kg 108.5 kg    Intake/Output:   Intake/Output Summary (Last 24 hours) at 09/24/2020 0755 Last data filed at 09/24/2020 0700 Gross per 24 hour  Intake 6154.39 ml  Output 4985 ml  Net 1169.39 ml     Physical Exam: General:  Sedated on vent HEENT: normal Neck: +RIJ ECMP cannula. + trach. Cor: PMI nondisplaced. Regular tachy Lungs: coarse Abdomen: obese soft, nontender, nondistended. No hepatosplenomegaly. No  bruits or masses. Good bowel sounds. Extremities: no cyanosis, clubbing, rash, 1+ edema Neuro: intubated sedated  Telemetry: sinus 100-115 Personally reviewed    Labs: Basic Metabolic Panel: Recent Labs  Lab 09/22/20 0335 09/22/20 0345 09/22/20 1536 09/22/20 1538 09/23/20 0415 09/23/20 0421 09/23/20 1541 09/23/20 1837 09/23/20 2115 09/24/20 0349 09/24/20 0356 09/24/20 0509 09/24/20 0639  NA 150*   < > 151*   < > 152*   < > 149*   < > 151* 151* 152* 151* 151*  K 4.8   < > 5.4*   < > 4.9   < > 5.5*   < > 5.2* 5.4* 4.9 5.1 5.3*  CL 113*  --  112*  --  115*  --  111  --   --  113*  --   --   --   CO2 30  --  31  --  31  --  31  --   --  31  --   --   --   GLUCOSE 154*  --  160*  --  130*  --  144*  --   --  124*  --   --   --   BUN 91*  --  89*  --  81*  --  85*  --   --  92*  --   --   --   CREATININE 1.15  --  1.08  --  1.00  --  1.00  --   --  1.07  --   --   --   CALCIUM 8.4*  --  8.7*  --  8.5*  --  8.2*  --   --  8.3*  --   --   --    < > = values in this interval not displayed.    Liver Function Tests: Recent Labs  Lab 09/20/20 0322 09/21/20 0411 09/22/20 0335 09/23/20 0415 09/24/20 0349  AST 58* 51* 49* 67* 50*  ALT 77* 68* 62* 86* 71*  ALKPHOS 66 81 100 140* 137*  BILITOT 2.9* 1.7* 1.9* 1.7* 1.9*  PROT 4.3* 4.8* 4.7* 4.9* 4.8*  ALBUMIN 3.5 3.2* 2.9* 2.8* 2.8*   No results for input(s): LIPASE, AMYLASE in the last 168 hours. No results for input(s): AMMONIA in the last 168 hours.  CBC: Recent Labs  Lab 09/23/20 0953 09/23/20 1323 09/23/20 1325 09/23/20 1541 09/23/20 1837 09/23/20 2113 09/23/20 2115 09/24/20 0349 09/24/20 0356 09/24/20 0509 09/24/20 0639  WBC 7.5 12.6*  --  11.1*  --  8.3  --  11.4*  --   --   --   HGB 7.1* 8.6*   < > 8.5*   < > 7.4* 7.1* 8.4* 7.8* 7.5* 7.5*  HCT 23.8* 28.7*   < > 26.6*   < > 22.9* 21.0* 27.6* 23.0* 22.0* 22.0*  MCV 99.6 98.0  --  96.4  --  96.6  --  98.2  --   --   --   PLT 124* 148*  --  151  --  140*  --   151  --   --   --    < > = values in this interval not displayed.    Cardiac Enzymes: No results for input(s): CKTOTAL, CKMB, CKMBINDEX, TROPONINI in the last 168 hours.  BNP: BNP (last 3 results) No results for input(s): BNP in the last 8760 hours.  ProBNP (last 3 results) No results for input(s): PROBNP in the last 8760 hours.    Other results:  Imaging: DG CHEST PORT 1 VIEW  Result Date: 09/23/2020 CLINICAL DATA:  ECMO. EXAM: PORTABLE CHEST 1 VIEW COMPARISON:  09/22/2020 FINDINGS: 0704 hours. Tracheostomy and feeding tubes again noted. ECMO cannula overlies the medial right chest. Right PICC line has been removed in the interval. Diffuse bilateral airspace disease ease again noted. Prominent pneumomediastinum persists. Left PICC line is new in the interval with tip overlying the SVC/RA junction. IMPRESSION: *New left PICC line tip overlies the SVC/RA junction. *Interval removal of right PICC line. *Persistent diffuse bilateral airspace disease with pneumomediastinum. Electronically Signed   By: Misty Stanley M.D.   On: 09/23/2020 08:41   [place] EKG SITE RITE  Result Date: 09/22/2020 If Site Rite image not attached, placement could not be confirmed due to current cardiac rhythm.    Medications:     Scheduled Medications: . amLODipine  10 mg Per Tube QHS  . aspirin  81 mg Per Tube Daily  . calcium gluconate  2 g Intravenous Once  . chlorhexidine gluconate (MEDLINE KIT)  15 mL Mouth Rinse BID  . Chlorhexidine Gluconate Cloth  6 each Topical Daily  . clonazePAM  2 mg Per Tube BID  . cloNIDine  0.2 mg Per Tube TID  . docusate  100 mg Per Tube BID  . feeding supplement (PROSource TF)  45 mL Per Tube TID  . fiber  1 packet Per Tube BID  .  free water  100 mL Per Tube Q1H  . furosemide  40 mg Intravenous Daily  . hydrALAZINE  25 mg Per Tube TID AC  . insulin aspart  2-6 Units Subcutaneous Q4H  . insulin aspart  5 Units Subcutaneous Q4H  . insulin detemir  25 Units Subcutaneous  Q12H  . levothyroxine  50 mcg Per Tube Q0600  . mouth rinse  15 mL Mouth Rinse 10 times per day  . methylPREDNISolone (SOLU-MEDROL) injection  40 mg Intravenous Q24H  . metoprolol tartrate  50 mg Per Tube TID PC  . oxyCODONE  10 mg Per Tube Q6H  . pantoprazole sodium  40 mg Per Tube BID  . pravastatin  40 mg Per Tube q1800  . QUEtiapine  100 mg Per Tube BID  . sodium chloride flush  10-40 mL Intracatheter Q12H    Infusions: . sodium chloride Stopped (09/23/20 0043)  . sodium chloride 10 mL/hr at 09/20/20 1900  . albumin human    . albumin human 12.5 g (09/23/20 0857)  . bivalirudin (ANGIOMAX) infusion 0.5 mg/mL (Non-ACS indications) 0.09 mg/kg/hr (09/24/20 0700)  . citrate dextrose    . dexmedetomidine (PRECEDEX) IV infusion 0.8 mcg/kg/hr (09/24/20 0700)  . dextrose    . feeding supplement (PIVOT 1.5 CAL) 1,000 mL (09/23/20 2059)  . fentaNYL infusion INTRAVENOUS 100 mcg/hr (09/24/20 0700)  . niCARDipine 5 mg/hr (09/24/20 0700)  . norepinephrine (LEVOPHED) Adult infusion Stopped (09/23/20 2210)    PRN Medications: sodium chloride, acetaminophen (TYLENOL) oral liquid 160 mg/5 mL, acetaminophen, albumin human, albuterol, dextrose, diphenhydrAMINE, diphenhydrAMINE, fentaNYL, Gerhardt's butt cream, heparin, labetalol, lip balm, LORazepam, ondansetron **OR** ondansetron (ZOFRAN) IV, phenylephrine, polyethylene glycol, sodium chloride flush   Assessment/Plan:   1. Acute hypoxic respiratory failure/ARDS in setting of COVID-19 PNA - Cannulated for VV ECMO on 12/8 after failing intubation x 2 days - has complete remedisivir, baricitinib, Solumedrol.  - s/p trach 12/16 - has diuresed well. Lasix cut back to daily several days ago. Weight stabile but CXR worse  - Respiratory status worse. Oxygenation worse. Requiring more sweep. TVs lower. Chest u/s with LLL infiltrate - Also appears to be having bleeding episodes and required 2u RBCs yesterday.  - Will pan-culture, restart abx, CT  head/C/A/P today (Mero completed 12/21, vanc complete 12/20) - circuit changed 12/13 for elevated LDH and lower flows. PLEX started 12/15. S/p PLEX #4/4 on 12/21. LDH stable - PTT goal turned down to 50-60 due to bleeding. Improved. Circuit looks ok. PTT 61 today. Discussed dosing with PharmD personally. - Mental status remains a major issue. Meds adjusted. Remains on precedex  2. Bilateral DVT - remains on bival - Dosing d/w PharmD - no change  3. DM2 - Insulin adjusted. CBGs improved   4. Obesity -Body mass index is 33.73 kg/m.  5. F/E/N - Stable 151 - Free water adjusted  6. HTN - Remains very labile  - Now on nicardipine gtt  7. RUE skin breakdown around PICC site - PICC switched to left  8. AKI - diuresing briskly. BUN/CR imroved - increase lasix to 40 bid today  9. Acute encephalopathy - remains agitated  - head CT ok 09/07/20 and 12/21 - switched to Precedex  Plan discussed on multi-discplinary ECMO rounds with CCM, ECMO coordinator/specialist, PharmDs and RNs.  CRITICAL CARE Performed by: Glori Bickers  CRITICAL CARE Performed by: Glori Bickers  Total critical care time: 50 minutes  Critical care time was exclusive of separately billable procedures and treating other patients.  Critical care was  necessary to treat or prevent imminent or life-threatening deterioration.  Critical care was time spent personally by me (independent of midlevel providers or residents) on the following activities: development of treatment plan with patient and/or surrogate as well as nursing, discussions with consultants, evaluation of patient's response to treatment, examination of patient, obtaining history from patient or surrogate, ordering and performing treatments and interventions, ordering and review of laboratory studies, ordering and review of radiographic studies, pulse oximetry and re-evaluation of patient's condition.    Length of Stay: [place] MD 09/24/2020, 7:55 AM  Advanced Heart Failure Team Pager [phone] (M-F; [place])  Please contact [facility] for night-coverage after hours (4p -7a ) and weekends on [email or web address]

## 2020-09-24 NOTE — Progress Notes (Signed)
ANTICOAGULATION CONSULT NOTE  Pharmacy Consult for bivalirudin Indication: ECMO + bilateral DVTs   Recent Labs    09/22/20 0335 09/22/20 0345 09/22/20 1536 09/22/20 1538 09/23/20 0415 09/23/20 0421 09/23/20 1541 09/23/20 1837 09/23/20 2113 09/23/20 2115 09/24/20 0349  HGB 8.3*   < > 8.1*   < > 8.4*   < > 8.5*   < > 7.4* 7.1* 8.4*  HCT 27.4*   < > 25.5*   < > 27.2*   < > 26.6*   < > 22.9* 21.0* 27.6*  PLT 154   < > 151   < > 166   < > 151  --  140*  --  151  APTT 56*  --  61*  --  57*  --  56*  --   --   --  61*  LABPROT 19.1*  --   --   --  18.2*  --   --   --   --   --  18.6*  INR 1.7*  --   --   --  1.6*  --   --   --   --   --  1.6*  CREATININE 1.15  --  1.08  --  1.00  --  1.00  --   --   --   --    < > = values in this interval not displayed.    Estimated Creatinine Clearance: 98.1 mL/min (by C-G formula based on SCr of 1 mg/dL).   Assessment: 89 yom unvaccinated presenting with severe COVID ARDs, intubated on 12/6, continued to have ventilation issues despite NMB and proning - started on VV ECMO on 12/8.  D-dimer >20 > now improved 14, found to have bilateral DVTs.  Given significant bleeding issues from trach, targeting lower aPTT goal of 50-55 seconds after discussion with ECMO team; but bleeding now improving, so changed goal to 50-60 seconds.   aPTT this morning is slightly above goal at 61 sec on bivalirudin drip 0.1 mg/kg/hr.  Hgb down to 7.1 last night, s/p PRBC x2 12/25 and PRBC  x1 12/26. No overt bleeding, plt stable.   Goal of Therapy:  aPTT 50-60 seconds Monitor platelets by anticoagulation protocol: Yes   Plan:  Decrease IV bivalirudin to 0.09 mg/kg/hr  Check q12h aPTT Follow bleeding, CBC  Christoper Fabian, PharmD, BCPS Please see amion for complete clinical pharmacist phone list 09/24/2020 4:34 AM

## 2020-09-24 NOTE — Progress Notes (Signed)
  Patient sent for STAT CT due dropping hgb and worsening overall condition.  CTs reviewed personally and show   1. Very small SAH in he Sylvian fissure 2. Severe pneumomediastinum with widespread, severe ARDS/parenchymal lung damage 3. No evidence of acute bleeding in the chest or abdomen to explain drop in hgb  I consulted Neurosurgery and reviewed the CT Head with Dr. Maisie Fus who agreed [facility] is very small and requires only conservative management at this point. Will stop bival for 2 hours and restart with goal PTT 40-50. Repeat CT brain in am (or sooner if clinical deterioration). D/w ECMO specialist and PharmD.  I am concerned about lack of lung recovery at this point in his disease course.  Will continue intensive management. Situation d/w Dr. Merrily Pew (CCM) as well.   I called his wife's cell phone number and left a VM for her to call me back as soon as possible.   Additional CCT > 60 mins.   Arvilla Meres, MD  11:59 AM

## 2020-09-24 NOTE — Progress Notes (Signed)
ANTICOAGULATION CONSULT NOTE  Pharmacy Consult for bivalirudin Indication: ECMO + bilateral DVTs   Recent Labs    09/22/20 0335 09/22/20 0345 09/23/20 0415 09/23/20 0421 09/23/20 1541 09/23/20 1837 09/23/20 2113 09/23/20 2115 09/24/20 0349 09/24/20 0356 09/24/20 1139 09/24/20 1309 09/24/20 1349 09/24/20 1431  HGB 8.3*   < > 8.4*   < > 8.5*   < > 7.4*   < > 8.4*   < > 7.1* 7.5*  --  7.8*  HCT 27.4*   < > 27.2*   < > 26.6*   < > 22.9*   < > 27.6*   < > 21.0* 22.0*  --  23.0*  PLT 154   < > 166   < > 151  --  140*  --  151  --   --   --   --   --   APTT 56*   < > 57*  --  56*  --   --   --  61*  --   --   --  52*  --   LABPROT 19.1*  --  18.2*  --   --   --   --   --  18.6*  --   --   --   --   --   INR 1.7*  --  1.6*  --   --   --   --   --  1.6*  --   --   --   --   --   CREATININE 1.15   < > 1.00  --  1.00  --   --   --  1.07  --   --   --   --   --    < > = values in this interval not displayed.    Estimated Creatinine Clearance: 90.2 mL/min (by C-G formula based on SCr of 1.07 mg/dL).   Assessment: 79 yom unvaccinated presenting with severe COVID ARDs, intubated on 12/6, continued to have ventilation issues despite NMB and proning - started on VV ECMO on 12/8.  D-dimer >20 > now improved 14, found to have bilateral DVTs.   Has been on bivalirudin drip 0.1mg /kg/hr with aptts at low end of goal 50-60.   S/p Head CT 12/26 with new small SAH.  Held bivalirudin x3hr and resume at lower rate with lower goal 40-50sec until follow up CT to ensure not expanding.     Goal of Therapy:  aPTT 40-50 seconds (new 12/26)  Monitor platelets by anticoagulation protocol: Yes   Plan:  Decrease IV bivalirudin to 0.05 mg/kg/hr  Draw aptt 4hr after restart thenCheck q12h aPTT Follow bleeding, CBC    Leota Sauers Pharm.D. CPP, BCPS Clinical Pharmacist [phone] 09/24/2020 3:35 PM

## 2020-09-24 NOTE — Progress Notes (Addendum)
Extracorporeal support note     ECLS cannulation: 09/13/2020 Last circuit change: 09/11/2020  Indication: COVD ARDS   Configuration: VV, RIJ 32Fr [place]   Pump speed: 3800 Pump flow: 4.4L  Pump used: Cardiohelp   Sweep gas: 100%,5LPM   Circuit check: 2 small clots at the corner were noted Anticoagulant: Holding bivalved for tiny subarachnoid on left side   Changes in support: RPM has to be increased to maintain blood flow and oxygenation.  Also sleep was increased from 4 to 5 L/min to clear hypercapnia. Post oxygenator ABG showed PaO2 378   Anticipated goals/duration of support: bridge to recovery   Cheri Fowler MD [facility] Pager: [phone] Mobile: [phone]

## 2020-09-24 NOTE — Progress Notes (Signed)
Patient transported to CT scan with bedside RN, RT, ECMO specialist and Perfusionist [facility]).  Patient removed from wall O2 and placed on tank at 6 Lpm; removed from wall power to battery (4hours on backup).  Transported to CT1.  Head CT completed; when moving patient from CT table back to the bed pump flows lost, I traced the circuit and found the pump head had decoupled from the cardiohelp.  Immediately made sure RPMs were at 0 and reseated the pump head, flows restored and back on ECMO; approximate time off ECMO 15sec.  No changed to patients VS during this time.  Returned to room placed back to red outlet wall power and wall O2.  Heater warmed and turned back on.

## 2020-09-24 NOTE — Progress Notes (Signed)
PIV requested for stat CT scan.  Pt has Power injectable PICC line that can be used for this.  All meds are compatible to be able to use the appropriate line.  Rn notified - agree no need for PIV at this time for CT contrast.

## 2020-09-24 NOTE — Progress Notes (Addendum)
Pharmacy Antibiotic Note  Donald Rowe is a 59 y.o. male admitted on 09/22/2020 with COVID.  Cannulated for ECMO 12/8.  S/p tx vancomycin for MRSA pna last dose 12/20.  New infiltrate LLL, increase WBC11, Cr 1 crcl 40m/min. Plan pan cx.   Pharmacy has been consulted for vancomycin and cefepime dosing.      Plan: Vancomycin 1508mIV x1 then 1gm IV q12 hrs. Cefepime 2gm IV q8h Will watch renal function closely and will adjust dosing as needed Monitor cx results, clinical pic, renal fx   Height: _0  (175.3 cm) Weight: 108.5 kg (239 lb 3.2 oz) IBW/kg (Calculated) : 70.7  Temp (24hrs), Avg:98.3 F (36.8 C), Min:98.06 F (36.7 C), Max:98.78 F (37.1 C)  Recent Labs  Lab 09/18/20 0312 09/18/20 0436 09/22/20 0335 09/22/20 1216 09/22/20 1536 09/22/20 2340 09/23/20 0415 09/23/20 0953 09/23/20 1323 09/23/20 1541 09/23/20 2113 09/24/20 0349  WBC  --    < > 13.0*   < > 11.3*   < > 12.7* 7.5 12.6* 11.1* 8.3 11.4*  CREATININE 1.35*   < > 1.15  --  1.08  --  1.00  --   --  1.00  --  1.07  LATICACIDVEN 1.6   < > 0.8  --  0.8  --  0.9  --   --  0.8  --  0.8  VANCOTROUGH 20  --   --   --   --   --   --   --   --   --   --   --    < > = values in this interval not displayed.    Estimated Creatinine Clearance: 90.2 mL/min (by C-G formula based on SCr of 1.07 mg/dL).    No Known Allergies  Antimicrobials this admission: Meropenem 12/17 Vancomycin 12/14 >> 12/20 Cefepime 12/7 >> 12/14; 12/17>12.20 Ceftriaxone 12/5>> 12/6 Azithromycin 12/5>12/6  Microbiology results: 12/4 BCx: neg 12/5 MRSA PCR: neg 12/12 Sputum: rare MRSA   12/17 BAL: MRSA   LiBonnita Nasutiharm.D. CPP, BCPS Clinical Pharmacist [phone]/26/2021 8:56 AM    Please check [email or web address] for unit-specific pharmacy phone numbers.

## 2020-09-24 NOTE — Progress Notes (Signed)
Neurosurgery  Asked to review CT scan for [facility], 59 yo M with ARDS on ECMO.  He had anemia which prompted imaging including a CT which showed a tiny amount of SAH in lateral sylvian fissure.  This is likely a consequence of his bivalirudin and ECMO.  No neurosurgical intervention indicated and this appears non-threatening.  However, would recommend a repeat CT head in the morning to check evolution.  Dr. Clarise Cruz will also stop his bivalirudin for a few hours.

## 2020-09-24 NOTE — Progress Notes (Signed)
RT note-Patient transported to CT and now back in 2H23

## 2020-09-24 NOTE — Progress Notes (Addendum)
ANTICOAGULATION CONSULT NOTE  Pharmacy Consult for bivalirudin Indication: ECMO + bilateral DVTs + SAH 12/26   Recent Labs    09/22/20 0335 09/22/20 0345 09/23/20 0415 09/23/20 0421 09/23/20 1541 09/23/20 1837 09/23/20 2113 09/23/20 2115 09/24/20 0349 09/24/20 0356 09/24/20 1309 09/24/20 1349 09/24/20 1431 09/24/20 1539 09/24/20 1930  HGB 8.3*   < > 8.4*   < > 8.5*   < > 7.4*   < > 8.4*   < > 7.5*  --  7.8* 8.6*  --   HCT 27.4*   < > 27.2*   < > 26.6*   < > 22.9*   < > 27.6*   < > 22.0*  --  23.0* 28.3*  --   PLT 154   < > 166   < > 151  --  140*  --  151  --   --   --   --  145*  --   APTT 56*   < > 57*  --  56*  --   --   --  61*  --   --  52*  --  41* 46*  LABPROT 19.1*  --  18.2*  --   --   --   --   --  18.6*  --   --   --   --   --   --   INR 1.7*  --  1.6*  --   --   --   --   --  1.6*  --   --   --   --   --   --   CREATININE 1.15   < > 1.00  --  1.00  --   --   --  1.07  --   --   --   --  1.15  --    < > = values in this interval not displayed.    Estimated Creatinine Clearance: 83.9 mL/min (by C-G formula based on SCr of 1.15 mg/dL).   Assessment: 59 yom unvaccinated presenting with severe COVID ARDs, intubated on 12/6, continued to have ventilation issues despite NMB and proning - started on VV ECMO on 12/8.  D-dimer >20 > now improved 14, found to have bilateral DVTs.   Has been on bivalirudin drip 0.1mg /kg/hr with aptts at low end of goal 50-60.   S/p Head CT 12/26 with new small SAH.  Held bivalirudin x3hr and resume at lower rate with lower goal 40-50sec until follow up CT to ensure not expanding. Aptt back at 41 after holding x3 hr, bival just restarted at lower rate.   Goal of Therapy:  aPTT 40-50 seconds (new 12/26)  Monitor platelets by anticoagulation protocol: Yes   Plan:  Continue IV bivalirudin to 0.05 mg/kg/hr  Check 4hr aPTT then q12h aPTT Follow bleeding, CBC  ADDENDUM: 1930 APTT 46 at goal. Continue 0.05 mg/kg/hr    Alphia Moh,  PharmD, BCPS, [facility] Clinical Pharmacist  Please check AMION for all [facility] phone numbers After 10:00 PM, call Main Pharmacy [phone]

## 2020-09-24 NOTE — Progress Notes (Addendum)
   Palliative Medicine Inpatient Follow Up Note  Reason for consult:  "ECMO"  HPI:  Per intake H&P --> Donald Rowe a 59 y.o.malewith a pertinent history ofhypothyroidism on levothyroxine and hyperlipidemia who was diagnosed with Covid 1 week ago and is not vaccinated. With EMS he was seen to be desaturating to 78% on room air. He states he started having symptoms on Friday and tested positive on Saturday.  Had initially elected to be DNAR without intubation though later changed this and got intubated on 12/6  in the setting of worsening respiratory failure with hypoxia. CTA chest w/ diffuse bilateral infiltrates. On 12/8 was cannulated for VV ECMO. Had been extubated on 12/9 then emergently reintubated in early evening due to clinical instability.   Palliative care was asked to get involved in the setting of VV ECMO to set goals and expectations with Donald Rowe's family.  Today's Discussion (09/24/2020): Chart reviewed.  Attended daily ECMO rounds. Has been placed back on fentanyl. S/P plasmapheresis x4 with LDH in 500's.  ECMO sweep increase w/ worsening leukocytosis restarted on vancomycin. (+) 2 e/o acute hypotension with Hgb decrease. Pan CT this morning CT head with a small SAH - neurosurgery has weighed to share it requires conservative management Bival held x2 hours. Remains to have labile pressures.  Patients wife, Donald Rowe met at bedside. She had spoken with Dr. Haroldine Laws and was updated on [name]'s current clinical state. We discussed the best case worse case and the "what if's" of Donald Rowe's current clinical situation. I shared with Donald Rowe that there is quite a bit that remains to be unknown about if a recovery will occur. She expresses understanding.  We discussed Donald Rowe burdens and stressors in the worst case scenario which are many. We agreed that hoping for the best and preparing for the worst in situations such as Donald Rowe's are always advised. Offered emotional support through  therapeutic listening.  SUMMARY OF RECOMMENDATIONS Full Code/Full Scope of Care  ECMO as a bridge to recovery  Spiritual support appreciated  Appreciate Dr. Haroldine Laws updating patients wife, Donald Rowe at bedside this afternoon  Appreciate Dr. Tacy Learn calling to update patients daughter, Donald Rowe this afternoon  Ongoing PMT support    Time Spent: 35 Greater than 50% of the time was spent in counseling and coordination of care ______________________________________________________________________________________ [facility] Team Team Cell Phone: [phone] Please utilize secure chat with additional questions, if there is no response within 30 minutes please call the above phone number  Palliative Medicine Team providers are available by phone from 7am to 7pm daily and can be reached through the team cell phone.  Should this patient require assistance outside of these hours, please call the patient's attending physician.

## 2020-09-25 ENCOUNTER — Inpatient Hospital Stay (HOSPITAL_COMMUNITY): Payer: HRSA Program

## 2020-09-25 DIAGNOSIS — J80 Acute respiratory distress syndrome: Secondary | ICD-10-CM | POA: Diagnosis not present

## 2020-09-25 DIAGNOSIS — I609 Nontraumatic subarachnoid hemorrhage, unspecified: Secondary | ICD-10-CM

## 2020-09-25 DIAGNOSIS — J9601 Acute respiratory failure with hypoxia: Secondary | ICD-10-CM | POA: Diagnosis not present

## 2020-09-25 DIAGNOSIS — E870 Hyperosmolality and hypernatremia: Secondary | ICD-10-CM | POA: Diagnosis not present

## 2020-09-25 DIAGNOSIS — U071 COVID-19: Secondary | ICD-10-CM | POA: Diagnosis not present

## 2020-09-25 LAB — POCT I-STAT 7, (LYTES, BLD GAS, ICA,H+H)
Acid-Base Excess: 3 mmol/L — ABNORMAL HIGH (ref 0.0–2.0)
Acid-Base Excess: 3 mmol/L — ABNORMAL HIGH (ref 0.0–2.0)
Acid-Base Excess: 1 mmol/L (ref 0.0–2.0)
Acid-Base Excess: 1 mmol/L (ref 0.0–2.0)
Acid-Base Excess: 2 mmol/L (ref 0.0–2.0)
Bicarbonate: 29.1 mmol/L — ABNORMAL HIGH (ref 20.0–28.0)
Bicarbonate: 29.5 mmol/L — ABNORMAL HIGH (ref 20.0–28.0)
Bicarbonate: 28 mmol/L (ref 20.0–28.0)
Bicarbonate: 28.3 mmol/L — ABNORMAL HIGH (ref 20.0–28.0)
Bicarbonate: 27.7 mmol/L (ref 20.0–28.0)
Calcium, Ion: 1.25 mmol/L (ref 1.15–1.40)
Calcium, Ion: 1.27 mmol/L (ref 1.15–1.40)
Calcium, Ion: 1.22 mmol/L (ref 1.15–1.40)
Calcium, Ion: 1.2 mmol/L (ref 1.15–1.40)
Calcium, Ion: 1.19 mmol/L (ref 1.15–1.40)
HCT: 23 % — ABNORMAL LOW (ref 39.0–52.0)
HCT: 24 % — ABNORMAL LOW (ref 39.0–52.0)
HCT: 23 % — ABNORMAL LOW (ref 39.0–52.0)
HCT: 25 % — ABNORMAL LOW (ref 39.0–52.0)
HCT: 25 % — ABNORMAL LOW (ref 39.0–52.0)
Hemoglobin: 7.8 g/dL — ABNORMAL LOW (ref 13.0–17.0)
Hemoglobin: 8.2 g/dL — ABNORMAL LOW (ref 13.0–17.0)
Hemoglobin: 7.8 g/dL — ABNORMAL LOW (ref 13.0–17.0)
Hemoglobin: 8.5 g/dL — ABNORMAL LOW (ref 13.0–17.0)
Hemoglobin: 8.5 g/dL — ABNORMAL LOW (ref 13.0–17.0)
O2 Saturation: 94 %
O2 Saturation: 92 %
O2 Saturation: 97 %
O2 Saturation: 96 %
O2 Saturation: 98 %
Patient temperature: 36.7
Patient temperature: 36.6
Patient temperature: 36.4
Potassium: 4 mmol/L (ref 3.5–5.1)
Potassium: 4 mmol/L (ref 3.5–5.1)
Potassium: 4.7 mmol/L (ref 3.5–5.1)
Potassium: 5.1 mmol/L (ref 3.5–5.1)
Potassium: 5.1 mmol/L (ref 3.5–5.1)
Sodium: 152 mmol/L — ABNORMAL HIGH (ref 135–145)
Sodium: 152 mmol/L — ABNORMAL HIGH (ref 135–145)
Sodium: 148 mmol/L — ABNORMAL HIGH (ref 135–145)
Sodium: 146 mmol/L — ABNORMAL HIGH (ref 135–145)
Sodium: 145 mmol/L (ref 135–145)
TCO2: 31 mmol/L (ref 22–32)
TCO2: 31 mmol/L (ref 22–32)
TCO2: 30 mmol/L (ref 22–32)
TCO2: 30 mmol/L (ref 22–32)
TCO2: 29 mmol/L (ref 22–32)
pCO2 arterial: 51.3 mmHg — ABNORMAL HIGH (ref 32.0–48.0)
pCO2 arterial: 57.5 mmHg — ABNORMAL HIGH (ref 32.0–48.0)
pCO2 arterial: 58.8 mmHg — ABNORMAL HIGH (ref 32.0–48.0)
pCO2 arterial: 56.3 mmHg — ABNORMAL HIGH (ref 32.0–48.0)
pCO2 arterial: 48.6 mmHg — ABNORMAL HIGH (ref 32.0–48.0)
pH, Arterial: 7.36 (ref 7.350–7.450)
pH, Arterial: 7.316 — ABNORMAL LOW (ref 7.350–7.450)
pH, Arterial: 7.286 — ABNORMAL LOW (ref 7.350–7.450)
pH, Arterial: 7.309 — ABNORMAL LOW (ref 7.350–7.450)
pH, Arterial: 7.361 (ref 7.350–7.450)
pO2, Arterial: 73 mmHg — ABNORMAL LOW (ref 83.0–108.0)
pO2, Arterial: 70 mmHg — ABNORMAL LOW (ref 83.0–108.0)
pO2, Arterial: 109 mmHg — ABNORMAL HIGH (ref 83.0–108.0)
pO2, Arterial: 89 mmHg (ref 83.0–108.0)
pO2, Arterial: 114 mmHg — ABNORMAL HIGH (ref 83.0–108.0)

## 2020-09-25 LAB — CBC
HCT: 26.7 % — ABNORMAL LOW (ref 39.0–52.0)
HCT: 26.2 % — ABNORMAL LOW (ref 39.0–52.0)
HCT: 25.4 % — ABNORMAL LOW (ref 39.0–52.0)
Hemoglobin: 8.3 g/dL — ABNORMAL LOW (ref 13.0–17.0)
Hemoglobin: 8.4 g/dL — ABNORMAL LOW (ref 13.0–17.0)
Hemoglobin: 8 g/dL — ABNORMAL LOW (ref 13.0–17.0)
MCH: 29.9 pg (ref 26.0–34.0)
MCH: 30.9 pg (ref 26.0–34.0)
MCH: 30.3 pg (ref 26.0–34.0)
MCHC: 31.1 g/dL (ref 30.0–36.0)
MCHC: 32.1 g/dL (ref 30.0–36.0)
MCHC: 31.5 g/dL (ref 30.0–36.0)
MCV: 96 fL (ref 80.0–100.0)
MCV: 96.3 fL (ref 80.0–100.0)
MCV: 96.2 fL (ref 80.0–100.0)
Platelets: 136 10*3/uL — ABNORMAL LOW (ref 150–400)
Platelets: 137 10*3/uL — ABNORMAL LOW (ref 150–400)
Platelets: 129 10*3/uL — ABNORMAL LOW (ref 150–400)
RBC: 2.78 MIL/uL — ABNORMAL LOW (ref 4.22–5.81)
RBC: 2.72 MIL/uL — ABNORMAL LOW (ref 4.22–5.81)
RBC: 2.64 MIL/uL — ABNORMAL LOW (ref 4.22–5.81)
RDW: 18.3 % — ABNORMAL HIGH (ref 11.5–15.5)
RDW: 18.8 % — ABNORMAL HIGH (ref 11.5–15.5)
RDW: 18.6 % — ABNORMAL HIGH (ref 11.5–15.5)
WBC: 9.5 10*3/uL (ref 4.0–10.5)
WBC: 8.8 10*3/uL (ref 4.0–10.5)
WBC: 7.6 10*3/uL (ref 4.0–10.5)
nRBC: 0 % (ref 0.0–0.2)
nRBC: 0 % (ref 0.0–0.2)
nRBC: 0.3 % — ABNORMAL HIGH (ref 0.0–0.2)

## 2020-09-25 LAB — RENAL FUNCTION PANEL
Albumin: 2.5 g/dL — ABNORMAL LOW (ref 3.5–5.0)
Anion gap: 7 (ref 5–15)
BUN: 96 mg/dL — ABNORMAL HIGH (ref 6–20)
CO2: 26 mmol/L (ref 22–32)
Calcium: 7.8 mg/dL — ABNORMAL LOW (ref 8.9–10.3)
Chloride: 113 mmol/L — ABNORMAL HIGH (ref 98–111)
Creatinine, Ser: 1.13 mg/dL (ref 0.61–1.24)
GFR, Estimated: >60 mL/min (ref >60)
Glucose, Bld: 134 mg/dL — ABNORMAL HIGH (ref 70–99)
Phosphorus: 4.6 mg/dL (ref 2.5–4.6)
Potassium: 4.7 mmol/L (ref 3.5–5.1)
Sodium: 146 mmol/L — ABNORMAL HIGH (ref 135–145)

## 2020-09-25 LAB — HEPATIC FUNCTION PANEL
ALT: 59 U/L — ABNORMAL HIGH (ref 0–44)
AST: 44 U/L — ABNORMAL HIGH (ref 15–41)
Albumin: 2.5 g/dL — ABNORMAL LOW (ref 3.5–5.0)
Alkaline Phosphatase: 113 U/L (ref 38–126)
Bilirubin, Direct: 0.7 mg/dL — ABNORMAL HIGH (ref 0.0–0.2)
Indirect Bilirubin: 1 mg/dL — ABNORMAL HIGH (ref 0.3–0.9)
Total Bilirubin: 1.7 mg/dL — ABNORMAL HIGH (ref 0.3–1.2)
Total Protein: 4.5 g/dL — ABNORMAL LOW (ref 6.5–8.1)

## 2020-09-25 LAB — BASIC METABOLIC PANEL
Anion gap: 9 (ref 5–15)
BUN: 108 mg/dL — ABNORMAL HIGH (ref 6–20)
CO2: 27 mmol/L (ref 22–32)
Calcium: 8.1 mg/dL — ABNORMAL LOW (ref 8.9–10.3)
Chloride: 114 mmol/L — ABNORMAL HIGH (ref 98–111)
Creatinine, Ser: 1.35 mg/dL — ABNORMAL HIGH (ref 0.61–1.24)
GFR, Estimated: >60 mL/min (ref >60)
Glucose, Bld: 93 mg/dL (ref 70–99)
Potassium: 4 mmol/L (ref 3.5–5.1)
Sodium: 150 mmol/L — ABNORMAL HIGH (ref 135–145)

## 2020-09-25 LAB — PROTIME-INR
INR: 1.6 — ABNORMAL HIGH (ref 0.8–1.2)
Prothrombin Time: 18.1 seconds — ABNORMAL HIGH (ref 11.4–15.2)

## 2020-09-25 LAB — LACTIC ACID, PLASMA
Lactic Acid, Venous: 0.8 mmol/L (ref 0.5–1.9)
Lactic Acid, Venous: 0.8 mmol/L (ref 0.5–1.9)

## 2020-09-25 LAB — APTT
aPTT: 50 seconds — ABNORMAL HIGH (ref 24–36)
aPTT: 50 seconds — ABNORMAL HIGH (ref 24–36)

## 2020-09-25 LAB — GLUCOSE, CAPILLARY
Glucose-Capillary: 116 mg/dL — ABNORMAL HIGH (ref 70–99)
Glucose-Capillary: 85 mg/dL (ref 70–99)
Glucose-Capillary: 93 mg/dL (ref 70–99)
Glucose-Capillary: 160 mg/dL — ABNORMAL HIGH (ref 70–99)
Glucose-Capillary: 128 mg/dL — ABNORMAL HIGH (ref 70–99)
Glucose-Capillary: 126 mg/dL — ABNORMAL HIGH (ref 70–99)
Glucose-Capillary: 115 mg/dL — ABNORMAL HIGH (ref 70–99)

## 2020-09-25 LAB — PREPARE RBC (CROSSMATCH)

## 2020-09-25 LAB — FIBRINOGEN: Fibrinogen: 358 mg/dL (ref 210–475)

## 2020-09-25 LAB — LACTATE DEHYDROGENASE: LDH: 488 U/L — ABNORMAL HIGH (ref 98–192)

## 2020-09-25 LAB — TRIGLYCERIDES: Triglycerides: 71 mg/dL (ref <150)

## 2020-09-25 MED ORDER — PRISMASOL BGK 4/2.5 32-4-2.5 MEQ/L EC SOLN: Status: DC

## 2020-09-25 MED ORDER — INSULIN DETEMIR 100 UNIT/ML ~~LOC~~ SOLN
25.0000 [IU] | Freq: Two times a day (BID) | SUBCUTANEOUS | Status: DC
  Filled 2020-09-25 (×2): qty 0.25

## 2020-09-25 MED ORDER — SODIUM BICARBONATE 8.4 % IV SOLN
INTRAVENOUS | Status: AC
  Filled 2020-09-25: qty 50

## 2020-09-25 MED ORDER — INSULIN DETEMIR 100 UNIT/ML ~~LOC~~ SOLN
13.0000 [IU] | Freq: Two times a day (BID) | SUBCUTANEOUS | Status: DC
  Administered 2020-09-25 – 2020-09-29 (×10): 13 [IU] via SUBCUTANEOUS
  Filled 2020-09-25 (×16): qty 0.13

## 2020-09-25 MED ORDER — DEXTROSE 10 % IV SOLN: INTRAVENOUS | Status: DC

## 2020-09-25 MED ORDER — PRISMASOL BGK 4/2.5 32-4-2.5 MEQ/L REPLACEMENT SOLN: Status: DC

## 2020-09-25 MED ORDER — INSULIN ASPART 100 UNIT/ML ~~LOC~~ SOLN
2.0000 [IU] | SUBCUTANEOUS | Status: DC
  Administered 2020-09-25 (×2): 2 [IU] via SUBCUTANEOUS
  Administered 2020-09-25 – 2020-09-26 (×2): 4 [IU] via SUBCUTANEOUS
  Administered 2020-09-27: 12:00:00 2 [IU] via SUBCUTANEOUS
  Administered 2020-09-27: 15:00:00 4 [IU] via SUBCUTANEOUS
  Administered 2020-09-28 – 2020-09-29 (×3): 2 [IU] via SUBCUTANEOUS
  Administered 2020-09-29: 4 [IU] via SUBCUTANEOUS
  Administered 2020-09-29 – 2020-10-05 (×12): 2 [IU] via SUBCUTANEOUS
  Administered 2020-10-05: 4 [IU] via SUBCUTANEOUS
  Administered 2020-10-06 (×5): 2 [IU] via SUBCUTANEOUS
  Administered 2020-10-07: 4 [IU] via SUBCUTANEOUS
  Administered 2020-10-07 (×2): 2 [IU] via SUBCUTANEOUS
  Administered 2020-10-07: 4 [IU] via SUBCUTANEOUS
  Administered 2020-10-07 (×2): 2 [IU] via SUBCUTANEOUS
  Administered 2020-10-08 (×3): 4 [IU] via SUBCUTANEOUS
  Administered 2020-10-08 – 2020-10-09 (×4): 2 [IU] via SUBCUTANEOUS
  Administered 2020-10-09: 4 [IU] via SUBCUTANEOUS
  Administered 2020-10-09 – 2020-10-10 (×5): 2 [IU] via SUBCUTANEOUS
  Administered 2020-10-11 (×2): 4 [IU] via SUBCUTANEOUS
  Administered 2020-10-11 (×2): 2 [IU] via SUBCUTANEOUS
  Administered 2020-10-11 – 2020-10-12 (×3): 4 [IU] via SUBCUTANEOUS
  Administered 2020-10-12: 2 [IU] via SUBCUTANEOUS

## 2020-09-25 MED ORDER — SODIUM CHLORIDE 0.9% IV SOLUTION: Freq: Once | INTRAVENOUS | Status: AC

## 2020-09-25 MED ORDER — NOREPINEPHRINE 16 MG/250ML-% IV SOLN
0.0000 ug/min | INTRAVENOUS | Status: DC
  Administered 2020-09-26: 22:00:00 10 ug/min via INTRAVENOUS

## 2020-09-25 MED ORDER — EPINEPHRINE 1 MG/10ML IJ SOSY
PREFILLED_SYRINGE | INTRAMUSCULAR | Status: AC
  Filled 2020-09-25: qty 20

## 2020-09-25 MED ORDER — INSULIN ASPART 100 UNIT/ML ~~LOC~~ SOLN
3.0000 [IU] | SUBCUTANEOUS | Status: DC
  Administered 2020-09-25 – 2020-09-29 (×28): 3 [IU] via SUBCUTANEOUS

## 2020-09-25 MED ORDER — ALBUMIN HUMAN 5 % IV SOLN
INTRAVENOUS | Status: AC
  Filled 2020-09-25: qty 250

## 2020-09-25 MED ORDER — INSULIN ASPART 100 UNIT/ML ~~LOC~~ SOLN
5.0000 [IU] | SUBCUTANEOUS | Status: DC
  Administered 2020-09-25: 04:00:00 5 [IU] via SUBCUTANEOUS

## 2020-09-25 MED ORDER — PHENYLEPHRINE 40 MCG/ML (10ML) SYRINGE FOR IV PUSH (FOR BLOOD PRESSURE SUPPORT)
PREFILLED_SYRINGE | INTRAVENOUS | Status: AC
  Filled 2020-09-25: qty 10

## 2020-09-25 MED ORDER — METOPROLOL TARTRATE 25 MG/10 ML ORAL SUSPENSION
25.0000 mg | Freq: Two times a day (BID) | ORAL | Status: DC
  Administered 2020-09-25 – 2020-09-26 (×3): 25 mg
  Filled 2020-09-25 (×3): qty 10

## 2020-09-25 MED ORDER — CALCIUM CHLORIDE 10 % IV SOLN
INTRAVENOUS | Status: AC
  Filled 2020-09-25: qty 10

## 2020-09-25 MED ORDER — METHYLPREDNISOLONE SODIUM SUCC 40 MG IJ SOLR
20.0000 mg | INTRAMUSCULAR | Status: AC
  Administered 2020-09-25 – 2020-09-28 (×4): 20 mg via INTRAVENOUS
  Filled 2020-09-25 (×4): qty 1

## 2020-09-25 NOTE — Progress Notes (Signed)
This chaplain responded to PMT consult for spiritual care.  The chaplain phoned the Pt. wife using the number in Epic.  Donald Rowe shares her willingness to talk to the chaplain today and begin a spiritual care relationship. Donald Rowe updated the chaplain she is waiting on the Pt. test results from the healthcare team.  The chaplain listened as Donald Rowe introduced the chaplain to the Pt. family and their faith. Donald Rowe agreed with the chaplain's reflection of the family relationship as, "leaning on each other." The chaplain understands the family's spirituality is a reflection of the Pt. example.   The chaplain accepted Donald Rowe's request for continuous Pt. prayer

## 2020-09-25 NOTE — Progress Notes (Signed)
[facility] NEPHROLOGY PROGRESS NOTE  Assessment/ Plan: Pt is a 59 y.o. 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  # AKI, nonoliguric: due to ischemic ATN associted with covid-19 infection, IV contrast. Nonoliguric however complicated by hypernatremia and anasarca.  He was treated with free water for hypernatremia causing fluid overload.  Mild elevated creatinine level today probably due to contrast received on 12/26.  Azotemia due to steroid, tube feed, hemolysis and the use of diuretics.  I have called patient's wife who agreed with starting CRRT. Right femoral HD catheter placed 09/13/20 by PCCM.  Discussed about possible need for catheter exchange however, Dr. Lynetta Mare wants to use current femoral catheter especially since it is clean and no sign of infection. CRRT all 4K bath, UF 100-150 cc/hour as tolerated by BP.  On Angiomax for anticoagulation.  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  LDH stable.   #Acute hypoxic respiratory failure/ARDS- secondary to covid-19 infection on VV ECMO. S/p trach 09/14/20.  # Hypernatremia: Treated with free water with fluid overload now.  Managing with CRRT.  Discontinue Lasix.   # Bilateral lower extremity DVT's- on bivalirudin.  # HCAP/MRSA pneumonitis/tracheitis- on IV vancomycin.  # HTN/volume: On nicardipine.  CRRT to manage volume.   Subjective: Seen and examined in ICU.  Patient is on vent, sedated.  Unable to obtain review of system.  Discussed with the bedside nurse. Objective Vital signs in last 24 hours: Vitals:   09/25/20 0600 09/25/20 0730 09/25/20 0800 09/25/20 0835  BP:  (!) 158/47 (!) 136/59 (!) 117/51 (!) 127/42  Pulse: (!) 110 (!) 115 (!) 127 (!) 118  Resp: $Remo'12 18 16 16  'fQjqp$ Temp:   97.9 F (36.6 C)   TempSrc:   Core   SpO2: 96% 96% 96% 94%  Weight: 119 kg     Height:       Weight change: 10.5 kg  Intake/Output Summary (Last 24 hours) at 09/25/2020 0902 Last data filed at 09/25/2020 0800 Gross per 24 hour  Intake 7392.68 ml  Output 2685 ml  Net 4707.68 ml       Labs: Basic Metabolic Panel: Recent Labs  Lab 09/24/20 0349 09/24/20 0356 09/24/20 1539 09/24/20 1934 09/25/20 0400 09/25/20 0404  NA 151*   < > 149* 150* 150* 152*  K 5.4*   < > 5.2* 5.3* 4.0 4.0  CL 113*  --  112*  --  114*  --   CO2 31  --  28  --  27  --   GLUCOSE 124*  --  185*  --  93  --   BUN 92*  --  100*  --  108*  --   CREATININE 1.07  --  1.15  --  1.35*  --   CALCIUM 8.3*  --  8.1*  --  8.1*  --    < > = values in this interval not displayed.   Liver Function Tests: Recent Labs  Lab 09/23/20 0415 09/24/20 0349 09/25/20 0400  AST 67* 50* 44*  ALT 86* 71* 59*  ALKPHOS 140* 137* 113  BILITOT 1.7* 1.9* 1.7*  PROT 4.9* 4.8* 4.5*  ALBUMIN 2.8* 2.8* 2.5*   No results for input(s): LIPASE, AMYLASE in the last  168 hours. Recent Labs  Lab 09/24/20 1128  AMMONIA 36*   CBC: Recent Labs  Lab 09/23/20 2113 09/23/20 2115 09/24/20 0349 09/24/20 0356 09/24/20 1539 09/24/20 1934 09/24/20 2245 09/25/20 0400 09/25/20 0404  WBC 8.3  --  11.4*  --  9.4  --  7.4 9.5  --   HGB 7.4*   < > 8.4*   < > 8.6*   < > 7.4* 8.3* 7.8*  HCT 22.9*   < > 27.6*   < > 28.3*   < > 24.3* 26.7* 23.0*  MCV 96.6  --  98.2  --  97.6  --  98.8 96.0  --   PLT 140*  --  151  --  145*  --  125* 136*  --    < > = values in this interval not displayed.   Cardiac Enzymes: No results for input(s): CKTOTAL, CKMB, CKMBINDEX, TROPONINI in the last 168 hours. CBG: Recent Labs  Lab 09/24/20 1137 09/24/20 1553 09/24/20 1932 09/24/20 2245 09/25/20 0400  GLUCAP 114* 170* 136* 116* 85     Iron Studies: No results for input(s): IRON, TIBC, TRANSFERRIN, FERRITIN in the last 72 hours. Studies/Results: CT HEAD WO CONTRAST  Result Date: 09/24/2020 CLINICAL DATA:  Altered mental status, currently on ECMO EXAM: CT HEAD WITHOUT CONTRAST TECHNIQUE: Contiguous axial images were obtained from the base of the skull through the vertex without intravenous contrast. COMPARISON:  09/07/2020 FINDINGS: Brain: Minimal subarachnoid hemorrhage is noted in the left sylvian fissure. This is new from the prior exam. No other areas of subarachnoid hemorrhage are noted. Vascular: No hyperdense vessel or unexpected calcification. Skull: Normal. Negative for fracture or focal lesion. Sinuses/Orbits: No acute finding. Other: Fluid is noted within the mastoid air cells bilaterally new from the prior exam. IMPRESSION: New subarachnoid hemorrhage on the left in the sylvian fissure. Mastoid air cell effusions bilaterally. Critical Value/emergent results were called by telephone at the time of interpretation on 09/24/2020 at 10:56 am to Warden Fillers, ECMO specialist who will relay findings to Dr. Glori Bickers. Electronically Signed   By: Inez Catalina M.D.   On: 09/24/2020 10:57   CT CHEST W CONTRAST  Result Date: 09/24/2020 CLINICAL DATA:  ECMO patient, acute drop in hemoglobin, hypotension, retroperitoneal hematoma suspected EXAM: CT CHEST, ABDOMEN, AND PELVIS WITH CONTRAST TECHNIQUE: Multidetector CT imaging of the chest, abdomen and pelvis was performed following the standard protocol during bolus administration of intravenous contrast. CONTRAST:  126mL OMNIPAQUE IOHEXOL 300 MG/ML  SOLN COMPARISON:  None. FINDINGS: CT CHEST FINDINGS Cardiovascular: Right internal jugular approach veno venous ECMO cannula, tip positioned in the superior abdominal inferior vena cava. Left upper extremity PICC, tip positioned within the mid SVC. Normal heart size. Left coronary artery calcifications. No pericardial effusion.  Mediastinum/Nodes: No enlarged mediastinal, hilar, or axillary lymph nodes. Extensive pneumomediastinum. Tracheostomy. Thyroid and esophagus demonstrate no significant findings. Lungs/Pleura: Examination of the lungs is significantly limited by breath motion artifact. There is very extensive, dense heterogeneous airspace opacity and consolidation throughout the lungs with small bilateral pleural effusions. No significant pneumothorax. Musculoskeletal: No chest wall mass or suspicious bone lesions identified. There is subpectoral emphysema about the chest wall (series 3, image 17). CT ABDOMEN PELVIS FINDINGS Hepatobiliary: No solid liver abnormality is seen. No gallstones, gallbladder wall thickening, or biliary dilatation. Pancreas: Unremarkable. No pancreatic ductal dilatation or surrounding inflammatory changes. Spleen: Normal in size without significant abnormality. Adrenals/Urinary Tract: Adrenal glands are unremarkable. Kidneys are normal, without renal calculi, solid lesion, or  hydronephrosis. Foley catheter within the urinary bladder. Stomach/Bowel: Stomach is within normal limits. Enteric feeding tube is position with tip in the proximal jejunum. Appendix appears normal. No evidence of bowel wall thickening, distention, or inflammatory changes. Descending and sigmoid diverticulosis. Rectal tube. Vascular/Lymphatic: Aortic atherosclerosis. Right femoral venous catheter, tip position in the external iliac vein (series 3, image 107). No enlarged abdominal or pelvic lymph nodes. Reproductive: No mass or other abnormality. Other: No abdominal wall hernia or abnormality. Anasarca. Trace ascites. Musculoskeletal: No acute or significant osseous findings. IMPRESSION: 1. No evidence of retroperitoneal hematoma or other source of hemorrhage in the chest, abdomen, or pelvis. 2. Extensive pneumomediastinum and subpectoral emphysema about the chest wall, likely due to barotrauma. 3. Examination of the lungs is  significantly limited by breath motion artifact. There is very extensive, dense heterogeneous airspace opacity and consolidation throughout the lungs with small bilateral pleural effusions. Findings are most consistent with ARDS. 4. Support apparatus including tracheostomy and venovenous ECMO as detailed above. 5. Trace ascites and anasarca. 6. Coronary artery disease. Aortic Atherosclerosis (ICD10-I70.0). Electronically Signed   By: Eddie Candle M.D.   On: 09/24/2020 10:53   CT ABDOMEN PELVIS W CONTRAST  Result Date: 09/24/2020 CLINICAL DATA:  ECMO patient, acute drop in hemoglobin, hypotension, retroperitoneal hematoma suspected EXAM: CT CHEST, ABDOMEN, AND PELVIS WITH CONTRAST TECHNIQUE: Multidetector CT imaging of the chest, abdomen and pelvis was performed following the standard protocol during bolus administration of intravenous contrast. CONTRAST:  127mL OMNIPAQUE IOHEXOL 300 MG/ML  SOLN COMPARISON:  None. FINDINGS: CT CHEST FINDINGS Cardiovascular: Right internal jugular approach veno venous ECMO cannula, tip positioned in the superior abdominal inferior vena cava. Left upper extremity PICC, tip positioned within the mid SVC. Normal heart size. Left coronary artery calcifications. No pericardial effusion. Mediastinum/Nodes: No enlarged mediastinal, hilar, or axillary lymph nodes. Extensive pneumomediastinum. Tracheostomy. Thyroid and esophagus demonstrate no significant findings. Lungs/Pleura: Examination of the lungs is significantly limited by breath motion artifact. There is very extensive, dense heterogeneous airspace opacity and consolidation throughout the lungs with small bilateral pleural effusions. No significant pneumothorax. Musculoskeletal: No chest wall mass or suspicious bone lesions identified. There is subpectoral emphysema about the chest wall (series 3, image 17). CT ABDOMEN PELVIS FINDINGS Hepatobiliary: No solid liver abnormality is seen. No gallstones, gallbladder wall thickening,  or biliary dilatation. Pancreas: Unremarkable. No pancreatic ductal dilatation or surrounding inflammatory changes. Spleen: Normal in size without significant abnormality. Adrenals/Urinary Tract: Adrenal glands are unremarkable. Kidneys are normal, without renal calculi, solid lesion, or hydronephrosis. Foley catheter within the urinary bladder. Stomach/Bowel: Stomach is within normal limits. Enteric feeding tube is position with tip in the proximal jejunum. Appendix appears normal. No evidence of bowel wall thickening, distention, or inflammatory changes. Descending and sigmoid diverticulosis. Rectal tube. Vascular/Lymphatic: Aortic atherosclerosis. Right femoral venous catheter, tip position in the external iliac vein (series 3, image 107). No enlarged abdominal or pelvic lymph nodes. Reproductive: No mass or other abnormality. Other: No abdominal wall hernia or abnormality. Anasarca. Trace ascites. Musculoskeletal: No acute or significant osseous findings. IMPRESSION: 1. No evidence of retroperitoneal hematoma or other source of hemorrhage in the chest, abdomen, or pelvis. 2. Extensive pneumomediastinum and subpectoral emphysema about the chest wall, likely due to barotrauma. 3. Examination of the lungs is significantly limited by breath motion artifact. There is very extensive, dense heterogeneous airspace opacity and consolidation throughout the lungs with small bilateral pleural effusions. Findings are most consistent with ARDS. 4. Support apparatus including tracheostomy and venovenous ECMO as detailed  above. 5. Trace ascites and anasarca. 6. Coronary artery disease. Aortic Atherosclerosis (ICD10-I70.0). Electronically Signed   By: Eddie Candle M.D.   On: 09/24/2020 10:53   DG CHEST PORT 1 VIEW  Result Date: 09/25/2020 CLINICAL DATA:  Respiratory failure. COVID pneumonia. ECMO. EXAM: PORTABLE CHEST 1 VIEW COMPARISON:  09/24/2020 FINDINGS: The tracheostomy tube is stable. The feeding tube is stable. Left  PICC line is stable. ECMO catheter is unchanged. Persistent diffuse airspace process in the lungs. No large pleural effusion or pneumothorax. IMPRESSION: 1. Stable support apparatus. 2. Persistent diffuse airspace process. Electronically Signed   By: Marijo Sanes M.D.   On: 09/25/2020 07:43   DG CHEST PORT 1 VIEW  Result Date: 09/24/2020 CLINICAL DATA:  Check tracheostomy placement EXAM: PORTABLE CHEST 1 VIEW COMPARISON:  09/23/2020 FINDINGS: Cardiac shadow is stable. Tracheostomy tube, left-sided PICC line and feeding catheter are again noted and stable. ECMO cannula is noted extending into the IVC. Diffuse bilateral airspace disease is again noted and stable. Changes of pneumomediastinum are noted but decreased. Small right pleural effusion is noted. No bony abnormality is seen. IMPRESSION: Relatively stable appearance of the chest although some decrease in the degree of pneumomediastinum is noted. Electronically Signed   By: Inez Catalina M.D.   On: 09/24/2020 08:00    Medications: Infusions: . sodium chloride 10 mL/hr at 09/20/20 1900  . albumin human    . albumin human 12.5 g (09/23/20 0857)  . albumin human    . bivalirudin (ANGIOMAX) infusion 0.5 mg/mL (Non-ACS indications) 0.05 mg/kg/hr (09/25/20 0800)  . ceFEPime (MAXIPIME) IV Stopped (09/25/20 0546)  . dextrose    . feeding supplement (PIVOT 1.5 CAL) 1,000 mL (09/25/20 0400)  . fentaNYL infusion INTRAVENOUS 325 mcg/hr (09/25/20 0800)  . niCARDipine 4 mg/hr (09/25/20 0800)  . propofol (DIPRIVAN) infusion 40 mcg/kg/min (09/25/20 0800)  . vancomycin Stopped (09/24/20 2302)    Scheduled Medications: . amLODipine  10 mg Per Tube QHS  . calcium chloride      . chlorhexidine gluconate (MEDLINE KIT)  15 mL Mouth Rinse BID  . Chlorhexidine Gluconate Cloth  6 each Topical Daily  . clonazePAM  2 mg Per Tube BID  . cloNIDine  0.2 mg Per Tube TID  . docusate  100 mg Per Tube BID  . EPINEPHrine      . feeding supplement (PROSource TF)   45 mL Per Tube TID  . fiber  1 packet Per Tube BID  . free water  100 mL Per Tube Q1H  . furosemide  40 mg Intravenous Daily  . insulin aspart  2-6 Units Subcutaneous Q4H  . insulin aspart  3 Units Subcutaneous Q4H  . insulin detemir  13 Units Subcutaneous BID  . levothyroxine  50 mcg Per Tube Q0600  . mouth rinse  15 mL Mouth Rinse 10 times per day  . methylPREDNISolone (SOLU-MEDROL) injection  20 mg Intravenous Q24H  . metoprolol tartrate  25 mg Per Tube BID  . nystatin  5 mL Oral QID  . oxyCODONE  10 mg Per Tube Q6H  . pantoprazole sodium  40 mg Per Tube BID  . phenylephrine      . pravastatin  40 mg Per Tube q1800  . QUEtiapine  100 mg Per Tube BID  . sodium bicarbonate      . sodium chloride flush  10-40 mL Intracatheter Q12H    have reviewed scheduled and prn medications.  Physical Exam: General: Critically ill looking male lying on bed, has tracheostomy  on vent.  Sedated. Heart:RRR, s1s2 nl Lungs: Coarse breath sound bilateral Abdomen:soft, Non-tender, non-distended Extremities: Pitting edema ++, anasarca+ Dialysis Access: Right femoral HD catheter site clean.  Donald Rowe Donald Rowe Donald Rowe 09/25/2020,9:02 AM  LOS: 23 days

## 2020-09-25 NOTE — Progress Notes (Signed)
Patient transported to CT#2 with bedside RN, RT, transporter, ECMO Specialist and perfusionist [facility].  Machine on battery power (5hours) and removed from wall O2 to tank at 8LPM.  Patient tolerated trip without incident.  Returned to 2H23, placed on wall power (red outlet) and wall O2 sweep verified at 8lpm.    Aline August, ECMO Specialist

## 2020-09-25 NOTE — Progress Notes (Signed)
ANTICOAGULATION CONSULT NOTE  Pharmacy Consult for bivalirudin Indication: ECMO + bilateral DVTs + [facility] 12/26   Recent Labs    09/23/20 0415 09/23/20 0421 09/24/20 0349 09/24/20 0356 09/24/20 1539 09/24/20 1930 09/24/20 1934 09/24/20 2245 09/25/20 0400 09/25/20 0404  HGB 8.4*   < > 8.4*   < > 8.6*  --    < > 7.4* 8.3* 7.8*  HCT 27.2*   < > 27.6*   < > 28.3*  --    < > 24.3* 26.7* 23.0*  PLT 166   < > 151  --  145*  --   --  125* 136*  --   APTT 57*   < > 61*   < > 41* 46*  --   --  50*  --   LABPROT 18.2*  --  18.6*  --   --   --   --   --  18.1*  --   INR 1.6*  --  1.6*  --   --   --   --   --  1.6*  --   CREATININE 1.00   < > 1.07  --  1.15  --   --   --  1.35*  --    < > = values in this interval not displayed.    Estimated Creatinine Clearance: 75 mL/min (A) (by C-G formula based on SCr of 1.35 mg/dL (H)).   Assessment: 68 yom unvaccinated presenting with severe COVID ARDs, intubated on 12/6, continued to have ventilation issues despite NMB and proning - started on VV ECMO on 12/8.  D-dimer >20 > now improved 14, found to have bilateral DVTs.   Has been on bivalirudin drip 0.1mg /kg/hr with aptts at low end of goal 50-60.   S/p Head CT 12/26 with new small SAH.  Held bivalirudin x3hr and resume at lower rate with lower goal 40-50sec until follow up CT to ensure not expanding. Overnight some rectal bleeding > rectal tube removed, 1unit blood CBC stable this am, fibrinogen stable 300-400s, LDH stable 400-500s Aptt 50 seconds at goal  this am on bivalirudin 0.05mg /kg/hr .   Goal of Therapy:  aPTT 40-50 seconds (new 12/26)  Monitor platelets by anticoagulation protocol: Yes   Plan:  Continue IV bivalirudin to 0.05 mg/kg/hr  Check  q12h aPTT and CBC  Follow bleeding    Leota Sauers Pharm.D. CPP, BCPS Clinical Pharmacist [phone] 09/25/2020 7:21 AM    Please check AMION for all [facility] phone numbers After 10:00 PM, call Main Pharmacy [phone]

## 2020-09-25 NOTE — Progress Notes (Signed)
Patient ID: Donald Rowe, male   DOB: 10/31/1960, 59 y.o.   MRN: [id number]    Advanced Heart Failure Rounding Note   Subjective:    12/8 Cannulated for VV ECMO - 32 FR  [facility] 12/9 Extubated/Reintubated for altered mental status 12/13 Circuit change for elevated LDH 12/15 PLEX started 12/16 Tracheostomy 12/23 head CT (normal) for change in mental status.  12/26 Pan CT for drop in hgb. Small SAH. Severe lung disease  Had pan CT yesterday for hypotension with small SAH. Severe lung disease. Bival held for 3 hours. PTT goal decreased 40-50. Having some rectal bleeding. Rectal tube removed  Better night. Now sedated on propofol to help with BP lability. Remains on nicardipine as well.   Weight up several pounds/ I/Os positive.    Had PLEX 4/4 12/22 LDH 434 -> 544 -> 419 -> 551 -> 548 -> 488   ECMO   Speed 3800 Flow 4.8L Sweep 8 dP 24 pVen  92  ABG: 7.36/51/73/94% Hgb 8.3 LDH 434 -> 544 -> 419 -> 551 -> 579 -> 590 Lactic acid 0.8 PTT 50  Vent 40% TVs 320-350  Objective:   Weight Range:  Vital Signs:   Temp:  [96.1 F (35.6 C)-98.4 F (36.9 C)] 98.1 F (36.7 C) (12/27 0400) Pulse Rate:  [74-132] 110 (12/27 0600) Resp:  [0-99] 12 (12/27 0600) BP: (85-167)/(40-78) 158/47 (12/27 0600) SpO2:  [82 %-100 %] 96 % (12/27 0600) Arterial Line BP: (92-216)/(37-70) 191/57 (12/27 0600) FiO2 (%):  [40 %-100 %] 60 % (12/27 0438) Weight:  [891 kg] 119 kg (12/27 0600) Last BM Date: 09/23/20  Weight change: Filed Weights   09/23/20 0600 09/24/20 0600 09/25/20 0600  Weight: 111.9 kg 108.5 kg 119 kg    Intake/Output:   Intake/Output Summary (Last 24 hours) at 09/25/2020 0706 Last data filed at 09/25/2020 0700 Gross per 24 hour  Intake 7443.81 ml  Output 2560 ml  Net 4883.81 ml     Physical Exam: General:  Sedated on vent HEENT: normal Neck: supple. RIJ ECMO cannula + trach Carotids 2+ bilat; no bruits. No lymphadenopathy or thryomegaly appreciated. Cor: PMI  nondisplaced. Regular tachy  Lungs: coarse Abdomen: obese soft, nontender, nondistended. No hepatosplenomegaly. No bruits or masses. Good bowel sounds. Extremities: no cyanosis, clubbing, rash, 1+ edema Neuro: sedated on vent  Telemetry: sinus 110-130 Personally reviewed    Labs: Basic Metabolic Panel: Recent Labs  Lab 09/23/20 0415 09/23/20 0421 09/23/20 1541 09/23/20 1837 09/24/20 0349 09/24/20 0356 09/24/20 1431 09/24/20 1539 09/24/20 1934 09/25/20 0400 09/25/20 0404  NA 152*   < > 149*   < > 151*   < > 151* 149* 150* 150* 152*  K 4.9   < > 5.5*   < > 5.4*   < > 5.0 5.2* 5.3* 4.0 4.0  CL 115*  --  111  --  113*  --   --  112*  --  114*  --   CO2 31  --  31  --  31  --   --  28  --  27  --   GLUCOSE 130*  --  144*  --  124*  --   --  185*  --  93  --   BUN 81*  --  85*  --  92*  --   --  100*  --  108*  --   CREATININE 1.00  --  1.00  --  1.07  --   --  1.15  --  1.35*  --   CALCIUM 8.5*  --  8.2*  --  8.3*  --   --  8.1*  --  8.1*  --    < > = values in this interval not displayed.    Liver Function Tests: Recent Labs  Lab 09/21/20 0411 09/22/20 0335 09/23/20 0415 09/24/20 0349 09/25/20 0400  AST 51* 49* 67* 50* 44*  ALT 68* 62* 86* 71* 59*  ALKPHOS 81 100 140* 137* 113  BILITOT 1.7* 1.9* 1.7* 1.9* 1.7*  PROT 4.8* 4.7* 4.9* 4.8* 4.5*  ALBUMIN 3.2* 2.9* 2.8* 2.8* 2.5*   No results for input(s): LIPASE, AMYLASE in the last 168 hours. Recent Labs  Lab 09/24/20 1128  AMMONIA 36*    CBC: Recent Labs  Lab 09/23/20 2113 09/23/20 2115 09/24/20 0349 09/24/20 0356 09/24/20 1539 09/24/20 1934 09/24/20 2245 09/25/20 0400 09/25/20 0404  WBC 8.3  --  11.4*  --  9.4  --  7.4 9.5  --   HGB 7.4*   < > 8.4*   < > 8.6* 8.2* 7.4* 8.3* 7.8*  HCT 22.9*   < > 27.6*   < > 28.3* 24.0* 24.3* 26.7* 23.0*  MCV 96.6  --  98.2  --  97.6  --  98.8 96.0  --   PLT 140*  --  151  --  145*  --  125* 136*  --    < > = values in this interval not displayed.    Cardiac  Enzymes: No results for input(s): CKTOTAL, CKMB, CKMBINDEX, TROPONINI in the last 168 hours.  BNP: BNP (last 3 results) No results for input(s): BNP in the last 8760 hours.  ProBNP (last 3 results) No results for input(s): PROBNP in the last 8760 hours.    Other results:  Imaging: CT HEAD WO CONTRAST  Result Date: 09/24/2020 CLINICAL DATA:  Altered mental status, currently on ECMO EXAM: CT HEAD WITHOUT CONTRAST TECHNIQUE: Contiguous axial images were obtained from the base of the skull through the vertex without intravenous contrast. COMPARISON:  09/07/2020 FINDINGS: Brain: Minimal subarachnoid hemorrhage is noted in the left sylvian fissure. This is new from the prior exam. No other areas of subarachnoid hemorrhage are noted. Vascular: No hyperdense vessel or unexpected calcification. Skull: Normal. Negative for fracture or focal lesion. Sinuses/Orbits: No acute finding. Other: Fluid is noted within the mastoid air cells bilaterally new from the prior exam. IMPRESSION: New subarachnoid hemorrhage on the left in the sylvian fissure. Mastoid air cell effusions bilaterally. Critical Value/emergent results were called by telephone at the time of interpretation on 09/24/2020 at 10:56 am to Warden Fillers, ECMO specialist who will relay findings to Dr. Glori Bickers. Electronically Signed   By: Inez Catalina M.D.   On: 09/24/2020 10:57   CT CHEST W CONTRAST  Result Date: 09/24/2020 CLINICAL DATA:  ECMO patient, acute drop in hemoglobin, hypotension, retroperitoneal hematoma suspected EXAM: CT CHEST, ABDOMEN, AND PELVIS WITH CONTRAST TECHNIQUE: Multidetector CT imaging of the chest, abdomen and pelvis was performed following the standard protocol during bolus administration of intravenous contrast. CONTRAST:  124mL OMNIPAQUE IOHEXOL 300 MG/ML  SOLN COMPARISON:  None. FINDINGS: CT CHEST FINDINGS Cardiovascular: Right internal jugular approach veno venous ECMO cannula, tip positioned in the superior  abdominal inferior vena cava. Left upper extremity PICC, tip positioned within the mid SVC. Normal heart size. Left coronary artery calcifications. No pericardial effusion. Mediastinum/Nodes: No enlarged mediastinal, hilar, or axillary lymph nodes. Extensive pneumomediastinum. Tracheostomy. Thyroid and esophagus demonstrate no significant findings.  Lungs/Pleura: Examination of the lungs is significantly limited by breath motion artifact. There is very extensive, dense heterogeneous airspace opacity and consolidation throughout the lungs with small bilateral pleural effusions. No significant pneumothorax. Musculoskeletal: No chest wall mass or suspicious bone lesions identified. There is subpectoral emphysema about the chest wall (series 3, image 17). CT ABDOMEN PELVIS FINDINGS Hepatobiliary: No solid liver abnormality is seen. No gallstones, gallbladder wall thickening, or biliary dilatation. Pancreas: Unremarkable. No pancreatic ductal dilatation or surrounding inflammatory changes. Spleen: Normal in size without significant abnormality. Adrenals/Urinary Tract: Adrenal glands are unremarkable. Kidneys are normal, without renal calculi, solid lesion, or hydronephrosis. Foley catheter within the urinary bladder. Stomach/Bowel: Stomach is within normal limits. Enteric feeding tube is position with tip in the proximal jejunum. Appendix appears normal. No evidence of bowel wall thickening, distention, or inflammatory changes. Descending and sigmoid diverticulosis. Rectal tube. Vascular/Lymphatic: Aortic atherosclerosis. Right femoral venous catheter, tip position in the external iliac vein (series 3, image 107). No enlarged abdominal or pelvic lymph nodes. Reproductive: No mass or other abnormality. Other: No abdominal wall hernia or abnormality. Anasarca. Trace ascites. Musculoskeletal: No acute or significant osseous findings. IMPRESSION: 1. No evidence of retroperitoneal hematoma or other source of hemorrhage in the  chest, abdomen, or pelvis. 2. Extensive pneumomediastinum and subpectoral emphysema about the chest wall, likely due to barotrauma. 3. Examination of the lungs is significantly limited by breath motion artifact. There is very extensive, dense heterogeneous airspace opacity and consolidation throughout the lungs with small bilateral pleural effusions. Findings are most consistent with ARDS. 4. Support apparatus including tracheostomy and venovenous ECMO as detailed above. 5. Trace ascites and anasarca. 6. Coronary artery disease. Aortic Atherosclerosis (ICD10-I70.0). Electronically Signed   By: Eddie Candle M.D.   On: 09/24/2020 10:53   CT ABDOMEN PELVIS W CONTRAST  Result Date: 09/24/2020 CLINICAL DATA:  ECMO patient, acute drop in hemoglobin, hypotension, retroperitoneal hematoma suspected EXAM: CT CHEST, ABDOMEN, AND PELVIS WITH CONTRAST TECHNIQUE: Multidetector CT imaging of the chest, abdomen and pelvis was performed following the standard protocol during bolus administration of intravenous contrast. CONTRAST:  167mL OMNIPAQUE IOHEXOL 300 MG/ML  SOLN COMPARISON:  None. FINDINGS: CT CHEST FINDINGS Cardiovascular: Right internal jugular approach veno venous ECMO cannula, tip positioned in the superior abdominal inferior vena cava. Left upper extremity PICC, tip positioned within the mid SVC. Normal heart size. Left coronary artery calcifications. No pericardial effusion. Mediastinum/Nodes: No enlarged mediastinal, hilar, or axillary lymph nodes. Extensive pneumomediastinum. Tracheostomy. Thyroid and esophagus demonstrate no significant findings. Lungs/Pleura: Examination of the lungs is significantly limited by breath motion artifact. There is very extensive, dense heterogeneous airspace opacity and consolidation throughout the lungs with small bilateral pleural effusions. No significant pneumothorax. Musculoskeletal: No chest wall mass or suspicious bone lesions identified. There is subpectoral emphysema  about the chest wall (series 3, image 17). CT ABDOMEN PELVIS FINDINGS Hepatobiliary: No solid liver abnormality is seen. No gallstones, gallbladder wall thickening, or biliary dilatation. Pancreas: Unremarkable. No pancreatic ductal dilatation or surrounding inflammatory changes. Spleen: Normal in size without significant abnormality. Adrenals/Urinary Tract: Adrenal glands are unremarkable. Kidneys are normal, without renal calculi, solid lesion, or hydronephrosis. Foley catheter within the urinary bladder. Stomach/Bowel: Stomach is within normal limits. Enteric feeding tube is position with tip in the proximal jejunum. Appendix appears normal. No evidence of bowel wall thickening, distention, or inflammatory changes. Descending and sigmoid diverticulosis. Rectal tube. Vascular/Lymphatic: Aortic atherosclerosis. Right femoral venous catheter, tip position in the external iliac vein (series 3, image 107). No enlarged  abdominal or pelvic lymph nodes. Reproductive: No mass or other abnormality. Other: No abdominal wall hernia or abnormality. Anasarca. Trace ascites. Musculoskeletal: No acute or significant osseous findings. IMPRESSION: 1. No evidence of retroperitoneal hematoma or other source of hemorrhage in the chest, abdomen, or pelvis. 2. Extensive pneumomediastinum and subpectoral emphysema about the chest wall, likely due to barotrauma. 3. Examination of the lungs is significantly limited by breath motion artifact. There is very extensive, dense heterogeneous airspace opacity and consolidation throughout the lungs with small bilateral pleural effusions. Findings are most consistent with ARDS. 4. Support apparatus including tracheostomy and venovenous ECMO as detailed above. 5. Trace ascites and anasarca. 6. Coronary artery disease. Aortic Atherosclerosis (ICD10-I70.0). Electronically Signed   By: Eddie Candle M.D.   On: 09/24/2020 10:53   DG CHEST PORT 1 VIEW  Result Date: 09/24/2020 CLINICAL DATA:  Check  tracheostomy placement EXAM: PORTABLE CHEST 1 VIEW COMPARISON:  09/23/2020 FINDINGS: Cardiac shadow is stable. Tracheostomy tube, left-sided PICC line and feeding catheter are again noted and stable. ECMO cannula is noted extending into the IVC. Diffuse bilateral airspace disease is again noted and stable. Changes of pneumomediastinum are noted but decreased. Small right pleural effusion is noted. No bony abnormality is seen. IMPRESSION: Relatively stable appearance of the chest although some decrease in the degree of pneumomediastinum is noted. Electronically Signed   By: Inez Catalina M.D.   On: 09/24/2020 08:00   DG CHEST PORT 1 VIEW  Result Date: 09/23/2020 CLINICAL DATA:  ECMO. EXAM: PORTABLE CHEST 1 VIEW COMPARISON:  09/22/2020 FINDINGS: 0704 hours. Tracheostomy and feeding tubes again noted. ECMO cannula overlies the medial right chest. Right PICC line has been removed in the interval. Diffuse bilateral airspace disease ease again noted. Prominent pneumomediastinum persists. Left PICC line is new in the interval with tip overlying the SVC/RA junction. IMPRESSION: *New left PICC line tip overlies the SVC/RA junction. *Interval removal of right PICC line. *Persistent diffuse bilateral airspace disease with pneumomediastinum. Electronically Signed   By: Misty Stanley M.D.   On: 09/23/2020 08:41     Medications:     Scheduled Medications: . amLODipine  10 mg Per Tube QHS  . chlorhexidine gluconate (MEDLINE KIT)  15 mL Mouth Rinse BID  . Chlorhexidine Gluconate Cloth  6 each Topical Daily  . clonazePAM  2 mg Per Tube BID  . cloNIDine  0.2 mg Per Tube TID  . docusate  100 mg Per Tube BID  . feeding supplement (PROSource TF)  45 mL Per Tube TID  . fiber  1 packet Per Tube BID  . free water  100 mL Per Tube Q1H  . furosemide  40 mg Intravenous Daily  . hydrALAZINE  25 mg Per Tube TID AC  . insulin aspart  2-6 Units Subcutaneous Q4H  . insulin aspart  5 Units Subcutaneous Q4H  . insulin  detemir  25 Units Subcutaneous BID  . levothyroxine  50 mcg Per Tube Q0600  . mouth rinse  15 mL Mouth Rinse 10 times per day  . methylPREDNISolone (SOLU-MEDROL) injection  40 mg Intravenous Q24H  . metoprolol tartrate  50 mg Per Tube TID PC  . nystatin  5 mL Oral QID  . oxyCODONE  10 mg Per Tube Q6H  . pantoprazole sodium  40 mg Per Tube BID  . pravastatin  40 mg Per Tube q1800  . QUEtiapine  100 mg Per Tube BID  . sodium chloride flush  10-40 mL Intracatheter Q12H    Infusions: .  sodium chloride 10 mL/hr at 09/20/20 1900  . albumin human    . albumin human 12.5 g (09/23/20 0857)  . bivalirudin (ANGIOMAX) infusion 0.5 mg/mL (Non-ACS indications) 0.05 mg/kg/hr (09/25/20 0700)  . ceFEPime (MAXIPIME) IV Stopped (09/25/20 0546)  . dexmedetomidine (PRECEDEX) IV infusion Stopped (09/24/20 1900)  . dextrose    . feeding supplement (PIVOT 1.5 CAL) 1,000 mL (09/25/20 0400)  . fentaNYL infusion INTRAVENOUS 300 mcg/hr (09/25/20 0700)  . niCARDipine 3 mg/hr (09/25/20 0700)  . norepinephrine (LEVOPHED) Adult infusion Stopped (09/24/20 1016)  . propofol (DIPRIVAN) infusion 40 mcg/kg/min (09/25/20 0700)  . vancomycin Stopped (09/24/20 2302)    PRN Medications: acetaminophen (TYLENOL) oral liquid 160 mg/5 mL, albumin human, albuterol, diphenhydrAMINE, fentaNYL, Gerhardt's butt cream, heparin, labetalol, lip balm, LORazepam, ondansetron **OR** ondansetron (ZOFRAN) IV, phenylephrine, polyethylene glycol, sodium chloride flush   Assessment/Plan:   1. Acute hypoxic respiratory failure/ARDS in setting of COVID-19 PNA - Cannulated for VV ECMO on 12/8 after failing intubation x 2 days - has complete remedisivir, baricitinib, Solumedrol.  - s/p trach 12/16 - CT chest 12/26 with severe lung disease and pneumomediastinum. Lungs not recovering well.  - Respiratory status worse over the weeked. Sweep up 4 -> 8. Chest u/s with LLL infiltrate. Abx restarted. Cx redrawn 12/26 (mild budding yeast in trach  aspirate) - (Mero completed 12/21, vanc complete 12/20). Restarted vanc/cefepime 12/26 - circuit changed 12/13 for elevated LDH and lower flows. PLEX started 12/15. S/p PLEX #4/4 on 12/21. LDH stable - PTT goal turned down to 40-50 due to [facility]. Improved. Circuit looks ok. PTT 50 today. Discussed dosing with PharmD personally. - Mental status remains a major issue. Meds adjusted.  2. Bilateral DVT - remains on bival - Dosing d/w PharmD - no change  3. DM2 - Insulin adjusted. CBGs improved   4. Obesity -Body mass index is 33.73 kg/m.  5. F/E/N - Stable 150 - Free water adjusted  6. HTN - Remains very labile  - Now on nicardipine gtt - Improved with propofol for sedation   7. Rectal bleeding - follow closely. Rectal tube removed  8. AKI - Creatinine up slightly today in setting of labile BP and contrast exposure - follw with diureis  9. Acute encephalopathy - remains agitated  - head CT ok 09/07/20 and 12/21 - head CT 12/26. Small SAH  10. Small SAH - on CT 12/26 - repeat CT today - NSU consulted - PTT goal reduced 40-50 - avoid severe HTN as best as possible  Plan discussed on multi-discplinary ECMO rounds with CCM, ECMO coordinator/specialist, PharmDs and RNs.  CRITICAL CARE Performed by: Glori Bickers  CRITICAL CARE Performed by: Glori Bickers  Total critical care time: 45 minutes  Critical care time was exclusive of separately billable procedures and treating other patients.  Critical care was necessary to treat or prevent imminent or life-threatening deterioration.  Critical care was time spent personally by me (independent of midlevel providers or residents) on the following activities: development of treatment plan with patient and/or surrogate as well as nursing, discussions with consultants, evaluation of patient's response to treatment, examination of patient, obtaining history from patient or surrogate, ordering and performing treatments and  interventions, ordering and review of laboratory studies, ordering and review of radiographic studies, pulse oximetry and re-evaluation of patient's condition.    Length of Stay: 23   Glori Bickers MD 09/25/2020, 7:06 AM  Advanced Heart Failure Team Pager [phone] (M-F; [place])  Please contact [facility] for night-coverage after  hours (4p -7a ) and weekends on [email or web address]

## 2020-09-25 NOTE — Progress Notes (Signed)
Patient transported on vent to CT and returned to 2H23 without complications.

## 2020-09-25 NOTE — Progress Notes (Addendum)
NAME:  Donald Rowe, MRN:  [id number], DOB:  05-11-1961, LOS: 64 ADMISSION DATE:  09/04/2020, CONSULTATION DATE:  12/6 REFERRING MD:  Dr. Aileen Fass, CHIEF COMPLAINT:  SOB    Brief History   59 y/o M admitted 12/4 with 1 week hx of SOB, known COVID positive.  He is unvaccinated.  CTA chest negative for PE but demonstrated diffuse bilateral infiltrates. [place] started on 12/8   Past Medical History  Hypothyroidism  [facility] Events/Procedures  12/04 Admit  12/06 PCCM consulted  12/07 Intubated, central line, a line 12/08 VV ECMO Cannulaton, cortrak 12/13 circuit change for hemolytic anemia 12/16 Tracheostomy  12/17 bronch w/ BAL  Consults:  PCCM, Heart failure, TCTS, ECMO  Significant Diagnostic Tests:   CTA Chest 12/4 >> extensive bilateral airspace disease, no large PE identified, limited study   LE Venous Duplex 12/4 >> negative for DVT bilaterally   LE venous duplex 12/8> BLE DVTs  RUE venous duplex 12/23  RUE arterial duplex 12/23  CT head 12/26 : Minimal SAH on left side  CT abdomen and pelvis 12/26: Extensive bilateral airspace disease with small pleural effusion  Micro Data:  COVID 12/4 >> negative  Influenza A/B 12/4 >> negative  MRSA PCR 12/5 >> negative  BCx2 12/4 >> NG Trach aspirate 12/12> rare MRSA BAL 12/17> MRSA, candida Aspergillus Ag BAL 12/17> 0.05 12/18 sars2: positive  Antimicrobials:  Azithromycin 12/5 >> 12/6  Ceftriaxone 12/5 >> 12/6  Cefepime 12/7>12/14 vanc 12/14>12/20 Meropenem 12/17>12/21  Interim history/subjective:  Large fluctuations in BP yesterday. Significant drop in hemoglobin deemed secondary to rectal ulceration from FMS. Found to have trivial SAH on CT. Chest CT consistent with fibrosis.   Objective   Blood pressure (!) 127/42, pulse (!) 118, temperature 97.9 F (36.6 C), temperature source Core, resp. rate 16, height _0  (1.753 m), weight 119 kg, SpO2 94 %.    Vent Mode: PCV FiO2 (%):  [40  %-100 %] 60 % Set Rate:  [12 bmp] 12 bmp PEEP:  [10 cmH20] 10 cmH20 Plateau Pressure:  [18 cmH20-19 cmH20] 18 cmH20   Intake/Output Summary (Last 24 hours) at 09/25/2020 1012 Last data filed at 09/25/2020 0800 Gross per 24 hour  Intake 7316.73 ml  Output 2685 ml  Net 4631.73 ml   Filed Weights   09/23/20 0600 09/24/20 0600 09/25/20 0600  Weight: 111.9 kg 108.5 kg 119 kg    Examination: Gen: Well-developed, acutely ill-appearing obese male, lying on the bed  HEENT: NCAT, perrla, s/p trach Neck: supple, ECMO lines intact and functioning CV: Tachycardic, regular rhythm, no murmur  Pulm: Bilateral crackles, no wheezes or rhonchi Abd: Soft, BS+, NTND Extm: Peripheral pulses intact 2+ radial pulse on RUE, 1+ edema Skin: Dry, Warm,  Neuro: Eyes open but not following commands.  Nonpurposefully moving all 4 extremities.  Pupils 3 mm minimally reactive  Resolved Hospital Problem list     Assessment & Plan:   Acute hypoxemic and hypercarbic respiratory failure secondary to COVID PNA with severe ARDS. S/p VV ECMO cannulation 09/01/2020.  S/p tracheostomy 52/77 complicated by trach site bleeding improved Left-sided subarachnoid hemorrhage Community-acquired pneumonia with MRSA  Completed remdesivir.  Completed baricitinib.  c/w full ECMO support Continue Solu-Medrol 40 mg once daily, will plan for slow taper Completed for treatment with 4 sessions of Plex CRRT for fluid removal - may help with BP Repeat CT head today.  Agitated delirium requiring titration of IV sedation Titrate propofol Continue Precedex and fentanyl infusion Minimize sedation  with RASS goal 1 and tolerance of MV & ECMO Con't seroquel, oxycodone and clonazepam  Bilateral lower ext DVTs- provoked by COVID infection Continue bivalirudin,.   Steroid-induced hyperglycemia Fingersticks are better controlled now Decrease Levemir with steroid wean. Continue TF coverage (but decreasing) and SSI  PRN  Hypothyroidism con't PTA synthroid   Labile hypertension, contributed by periods of agitations Patient blood pressure remained labile ranging  Currently on clonidine, hydralazine and metoprolol Started on Cardene infusion  Hemolytic Anemia Throbocytopenia Hyperbilirubinemia Rheumatologic-associated MAHA remains in differential.  hgb and plt are stable Completed treatment with the Plex for 4 sessions Hemoglobin dropped to 7.4, received 2 PRBCs in last 24 hours Transfuse if hgb <8  Hypernatremia and hyperchloremia Azotemia Continue free water 100 cc an hour  Strict I/Os    Best practice (evaluated daily)  Diet: TF Pain/Anxiety/Delirium protocol (if indicated): Precedex and fentanyl VAP protocol (if indicated): yes DVT prophylaxis: Bivalirudin GI prophylaxis: pantoprazole  BID Glucose control: basal-bolus Mobility: As tolerated Last date of multidisciplinary goals of care discussion: wife updated over the phone 12/23 Summary of discussion con't aggressive care Follow up goals of care discussion due: 12/30 Code Status: full Disposition: ICU   Total critical care time:40  minutes   Critical care time was exclusive of separately billable procedures and treating other patients.   Critical care was necessary to treat or prevent imminent or life-threatening deterioration.   Critical care was time spent personally by me on the following activities: development of treatment plan with patient and/or surrogate as well as nursing, discussions with consultants, evaluation of patient's response to treatment, examination of patient, obtaining history from patient or surrogate, ordering and performing treatments and interventions, ordering and review of laboratory studies, ordering and review of radiographic studies, pulse oximetry and re-evaluation of patient's condition.   Kipp Brood, MD [facility] ICU Physician [place]  Pager: [phone] Or Epic Secure  Chat After hours: [phone].  09/25/2020, 10:18 AM

## 2020-09-25 NOTE — Progress Notes (Signed)
Called patients wife [name] and updated her on today's plan; all questions answered at this time.  Aline August, ECMO specialist

## 2020-09-25 NOTE — Progress Notes (Signed)
Occupational Therapy Treatment Patient Details Name: Donald Rowe MRN: [id number] DOB: 08-18-1961 Today's Date: 09/25/2020    History of present illness 59 y.o. male with a pertinent history of hypothyroidism on levothyroxine and hyperlipidemia who was diagnosed with Covid 11/27 and is not vaccinated.  With EMS SaO2 on RA 78%O2. ED patient was increased to 6 L oxygen satting about 92%,. CTA of the Chest was assessed and negative for pulmonary embolism but showed diffuse bilateral infiltrates. He was treated with IV remdesivir, steroids and baricitinib.  Initially, he was also treated with antibiotics but these were stopped on 12/6 with a low procalcitonin. 12/07 intubated, central line, A line, 12/08 ECMO cannulation, cortrak, attempted extubation 12/9 failed reintubated. Trach placed 12/16. CCRT started 12/26.   OT comments  Pt with eyes open slightly longer than previous visit and externally rotating arms and flexing elbows with more frequency. Pt now with femoral CRRT line. Stood pt in tilt bed at 35 degrees x 20 minutes with ECMO specialist managing lines. Pt with stable VS throughout.   Follow Up Recommendations  CIR    Equipment Recommendations  Other (comment) (defer to CIR)    Recommendations for Other Services      Precautions / Restrictions Precautions Precautions: Other (comment) Precaution Comments: ECMO, cortrak, vent via trach, condom cath, CRRT via femoral catheter       Mobility Bed Mobility               General bed mobility comments: Continued: Total A +2 for reposition in bed. Use of kreg tilt bed for semi standing. +2 to manage line and safety.  Transfers                 General transfer comment: used tilt bed to raise pt to 35 degrees in standing x 20 min with ECMO specialist monitoring CRRT and ECMO lines    Balance                                           ADL either performed or assessed with clinical judgement   ADL                                          General ADL Comments: total assist     Vision       Perception     Praxis      Cognition Arousal/Alertness: Lethargic Behavior During Therapy: Restless;Flat affect Overall Cognitive Status: Difficult to assess                                 General Comments: not following commands, opened eyes at end of session once back in supine, but did not respond to threat        Exercises General Exercises - Upper Extremity Shoulder Flexion: Left;10 reps;Standing Elbow Flexion: PROM;10 reps;Standing;Left Elbow Extension: 10 reps;Standing;Left;5 reps Wrist Flexion: PROM;Both;5 reps;Standing Wrist Extension: PROM;Both;Standing;5 reps Digit Composite Flexion: PROM;Both;5 reps;Standing Composite Extension: PROM;Both;5 reps;Standing Other Exercises Other Exercises: worked on head control in standing   Shoulder Instructions       General Comments      Pertinent Vitals/ Pain       Pain Assessment: Faces Faces Pain Scale: No hurt Pain  Location: no respose to pressure to nail bed in L hand for toes Pain Intervention(s): Monitored during session  Home Living                                          Prior Functioning/Environment              Frequency  Min 2X/week        Progress Toward Goals  OT Goals(current goals can now be found in the care plan section)  Progress towards OT goals: Not progressing toward goals - comment (pt with lethargy)  Acute Rehab OT Goals Patient Stated Goal: unable OT Goal Formulation: Patient unable to participate in goal setting Time For Goal Achievement: 09/26/20 Potential to Achieve Goals: Fair  Plan Discharge plan remains appropriate    Co-evaluation                 AM-PAC OT "6 Clicks" Daily Activity     Outcome Measure   Help from another person eating meals?: Total Help from another person taking care of personal grooming?:  Total Help from another person toileting, which includes using toliet, bedpan, or urinal?: Total Help from another person bathing (including washing, rinsing, drying)?: Total Help from another person to put on and taking off regular upper body clothing?: Total Help from another person to put on and taking off regular lower body clothing?: Total 6 Click Score: 6    End of Session    OT Visit Diagnosis: Unsteadiness on feet (R26.81);Other abnormalities of gait and mobility (R26.89);Muscle weakness (generalized) (M62.81);Pain   Activity Tolerance Patient tolerated treatment well (VSS)   Patient Left in bed;with call bell/phone within reach;with nursing/sitter in room   Nurse Communication Other (comment) (ECMO specialist managed lines)        Time: 3545-6256 OT Time Calculation (min): 34 min  Charges: OT General Charges $OT Visit: 1 Visit OT Treatments $Therapeutic Activity: 23-37 mins  Martie Round, OTR/L Acute Rehabilitation Services Pager: [phone] Office: [phone]   Evern Bio 09/25/2020, 3:41 PM

## 2020-09-25 NOTE — Progress Notes (Signed)
Assisted tele visit to patient with family member.  Donald Troung McEachran, RN  

## 2020-09-25 NOTE — Progress Notes (Addendum)
ANTICOAGULATION CONSULT NOTE  Pharmacy Consult for bivalirudin Indication: ECMO + bilateral DVTs + SAH 12/26   Recent Labs    09/23/20 0415 09/23/20 0421 09/24/20 0349 09/24/20 0356 09/24/20 1539 09/24/20 1930 09/24/20 1934 09/24/20 2245 09/25/20 0400 09/25/20 0404 09/25/20 0735 09/25/20 1555 09/25/20 1600  HGB 8.4*   < > 8.4*   < > 8.6*  --    < > 7.4* 8.3* 7.8* 8.2* 8.4*  --   HCT 27.2*   < > 27.6*   < > 28.3*  --    < > 24.3* 26.7* 23.0* 24.0* 26.2*  --   PLT 166   < > 151  --  145*  --   --  125* 136*  --   --  137*  --   APTT 57*   < > 61*   < > 41* 46*  --   --  50*  --   --  50*  --   LABPROT 18.2*  --  18.6*  --   --   --   --   --  18.1*  --   --   --   --   INR 1.6*  --  1.6*  --   --   --   --   --  1.6*  --   --   --   --   CREATININE 1.00   < > 1.07  --  1.15  --   --   --  1.35*  --   --   --  1.13   < > = values in this interval not displayed.    Estimated Creatinine Clearance: 87 mL/min (by C-G formula based on SCr of 1.13 mg/dL).   Assessment: 85 yom unvaccinated presenting with severe COVID ARDs, intubated on 12/6, continued to have ventilation issues despite NMB and proning - started on VV ECMO on 12/8.  D-dimer >20 > now improved 14, found to have bilateral DVTs.   Has been on bivalirudin drip 0.1mg /kg/hr with aptts at low end of goal 50-60.   S/p Head CT 12/26 with new small SAH.  Held bivalirudin x3hr and resume at lower rate with lower goal 40-50sec until follow up CT to ensure not expanding. Overnight some rectal bleeding > rectal tube removed, 1unit blood CBC stable this am, fibrinogen stable 300-400s, LDH stable 400-500s  aPTT remains  50 seconds and at goal this evening on bivalirudin 0.05mg /kg/hr. Per RN one small rectal clot noted today but no other bleeding noted. Hgb 8.4 - stable from this morning. CT head found no change in small volume of SAH and no evidence of new hemorrhage.  Goal of Therapy:  aPTT 40-50 seconds (new 12/26)  Monitor  platelets by anticoagulation protocol: Yes   Plan:  Continue IV bivalirudin to 0.05 mg/kg/hr  Check  q12h aPTT and CBC  Follow up bleeding    Gerrit Halls, PharmD Clinical Pharmacist  09/25/2020 5:29 PM

## 2020-09-25 NOTE — Procedures (Signed)
Extracorporeal support note     ECLS cannulation: 04-Oct-2020 Last circuit change: 09/11/2020  Indication: COVD ARDS   Configuration: VV, RIJ 32Fr [place]   Pump speed: 3800 Pump flow: 4.4L  Pump used: Cardiohelp   Sweep gas: 100%,5LPM   Circuit check:  No clot noted Anticoagulant: bivalirudin   Changes in support: continue current support.   Anticipated goals/duration of support: bridge to recovery   Lynnell Catalan, MD [facility] ICU Physician [facility]  Critical Care  Pager: [phone] Or Epic Secure Chat After hours: [phone].  09/25/2020, 10:20 AM

## 2020-09-26 ENCOUNTER — Inpatient Hospital Stay (HOSPITAL_COMMUNITY): Payer: HRSA Program

## 2020-09-26 DIAGNOSIS — J80 Acute respiratory distress syndrome: Secondary | ICD-10-CM | POA: Diagnosis not present

## 2020-09-26 DIAGNOSIS — Z515 Encounter for palliative care: Secondary | ICD-10-CM | POA: Diagnosis not present

## 2020-09-26 DIAGNOSIS — Z7189 Other specified counseling: Secondary | ICD-10-CM | POA: Diagnosis not present

## 2020-09-26 DIAGNOSIS — E870 Hyperosmolality and hypernatremia: Secondary | ICD-10-CM | POA: Diagnosis not present

## 2020-09-26 DIAGNOSIS — U071 COVID-19: Secondary | ICD-10-CM | POA: Diagnosis not present

## 2020-09-26 DIAGNOSIS — J9601 Acute respiratory failure with hypoxia: Secondary | ICD-10-CM | POA: Diagnosis not present

## 2020-09-26 LAB — TYPE AND SCREEN
ABO/RH(D): O POS
Antibody Screen: NEGATIVE
Unit division: 0
Unit division: 0
Unit division: 0
Unit division: 0
Unit division: 0
Unit division: 0
Unit division: 0
Unit division: 0
Unit division: 0

## 2020-09-26 LAB — POCT I-STAT 7, (LYTES, BLD GAS, ICA,H+H)
Acid-Base Excess: 2 mmol/L (ref 0.0–2.0)
Acid-Base Excess: 1 mmol/L (ref 0.0–2.0)
Acid-Base Excess: 1 mmol/L (ref 0.0–2.0)
Acid-Base Excess: 0 mmol/L (ref 0.0–2.0)
Acid-Base Excess: 0 mmol/L (ref 0.0–2.0)
Acid-Base Excess: 0 mmol/L (ref 0.0–2.0)
Acid-Base Excess: 0 mmol/L (ref 0.0–2.0)
Acid-base deficit: 1 mmol/L (ref 0.0–2.0)
Bicarbonate: 27.5 mmol/L (ref 20.0–28.0)
Bicarbonate: 27.1 mmol/L (ref 20.0–28.0)
Bicarbonate: 27.7 mmol/L (ref 20.0–28.0)
Bicarbonate: 26.5 mmol/L (ref 20.0–28.0)
Bicarbonate: 27.3 mmol/L (ref 20.0–28.0)
Bicarbonate: 26.2 mmol/L (ref 20.0–28.0)
Bicarbonate: 26.7 mmol/L (ref 20.0–28.0)
Bicarbonate: 26.8 mmol/L (ref 20.0–28.0)
Calcium, Ion: 1.18 mmol/L (ref 1.15–1.40)
Calcium, Ion: 1.18 mmol/L (ref 1.15–1.40)
Calcium, Ion: 1.2 mmol/L (ref 1.15–1.40)
Calcium, Ion: 1.2 mmol/L (ref 1.15–1.40)
Calcium, Ion: 1.2 mmol/L (ref 1.15–1.40)
Calcium, Ion: 1.23 mmol/L (ref 1.15–1.40)
Calcium, Ion: 1.15 mmol/L (ref 1.15–1.40)
Calcium, Ion: 1.18 mmol/L (ref 1.15–1.40)
HCT: 25 % — ABNORMAL LOW (ref 39.0–52.0)
HCT: 23 % — ABNORMAL LOW (ref 39.0–52.0)
HCT: 26 % — ABNORMAL LOW (ref 39.0–52.0)
HCT: 23 % — ABNORMAL LOW (ref 39.0–52.0)
HCT: 24 % — ABNORMAL LOW (ref 39.0–52.0)
HCT: 28 % — ABNORMAL LOW (ref 39.0–52.0)
HCT: 26 % — ABNORMAL LOW (ref 39.0–52.0)
HCT: 26 % — ABNORMAL LOW (ref 39.0–52.0)
Hemoglobin: 8.5 g/dL — ABNORMAL LOW (ref 13.0–17.0)
Hemoglobin: 7.8 g/dL — ABNORMAL LOW (ref 13.0–17.0)
Hemoglobin: 8.8 g/dL — ABNORMAL LOW (ref 13.0–17.0)
Hemoglobin: 7.8 g/dL — ABNORMAL LOW (ref 13.0–17.0)
Hemoglobin: 8.2 g/dL — ABNORMAL LOW (ref 13.0–17.0)
Hemoglobin: 9.5 g/dL — ABNORMAL LOW (ref 13.0–17.0)
Hemoglobin: 8.8 g/dL — ABNORMAL LOW (ref 13.0–17.0)
Hemoglobin: 8.8 g/dL — ABNORMAL LOW (ref 13.0–17.0)
O2 Saturation: 97 %
O2 Saturation: 98 %
O2 Saturation: 96 %
O2 Saturation: 97 %
O2 Saturation: 96 %
O2 Saturation: 92 %
O2 Saturation: 96 %
O2 Saturation: 93 %
Patient temperature: 37
Patient temperature: 37.3
Patient temperature: 37.4
Patient temperature: 36.5
Patient temperature: 36.4
Patient temperature: 37
Potassium: 4.1 mmol/L (ref 3.5–5.1)
Potassium: 4.2 mmol/L (ref 3.5–5.1)
Potassium: 4.3 mmol/L (ref 3.5–5.1)
Potassium: 4.6 mmol/L (ref 3.5–5.1)
Potassium: 5 mmol/L (ref 3.5–5.1)
Potassium: 5 mmol/L (ref 3.5–5.1)
Potassium: 5.2 mmol/L — ABNORMAL HIGH (ref 3.5–5.1)
Potassium: 5 mmol/L (ref 3.5–5.1)
Sodium: 143 mmol/L (ref 135–145)
Sodium: 143 mmol/L (ref 135–145)
Sodium: 143 mmol/L (ref 135–145)
Sodium: 142 mmol/L (ref 135–145)
Sodium: 141 mmol/L (ref 135–145)
Sodium: 140 mmol/L (ref 135–145)
Sodium: 141 mmol/L (ref 135–145)
Sodium: 141 mmol/L (ref 135–145)
TCO2: 29 mmol/L (ref 22–32)
TCO2: 29 mmol/L (ref 22–32)
TCO2: 29 mmol/L (ref 22–32)
TCO2: 28 mmol/L (ref 22–32)
TCO2: 29 mmol/L (ref 22–32)
TCO2: 28 mmol/L (ref 22–32)
TCO2: 28 mmol/L (ref 22–32)
TCO2: 28 mmol/L (ref 22–32)
pCO2 arterial: 48.9 mmHg — ABNORMAL HIGH (ref 32.0–48.0)
pCO2 arterial: 48 mmHg (ref 32.0–48.0)
pCO2 arterial: 55.4 mmHg — ABNORMAL HIGH (ref 32.0–48.0)
pCO2 arterial: 52.1 mmHg — ABNORMAL HIGH (ref 32.0–48.0)
pCO2 arterial: 59.9 mmHg — ABNORMAL HIGH (ref 32.0–48.0)
pCO2 arterial: 53.9 mmHg — ABNORMAL HIGH (ref 32.0–48.0)
pCO2 arterial: 50.3 mmHg — ABNORMAL HIGH (ref 32.0–48.0)
pCO2 arterial: 52.6 mmHg — ABNORMAL HIGH (ref 32.0–48.0)
pH, Arterial: 7.358 (ref 7.350–7.450)
pH, Arterial: 7.362 (ref 7.350–7.450)
pH, Arterial: 7.309 — ABNORMAL LOW (ref 7.350–7.450)
pH, Arterial: 7.315 — ABNORMAL LOW (ref 7.350–7.450)
pH, Arterial: 7.266 — ABNORMAL LOW (ref 7.350–7.450)
pH, Arterial: 7.292 — ABNORMAL LOW (ref 7.350–7.450)
pH, Arterial: 7.33 — ABNORMAL LOW (ref 7.350–7.450)
pH, Arterial: 7.316 — ABNORMAL LOW (ref 7.350–7.450)
pO2, Arterial: 94 mmHg (ref 83.0–108.0)
pO2, Arterial: 103 mmHg (ref 83.0–108.0)
pO2, Arterial: 91 mmHg (ref 83.0–108.0)
pO2, Arterial: 95 mmHg (ref 83.0–108.0)
pO2, Arterial: 98 mmHg (ref 83.0–108.0)
pO2, Arterial: 71 mmHg — ABNORMAL LOW (ref 83.0–108.0)
pO2, Arterial: 84 mmHg (ref 83.0–108.0)
pO2, Arterial: 73 mmHg — ABNORMAL LOW (ref 83.0–108.0)

## 2020-09-26 LAB — BASIC METABOLIC PANEL
Anion gap: 8 (ref 5–15)
BUN: 68 mg/dL — ABNORMAL HIGH (ref 6–20)
CO2: 25 mmol/L (ref 22–32)
Calcium: 7.7 mg/dL — ABNORMAL LOW (ref 8.9–10.3)
Chloride: 108 mmol/L (ref 98–111)
Creatinine, Ser: 0.95 mg/dL (ref 0.61–1.24)
GFR, Estimated: >60 mL/min (ref >60)
Glucose, Bld: 85 mg/dL (ref 70–99)
Potassium: 4.2 mmol/L (ref 3.5–5.1)
Sodium: 141 mmol/L (ref 135–145)

## 2020-09-26 LAB — BPAM RBC
Blood Product Expiration Date: 202201152359
Blood Product Expiration Date: 202201252359
Blood Product Expiration Date: 202201262359
Blood Product Expiration Date: 202201272359
Blood Product Expiration Date: 202201272359
Blood Product Expiration Date: 202201272359
Blood Product Expiration Date: 202201272359
Blood Product Expiration Date: 202201272359
Blood Product Expiration Date: 202202012359
ISSUE DATE / TIME: 202112250031
ISSUE DATE / TIME: 202112251116
ISSUE DATE / TIME: 202112252215
ISSUE DATE / TIME: 202112260837
ISSUE DATE / TIME: 202112270007
ISSUE DATE / TIME: 202112270913
ISSUE DATE / TIME: 202112270913
ISSUE DATE / TIME: 202112270913
ISSUE DATE / TIME: 202112272107
Unit Type and Rh: 5100
Unit Type and Rh: 5100
Unit Type and Rh: 5100
Unit Type and Rh: 5100
Unit Type and Rh: 5100
Unit Type and Rh: 5100
Unit Type and Rh: 5100
Unit Type and Rh: 5100
Unit Type and Rh: 5100

## 2020-09-26 LAB — LACTIC ACID, PLASMA
Lactic Acid, Venous: 0.9 mmol/L (ref 0.5–1.9)
Lactic Acid, Venous: 0.7 mmol/L (ref 0.5–1.9)

## 2020-09-26 LAB — CBC
HCT: 27.5 % — ABNORMAL LOW (ref 39.0–52.0)
HCT: 25.7 % — ABNORMAL LOW (ref 39.0–52.0)
Hemoglobin: 8.7 g/dL — ABNORMAL LOW (ref 13.0–17.0)
Hemoglobin: 8.3 g/dL — ABNORMAL LOW (ref 13.0–17.0)
MCH: 30.2 pg (ref 26.0–34.0)
MCH: 30.9 pg (ref 26.0–34.0)
MCHC: 31.6 g/dL (ref 30.0–36.0)
MCHC: 32.3 g/dL (ref 30.0–36.0)
MCV: 95.5 fL (ref 80.0–100.0)
MCV: 95.5 fL (ref 80.0–100.0)
Platelets: 131 10*3/uL — ABNORMAL LOW (ref 150–400)
Platelets: 108 10*3/uL — ABNORMAL LOW (ref 150–400)
RBC: 2.88 MIL/uL — ABNORMAL LOW (ref 4.22–5.81)
RBC: 2.69 MIL/uL — ABNORMAL LOW (ref 4.22–5.81)
RDW: 18.3 % — ABNORMAL HIGH (ref 11.5–15.5)
RDW: 18.2 % — ABNORMAL HIGH (ref 11.5–15.5)
WBC: 9 10*3/uL (ref 4.0–10.5)
WBC: 7.7 10*3/uL (ref 4.0–10.5)
nRBC: 0.4 % — ABNORMAL HIGH (ref 0.0–0.2)
nRBC: 0.4 % — ABNORMAL HIGH (ref 0.0–0.2)

## 2020-09-26 LAB — RENAL FUNCTION PANEL
Albumin: 2.7 g/dL — ABNORMAL LOW (ref 3.5–5.0)
Albumin: 2.8 g/dL — ABNORMAL LOW (ref 3.5–5.0)
Anion gap: 7 (ref 5–15)
Anion gap: 7 (ref 5–15)
BUN: 70 mg/dL — ABNORMAL HIGH (ref 6–20)
BUN: 57 mg/dL — ABNORMAL HIGH (ref 6–20)
CO2: 26 mmol/L (ref 22–32)
CO2: 25 mmol/L (ref 22–32)
Calcium: 7.8 mg/dL — ABNORMAL LOW (ref 8.9–10.3)
Calcium: 7.8 mg/dL — ABNORMAL LOW (ref 8.9–10.3)
Chloride: 109 mmol/L (ref 98–111)
Chloride: 108 mmol/L (ref 98–111)
Creatinine, Ser: 0.92 mg/dL (ref 0.61–1.24)
Creatinine, Ser: 0.83 mg/dL (ref 0.61–1.24)
GFR, Estimated: >60 mL/min (ref >60)
GFR, Estimated: >60 mL/min (ref >60)
Glucose, Bld: 96 mg/dL (ref 70–99)
Glucose, Bld: 151 mg/dL — ABNORMAL HIGH (ref 70–99)
Phosphorus: 2.9 mg/dL (ref 2.5–4.6)
Phosphorus: 4.2 mg/dL (ref 2.5–4.6)
Potassium: 4.1 mmol/L (ref 3.5–5.1)
Potassium: 5 mmol/L (ref 3.5–5.1)
Sodium: 142 mmol/L (ref 135–145)
Sodium: 140 mmol/L (ref 135–145)

## 2020-09-26 LAB — HEMOGLOBIN AND HEMATOCRIT, BLOOD
HCT: 23.8 % — ABNORMAL LOW (ref 39.0–52.0)
Hemoglobin: 7.5 g/dL — ABNORMAL LOW (ref 13.0–17.0)

## 2020-09-26 LAB — HEPATIC FUNCTION PANEL
ALT: 39 U/L (ref 0–44)
AST: 49 U/L — ABNORMAL HIGH (ref 15–41)
Albumin: 2.6 g/dL — ABNORMAL LOW (ref 3.5–5.0)
Alkaline Phosphatase: 112 U/L (ref 38–126)
Bilirubin, Direct: 0.7 mg/dL — ABNORMAL HIGH (ref 0.0–0.2)
Indirect Bilirubin: 1 mg/dL — ABNORMAL HIGH (ref 0.3–0.9)
Total Bilirubin: 1.7 mg/dL — ABNORMAL HIGH (ref 0.3–1.2)
Total Protein: 5.1 g/dL — ABNORMAL LOW (ref 6.5–8.1)

## 2020-09-26 LAB — PROTIME-INR
INR: 1.4 — ABNORMAL HIGH (ref 0.8–1.2)
Prothrombin Time: 16.3 seconds — ABNORMAL HIGH (ref 11.4–15.2)

## 2020-09-26 LAB — FIBRINOGEN: Fibrinogen: 389 mg/dL (ref 210–475)

## 2020-09-26 LAB — LACTATE DEHYDROGENASE: LDH: 438 U/L — ABNORMAL HIGH (ref 98–192)

## 2020-09-26 LAB — VANCOMYCIN, TROUGH: Vancomycin Tr: 19 ug/mL (ref 15–20)

## 2020-09-26 LAB — GLUCOSE, CAPILLARY
Glucose-Capillary: 95 mg/dL (ref 70–99)
Glucose-Capillary: 110 mg/dL — ABNORMAL HIGH (ref 70–99)
Glucose-Capillary: 112 mg/dL — ABNORMAL HIGH (ref 70–99)
Glucose-Capillary: 103 mg/dL — ABNORMAL HIGH (ref 70–99)

## 2020-09-26 LAB — APTT
aPTT: 53 seconds — ABNORMAL HIGH (ref 24–36)
aPTT: 46 seconds — ABNORMAL HIGH (ref 24–36)

## 2020-09-26 LAB — TRIGLYCERIDES: Triglycerides: 82 mg/dL (ref <150)

## 2020-09-26 LAB — MAGNESIUM: Magnesium: 3.5 mg/dL — ABNORMAL HIGH (ref 1.7–2.4)

## 2020-09-26 MED ORDER — B COMPLEX-C PO TABS: 1.0000 | ORAL_TABLET | Freq: Every day | ORAL | Status: DC

## 2020-09-26 MED ORDER — CLONAZEPAM 1 MG PO TABS: 2.0000 mg | ORAL_TABLET | Freq: Three times a day (TID) | ORAL | Status: DC

## 2020-09-26 MED ORDER — CLONAZEPAM 1 MG PO TABS
2.0000 mg | ORAL_TABLET | Freq: Three times a day (TID) | ORAL | Status: DC
  Administered 2020-09-26 – 2020-10-05 (×25): 2 mg
  Filled 2020-09-26 (×25): qty 2

## 2020-09-26 MED ORDER — HALOPERIDOL LACTATE 5 MG/ML IJ SOLN
5.0000 mg | Freq: Four times a day (QID) | INTRAMUSCULAR | Status: DC | PRN
  Administered 2020-09-26 – 2020-10-02 (×2): 5 mg via INTRAVENOUS
  Filled 2020-09-26 (×2): qty 1

## 2020-09-26 MED ORDER — B COMPLEX-C PO TABS
1.0000 | ORAL_TABLET | Freq: Every day | ORAL | Status: DC
  Administered 2020-09-26 – 2020-10-03 (×7): 1
  Filled 2020-09-26 (×7): qty 1

## 2020-09-26 MED ORDER — QUETIAPINE FUMARATE 100 MG PO TABS
200.0000 mg | ORAL_TABLET | Freq: Two times a day (BID) | ORAL | Status: DC
  Administered 2020-09-26 (×2): 200 mg
  Filled 2020-09-26 (×2): qty 2

## 2020-09-26 MED ORDER — NYSTATIN 100000 UNIT/ML MT SUSP
5.0000 mL | Freq: Four times a day (QID) | OROMUCOSAL | Status: DC
  Administered 2020-09-26 – 2020-10-12 (×63): 500000 [IU]
  Filled 2020-09-26 (×65): qty 5

## 2020-09-26 MED ORDER — METOPROLOL TARTRATE 25 MG/10 ML ORAL SUSPENSION
25.0000 mg | Freq: Two times a day (BID) | ORAL | Status: DC
  Administered 2020-09-27 – 2020-09-29 (×5): 25 mg
  Filled 2020-09-26 (×5): qty 10

## 2020-09-26 MED ORDER — CLONAZEPAM 1 MG PO TABS
2.0000 mg | ORAL_TABLET | Freq: Three times a day (TID) | ORAL | Status: DC
  Administered 2020-09-26 (×2): 2 mg
  Filled 2020-09-26 (×2): qty 2

## 2020-09-26 MED ORDER — CHLORHEXIDINE GLUCONATE 0.12 % MT SOLN
OROMUCOSAL | Status: AC
  Administered 2020-09-26: 08:00:00 15 mL via OROMUCOSAL
  Filled 2020-09-26: qty 15

## 2020-09-26 MED ORDER — CLONAZEPAM 1 MG PO TABS: 2.0000 mg | ORAL_TABLET | Freq: Two times a day (BID) | ORAL | Status: DC

## 2020-09-26 MED ORDER — QUETIAPINE FUMARATE 100 MG PO TABS: 100.0000 mg | ORAL_TABLET | Freq: Two times a day (BID) | ORAL | Status: DC

## 2020-09-26 MED ORDER — PROSOURCE TF PO LIQD
45.0000 mL | Freq: Four times a day (QID) | ORAL | Status: DC
  Administered 2020-09-26 – 2020-10-12 (×61): 45 mL
  Filled 2020-09-26 (×62): qty 45

## 2020-09-26 NOTE — Progress Notes (Signed)
Nutrition Follow Up  DOCUMENTATION CODES:   Not applicable  INTERVENTION:   Continue tube feeding:  -Pivot 1.5 @ 70 ml/hr via Cortrak (1680 ml) -ProSource TF 45 ml QID -Free water flushes 100 ml every hour  Provides: 2680 kcal, 202 grams protein, 1260 ml free water (3660 ml with flushes).   Add B complex with Vitamin C to account for losses with CRRT  NUTRITION DIAGNOSIS:   Increased nutrient needs related to acute illness (COVID PNA) as evidenced by estimated needs   Ongoing  GOAL:   Patient will meet greater than or equal to 90% of their needs   Addressed via TF  MONITOR:   Vent status,Skin,Weight trends,Labs,I & O's,TF tolerance  REASON FOR ASSESSMENT:   Ventilator,Consult Enteral/tube feeding initiation and management  ASSESSMENT:   Patient with PMH significant for asthma, cancer, CHF, DM, HTN, sickle cell anemia, renal insufficiency, and hyperlipidemia. Presents this admission with severe COVID ARDS.   12/04- admit [facility]- hypoxia, intubated 12/07- transfer to [facility] for ECMO 12/08- VV ECMO cannulation, post pyloric Cortrak placed 12/09- extubated, re-intubated  12/13- circuit change 12/15- started PLEX 12/16- trach 12/27- started CRRT   Pt discussed during ICU rounds and with RN.   Off pressors. Done with PLEX treatments. Continues on CRRT- pulling 100-150 ml/hr. Hypernatremia improved. Steroids being tapered, follow CBG readings. No BM x 3 days, colace added today per provider. Abdomen slightly less distended than yesterday. If no BM result in next 24-48 hrs, consider suppository. Increase protein to account for CRRT.   Patient requiring ventilator support via trach MV: 7.3 L/min Temp (24hrs), Avg:97.9 F (36.6 C), Min:97.34 F (36.3 C), Max:99.3 F (37.4 C)   Admission weight: 101.9 kg  Current weight: 110.4 kg   UOP: 1910 ml x 24 hrs  CRRT: 6368 ml x 24 hrs   Drips: albumin, propofol (9.3- weaning) Medications: colace, nutrisource fiber  BID, SS novolog, levemir, solumedrol Labs: CBG 85-146  Diet Order:   Diet Order    None      EDUCATION NEEDS:   Not appropriate for education at this time  Skin:  Skin Assessment: Skin Integrity Issues: Skin Integrity Issues:: Other (Comment) Other: skin tear- R ear  Last BM:  12/25  Height:   Ht Readings from Last 1 Encounters:  09/05/20 5\' 9"  (1.753 m)    Weight:   Wt Readings from Last 1 Encounters:  09/26/20 110.4 kg    Adjusted Body Weight:  84.5 kg   BMI:  Body mass index is 35.94 kg/m.  Estimated Nutritional Needs:   Kcal:  09/28/20 kcal  Protein:  160-205 grams  Fluid:  >/= 2 L/day  [phone] RD, LDN Clinical Nutrition Pager listed in AMION

## 2020-09-26 NOTE — Progress Notes (Signed)
Palliative:  HPI:59 y.o.malewith a pertinent history ofhypothyroidism on levothyroxine and hyperlipidemia who was diagnosed with Covid 1 week ago and is not vaccinated. With EMS he was seen to bedesaturatingto 78% on room air. He states he started having symptoms on Friday and tested positive on Saturday.Had initially elected to be DNAR without intubation though later changed this and got intubated on 12/6 in the setting of worsening respiratory failure with hypoxia. CTA chest w/diffuse bilateral infiltrates.On 12/8 was cannulated for VV ECMO.Had been extubated on 12/9 then emergently reintubated in early eveningdue to clinical instability. Palliative care was asked to get involved in the setting of VV ECMO to set goals and expectations withJ.R.'sfamily.He continues on VV ECMO and ventilator support.ECMO circuit change 09/11/20.PLEX began 12/15.    Met today at [name]'s bedside and no family present. He continues on vent and ECMO. He had decline over the weekend with increased ECMO support and evidence of small SAH. CT chest with ongoing severe lung disease. Began antibiotics 09/24/20 again for new infiltrate left lower lung along with increased WBC. Cultures sent and final results pending.   I called and spoke with wife, Donald Rowe. We discussed disappointment that he had decline over the weekend and hopes that he would have continued to wean from ECMO support. Donald Rowe understands that everyone continues to be concerned for [name]'s neurological status. We discussed that there is little more testing that can be done as he cannot have MRI with ECMO machine. She discussed an incidence at the beginning of his admission where he had a moment of hypoxia but this is noted to be very brief and I would not consider this significant or long enough to cause anoxic brain injury as he was noted to still be in full capacity after this event as well (able to reverse code status in PCCM consultation 09/04/20). However,  it is difficult to know for sure without MRI of the cumulative effects of the course of his severe illness. Although we are treating this as severe delirium at this time in the absence of any further clear etiology.   Donald Rowe also spent some time discussing her concerns for the amount of sedating medications and how this is leading to worsening mental status. We discussed that this is a concern but with his agitation and hypertension there may not be much that can be done as they have to ensure that he is calm enough for the ventilator and ECMO supports to be effective for him. However, I will address with team tomorrow if there are any plans for minimizing or changing these medications. I do know that nursing is attempting to follow trends for any medications that may be more effective or cause worsening hypotension or side effects for him but without much success thus far. I will speak with team tomorrow regarding her concerns.    Exam: Sedated on ventvia trach. VV ECMO. Cortrak in place.No distress.50% FiO2 and tolerating ventwith no dyssynchrony.PICC moved to LUE. RUE looks better today; no evidence of clot. Abd soft.  Plan: - Continue aggressive care with hopes of improvement.   3545-6256 60 min  Donald Salvatierra, NP Palliative Medicine Team Pager [phone] (Please see [email or web address] for schedule) Team Phone [phone]    Greater than 50%  of this time was spent counseling and coordinating care related to the above assessment and plan

## 2020-09-26 NOTE — Progress Notes (Signed)
This chaplain is present outside the Pt. room per the Pt. wife-[name]'s request for prayer and ongoing spiritual care.

## 2020-09-26 NOTE — Procedures (Signed)
Extracorporeal support note     ECLS cannulation: Sep 26, 2020 Last circuit change: 09/11/2020  Indication: COVD ARDS   Configuration: VV, RIJ 32Fr [place]   Pump speed: 3351 Pump flow: 4.11 L Pump used: Cardiohelp   Sweep gas: 100%, 9 LPM   Circuit check:  No clot noted Anticoagulant: bivalirudin   Changes in support: continue current support.  Will decrease sweep to 8 continue to wean per gas  Anticipated goals/duration of support: bridge to recovery, day 20 of support.   Lynnell Catalan, MD [facility] ICU Physician [facility] Critical Care  Pager: [phone] Or Epic Secure Chat After hours: [phone].  09/26/2020, 10:32 AM

## 2020-09-26 NOTE — Progress Notes (Signed)
Patient ID: Donald Rowe, male   DOB: 25-Jul-1961, 59 y.o.   MRN: [id number]    Advanced Heart Failure Rounding Note   Subjective:    12/8 Cannulated for VV ECMO - 32 FR  RIJ [facility] 12/9 Extubated/Reintubated for altered mental status 12/13 Circuit change for elevated LDH 12/15 PLEX started 12/16 Tracheostomy 12/23 head CT (normal) for change in mental status.  12/26 Pan CT for drop in hgb. Small SAH. Severe lung disease 12/27 CVVH begun  He received 1 more unit PRBCs overnight without overt bleeding evidence, hgb 8.7 today.   Still no purposeful responses but gets agitated and moves a lot in bed.  He is currently sedated on propofol and Fentanyl.   MAP high today, on nicardipine 5 mg/hr. Solumedrol 20 mg daily.   CVVH started yesterday due to intractable volume overload, pulling 100-150 cc/hr net negative.  Some chugging in circuit, decreased speed to 3300 rpm and stable. Sweep down to 8.  CXR improved this morning with CVVH, weight down 4 lbs.    Had PLEX 4/4 12/22.   ECMO   Speed 3300 Flow 4.11L Sweep 8 dP 20 pVen -80  ABG: 7.36/48/103/98% Hgb 8.7 LDH 434 -> 544 -> 419 -> 551 -> 579 -> 590 -> 438 Lactic acid 0.9 PTT 53  Vent 40%   Objective:   Weight Range:  Vital Signs:   Temp:  [97.3 F (36.3 C)-98.6 F (37 C)] 98.6 F (37 C) (12/28 0400) Pulse Rate:  [90-128] 101 (12/28 0700) Resp:  [2-41] 13 (12/28 0700) BP: (92-191)/(41-89) 104/68 (12/28 0700) SpO2:  [88 %-100 %] 99 % (12/28 0700) Arterial Line BP: (100-187)/(40-74) 143/48 (12/28 0700) FiO2 (%):  [50 %-60 %] 50 % (12/28 0445) Weight:  [110.4 kg-112.4 kg] 110.4 kg (12/28 0500) Last BM Date: 09/23/20  Weight change: Filed Weights   09/25/20 0600 09/25/20 1045 09/26/20 0500  Weight: 119 kg 112.4 kg 110.4 kg    Intake/Output:   Intake/Output Summary (Last 24 hours) at 09/26/2020 0739 Last data filed at 09/26/2020 0700 Gross per 24 hour  Intake 6299.83 ml  Output 8278 ml  Net -1978.17 ml      Physical Exam: General: Sedated on vent.  Neck: Tracheostomy.  JVP 10 cm, no thyromegaly or thyroid nodule.  Lungs: Decreased bilaterally with rhonchi.  CV: Nondisplaced PMI.  Heart tachy, regular S1/S2, no S3/S4, no murmur.  Trace ankle edema.  Abdomen: Soft, nontender, no hepatosplenomegaly, no distention.  Skin: Intact without lesions or rashes.  Neurologic: Moves all limbs but no purposeful response. Extremities: No clubbing or cyanosis.  HEENT: Normal.   Telemetry: sinus 110s-130s Personally reviewed    Labs: Basic Metabolic Panel: Recent Labs  Lab 09/24/20 0349 09/24/20 0356 09/24/20 1539 09/24/20 1934 09/25/20 0400 09/25/20 0404 09/25/20 1600 09/25/20 1604 09/25/20 1723 09/25/20 1941 09/26/20 0403 09/26/20 0405 09/26/20 0644  NA 151*   < > 149*   < > 150*   < > 146*   < > 146* 145 142 143 143  K 5.4*   < > 5.2*   < > 4.0   < > 4.7   < > 5.1 5.1 4.1 4.1 4.2  CL 113*  --  112*  --  114*  --  113*  --   --   --  109  --   --   CO2 31  --  28  --  27  --  26  --   --   --  26  --   --  GLUCOSE 124*  --  185*  --  93  --  134*  --   --   --  96  --   --   BUN 92*  --  100*  --  108*  --  96*  --   --   --  70*  --   --   CREATININE 1.07  --  1.15  --  1.35*  --  1.13  --   --   --  0.92  --   --   CALCIUM 8.3*  --  8.1*  --  8.1*  --  7.8*  --   --   --  7.8*  --   --   MG  --   --   --   --   --   --   --   --   --   --  3.5*  --   --   PHOS  --   --   --   --   --   --  4.6  --   --   --  2.9  --   --    < > = values in this interval not displayed.    Liver Function Tests: Recent Labs  Lab 09/22/20 0335 09/23/20 0415 09/24/20 0349 09/25/20 0400 09/25/20 1600 09/26/20 0403  AST 49* 67* 50* 44*  --  49*  ALT 62* 86* 71* 59*  --  39  ALKPHOS 100 140* 137* 113  --  112  BILITOT 1.9* 1.7* 1.9* 1.7*  --  1.7*  PROT 4.7* 4.9* 4.8* 4.5*  --  5.1*  ALBUMIN 2.9* 2.8* 2.8* 2.5* 2.5* 2.7*  2.6*   No results for input(s): LIPASE, AMYLASE in the last 168  hours. Recent Labs  Lab 09/24/20 1128  AMMONIA 36*    CBC: Recent Labs  Lab 09/24/20 2245 09/25/20 0400 09/25/20 0404 09/25/20 1555 09/25/20 1604 09/25/20 1941 09/25/20 2030 09/26/20 0403 09/26/20 0405 09/26/20 0644  WBC 7.4 9.5  --  8.8  --   --  7.6 9.0  --   --   HGB 7.4* 8.3*   < > 8.4*   < > 8.5* 8.0* 8.7* 8.5* 7.8*  HCT 24.3* 26.7*   < > 26.2*   < > 25.0* 25.4* 27.5* 25.0* 23.0*  MCV 98.8 96.0  --  96.3  --   --  96.2 95.5  --   --   PLT 125* 136*  --  137*  --   --  129* 131*  --   --    < > = values in this interval not displayed.    Cardiac Enzymes: No results for input(s): CKTOTAL, CKMB, CKMBINDEX, TROPONINI in the last 168 hours.  BNP: BNP (last 3 results) No results for input(s): BNP in the last 8760 hours.  ProBNP (last 3 results) No results for input(s): PROBNP in the last 8760 hours.    Other results:  Imaging: CT HEAD WO CONTRAST  Result Date: 09/25/2020 CLINICAL DATA:  Subarachnoid hemorrhage follow-up. EXAM: CT HEAD WITHOUT CONTRAST TECHNIQUE: Contiguous axial images were obtained from the base of the skull through the vertex without intravenous contrast. COMPARISON:  CT head December 26 21. FINDINGS: Brain: No substantial change in small volume subarachnoid hemorrhage within the left sylvian fissure. No evidence of new hemorrhage. No evidence of acute large vascular territory infarct. No abnormal mass effect or midline shift. No hydrocephalus. No mass lesion. Vascular: Calcific atherosclerosis. No  hyperdense vessel identified. Skull: No acute fracture. Sinuses/Orbits: Frothy secretions and mucosal thickening the right sphenoid sinus. Other: Bilateral large mastoid effusions. IMPRESSION: 1. No substantial change in small volume subarachnoid hemorrhage within the left sylvian fissure. No evidence of new hemorrhage. 2. Bilateral large mastoid effusions. Electronically Signed   By: Margaretha Sheffield MD   On: 09/25/2020 10:05   CT HEAD WO  CONTRAST  Result Date: 09/24/2020 CLINICAL DATA:  Altered mental status, currently on ECMO EXAM: CT HEAD WITHOUT CONTRAST TECHNIQUE: Contiguous axial images were obtained from the base of the skull through the vertex without intravenous contrast. COMPARISON:  09/07/2020 FINDINGS: Brain: Minimal subarachnoid hemorrhage is noted in the left sylvian fissure. This is new from the prior exam. No other areas of subarachnoid hemorrhage are noted. Vascular: No hyperdense vessel or unexpected calcification. Skull: Normal. Negative for fracture or focal lesion. Sinuses/Orbits: No acute finding. Other: Fluid is noted within the mastoid air cells bilaterally new from the prior exam. IMPRESSION: New subarachnoid hemorrhage on the left in the sylvian fissure. Mastoid air cell effusions bilaterally. Critical Value/emergent results were called by telephone at the time of interpretation on 09/24/2020 at 10:56 am to Warden Fillers, ECMO specialist who will relay findings to Dr. Glori Bickers. Electronically Signed   By: Inez Catalina M.D.   On: 09/24/2020 10:57   CT CHEST W CONTRAST  Result Date: 09/24/2020 CLINICAL DATA:  ECMO patient, acute drop in hemoglobin, hypotension, retroperitoneal hematoma suspected EXAM: CT CHEST, ABDOMEN, AND PELVIS WITH CONTRAST TECHNIQUE: Multidetector CT imaging of the chest, abdomen and pelvis was performed following the standard protocol during bolus administration of intravenous contrast. CONTRAST:  14mL OMNIPAQUE IOHEXOL 300 MG/ML  SOLN COMPARISON:  None. FINDINGS: CT CHEST FINDINGS Cardiovascular: Right internal jugular approach veno venous ECMO cannula, tip positioned in the superior abdominal inferior vena cava. Left upper extremity PICC, tip positioned within the mid SVC. Normal heart size. Left coronary artery calcifications. No pericardial effusion. Mediastinum/Nodes: No enlarged mediastinal, hilar, or axillary lymph nodes. Extensive pneumomediastinum. Tracheostomy. Thyroid and  esophagus demonstrate no significant findings. Lungs/Pleura: Examination of the lungs is significantly limited by breath motion artifact. There is very extensive, dense heterogeneous airspace opacity and consolidation throughout the lungs with small bilateral pleural effusions. No significant pneumothorax. Musculoskeletal: No chest wall mass or suspicious bone lesions identified. There is subpectoral emphysema about the chest wall (series 3, image 17). CT ABDOMEN PELVIS FINDINGS Hepatobiliary: No solid liver abnormality is seen. No gallstones, gallbladder wall thickening, or biliary dilatation. Pancreas: Unremarkable. No pancreatic ductal dilatation or surrounding inflammatory changes. Spleen: Normal in size without significant abnormality. Adrenals/Urinary Tract: Adrenal glands are unremarkable. Kidneys are normal, without renal calculi, solid lesion, or hydronephrosis. Foley catheter within the urinary bladder. Stomach/Bowel: Stomach is within normal limits. Enteric feeding tube is position with tip in the proximal jejunum. Appendix appears normal. No evidence of bowel wall thickening, distention, or inflammatory changes. Descending and sigmoid diverticulosis. Rectal tube. Vascular/Lymphatic: Aortic atherosclerosis. Right femoral venous catheter, tip position in the external iliac vein (series 3, image 107). No enlarged abdominal or pelvic lymph nodes. Reproductive: No mass or other abnormality. Other: No abdominal wall hernia or abnormality. Anasarca. Trace ascites. Musculoskeletal: No acute or significant osseous findings. IMPRESSION: 1. No evidence of retroperitoneal hematoma or other source of hemorrhage in the chest, abdomen, or pelvis. 2. Extensive pneumomediastinum and subpectoral emphysema about the chest wall, likely due to barotrauma. 3. Examination of the lungs is significantly limited by breath motion artifact. There is very extensive,  dense heterogeneous airspace opacity and consolidation throughout  the lungs with small bilateral pleural effusions. Findings are most consistent with ARDS. 4. Support apparatus including tracheostomy and venovenous ECMO as detailed above. 5. Trace ascites and anasarca. 6. Coronary artery disease. Aortic Atherosclerosis (ICD10-I70.0). Electronically Signed   By: Eddie Candle M.D.   On: 09/24/2020 10:53   CT ABDOMEN PELVIS W CONTRAST  Result Date: 09/24/2020 CLINICAL DATA:  ECMO patient, acute drop in hemoglobin, hypotension, retroperitoneal hematoma suspected EXAM: CT CHEST, ABDOMEN, AND PELVIS WITH CONTRAST TECHNIQUE: Multidetector CT imaging of the chest, abdomen and pelvis was performed following the standard protocol during bolus administration of intravenous contrast. CONTRAST:  144mL OMNIPAQUE IOHEXOL 300 MG/ML  SOLN COMPARISON:  None. FINDINGS: CT CHEST FINDINGS Cardiovascular: Right internal jugular approach veno venous ECMO cannula, tip positioned in the superior abdominal inferior vena cava. Left upper extremity PICC, tip positioned within the mid SVC. Normal heart size. Left coronary artery calcifications. No pericardial effusion. Mediastinum/Nodes: No enlarged mediastinal, hilar, or axillary lymph nodes. Extensive pneumomediastinum. Tracheostomy. Thyroid and esophagus demonstrate no significant findings. Lungs/Pleura: Examination of the lungs is significantly limited by breath motion artifact. There is very extensive, dense heterogeneous airspace opacity and consolidation throughout the lungs with small bilateral pleural effusions. No significant pneumothorax. Musculoskeletal: No chest wall mass or suspicious bone lesions identified. There is subpectoral emphysema about the chest wall (series 3, image 17). CT ABDOMEN PELVIS FINDINGS Hepatobiliary: No solid liver abnormality is seen. No gallstones, gallbladder wall thickening, or biliary dilatation. Pancreas: Unremarkable. No pancreatic ductal dilatation or surrounding inflammatory changes. Spleen: Normal in size  without significant abnormality. Adrenals/Urinary Tract: Adrenal glands are unremarkable. Kidneys are normal, without renal calculi, solid lesion, or hydronephrosis. Foley catheter within the urinary bladder. Stomach/Bowel: Stomach is within normal limits. Enteric feeding tube is position with tip in the proximal jejunum. Appendix appears normal. No evidence of bowel wall thickening, distention, or inflammatory changes. Descending and sigmoid diverticulosis. Rectal tube. Vascular/Lymphatic: Aortic atherosclerosis. Right femoral venous catheter, tip position in the external iliac vein (series 3, image 107). No enlarged abdominal or pelvic lymph nodes. Reproductive: No mass or other abnormality. Other: No abdominal wall hernia or abnormality. Anasarca. Trace ascites. Musculoskeletal: No acute or significant osseous findings. IMPRESSION: 1. No evidence of retroperitoneal hematoma or other source of hemorrhage in the chest, abdomen, or pelvis. 2. Extensive pneumomediastinum and subpectoral emphysema about the chest wall, likely due to barotrauma. 3. Examination of the lungs is significantly limited by breath motion artifact. There is very extensive, dense heterogeneous airspace opacity and consolidation throughout the lungs with small bilateral pleural effusions. Findings are most consistent with ARDS. 4. Support apparatus including tracheostomy and venovenous ECMO as detailed above. 5. Trace ascites and anasarca. 6. Coronary artery disease. Aortic Atherosclerosis (ICD10-I70.0). Electronically Signed   By: Eddie Candle M.D.   On: 09/24/2020 10:53   DG CHEST PORT 1 VIEW  Result Date: 09/26/2020 CLINICAL DATA:  ECMO. EXAM: PORTABLE CHEST 1 VIEW COMPARISON:  09/25/2020.  09/24/2020.  CT 09/24/2020. FINDINGS: , Feeding tube, left PICC line, ECMO device in stable position. Heart size stable. Dense bilateral pulmonary infiltrates are again noted. Very slight improvement in aeration noted on today's exam. No pleural  effusion or pneumothorax. IMPRESSION: 1. Lines and tubes in stable position. 2. Dense bilateral pulmonary infiltrates again noted. Very slight improvement in aeration noted on today's exam. Electronically Signed   By: Belle Meade   On: 09/26/2020 06:07   DG CHEST PORT 1 VIEW  Result Date:  09/25/2020 CLINICAL DATA:  Respiratory failure. COVID pneumonia. ECMO. EXAM: PORTABLE CHEST 1 VIEW COMPARISON:  09/24/2020 FINDINGS: The tracheostomy tube is stable. The feeding tube is stable. Left PICC line is stable. ECMO catheter is unchanged. Persistent diffuse airspace process in the lungs. No large pleural effusion or pneumothorax. IMPRESSION: 1. Stable support apparatus. 2. Persistent diffuse airspace process. Electronically Signed   By: Marijo Sanes M.D.   On: 09/25/2020 07:43     Medications:     Scheduled Medications: . amLODipine  10 mg Per Tube QHS  . chlorhexidine      . chlorhexidine gluconate (MEDLINE KIT)  15 mL Mouth Rinse BID  . Chlorhexidine Gluconate Cloth  6 each Topical Daily  . clonazePAM  2 mg Per Tube BID  . cloNIDine  0.2 mg Per Tube TID  . docusate  100 mg Per Tube BID  . feeding supplement (PROSource TF)  45 mL Per Tube TID  . fiber  1 packet Per Tube BID  . free water  100 mL Per Tube Q1H  . insulin aspart  2-6 Units Subcutaneous Q4H  . insulin aspart  3 Units Subcutaneous Q4H  . insulin detemir  13 Units Subcutaneous BID  . levothyroxine  50 mcg Per Tube Q0600  . mouth rinse  15 mL Mouth Rinse 10 times per day  . methylPREDNISolone (SOLU-MEDROL) injection  20 mg Intravenous Q24H  . metoprolol tartrate  25 mg Per Tube BID  . nystatin  5 mL Oral QID  . oxyCODONE  10 mg Per Tube Q6H  . pantoprazole sodium  40 mg Per Tube BID  . pravastatin  40 mg Per Tube q1800  . QUEtiapine  100 mg Per Tube BID  . sodium chloride flush  10-40 mL Intracatheter Q12H    Infusions: .  prismasol BGK 4/2.5 500 mL/hr at 09/25/20 2145  .  prismasol BGK 4/2.5 300 mL/hr at 09/26/20  0500  . sodium chloride 10 mL/hr at 09/20/20 1900  . albumin human    . albumin human 12.5 g (09/25/20 2015)  . bivalirudin (ANGIOMAX) infusion 0.5 mg/mL (Non-ACS indications) 0.04 mg/kg/hr (09/26/20 0700)  . ceFEPime (MAXIPIME) IV Stopped (09/26/20 0543)  . dextrose    . feeding supplement (PIVOT 1.5 CAL) 70 mL/hr at 09/26/20 0400  . fentaNYL infusion INTRAVENOUS 200 mcg/hr (09/26/20 0700)  . niCARDipine Stopped (09/25/20 1957)  . norepinephrine (LEVOPHED) Adult infusion 3 mcg/min (09/26/20 0700)  . prismasol BGK 4/2.5 1,500 mL/hr at 09/26/20 0500  . propofol (DIPRIVAN) infusion 30 mcg/kg/min (09/26/20 0700)  . vancomycin Stopped (09/25/20 2241)    PRN Medications: acetaminophen (TYLENOL) oral liquid 160 mg/5 mL, albumin human, albuterol, diphenhydrAMINE, fentaNYL, Gerhardt's butt cream, haloperidol lactate, heparin, labetalol, lip balm, LORazepam, ondansetron **OR** ondansetron (ZOFRAN) IV, phenylephrine, polyethylene glycol, sodium chloride flush   Assessment/Plan:   1. Acute hypoxic respiratory failure/ARDS in setting of COVID-19 PNA - Cannulated for VV ECMO on 12/8 after failing intubation x 2 days - has complete remedisivir, baricitinib, Solumedrol.  - s/p trach 12/16 - CT chest 12/26 with severe lung disease and pneumomediastinum. Lungs not recovering well.  - Restarted vanc/cefepime 12/26 - Circuit changed 12/13 for elevated LDH and lower flows. PLEX started 12/15. S/p PLEX #4/4 on 12/21. LDH stable. Circuit parameters stable, LDH stable.  - CVVH started 12/27 with intractable volume overload, CXR improving today and sweep down to 8 with good ABG. Continue to ease down sweep. Weight down 4 lbs.  Continue CVVH pulling net UF 100-150 cc/hr today.  -  PTT goal turned down to 40-50 due to [facility]. Improved. Circuit looks ok. PTT 53 today. Discussed dosing with PharmD personally. - Mental status remains a major issue. No purposeful response but moves arms/legs.   2. Bilateral DVT -  remains on bivalirudin - Dosing d/w PharmD - no change  3. DM2 - Insulin adjusted. CBGs improved   4. Obesity -Body mass index is 33.73 kg/m.  5. F/E/N - Stable TFs  6. HTN - Remains very labile  - Now on nicardipine gtt 5 and getting metoprolol 25 bid.  - Improved with propofol for sedation   7. Rectal bleeding - follow closely. Rectal tube removed  8. AKI - Now on CVVH primarily for intractable volume overload, see above.   9. Acute encephalopathy - remains agitated  - head CT ok 09/07/20 and 12/21 - head CT 12/26. Small SAH.  Repeat CT on 12/27 with unchanged small SAH.   10. Small SAH - on CT 12/26 - repeat CT 12/27 unchanged small SAH.  - PTT goal reduced 40-50 - avoid severe HTN as best as possible  Plan discussed on multi-discplinary ECMO rounds with CCM, ECMO coordinator/specialist, PharmDs and RNs.  CRITICAL CARE Performed by: Loralie Champagne  Total critical care time: 45 minutes  Critical care time was exclusive of separately billable procedures and treating other patients.  Critical care was necessary to treat or prevent imminent or life-threatening deterioration.  Critical care was time spent personally by me (independent of midlevel providers or residents) on the following activities: development of treatment plan with patient and/or surrogate as well as nursing, discussions with consultants, evaluation of patient's response to treatment, examination of patient, obtaining history from patient or surrogate, ordering and performing treatments and interventions, ordering and review of laboratory studies, ordering and review of radiographic studies, pulse oximetry and re-evaluation of patient's condition.    Length of Stay: 24   Loralie Champagne MD 09/26/2020, 7:39 AM  Advanced Heart Failure Team Pager [phone] (M-F; [place])  Please contact [facility] for night-coverage after hours (4p -7a ) and weekends on [email or web address]

## 2020-09-26 NOTE — Progress Notes (Signed)
ANTICOAGULATION CONSULT NOTE - Follow Up Consult  Pharmacy Consult for bivalirudin Indication: ECMO and DVTs in setting of [facility]  Labs: Recent Labs    09/24/20 0349 09/24/20 0356 09/24/20 1539 09/24/20 1930 09/25/20 0400 09/25/20 0404 09/25/20 1555 09/25/20 1600 09/25/20 1604 09/25/20 2030 09/26/20 0403 09/26/20 0405  HGB 8.4*   < > 8.6*   < > 8.3*   < > 8.4*  --    < > 8.0* 8.7* 8.5*  HCT 27.6*   < > 28.3*   < > 26.7*   < > 26.2*  --    < > 25.4* 27.5* 25.0*  PLT 151  --  145*   < > 136*  --  137*  --   --  129* 131*  --   APTT 61*   < > 41*   < > 50*  --  50*  --   --   --  53*  --   LABPROT 18.6*  --   --   --  18.1*  --   --   --   --   --  16.3*  --   INR 1.6*  --   --   --  1.6*  --   --   --   --   --  1.4*  --   CREATININE 1.07  --  1.15  --  1.35*  --   --  1.13  --   --   --   --    < > = values in this interval not displayed.    Assessment: 59yo male supratherapeutic on bivalirudin after a few PTTs at upper end of goal; no gtt issues or signs of bleeding per RN.  Goal of Therapy:  aPTT 40-50 seconds   Plan:  Will decrease bivalirudin gtt by 20% to 0.04 mg/kg/hr and check PTT with next scheduled labs.    Vernard Gambles, PharmD, BCPS  09/26/2020,4:51 AM

## 2020-09-26 NOTE — Progress Notes (Signed)
Physical Therapy Treatment Patient Details Name: Donald Rowe MRN: [id number] DOB: September 12, 1961 Today's Date: 09/26/2020    History of Present Illness 59 y.o. male with a pertinent history of hypothyroidism on levothyroxine and hyperlipidemia who was diagnosed with Covid 11/27 and is not vaccinated.  With EMS SaO2 on RA 78%O2. ED patient was increased to 6 L oxygen satting about 92%,. CTA of the Chest was assessed and negative for pulmonary embolism but showed diffuse bilateral infiltrates. He was treated with IV remdesivir, steroids and baricitinib.  Initially, he was also treated with antibiotics but these were stopped on 12/6 with a low procalcitonin. 12/07 intubated, central line, A line, 12/08 ECMO cannulation, cortrak, attempted extubation 12/9 failed reintubated. Trach placed 12/16. CCRT started 12/26.    PT Comments    Pt with increased elbow flexion with coughing and stimulation. Pt exhibited L>R mirror muscle activation with elbow flexion/shoulder flexion on contralateral side. Pt with increase L lateral lean in standing. Stood pt in tilt bed at 35 degrees x 30 minutes with ECMO specialist managing lines. Pt with stable VS throughout.  Goals addressed and extended with lack of progress due to continued sedated state.   Follow Up Recommendations  CIR     Equipment Recommendations   (TBD)       Precautions / Restrictions Precautions Precautions: Other (comment) Precaution Comments: ECMO, cortrak, vent via trach, condom cath, CRRT via femoral catheter Restrictions Weight Bearing Restrictions: No    Mobility  Bed Mobility               General bed mobility comments: Continued: Total A +2 for reposition in bed. Use of kreg tilt bed for semi standing +2 to manage line and safety.  Transfers                 General transfer comment: used tilt bed to raise pt to 35 degrees in standing x 30 min with ECMO specialist monitoring CRRT and ECMO lines        Cognition  Arousal/Alertness: Lethargic Behavior During Therapy: Restless;Flat affect Overall Cognitive Status: Difficult to assess                                 General Comments: not following commands, opened eyes at end of session once back in supine, but did not respond to threat      Exercises General Exercises - Upper Extremity Shoulder Flexion: Left;10 reps;Standing Elbow Flexion: PROM;10 reps;Standing;Both Elbow Extension: 10 reps;Standing;5 reps;Both Wrist Flexion: PROM;Both;Standing;10 reps Wrist Extension: PROM;Both;Standing;10 reps Digit Composite Flexion: PROM;Standing;10 reps;Both Composite Extension: PROM;Both;Standing;10 reps General Exercises - Lower Extremity Ankle Circles/Pumps: PROM;Both;10 reps;Supine Other Exercises Other Exercises: cervical flexion/extension    General Comments General comments (skin integrity, edema, etc.): RN and ECMO specialist present throughout session, BP initially in 200/80s with stimulus, RN adjusted BP meds and with standing BP 150/60s      Pertinent Vitals/Pain Pain Assessment: Faces Faces Pain Scale: Hurts a little bit Pain Location: generalized with limb movement Pain Descriptors / Indicators: Grimacing Pain Intervention(s): Monitored during session           PT Goals (current goals can now be found in the care plan section) Acute Rehab PT Goals Patient Stated Goal: unable PT Goal Formulation: Patient unable to participate in goal setting Time For Goal Achievement: 10/09/20 Potential to Achieve Goals: Poor Progress towards PT goals: Not progressing toward goals - comment (continues to be unable to  wake)    Frequency    Min 3X/week      PT Plan Current plan remains appropriate       AM-PAC PT "6 Clicks" Mobility   Outcome Measure  Help needed turning from your back to your side while in a flat bed without using bedrails?: Total Help needed moving from lying on your back to sitting on the side of a flat  bed without using bedrails?: Total Help needed moving to and from a bed to a chair (including a wheelchair)?: Total Help needed standing up from a chair using your arms (e.g., wheelchair or bedside chair)?: Total Help needed to walk in hospital room?: Total Help needed climbing 3-5 steps with a railing? : Total 6 Click Score: 6    End of Session Equipment Utilized During Treatment: Other (comment) (Kreg bed) Activity Tolerance: Patient tolerated treatment well Patient left: in bed;with nursing/sitter in room;with call bell/phone within reach   PT Visit Diagnosis: Muscle weakness (generalized) (M62.81);Difficulty in walking, not elsewhere classified (R26.2)     Time: 8937-3428 PT Time Calculation (min) (ACUTE ONLY): 48 min  Charges:  $Therapeutic Exercise: 23-37 mins $Therapeutic Activity: 8-22 mins                     Kylyn Sookram B. Beverely Risen PT, DPT Acute Rehabilitation Services Pager [phone] Office [phone]    Elon Alas Fleet 09/26/2020, 2:10 PM

## 2020-09-26 NOTE — Progress Notes (Signed)
[facility] NEPHROLOGY PROGRESS NOTE  Assessment/ Plan: Pt is a 59 y.o. 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  # AKI, nonoliguric: due to ischemic ATN associted with covid-19 infection, IV contrast. Nonoliguric however complicated by hypernatremia and anasarca.  He was treated with free water for hypernatremia causing fluid overload. Azotemia due to steroid, tube feed, hemolysis and the use of diuretics. Right femoral HD catheter placed 09/13/20 by PCCM.  Discussed about possible need for catheter exchange however, Dr. Lynetta Mare wants to use current femoral catheter especially since it is clean and no sign of infection. Started CRRT on 12/27, all 4K bath, UF 100-150 cc/hour as tolerated by BP.  On Angiomax for anticoagulation.  Tolerating well so far.  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  LDH stable.   #Acute hypoxic respiratory failure/ARDS- secondary to covid-19 infection on VV ECMO. S/p trach 09/14/20.  # Hypernatremia: Now managed with CRRT.  Discontinued Lasix.   # Bilateral lower extremity DVT's- on bivalirudin.  # HCAP/MRSA pneumonitis/tracheitis- on IV vancomycin.  # HTN/volume: On nicardipine.  CRRT to manage volume.   Subjective: Seen and examined in ICU.  Tolerating CRRT well.  On ECMO.  Remains intubated and sedated.  No new event.  Discussed with ICU team.  Objective Vital signs in last 24 hours: Vitals:   09/26/20 0700 09/26/20 0800 09/26/20 0815 09/26/20 0830  BP: 104/68 104/69  121/67  Pulse: (!) 101 (!) 129 (!) 129 (!) 120  Resp: 13 (!) 26 (!) 36 19  Temp:  99.3 F (37.4 C)    TempSrc:  Core     SpO2: 99% 98% 97% 100%  Weight:      Height:       Weight change: -6.6 kg  Intake/Output Summary (Last 24 hours) at 09/26/2020 0842 Last data filed at 09/26/2020 0800 Gross per 24 hour  Intake 6421.51 ml  Output 8183 ml  Net -1761.49 ml       Labs: Basic Metabolic Panel: Recent Labs  Lab 09/25/20 1600 09/25/20 1604 09/26/20 0403 09/26/20 0405 09/26/20 0643 09/26/20 0644  NA 146*   < > 142 143 141 143  K 4.7   < > 4.1 4.1 4.2 4.2  CL 113*  --  109  --  108  --   CO2 26  --  26  --  25  --   GLUCOSE 134*  --  96  --  85  --   BUN 96*  --  70*  --  68*  --   CREATININE 1.13  --  0.92  --  0.95  --   CALCIUM 7.8*  --  7.8*  --  7.7*  --   PHOS 4.6  --  2.9  --   --   --    < > = values in this interval not displayed.   Liver Function Tests: Recent Labs  Lab 09/24/20 0349 09/25/20 0400 09/25/20 1600 09/26/20 0403  AST 50* 44*  --  49*  ALT 71* 59*  --  39  ALKPHOS 137* 113  --  112  BILITOT 1.9* 1.7*  --  1.7*  PROT 4.8* 4.5*  --  5.1*  ALBUMIN 2.8* 2.5* 2.5* 2.7*  2.6*  No results for input(s): LIPASE, AMYLASE in the last 168 hours. Recent Labs  Lab 09/24/20 1128  AMMONIA 36*   CBC: Recent Labs  Lab 09/24/20 2245 09/25/20 0400 09/25/20 0404 09/25/20 1555 09/25/20 1604 09/25/20 2030 09/26/20 0403 09/26/20 0405 09/26/20 0644  WBC 7.4 9.5  --  8.8  --  7.6 9.0  --   --   HGB 7.4* 8.3*   < > 8.4*   < > 8.0* 8.7* 8.5* 7.8*  HCT 24.3* 26.7*   < > 26.2*   < > 25.4* 27.5* 25.0* 23.0*  MCV 98.8 96.0  --  96.3  --  96.2 95.5  --   --   PLT 125* 136*  --  137*  --  129* 131*  --   --    < > = values in this interval not displayed.   Cardiac Enzymes: No results for input(s): CKTOTAL, CKMB, CKMBINDEX, TROPONINI in the last 168 hours. CBG: Recent Labs  Lab 09/25/20 1603 09/25/20 1939 09/25/20 2326 09/26/20 0403 09/26/20 0833  GLUCAP 128* 126* 115* 95 110*    Iron Studies: No results for input(s): IRON, TIBC, TRANSFERRIN, FERRITIN in the last  72 hours. Studies/Results: CT HEAD WO CONTRAST  Result Date: 09/25/2020 CLINICAL DATA:  Subarachnoid hemorrhage follow-up. EXAM: CT HEAD WITHOUT CONTRAST TECHNIQUE: Contiguous axial images were obtained from the base of the skull through the vertex without intravenous contrast. COMPARISON:  CT head December 26 21. FINDINGS: Brain: No substantial change in small volume subarachnoid hemorrhage within the left sylvian fissure. No evidence of new hemorrhage. No evidence of acute large vascular territory infarct. No abnormal mass effect or midline shift. No hydrocephalus. No mass lesion. Vascular: Calcific atherosclerosis. No hyperdense vessel identified. Skull: No acute fracture. Sinuses/Orbits: Frothy secretions and mucosal thickening the right sphenoid sinus. Other: Bilateral large mastoid effusions. IMPRESSION: 1. No substantial change in small volume subarachnoid hemorrhage within the left sylvian fissure. No evidence of new hemorrhage. 2. Bilateral large mastoid effusions. Electronically Signed   By: Margaretha Sheffield MD   On: 09/25/2020 10:05   CT HEAD WO CONTRAST  Result Date: 09/24/2020 CLINICAL DATA:  Altered mental status, currently on ECMO EXAM: CT HEAD WITHOUT CONTRAST TECHNIQUE: Contiguous axial images were obtained from the base of the skull through the vertex without intravenous contrast. COMPARISON:  09/07/2020 FINDINGS: Brain: Minimal subarachnoid hemorrhage is noted in the left sylvian fissure. This is new from the prior exam. No other areas of subarachnoid hemorrhage are noted. Vascular: No hyperdense vessel or unexpected calcification. Skull: Normal. Negative for fracture or focal lesion. Sinuses/Orbits: No acute finding. Other: Fluid is noted within the mastoid air cells bilaterally new from the prior exam. IMPRESSION: New subarachnoid hemorrhage on the left in the sylvian fissure. Mastoid air cell effusions bilaterally. Critical Value/emergent results were called by telephone at the time  of interpretation on 09/24/2020 at 10:56 am to Warden Fillers, ECMO specialist who will relay findings to Dr. Glori Bickers. Electronically Signed   By: Inez Catalina M.D.   On: 09/24/2020 10:57   CT CHEST W CONTRAST  Result Date: 09/24/2020 CLINICAL DATA:  ECMO patient, acute drop in hemoglobin, hypotension, retroperitoneal hematoma suspected EXAM: CT CHEST, ABDOMEN, AND PELVIS WITH CONTRAST TECHNIQUE: Multidetector CT imaging of the chest, abdomen and pelvis was performed following the standard protocol during bolus administration of intravenous contrast. CONTRAST:  165m OMNIPAQUE IOHEXOL 300 MG/ML  SOLN COMPARISON:  None. FINDINGS: CT CHEST FINDINGS Cardiovascular: Right internal jugular approach veno venous ECMO cannula,  tip positioned in the superior abdominal inferior vena cava. Left upper extremity PICC, tip positioned within the mid SVC. Normal heart size. Left coronary artery calcifications. No pericardial effusion. Mediastinum/Nodes: No enlarged mediastinal, hilar, or axillary lymph nodes. Extensive pneumomediastinum. Tracheostomy. Thyroid and esophagus demonstrate no significant findings. Lungs/Pleura: Examination of the lungs is significantly limited by breath motion artifact. There is very extensive, dense heterogeneous airspace opacity and consolidation throughout the lungs with small bilateral pleural effusions. No significant pneumothorax. Musculoskeletal: No chest wall mass or suspicious bone lesions identified. There is subpectoral emphysema about the chest wall (series 3, image 17). CT ABDOMEN PELVIS FINDINGS Hepatobiliary: No solid liver abnormality is seen. No gallstones, gallbladder wall thickening, or biliary dilatation. Pancreas: Unremarkable. No pancreatic ductal dilatation or surrounding inflammatory changes. Spleen: Normal in size without significant abnormality. Adrenals/Urinary Tract: Adrenal glands are unremarkable. Kidneys are normal, without renal calculi, solid lesion, or  hydronephrosis. Foley catheter within the urinary bladder. Stomach/Bowel: Stomach is within normal limits. Enteric feeding tube is position with tip in the proximal jejunum. Appendix appears normal. No evidence of bowel wall thickening, distention, or inflammatory changes. Descending and sigmoid diverticulosis. Rectal tube. Vascular/Lymphatic: Aortic atherosclerosis. Right femoral venous catheter, tip position in the external iliac vein (series 3, image 107). No enlarged abdominal or pelvic lymph nodes. Reproductive: No mass or other abnormality. Other: No abdominal wall hernia or abnormality. Anasarca. Trace ascites. Musculoskeletal: No acute or significant osseous findings. IMPRESSION: 1. No evidence of retroperitoneal hematoma or other source of hemorrhage in the chest, abdomen, or pelvis. 2. Extensive pneumomediastinum and subpectoral emphysema about the chest wall, likely due to barotrauma. 3. Examination of the lungs is significantly limited by breath motion artifact. There is very extensive, dense heterogeneous airspace opacity and consolidation throughout the lungs with small bilateral pleural effusions. Findings are most consistent with ARDS. 4. Support apparatus including tracheostomy and venovenous ECMO as detailed above. 5. Trace ascites and anasarca. 6. Coronary artery disease. Aortic Atherosclerosis (ICD10-I70.0). Electronically Signed   By: Eddie Candle M.D.   On: 09/24/2020 10:53   CT ABDOMEN PELVIS W CONTRAST  Result Date: 09/24/2020 CLINICAL DATA:  ECMO patient, acute drop in hemoglobin, hypotension, retroperitoneal hematoma suspected EXAM: CT CHEST, ABDOMEN, AND PELVIS WITH CONTRAST TECHNIQUE: Multidetector CT imaging of the chest, abdomen and pelvis was performed following the standard protocol during bolus administration of intravenous contrast. CONTRAST:  122m OMNIPAQUE IOHEXOL 300 MG/ML  SOLN COMPARISON:  None. FINDINGS: CT CHEST FINDINGS Cardiovascular: Right internal jugular approach  veno venous ECMO cannula, tip positioned in the superior abdominal inferior vena cava. Left upper extremity PICC, tip positioned within the mid SVC. Normal heart size. Left coronary artery calcifications. No pericardial effusion. Mediastinum/Nodes: No enlarged mediastinal, hilar, or axillary lymph nodes. Extensive pneumomediastinum. Tracheostomy. Thyroid and esophagus demonstrate no significant findings. Lungs/Pleura: Examination of the lungs is significantly limited by breath motion artifact. There is very extensive, dense heterogeneous airspace opacity and consolidation throughout the lungs with small bilateral pleural effusions. No significant pneumothorax. Musculoskeletal: No chest wall mass or suspicious bone lesions identified. There is subpectoral emphysema about the chest wall (series 3, image 17). CT ABDOMEN PELVIS FINDINGS Hepatobiliary: No solid liver abnormality is seen. No gallstones, gallbladder wall thickening, or biliary dilatation. Pancreas: Unremarkable. No pancreatic ductal dilatation or surrounding inflammatory changes. Spleen: Normal in size without significant abnormality. Adrenals/Urinary Tract: Adrenal glands are unremarkable. Kidneys are normal, without renal calculi, solid lesion, or hydronephrosis. Foley catheter within the urinary bladder. Stomach/Bowel: Stomach is within normal limits. Enteric feeding  tube is position with tip in the proximal jejunum. Appendix appears normal. No evidence of bowel wall thickening, distention, or inflammatory changes. Descending and sigmoid diverticulosis. Rectal tube. Vascular/Lymphatic: Aortic atherosclerosis. Right femoral venous catheter, tip position in the external iliac vein (series 3, image 107). No enlarged abdominal or pelvic lymph nodes. Reproductive: No mass or other abnormality. Other: No abdominal wall hernia or abnormality. Anasarca. Trace ascites. Musculoskeletal: No acute or significant osseous findings. IMPRESSION: 1. No evidence of  retroperitoneal hematoma or other source of hemorrhage in the chest, abdomen, or pelvis. 2. Extensive pneumomediastinum and subpectoral emphysema about the chest wall, likely due to barotrauma. 3. Examination of the lungs is significantly limited by breath motion artifact. There is very extensive, dense heterogeneous airspace opacity and consolidation throughout the lungs with small bilateral pleural effusions. Findings are most consistent with ARDS. 4. Support apparatus including tracheostomy and venovenous ECMO as detailed above. 5. Trace ascites and anasarca. 6. Coronary artery disease. Aortic Atherosclerosis (ICD10-I70.0). Electronically Signed   By: Eddie Candle M.D.   On: 09/24/2020 10:53   DG CHEST PORT 1 VIEW  Result Date: 09/26/2020 CLINICAL DATA:  ECMO. EXAM: PORTABLE CHEST 1 VIEW COMPARISON:  09/25/2020.  09/24/2020.  CT 09/24/2020. FINDINGS: , Feeding tube, left PICC line, ECMO device in stable position. Heart size stable. Dense bilateral pulmonary infiltrates are again noted. Very slight improvement in aeration noted on today's exam. No pleural effusion or pneumothorax. IMPRESSION: 1. Lines and tubes in stable position. 2. Dense bilateral pulmonary infiltrates again noted. Very slight improvement in aeration noted on today's exam. Electronically Signed   By: Marcello Moores  Register   On: 09/26/2020 06:07   DG CHEST PORT 1 VIEW  Result Date: 09/25/2020 CLINICAL DATA:  Respiratory failure. COVID pneumonia. ECMO. EXAM: PORTABLE CHEST 1 VIEW COMPARISON:  09/24/2020 FINDINGS: The tracheostomy tube is stable. The feeding tube is stable. Left PICC line is stable. ECMO catheter is unchanged. Persistent diffuse airspace process in the lungs. No large pleural effusion or pneumothorax. IMPRESSION: 1. Stable support apparatus. 2. Persistent diffuse airspace process. Electronically Signed   By: Marijo Sanes M.D.   On: 09/25/2020 07:43    Medications: Infusions: .  prismasol BGK 4/2.5 500 mL/hr at 09/25/20  2145  .  prismasol BGK 4/2.5 300 mL/hr at 09/26/20 0500  . sodium chloride 10 mL/hr at 09/20/20 1900  . albumin human    . albumin human 12.5 g (09/25/20 2015)  . bivalirudin (ANGIOMAX) infusion 0.5 mg/mL (Non-ACS indications) 0.04 mg/kg/hr (09/26/20 0800)  . ceFEPime (MAXIPIME) IV Stopped (09/26/20 0543)  . feeding supplement (PIVOT 1.5 CAL) 70 mL/hr at 09/26/20 0800  . fentaNYL infusion INTRAVENOUS 200 mcg/hr (09/26/20 0800)  . niCARDipine 5 mg/hr (09/26/20 0810)  . norepinephrine (LEVOPHED) Adult infusion Stopped (09/26/20 0706)  . prismasol BGK 4/2.5 1,500 mL/hr at 09/26/20 0813  . propofol (DIPRIVAN) infusion 30 mcg/kg/min (09/26/20 0809)  . vancomycin Stopped (09/25/20 2241)    Scheduled Medications: . amLODipine  10 mg Per Tube QHS  . chlorhexidine gluconate (MEDLINE KIT)  15 mL Mouth Rinse BID  . Chlorhexidine Gluconate Cloth  6 each Topical Daily  . clonazePAM  2 mg Per Tube TID  . cloNIDine  0.2 mg Per Tube TID  . docusate  100 mg Per Tube BID  . feeding supplement (PROSource TF)  45 mL Per Tube TID  . fiber  1 packet Per Tube BID  . insulin aspart  2-6 Units Subcutaneous Q4H  . insulin aspart  3 Units Subcutaneous  Q4H  . insulin detemir  13 Units Subcutaneous BID  . levothyroxine  50 mcg Per Tube Q0600  . mouth rinse  15 mL Mouth Rinse 10 times per day  . methylPREDNISolone (SOLU-MEDROL) injection  20 mg Intravenous Q24H  . metoprolol tartrate  25 mg Per Tube BID  . nystatin  5 mL Per Tube QID  . oxyCODONE  10 mg Per Tube Q6H  . pantoprazole sodium  40 mg Per Tube BID  . pravastatin  40 mg Per Tube q1800  . QUEtiapine  200 mg Per Tube BID  . sodium chloride flush  10-40 mL Intracatheter Q12H    have reviewed scheduled and prn medications.  Physical Exam: General: Critically ill looking male lying on bed, has tracheostomy on vent.  Sedated. Heart:RRR, s1s2 nl Lungs: Coarse breath sound bilateral Abdomen:soft, Non-tender, non-distended Extremities: Pitting edema  ++, anasarca+, not much change Dialysis Access: Right femoral HD catheter site clean.  Hokulani Rogel Prasad Kynslie Ringle 09/26/2020,8:42 AM  LOS: 24 days

## 2020-09-26 NOTE — Progress Notes (Signed)
Pharmacy Antibiotic Note  Donald Rowe is a 59 y.o. male admitted on 09/21/2020 with COVID.  Cannulated for ECMO 12/8.  S/p tx vancomycin for MRSA pna last dose 12/20.  New infiltrate LLL, increase WBC11 so empiric vancomycin and cefepime resumed. Pt started on CRRT 12/27 for volume control, still making urine so ABX no adjusted to CRRT doses.  Vancomycin trough 19 mcg/ml and true trough closer to 18 mcg/ml indicating adequate vancomycin clearance on q12h regimen. Will continue current ABX doses.   Plan: -Vancomycin 1050m IV q12h -Cefepime 2g IV q8h -Follow RRT plans, UOP -Would recheck vancomycin trough in a few days to be sure not accumulating  Height: _0  (175.3 cm) Weight: 110.4 kg (243 lb 6.2 oz) IBW/kg (Calculated) : 70.7  Temp (24hrs), Avg:97.8 F (36.6 C), Min:97.3 F (36.3 C), Max:99.3 F (37.4 C)  Recent Labs  Lab 09/24/20 0349 09/24/20 1539 09/24/20 2245 09/25/20 0400 09/25/20 1555 09/25/20 1600 09/25/20 2030 09/26/20 0403 09/26/20 0643 09/26/20 0829  WBC 11.4* 9.4 7.4 9.5 8.8  --  7.6 9.0  --   --   CREATININE 1.07 1.15  --  1.35*  --  1.13  --  0.92 0.95  --   LATICACIDVEN 0.8 1.0  --  0.8 0.8  --   --  0.9  --   --   VANCOTROUGH  --   --   --   --   --   --   --   --   --  19    Estimated Creatinine Clearance: 102.6 mL/min (by C-G formula based on SCr of 0.95 mg/dL).    No Known Allergies  Antimicrobials this admission: Meropenem 12/17 Vancomycin 12/14 >> 12/20 Cefepime 12/7 >> 12/14; 12/17>12.20 Ceftriaxone 12/5>> 12/6 Azithromycin 12/5>12/6  Microbiology results: 12/4 BCx: neg 12/5 MRSA PCR: neg 12/12 Sputum: rare MRSA   12/17 BAL: MRSA 12/26 BCx: NGTD   MArrie Senate PharmD, BCPS, BLayton HospitalClinical Pharmacist [phone] check AMION for all MKind[facility] ShorePharmacy numbers 09/26/2020

## 2020-09-26 NOTE — Progress Notes (Signed)
NAME:  Donald Rowe, MRN:  [id number], DOB:  1961/01/04, LOS: 24 ADMISSION DATE:  09/25/2020, CONSULTATION DATE:  12/6 REFERRING MD:  Dr. Aileen Fass, CHIEF COMPLAINT:  SOB    Brief History   59 y/o M admitted 12/4 with 1 week hx of SOB, known COVID positive.  He is unvaccinated.  CTA chest negative for PE but demonstrated diffuse bilateral infiltrates. [place] started on 12/8   Past Medical History  Hypothyroidism  [facility] Events/Procedures  12/04 Admit  12/06 PCCM consulted  12/07 Intubated, central line, a line 12/08 VV ECMO Cannulaton, cortrak 12/13 circuit change for hemolytic anemia 12/16 Tracheostomy  12/17 bronch w/ BAL  Consults:  PCCM, Heart failure, TCTS, ECMO  Significant Diagnostic Tests:   CTA Chest 12/4 >> extensive bilateral airspace disease, no large PE identified, limited study   LE Venous Duplex 12/4 >> negative for DVT bilaterally   LE venous duplex 12/8> BLE DVTs  RUE venous duplex 12/23  RUE arterial duplex 12/23  CT head 12/26 : Minimal SAH on left side  CT abdomen and pelvis 12/26: Extensive bilateral airspace disease with small pleural effusion  CT head 12/27: Subarachnoid unchanged  Micro Data:  COVID 12/4 >> negative  Influenza A/B 12/4 >> negative  MRSA PCR 12/5 >> negative  BCx2 12/4 >> NG Trach aspirate 12/12> rare MRSA BAL 12/17> MRSA, candida Aspergillus Ag BAL 12/17> 0.05 12/18 sars2: positive  Antimicrobials:  Azithromycin 12/5 >> 12/6  Ceftriaxone 12/5 >> 12/6  Cefepime 12/7>12/14 vanc 12/14>12/20 Meropenem 12/17>12/21  Interim history/subjective:  Had generally done well yesterday with initiation of CRRT.  Increased chatter yesterday evening and periods of agitation with hypertension as he has done before.  Objective   Blood pressure 121/67, pulse (!) 122, temperature 99.3 F (37.4 C), temperature source Core, resp. rate (!) 31, height _0  (1.753 m), weight 110.4 kg, SpO2 97 %.    Vent Mode:  PCV FiO2 (%):  [50 %-60 %] 50 % Set Rate:  [12 bmp] 12 bmp PEEP:  [10 cmH20] 10 cmH20 Plateau Pressure:  [30 cmH20] 30 cmH20   Intake/Output Summary (Last 24 hours) at 09/26/2020 1027 Last data filed at 09/26/2020 0900 Gross per 24 hour  Intake 6443.73 ml  Output 8295 ml  Net -1851.27 ml   Filed Weights   09/25/20 0600 09/25/20 1045 09/26/20 0500  Weight: 119 kg 112.4 kg 110.4 kg    Examination: Gen: Well-developed, acutely ill-appearing obese male, lying on the bed  HEENT: NCAT, perrla, s/p trach Neck: supple, ECMO lines intact and functioning CV: Tachycardic, regular rhythm, no murmur  Pulm: Bilateral crackles, no wheezes or rhonchi, improved air entry Abd: Soft, BS+, NTND Extm: Peripheral pulses intact 2+ radial pulse on RUE, 1+ edema Skin: Dry, Warm,  Neuro: Eyes open but not following commands.  Nonpurposefully moving all 4 extremities.  Pupils 3 mm minimally reactive  Resolved Hospital Problem list     Assessment & Plan:   Acute hypoxemic and hypercarbic respiratory failure secondary to COVID PNA with severe ARDS. S/p VV ECMO cannulation 09/21/2020.  S/p tracheostomy 38/46 complicated by trach site bleeding improved Left-sided subarachnoid hemorrhage Community-acquired pneumonia with MRSA Completed remdesivir.  Completed baricitinib.  c/w full ECMO support Taper Solu-Medrol off Completed for treatment with 4 sessions of Plex CRRT for fluid removal - may help with BP .  Agitated delirium requiring titration of IV sedation.  Periods of severe agitation are limiting ability to wean ECMO Titrate propofol Continue Precedex and fentanyl  infusion Minimize sedation with RASS goal 1 and tolerance of MV & ECMO Increase enteral seroquel, oxycodone and clonazepam  Bilateral lower ext DVTs- provoked by COVID infection Continue bivalirudin,.   Steroid-induced hyperglycemia Fingersticks are better controlled now Decrease Levemir with steroid wean. Continue TF coverage  (but decreasing) and SSI PRN  Hypothyroidism con't PTA synthroid   Labile hypertension, contributed by periods of agitations Patient blood pressure remained labile ranging  Currently on clonidine, hydralazine and metoprolol Started on Cardene infusion  Hemolytic Anemia Throbocytopenia Hyperbilirubinemia Rheumatologic-associated MAHA remains in differential.  hgb and plt are stable Completed treatment with the Plex for 4 sessions Hemoglobin dropped to 7.4, received 2 PRBCs in last 24 hours Transfuse if hgb <8  Hypernatremia and hyperchloremia Azotemia Continue free water 100 cc an hour  Strict I/Os  Best practice (evaluated daily)  Diet: TF Pain/Anxiety/Delirium protocol (if indicated): Precedex and fentanyl VAP protocol (if indicated): yes DVT prophylaxis: Bivalirudin GI prophylaxis: pantoprazole  BID Glucose control: basal-bolus Mobility: As tolerated Last date of multidisciplinary goals of care discussion: wife updated over the phone 12/23 Summary of discussion con't aggressive care Follow up goals of care discussion due: 12/30 Code Status: full Disposition: ICU   Total critical care time:40  minutes   Critical care time was exclusive of separately billable procedures and treating other patients.   Critical care was necessary to treat or prevent imminent or life-threatening deterioration.   Critical care was time spent personally by me on the following activities: development of treatment plan with patient and/or surrogate as well as nursing, discussions with consultants, evaluation of patient's response to treatment, examination of patient, obtaining history from patient or surrogate, ordering and performing treatments and interventions, ordering and review of laboratory studies, ordering and review of radiographic studies, pulse oximetry and re-evaluation of patient's condition.   Kipp Brood, MD [facility] ICU Physician [place]  Pager:  [phone] Or Epic Secure Chat After hours: [phone].  09/26/2020, 10:27 AM

## 2020-09-26 NOTE — Progress Notes (Signed)
ANTICOAGULATION CONSULT NOTE  Pharmacy Consult for bivalirudin Indication: ECMO + bilateral DVTs + SAH 12/26   Recent Labs    09/24/20 0349 09/24/20 0356 09/25/20 0400 09/25/20 0404 09/25/20 1555 09/25/20 1600 09/25/20 2030 09/26/20 0403 09/26/20 0405 09/26/20 0643 09/26/20 0644 09/26/20 0836 09/26/20 1202 09/26/20 1548  HGB 8.4*   < > 8.3*   < > 8.4*   < > 8.0* 8.7*   < >  --  7.8* 8.8* 7.8*  --   HCT 27.6*   < > 26.7*   < > 26.2*   < > 25.4* 27.5*   < >  --  23.0* 26.0* 23.0*  --   PLT 151   < > 136*  --  137*  --  129* 131*  --   --   --   --   --   --   APTT 61*   < > 50*  --  50*  --   --  53*  --   --   --   --   --  46*  LABPROT 18.6*  --  18.1*  --   --   --   --  16.3*  --   --   --   --   --   --   INR 1.6*  --  1.6*  --   --   --   --  1.4*  --   --   --   --   --   --   CREATININE 1.07   < > 1.35*  --   --    < >  --  0.92  --  0.95  --   --   --  0.83   < > = values in this interval not displayed.    Estimated Creatinine Clearance: 117.4 mL/min (by C-G formula based on SCr of 0.83 mg/dL).   Assessment: 35 yom unvaccinated presenting with severe COVID ARDs, intubated on 12/6, continued to have ventilation issues despite NMB and proning - started on VV ECMO on 12/8.  D-dimer >20 > now improved 14, found to have bilateral DVTs.   Has been on bivalirudin drip 0.1mg /kg/hr with aptts at low end of goal 50-60.   S/p Head CT 12/26 with new small SAH.  Held bivalirudin x3hr and resume at lower rate with lower goal 40-50sec s/p follow up  CT 12/27 - bleed  not expanding. CBC stable this am, fibrinogen stable 300-400s, LDH stable 400-500s  aPTT 46sec - (within new low goal)  after bivalirudin drip decreased 0.04mg /kg/hr.   Goal of Therapy:  aPTT 40-50 seconds (new 12/26)  Monitor platelets by anticoagulation protocol: Yes   Plan:  Continue IV bivalirudin to 0.04 mg/kg/hr  Check  q12h aPTT and CBC  Follow up bleeding    Leota Sauers Pharm.D. CPP, BCPS Clinical  Pharmacist [phone] 09/26/2020 5:07 PM     09/26/2020 4:59 PM

## 2020-09-27 ENCOUNTER — Inpatient Hospital Stay (HOSPITAL_COMMUNITY): Payer: HRSA Program

## 2020-09-27 DIAGNOSIS — U071 COVID-19: Secondary | ICD-10-CM | POA: Diagnosis not present

## 2020-09-27 DIAGNOSIS — J9601 Acute respiratory failure with hypoxia: Secondary | ICD-10-CM | POA: Diagnosis not present

## 2020-09-27 DIAGNOSIS — E870 Hyperosmolality and hypernatremia: Secondary | ICD-10-CM | POA: Diagnosis not present

## 2020-09-27 DIAGNOSIS — J80 Acute respiratory distress syndrome: Secondary | ICD-10-CM | POA: Diagnosis not present

## 2020-09-27 DIAGNOSIS — Z7189 Other specified counseling: Secondary | ICD-10-CM | POA: Diagnosis not present

## 2020-09-27 DIAGNOSIS — Z515 Encounter for palliative care: Secondary | ICD-10-CM | POA: Diagnosis not present

## 2020-09-27 LAB — POCT I-STAT 7, (LYTES, BLD GAS, ICA,H+H)
Acid-Base Excess: 0 mmol/L (ref 0.0–2.0)
Acid-Base Excess: 1 mmol/L (ref 0.0–2.0)
Acid-Base Excess: 0 mmol/L (ref 0.0–2.0)
Acid-Base Excess: 0 mmol/L (ref 0.0–2.0)
Acid-Base Excess: 1 mmol/L (ref 0.0–2.0)
Acid-base deficit: 1 mmol/L (ref 0.0–2.0)
Bicarbonate: 26.9 mmol/L (ref 20.0–28.0)
Bicarbonate: 27 mmol/L (ref 20.0–28.0)
Bicarbonate: 26.5 mmol/L (ref 20.0–28.0)
Bicarbonate: 26.2 mmol/L (ref 20.0–28.0)
Bicarbonate: 25.8 mmol/L (ref 20.0–28.0)
Bicarbonate: 26.6 mmol/L (ref 20.0–28.0)
Calcium, Ion: 1.21 mmol/L (ref 1.15–1.40)
Calcium, Ion: 1.18 mmol/L (ref 1.15–1.40)
Calcium, Ion: 1.2 mmol/L (ref 1.15–1.40)
Calcium, Ion: 1.2 mmol/L (ref 1.15–1.40)
Calcium, Ion: 1.17 mmol/L (ref 1.15–1.40)
Calcium, Ion: 1.18 mmol/L (ref 1.15–1.40)
HCT: 26 % — ABNORMAL LOW (ref 39.0–52.0)
HCT: 26 % — ABNORMAL LOW (ref 39.0–52.0)
HCT: 23 % — ABNORMAL LOW (ref 39.0–52.0)
HCT: 23 % — ABNORMAL LOW (ref 39.0–52.0)
HCT: 21 % — ABNORMAL LOW (ref 39.0–52.0)
HCT: 22 % — ABNORMAL LOW (ref 39.0–52.0)
Hemoglobin: 8.8 g/dL — ABNORMAL LOW (ref 13.0–17.0)
Hemoglobin: 8.8 g/dL — ABNORMAL LOW (ref 13.0–17.0)
Hemoglobin: 7.8 g/dL — ABNORMAL LOW (ref 13.0–17.0)
Hemoglobin: 7.8 g/dL — ABNORMAL LOW (ref 13.0–17.0)
Hemoglobin: 7.1 g/dL — ABNORMAL LOW (ref 13.0–17.0)
Hemoglobin: 7.5 g/dL — ABNORMAL LOW (ref 13.0–17.0)
O2 Saturation: 93 %
O2 Saturation: 89 %
O2 Saturation: 93 %
O2 Saturation: 94 %
O2 Saturation: 97 %
O2 Saturation: 95 %
Patient temperature: 37
Patient temperature: 36.5
Patient temperature: 36.3
Patient temperature: 36.4
Patient temperature: 36.1
Patient temperature: 36.6
Potassium: 4.3 mmol/L (ref 3.5–5.1)
Potassium: 4.7 mmol/L (ref 3.5–5.1)
Potassium: 4.4 mmol/L (ref 3.5–5.1)
Potassium: 5 mmol/L (ref 3.5–5.1)
Potassium: 4.9 mmol/L (ref 3.5–5.1)
Potassium: 5 mmol/L (ref 3.5–5.1)
Sodium: 141 mmol/L (ref 135–145)
Sodium: 142 mmol/L (ref 135–145)
Sodium: 140 mmol/L (ref 135–145)
Sodium: 139 mmol/L (ref 135–145)
Sodium: 141 mmol/L (ref 135–145)
Sodium: 141 mmol/L (ref 135–145)
TCO2: 29 mmol/L (ref 22–32)
TCO2: 29 mmol/L (ref 22–32)
TCO2: 28 mmol/L (ref 22–32)
TCO2: 28 mmol/L (ref 22–32)
TCO2: 27 mmol/L (ref 22–32)
TCO2: 28 mmol/L (ref 22–32)
pCO2 arterial: 56.1 mmHg — ABNORMAL HIGH (ref 32.0–48.0)
pCO2 arterial: 50.1 mmHg — ABNORMAL HIGH (ref 32.0–48.0)
pCO2 arterial: 53.2 mmHg — ABNORMAL HIGH (ref 32.0–48.0)
pCO2 arterial: 53.4 mmHg — ABNORMAL HIGH (ref 32.0–48.0)
pCO2 arterial: 47.5 mmHg (ref 32.0–48.0)
pCO2 arterial: 49.2 mmHg — ABNORMAL HIGH (ref 32.0–48.0)
pH, Arterial: 7.288 — ABNORMAL LOW (ref 7.350–7.450)
pH, Arterial: 7.337 — ABNORMAL LOW (ref 7.350–7.450)
pH, Arterial: 7.302 — ABNORMAL LOW (ref 7.350–7.450)
pH, Arterial: 7.297 — ABNORMAL LOW (ref 7.350–7.450)
pH, Arterial: 7.34 — ABNORMAL LOW (ref 7.350–7.450)
pH, Arterial: 7.339 — ABNORMAL LOW (ref 7.350–7.450)
pO2, Arterial: 76 mmHg — ABNORMAL LOW (ref 83.0–108.0)
pO2, Arterial: 60 mmHg — ABNORMAL LOW (ref 83.0–108.0)
pO2, Arterial: 74 mmHg — ABNORMAL LOW (ref 83.0–108.0)
pO2, Arterial: 79 mmHg — ABNORMAL LOW (ref 83.0–108.0)
pO2, Arterial: 89 mmHg (ref 83.0–108.0)
pO2, Arterial: 78 mmHg — ABNORMAL LOW (ref 83.0–108.0)

## 2020-09-27 LAB — CULTURE, RESPIRATORY W GRAM STAIN

## 2020-09-27 LAB — LACTATE DEHYDROGENASE: LDH: 518 U/L — ABNORMAL HIGH (ref 98–192)

## 2020-09-27 LAB — GLUCOSE, CAPILLARY
Glucose-Capillary: 110 mg/dL — ABNORMAL HIGH (ref 70–99)
Glucose-Capillary: 114 mg/dL — ABNORMAL HIGH (ref 70–99)
Glucose-Capillary: 116 mg/dL — ABNORMAL HIGH (ref 70–99)
Glucose-Capillary: 120 mg/dL — ABNORMAL HIGH (ref 70–99)
Glucose-Capillary: 139 mg/dL — ABNORMAL HIGH (ref 70–99)
Glucose-Capillary: 155 mg/dL — ABNORMAL HIGH (ref 70–99)
Glucose-Capillary: 161 mg/dL — ABNORMAL HIGH (ref 70–99)

## 2020-09-27 LAB — CBC
HCT: 27.3 % — ABNORMAL LOW (ref 39.0–52.0)
HCT: 24.6 % — ABNORMAL LOW (ref 39.0–52.0)
Hemoglobin: 9.1 g/dL — ABNORMAL LOW (ref 13.0–17.0)
Hemoglobin: 8 g/dL — ABNORMAL LOW (ref 13.0–17.0)
MCH: 31.8 pg (ref 26.0–34.0)
MCH: 31.3 pg (ref 26.0–34.0)
MCHC: 33.3 g/dL (ref 30.0–36.0)
MCHC: 32.5 g/dL (ref 30.0–36.0)
MCV: 95.5 fL (ref 80.0–100.0)
MCV: 96.1 fL (ref 80.0–100.0)
Platelets: 105 10*3/uL — ABNORMAL LOW (ref 150–400)
Platelets: 98 10*3/uL — ABNORMAL LOW (ref 150–400)
RBC: 2.86 MIL/uL — ABNORMAL LOW (ref 4.22–5.81)
RBC: 2.56 MIL/uL — ABNORMAL LOW (ref 4.22–5.81)
RDW: 17.6 % — ABNORMAL HIGH (ref 11.5–15.5)
RDW: 17.2 % — ABNORMAL HIGH (ref 11.5–15.5)
WBC: 7.9 10*3/uL (ref 4.0–10.5)
WBC: 6 10*3/uL (ref 4.0–10.5)
nRBC: 0.6 % — ABNORMAL HIGH (ref 0.0–0.2)
nRBC: 0.3 % — ABNORMAL HIGH (ref 0.0–0.2)

## 2020-09-27 LAB — PROTIME-INR
INR: 1.3 — ABNORMAL HIGH (ref 0.8–1.2)
Prothrombin Time: 15.7 seconds — ABNORMAL HIGH (ref 11.4–15.2)

## 2020-09-27 LAB — RENAL FUNCTION PANEL
Albumin: 2.8 g/dL — ABNORMAL LOW (ref 3.5–5.0)
Albumin: 2.8 g/dL — ABNORMAL LOW (ref 3.5–5.0)
Anion gap: 7 (ref 5–15)
Anion gap: 8 (ref 5–15)
BUN: 50 mg/dL — ABNORMAL HIGH (ref 6–20)
BUN: 51 mg/dL — ABNORMAL HIGH (ref 6–20)
CO2: 26 mmol/L (ref 22–32)
CO2: 25 mmol/L (ref 22–32)
Calcium: 7.9 mg/dL — ABNORMAL LOW (ref 8.9–10.3)
Calcium: 7.8 mg/dL — ABNORMAL LOW (ref 8.9–10.3)
Chloride: 107 mmol/L (ref 98–111)
Chloride: 107 mmol/L (ref 98–111)
Creatinine, Ser: 0.77 mg/dL (ref 0.61–1.24)
Creatinine, Ser: 0.73 mg/dL (ref 0.61–1.24)
GFR, Estimated: >60 mL/min (ref >60)
GFR, Estimated: >60 mL/min (ref >60)
Glucose, Bld: 111 mg/dL — ABNORMAL HIGH (ref 70–99)
Glucose, Bld: 134 mg/dL — ABNORMAL HIGH (ref 70–99)
Phosphorus: 3.4 mg/dL (ref 2.5–4.6)
Phosphorus: 3.8 mg/dL (ref 2.5–4.6)
Potassium: 4.3 mmol/L (ref 3.5–5.1)
Potassium: 5.2 mmol/L — ABNORMAL HIGH (ref 3.5–5.1)
Sodium: 140 mmol/L (ref 135–145)
Sodium: 140 mmol/L (ref 135–145)

## 2020-09-27 LAB — PREPARE RBC (CROSSMATCH)

## 2020-09-27 LAB — HEPATIC FUNCTION PANEL
ALT: 50 U/L — ABNORMAL HIGH (ref 0–44)
AST: 49 U/L — ABNORMAL HIGH (ref 15–41)
Albumin: 2.8 g/dL — ABNORMAL LOW (ref 3.5–5.0)
Alkaline Phosphatase: 138 U/L — ABNORMAL HIGH (ref 38–126)
Bilirubin, Direct: 0.7 mg/dL — ABNORMAL HIGH (ref 0.0–0.2)
Indirect Bilirubin: 0.7 mg/dL (ref 0.3–0.9)
Total Bilirubin: 1.4 mg/dL — ABNORMAL HIGH (ref 0.3–1.2)
Total Protein: 5.4 g/dL — ABNORMAL LOW (ref 6.5–8.1)

## 2020-09-27 LAB — APTT
aPTT: 47 seconds — ABNORMAL HIGH (ref 24–36)
aPTT: 55 seconds — ABNORMAL HIGH (ref 24–36)

## 2020-09-27 LAB — FIBRINOGEN: Fibrinogen: 444 mg/dL (ref 210–475)

## 2020-09-27 LAB — TRIGLYCERIDES: Triglycerides: 80 mg/dL (ref <150)

## 2020-09-27 LAB — HEMOGLOBIN AND HEMATOCRIT, BLOOD
HCT: 24.6 % — ABNORMAL LOW (ref 39.0–52.0)
Hemoglobin: 7.8 g/dL — ABNORMAL LOW (ref 13.0–17.0)

## 2020-09-27 LAB — LACTIC ACID, PLASMA
Lactic Acid, Venous: 0.9 mmol/L (ref 0.5–1.9)
Lactic Acid, Venous: 1.2 mmol/L (ref 0.5–1.9)

## 2020-09-27 LAB — MAGNESIUM: Magnesium: 3.1 mg/dL — ABNORMAL HIGH (ref 1.7–2.4)

## 2020-09-27 MED ORDER — QUETIAPINE FUMARATE 50 MG PO TABS
150.0000 mg | ORAL_TABLET | Freq: Three times a day (TID) | ORAL | Status: DC
  Administered 2020-09-27 – 2020-10-12 (×45): 150 mg
  Filled 2020-09-27 (×45): qty 1

## 2020-09-27 MED ORDER — SODIUM CHLORIDE 0.9% IV SOLUTION: Freq: Once | INTRAVENOUS | Status: AC

## 2020-09-27 MED ORDER — POLYETHYLENE GLYCOL 3350 17 G PO PACK
17.0000 g | PACK | Freq: Two times a day (BID) | ORAL | Status: DC
  Administered 2020-09-27 – 2020-10-12 (×18): 17 g
  Filled 2020-09-27 (×19): qty 1

## 2020-09-27 MED ORDER — SORBITOL 70 % SOLN
30.0000 mL | Freq: Once | Status: AC
  Administered 2020-09-27: 08:00:00 30 mL
  Filled 2020-09-27: qty 30

## 2020-09-27 NOTE — Progress Notes (Signed)
NAME:  Donald Rowe, MRN:  [id number], DOB:  1960/10/11, LOS: 60 ADMISSION DATE:  09/27/2020, CONSULTATION DATE:  12/6 REFERRING MD:  Dr. Aileen Fass, CHIEF COMPLAINT:  SOB    Brief History   59 y/o M admitted 12/4 with 1 week hx of SOB, known COVID positive.  He is unvaccinated.  CTA chest negative for PE but demonstrated diffuse bilateral infiltrates. [place] started on 12/8   Past Medical History  Hypothyroidism  [facility] Events/Procedures  12/04 Admit  12/06 PCCM consulted  12/07 Intubated, central line, a line 12/08 VV ECMO Cannulaton, cortrak 12/13 circuit change for hemolytic anemia 12/16 Tracheostomy  12/17 bronch w/ BAL  Consults:  PCCM, Heart failure, TCTS, ECMO  Significant Diagnostic Tests:   CTA Chest 12/4 >> extensive bilateral airspace disease, no large PE identified, limited study   LE Venous Duplex 12/4 >> negative for DVT bilaterally   LE venous duplex 12/8> BLE DVTs  RUE venous duplex 12/23  RUE arterial duplex 12/23  CT head 12/26 : Minimal SAH on left side  CT abdomen and pelvis 12/26: Extensive bilateral airspace disease with small pleural effusion  CT head 12/27: Subarachnoid unchanged  Micro Data:  COVID 12/4 >> negative  Influenza A/B 12/4 >> negative  MRSA PCR 12/5 >> negative  BCx2 12/4 >> NG Trach aspirate 12/12> rare MRSA BAL 12/17> MRSA, candida Aspergillus Ag BAL 12/17> 0.05 12/18 sars2: positive  Antimicrobials:  Azithromycin 12/5 >> 12/6  Ceftriaxone 12/5 >> 12/6  Cefepime 12/7>12/14 vanc 12/14>12/20 Meropenem 12/17>12/21  Interim history/subjective:  Continues to have swings in blood pressure with agitation.  Chatter with increased Pvenous requiring decreasing UF rate and administration of albumin.  Overall achieving net negative fluid balance over past 24 hours.  Objective   Blood pressure 133/65, pulse (!) 127, temperature 98.8 F (37.1 C), temperature source Core, resp. rate (!) 30, height _0   (1.753 m), weight 107.1 kg, SpO2 95 %.    Vent Mode: PCV FiO2 (%):  [50 %] 50 % Set Rate:  [12 bmp] 12 bmp PEEP:  [10 cmH20] 10 cmH20 Pressure Support:  [10 cmH20] 10 cmH20 Plateau Pressure:  [24 cmH20-29 cmH20] 24 cmH20   Intake/Output Summary (Last 24 hours) at 09/27/2020 1005 Last data filed at 09/27/2020 0900 Gross per 24 hour  Intake 4765.47 ml  Output 6866 ml  Net -2100.53 ml   Filed Weights   09/25/20 1045 09/26/20 0500 09/27/20 0444  Weight: 112.4 kg 110.4 kg 107.1 kg    Examination: Gen: Well-developed, acutely ill-appearing obese male, lying on the bed  HEENT: NCAT, perrla, s/p trach Neck: supple, ECMO lines intact and functioning CV: Tachycardic, regular rhythm, no murmur  Pulm: Bilateral crackles, no wheezes or rhonchi, improved air entry Abd: Soft, BS+, NTND Extm: Peripheral pulses intact 2+ radial pulse on RUE, 1+ edema Skin: Dry, Warm,  Neuro: Eyes open but not following commands.  Nonpurposefully moving all 4 extremities.  Pupils 3 mm minimally reactive  Resolved Hospital Problem list     Assessment & Plan:   Acute hypoxemic and hypercarbic respiratory failure secondary to COVID PNA with severe ARDS. S/p VV ECMO cannulation 09/23/2020.  S/p tracheostomy 90/93 complicated by trach site bleeding improved Left-sided subarachnoid hemorrhage Community-acquired pneumonia with MRSA Completed remdesivir.  Completed baricitinib.  c/w full ECMO support Taper Solu-Medrol off Completed for treatment with 4 sessions of Plex CRRT for fluid removal - may help with BP  . Agitated delirium requiring titration of IV sedation.  Periods  of severe agitation are limiting ability to wean ECMO Titrate propofol Continue and fentanyl infusion Minimize sedation with RASS goal 1 and tolerance of MV & ECMO Increase enteral seroquel, oxycodone and clonazepam  Bilateral lower ext DVTs- provoked by COVID infection Continue bivalirudin,.   Steroid-induced  hyperglycemia Fingersticks are better controlled now Decrease Levemir with steroid wean. Continue TF coverage (but decreasing) and SSI PRN  Hypothyroidism con't PTA synthroid   Labile hypertension, contributed by periods of agitations Patient blood pressure remained labile ranging  Currently on clonidine, hydralazine and metoprolol Titrate Cardene as necessary  Hemolytic Anemia Throbocytopenia Hyperbilirubinemia Rheumatologic-associated MAHA remains in differential.  hgb and plt are stable Completed treatment with the Plex for 4 sessions Hemoglobin dropped to 7.4, received 2 PRBCs in last 24 hours Transfuse if hgb <8    Best practice (evaluated daily)  Diet: TF Pain/Anxiety/Delirium protocol (if indicated): Precedex and fentanyl VAP protocol (if indicated): yes DVT prophylaxis: Bivalirudin GI prophylaxis: pantoprazole  BID Glucose control: basal-bolus Mobility: As tolerated Last date of multidisciplinary goals of care discussion: wife updated over the phone 12/23 Summary of discussion con't aggressive care Follow up goals of care discussion due: 12/30 Code Status: full Disposition: ICU   Total critical care time:40  minutes   Critical care time was exclusive of separately billable procedures and treating other patients.   Critical care was necessary to treat or prevent imminent or life-threatening deterioration.   Critical care was time spent personally by me on the following activities: development of treatment plan with patient and/or surrogate as well as nursing, discussions with consultants, evaluation of patient's response to treatment, examination of patient, obtaining history from patient or surrogate, ordering and performing treatments and interventions, ordering and review of laboratory studies, ordering and review of radiographic studies, pulse oximetry and re-evaluation of patient's condition.   Kipp Brood, MD [facility] ICU Physician [place]  Pager: [phone] Or Epic Secure Chat After hours: [phone].  09/27/2020, 10:05 AM

## 2020-09-27 NOTE — Procedures (Signed)
Extracorporeal support note     ECLS cannulation: 27-Sep-2020 Last circuit change: 09/11/2020  Indication: COVD ARDS   Configuration: VV, RIJ 32Fr [place]   Pump speed: 3351 Pump flow: 4.11 L Pump used: Cardiohelp   Sweep gas: 100%, 8.5 LPM   Circuit check:  No clot noted Anticoagulant: bivalirudin   Changes in support: continue current support.  Wean sweep as tolerated   anticipated goals/duration of support: bridge to recovery, day 21 of support.   Lynnell Catalan, MD [facility] ICU Physician [facility] Critical [facility]  Pager: [phone] Or Epic Secure Chat After hours: [phone].  09/27/2020, 9:56 AM

## 2020-09-27 NOTE — Progress Notes (Signed)
Palliative:  HPI:59 y.o.malewith a pertinent history ofhypothyroidism on levothyroxine and hyperlipidemia who was diagnosed with Covid 1 week ago and is not vaccinated. With EMS he was seen to bedesaturatingto 78% on room air. He states he started having symptoms on Friday and tested positive on Saturday.Had initially elected to be DNAR without intubation though later changed this and got intubated on 12/6 in the setting of worsening respiratory failure with hypoxia. CTA chest w/diffuse bilateral infiltrates.On 12/8 was cannulated for VV ECMO.Had been extubated on 12/9 then emergently reintubated in early eveningdue to clinical instability. Palliative care was asked to get involved in the setting of VV ECMO to set goals and expectations withJ.R.'sfamily.He continues on VV ECMO and ventilator support.ECMO circuit change 09/11/20.PLEX began 12/15.  [name] continues with much of the same. No significant changes or plans today. Discussed with Dr. Denese Killings wife, [name]'s concerns, with polypharmacy and sedating medications and he fully understands and agrees. However, there is very little that can be done at this stage as he continues to have periods of agitation which are dangerous for him with movement that puts him at risk to pull a line out or hypertension which can lead to complications as well. Still trying to get him moved to better pill regimen with hopes of continuing to titrate down IV infusions. Still considering poor neurological status as severe delirium.   I discussed the above with [name]. [name] understands that we are in a difficult position with medication and sedation and does wish that there was more that could be done. We discussed plan to transition to pill medication but we know that this will take time to work and would not expect drastic changes or progress overnight or even just a few days at this stage. After having some setbacks over the weekend [name] is just happy that there  have not been further setbacks and tries to accept this as a positive.   [name] also asks about the benefit of SSRI specifically fluoxetine. We did discuss that this medication usually takes weeks or months to begin to take effect and this would likely be of limited benefit at this stage when he needs more immediate relief. Will leave up to medical team (PCCM/heart failure team) to decide if this would be beneficial. I do worry about adding more medication and contributing to worsening polypharmacy as well.   All questions/concerns addressed. Emotional support provided.   Exam: Sedated on ventvia trach. VV ECMO. Cortrak in place.No distress.50% FiO2 and tolerating ventwith no dyssynchrony. Extremities warm to touch. Toes cool.Abdsoft.  Plan: - Continue aggressive care with hopes of improvement.   45 min  Yong Channel, NP Palliative Medicine Team Pager [phone] (Please see [email or web address] for schedule) Team Phone [phone]    Greater than 50%  of this time was spent counseling and coordinating care related to the above assessment and plan

## 2020-09-27 NOTE — Progress Notes (Signed)
ANTICOAGULATION CONSULT NOTE  Pharmacy Consult for bivalirudin Indication: ECMO + bilateral DVTs + [facility] 12/26   Recent Labs    09/25/20 0400 09/25/20 0404 09/26/20 0403 09/26/20 0405 09/26/20 7893 09/26/20 0644 09/26/20 1548 09/26/20 1557 09/26/20 2257 09/27/20 0339 09/27/20 0351  HGB 8.3*   < > 8.7*   < >  --    < > 8.3*   < > 7.5* 9.1* 8.8*  HCT 26.7*   < > 27.5*   < >  --    < > 25.7*   < > 23.8* 27.3* 26.0*  PLT 136*   < > 131*  --   --   --  108*  --   --  105*  --   APTT 50*   < > 53*  --   --   --  46*  --   --  47*  --   LABPROT 18.1*  --  16.3*  --   --   --   --   --   --  15.7*  --   INR 1.6*  --  1.4*  --   --   --   --   --   --  1.3*  --   CREATININE 1.35*   < > 0.92  --  0.95  --  0.83  --   --  0.77  --    < > = values in this interval not displayed.    Estimated Creatinine Clearance: 120 mL/min (by C-G formula based on SCr of 0.77 mg/dL).   Assessment: 30 yom unvaccinated presenting with severe COVID ARDs, intubated on 12/6, continued to have ventilation issues despite NMB and proning - started on VV ECMO on 12/8. Pt noted to have bilateral DVTs 12/8. Bivalirudin started for ECMO anticoagulation. Head CT 12/26 with new small SAH, repeat 12/27 stable without expansion - bivalirudin aPTT goal lowered.  Morning aPTT is therapeutic at 47 seconds, pRBCs given for low H/H overnight now up to 9.1. Fibrinogen and INR wnl, LDH up slightly but stable.   Goal of Therapy:  aPTT 40-50 seconds (new 12/26)  Monitor platelets by anticoagulation protocol: Yes   Plan:  Continue bivalirudin 0.04 mg/kg/hr  (weight 103kg) Check q12h aPTT and CBC  Monitor for S/Sx bleeding   Fredonia Highland, PharmD, BCPS, [facility] Clinical Pharmacist [phone] Please check AMION for all [facility] Pharmacy numbers 09/27/2020

## 2020-09-27 NOTE — Progress Notes (Signed)
Patient ID: Donald Rowe, male   DOB: 1961/07/08, 59 y.o.   MRN: [id number]    Advanced Heart Failure Rounding Note   Subjective:    12/8 Cannulated for VV ECMO - 32 FR  [facility] 12/9 Extubated/Reintubated for altered mental status 12/13 Circuit change for elevated LDH 12/15 PLEX started 12/16 Tracheostomy 12/23 head CT (normal) for change in mental status.  12/26 Pan CT for drop in hgb. Small SAH. Severe lung disease 12/27 CVVH begun  He received 1 more unit PRBCs overnight without overt bleeding evidence, hgb 9.1 today.   Still no purposeful responses but gets agitated and moves arms.  He is currently sedated on propofol and Fentanyl.   MAP high today but off nicardipine currently.  Getting clonidine, amlodipine, and metoprolol.  CVVH ongoing, pulling 80-90 cc/hr net negative currently with weight down 7 lbs.  Some chugging in circuit overnight.  ABG mildly acidotic, increased sweep back to 8.5.  CXR unchanged today.  Had PLEX 4/4 completed 12/22.   ECMO   Speed 3375 Flow 4.07 L Sweep 8.5 dP 20 pVen -66  ABG: 7.29/56/76/93% Hgb 9.1 LDH 434 -> 544 -> 419 -> 551 -> 579 -> 590 -> 438 -> 518 Lactic acid 0.9 PTT 47  Vent 50%   Objective:   Weight Range:  Vital Signs:   Temp:  [97.7 F (36.5 C)-99.3 F (37.4 C)] 98.7 F (37.1 C) (12/29 0400) Pulse Rate:  [97-130] 109 (12/29 0700) Resp:  [7-36] 13 (12/29 0700) BP: (74-159)/(36-81) 126/69 (12/29 0700) SpO2:  [94 %-100 %] 99 % (12/29 0700) Arterial Line BP: (93-185)/(41-66) 158/52 (12/29 0700) FiO2 (%):  [50 %] 50 % (12/29 0414) Weight:  [107.1 kg] 107.1 kg (12/29 0444) Last BM Date: 09/23/20  Weight change: Filed Weights   09/25/20 1045 09/26/20 0500 09/27/20 0444  Weight: 112.4 kg 110.4 kg 107.1 kg    Intake/Output:   Intake/Output Summary (Last 24 hours) at 09/27/2020 0733 Last data filed at 09/27/2020 0700 Gross per 24 hour  Intake 4713.53 ml  Output 6973 ml  Net -2259.47 ml     Physical  Exam: General: Sedated on vent Neck: No JVD, no thyromegaly or thyroid nodule.  Lungs: Decreased at bases.  CV: Nondisplaced PMI.  Heart mildly tachy regular S1/S2, no S3/S4, no murmur.  No peripheral edema.   Abdomen: Soft, nontender, no hepatosplenomegaly, no distention.  Skin: Intact without lesions or rashes.  Neurologic: Sedated on vent currently, per nursing will move arms but not purposefully.  Extremities: No clubbing or cyanosis.  HEENT: Normal.     Telemetry: sinus 110s Personally reviewed    Labs: Basic Metabolic Panel: Recent Labs  Lab 09/25/20 1600 09/25/20 1604 09/26/20 0403 09/26/20 0405 09/26/20 0643 09/26/20 0644 09/26/20 1548 09/26/20 1557 09/26/20 1709 09/26/20 1811 09/26/20 2002 09/27/20 0339 09/27/20 0351  NA 146*   < > 142   < > 141   < > 140   < > 140 141 141 140 141  K 4.7   < > 4.1   < > 4.2   < > 5.0   < > 5.0 5.2* 5.0 4.3 4.3  CL 113*  --  109  --  108  --  108  --   --   --   --  107  --   CO2 26  --  26  --  25  --  25  --   --   --   --  26  --  GLUCOSE 134*  --  96  --  85  --  151*  --   --   --   --  111*  --   BUN 96*  --  70*  --  68*  --  57*  --   --   --   --  50*  --   CREATININE 1.13  --  0.92  --  0.95  --  0.83  --   --   --   --  0.77  --   CALCIUM 7.8*  --  7.8*  --  7.7*  --  7.8*  --   --   --   --  7.9*  --   MG  --   --  3.5*  --   --   --   --   --   --   --   --  3.1*  --   PHOS 4.6  --  2.9  --   --   --  4.2  --   --   --   --  3.4  --    < > = values in this interval not displayed.    Liver Function Tests: Recent Labs  Lab 09/23/20 0415 09/24/20 0349 09/25/20 0400 09/25/20 1600 09/26/20 0403 09/26/20 1548 09/27/20 0339  AST 67* 50* 44*  --  49*  --  49*  ALT 86* 71* 59*  --  39  --  50*  ALKPHOS 140* 137* 113  --  112  --  138*  BILITOT 1.7* 1.9* 1.7*  --  1.7*  --  1.4*  PROT 4.9* 4.8* 4.5*  --  5.1*  --  5.4*  ALBUMIN 2.8* 2.8* 2.5* 2.5* 2.7*  2.6* 2.8* 2.8*  2.8*   No results for input(s):  LIPASE, AMYLASE in the last 168 hours. Recent Labs  Lab 09/24/20 1128  AMMONIA 36*    CBC: Recent Labs  Lab 09/25/20 1555 09/25/20 1604 09/25/20 2030 09/26/20 0403 09/26/20 0405 09/26/20 1548 09/26/20 1557 09/26/20 1811 09/26/20 2002 09/26/20 2257 09/27/20 0339 09/27/20 0351  WBC 8.8  --  7.6 9.0  --  7.7  --   --   --   --  7.9  --   HGB 8.4*   < > 8.0* 8.7*   < > 8.3*   < > 8.8* 8.8* 7.5* 9.1* 8.8*  HCT 26.2*   < > 25.4* 27.5*   < > 25.7*   < > 26.0* 26.0* 23.8* 27.3* 26.0*  MCV 96.3  --  96.2 95.5  --  95.5  --   --   --   --  95.5  --   PLT 137*  --  129* 131*  --  108*  --   --   --   --  105*  --    < > = values in this interval not displayed.    Cardiac Enzymes: No results for input(s): CKTOTAL, CKMB, CKMBINDEX, TROPONINI in the last 168 hours.  BNP: BNP (last 3 results) No results for input(s): BNP in the last 8760 hours.  ProBNP (last 3 results) No results for input(s): PROBNP in the last 8760 hours.    Other results:  Imaging: CT HEAD WO CONTRAST  Result Date: 09/25/2020 CLINICAL DATA:  Subarachnoid hemorrhage follow-up. EXAM: CT HEAD WITHOUT CONTRAST TECHNIQUE: Contiguous axial images were obtained from the base of the skull through the vertex without intravenous contrast. COMPARISON:  CT head December  26 21. FINDINGS: Brain: No substantial change in small volume subarachnoid hemorrhage within the left sylvian fissure. No evidence of new hemorrhage. No evidence of acute large vascular territory infarct. No abnormal mass effect or midline shift. No hydrocephalus. No mass lesion. Vascular: Calcific atherosclerosis. No hyperdense vessel identified. Skull: No acute fracture. Sinuses/Orbits: Frothy secretions and mucosal thickening the right sphenoid sinus. Other: Bilateral large mastoid effusions. IMPRESSION: 1. No substantial change in small volume subarachnoid hemorrhage within the left sylvian fissure. No evidence of new hemorrhage. 2. Bilateral large mastoid  effusions. Electronically Signed   By: Margaretha Sheffield MD   On: 09/25/2020 10:05   DG CHEST PORT 1 VIEW  Result Date: 09/26/2020 CLINICAL DATA:  ECMO. EXAM: PORTABLE CHEST 1 VIEW COMPARISON:  09/25/2020.  09/24/2020.  CT 09/24/2020. FINDINGS: , Feeding tube, left PICC line, ECMO device in stable position. Heart size stable. Dense bilateral pulmonary infiltrates are again noted. Very slight improvement in aeration noted on today's exam. No pleural effusion or pneumothorax. IMPRESSION: 1. Lines and tubes in stable position. 2. Dense bilateral pulmonary infiltrates again noted. Very slight improvement in aeration noted on today's exam. Electronically Signed   By: Marcello Moores  Register   On: 09/26/2020 06:07     Medications:     Scheduled Medications: . amLODipine  10 mg Per Tube QHS  . B-complex with vitamin C  1 tablet Per Tube Daily  . chlorhexidine gluconate (MEDLINE KIT)  15 mL Mouth Rinse BID  . Chlorhexidine Gluconate Cloth  6 each Topical Daily  . clonazePAM  2 mg Per Tube Q8H  . cloNIDine  0.2 mg Per Tube TID  . docusate  100 mg Per Tube BID  . feeding supplement (PROSource TF)  45 mL Per Tube QID  . fiber  1 packet Per Tube BID  . insulin aspart  2-6 Units Subcutaneous Q4H  . insulin aspart  3 Units Subcutaneous Q4H  . insulin detemir  13 Units Subcutaneous BID  . levothyroxine  50 mcg Per Tube Q0600  . mouth rinse  15 mL Mouth Rinse 10 times per day  . methylPREDNISolone (SOLU-MEDROL) injection  20 mg Intravenous Q24H  . metoprolol tartrate  25 mg Per Tube Q12H  . nystatin  5 mL Per Tube QID  . oxyCODONE  10 mg Per Tube Q6H  . pantoprazole sodium  40 mg Per Tube BID  . polyethylene glycol  17 g Per Tube BID  . pravastatin  40 mg Per Tube q1800  . QUEtiapine  150 mg Per Tube TID  . sodium chloride flush  10-40 mL Intracatheter Q12H  . sorbitol  30 mL Per Tube Once    Infusions: .  prismasol BGK 4/2.5 500 mL/hr at 09/27/20 0600  .  prismasol BGK 4/2.5 300 mL/hr at 09/27/20  0600  . sodium chloride 10 mL/hr at 09/20/20 1900  . albumin human    . albumin human 12.5 g (09/27/20 0636)  . bivalirudin (ANGIOMAX) infusion 0.5 mg/mL (Non-ACS indications) 0.04 mg/kg/hr (09/27/20 5573)  . ceFEPime (MAXIPIME) IV Stopped (09/27/20 2202)  . feeding supplement (PIVOT 1.5 CAL) 1,000 mL (09/27/20 0600)  . fentaNYL infusion INTRAVENOUS 250 mcg/hr (09/27/20 5427)  . niCARDipine Stopped (09/27/20 0630)  . norepinephrine (LEVOPHED) Adult infusion Stopped (09/26/20 2335)  . prismasol BGK 4/2.5 1,500 mL/hr at 09/27/20 0051  . propofol (DIPRIVAN) infusion Stopped (09/27/20 0630)  . vancomycin Stopped (09/26/20 2220)    PRN Medications: acetaminophen (TYLENOL) oral liquid 160 mg/5 mL, albumin human, albuterol, diphenhydrAMINE, fentaNYL,  Gerhardt's butt cream, haloperidol lactate, heparin, labetalol, lip balm, LORazepam, ondansetron **OR** ondansetron (ZOFRAN) IV, phenylephrine, sodium chloride flush   Assessment/Plan:   1. Acute hypoxic respiratory failure/ARDS in setting of COVID-19 PNA - Cannulated for VV ECMO on 12/8 after failing intubation x 2 days - has complete remedisivir, baricitinib, Solumedrol.  - s/p trach 12/16 - CT chest 12/26 with severe lung disease and pneumomediastinum. Lungs not recovering well.  - Restarted vanc/cefepime 12/26 - Circuit changed 12/13 for elevated LDH and lower flows. PLEX started 12/15. S/p PLEX #4/4 on 12/21. LDH stable. Circuit parameters stable.   - Had to go back up on sweep to 8.5 based on this am's ABG.  - CVVH started 12/27 with intractable volume overload, CXR stable today. Weight down another 7 lbs. Have had trouble with chugging in circuit, will plan to back off some on UF today, pull net negative 50 cc/hr today.  - PTT goal turned down to 40-50 due to [facility]. Improved. Circuit looks ok. PTT 47 today. Discussed dosing with PharmD personally. - Mental status remains a major issue. No purposeful response but moves arms.    2. Bilateral  DVT - remains on bivalirudin - Dosing d/w PharmD - no change  3. DM2 - Insulin adjusted. CBGs improved   4. Obesity -Body mass index is 33.73 kg/m.  5. F/E/N - Stable TFs  6. HTN - Remains very labile  - Now off nicardipine and getting amlodipine, clonidine, and metoprolol.  - Improved with propofol for sedation   7. Rectal bleeding - follow closely. Rectal tube removed  8. AKI - Now on CVVH primarily for intractable volume overload, see above.   9. Acute encephalopathy - remains agitated  - head CT ok 09/07/20 and 12/21 - head CT 12/26. Small SAH.  Repeat CT on 12/27 with unchanged small SAH.   10. Small SAH - on CT 12/26 - repeat CT 12/27 unchanged small SAH.  - PTT goal reduced 40-50 - avoid severe HTN as best as possible  Plan discussed on multi-discplinary ECMO rounds with CCM, ECMO coordinator/specialist, PharmDs and RNs.  CRITICAL CARE Performed by: Loralie Champagne  Total critical care time: 45 minutes  Critical care time was exclusive of separately billable procedures and treating other patients.  Critical care was necessary to treat or prevent imminent or life-threatening deterioration.  Critical care was time spent personally by me (independent of midlevel providers or residents) on the following activities: development of treatment plan with patient and/or surrogate as well as nursing, discussions with consultants, evaluation of patient's response to treatment, examination of patient, obtaining history from patient or surrogate, ordering and performing treatments and interventions, ordering and review of laboratory studies, ordering and review of radiographic studies, pulse oximetry and re-evaluation of patient's condition.    Length of Stay: 25   Loralie Champagne MD 09/27/2020, 7:33 AM  Advanced Heart Failure Team Pager [phone] (M-F; 7a - 4p)  Please contact [facility] for night-coverage after hours (4p -7a ) and weekends on [email or web address]

## 2020-09-27 NOTE — Progress Notes (Signed)
[facility] NEPHROLOGY PROGRESS NOTE  Assessment/ Plan: Pt is a 59 y.o. 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  # AKI, nonoliguric: due to ischemic ATN associted with covid-19 infection, IV contrast. Nonoliguric however complicated by hypernatremia and anasarca.  He was treated with free water for hypernatremia causing fluid overload. Azotemia due to steroid, tube feed, hemolysis and the use of diuretics. Right femoral HD catheter placed 09/13/20 by PCCM.  Discussed about possible need for catheter exchange however, Dr. Lynetta Mare wants to use current femoral catheter especially since it is clean and no sign of infection. Started CRRT on 12/27, all 4K bath, UF 50-100 cc/hour as tolerated by BP.  On Angiomax for anticoagulation.  Tolerating well so far.  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  LDH stable.   #Acute hypoxic respiratory failure/ARDS- secondary to covid-19 infection on VV ECMO. S/p trach 09/14/20.  # Hypernatremia: Now managed with CRRT.  Discontinued Lasix.   # Bilateral lower extremity DVT's- on bivalirudin.  # HCAP/MRSA pneumonitis/tracheitis- on IV vancomycin.  # HTN/volume: On nicardipine.  CRRT to manage volume.   Subjective: Seen and examined in ICU.  No issue with CRRT.  On ECMO.  Remains intubated and sedated.  Urine output around 1.6 L.  Objective Vital signs in last 24 hours: Vitals:   09/27/20 0500 09/27/20 0600 09/27/20 0700 09/27/20 0739  BP: 136/67 118/63 126/69 (!) 177/62  Pulse: (!) 109 (!) 110 (!) 109 (!) 127  Resp: $Remo'19 20 13 'SlZRx$ (!) 31  Temp:      TempSrc:      SpO2: 98% 94% 99% 93%  Weight:       Height:       Weight change: -5.3 kg  Intake/Output Summary (Last 24 hours) at 09/27/2020 0852 Last data filed at 09/27/2020 0800 Gross per 24 hour  Intake 4732.99 ml  Output 7040 ml  Net -2307.01 ml       Labs: Basic Metabolic Panel: Recent Labs  Lab 09/26/20 0403 09/26/20 0405 09/26/20 0643 09/26/20 0644 09/26/20 1548 09/26/20 1557 09/26/20 2002 09/27/20 0339 09/27/20 0351  NA 142   < > 141   < > 140   < > 141 140 141  K 4.1   < > 4.2   < > 5.0   < > 5.0 4.3 4.3  CL 109  --  108  --  108  --   --  107  --   CO2 26  --  25  --  25  --   --  26  --   GLUCOSE 96  --  85  --  151*  --   --  111*  --   BUN 70*  --  68*  --  57*  --   --  50*  --   CREATININE 0.92  --  0.95  --  0.83  --   --  0.77  --   CALCIUM 7.8*  --  7.7*  --  7.8*  --   --  7.9*  --   PHOS 2.9  --   --   --  4.2  --   --  3.4  --    < > = values in this  interval not displayed.   Liver Function Tests: Recent Labs  Lab 09/25/20 0400 09/25/20 1600 09/26/20 0403 09/26/20 1548 09/27/20 0339  AST 44*  --  49*  --  49*  ALT 59*  --  39  --  50*  ALKPHOS 113  --  112  --  138*  BILITOT 1.7*  --  1.7*  --  1.4*  PROT 4.5*  --  5.1*  --  5.4*  ALBUMIN 2.5*   < > 2.7*  2.6* 2.8* 2.8*  2.8*   < > = values in this interval not displayed.   No results for input(s): LIPASE, AMYLASE in the last 168 hours. Recent Labs  Lab 09/24/20 1128  AMMONIA 36*   CBC: Recent Labs  Lab 09/25/20 1555 09/25/20 1604 09/25/20 2030 09/26/20 0403 09/26/20 0405 09/26/20 1548 09/26/20 1557 09/26/20 2257 09/27/20 0339 09/27/20 0351  WBC 8.8  --  7.6 9.0  --  7.7  --   --  7.9  --   HGB 8.4*   < > 8.0* 8.7*   < > 8.3*   < > 7.5* 9.1* 8.8*  HCT 26.2*   < > 25.4* 27.5*   < > 25.7*   < > 23.8* 27.3* 26.0*  MCV 96.3  --  96.2 95.5  --  95.5  --   --  95.5  --   PLT 137*  --  129* 131*  --  108*  --   --  105*  --    < > = values in this interval not displayed.   Cardiac Enzymes: No results for  input(s): CKTOTAL, CKMB, CKMBINDEX, TROPONINI in the last 168 hours. CBG: Recent Labs  Lab 09/26/20 1553 09/26/20 1945 09/27/20 0041 09/27/20 0346 09/27/20 0745  GLUCAP 155* 103* 114* 110* 120*    Iron Studies: No results for input(s): IRON, TIBC, TRANSFERRIN, FERRITIN in the last 72 hours. Studies/Results: CT HEAD WO CONTRAST  Result Date: 09/25/2020 CLINICAL DATA:  Subarachnoid hemorrhage follow-up. EXAM: CT HEAD WITHOUT CONTRAST TECHNIQUE: Contiguous axial images were obtained from the base of the skull through the vertex without intravenous contrast. COMPARISON:  CT head December 26 21. FINDINGS: Brain: No substantial change in small volume subarachnoid hemorrhage within the left sylvian fissure. No evidence of new hemorrhage. No evidence of acute large vascular territory infarct. No abnormal mass effect or midline shift. No hydrocephalus. No mass lesion. Vascular: Calcific atherosclerosis. No hyperdense vessel identified. Skull: No acute fracture. Sinuses/Orbits: Frothy secretions and mucosal thickening the right sphenoid sinus. Other: Bilateral large mastoid effusions. IMPRESSION: 1. No substantial change in small volume subarachnoid hemorrhage within the left sylvian fissure. No evidence of new hemorrhage. 2. Bilateral large mastoid effusions. Electronically Signed   By: Margaretha Sheffield MD   On: 09/25/2020 10:05   DG CHEST PORT 1 VIEW  Result Date: 09/27/2020 CLINICAL DATA:  Recent COVID-19 positive. Respiratory failure. ECMO therapy EXAM: PORTABLE CHEST 1 VIEW COMPARISON:  September 26, 2020 FINDINGS: Tracheostomy catheter tip is 6.3 cm above the carina. ECMO catheter tip is in the inferior vena cava. Peripherally inserted left-sided central catheter tip in superior vena cava. Enteric tube tip is below the diaphragm. No pneumothorax. Widespread airspace opacity persists without appreciable change. Heart size and pulmonary vascularity are normal. No adenopathy. No bone lesions.  IMPRESSION: Tube and catheter positions as described without pneumothorax. Widespread airspace opacity persists without appreciable change. Stable cardiac silhouette. Electronically Signed   By: Lowella Grip III M.D.   On: 09/27/2020  08:00   DG CHEST PORT 1 VIEW  Result Date: 09/26/2020 CLINICAL DATA:  ECMO. EXAM: PORTABLE CHEST 1 VIEW COMPARISON:  09/25/2020.  09/24/2020.  CT 09/24/2020. FINDINGS: , Feeding tube, left PICC line, ECMO device in stable position. Heart size stable. Dense bilateral pulmonary infiltrates are again noted. Very slight improvement in aeration noted on today's exam. No pleural effusion or pneumothorax. IMPRESSION: 1. Lines and tubes in stable position. 2. Dense bilateral pulmonary infiltrates again noted. Very slight improvement in aeration noted on today's exam. Electronically Signed   By: Marcello Moores  Register   On: 09/26/2020 06:07    Medications: Infusions: .  prismasol BGK 4/2.5 500 mL/hr at 09/27/20 0600  .  prismasol BGK 4/2.5 300 mL/hr at 09/27/20 0600  . sodium chloride 10 mL/hr at 09/20/20 1900  . albumin human    . albumin human 12.5 g (09/27/20 0636)  . bivalirudin (ANGIOMAX) infusion 0.5 mg/mL (Non-ACS indications) 0.04 mg/kg/hr (09/27/20 0800)  . ceFEPime (MAXIPIME) IV Stopped (09/27/20 1610)  . feeding supplement (PIVOT 1.5 CAL) 1,000 mL (09/27/20 0600)  . fentaNYL infusion INTRAVENOUS 250 mcg/hr (09/27/20 0800)  . niCARDipine 5 mg/hr (09/27/20 0800)  . norepinephrine (LEVOPHED) Adult infusion Stopped (09/26/20 2335)  . prismasol BGK 4/2.5 1,500 mL/hr at 09/27/20 0806  . propofol (DIPRIVAN) infusion 20 mcg/kg/min (09/27/20 0800)  . vancomycin 1,000 mg (09/27/20 0834)    Scheduled Medications: . amLODipine  10 mg Per Tube QHS  . B-complex with vitamin C  1 tablet Per Tube Daily  . chlorhexidine gluconate (MEDLINE KIT)  15 mL Mouth Rinse BID  . Chlorhexidine Gluconate Cloth  6 each Topical Daily  . clonazePAM  2 mg Per Tube Q8H  . cloNIDine  0.2  mg Per Tube TID  . docusate  100 mg Per Tube BID  . feeding supplement (PROSource TF)  45 mL Per Tube QID  . fiber  1 packet Per Tube BID  . insulin aspart  2-6 Units Subcutaneous Q4H  . insulin aspart  3 Units Subcutaneous Q4H  . insulin detemir  13 Units Subcutaneous BID  . levothyroxine  50 mcg Per Tube Q0600  . mouth rinse  15 mL Mouth Rinse 10 times per day  . methylPREDNISolone (SOLU-MEDROL) injection  20 mg Intravenous Q24H  . metoprolol tartrate  25 mg Per Tube Q12H  . nystatin  5 mL Per Tube QID  . oxyCODONE  10 mg Per Tube Q6H  . pantoprazole sodium  40 mg Per Tube BID  . polyethylene glycol  17 g Per Tube BID  . pravastatin  40 mg Per Tube q1800  . QUEtiapine  150 mg Per Tube TID  . sodium chloride flush  10-40 mL Intracatheter Q12H    have reviewed scheduled and prn medications.  Physical Exam: Not much change from yesterday. General: Critically ill looking male lying on bed, has tracheostomy on vent.  Sedated. Heart:RRR, s1s2 nl Lungs: Coarse breath sound bilateral Abdomen:soft, Non-tender, non-distended Extremities: Pitting edema ++, anasarca+, not much change Dialysis Access: Right femoral HD catheter site clean.  Emelda Kohlbeck Prasad Roch Quach 09/27/2020,8:52 AM  LOS: 25 days

## 2020-09-27 NOTE — Progress Notes (Signed)
ANTICOAGULATION CONSULT NOTE  Pharmacy Consult for bivalirudin Indication: ECMO + bilateral DVTs + [facility] 12/26   Recent Labs    09/25/20 0400 09/25/20 0404 09/26/20 0403 09/26/20 0405 09/26/20 1548 09/26/20 1557 09/27/20 0339 09/27/20 0351 09/27/20 1456 09/27/20 1644 09/27/20 1701  HGB 8.3*   < > 8.7*   < > 8.3*   < > 9.1*   < > 7.8* 7.1* 8.0*  HCT 26.7*   < > 27.5*   < > 25.7*   < > 27.3*   < > 23.0* 21.0* 24.6*  PLT 136*   < > 131*  --  108*  --  105*  --   --   --  98*  APTT 50*   < > 53*  --  46*  --  47*  --   --   --  55*  LABPROT 18.1*  --  16.3*  --   --   --  15.7*  --   --   --   --   INR 1.6*  --  1.4*  --   --   --  1.3*  --   --   --   --   CREATININE 1.35*   < > 0.92   < > 0.83  --  0.77  --   --   --  0.73   < > = values in this interval not displayed.    Estimated Creatinine Clearance: 120 mL/min (by C-G formula based on SCr of 0.73 mg/dL).   Assessment: 64 yom unvaccinated presenting with severe COVID ARDs, intubated on 12/6, continued to have ventilation issues despite NMB and proning - started on VV ECMO on 12/8. Pt noted to have bilateral DVTs 12/8. Bivalirudin started for ECMO anticoagulation. Head CT 12/26 with new small SAH, repeat 12/27 stable without expansion - bivalirudin aPTT goal lowered.  aPTT is supratherapeutic at 55 seconds. H/H 8, plt 98 stable.  No issues with infusion per RN but urine is very dark with possible blood.   Goal of Therapy:  aPTT 40-50 seconds (new 12/26)  Monitor platelets by anticoagulation protocol: Yes   Plan:  Decrease bivalirudin to 0.035 mg/kg/hr  (weight 103kg) Check q12h aPTT and CBC  Monitor for S/Sx bleeding  Alphia Moh, PharmD, BCPS, BCCP Clinical Pharmacist  Please check AMION for all [facility] phone numbers After 10:00 PM, call Main Pharmacy [phone]

## 2020-09-28 ENCOUNTER — Inpatient Hospital Stay (HOSPITAL_COMMUNITY): Payer: HRSA Program

## 2020-09-28 DIAGNOSIS — Z515 Encounter for palliative care: Secondary | ICD-10-CM | POA: Diagnosis not present

## 2020-09-28 DIAGNOSIS — E870 Hyperosmolality and hypernatremia: Secondary | ICD-10-CM | POA: Diagnosis not present

## 2020-09-28 DIAGNOSIS — J9601 Acute respiratory failure with hypoxia: Secondary | ICD-10-CM | POA: Diagnosis not present

## 2020-09-28 DIAGNOSIS — Z7189 Other specified counseling: Secondary | ICD-10-CM | POA: Diagnosis not present

## 2020-09-28 DIAGNOSIS — J80 Acute respiratory distress syndrome: Secondary | ICD-10-CM | POA: Diagnosis not present

## 2020-09-28 DIAGNOSIS — U071 COVID-19: Secondary | ICD-10-CM | POA: Diagnosis not present

## 2020-09-28 LAB — RENAL FUNCTION PANEL
Albumin: 3 g/dL — ABNORMAL LOW (ref 3.5–5.0)
Albumin: 3 g/dL — ABNORMAL LOW (ref 3.5–5.0)
Anion gap: 5 (ref 5–15)
Anion gap: 7 (ref 5–15)
BUN: 42 mg/dL — ABNORMAL HIGH (ref 6–20)
BUN: 41 mg/dL — ABNORMAL HIGH (ref 6–20)
CO2: 28 mmol/L (ref 22–32)
CO2: 25 mmol/L (ref 22–32)
Calcium: 7.9 mg/dL — ABNORMAL LOW (ref 8.9–10.3)
Calcium: 8.3 mg/dL — ABNORMAL LOW (ref 8.9–10.3)
Chloride: 108 mmol/L (ref 98–111)
Chloride: 107 mmol/L (ref 98–111)
Creatinine, Ser: 0.63 mg/dL (ref 0.61–1.24)
Creatinine, Ser: 0.64 mg/dL (ref 0.61–1.24)
GFR, Estimated: >60 mL/min (ref >60)
GFR, Estimated: >60 mL/min (ref >60)
Glucose, Bld: 108 mg/dL — ABNORMAL HIGH (ref 70–99)
Glucose, Bld: 135 mg/dL — ABNORMAL HIGH (ref 70–99)
Phosphorus: 3.1 mg/dL (ref 2.5–4.6)
Phosphorus: 3 mg/dL (ref 2.5–4.6)
Potassium: 4.7 mmol/L (ref 3.5–5.1)
Potassium: 5.2 mmol/L — ABNORMAL HIGH (ref 3.5–5.1)
Sodium: 141 mmol/L (ref 135–145)
Sodium: 139 mmol/L (ref 135–145)

## 2020-09-28 LAB — POCT I-STAT 7, (LYTES, BLD GAS, ICA,H+H)
Acid-Base Excess: 1 mmol/L (ref 0.0–2.0)
Acid-Base Excess: 3 mmol/L — ABNORMAL HIGH (ref 0.0–2.0)
Acid-Base Excess: 3 mmol/L — ABNORMAL HIGH (ref 0.0–2.0)
Acid-Base Excess: 2 mmol/L (ref 0.0–2.0)
Acid-Base Excess: 2 mmol/L (ref 0.0–2.0)
Bicarbonate: 27.8 mmol/L (ref 20.0–28.0)
Bicarbonate: 29.1 mmol/L — ABNORMAL HIGH (ref 20.0–28.0)
Bicarbonate: 29.3 mmol/L — ABNORMAL HIGH (ref 20.0–28.0)
Bicarbonate: 28 mmol/L (ref 20.0–28.0)
Bicarbonate: 28 mmol/L (ref 20.0–28.0)
Calcium, Ion: 1.2 mmol/L (ref 1.15–1.40)
Calcium, Ion: 1.2 mmol/L (ref 1.15–1.40)
Calcium, Ion: 1.21 mmol/L (ref 1.15–1.40)
Calcium, Ion: 1.21 mmol/L (ref 1.15–1.40)
Calcium, Ion: 1.22 mmol/L (ref 1.15–1.40)
HCT: 28 % — ABNORMAL LOW (ref 39.0–52.0)
HCT: 25 % — ABNORMAL LOW (ref 39.0–52.0)
HCT: 26 % — ABNORMAL LOW (ref 39.0–52.0)
HCT: 27 % — ABNORMAL LOW (ref 39.0–52.0)
HCT: 27 % — ABNORMAL LOW (ref 39.0–52.0)
Hemoglobin: 9.5 g/dL — ABNORMAL LOW (ref 13.0–17.0)
Hemoglobin: 8.5 g/dL — ABNORMAL LOW (ref 13.0–17.0)
Hemoglobin: 8.8 g/dL — ABNORMAL LOW (ref 13.0–17.0)
Hemoglobin: 9.2 g/dL — ABNORMAL LOW (ref 13.0–17.0)
Hemoglobin: 9.2 g/dL — ABNORMAL LOW (ref 13.0–17.0)
O2 Saturation: 92 %
O2 Saturation: 91 %
O2 Saturation: 91 %
O2 Saturation: 93 %
O2 Saturation: 95 %
Patient temperature: 97.9
Patient temperature: 36.4
Patient temperature: 98.5
Patient temperature: 36.9
Potassium: 4.6 mmol/L (ref 3.5–5.1)
Potassium: 4.6 mmol/L (ref 3.5–5.1)
Potassium: 5.8 mmol/L — ABNORMAL HIGH (ref 3.5–5.1)
Potassium: 5.4 mmol/L — ABNORMAL HIGH (ref 3.5–5.1)
Potassium: 4.9 mmol/L (ref 3.5–5.1)
Sodium: 142 mmol/L (ref 135–145)
Sodium: 142 mmol/L (ref 135–145)
Sodium: 138 mmol/L (ref 135–145)
Sodium: 140 mmol/L (ref 135–145)
Sodium: 140 mmol/L (ref 135–145)
TCO2: 29 mmol/L (ref 22–32)
TCO2: 31 mmol/L (ref 22–32)
TCO2: 31 mmol/L (ref 22–32)
TCO2: 30 mmol/L (ref 22–32)
TCO2: 29 mmol/L (ref 22–32)
pCO2 arterial: 56.7 mmHg — ABNORMAL HIGH (ref 32.0–48.0)
pCO2 arterial: 55.1 mmHg — ABNORMAL HIGH (ref 32.0–48.0)
pCO2 arterial: 51.9 mmHg — ABNORMAL HIGH (ref 32.0–48.0)
pCO2 arterial: 52.9 mmHg — ABNORMAL HIGH (ref 32.0–48.0)
pCO2 arterial: 47 mmHg (ref 32.0–48.0)
pH, Arterial: 7.296 — ABNORMAL LOW (ref 7.350–7.450)
pH, Arterial: 7.33 — ABNORMAL LOW (ref 7.350–7.450)
pH, Arterial: 7.357 (ref 7.350–7.450)
pH, Arterial: 7.332 — ABNORMAL LOW (ref 7.350–7.450)
pH, Arterial: 7.383 (ref 7.350–7.450)
pO2, Arterial: 70 mmHg — ABNORMAL LOW (ref 83.0–108.0)
pO2, Arterial: 68 mmHg — ABNORMAL LOW (ref 83.0–108.0)
pO2, Arterial: 63 mmHg — ABNORMAL LOW (ref 83.0–108.0)
pO2, Arterial: 74 mmHg — ABNORMAL LOW (ref 83.0–108.0)
pO2, Arterial: 78 mmHg — ABNORMAL LOW (ref 83.0–108.0)

## 2020-09-28 LAB — GLUCOSE, CAPILLARY
Glucose-Capillary: 110 mg/dL — ABNORMAL HIGH (ref 70–99)
Glucose-Capillary: 114 mg/dL — ABNORMAL HIGH (ref 70–99)
Glucose-Capillary: 117 mg/dL — ABNORMAL HIGH (ref 70–99)
Glucose-Capillary: 125 mg/dL — ABNORMAL HIGH (ref 70–99)
Glucose-Capillary: 117 mg/dL — ABNORMAL HIGH (ref 70–99)
Glucose-Capillary: 147 mg/dL — ABNORMAL HIGH (ref 70–99)

## 2020-09-28 LAB — BPAM RBC
Blood Product Expiration Date: 202201252359
Blood Product Expiration Date: 202201272359
Blood Product Expiration Date: 202201272359
Blood Product Expiration Date: 202202012359
Blood Product Expiration Date: 202202012359
Blood Product Expiration Date: 202202012359
Blood Product Expiration Date: 202202032359
ISSUE DATE / TIME: 202112270913
ISSUE DATE / TIME: 202112271943
ISSUE DATE / TIME: 202112272107
ISSUE DATE / TIME: 202112290008
ISSUE DATE / TIME: 202112292023
Unit Type and Rh: 5100
Unit Type and Rh: 5100
Unit Type and Rh: 5100
Unit Type and Rh: 5100
Unit Type and Rh: 5100
Unit Type and Rh: 5100
Unit Type and Rh: 5100

## 2020-09-28 LAB — FIBRINOGEN: Fibrinogen: 507 mg/dL — ABNORMAL HIGH (ref 210–475)

## 2020-09-28 LAB — CBC WITH DIFFERENTIAL/PLATELET
Abs Immature Granulocytes: 0.59 10*3/uL — ABNORMAL HIGH (ref 0.00–0.07)
Basophils Absolute: 0.1 10*3/uL (ref 0.0–0.1)
Basophils Relative: 1 %
Eosinophils Absolute: 1.5 10*3/uL — ABNORMAL HIGH (ref 0.0–0.5)
Eosinophils Relative: 16 %
HCT: 28 % — ABNORMAL LOW (ref 39.0–52.0)
Hemoglobin: 9.1 g/dL — ABNORMAL LOW (ref 13.0–17.0)
Immature Granulocytes: 6 %
Lymphocytes Relative: 11 %
Lymphs Abs: 1.1 10*3/uL (ref 0.7–4.0)
MCH: 30.7 pg (ref 26.0–34.0)
MCHC: 32.5 g/dL (ref 30.0–36.0)
MCV: 94.6 fL (ref 80.0–100.0)
Monocytes Absolute: 0.5 10*3/uL (ref 0.1–1.0)
Monocytes Relative: 5 %
Neutro Abs: 6 10*3/uL (ref 1.7–7.7)
Neutrophils Relative %: 61 %
Platelets: 104 10*3/uL — ABNORMAL LOW (ref 150–400)
RBC: 2.96 MIL/uL — ABNORMAL LOW (ref 4.22–5.81)
RDW: 17.3 % — ABNORMAL HIGH (ref 11.5–15.5)
WBC: 9.8 10*3/uL (ref 4.0–10.5)
nRBC: 0.3 % — ABNORMAL HIGH (ref 0.0–0.2)

## 2020-09-28 LAB — TYPE AND SCREEN
ABO/RH(D): O POS
Antibody Screen: NEGATIVE
Unit division: 0
Unit division: 0
Unit division: 0
Unit division: 0
Unit division: 0
Unit division: 0
Unit division: 0

## 2020-09-28 LAB — CBC
HCT: 34.3 % — ABNORMAL LOW (ref 39.0–52.0)
Hemoglobin: 11.4 g/dL — ABNORMAL LOW (ref 13.0–17.0)
MCH: 31.6 pg (ref 26.0–34.0)
MCHC: 33.2 g/dL (ref 30.0–36.0)
MCV: 95 fL (ref 80.0–100.0)
Platelets: 80 10*3/uL — ABNORMAL LOW (ref 150–400)
RBC: 3.61 MIL/uL — ABNORMAL LOW (ref 4.22–5.81)
RDW: 17.3 % — ABNORMAL HIGH (ref 11.5–15.5)
WBC: 5.7 10*3/uL (ref 4.0–10.5)
nRBC: 0.7 % — ABNORMAL HIGH (ref 0.0–0.2)

## 2020-09-28 LAB — APTT
aPTT: 54 seconds — ABNORMAL HIGH (ref 24–36)
aPTT: 43 seconds — ABNORMAL HIGH (ref 24–36)

## 2020-09-28 LAB — MAGNESIUM: Magnesium: 3 mg/dL — ABNORMAL HIGH (ref 1.7–2.4)

## 2020-09-28 LAB — TRIGLYCERIDES: Triglycerides: 53 mg/dL (ref <150)

## 2020-09-28 LAB — LACTIC ACID, PLASMA
Lactic Acid, Venous: 0.8 mmol/L (ref 0.5–1.9)
Lactic Acid, Venous: 1.2 mmol/L (ref 0.5–1.9)

## 2020-09-28 LAB — LACTATE DEHYDROGENASE: LDH: 488 U/L — ABNORMAL HIGH (ref 98–192)

## 2020-09-28 MED ORDER — NICARDIPINE HCL IN NACL 40-0.83 MG/200ML-% IV SOLN
0.0000 mg/h | INTRAVENOUS | Status: DC
  Administered 2020-09-28: 19:00:00 8 mg/h via INTRAVENOUS
  Administered 2020-09-28: 10:00:00 2 mg/h via INTRAVENOUS
  Administered 2020-09-29: 14.5 mg/h via INTRAVENOUS
  Administered 2020-09-29: 6.5 mg/h via INTRAVENOUS
  Administered 2020-09-29: 14.5 mg/h via INTRAVENOUS
  Administered 2020-09-30: 8 mg/h via INTRAVENOUS
  Filled 2020-09-28 (×11): qty 200

## 2020-09-28 NOTE — Progress Notes (Signed)
NAME:  Donald Rowe, MRN:  [id number], DOB:  07-17-1961, LOS: 2 ADMISSION DATE:  09/01/2020, CONSULTATION DATE:  12/6 REFERRING MD:  Dr. Aileen Fass, CHIEF COMPLAINT:  SOB    Brief History   59 y/o M admitted 12/4 with 1 week hx of SOB, known COVID positive.  He is unvaccinated.  CTA chest negative for PE but demonstrated diffuse bilateral infiltrates. Ziebach started on 12/8   Past Medical History  Hypothyroidism  [facility] Events/Procedures  12/04 Admit  12/06 PCCM consulted  12/07 Intubated, central line, a line 12/08 VV ECMO Cannulaton, cortrak 12/13 circuit change for hemolytic anemia 12/16 Tracheostomy  12/17 bronch w/ BAL  Consults:  PCCM, Heart failure, TCTS, ECMO  Significant Diagnostic Tests:   CTA Chest 12/4 >> extensive bilateral airspace disease, no large PE identified, limited study   LE Venous Duplex 12/4 >> negative for DVT bilaterally   LE venous duplex 12/8> BLE DVTs  RUE venous duplex 12/23  RUE arterial duplex 12/23  CT head 12/26 : Minimal SAH on left side  CT abdomen and pelvis 12/26: Extensive bilateral airspace disease with small pleural effusion  CT head 12/27: Subarachnoid unchanged  Micro Data:  COVID 12/4 >> negative  Influenza A/B 12/4 >> negative  MRSA PCR 12/5 >> negative  BCx2 12/4 >> NG Trach aspirate 12/12> rare MRSA BAL 12/17> MRSA, candida Aspergillus Ag BAL 12/17> 0.05 12/18 sars2: positive  Antimicrobials:  Azithromycin 12/5 >> 12/6  Ceftriaxone 12/5 >> 12/6  Cefepime 12/7>12/14 vanc 12/14>12/20 Meropenem 12/17>12/21  Interim history/subjective:  Condition remains unchanged: continues to have swings in BP related to non-purposeful agitation. Tolerated fluid removal at 53m/h.  Objective   Blood pressure 139/81, pulse (!) 120, temperature 98.4 F (36.9 C), temperature source Oral, resp. rate (!) 21, height _0  (1.753 m), weight 105.7 kg, SpO2 96 %.    Vent Mode: PCV FiO2 (%):  [50 %] 50 % Set  Rate:  [12 bmp] 12 bmp PEEP:  [10 cmH20] 10 cmH20 Pressure Support:  [10 cmH20] 10 cmH20 Plateau Pressure:  [22 cmH20-24 cmH20] 22 cmH20   Intake/Output Summary (Last 24 hours) at 09/28/2020 0801 Last data filed at 09/28/2020 0700 Gross per 24 hour  Intake 4266.96 ml  Output 6323 ml  Net -2056.04 ml   Filed Weights   09/26/20 0500 09/27/20 0444 09/28/20 0500  Weight: 110.4 kg 107.1 kg 105.7 kg    Examination: Gen: Well-developed, acutely ill-appearing obese male, lying on the bed  HEENT: NCAT, perrla, s/p trach Neck: supple, ECMO lines intact and functioning CV: Tachycardic, regular rhythm, no murmur  Pulm: Bilateral crackles, no wheezes or rhonchi, improved air entry Abd: Soft, BS+, NTND Extm: Peripheral pulses intact 2+ radial pulse on RUE, 1+ edema Skin: Dry, Warm,  Neuro: Eyes open but not following commands.  Nonpurposefully moving all 4 extremities.  Pupils 3 mm minimally reactive  Resolved Hospital Problem list     Assessment & Plan:   Acute hypoxemic and hypercarbic respiratory failure secondary to COVID PNA with severe ARDS. S/p VV ECMO cannulation 09/29/2020.  S/p tracheostomy 164/40complicated by trach site bleeding improved Left-sided subarachnoid hemorrhage Community-acquired pneumonia with MRSA Completed remdesivir.  Completed baricitinib.  c/w full ECMO support Taper Solu-Medrol off Completed for treatment with 4 sessions of Plex CRRT for fluid removal  . Agitated delirium requiring titration of IV sedation.  Periods of severe agitation are limiting ability to wean ECMO Titrate propofol to off  Continue and fentanyl infusion Minimize sedation  with RASS goal 1 and tolerance of MV & ECMO Increase enteral seroquel, oxycodone and clonazepam Taper steroids to off as may be contributing to delirium  Bilateral lower ext DVTs- provoked by COVID infection Continue bivalirudin,.   Steroid-induced hyperglycemia Fingersticks are better controlled now Decrease  Levemir with steroid wean. Continue TF coverage (but decreasing) and SSI PRN  Hypothyroidism con't PTA synthroid   Labile hypertension, contributed by periods of agitation.  Patient blood pressure remained labile ranging  Currently on clonidine, hydralazine and metoprolol Titrate Cardene as necessary  Hemolytic Anemia Throbocytopenia Hyperbilirubinemia Rheumatologic-associated MAHA remains in differential.  hgb and plt are stable Completed treatment with the Plex for 4 sessions Hemoglobin dropped to 7.4, received 2 PRBCs in last 24 hours Transfuse if hgb <8  Best practice (evaluated daily)  Diet: TF Pain/Anxiety/Delirium protocol (if indicated): fentanyl and precedex VAP protocol (if indicated): yes DVT prophylaxis: Bivalirudin GI prophylaxis: pantoprazole  BID Glucose control: basal-bolus Mobility: As tolerated Last date of multidisciplinary goals of care discussion: wife updated over the phone 12/23 Summary of discussion con't aggressive care Follow up goals of care discussion due: 12/30 Code Status: full Disposition: ICU   Total critical care time:40  minutes   Critical care time was exclusive of separately billable procedures and treating other patients.   Critical care was necessary to treat or prevent imminent or life-threatening deterioration.   Critical care was time spent personally by me on the following activities: development of treatment plan with patient and/or surrogate as well as nursing, discussions with consultants, evaluation of patient's response to treatment, examination of patient, obtaining history from patient or surrogate, ordering and performing treatments and interventions, ordering and review of laboratory studies, ordering and review of radiographic studies, pulse oximetry and re-evaluation of patient's condition.   Kipp Brood, MD [facility] ICU Physician [place]  Pager: [phone] Or Epic Secure Chat After hours:  [phone].  09/28/2020, 8:01 AM

## 2020-09-28 NOTE — Progress Notes (Signed)
Palliative:  HPI:59 y.o.malewith a pertinent history ofhypothyroidism on levothyroxine and hyperlipidemia who was diagnosed with Covid 1 week ago and is not vaccinated. With EMS he was seen to bedesaturatingto 78% on room air. He states he started having symptoms on Friday and tested positive on Saturday.Had initially elected to be DNAR without intubation though later changed this and got intubated on 12/6 in the setting of worsening respiratory failure with hypoxia. CTA chest w/diffuse bilateral infiltrates.On 12/8 was cannulated for VV ECMO.Had been extubated on 12/9 then emergently reintubated in early eveningdue to clinical instability. Palliative care was asked to get involved in the setting of VV ECMO to set goals and expectations withJ.R.'sfamily.He continues on VV ECMO and ventilator support.ECMO circuit change 09/11/20.PLEX began 12/15.  I met today at [name]'s bedside. Not much change. Ongoing efforts to wean down to off IV sedation. He continues with no purposeful movements or following commands. Ongoing concern for neurological status. Continuing efforts to wean ECMO/vent support as tolerated.   I called and spoke with Donald Rowe. Shared with her above and not much change. She asks about potential GIB and need for blood transfusions. We discussed that currently the plan is to monitor. He has not had severe drops in Hgb and no gross large volume bleeding events. Potentially could be secondary to trauma of rectal tube complicated by blood thinners as well. Otherwise conservative support is appropriate at this time. Further investigation at this stage is likely more risky than beneficial but this will be reassessed if he were to have worsening bleeding. Donald Rowe understands and agrees with plan.   Otherwise we are in somewhat of a holding pattern with hopes that this is a severe delirium that may improve over time. Donald Rowe shares that they have a clergy member that she would like to visit with  [name] but also wants to ensure this will not take [name]'s visitation away. We reviewed policy that allows for clergy visit without taking away from one person per day visitation for family.   All questions/concerns addressed. Emotional support provided.   Exam: Sedated on ventvia trach. VV ECMO. Cortrak in place.No distress.50% FiO2 and tolerating ventwith no dyssynchrony. Extremities warm to touch. Toes cool.Abdsoft.  Plan: - Continue aggressive care with hopes of improvement.   [place], NP Palliative Medicine Team Pager [phone] (Please see [email or web address] for schedule) Team Phone [phone]    Greater than 50%  of this time was spent counseling and coordinating care related to the above assessment and plan

## 2020-09-28 NOTE — Progress Notes (Signed)
Physical Therapy Treatment Patient Details Name: Donald Rowe MRN: [id number] DOB: 1961/05/07 Today's Date: 09/28/2020    History of Present Illness 59 y.o. male with a pertinent history of hypothyroidism on levothyroxine and hyperlipidemia who was diagnosed with Covid 11/27 and is not vaccinated.  With EMS SaO2 on RA 78%O2. ED patient was increased to 6 L oxygen satting about 92%,. CTA of the Chest was assessed and negative for pulmonary embolism but showed diffuse bilateral infiltrates. He was treated with IV remdesivir, steroids and baricitinib.  Initially, he was also treated with antibiotics but these were stopped on 12/6 with a low procalcitonin. 12/07 intubated, central line, A line, 12/08 ECMO cannulation, cortrak, attempted extubation 12/9 failed reintubated. Trach placed 12/16. CCRT started 12/26.    PT Comments    Pt admitted with above diagnosis. Pt met 0/5 goals and goals downgraded today.  Pt frequency also decreased to 2xweek as well as this is more appropriate given pts progress.  Will continue to alternate days with OT to provide therapy coverage 4 x week. Nursing still tilting pt in conjunction with PT tilts. Today attempts to reassess pts arousal.  Pt did not follow commands except questionably turned his head to my voice.  There is more spontaneous movement of UE and LEs with pt moving right hemibody more than left.  Pt continues to make slow progress.  Will continue to follow pt.  Pt currently with functional limitations due to cognitive, balance, endurance deficits as well as deconditioning. Pt will benefit from skilled PT to increase their independence and safety with mobility to allow discharge to the venue listed below.     Follow Up Recommendations  CIR     Equipment Recommendations   (TBD)    Recommendations for Other Services       Precautions / Restrictions Precautions Precautions: Other (comment) Precaution Comments: ECMO, cortrak, vent via trach, condom cath,  CRRT via femoral catheter Restrictions Weight Bearing Restrictions: No    Mobility  Bed Mobility               General bed mobility comments: Continued: Total A +2 for reposition in bed.  Transfers                    Ambulation/Gait                 Stairs             Wheelchair Mobility    Modified Rankin (Stroke Patients Only)       Balance Overall balance assessment: Needs assistance                                          Cognition Arousal/Alertness: Lethargic Behavior During Therapy: Restless;Flat affect Overall Cognitive Status: Difficult to assess                                 General Comments: not following commands      Exercises General Exercises - Upper Extremity Shoulder Flexion: Left;10 reps;Supine;AAROM Shoulder ABduction: Both;Supine;10 reps;AAROM (tilt bed at 45*) Elbow Flexion: PROM;10 reps;Both;Supine Elbow Extension: 10 reps;Both;PROM;Supine General Exercises - Lower Extremity Ankle Circles/Pumps: PROM;Both;10 reps;Supine Heel Slides: PROM;Left;10 reps;Supine Hip ABduction/ADduction: PROM;Left;10 reps;Supine Other Exercises Other Exercises: Pt with cervical rotation spontaneous while PT in room    General Comments General  comments (skin integrity, edema, etc.): Spontaneous movement of Right > Left hemibody.  UEs >LEs but all 4 have spontaneous movbement.  Appered to turn his head to my voice as well. 50% FiO2 with PEEP 10; 103 bpm, 94% O2, 164/70      Pertinent Vitals/Pain Pain Assessment: Faces Faces Pain Scale: Hurts a little bit Pain Location: generalized with limb movement Pain Descriptors / Indicators: Grimacing Pain Intervention(s): Limited activity within patient's tolerance;Monitored during session;Repositioned    Home Living                      Prior Function            PT Goals (current goals can now be found in the care plan section) Acute Rehab PT  Goals Patient Stated Goal: unable PT Goal Formulation: Patient unable to participate in goal setting Time For Goal Achievement: 2020-10-27 Potential to Achieve Goals: Poor Progress towards PT goals: Goals downgraded-see care plan    Frequency    Min 2X/week      PT Plan Current plan remains appropriate;Frequency needs to be updated    Co-evaluation              AM-PAC PT "6 Clicks" Mobility   Outcome Measure  Help needed turning from your back to your side while in a flat bed without using bedrails?: Total Help needed moving from lying on your back to sitting on the side of a flat bed without using bedrails?: Total Help needed moving to and from a bed to a chair (including a wheelchair)?: Total Help needed standing up from a chair using your arms (e.g., wheelchair or bedside chair)?: Total Help needed to walk in hospital room?: Total Help needed climbing 3-5 steps with a railing? : Total 6 Click Score: 6    End of Session   Activity Tolerance: Patient limited by fatigue;Patient limited by lethargy Patient left: in bed;with call bell/phone within reach Nurse Communication: Other (comment) (tilt schedule goal 3x/day) PT Visit Diagnosis: Muscle weakness (generalized) (M62.81);Difficulty in walking, not elsewhere classified (R26.2)     Time: 0034-9179 PT Time Calculation (min) (ACUTE ONLY): 23 min  Charges:  $Therapeutic Exercise: 8-22 mins $Therapeutic Activity: 8-22 mins                     Cheo Selvey W,PT Acute Rehabilitation Services Pager:  [phone]  Office:  [place] 09/28/2020, 1:45 PM

## 2020-09-28 NOTE — Progress Notes (Signed)
Patient transported on vent from 2H23 to 2H24 without complications.

## 2020-09-28 NOTE — Procedures (Signed)
Extracorporeal support note     ECLS cannulation: 09/26/20 Last circuit change: 09/11/2020  Indication: COVD ARDS   Configuration: VV, RIJ 32Fr [place]   Pump speed: 3200 Pump flow: 3.8 L Pump used: Cardiohelp   Sweep gas: 100%, 8.5 LPM   Circuit check:  No clot noted Anticoagulant: bivalirudin   Changes in support: continue current support.  Wean sweep as tolerated. Can lower flow rates as oxygenation has not been an issue.    anticipated goals/duration of support: bridge to recovery, day 22 of support.   Lynnell Catalan, MD [facility] ICU Physician [facility] [place] [facility]  Pager: [phone] Or Epic Secure Chat After hours: [phone].  09/28/2020, 7:55 AM

## 2020-09-28 NOTE — Progress Notes (Signed)
ANTICOAGULATION CONSULT NOTE  Pharmacy Consult for bivalirudin Indication: ECMO + bilateral DVTs + SAH 12/26   Recent Labs    09/26/20 0403 09/26/20 0405 09/27/20 0339 09/27/20 0351 09/27/20 1701 09/27/20 1940 09/28/20 0301 09/28/20 0302 09/28/20 0403 09/28/20 1206 09/28/20 1534 09/28/20 1600 09/28/20 1700  HGB 8.7*   < > 9.1*   < > 8.0*   < >  --  11.4*  9.5* 8.5* 8.8* 9.2*  --   --   HCT 27.5*   < > 27.3*   < > 24.6*   < >  --  34.3*  28.0* 25.0* 26.0* 27.0*  --   --   PLT 131*   < > 105*  --  98*  --   --  80*  --   --   --   --   --   APTT 53*   < > 47*  --  55*  --  54*  --   --   --   --   --  43*  LABPROT 16.3*  --  15.7*  --   --   --   --   --   --   --   --   --   --   INR 1.4*  --  1.3*  --   --   --   --   --   --   --   --   --   --   CREATININE 0.92   < > 0.77  --  0.73  --   --  0.63  --   --   --  0.64  --    < > = values in this interval not displayed.    Estimated Creatinine Clearance: 119.1 mL/min (by C-G formula based on SCr of 0.64 mg/dL).   Assessment: 12 yom unvaccinated presenting with severe COVID ARDs, intubated on 12/6, continued to have ventilation issues despite NMB and proning - started on VV ECMO on 12/8. Pt noted to have bilateral DVTs 12/8. Bivalirudin started for ECMO anticoagulation. Head CT 12/26 with new small SAH, repeat 12/27 stable without expansion - bivalirudin aPTT goal lowered.  aPTT tonight therapeutic at 43 seconds.  Goal of Therapy:  aPTT 40-50 seconds (new 12/26)  Monitor platelets by anticoagulation protocol: Yes   Plan:  Continue bivalirudin at 0.03 mg/kg/hr (weight 103kg) Check q12h aPTT and CBC  Monitor for S/Sx bleeding   Fredonia Highland, PharmD, BCPS, [facility] Clinical Pharmacist [phone] Please check AMION for all [facility] Pharmacy numbers 09/28/2020

## 2020-09-28 NOTE — Progress Notes (Signed)
[facility] NEPHROLOGY PROGRESS NOTE  Assessment/ Plan: Pt is a 59 y.o. 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  # AKI, nonoliguric: due to ischemic ATN associted with covid-19 infection, IV contrast. Nonoliguric however complicated by hypernatremia and anasarca.  He was treated with free water for hypernatremia causing fluid overload. Azotemia due to steroid, tube feed, hemolysis and the use of diuretics. Right femoral HD catheter placed 09/13/20 by PCCM.  Discussed about possible need for catheter exchange however, Dr. Lynetta Mare wants to use current femoral catheter especially since it is clean and no sign of infection. Started CRRT on 12/27, all 4K bath, UF 50-100 cc/hour as tolerated by BP.  On Angiomax for anticoagulation.  Tolerating well so far, no change today.  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  LDH stable.   #Acute hypoxic respiratory failure/ARDS- secondary to covid-19 infection on VV ECMO. S/p trach 09/14/20.  # Hypernatremia: Now managed with CRRT.  Discontinued Lasix.   # Bilateral lower extremity DVT's- on bivalirudin.  # HCAP/MRSA pneumonitis/tracheitis- on IV vancomycin.  # HTN/volume: On nicardipine.  CRRT to manage volume.   Subjective: Seen and examined in ICU.  Tolerating CRRT well.  Some intermittent agitation on vent.  Urine output around 1.7 L.  Objective Vital signs in last 24 hours: Vitals:   09/28/20 0500 09/28/20 0600 09/28/20 0700 09/28/20 0812  BP: 135/64 129/81 139/81 (!) 181/75  Pulse: (!) 115 (!) 110 (!) 120   Resp: 18 (!) 22 (!) 21   Temp:      TempSrc:      SpO2: 94% 98% 96%    Weight: 105.7 kg     Height:       Weight change: -1.4 kg  Intake/Output Summary (Last 24 hours) at 09/28/2020 0833 Last data filed at 09/28/2020 0700 Gross per 24 hour  Intake 4266.96 ml  Output 6323 ml  Net -2056.04 ml       Labs: Basic Metabolic Panel: Recent Labs  Lab 09/27/20 0339 09/27/20 0351 09/27/20 1701 09/27/20 1940 09/28/20 0302 09/28/20 0403  NA 140   < > 140 141 141  142 142  K 4.3   < > 5.2* 5.0 4.7  4.6 4.6  CL 107  --  107  --  108  --   CO2 26  --  25  --  28  --   GLUCOSE 111*  --  134*  --  108*  --   BUN 50*  --  51*  --  42*  --   CREATININE 0.77  --  0.73  --  0.63  --   CALCIUM 7.9*  --  7.8*  --  7.9*  --   PHOS 3.4  --  3.8  --  3.1  --    < > = values in this interval not displayed.   Liver Function Tests: Recent Labs  Lab 09/25/20 0400 09/25/20 1600 09/26/20 0403 09/26/20 1548 09/27/20 0339 09/27/20 1701 09/28/20 0302  AST 44*  --  49*  --  49*  --   --   ALT 59*  --  39  --  50*  --   --   ALKPHOS 113  --  112  --  138*  --   --   BILITOT 1.7*  --  1.7*  --  1.4*  --   --   PROT 4.5*  --  5.1*  --  5.4*  --   --   ALBUMIN 2.5*   < > 2.7*  2.6*   < > 2.8*  2.8* 2.8* 3.0*   < > = values in this interval not displayed.   No results for input(s): LIPASE, AMYLASE in the last 168 hours. Recent Labs  Lab 09/24/20 1128  AMMONIA 36*   CBC: Recent Labs  Lab 09/26/20 0403 09/26/20 0405 09/26/20 1548 09/26/20 1557 09/27/20 0339 09/27/20 0351 09/27/20 1701 09/27/20 1940 09/27/20 1942 09/28/20 0302 09/28/20 0403  WBC 9.0  --  7.7  --  7.9  --  6.0  --   --  5.7  --   HGB 8.7*   < > 8.3*   < > 9.1*   < > 8.0*   < > 7.8* 11.4*  9.5* 8.5*  HCT 27.5*   < > 25.7*   < > 27.3*   < > 24.6*   < > 24.6* 34.3*  28.0* 25.0*  MCV 95.5  --  95.5  --  95.5  --  96.1  --   --  95.0  --   PLT 131*  --  108*  --  105*  --  98*  --   --  80*  --    < > = values in this interval not displayed.   Cardiac Enzymes: No results for  input(s): CKTOTAL, CKMB, CKMBINDEX, TROPONINI in the last 168 hours. CBG: Recent Labs  Lab 09/27/20 1521 09/27/20 1940 09/28/20 0019 09/28/20 0300 09/28/20 0808  GLUCAP 161* 116* 114* 110* 117*    Iron Studies: No results for input(s): IRON, TIBC, TRANSFERRIN, FERRITIN in the last 72 hours. Studies/Results: DG CHEST PORT 1 VIEW  Result Date: 09/28/2020 CLINICAL DATA:  59 year old male COVID-28. EXAM: PORTABLE CHEST 1 VIEW COMPARISON:  Portable chest 09/27/2020 and earlier. FINDINGS: Portable AP semi upright view at 0527 hours. Stable ECMO cannula. Stable tracheostomy tube, left PICC line, visible enteric feeding tube. Mildly lower lung volumes. Mediastinal contours remain within normal limits. No pneumothorax. Patchy and confluent bilateral pulmonary opacity. Improved perihilar ventilation since 09/26/2020. No definite pleural effusion. Paucity of bowel gas in the upper abdomen. IMPRESSION: 1.  Stable lines and tubes. 2. Severe bilateral pneumonia with mildly improved ventilation in both lungs since 09/26/2020. Electronically Signed   By: Genevie Ann M.D.   On: 09/28/2020 07:21   DG CHEST PORT 1 VIEW  Result Date: 09/27/2020 CLINICAL DATA:  Recent COVID-19 positive. Respiratory failure. ECMO therapy EXAM: PORTABLE CHEST 1 VIEW COMPARISON:  September 26, 2020 FINDINGS: Tracheostomy catheter tip is 6.3 cm above the carina. ECMO catheter tip is in the inferior vena cava. Peripherally inserted left-sided central catheter tip in superior vena cava. Enteric tube tip is below the diaphragm. No pneumothorax. Widespread airspace opacity persists without appreciable change. Heart size and pulmonary vascularity are normal. No adenopathy. No bone lesions. IMPRESSION: Tube and catheter positions as described without pneumothorax. Widespread airspace opacity persists without appreciable change. Stable cardiac silhouette. Electronically Signed   By: Lowella Grip III M.D.   On: 09/27/2020 08:00   DG Abd  Portable 1V  Result Date: 09/27/2020 CLINICAL DATA:  Ileus EXAM: PORTABLE ABDOMEN - 1 VIEW COMPARISON:  September 06, 2020 FINDINGS: Feeding tube in the small bowel at the level of ligament of  Treitz. ECMO cannula in place projects over the T12 level entering via superior approach, off the field of the radiograph. Stable position compared to prior CT evaluation. Diffuse pulmonary opacities with similar appearance, partially imaged. Scattered gas-filled loops of small bowel without signs of significant dilation. Gas and stool throughout the colon. Small amount of gas in the mid rectum. No acute skeletal process to the extent evaluated. IMPRESSION: 1. Feeding tube in the small bowel at the level of the ligament of Treitz. 2. Scattered gas-filled loops of small bowel without signs of significant dilation, may reflect mild ileus. 3. ECMO cannula passing below the RIGHT hemidiaphragm tip at the upper margin of T12 as on prior CT. 4. Diffuse interstitial and airspace opacities at the lung bases as on the prior study. Electronically Signed   By: Zetta Bills M.D.   On: 09/27/2020 11:22    Medications: Infusions: .  prismasol BGK 4/2.5 500 mL/hr at 09/28/20 0116  .  prismasol BGK 4/2.5 300 mL/hr at 09/28/20 0115  . sodium chloride 10 mL/hr at 09/20/20 1900  . albumin human    . albumin human Stopped (09/27/20 2009)  . bivalirudin (ANGIOMAX) infusion 0.5 mg/mL (Non-ACS indications) 0.03 mg/kg/hr (09/28/20 0601)  . ceFEPime (MAXIPIME) IV Stopped (09/28/20 0536)  . feeding supplement (PIVOT 1.5 CAL) 70 mL/hr at 09/28/20 0600  . fentaNYL infusion INTRAVENOUS 275 mcg/hr (09/28/20 0601)  . niCARDipine    . norepinephrine (LEVOPHED) Adult infusion Stopped (09/26/20 2335)  . prismasol BGK 4/2.5 1,500 mL/hr at 09/28/20 0444  . propofol (DIPRIVAN) infusion 10 mcg/kg/min (09/28/20 0601)  . vancomycin Stopped (09/27/20 2142)    Scheduled Medications: . amLODipine  10 mg Per Tube QHS  . B-complex with vitamin C   1 tablet Per Tube Daily  . chlorhexidine gluconate (MEDLINE KIT)  15 mL Mouth Rinse BID  . Chlorhexidine Gluconate Cloth  6 each Topical Daily  . clonazePAM  2 mg Per Tube Q8H  . cloNIDine  0.2 mg Per Tube TID  . docusate  100 mg Per Tube BID  . feeding supplement (PROSource TF)  45 mL Per Tube QID  . fiber  1 packet Per Tube BID  . insulin aspart  2-6 Units Subcutaneous Q4H  . insulin aspart  3 Units Subcutaneous Q4H  . insulin detemir  13 Units Subcutaneous BID  . levothyroxine  50 mcg Per Tube Q0600  . mouth rinse  15 mL Mouth Rinse 10 times per day  . metoprolol tartrate  25 mg Per Tube Q12H  . nystatin  5 mL Per Tube QID  . oxyCODONE  10 mg Per Tube Q6H  . pantoprazole sodium  40 mg Per Tube BID  . polyethylene glycol  17 g Per Tube BID  . pravastatin  40 mg Per Tube q1800  . QUEtiapine  150 mg Per Tube TID  . sodium chloride flush  10-40 mL Intracatheter Q12H    have reviewed scheduled and prn medications.  Physical Exam: Not much change from yesterday. General: Critically ill looking male lying on bed, on vent, tracheostomy and sedated.Marland Kitchen Heart:RRR, s1s2 nl Lungs: Coarse breath sound bilateral Abdomen:soft, Non-tender, non-distended Extremities: Pitting edema +, anasarca+, some improvement. Dialysis Access: Right femoral HD catheter site clean.  Tracer Gutridge Prasad Sura Canul 09/28/2020,8:33 AM  LOS: 26 days

## 2020-09-28 NOTE — Progress Notes (Signed)
ANTICOAGULATION CONSULT NOTE  Pharmacy Consult for bivalirudin Indication: ECMO + bilateral DVTs + SAH 12/26   Recent Labs    09/26/20 0403 09/26/20 0405 09/27/20 0339 09/27/20 0351 09/27/20 1701 09/27/20 1940 09/27/20 1942 09/28/20 0301 09/28/20 0302 09/28/20 0403  HGB 8.7*   < > 9.1*   < > 8.0*   < > 7.8*  --  11.4*  9.5* 8.5*  HCT 27.5*   < > 27.3*   < > 24.6*   < > 24.6*  --  34.3*  28.0* 25.0*  PLT 131*   < > 105*  --  98*  --   --   --  80*  --   APTT 53*   < > 47*  --  55*  --   --  54*  --   --   LABPROT 16.3*  --  15.7*  --   --   --   --   --   --   --   INR 1.4*  --  1.3*  --   --   --   --   --   --   --   CREATININE 0.92   < > 0.77  --  0.73  --   --   --  0.63  --    < > = values in this interval not displayed.    Estimated Creatinine Clearance: 120 mL/min (by C-G formula based on SCr of 0.63 mg/dL).   Assessment: 67 yom unvaccinated presenting with severe COVID ARDs, intubated on 12/6, continued to have ventilation issues despite NMB and proning - started on VV ECMO on 12/8. Pt noted to have bilateral DVTs 12/8. Bivalirudin started for ECMO anticoagulation. Head CT 12/26 with new small SAH, repeat 12/27 stable without expansion - bivalirudin aPTT goal lowered.  12/30 AM:  APTT just above goal Hgb up some after blood yesterday  Goal of Therapy:  aPTT 40-50 seconds (new 12/26)  Monitor platelets by anticoagulation protocol: Yes   Plan:  Decrease bivalirudin to 0.03 mg/kg/hr  (weight 103kg) Check q12h aPTT and CBC  Monitor for S/Sx bleeding  Abran Duke, PharmD, BCPS Clinical Pharmacist Phone: [phone]

## 2020-09-28 NOTE — Progress Notes (Signed)
Patient ID: Donald Rowe, male   DOB: 12/20/1960, 59 y.o.   MRN: [id number]    Advanced Heart Failure Rounding Note   Subjective:    12/8 Cannulated for VV ECMO - 32 FR  RIJ Crescent 12/9 Extubated/Reintubated for altered mental status 12/13 Circuit change for elevated LDH 12/15 PLEX started 12/16 Tracheostomy 12/23 head CT (normal) for change in mental status.  12/26 Pan CT for drop in hgb. Small SAH. Severe lung disease 12/27 CVVH begun  He received another unit PRBCs overnight without overt bleeding evidence, hgb ?11.4 today.   Still no purposeful responses but moves arms up and down.  He is currently sedated on propofol and Fentanyl.   SBP around 170, nicardipine gtt currently off.  Getting clonidine, amlodipine, and metoprolol.  CVVH ongoing, pulling 50 cc/hr net negative currently with weight down 3 lbs.  CXR unchanged today. Had to go back up on Sweep yesterday.   Had PLEX 4/4 completed 12/22.   ECMO   Speed 3200 Flow 4.78 L Sweep 9.5 dP 20 pVen -60  ABG: 7.33/55/68/91% Hgb 11.4 LDH 434 -> 544 -> 419 -> 551 -> 579 -> 590 -> 438 -> 518 -> 488 Lactic acid 0.8 PTT 54  Vent 50%   Objective:   Weight Range:  Vital Signs:   Temp:  [97.6 F (36.4 C)-98.8 F (37.1 C)] 98.4 F (36.9 C) (12/30 0400) Pulse Rate:  [97-127] 120 (12/30 0700) Resp:  [7-30] 21 (12/30 0700) BP: (94-189)/(50-81) 139/81 (12/30 0700) SpO2:  [89 %-100 %] 96 % (12/30 0700) Arterial Line BP: (103-188)/(42-74) 152/58 (12/30 0600) FiO2 (%):  [50 %] 50 % (12/30 0403) Weight:  [105.7 kg] 105.7 kg (12/30 0500) Last BM Date: 09/27/20  Weight change: Filed Weights   09/26/20 0500 09/27/20 0444 09/28/20 0500  Weight: 110.4 kg 107.1 kg 105.7 kg    Intake/Output:   Intake/Output Summary (Last 24 hours) at 09/28/2020 0739 Last data filed at 09/28/2020 0700 Gross per 24 hour  Intake 4524 ml  Output 6570 ml  Net -2046 ml     Physical Exam: General: Sedated on vent Neck: Tracheostomy.   No JVD, no thyromegaly or thyroid nodule.  Lungs: Decreased at bases.  CV: Nondisplaced PMI.  Heart mildly tachy, regular S1/S2, no S3/S4, no murmur.  1+ ankle edema.  Abdomen: Soft, nontender, no hepatosplenomegaly, no distention.  Skin: Intact without lesions or rashes.  Neurologic: Moves arms, no purposeful movement. Extremities: No clubbing or cyanosis.  HEENT: Normal.     Telemetry: sinus 110s Personally reviewed    Labs: Basic Metabolic Panel: Recent Labs  Lab 09/26/20 0403 09/26/20 0405 09/26/20 0643 09/26/20 0644 09/26/20 1548 09/26/20 1557 09/27/20 0339 09/27/20 0351 09/27/20 1644 09/27/20 1701 09/27/20 1940 09/28/20 0301 09/28/20 0302 09/28/20 0403  NA 142   < > 141   < > 140   < > 140   < > 141 140 141  --  141  142 142  K 4.1   < > 4.2   < > 5.0   < > 4.3   < > 4.9 5.2* 5.0  --  4.7  4.6 4.6  CL 109  --  108  --  108  --  107  --   --  107  --   --  108  --   CO2 26  --  25  --  25  --  26  --   --  25  --   --  28  --  GLUCOSE 96  --  85  --  151*  --  111*  --   --  134*  --   --  108*  --   BUN 70*  --  68*  --  57*  --  50*  --   --  51*  --   --  42*  --   CREATININE 0.92  --  0.95  --  0.83  --  0.77  --   --  0.73  --   --  0.63  --   CALCIUM 7.8*  --  7.7*  --  7.8*  --  7.9*  --   --  7.8*  --   --  7.9*  --   MG 3.5*  --   --   --   --   --  3.1*  --   --   --   --  3.0*  --   --   PHOS 2.9  --   --   --  4.2  --  3.4  --   --  3.8  --   --  3.1  --    < > = values in this interval not displayed.    Liver Function Tests: Recent Labs  Lab 09/23/20 0415 09/24/20 0349 09/25/20 0400 09/25/20 1600 09/26/20 0403 09/26/20 1548 09/27/20 0339 09/27/20 1701 09/28/20 0302  AST 67* 50* 44*  --  49*  --  49*  --   --   ALT 86* 71* 59*  --  39  --  50*  --   --   ALKPHOS 140* 137* 113  --  112  --  138*  --   --   BILITOT 1.7* 1.9* 1.7*  --  1.7*  --  1.4*  --   --   PROT 4.9* 4.8* 4.5*  --  5.1*  --  5.4*  --   --   ALBUMIN 2.8* 2.8* 2.5*   <  > 2.7*  2.6* 2.8* 2.8*  2.8* 2.8* 3.0*   < > = values in this interval not displayed.   No results for input(s): LIPASE, AMYLASE in the last 168 hours. Recent Labs  Lab 09/24/20 1128  AMMONIA 36*    CBC: Recent Labs  Lab 09/26/20 0403 09/26/20 0405 09/26/20 1548 09/26/20 1557 09/27/20 0339 09/27/20 0351 09/27/20 1701 09/27/20 1940 09/27/20 1942 09/28/20 0302 09/28/20 0403  WBC 9.0  --  7.7  --  7.9  --  6.0  --   --  5.7  --   HGB 8.7*   < > 8.3*   < > 9.1*   < > 8.0* 7.5* 7.8* 11.4*  9.5* 8.5*  HCT 27.5*   < > 25.7*   < > 27.3*   < > 24.6* 22.0* 24.6* 34.3*  28.0* 25.0*  MCV 95.5  --  95.5  --  95.5  --  96.1  --   --  95.0  --   PLT 131*  --  108*  --  105*  --  98*  --   --  80*  --    < > = values in this interval not displayed.    Cardiac Enzymes: No results for input(s): CKTOTAL, CKMB, CKMBINDEX, TROPONINI in the last 168 hours.  BNP: BNP (last 3 results) No results for input(s): BNP in the last 8760 hours.  ProBNP (last 3 results) No results for input(s): PROBNP in the last 8760 hours.  Other results:  Imaging: DG CHEST PORT 1 VIEW  Result Date: 09/28/2020 CLINICAL DATA:  59 year old male COVID-79. EXAM: PORTABLE CHEST 1 VIEW COMPARISON:  Portable chest 09/27/2020 and earlier. FINDINGS: Portable AP semi upright view at 0527 hours. Stable ECMO cannula. Stable tracheostomy tube, left PICC line, visible enteric feeding tube. Mildly lower lung volumes. Mediastinal contours remain within normal limits. No pneumothorax. Patchy and confluent bilateral pulmonary opacity. Improved perihilar ventilation since 09/26/2020. No definite pleural effusion. Paucity of bowel gas in the upper abdomen. IMPRESSION: 1.  Stable lines and tubes. 2. Severe bilateral pneumonia with mildly improved ventilation in both lungs since 09/26/2020. Electronically Signed   By: Genevie Ann M.D.   On: 09/28/2020 07:21   DG CHEST PORT 1 VIEW  Result Date: 09/27/2020 CLINICAL DATA:  Recent  COVID-19 positive. Respiratory failure. ECMO therapy EXAM: PORTABLE CHEST 1 VIEW COMPARISON:  September 26, 2020 FINDINGS: Tracheostomy catheter tip is 6.3 cm above the carina. ECMO catheter tip is in the inferior vena cava. Peripherally inserted left-sided central catheter tip in superior vena cava. Enteric tube tip is below the diaphragm. No pneumothorax. Widespread airspace opacity persists without appreciable change. Heart size and pulmonary vascularity are normal. No adenopathy. No bone lesions. IMPRESSION: Tube and catheter positions as described without pneumothorax. Widespread airspace opacity persists without appreciable change. Stable cardiac silhouette. Electronically Signed   By: Lowella Grip III M.D.   On: 09/27/2020 08:00   DG Abd Portable 1V  Result Date: 09/27/2020 CLINICAL DATA:  Ileus EXAM: PORTABLE ABDOMEN - 1 VIEW COMPARISON:  September 06, 2020 FINDINGS: Feeding tube in the small bowel at the level of ligament of Treitz. ECMO cannula in place projects over the T12 level entering via superior approach, off the field of the radiograph. Stable position compared to prior CT evaluation. Diffuse pulmonary opacities with similar appearance, partially imaged. Scattered gas-filled loops of small bowel without signs of significant dilation. Gas and stool throughout the colon. Small amount of gas in the mid rectum. No acute skeletal process to the extent evaluated. IMPRESSION: 1. Feeding tube in the small bowel at the level of the ligament of Treitz. 2. Scattered gas-filled loops of small bowel without signs of significant dilation, may reflect mild ileus. 3. ECMO cannula passing below the RIGHT hemidiaphragm tip at the upper margin of T12 as on prior CT. 4. Diffuse interstitial and airspace opacities at the lung bases as on the prior study. Electronically Signed   By: Zetta Bills M.D.   On: 09/27/2020 11:22     Medications:     Scheduled Medications: . amLODipine  10 mg Per Tube QHS  .  B-complex with vitamin C  1 tablet Per Tube Daily  . chlorhexidine gluconate (MEDLINE KIT)  15 mL Mouth Rinse BID  . Chlorhexidine Gluconate Cloth  6 each Topical Daily  . clonazePAM  2 mg Per Tube Q8H  . cloNIDine  0.2 mg Per Tube TID  . docusate  100 mg Per Tube BID  . feeding supplement (PROSource TF)  45 mL Per Tube QID  . fiber  1 packet Per Tube BID  . insulin aspart  2-6 Units Subcutaneous Q4H  . insulin aspart  3 Units Subcutaneous Q4H  . insulin detemir  13 Units Subcutaneous BID  . levothyroxine  50 mcg Per Tube Q0600  . mouth rinse  15 mL Mouth Rinse 10 times per day  . methylPREDNISolone (SOLU-MEDROL) injection  20 mg Intravenous Q24H  . metoprolol tartrate  25 mg Per  Tube Q12H  . nystatin  5 mL Per Tube QID  . oxyCODONE  10 mg Per Tube Q6H  . pantoprazole sodium  40 mg Per Tube BID  . polyethylene glycol  17 g Per Tube BID  . pravastatin  40 mg Per Tube q1800  . QUEtiapine  150 mg Per Tube TID  . sodium chloride flush  10-40 mL Intracatheter Q12H    Infusions: .  prismasol BGK 4/2.5 500 mL/hr at 09/28/20 0116  .  prismasol BGK 4/2.5 300 mL/hr at 09/28/20 0115  . sodium chloride 10 mL/hr at 09/20/20 1900  . albumin human    . albumin human Stopped (09/27/20 2009)  . bivalirudin (ANGIOMAX) infusion 0.5 mg/mL (Non-ACS indications) 0.03 mg/kg/hr (09/28/20 0601)  . ceFEPime (MAXIPIME) IV Stopped (09/28/20 0536)  . feeding supplement (PIVOT 1.5 CAL) 70 mL/hr at 09/28/20 0600  . fentaNYL infusion INTRAVENOUS 275 mcg/hr (09/28/20 0601)  . niCARDipine    . norepinephrine (LEVOPHED) Adult infusion Stopped (09/26/20 2335)  . prismasol BGK 4/2.5 1,500 mL/hr at 09/28/20 0444  . propofol (DIPRIVAN) infusion 10 mcg/kg/min (09/28/20 0601)  . vancomycin Stopped (09/27/20 2142)    PRN Medications: acetaminophen (TYLENOL) oral liquid 160 mg/5 mL, albumin human, albuterol, diphenhydrAMINE, fentaNYL, Gerhardt's butt cream, haloperidol lactate, heparin, labetalol, lip balm,  LORazepam, ondansetron **OR** ondansetron (ZOFRAN) IV, phenylephrine, sodium chloride flush   Assessment/Plan:   1. Acute hypoxic respiratory failure/ARDS in setting of COVID-19 PNA - Cannulated for VV ECMO on 12/8 after failing intubation x 2 days - has complete remedisivir, baricitinib, Solumedrol.  - s/p trach 12/16 - CT chest 12/26 with severe lung disease and pneumomediastinum. Lungs not recovering well.  - Restarted vanc/cefepime 12/26 - Circuit changed 12/13 for elevated LDH and lower flows. PLEX started 12/15. S/p PLEX #4/4 on 12/21. LDH stable. Circuit parameters stable.   - Sweep back up to 9.5.   - CVVH started 12/27 with intractable volume overload, CXR stable today. Weight down another 3 lbs. Have had trouble with chugging in circuit, but seems stable when we pull net negative 50 cc/hr.  Continue.  - PTT goal turned down to 40-50 due to [facility]. Improved. Circuit looks ok. PTT 54 today. Discussed dosing with PharmD personally. - Mental status remains a major issue. No purposeful response but moves arms.  Suspect delirium, continue Fentanyl but will stop Propofol.   2. Bilateral DVT - remains on bivalirudin - Dosing d/w PharmD - no change  3. DM2 - Insulin adjusted. CBGs improved   4. Obesity -Body mass index is 33.73 kg/m.  5. F/E/N - Stable TFs  6. HTN - Remains very labile  - Now off nicardipine and getting amlodipine, clonidine, and metoprolol.   7. Rectal bleeding - follow closely. Rectal tube removed  8. AKI - Now on CVVH primarily for intractable volume overload, see above.   9. Acute encephalopathy - remains agitated  - head CT ok 09/07/20 and 12/21 - head CT 12/26. Small SAH.  Repeat CT on 12/27 with unchanged small SAH.   10. Small SAH - on CT 12/26 - repeat CT 12/27 unchanged small SAH.  - PTT goal reduced 40-50 - avoid severe HTN as best as possible  Plan discussed on multi-discplinary ECMO rounds with CCM, ECMO coordinator/specialist,  PharmDs and RNs.  CRITICAL CARE Performed by: Loralie Champagne  Total critical care time: 45 minutes  Critical care time was exclusive of separately billable procedures and treating other patients.  Critical care was necessary to treat or prevent  imminent or life-threatening deterioration.  Critical care was time spent personally by me (independent of midlevel providers or residents) on the following activities: development of treatment plan with patient and/or surrogate as well as nursing, discussions with consultants, evaluation of patient's response to treatment, examination of patient, obtaining history from patient or surrogate, ordering and performing treatments and interventions, ordering and review of laboratory studies, ordering and review of radiographic studies, pulse oximetry and re-evaluation of patient's condition.    Length of Stay: 86   Loralie Champagne MD 09/28/2020, 7:39 AM  Advanced Heart Failure Team Pager [phone] (M-F; [place])  Please contact [facility] for night-coverage after hours (4p -7a ) and weekends on [email or web address]

## 2020-09-29 ENCOUNTER — Inpatient Hospital Stay (HOSPITAL_COMMUNITY): Payer: HRSA Program

## 2020-09-29 DIAGNOSIS — J80 Acute respiratory distress syndrome: Secondary | ICD-10-CM | POA: Diagnosis not present

## 2020-09-29 DIAGNOSIS — J9601 Acute respiratory failure with hypoxia: Secondary | ICD-10-CM | POA: Diagnosis not present

## 2020-09-29 DIAGNOSIS — E870 Hyperosmolality and hypernatremia: Secondary | ICD-10-CM | POA: Diagnosis not present

## 2020-09-29 DIAGNOSIS — U071 COVID-19: Secondary | ICD-10-CM | POA: Diagnosis not present

## 2020-09-29 DIAGNOSIS — Z515 Encounter for palliative care: Secondary | ICD-10-CM | POA: Diagnosis not present

## 2020-09-29 DIAGNOSIS — Z7189 Other specified counseling: Secondary | ICD-10-CM | POA: Diagnosis not present

## 2020-09-29 LAB — POCT I-STAT 7, (LYTES, BLD GAS, ICA,H+H)
Acid-Base Excess: 1 mmol/L (ref 0.0–2.0)
Acid-Base Excess: 1 mmol/L (ref 0.0–2.0)
Acid-Base Excess: 2 mmol/L (ref 0.0–2.0)
Acid-Base Excess: 2 mmol/L (ref 0.0–2.0)
Acid-Base Excess: 4 mmol/L — ABNORMAL HIGH (ref 0.0–2.0)
Bicarbonate: 26 mmol/L (ref 20.0–28.0)
Bicarbonate: 27.3 mmol/L (ref 20.0–28.0)
Bicarbonate: 27.6 mmol/L (ref 20.0–28.0)
Bicarbonate: 27.9 mmol/L (ref 20.0–28.0)
Bicarbonate: 29.1 mmol/L — ABNORMAL HIGH (ref 20.0–28.0)
Calcium, Ion: 1.18 mmol/L (ref 1.15–1.40)
Calcium, Ion: 1.22 mmol/L (ref 1.15–1.40)
Calcium, Ion: 1.24 mmol/L (ref 1.15–1.40)
Calcium, Ion: 1.24 mmol/L (ref 1.15–1.40)
Calcium, Ion: 1.27 mmol/L (ref 1.15–1.40)
HCT: 26 % — ABNORMAL LOW (ref 39.0–52.0)
HCT: 26 % — ABNORMAL LOW (ref 39.0–52.0)
HCT: 28 % — ABNORMAL LOW (ref 39.0–52.0)
HCT: 28 % — ABNORMAL LOW (ref 39.0–52.0)
HCT: 33 % — ABNORMAL LOW (ref 39.0–52.0)
Hemoglobin: 11.2 g/dL — ABNORMAL LOW (ref 13.0–17.0)
Hemoglobin: 8.8 g/dL — ABNORMAL LOW (ref 13.0–17.0)
Hemoglobin: 8.8 g/dL — ABNORMAL LOW (ref 13.0–17.0)
Hemoglobin: 9.5 g/dL — ABNORMAL LOW (ref 13.0–17.0)
Hemoglobin: 9.5 g/dL — ABNORMAL LOW (ref 13.0–17.0)
O2 Saturation: 95 %
O2 Saturation: 95 %
O2 Saturation: 96 %
O2 Saturation: 96 %
O2 Saturation: 96 %
Patient temperature: 36.5
Patient temperature: 36.6
Patient temperature: 37
Patient temperature: 37.1
Patient temperature: 37.2
Potassium: 4.6 mmol/L (ref 3.5–5.1)
Potassium: 4.7 mmol/L (ref 3.5–5.1)
Potassium: 4.7 mmol/L (ref 3.5–5.1)
Potassium: 5.1 mmol/L (ref 3.5–5.1)
Potassium: 5.1 mmol/L (ref 3.5–5.1)
Sodium: 139 mmol/L (ref 135–145)
Sodium: 139 mmol/L (ref 135–145)
Sodium: 140 mmol/L (ref 135–145)
Sodium: 141 mmol/L (ref 135–145)
Sodium: 141 mmol/L (ref 135–145)
TCO2: 27 mmol/L (ref 22–32)
TCO2: 29 mmol/L (ref 22–32)
TCO2: 29 mmol/L (ref 22–32)
TCO2: 29 mmol/L (ref 22–32)
TCO2: 31 mmol/L (ref 22–32)
pCO2 arterial: 43.1 mmHg (ref 32.0–48.0)
pCO2 arterial: 47 mmHg (ref 32.0–48.0)
pCO2 arterial: 48.4 mmHg — ABNORMAL HIGH (ref 32.0–48.0)
pCO2 arterial: 48.5 mmHg — ABNORMAL HIGH (ref 32.0–48.0)
pCO2 arterial: 49.6 mmHg — ABNORMAL HIGH (ref 32.0–48.0)
pH, Arterial: 7.356 (ref 7.350–7.450)
pH, Arterial: 7.36 (ref 7.350–7.450)
pH, Arterial: 7.363 (ref 7.350–7.450)
pH, Arterial: 7.389 (ref 7.350–7.450)
pH, Arterial: 7.398 (ref 7.350–7.450)
pO2, Arterial: 79 mmHg — ABNORMAL LOW (ref 83.0–108.0)
pO2, Arterial: 80 mmHg — ABNORMAL LOW (ref 83.0–108.0)
pO2, Arterial: 80 mmHg — ABNORMAL LOW (ref 83.0–108.0)
pO2, Arterial: 83 mmHg (ref 83.0–108.0)
pO2, Arterial: 88 mmHg (ref 83.0–108.0)

## 2020-09-29 LAB — RENAL FUNCTION PANEL
Albumin: 2.8 g/dL — ABNORMAL LOW (ref 3.5–5.0)
Albumin: 3 g/dL — ABNORMAL LOW (ref 3.5–5.0)
Anion gap: 8 (ref 5–15)
Anion gap: 8 (ref 5–15)
BUN: 40 mg/dL — ABNORMAL HIGH (ref 6–20)
BUN: 46 mg/dL — ABNORMAL HIGH (ref 6–20)
CO2: 26 mmol/L (ref 22–32)
CO2: 25 mmol/L (ref 22–32)
Calcium: 8.4 mg/dL — ABNORMAL LOW (ref 8.9–10.3)
Calcium: 8.3 mg/dL — ABNORMAL LOW (ref 8.9–10.3)
Chloride: 106 mmol/L (ref 98–111)
Chloride: 105 mmol/L (ref 98–111)
Creatinine, Ser: 0.65 mg/dL (ref 0.61–1.24)
Creatinine, Ser: 0.79 mg/dL (ref 0.61–1.24)
GFR, Estimated: >60 mL/min (ref >60)
GFR, Estimated: >60 mL/min (ref >60)
Glucose, Bld: 143 mg/dL — ABNORMAL HIGH (ref 70–99)
Glucose, Bld: 134 mg/dL — ABNORMAL HIGH (ref 70–99)
Phosphorus: 2.4 mg/dL — ABNORMAL LOW (ref 2.5–4.6)
Phosphorus: 3.7 mg/dL (ref 2.5–4.6)
Potassium: 4.5 mmol/L (ref 3.5–5.1)
Potassium: 5 mmol/L (ref 3.5–5.1)
Sodium: 140 mmol/L (ref 135–145)
Sodium: 138 mmol/L (ref 135–145)

## 2020-09-29 LAB — CULTURE, BLOOD (ROUTINE X 2)
Culture: NO GROWTH
Culture: NO GROWTH
Special Requests: ADEQUATE

## 2020-09-29 LAB — LACTATE DEHYDROGENASE: LDH: 514 U/L — ABNORMAL HIGH (ref 98–192)

## 2020-09-29 LAB — CBC
HCT: 30.3 % — ABNORMAL LOW (ref 39.0–52.0)
HCT: 29.3 % — ABNORMAL LOW (ref 39.0–52.0)
Hemoglobin: 9.5 g/dL — ABNORMAL LOW (ref 13.0–17.0)
Hemoglobin: 9.3 g/dL — ABNORMAL LOW (ref 13.0–17.0)
MCH: 30.3 pg (ref 26.0–34.0)
MCH: 30.3 pg (ref 26.0–34.0)
MCHC: 31.4 g/dL (ref 30.0–36.0)
MCHC: 31.7 g/dL (ref 30.0–36.0)
MCV: 96.5 fL (ref 80.0–100.0)
MCV: 95.4 fL (ref 80.0–100.0)
Platelets: 105 10*3/uL — ABNORMAL LOW (ref 150–400)
Platelets: 122 10*3/uL — ABNORMAL LOW (ref 150–400)
RBC: 3.14 MIL/uL — ABNORMAL LOW (ref 4.22–5.81)
RBC: 3.07 MIL/uL — ABNORMAL LOW (ref 4.22–5.81)
RDW: 17.2 % — ABNORMAL HIGH (ref 11.5–15.5)
RDW: 17 % — ABNORMAL HIGH (ref 11.5–15.5)
WBC: 9.2 10*3/uL (ref 4.0–10.5)
WBC: 10.2 10*3/uL (ref 4.0–10.5)
nRBC: 0.3 % — ABNORMAL HIGH (ref 0.0–0.2)
nRBC: 0.4 % — ABNORMAL HIGH (ref 0.0–0.2)

## 2020-09-29 LAB — GLUCOSE, CAPILLARY
Glucose-Capillary: 123 mg/dL — ABNORMAL HIGH (ref 70–99)
Glucose-Capillary: 133 mg/dL — ABNORMAL HIGH (ref 70–99)
Glucose-Capillary: 139 mg/dL — ABNORMAL HIGH (ref 70–99)
Glucose-Capillary: 141 mg/dL — ABNORMAL HIGH (ref 70–99)
Glucose-Capillary: 142 mg/dL — ABNORMAL HIGH (ref 70–99)
Glucose-Capillary: 144 mg/dL — ABNORMAL HIGH (ref 70–99)
Glucose-Capillary: 158 mg/dL — ABNORMAL HIGH (ref 70–99)

## 2020-09-29 LAB — LACTIC ACID, PLASMA
Lactic Acid, Venous: 1.1 mmol/L (ref 0.5–1.9)
Lactic Acid, Venous: 1 mmol/L (ref 0.5–1.9)

## 2020-09-29 LAB — FIBRINOGEN: Fibrinogen: 537 mg/dL — ABNORMAL HIGH (ref 210–475)

## 2020-09-29 LAB — MAGNESIUM: Magnesium: 3.1 mg/dL — ABNORMAL HIGH (ref 1.7–2.4)

## 2020-09-29 LAB — TRIGLYCERIDES: Triglycerides: 55 mg/dL (ref <150)

## 2020-09-29 LAB — APTT
aPTT: 43 seconds — ABNORMAL HIGH (ref 24–36)
aPTT: 42 seconds — ABNORMAL HIGH (ref 24–36)

## 2020-09-29 MED ORDER — METOPROLOL TARTRATE 25 MG/10 ML ORAL SUSPENSION
50.0000 mg | Freq: Two times a day (BID) | ORAL | Status: DC
  Administered 2020-09-29 – 2020-10-08 (×18): 50 mg
  Administered 2020-10-09: 25 mg
  Administered 2020-10-09: 50 mg
  Administered 2020-10-09: 25 mg
  Administered 2020-10-10 – 2020-10-12 (×6): 50 mg
  Filled 2020-09-29 (×27): qty 20

## 2020-09-29 MED ORDER — FUROSEMIDE 10 MG/ML IJ SOLN
120.0000 mg | Freq: Once | INTRAVENOUS | Status: AC
  Administered 2020-09-29: 120 mg via INTRAVENOUS
  Filled 2020-09-29: qty 10

## 2020-09-29 MED ORDER — WHITE PETROLATUM EX OINT
TOPICAL_OINTMENT | CUTANEOUS | Status: AC
  Administered 2020-09-29: 0.2
  Filled 2020-09-29: qty 28.35

## 2020-09-29 MED ORDER — POTASSIUM PHOSPHATES 15 MMOLE/5ML IV SOLN
20.0000 mmol | Freq: Once | INTRAVENOUS | Status: AC
  Administered 2020-09-29: 20 mmol via INTRAVENOUS
  Filled 2020-09-29: qty 6.67

## 2020-09-29 NOTE — Progress Notes (Signed)
ANTICOAGULATION CONSULT NOTE  Pharmacy Consult for bivalirudin Indication: ECMO + bilateral DVTs + SAH 12/26   Recent Labs    09/27/20 0339 09/27/20 0351 09/28/20 0302 09/28/20 0403 09/28/20 1600 09/28/20 1700 09/28/20 2000 09/28/20 2014 09/29/20 0315 09/29/20 0316 09/29/20 0319 09/29/20 0806 09/29/20 1610 09/29/20 1617  HGB 9.1*   < > 11.4*  9.5*   < >  --   --  9.1*   < >  --  9.5*   < > 8.8* 9.3* 9.5*  HCT 27.3*   < > 34.3*  28.0*   < >  --   --  28.0*   < >  --  30.3*   < > 26.0* 29.3* 28.0*  PLT 105*   < > 80*  --   --   --  104*  --   --  105*  --   --  122*  --   APTT 47*   < >  --   --   --  43*  --   --  43*  --   --   --  42*  --   LABPROT 15.7*  --   --   --   --   --   --   --   --   --   --   --   --   --   INR 1.3*  --   --   --   --   --   --   --   --   --   --   --   --   --   CREATININE 0.77   < > 0.63  --  0.64  --   --   --   --  0.65  --   --   --   --    < > = values in this interval not displayed.    Estimated Creatinine Clearance: 119.3 mL/min (by C-G formula based on SCr of 0.65 mg/dL).   Assessment: 84 yom unvaccinated presenting with severe COVID ARDs, intubated on 12/6, continued to have ventilation issues despite NMB and proning - started on VV ECMO on 12/8. Pt noted to have bilateral DVTs 12/8. Bivalirudin started for ECMO anticoagulation. Head CT 12/26 with new small SAH, repeat 12/27 stable without expansion - bivalirudin aPTT goal lowered.  aPTT tonight remains therapeutic, CBC is stable.  Goal of Therapy:  aPTT 40-50 seconds (new 12/26)  Monitor platelets by anticoagulation protocol: Yes   Plan:  Continue bivalirudin at 0.03 mg/kg/hr (weight 103kg) Check q12h aPTT and CBC  Monitor for S/Sx bleeding  Fredonia Highland, PharmD, BCPS, [facility] Clinical Pharmacist [phone] Please check AMION for all [facility] Pharmacy numbers 09/29/2020

## 2020-09-29 NOTE — Progress Notes (Signed)
Palliative:  HPI:59 y.o.malewith a pertinent history ofhypothyroidism on levothyroxine and hyperlipidemia who was diagnosed with Covid 1 week ago and is not vaccinated. With EMS he was seen to bedesaturatingto 78% on room air. He states he started having symptoms on Friday and tested positive on Saturday.Had initially elected to be DNAR without intubation though later changed this and got intubated on 12/6 in the setting of worsening respiratory failure with hypoxia. CTA chest w/diffuse bilateral infiltrates.On 12/8 was cannulated for VV ECMO.Had been extubated on 12/9 then emergently reintubated in early eveningdue to clinical instability. Palliative care was asked to get involved in the setting of VV ECMO to set goals and expectations withJ.R.'sfamily.He continues on VV ECMO and ventilator support.ECMO circuit change 09/11/20.PLEX began 12/15.  I met today at [name]'s bedside again this morning with ECMO rounds. He has successfully come off IV sedation. He continues with severe delirium and not following commands. Continues on Cardene infusion and increasing metoprolol to better control blood pressure. Ongoing efforts to wean ECMO/vent support.   I called and spoke with [name]. Discussed the above. Reviewed labs and exam. For now we wait and see if he shows any signs of improvement in delirium as we continue attempts to wean ECMO support. [name] asks good questions as always.   All questions/concerns addressed. Emotional support provided.   Exam: Sedated on ventvia trach. VV ECMO. Cortrak in place.No distress.50% FiO2 and tolerating ventwith no dyssynchrony.Extremities warm to touch. Toes cool.Abdsoft.  Plan: - Continue aggressive care with hopes of improvement.  25 min  Vinie Sill, NP Palliative Medicine Team Pager [phone] (Please see [email or web address] for schedule) Team Phone [phone]    Greater than 50%  of this time was spent counseling and coordinating care  related to the above assessment and plan

## 2020-09-29 NOTE — Progress Notes (Addendum)
Patient ID: Donald Rowe, male   DOB: 1961/03/29, 59 y.o.   MRN: [id number]    Advanced Heart Failure Rounding Note   Subjective:    12/8 Cannulated for VV ECMO - 32 FR  RIJ [place] 12/9 Extubated/Reintubated for altered mental status 12/13 Circuit change for elevated LDH 12/15 PLEX started 12/16 Tracheostomy 12/23 head CT (normal) for change in mental status.  12/26 Pan CT for drop in hgb. Small SAH. Severe lung disease 12/27 CVVH begun  No PRBCs last night, hgb 9.5 today.    Still no purposeful responses but moves arms up and down.  Off propofol, only getting Fentanyl.  SBP 160s on nicardipine gtt 8.  Getting clonidine, amlodipine, and metoprolol.  CVVH ongoing, pulling 50 cc/hr net negative currently I/Os negatve.  CXR with slow improvement.  Had PLEX 4/4 completed 12/22.   ECMO   Speed 3200 Flow 3.9 L Sweep 9 dP 20 pVen -60  ABG: 7.36/48/80/95% Hgb 9.5 LDH 434 -> 544 -> 419 -> 551 -> 579 -> 590 -> 438 -> 518 -> 488 -> 514 Lactic acid 1.1 PTT 43  Vent 50%   Objective:   Weight Range:  Vital Signs:   Temp:  [98.2 F (36.8 C)-98.4 F (36.9 C)] 98.4 F (36.9 C) (12/31 0400) Pulse Rate:  [41-129] 118 (12/31 0700) Resp:  [15-70] 44 (12/31 0700) BP: (132-187)/(64-94) 161/75 (12/31 0700) SpO2:  [93 %-100 %] 95 % (12/31 0700) Arterial Line BP: (139-185)/(55-78) 165/63 (12/31 0700) FiO2 (%):  [50 %] 50 % (12/31 0400) Weight:  [106 kg] 106 kg (12/31 0422) Last BM Date: 09/28/20  Weight change: Filed Weights   09/27/20 0444 09/28/20 0500 09/29/20 0422  Weight: 107.1 kg 105.7 kg 106 kg    Intake/Output:   Intake/Output Summary (Last 24 hours) at 09/29/2020 0759 Last data filed at 09/29/2020 0700 Gross per 24 hour  Intake 2996.25 ml  Output 5561 ml  Net -2564.75 ml     Physical Exam: General: NAD Neck: Tracheostomy. No JVD, no thyromegaly or thyroid nodule.  Lungs: Decreased at bases.  CV: Nondisplaced PMI.  Heart mildly tachy, regular S1/S2, no  S3/S4, no murmur.  No peripheral edema.   Abdomen: Soft, nontender, no hepatosplenomegaly, no distention.  Skin: Intact without lesions or rashes.  Neurologic: Moves arms but nothing purposeful. Extremities: No clubbing or cyanosis.  HEENT: Normal.    Telemetry: sinus 110s Personally reviewed    Labs: Basic Metabolic Panel: Recent Labs  Lab 09/26/20 0403 09/26/20 0405 09/27/20 0339 09/27/20 0351 09/27/20 1701 09/27/20 1940 09/28/20 0301 09/28/20 0302 09/28/20 0403 09/28/20 1600 09/28/20 2014 09/29/20 0033 09/29/20 0315 09/29/20 0316 09/29/20 0319  NA 142   < > 140   < > 140   < >  --  141  142   < > 139 140 141  --  140 141  K 4.1   < > 4.3   < > 5.2*   < >  --  4.7  4.6   < > 5.2* 4.9 4.7  --  4.5 4.6  CL 109   < > 107  --  107  --   --  108  --  107  --   --   --  106  --   CO2 26   < > 26  --  25  --   --  28  --  25  --   --   --  26  --   GLUCOSE 96   < >  111*  --  134*  --   --  108*  --  135*  --   --   --  143*  --   BUN 70*   < > 50*  --  51*  --   --  42*  --  41*  --   --   --  40*  --   CREATININE 0.92   < > 0.77  --  0.73  --   --  0.63  --  0.64  --   --   --  0.65  --   CALCIUM 7.8*   < > 7.9*  --  7.8*  --   --  7.9*  --  8.3*  --   --   --  8.4*  --   MG 3.5*  --  3.1*  --   --   --  3.0*  --   --   --   --   --  3.1*  --   --   PHOS 2.9   < > 3.4  --  3.8  --   --  3.1  --  3.0  --   --   --  2.4*  --    < > = values in this interval not displayed.    Liver Function Tests: Recent Labs  Lab 09/23/20 0415 09/24/20 0349 09/25/20 0400 09/25/20 1600 09/26/20 0403 09/26/20 1548 09/27/20 0339 09/27/20 1701 09/28/20 0302 09/28/20 1600 09/29/20 0316  AST 67* 50* 44*  --  49*  --  49*  --   --   --   --   ALT 86* 71* 59*  --  39  --  50*  --   --   --   --   ALKPHOS 140* 137* 113  --  112  --  138*  --   --   --   --   BILITOT 1.7* 1.9* 1.7*  --  1.7*  --  1.4*  --   --   --   --   PROT 4.9* 4.8* 4.5*  --  5.1*  --  5.4*  --   --   --   --    ALBUMIN 2.8* 2.8* 2.5*   < > 2.7*  2.6*   < > 2.8*  2.8* 2.8* 3.0* 3.0* 2.8*   < > = values in this interval not displayed.   No results for input(s): LIPASE, AMYLASE in the last 168 hours. Recent Labs  Lab 09/24/20 1128  AMMONIA 36*    CBC: Recent Labs  Lab 09/27/20 0339 09/27/20 0351 09/27/20 1701 09/27/20 1940 09/28/20 0302 09/28/20 0403 09/28/20 2000 09/28/20 2014 09/29/20 0033 09/29/20 0316 09/29/20 0319  WBC 7.9  --  6.0  --  5.7  --  9.8  --   --  9.2  --   NEUTROABS  --   --   --   --   --   --  6.0  --   --   --   --   HGB 9.1*   < > 8.0*   < > 11.4*  9.5*   < > 9.1* 9.2* 11.2* 9.5* 9.5*  HCT 27.3*   < > 24.6*   < > 34.3*  28.0*   < > 28.0* 27.0* 33.0* 30.3* 28.0*  MCV 95.5  --  96.1  --  95.0  --  94.6  --   --  96.5  --   PLT  105*  --  98*  --  80*  --  104*  --   --  105*  --    < > = values in this interval not displayed.    Cardiac Enzymes: No results for input(s): CKTOTAL, CKMB, CKMBINDEX, TROPONINI in the last 168 hours.  BNP: BNP (last 3 results) No results for input(s): BNP in the last 8760 hours.  ProBNP (last 3 results) No results for input(s): PROBNP in the last 8760 hours.    Other results:  Imaging: DG CHEST PORT 1 VIEW  Result Date: 09/29/2020 CLINICAL DATA:  ECMO.  Recent COVID EXAM: PORTABLE CHEST 1 VIEW COMPARISON:  09/28/2020 FINDINGS: Support devices remain in place, unchanged. Extensive bilateral airspace disease, unchanged. No visible effusions or pneumothorax. IMPRESSION: No change severe bilateral airspace disease. Electronically Signed   By: Rolm Baptise M.D.   On: 09/29/2020 07:33   DG CHEST PORT 1 VIEW  Result Date: 09/28/2020 CLINICAL DATA:  59 year old male COVID-30. EXAM: PORTABLE CHEST 1 VIEW COMPARISON:  Portable chest 09/27/2020 and earlier. FINDINGS: Portable AP semi upright view at 0527 hours. Stable ECMO cannula. Stable tracheostomy tube, left PICC line, visible enteric feeding tube. Mildly lower lung volumes.  Mediastinal contours remain within normal limits. No pneumothorax. Patchy and confluent bilateral pulmonary opacity. Improved perihilar ventilation since 09/26/2020. No definite pleural effusion. Paucity of bowel gas in the upper abdomen. IMPRESSION: 1.  Stable lines and tubes. 2. Severe bilateral pneumonia with mildly improved ventilation in both lungs since 09/26/2020. Electronically Signed   By: Genevie Ann M.D.   On: 09/28/2020 07:21   DG Abd Portable 1V  Result Date: 09/27/2020 CLINICAL DATA:  Ileus EXAM: PORTABLE ABDOMEN - 1 VIEW COMPARISON:  September 06, 2020 FINDINGS: Feeding tube in the small bowel at the level of ligament of Treitz. ECMO cannula in place projects over the T12 level entering via superior approach, off the field of the radiograph. Stable position compared to prior CT evaluation. Diffuse pulmonary opacities with similar appearance, partially imaged. Scattered gas-filled loops of small bowel without signs of significant dilation. Gas and stool throughout the colon. Small amount of gas in the mid rectum. No acute skeletal process to the extent evaluated. IMPRESSION: 1. Feeding tube in the small bowel at the level of the ligament of Treitz. 2. Scattered gas-filled loops of small bowel without signs of significant dilation, may reflect mild ileus. 3. ECMO cannula passing below the RIGHT hemidiaphragm tip at the upper margin of T12 as on prior CT. 4. Diffuse interstitial and airspace opacities at the lung bases as on the prior study. Electronically Signed   By: Zetta Bills M.D.   On: 09/27/2020 11:22     Medications:     Scheduled Medications: . amLODipine  10 mg Per Tube QHS  . B-complex with vitamin C  1 tablet Per Tube Daily  . chlorhexidine gluconate (MEDLINE KIT)  15 mL Mouth Rinse BID  . Chlorhexidine Gluconate Cloth  6 each Topical Daily  . clonazePAM  2 mg Per Tube Q8H  . cloNIDine  0.2 mg Per Tube TID  . docusate  100 mg Per Tube BID  . feeding supplement (PROSource TF)   45 mL Per Tube QID  . fiber  1 packet Per Tube BID  . insulin aspart  2-6 Units Subcutaneous Q4H  . insulin aspart  3 Units Subcutaneous Q4H  . insulin detemir  13 Units Subcutaneous BID  . levothyroxine  50 mcg Per Tube Q0600  . mouth  rinse  15 mL Mouth Rinse 10 times per day  . metoprolol tartrate  25 mg Per Tube Q12H  . nystatin  5 mL Per Tube QID  . oxyCODONE  10 mg Per Tube Q6H  . pantoprazole sodium  40 mg Per Tube BID  . polyethylene glycol  17 g Per Tube BID  . pravastatin  40 mg Per Tube q1800  . QUEtiapine  150 mg Per Tube TID  . sodium chloride flush  10-40 mL Intracatheter Q12H    Infusions: .  prismasol BGK 4/2.5 500 mL/hr at 09/28/20 1159  .  prismasol BGK 4/2.5 300 mL/hr at 09/28/20 0837  . sodium chloride 10 mL/hr at 09/20/20 1900  . albumin human    . albumin human Stopped (09/27/20 2009)  . bivalirudin (ANGIOMAX) infusion 0.5 mg/mL (Non-ACS indications) 0.03 mg/kg/hr (09/29/20 0423)  . ceFEPime (MAXIPIME) IV 2 g (09/29/20 0519)  . feeding supplement (PIVOT 1.5 CAL) 1,000 mL (09/28/20 2029)  . fentaNYL infusion INTRAVENOUS Stopped (09/28/20 1415)  . niCARDipine 8 mg/hr (09/29/20 0423)  . norepinephrine (LEVOPHED) Adult infusion Stopped (09/26/20 2335)  . potassium PHOSPHATE IVPB (in mmol)    . prismasol BGK 4/2.5 1,500 mL/hr at 09/28/20 1839  . propofol (DIPRIVAN) infusion Stopped (09/28/20 0734)  . vancomycin Stopped (09/28/20 2122)    PRN Medications: acetaminophen (TYLENOL) oral liquid 160 mg/5 mL, albumin human, albuterol, diphenhydrAMINE, fentaNYL, Gerhardt's butt cream, haloperidol lactate, heparin, labetalol, lip balm, LORazepam, ondansetron **OR** ondansetron (ZOFRAN) IV, phenylephrine, sodium chloride flush   Assessment/Plan:   1. Acute hypoxic respiratory failure/ARDS in setting of COVID-19 PNA - Cannulated for VV ECMO on 12/8 after failing intubation x 2 days - has complete remedisivir, baricitinib, Solumedrol.  - s/p trach 12/16 - CT chest  12/26 with severe lung disease and pneumomediastinum. Lungs not recovering well.  - Restarted vanc/cefepime 12/26 - Circuit changed 12/13 for elevated LDH and lower flows. PLEX started 12/15. S/p PLEX #4/4 on 12/21. LDH stable. Circuit parameters stable.   - Sweep at 9 today.   - CVVH started 12/27 with intractable volume overload, CXR with slow improvement. I/Os negative. Have had trouble with chugging in circuit, but seems stable when we pull net negative 50 cc/hr.  Continue today.  - PTT goal turned down to 40-50 due to [facility]. Improved. Circuit looks ok. PTT 43 today. Discussed dosing with PharmD personally. - Mental status remains a major issue. No purposeful response but moves arms.  Suspect delirium, not improved off Propofol.   2. Bilateral DVT - remains on bivalirudin - Dosing d/w PharmD - no change  3. DM2 - Insulin adjusted. CBGs improved   4. Obesity -Body mass index is 33.73 kg/m.  5. F/E/N - Stable TFs  6. HTN - Remains very labile  - Now off nicardipine and getting amlodipine, clonidine, and metoprolol.   7. Rectal bleeding - follow closely. Rectal tube removed  8. AKI - Now on CVVH primarily for intractable volume overload, see above.   9. Acute encephalopathy - remains agitated  - head CT ok 09/07/20 and 12/21 - head CT 12/26. Small SAH.  Repeat CT on 12/27 with unchanged small SAH.   10. Small SAH - on CT 12/26 - repeat CT 12/27 unchanged small SAH.  - PTT goal reduced 40-50 - avoid severe HTN as best as possible  Plan discussed on multi-discplinary ECMO rounds with CCM, ECMO coordinator/specialist, PharmDs and RNs.  CRITICAL CARE Performed by: Loralie Champagne  Total critical care time: 40 minutes  Critical care time was exclusive of separately billable procedures and treating other patients.  Critical care was necessary to treat or prevent imminent or life-threatening deterioration.  Critical care was time spent personally by me (independent of  midlevel providers or residents) on the following activities: development of treatment plan with patient and/or surrogate as well as nursing, discussions with consultants, evaluation of patient's response to treatment, examination of patient, obtaining history from patient or surrogate, ordering and performing treatments and interventions, ordering and review of laboratory studies, ordering and review of radiographic studies, pulse oximetry and re-evaluation of patient's condition.    Length of Stay: 85   Loralie Champagne MD 09/29/2020, 7:59 AM  Advanced Heart Failure Team Pager [phone] (M-F; [place])  Please contact [facility] for night-coverage after hours (4p -7a ) and weekends on [email or web address]

## 2020-09-29 NOTE — Progress Notes (Signed)
ANTICOAGULATION CONSULT NOTE  Pharmacy Consult for bivalirudin Indication: ECMO + bilateral DVTs + SAH 12/26   Recent Labs    09/27/20 0339 09/27/20 0351 09/28/20 0301 09/28/20 0302 09/28/20 0403 09/28/20 1600 09/28/20 1700 09/28/20 2000 09/28/20 2014 09/29/20 0033 09/29/20 0315 09/29/20 0316 09/29/20 0319  HGB 9.1*   < >  --  11.4*  9.5*   < >  --   --  9.1*   < > 11.2*  --  9.5* 9.5*  HCT 27.3*   < >  --  34.3*  28.0*   < >  --   --  28.0*   < > 33.0*  --  30.3* 28.0*  PLT 105*   < >  --  80*  --   --   --  104*  --   --   --  105*  --   APTT 47*   < > 54*  --   --   --  43*  --   --   --  43*  --   --   LABPROT 15.7*  --   --   --   --   --   --   --   --   --   --   --   --   INR 1.3*  --   --   --   --   --   --   --   --   --   --   --   --   CREATININE 0.77   < >  --  0.63  --  0.64  --   --   --   --   --  0.65  --    < > = values in this interval not displayed.    Estimated Creatinine Clearance: 119.3 mL/min (by C-G formula based on SCr of 0.65 mg/dL).   Assessment: 49 yom unvaccinated presenting with severe COVID ARDs, intubated on 12/6, continued to have ventilation issues despite NMB and proning - started on VV ECMO on 12/8. Pt noted to have bilateral DVTs 12/8. Bivalirudin started for ECMO anticoagulation. Head CT 12/26 with new small SAH, repeat 12/27 stable without expansion - bivalirudin aPTT goal lowered.  aPTT this morning is therapeutic at 43 seconds.  Goal of Therapy:  aPTT 40-50 seconds (new 12/26)  Monitor platelets by anticoagulation protocol: Yes   Plan:  Continue bivalirudin at 0.03 mg/kg/hr (weight 103kg) Check q12h aPTT and CBC  Monitor for S/Sx bleeding  Jenetta Downer, BCCP Clinical Pharmacist  09/29/2020 3:26 PM   [facility] pharmacy phone numbers are listed on [email or web address]

## 2020-09-29 NOTE — Procedures (Signed)
Extracorporeal support note     ECLS cannulation: 2020/10/04 Last circuit change: 09/11/2020  Indication: COVD ARDS   Configuration: VV, RIJ 32Fr [place]   Pump speed: 3300 Pump flow: 4.0 L Pump used: Cardiohelp   Sweep gas: 100%, 9 LPM   Circuit check:  No clot noted Anticoagulant: bivalirudin   Changes in support: continue current support.  Wean sweep as tolerated. Can lower flow rates as oxygenation has not been an issue.  Agitation is likely limiting ability to wean sweep.  anticipated goals/duration of support: bridge to recovery, day 23 of support.   Lynnell Catalan, MD [facility] ICU Physician [facility]  Pager: [phone] Or Epic Secure Chat After hours: [phone].  09/29/2020, 10:36 AM

## 2020-09-29 NOTE — Progress Notes (Signed)
[facility] NEPHROLOGY PROGRESS NOTE  Assessment/ Plan: Pt is a 59 y.o. 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  # AKI, nonoliguric: due to ischemic ATN associted with covid-19 infection, IV contrast. Mainly started for volume overload w/ significant IV in requirements. Volume overload improved. Right femoral HD catheter placed 09/13/20 by PCCM.   Started CRRT on 12/27, all 4K bath, UF 50-100 cc/hour as tolerated by BP.  On Angiomax for anticoagulation. 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  LDH stable.   #Acute hypoxic respiratory failure/ARDS- secondary to covid-19 infection on VV ECMO. S/p trach 09/14/20.  # Bilateral lower extremity DVT's- on bivalirudin.  # HCAP/MRSA pneumonitis/tracheitis- on IV vancomycin.  # HTN/volume: On nicardipine, norvasc 10mg  daily, clonidine 0.2mg  TID, metop 25mg  BID.  CRRT to manage volume.   Subjective: Seen on CRRT. Tolerating with no issues. UOP continues to be good. IV ins have lessened and was net negative 2.5L yesterday. On aggressive BP control with nicardipine.  Objective Vital signs in last 24 hours: Vitals:   09/29/20 0700 09/29/20 0800 09/29/20 0808 09/29/20 0900  BP: (!) 161/75 (!) 164/92 (!) 176/66 (!) 162/75  Pulse: (!) 118 (!) 116 (!) 118 (!) 124  Resp: (!) 44 (!) 43 (!) 50 (!) 33  Temp:      TempSrc:      SpO2: 95% 97% 95% 97%  Weight:      Height:       Weight change: 0.3 kg  Intake/Output Summary (Last 24 hours) at 09/29/2020 0953 Last data filed at  09/29/2020 0900 Gross per 24 hour  Intake 3291.61 ml  Output 5841 ml  Net -2549.39 ml       Labs: Basic Metabolic Panel: Recent Labs  Lab 09/28/20 0302 09/28/20 0403 09/28/20 1600 09/28/20 2014 09/29/20 0033 09/29/20 0316 09/29/20 0319  NA 141  142   < > 139   < > 141 140 141  K 4.7  4.6   < > 5.2*   < > 4.7 4.5 4.6  CL 108  --  107  --   --  106  --   CO2 28  --  25  --   --  26  --   GLUCOSE 108*  --  135*  --   --  143*  --   BUN 42*  --  41*  --   --  40*  --   CREATININE 0.63  --  0.64  --   --  0.65  --   CALCIUM 7.9*  --  8.3*  --   --  8.4*  --   PHOS 3.1  --  3.0  --   --  2.4*  --    < > = values in this interval not displayed.   Liver Function Tests: Recent Labs  Lab 09/25/20 0400 09/25/20 1600 09/26/20 0403 09/26/20 1548 09/27/20 0339 09/27/20 1701 09/28/20 0302 09/28/20 1600 09/29/20 0316  AST 44*  --  49*  --  49*  --   --   --   --   ALT 59*  --  39  --  50*  --   --   --   --   ALKPHOS 113  --  112  --  138*  --   --   --   --   BILITOT 1.7*  --  1.7*  --  1.4*  --   --   --   --   PROT 4.5*  --  5.1*  --  5.4*  --   --   --   --   ALBUMIN 2.5*   < > 2.7*  2.6*   < > 2.8*  2.8*   < > 3.0* 3.0* 2.8*   < > = values in this interval not displayed.   No results for input(s): LIPASE, AMYLASE in the last 168 hours. Recent Labs  Lab 09/24/20 1128  AMMONIA 36*   CBC: Recent Labs  Lab 09/27/20 0339 09/27/20 0351 09/27/20 1701 09/27/20 1940 09/28/20 0302 09/28/20 0403 09/28/20 2000 09/28/20 2014 09/29/20 0033 09/29/20 0316 09/29/20 0319  WBC 7.9  --  6.0  --  5.7  --  9.8  --   --  9.2  --   NEUTROABS  --   --   --   --   --   --  6.0  --   --   --   --   HGB 9.1*   < > 8.0*   < > 11.4*  9.5*   < > 9.1*   < > 11.2* 9.5* 9.5*  HCT 27.3*   < > 24.6*   < > 34.3*  28.0*   < > 28.0*   < > 33.0* 30.3* 28.0*  MCV 95.5  --  96.1  --  95.0  --  94.6  --   --  96.5  --   PLT 105*  --  98*  --  80*  --  104*  --   --  105*  --    < > =  values in this interval not displayed.   Cardiac Enzymes: No results for input(s): CKTOTAL, CKMB, CKMBINDEX, TROPONINI in the last 168 hours. CBG: Recent Labs  Lab 09/28/20 1532 09/28/20 1958 09/29/20 0028 09/29/20 0314 09/29/20 0803  GLUCAP 147* 117* 139* 142* 144*    Iron Studies: No results for input(s): IRON, TIBC, TRANSFERRIN, FERRITIN in the last 72 hours. Studies/Results: DG CHEST PORT 1 VIEW  Result Date: 09/29/2020 CLINICAL DATA:  ECMO.  Recent COVID EXAM: PORTABLE CHEST 1 VIEW COMPARISON:  09/28/2020 FINDINGS: Support devices remain in place, unchanged. Extensive bilateral airspace disease, unchanged. No visible effusions or pneumothorax. IMPRESSION: No change severe bilateral airspace disease. Electronically Signed   By: Rolm Baptise M.D.   On: 09/29/2020 07:33   DG CHEST PORT 1 VIEW  Result Date: 09/28/2020 CLINICAL DATA:  59 year old male COVID-35. EXAM: PORTABLE CHEST 1 VIEW COMPARISON:  Portable chest 09/27/2020 and earlier. FINDINGS: Portable AP semi upright view at 0527 hours. Stable ECMO cannula. Stable tracheostomy tube, left PICC line, visible enteric feeding tube. Mildly lower lung volumes. Mediastinal contours remain within normal limits. No pneumothorax. Patchy and confluent bilateral pulmonary opacity. Improved perihilar ventilation since 09/26/2020. No definite pleural effusion. Paucity of bowel gas in the upper abdomen. IMPRESSION: 1.  Stable lines and tubes. 2. Severe bilateral pneumonia with mildly improved ventilation in both lungs since 09/26/2020. Electronically Signed   By: Genevie Ann M.D.   On: 09/28/2020 07:21   DG Abd Portable 1V  Result Date: 09/27/2020 CLINICAL DATA:  Ileus EXAM: PORTABLE ABDOMEN -  1 VIEW COMPARISON:  September 06, 2020 FINDINGS: Feeding tube in the small bowel at the level of ligament of Treitz. ECMO cannula in place projects over the T12 level entering via superior approach, off the field of the radiograph. Stable position compared to  prior CT evaluation. Diffuse pulmonary opacities with similar appearance, partially imaged. Scattered gas-filled loops of small bowel without signs of significant dilation. Gas and stool throughout the colon. Small amount of gas in the mid rectum. No acute skeletal process to the extent evaluated. IMPRESSION: 1. Feeding tube in the small bowel at the level of the ligament of Treitz. 2. Scattered gas-filled loops of small bowel without signs of significant dilation, may reflect mild ileus. 3. ECMO cannula passing below the RIGHT hemidiaphragm tip at the upper margin of T12 as on prior CT. 4. Diffuse interstitial and airspace opacities at the lung bases as on the prior study. Electronically Signed   By: Zetta Bills M.D.   On: 09/27/2020 11:22    Medications: Infusions: .  prismasol BGK 4/2.5 500 mL/hr at 09/28/20 1159  .  prismasol BGK 4/2.5 300 mL/hr at 09/28/20 0837  . sodium chloride 10 mL/hr at 09/20/20 1900  . albumin human    . albumin human Stopped (09/27/20 2009)  . bivalirudin (ANGIOMAX) infusion 0.5 mg/mL (Non-ACS indications) 0.03 mg/kg/hr (09/29/20 0900)  . ceFEPime (MAXIPIME) IV Stopped (09/29/20 0549)  . feeding supplement (PIVOT 1.5 CAL) 1,000 mL (09/28/20 2029)  . fentaNYL infusion INTRAVENOUS Stopped (09/28/20 1415)  . furosemide    . niCARDipine 8 mg/hr (09/29/20 0900)  . norepinephrine (LEVOPHED) Adult infusion Stopped (09/26/20 2335)  . potassium PHOSPHATE IVPB (in mmol) 20 mmol (09/29/20 0922)  . prismasol BGK 4/2.5 1,500 mL/hr at 09/29/20 0931  . propofol (DIPRIVAN) infusion Stopped (09/28/20 0734)  . vancomycin 1,000 mg (09/29/20 0904)    Scheduled Medications: . amLODipine  10 mg Per Tube QHS  . B-complex with vitamin C  1 tablet Per Tube Daily  . chlorhexidine gluconate (MEDLINE KIT)  15 mL Mouth Rinse BID  . Chlorhexidine Gluconate Cloth  6 each Topical Daily  . clonazePAM  2 mg Per Tube Q8H  . cloNIDine  0.2 mg Per Tube TID  . docusate  100 mg Per Tube BID   . feeding supplement (PROSource TF)  45 mL Per Tube QID  . fiber  1 packet Per Tube BID  . insulin aspart  2-6 Units Subcutaneous Q4H  . insulin aspart  3 Units Subcutaneous Q4H  . insulin detemir  13 Units Subcutaneous BID  . levothyroxine  50 mcg Per Tube Q0600  . mouth rinse  15 mL Mouth Rinse 10 times per day  . metoprolol tartrate  50 mg Per Tube Q12H  . nystatin  5 mL Per Tube QID  . oxyCODONE  10 mg Per Tube Q6H  . pantoprazole sodium  40 mg Per Tube BID  . polyethylene glycol  17 g Per Tube BID  . pravastatin  40 mg Per Tube q1800  . QUEtiapine  150 mg Per Tube TID  . sodium chloride flush  10-40 mL Intracatheter Q12H    have reviewed scheduled and prn medications.  Physical Exam:  General: Ill-appearing, lying in bed, sedated Heart: Normal rate, no audible murmur Lungs: Coarse breath sound bilateral bilateral chest rise Abdomen:soft, Non-tender, non-distended Extremities: Pitting edema +, anasarca+, improving Dialysis Access: Right femoral HD catheter site clean.  Shaune Pollack Ceairra Mccarver 09/29/2020,9:53 AM  LOS: 27 days

## 2020-09-29 NOTE — Progress Notes (Signed)
NAME:  Donald Rowe, MRN:  [id number], DOB:  Nov 04, 1960, LOS: 3 ADMISSION DATE:  09/13/2020, CONSULTATION DATE:  12/6 REFERRING MD:  Dr. Aileen Fass, CHIEF COMPLAINT:  SOB    Brief History   59 y/o M admitted 12/4 with 1 week hx of SOB, known COVID positive.  He is unvaccinated.  CTA chest negative for PE but demonstrated diffuse bilateral infiltrates. [place] started on 12/8   Past Medical History  Hypothyroidism  [facility] Events/Procedures  12/04 Admit  12/06 PCCM consulted  12/07 Intubated, central line, a line 12/08 VV ECMO Cannulaton, cortrak 12/13 circuit change for hemolytic anemia 12/16 Tracheostomy  12/17 bronch w/ BAL  Consults:  PCCM, Heart failure, TCTS, ECMO  Significant Diagnostic Tests:   CTA Chest 12/4 >> extensive bilateral airspace disease, no large PE identified, limited study   LE Venous Duplex 12/4 >> negative for DVT bilaterally   LE venous duplex 12/8> BLE DVTs  RUE venous duplex 12/23  RUE arterial duplex 12/23  CT head 12/26 : Minimal SAH on left side  CT abdomen and pelvis 12/26: Extensive bilateral airspace disease with small pleural effusion  CT head 12/27: Subarachnoid unchanged  Micro Data:  COVID 12/4 >> negative  Influenza A/B 12/4 >> negative  MRSA PCR 12/5 >> negative  BCx2 12/4 >> NG Trach aspirate 12/12> rare MRSA BAL 12/17> MRSA, candida Aspergillus Ag BAL 12/17> 0.05 12/18 sars2: positive  Antimicrobials:  Azithromycin 12/5 >> 12/6  Ceftriaxone 12/5 >> 12/6  Cefepime 12/7>12/14 vanc 12/14>12/20 Meropenem 12/17>12/21  Interim history/subjective:  Condition remains unchanged: continues to have swings in BP related to non-purposeful agitation. Tolerated fluid removal at 35m/h.  Has weaned off all IV sedatives.  Mental status has not worsened or improved.  Objective   Blood pressure (!) 162/75, pulse (!) 124, temperature 98.4 F (36.9 C), temperature source Oral, resp. rate (!) 33, height _0   (1.753 m), weight 106 kg, SpO2 97 %.    Vent Mode: PCV FiO2 (%):  [50 %] 50 % Set Rate:  [12 bmp] 12 bmp PEEP:  [10 cmH20] 10 cmH20 Plateau Pressure:  [23 cmH20-33 cmH20] 23 cmH20   Intake/Output Summary (Last 24 hours) at 09/29/2020 1039 Last data filed at 09/29/2020 1000 Gross per 24 hour  Intake 3514.54 ml  Output 5928 ml  Net -2413.46 ml   Filed Weights   09/27/20 0444 09/28/20 0500 09/29/20 0422  Weight: 107.1 kg 105.7 kg 106 kg    Examination: Gen: Well-developed, acutely ill-appearing obese male, lying on the bed  HEENT: NCAT, perrla, s/p trach Neck: supple, ECMO lines intact and functioning CV: Tachycardic, regular rhythm, no murmur  Pulm: Bilateral crackles, no wheezes or rhonchi, improved air entry bilaterally down to posterior axillary line Abd: Soft, BS+, NTND Extm: Peripheral pulses intact 2+ radial pulse on RUE, 1+ edema Skin: Dry, Warm,  Neuro: Eyes open but not following commands.  Nonpurposefully moving all 4 extremities.  Pupils 3 mm minimally reactive  Resolved Hospital Problem list     Assessment & Plan:   Acute hypoxemic and hypercarbic respiratory failure secondary to COVID PNA with severe ARDS. S/p VV ECMO cannulation 09/23/2020.  S/p tracheostomy 102/72complicated by trach site bleeding improved Left-sided subarachnoid hemorrhage Community-acquired pneumonia with MRSA Completed remdesivir.  Completed baricitinib.  c/w full ECMO support Taper Solu-Medrol off Completed for treatment with 4 sessions of Plex CRRT for fluid removal  Agitated delirium requiring titration of IV sedation.  Periods of severe agitation are limiting ability  to wean ECMO Keep all sedative infusions off Continue enteral seroquel, oxycodone and clonazepam Steroids are off.  Should also help delirium  Bilateral lower ext DVTs- provoked by COVID infection Continue bivalirudin,.   Steroid-induced hyperglycemia Fingersticks are better controlled now Decrease Levemir with  steroid wean. Continue TF coverage (but decreasing) and SSI PRN  Hypothyroidism con't PTA synthroid   Labile hypertension, contributed by periods of agitation.  Patient blood pressure appears less labile now that we are not actively titrating IV sedatives Currently on clonidine, hydralazine and metoprolol Titrate Cardene as necessary  Have increased metoprolol today.  May also help decrease CO2 production and facilitate sweep gas weaning  Hemolytic Anemia appears resolved Throbocytopenia Hyperbilirubinemia Rheumatologic-associated MAHA remains in differential.  hgb and plt are stable Completed treatment with the Plex for 4 sessions Hemoglobin has been stable in the last 24 hours Transfuse if hgb <8  Best practice (evaluated daily)  Diet: TF Pain/Anxiety/Delirium protocol (if indicated): Keep off infusions.  Use narcotics if necessary. VAP protocol (if indicated): yes DVT prophylaxis: Bivalirudin GI prophylaxis: pantoprazole  BID Glucose control: basal-bolus Mobility: As tolerated Last date of multidisciplinary goals of care discussion: wife updated over the phone 12/23 Summary of discussion con't aggressive care Follow up goals of care discussion due: 12/30 Code Status: full Disposition: ICU   Total critical care time: 40  minutes   Critical care time was exclusive of separately billable procedures and treating other patients.   Critical care was necessary to treat or prevent imminent or life-threatening deterioration.   Critical care was time spent personally by me on the following activities: development of treatment plan with patient and/or surrogate as well as nursing, discussions with consultants, evaluation of patient's response to treatment, examination of patient, obtaining history from patient or surrogate, ordering and performing treatments and interventions, ordering and review of laboratory studies, ordering and review of radiographic studies, pulse oximetry and  re-evaluation of patient's condition.   Kipp Brood, MD [facility] ICU Physician [place]  Pager: [phone] Or Epic Secure Chat After hours: [phone].  09/29/2020, 10:39 AM

## 2020-09-30 ENCOUNTER — Inpatient Hospital Stay (HOSPITAL_COMMUNITY): Payer: HRSA Program

## 2020-09-30 DIAGNOSIS — U071 COVID-19: Secondary | ICD-10-CM | POA: Diagnosis not present

## 2020-09-30 DIAGNOSIS — Z515 Encounter for palliative care: Secondary | ICD-10-CM | POA: Diagnosis not present

## 2020-09-30 DIAGNOSIS — Z7189 Other specified counseling: Secondary | ICD-10-CM | POA: Diagnosis not present

## 2020-09-30 DIAGNOSIS — J80 Acute respiratory distress syndrome: Secondary | ICD-10-CM | POA: Diagnosis not present

## 2020-09-30 DIAGNOSIS — E870 Hyperosmolality and hypernatremia: Secondary | ICD-10-CM | POA: Diagnosis not present

## 2020-09-30 DIAGNOSIS — J9601 Acute respiratory failure with hypoxia: Secondary | ICD-10-CM | POA: Diagnosis not present

## 2020-09-30 LAB — LACTATE DEHYDROGENASE: LDH: 498 U/L — ABNORMAL HIGH (ref 98–192)

## 2020-09-30 LAB — CBC
HCT: 25.1 % — ABNORMAL LOW (ref 39.0–52.0)
HCT: 27.5 % — ABNORMAL LOW (ref 39.0–52.0)
Hemoglobin: 8.5 g/dL — ABNORMAL LOW (ref 13.0–17.0)
Hemoglobin: 8.8 g/dL — ABNORMAL LOW (ref 13.0–17.0)
MCH: 32.3 pg (ref 26.0–34.0)
MCH: 30.9 pg (ref 26.0–34.0)
MCHC: 33.9 g/dL (ref 30.0–36.0)
MCHC: 32 g/dL (ref 30.0–36.0)
MCV: 95.4 fL (ref 80.0–100.0)
MCV: 96.5 fL (ref 80.0–100.0)
Platelets: 119 10*3/uL — ABNORMAL LOW (ref 150–400)
Platelets: 151 10*3/uL (ref 150–400)
RBC: 2.63 MIL/uL — ABNORMAL LOW (ref 4.22–5.81)
RBC: 2.85 MIL/uL — ABNORMAL LOW (ref 4.22–5.81)
RDW: 17.2 % — ABNORMAL HIGH (ref 11.5–15.5)
RDW: 17.3 % — ABNORMAL HIGH (ref 11.5–15.5)
WBC: 10.4 10*3/uL (ref 4.0–10.5)
WBC: 14.7 10*3/uL — ABNORMAL HIGH (ref 4.0–10.5)
nRBC: 0.2 % (ref 0.0–0.2)
nRBC: 0.2 % (ref 0.0–0.2)

## 2020-09-30 LAB — POCT I-STAT 7, (LYTES, BLD GAS, ICA,H+H)
Acid-Base Excess: 1 mmol/L (ref 0.0–2.0)
Acid-Base Excess: 2 mmol/L (ref 0.0–2.0)
Acid-Base Excess: 1 mmol/L (ref 0.0–2.0)
Acid-Base Excess: 0 mmol/L (ref 0.0–2.0)
Acid-base deficit: 2 mmol/L (ref 0.0–2.0)
Bicarbonate: 26.5 mmol/L (ref 20.0–28.0)
Bicarbonate: 27.8 mmol/L (ref 20.0–28.0)
Bicarbonate: 22.9 mmol/L (ref 20.0–28.0)
Bicarbonate: 25.7 mmol/L (ref 20.0–28.0)
Bicarbonate: 26.6 mmol/L (ref 20.0–28.0)
Calcium, Ion: 1.2 mmol/L (ref 1.15–1.40)
Calcium, Ion: 1.24 mmol/L (ref 1.15–1.40)
Calcium, Ion: 1.15 mmol/L (ref 1.15–1.40)
Calcium, Ion: 1.23 mmol/L (ref 1.15–1.40)
Calcium, Ion: 1.26 mmol/L (ref 1.15–1.40)
HCT: 25 % — ABNORMAL LOW (ref 39.0–52.0)
HCT: 26 % — ABNORMAL LOW (ref 39.0–52.0)
HCT: 24 % — ABNORMAL LOW (ref 39.0–52.0)
HCT: 26 % — ABNORMAL LOW (ref 39.0–52.0)
HCT: 34 % — ABNORMAL LOW (ref 39.0–52.0)
Hemoglobin: 8.5 g/dL — ABNORMAL LOW (ref 13.0–17.0)
Hemoglobin: 8.8 g/dL — ABNORMAL LOW (ref 13.0–17.0)
Hemoglobin: 8.2 g/dL — ABNORMAL LOW (ref 13.0–17.0)
Hemoglobin: 8.8 g/dL — ABNORMAL LOW (ref 13.0–17.0)
Hemoglobin: 11.6 g/dL — ABNORMAL LOW (ref 13.0–17.0)
O2 Saturation: 95 %
O2 Saturation: 96 %
O2 Saturation: 96 %
O2 Saturation: 98 %
O2 Saturation: 95 %
Patient temperature: 37.2
Patient temperature: 36.6
Patient temperature: 36.9
Patient temperature: 36.7
Patient temperature: 37
Potassium: 4.9 mmol/L (ref 3.5–5.1)
Potassium: 5.3 mmol/L — ABNORMAL HIGH (ref 3.5–5.1)
Potassium: 4.9 mmol/L (ref 3.5–5.1)
Potassium: 5 mmol/L (ref 3.5–5.1)
Potassium: 4.9 mmol/L (ref 3.5–5.1)
Sodium: 142 mmol/L (ref 135–145)
Sodium: 140 mmol/L (ref 135–145)
Sodium: 142 mmol/L (ref 135–145)
Sodium: 141 mmol/L (ref 135–145)
Sodium: 141 mmol/L (ref 135–145)
TCO2: 28 mmol/L (ref 22–32)
TCO2: 29 mmol/L (ref 22–32)
TCO2: 24 mmol/L (ref 22–32)
TCO2: 27 mmol/L (ref 22–32)
TCO2: 28 mmol/L (ref 22–32)
pCO2 arterial: 46.5 mmHg (ref 32.0–48.0)
pCO2 arterial: 45.3 mmHg (ref 32.0–48.0)
pCO2 arterial: 38.8 mmHg (ref 32.0–48.0)
pCO2 arterial: 40.1 mmHg (ref 32.0–48.0)
pCO2 arterial: 50 mmHg — ABNORMAL HIGH (ref 32.0–48.0)
pH, Arterial: 7.364 (ref 7.350–7.450)
pH, Arterial: 7.395 (ref 7.350–7.450)
pH, Arterial: 7.38 (ref 7.350–7.450)
pH, Arterial: 7.414 (ref 7.350–7.450)
pH, Arterial: 7.334 — ABNORMAL LOW (ref 7.350–7.450)
pO2, Arterial: 79 mmHg — ABNORMAL LOW (ref 83.0–108.0)
pO2, Arterial: 79 mmHg — ABNORMAL LOW (ref 83.0–108.0)
pO2, Arterial: 82 mmHg — ABNORMAL LOW (ref 83.0–108.0)
pO2, Arterial: 107 mmHg (ref 83.0–108.0)
pO2, Arterial: 82 mmHg — ABNORMAL LOW (ref 83.0–108.0)

## 2020-09-30 LAB — RENAL FUNCTION PANEL
Albumin: 2.8 g/dL — ABNORMAL LOW (ref 3.5–5.0)
Albumin: 3 g/dL — ABNORMAL LOW (ref 3.5–5.0)
Anion gap: 10 (ref 5–15)
Anion gap: 10 (ref 5–15)
BUN: 49 mg/dL — ABNORMAL HIGH (ref 6–20)
BUN: 55 mg/dL — ABNORMAL HIGH (ref 6–20)
CO2: 24 mmol/L (ref 22–32)
CO2: 24 mmol/L (ref 22–32)
Calcium: 8.2 mg/dL — ABNORMAL LOW (ref 8.9–10.3)
Calcium: 8.7 mg/dL — ABNORMAL LOW (ref 8.9–10.3)
Chloride: 106 mmol/L (ref 98–111)
Chloride: 106 mmol/L (ref 98–111)
Creatinine, Ser: 0.92 mg/dL (ref 0.61–1.24)
Creatinine, Ser: 1.2 mg/dL (ref 0.61–1.24)
GFR, Estimated: >60 mL/min (ref >60)
GFR, Estimated: >60 mL/min (ref >60)
Glucose, Bld: 73 mg/dL (ref 70–99)
Glucose, Bld: 58 mg/dL — ABNORMAL LOW (ref 70–99)
Phosphorus: 3.2 mg/dL (ref 2.5–4.6)
Phosphorus: 3.8 mg/dL (ref 2.5–4.6)
Potassium: 4.9 mmol/L (ref 3.5–5.1)
Potassium: 5.1 mmol/L (ref 3.5–5.1)
Sodium: 140 mmol/L (ref 135–145)
Sodium: 140 mmol/L (ref 135–145)

## 2020-09-30 LAB — MAGNESIUM: Magnesium: 3.2 mg/dL — ABNORMAL HIGH (ref 1.7–2.4)

## 2020-09-30 LAB — FIBRINOGEN: Fibrinogen: 486 mg/dL — ABNORMAL HIGH (ref 210–475)

## 2020-09-30 LAB — LACTIC ACID, PLASMA
Lactic Acid, Venous: 0.8 mmol/L (ref 0.5–1.9)
Lactic Acid, Venous: 0.5 mmol/L (ref 0.5–1.9)

## 2020-09-30 LAB — APTT
aPTT: 50 seconds — ABNORMAL HIGH (ref 24–36)
aPTT: 51 seconds — ABNORMAL HIGH (ref 24–36)
aPTT: 51 seconds — ABNORMAL HIGH (ref 24–36)

## 2020-09-30 LAB — TRIGLYCERIDES: Triglycerides: 38 mg/dL (ref <150)

## 2020-09-30 LAB — GLUCOSE, CAPILLARY: Glucose-Capillary: 69 mg/dL — ABNORMAL LOW (ref 70–99)

## 2020-09-30 MED ORDER — ALBUMIN HUMAN 5 % IV SOLN
INTRAVENOUS | Status: AC
  Administered 2020-09-30: 12.5 g
  Filled 2020-09-30: qty 250

## 2020-09-30 MED ORDER — PIVOT 1.5 CAL PO LIQD
1000.0000 mL | ORAL | Status: DC
  Administered 2020-09-30 – 2020-10-02 (×2): 1000 mL

## 2020-09-30 MED ORDER — DEXTROSE 50 % IV SOLN: 25.0000 mL | Freq: Once | INTRAVENOUS | Status: AC

## 2020-09-30 MED ORDER — DEXTROSE 50 % IV SOLN
INTRAVENOUS | Status: AC
  Administered 2020-09-30: 25 mL via INTRAVENOUS
  Filled 2020-09-30: qty 50

## 2020-09-30 MED ORDER — PROPOFOL 10 MG/ML IV BOLUS
INTRAVENOUS | Status: AC
  Filled 2020-09-30: qty 20

## 2020-09-30 MED ORDER — DEXTROSE 50 % IV SOLN
INTRAVENOUS | Status: AC
  Administered 2020-09-30: 25 mL
  Filled 2020-09-30: qty 50

## 2020-09-30 MED ORDER — PROPOFOL 10 MG/ML IV BOLUS
1.0000 mg/kg | Freq: Once | INTRAVENOUS | Status: AC
  Administered 2020-09-30: 70 mg via INTRAVENOUS

## 2020-09-30 MED ORDER — MIDAZOLAM HCL 2 MG/2ML IJ SOLN
4.0000 mg | Freq: Once | INTRAMUSCULAR | Status: AC
  Filled 2020-09-30: qty 4

## 2020-09-30 MED ORDER — DEXTROSE 50 % IV SOLN
12.5000 g | INTRAVENOUS | Status: AC
  Administered 2020-09-30: 12.5 g via INTRAVENOUS
  Filled 2020-09-30: qty 50

## 2020-09-30 MED ORDER — MIDAZOLAM HCL 2 MG/2ML IJ SOLN
INTRAMUSCULAR | Status: AC
  Administered 2020-09-30: 4 mg via INTRAVENOUS
  Filled 2020-09-30: qty 4

## 2020-09-30 MED ORDER — METOCLOPRAMIDE HCL 5 MG/ML IJ SOLN
10.0000 mg | Freq: Four times a day (QID) | INTRAMUSCULAR | Status: DC
  Administered 2020-09-30 – 2020-10-06 (×25): 10 mg via INTRAVENOUS
  Filled 2020-09-30 (×24): qty 2

## 2020-09-30 MED ORDER — SODIUM CHLORIDE 0.9 % IV SOLN
8.0000 mg | Freq: Once | INTRAVENOUS | Status: AC
  Administered 2020-09-30: 8 mg via INTRAVENOUS
  Filled 2020-09-30: qty 4

## 2020-09-30 NOTE — Progress Notes (Signed)
Patient ID: Donald Rowe, male   DOB: 09/26/1961, 60 y.o.   MRN: [id number]    Advanced Heart Failure Rounding Note   Subjective:    12/8 Cannulated for VV ECMO - 32 FR  RIJ [place] 12/9 Extubated/Reintubated for altered mental status 12/13 Circuit change for elevated LDH 12/15 PLEX started 12/16 Tracheostomy 12/23 head CT (normal) for change in mental status.  12/26 Pan CT for drop in hgb. Small SAH. Severe lung disease 12/27 CVVH begun  No PRBCs last night, hgb 8.5 today.    Off propofol, only getting Fentanyl.  Per nurse, had some purposeful movement overnight.   Emesis overnight, lost Cortrack.  CXR today with improving lung fields but dilated stomach consistent with gastric ileus.   SBP 150s, currently off nicardipine.  Getting clonidine, amlodipine, and metoprolol.  CVVH ongoing, pulling 50 cc/hr net negative currently I/Os negative.  Weight is back to his baseline.  UOP 1320 over last day.   Had PLEX 4/4 completed 12/22.   ECMO   Speed 3070 Flow 5.7 L Sweep 9 dP 21 pVen -44  ABG: 7.36/46.5/79/95% Hgb 8.5 LDH 434 -> 544 -> 419 -> 551 -> 579 -> 590 -> 438 -> 518 -> 488 -> 514 -> 498 Lactic acid 0.8 PTT 50  Vent 50%   Objective:   Weight Range:  Vital Signs:   Temp:  [98.1 F (36.7 C)-98.7 F (37.1 C)] 98.7 F (37.1 C) (01/01 0400) Pulse Rate:  [99-124] 117 (01/01 0700) Resp:  [24-50] 31 (01/01 0700) BP: (71-176)/(46-92) 135/68 (01/01 0700) SpO2:  [95 %-98 %] 96 % (01/01 0700) Arterial Line BP: (71-176)/(40-65) 166/65 (01/01 0700) FiO2 (%):  [50 %] 50 % (01/01 0400) Weight:  [99.5 kg] 99.5 kg (01/01 0500) Last BM Date: 09/29/20  Weight change: Filed Weights   09/28/20 0500 09/29/20 0422 09/30/20 0500  Weight: 105.7 kg 106 kg 99.5 kg    Intake/Output:   Intake/Output Summary (Last 24 hours) at 09/30/2020 0752 Last data filed at 09/30/2020 0701 Gross per 24 hour  Intake 4243.27 ml  Output 5699 ml  Net -1455.73 ml     Physical  Exam: General: NAD Neck: Tracheostomy. No JVD, no thyromegaly or thyroid nodule.  Lungs: Decreased bilaterally.  CV: Nondisplaced PMI.  Heart regular S1/S2, no S3/S4, no murmur.  No peripheral edema.  Abdomen: Soft, nontender, no hepatosplenomegaly, no distention.  Skin: Intact without lesions or rashes.  Neurologic: Able to follow some commands per nursing.  Extremities: No clubbing or cyanosis.  HEENT: Normal.    Telemetry: sinus 110s Personally reviewed    Labs: Basic Metabolic Panel: Recent Labs  Lab 09/26/20 0403 09/26/20 0405 09/27/20 0339 09/27/20 0351 09/28/20 0301 09/28/20 0302 09/28/20 0403 09/28/20 1600 09/28/20 2014 09/29/20 0315 09/29/20 0316 09/29/20 0319 09/29/20 1610 09/29/20 1617 09/29/20 2044 09/30/20 0404 09/30/20 0415  NA 142   < > 140   < >  --  141  142   < > 139   < >  --  140   < > 138 139 139 140 142  K 4.1   < > 4.3   < >  --  4.7  4.6   < > 5.2*   < >  --  4.5   < > 5.0 5.1 5.1 4.9 4.9  CL 109   < > 107   < >  --  108  --  107  --   --  106  --  105  --   --  106  --   CO2 26   < > 26   < >  --  28  --  25  --   --  26  --  25  --   --  24  --   GLUCOSE 96   < > 111*   < >  --  108*  --  135*  --   --  143*  --  134*  --   --  73  --   BUN 70*   < > 50*   < >  --  42*  --  41*  --   --  40*  --  46*  --   --  49*  --   CREATININE 0.92   < > 0.77   < >  --  0.63  --  0.64  --   --  0.65  --  0.79  --   --  0.92  --   CALCIUM 7.8*   < > 7.9*   < >  --  7.9*  --  8.3*  --   --  8.4*  --  8.3*  --   --  8.2*  --   MG 3.5*  --  3.1*  --  3.0*  --   --   --   --  3.1*  --   --   --   --   --  3.2*  --   PHOS 2.9   < > 3.4   < >  --  3.1  --  3.0  --   --  2.4*  --  3.7  --   --  3.2  --    < > = values in this interval not displayed.    Liver Function Tests: Recent Labs  Lab 09/24/20 0349 09/25/20 0400 09/25/20 1600 09/26/20 0403 09/26/20 1548 09/27/20 0339 09/27/20 1701 09/28/20 0302 09/28/20 1600 09/29/20 0316 09/29/20 1610  09/30/20 0404  AST 50* 44*  --  49*  --  49*  --   --   --   --   --   --   ALT 71* 59*  --  39  --  50*  --   --   --   --   --   --   ALKPHOS 137* 113  --  112  --  138*  --   --   --   --   --   --   BILITOT 1.9* 1.7*  --  1.7*  --  1.4*  --   --   --   --   --   --   PROT 4.8* 4.5*  --  5.1*  --  5.4*  --   --   --   --   --   --   ALBUMIN 2.8* 2.5*   < > 2.7*  2.6*   < > 2.8*  2.8*   < > 3.0* 3.0* 2.8* 3.0* 2.8*   < > = values in this interval not displayed.   No results for input(s): LIPASE, AMYLASE in the last 168 hours. Recent Labs  Lab 09/24/20 1128  AMMONIA 36*    CBC: Recent Labs  Lab 09/28/20 0302 09/28/20 0403 09/28/20 2000 09/28/20 2014 09/29/20 0316 09/29/20 0319 09/29/20 1610 09/29/20 1617 09/29/20 2044 09/30/20 0404 09/30/20 0415  WBC 5.7  --  9.8  --  9.2  --  10.2  --   --  10.4  --   NEUTROABS  --   --  6.0  --   --   --   --   --   --   --   --   HGB 11.4*  9.5*   < > 9.1*   < > 9.5*   < > 9.3* 9.5* 8.8* 8.5* 8.5*  HCT 34.3*  28.0*   < > 28.0*   < > 30.3*   < > 29.3* 28.0* 26.0* 25.1* 25.0*  MCV 95.0  --  94.6  --  96.5  --  95.4  --   --  95.4  --   PLT 80*  --  104*  --  105*  --  122*  --   --  119*  --    < > = values in this interval not displayed.    Cardiac Enzymes: No results for input(s): CKTOTAL, CKMB, CKMBINDEX, TROPONINI in the last 168 hours.  BNP: BNP (last 3 results) No results for input(s): BNP in the last 8760 hours.  ProBNP (last 3 results) No results for input(s): PROBNP in the last 8760 hours.    Other results:  Imaging: DG CHEST PORT 1 VIEW  Result Date: 09/29/2020 CLINICAL DATA:  ECMO.  Recent COVID EXAM: PORTABLE CHEST 1 VIEW COMPARISON:  09/28/2020 FINDINGS: Support devices remain in place, unchanged. Extensive bilateral airspace disease, unchanged. No visible effusions or pneumothorax. IMPRESSION: No change severe bilateral airspace disease. Electronically Signed   By: Rolm Baptise M.D.   On: 09/29/2020 07:33      Medications:     Scheduled Medications: . amLODipine  10 mg Per Tube QHS  . B-complex with vitamin C  1 tablet Per Tube Daily  . chlorhexidine gluconate (MEDLINE KIT)  15 mL Mouth Rinse BID  . Chlorhexidine Gluconate Cloth  6 each Topical Daily  . clonazePAM  2 mg Per Tube Q8H  . cloNIDine  0.2 mg Per Tube TID  . docusate  100 mg Per Tube BID  . feeding supplement (PROSource TF)  45 mL Per Tube QID  . fiber  1 packet Per Tube BID  . insulin aspart  2-6 Units Subcutaneous Q4H  . insulin aspart  3 Units Subcutaneous Q4H  . insulin detemir  13 Units Subcutaneous BID  . levothyroxine  50 mcg Per Tube Q0600  . mouth rinse  15 mL Mouth Rinse 10 times per day  . metoCLOPramide (REGLAN) injection  10 mg Intravenous Q6H  . metoprolol tartrate  50 mg Per Tube Q12H  . nystatin  5 mL Per Tube QID  . oxyCODONE  10 mg Per Tube Q6H  . pantoprazole sodium  40 mg Per Tube BID  . polyethylene glycol  17 g Per Tube BID  . pravastatin  40 mg Per Tube q1800  . QUEtiapine  150 mg Per Tube TID  . sodium chloride flush  10-40 mL Intracatheter Q12H    Infusions: .  prismasol BGK 4/2.5 500 mL/hr at 09/30/20 0522  .  prismasol BGK 4/2.5 300 mL/hr at 09/30/20 0522  . sodium chloride 10 mL/hr at 09/20/20 1900  . albumin human    . albumin human 12.5 g (09/29/20 1644)  . bivalirudin (ANGIOMAX) infusion 0.5 mg/mL (Non-ACS indications) 0.03 mg/kg/hr (09/30/20 0701)  . ceFEPime (MAXIPIME) IV Stopped (09/30/20 0524)  . feeding supplement (PIVOT 1.5 CAL) 1,000 mL (09/29/20 1621)  . fentaNYL infusion INTRAVENOUS Stopped (09/28/20 1415)  . niCARDipine Stopped (09/30/20 0501)  . norepinephrine (LEVOPHED) Adult  infusion Stopped (09/26/20 2335)  . prismasol BGK 4/2.5 1,500 mL/hr at 09/30/20 0522  . propofol (DIPRIVAN) infusion Stopped (09/28/20 0734)  . vancomycin Stopped (09/29/20 2113)    PRN Medications: acetaminophen (TYLENOL) oral liquid 160 mg/5 mL, albumin human, albuterol, diphenhydrAMINE,  fentaNYL, Gerhardt's butt cream, haloperidol lactate, heparin, labetalol, lip balm, LORazepam, ondansetron **OR** ondansetron (ZOFRAN) IV, phenylephrine, sodium chloride flush   Assessment/Plan:   1. Acute hypoxic respiratory failure/ARDS in setting of COVID-19 PNA - Cannulated for VV ECMO on 12/8 after failing intubation x 2 days - has complete remedisivir, baricitinib, Solumedrol.  - s/p trach 12/16 - CT chest 12/26 with severe lung disease and pneumomediastinum.  - Restarted vanc/cefepime 12/26 => complete course today and stop.  - Circuit changed 12/13 for elevated LDH and lower flows. PLEX started 12/15. S/p PLEX #4/4 on 12/21. LDH stable. Circuit parameters stable.   - Sweep at 9 today.   - CVVH started 12/27 with intractable volume overload, CXR with slow improvement. I/Os negative, weight back to baseline.  Made 1320 cc urine yesterday.  Plan to stop CVVH today and follow I/Os closely (use Lasix if needed).  - PTT goal turned down to 40-50 due to [facility]. Improved. Circuit looks ok. Will increase PTT goal back up to 50-70.  - Mental status remains a major issue. Now off propofol.  Possibly some purposeful movement overnight.    2. Bilateral DVT - remains on bivalirudin - Dosing d/w PharmD, increase PTT goal back to 50-70.   3. DM2 - Insulin adjusted. CBGs improved   4. Obesity -Body mass index is 33.73 kg/m.  5. F/E/N - Stable TFs  6. HTN - Remains very labile  - Now off nicardipine and getting amlodipine, clonidine, and metoprolol. With gastric ileus, will get NGT to decompress as well as Reglan.  Then will try to give his po meds.  If he cannot take the po meds, will need to go back on nicardipine.   7. Rectal bleeding - follow closely. Rectal tube removed  8. AKI - Stop CVVH today as above.   9. Acute encephalopathy - remains agitated  - head CT ok 09/07/20 and 12/21 - head CT 12/26. Small SAH.  Repeat CT on 12/27 with unchanged small SAH.   10. Small SAH - on  CT 12/26 - repeat CT 12/27 unchanged small SAH.  - PTT goal reduced 40-50 for several days, plan to go back to 50-70 today.  - avoid severe HTN as best as possible  11. Gastric ileus - Noted on plain films today.   - Start Reglan IV - Lost Cortrack, will replace NGT and put to suction.   Plan discussed on multi-discplinary ECMO rounds with CCM, ECMO coordinator/specialist, PharmDs and RNs.  CRITICAL CARE Performed by: Loralie Champagne  Total critical care time: 40 minutes  Critical care time was exclusive of separately billable procedures and treating other patients.  Critical care was necessary to treat or prevent imminent or life-threatening deterioration.  Critical care was time spent personally by me (independent of midlevel providers or residents) on the following activities: development of treatment plan with patient and/or surrogate as well as nursing, discussions with consultants, evaluation of patient's response to treatment, examination of patient, obtaining history from patient or surrogate, ordering and performing treatments and interventions, ordering and review of laboratory studies, ordering and review of radiographic studies, pulse oximetry and re-evaluation of patient's condition.    Length of Stay: 28   Loralie Champagne MD 09/30/2020, 7:52 AM  Advanced Heart Failure Team Pager [phone] (M-F; [place])  Please contact [facility] for night-coverage after hours (4p -7a ) and weekends on [email or web address]

## 2020-09-30 NOTE — Progress Notes (Signed)
   Palliative Medicine Inpatient Follow Up Note  Reason for consult:  "ECMO"  HPI:  Per intake H&P --> Donald Rowe a 59 y.o.malewith a pertinent history ofhypothyroidism on levothyroxine and hyperlipidemia who was diagnosed with Covid 1 week ago and is not vaccinated. With EMS he was seen to be desaturating to 78% on room air. He states he started having symptoms on Friday and tested positive on Saturday.  Had initially elected to be DNAR without intubation though later changed this and got intubated on 12/6  in the setting of worsening respiratory failure with hypoxia. CTA chest w/ diffuse bilateral infiltrates. On 12/8 was cannulated for VV ECMO. Had been extubated on 12/9 then emergently reintubated in early evening due to clinical instability.   Palliative care was asked to get involved in the setting of VV ECMO to set goals and expectations with [name]'s family.  Today's Discussion (09/30/2020): Chart reviewed.  Attended ECMO rounds.  Per discussion with nursing concerns about ongoing emesis. From a cognitive stand point able to stick tongue out.   Plan by the team to place an NGT for decompression and initiation of reglan. Hgb has been stable.Edema is improving gradually has been on CVVH since 12/27. Anemia is stable - rectal tube removed prior. Patient remains to have complicated ICU associated delirium on fentanyl gtt and Seroquel. Cardiohelp rebooted by ECMO team.   Patients wife, Donald Rowe updated over the phone. She was provided with a comprehensive update regarding patients clinical state. We discussed his present ileus and the plan for an NGT to be placed.  SUMMARY OF RECOMMENDATIONS Full Code/Full Scope of Care  ECMO as a bridge to recovery  Spiritual support appreciated  Ongoing PMT support    Time Spent: 25 Greater than 50% of the time was spent in counseling and coordination of  care ______________________________________________________________________________________ [name] [facility] Palliative Medicine Team Team Cell Phone: [phone] Please utilize secure chat with additional questions, if there is no response within 30 minutes please call the above phone number  Palliative Medicine Team providers are available by phone from 7am to 7pm daily and can be reached through the team cell phone.  Should this patient require assistance outside of these hours, please call the patient's attending physician.

## 2020-09-30 NOTE — Procedures (Signed)
Seen and examined on CRRT.  Tolerating well.  Volume status improved.  No restart CRRT today

## 2020-09-30 NOTE — Progress Notes (Signed)
ANTICOAGULATION CONSULT NOTE  Pharmacy Consult for bivalirudin Indication: ECMO + bilateral DVTs + [facility] 12/26   Recent Labs    09/29/20 0315 09/29/20 0316 09/29/20 0319 09/29/20 1610 09/29/20 1617 09/29/20 2044 09/30/20 0404 09/30/20 0415  HGB  --  9.5*   < > 9.3*   < > 8.8* 8.5* 8.5*  HCT  --  30.3*   < > 29.3*   < > 26.0* 25.1* 25.0*  PLT  --  105*  --  122*  --   --  119*  --   APTT 43*  --   --  42*  --   --  50*  --   CREATININE  --  0.65  --  0.79  --   --  0.92  --    < > = values in this interval not displayed.    Estimated Creatinine Clearance: 100.5 mL/min (by C-G formula based on SCr of 0.92 mg/dL).   Assessment: 67 yom unvaccinated presenting with severe COVID ARDs, intubated on 12/6, continued to have ventilation issues despite NMB and proning - started on VV ECMO on 12/8. Pt noted to have bilateral DVTs 12/8. Bivalirudin started for ECMO anticoagulation. Head CT 12/26 with new small SAH, repeat 12/27 stable without expansion.  aPTT this morning (50) within established goal range.  Not bleeding currently except a little around trach per RN report.  Goal of Therapy:  aPTT 50-70 seconds (new 12/26)  Monitor platelets by anticoagulation protocol: Yes   Plan:  Discussed with Drs. McLean and Agarwala, will increase aPTT goal to 50-70 seconds today. Increase bivalirudin to 0.034 mg/kg/hr. Recheck aPTT with 5 pm labs. Q12 hr aPTT, CBC.  Reece Leader, Colon Flattery, [facility] Clinical Pharmacist  09/30/2020 8:44 AM   [facility] pharmacy phone numbers are listed on [email or web address]

## 2020-09-30 NOTE — Progress Notes (Signed)
Assisted family with video chat.

## 2020-09-30 NOTE — Progress Notes (Signed)
NAME:  Donald Rowe, MRN:  [id number], DOB:  [birth year], LOS: 32 ADMISSION DATE:  09/07/2020, CONSULTATION DATE:  12/6 REFERRING MD:  Dr. Aileen Fass, CHIEF COMPLAINT:  SOB    Brief History   60 y/o M admitted 12/4 with 1 week hx of SOB, known COVID positive.  He is unvaccinated.  CTA chest negative for PE but demonstrated diffuse bilateral infiltrates. Williams started on 12/8   Past Medical History  Hypothyroidism  [facility] Events/Procedures  12/04 Admit  12/06 PCCM consulted  12/07 Intubated, central line, a line 12/08 VV ECMO Cannulaton, cortrak 12/13 circuit change for hemolytic anemia 12/16 Tracheostomy  12/17 bronch w/ BAL  Consults:  PCCM, Heart failure, TCTS, ECMO  Significant Diagnostic Tests:   CTA Chest 12/4 >> extensive bilateral airspace disease, no large PE identified, limited study   LE Venous Duplex 12/4 >> negative for DVT bilaterally   LE venous duplex 12/8> BLE DVTs  RUE venous duplex 12/23  RUE arterial duplex 12/23  CT head 12/26 : Minimal SAH on left side  CT abdomen and pelvis 12/26: Extensive bilateral airspace disease with small pleural effusion  CT head 12/27: Subarachnoid unchanged  Chest x-ray 1/1: Significant improvement in airspace disease  Micro Data:  COVID 12/4 >> negative  Influenza A/B 12/4 >> negative  MRSA PCR 12/5 >> negative  BCx2 12/4 >> NG Trach aspirate 12/12> rare MRSA BAL 12/17> MRSA, candida Aspergillus Ag BAL 12/17> 0.05 12/18 sars2: positive  Antimicrobials:  Azithromycin 12/5 >> 12/6  Ceftriaxone 12/5 >> 12/6  Cefepime 12/7>12/14 vanc 12/14>12/20 Meropenem 12/17>12/21  Interim history/subjective:  Generally unchanged.  Blood pressure swings less pronounced now that he is off IV sedation.  Showing some signs of improved mental status as he will follow commands intermittently. Vomited last night.  Small bore feeding tube fell out.  Objective   Blood pressure (!) 154/71, pulse (!) 120,  temperature 98.4 F (36.9 C), resp. rate (!) 29, height _0  (1.753 m), weight 99.5 kg, SpO2 98 %.    Vent Mode: PCV FiO2 (%):  [50 %-100 %] 60 % Set Rate:  [12 bmp] 12 bmp PEEP:  [10 cmH20] 10 cmH20 Plateau Pressure:  [34 cmH20] 34 cmH20   Intake/Output Summary (Last 24 hours) at 09/30/2020 1602 Last data filed at 09/30/2020 1500 Gross per 24 hour  Intake 2273.37 ml  Output 2749 ml  Net -475.63 ml   Filed Weights   09/28/20 0500 09/29/20 0422 09/30/20 0500  Weight: 105.7 kg 106 kg 99.5 kg    Examination: Gen: Well-developed, acutely ill-appearing obese male, lying on the bed  HEENT: NCAT, perrla, s/p trach Neck: supple, ECMO lines intact and functioning CV: Tachycardic, regular rhythm, no murmur  Pulm: Bilateral crackles, no wheezes or rhonchi, improved air entry bilaterally down to posterior axillary line, much improved from earlier this week Abd: Soft, BS+, NTND Extm: Peripheral pulses intact 2+ radial pulse on RUE, 1+ edema Skin: Dry, Warm,  Neuro: Eyes open but not following commands.  Nonpurposefully moving all 4 extremities.  Pupils 3 mm minimally reactive  Resolved Hospital Problem list     Assessment & Plan:   Acute hypoxemic and hypercarbic respiratory failure secondary to COVID PNA with severe ARDS. S/p VV ECMO cannulation 09/29/2020.  S/p tracheostomy 50/93 complicated by trach site bleeding improved Left-sided subarachnoid hemorrhage Community-acquired pneumonia with MRSA Completed remdesivir.  Completed baricitinib.  c/w full ECMO support Solu-Medrol stopped Completed for treatment with 4 sessions of Plex CRRT for fluid  removal  Agitated delirium requiring titration of IV sedation.  Periods of severe agitation are limiting ability to wean ECMO Keep all sedative infusions off Continue enteral seroquel, oxycodone and clonazepam Steroids are off.  Should also help delirium  Bilateral lower ext DVTs- provoked by COVID infection Continue bivalirudin,.    Steroid-induced hyperglycemia Fingersticks are better controlled now Decrease Levemir with steroid wean. Continue TF coverage (but decreasing) and SSI PRN  Hypothyroidism con't PTA synthroid   Labile hypertension, contributed by periods of agitation.  Patient blood pressure appears less labile now that we are not actively titrating IV sedatives Currently on clonidine, hydralazine and metoprolol Titrate Cardene as necessary  Have increased metoprolol.  May also help decrease CO2 production and facilitate sweep gas weaning  Hemolytic Anemia appears resolved Throbocytopenia Hyperbilirubinemia Rheumatologic-associated MAHA remains in differential.  hgb and plt are stable Completed treatment with the Plex for 4 sessions Hemoglobin has been stable in the last 24 hours Transfuse if hgb <8  Best practice (evaluated daily)  Diet: TF Pain/Anxiety/Delirium protocol (if indicated): Keep off infusions.  Use narcotics if necessary. VAP protocol (if indicated): yes DVT prophylaxis: Bivalirudin GI prophylaxis: pantoprazole  BID Glucose control: basal-bolus Mobility: As tolerated Last date of multidisciplinary goals of care discussion: wife updated over the phone 12/23 Summary of discussion con't aggressive care Follow up goals of care discussion due: 12/30 Code Status: full Disposition: ICU   Total critical care time: 40  minutes   Critical care time was exclusive of separately billable procedures and treating other patients.   Critical care was necessary to treat or prevent imminent or life-threatening deterioration.   Critical care was time spent personally by me on the following activities: development of treatment plan with patient and/or surrogate as well as nursing, discussions with consultants, evaluation of patient's response to treatment, examination of patient, obtaining history from patient or surrogate, ordering and performing treatments and interventions, ordering and  review of laboratory studies, ordering and review of radiographic studies, pulse oximetry and re-evaluation of patient's condition.   Kipp Brood, MD [facility] ICU Physician [place]  Pager: [phone] Or Epic Secure Chat After hours: [phone].  09/30/2020, 4:02 PM

## 2020-09-30 NOTE — Procedures (Signed)
Seen and examined on CRRT.  Tolerating well.  Aggressive ultrafiltration.  Volume status improving.  Consider stopping tomorrow

## 2020-09-30 NOTE — Progress Notes (Signed)
Assisted tele visit to patient with family member.  Adena Sima Harold, RN  

## 2020-09-30 NOTE — Progress Notes (Signed)
CRITICAL VALUE ALERT  Critical Value:  cbg 57  Date & Time Notied:  1605  Provider Notified: R Agarwala  Orders Received/Actions taken: 1/2 amp d50.

## 2020-09-30 NOTE — Progress Notes (Signed)
Patient's Cardiohelp touch screen disabled, Cardiohelp needing reboot. Physician, Perfusion, ECMO Specialist, ECMO Coordinator, Primary Nurse and Respritory Therapist at beside. Emergency blood and equipment available. Oxygenator switched to hand crank mode at 0811am; patients O2 sats remained above 90%. Cardiohelp rebooted successfully. Patients oxygenator returned to Cardiohelp at 0816am. Patients O2 stats at 95%.

## 2020-09-30 NOTE — Procedures (Signed)
Extracorporeal support note     ECLS cannulation: 09/21/20 Last circuit change: 09/11/2020  Indication: COVD ARDS   Configuration: VV, RIJ 32Fr [place]   Pump speed: 3300 Pump flow: 4.0 L Pump used: Cardiohelp   Sweep gas: 100%, 9 LPM   Circuit check:  No clot noted Anticoagulant: bivalirudin   Changes in support: Tolerated brief interruption of ECMO for console reset.  Otherwise continuing same degree of support. anticipated goals/duration of support: bridge to recovery, day 24 of support.   Lynnell Catalan, MD [facility] ICU Physician [facility] [place] [facility]  Pager: [phone] Or Epic Secure Chat After hours: [phone].  09/30/2020, 4:00 PM

## 2020-09-30 NOTE — Progress Notes (Signed)
[facility] NEPHROLOGY PROGRESS NOTE  Assessment/ Plan: Pt is a 60 y.o. 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  # AKI, nonoliguric: due to ischemic ATN associted with covid-19 infection, IV contrast. Mainly started for volume overload w/ significant IV in requirements. Volume overload improved. Right femoral HD catheter placed 09/13/20 by PCCM.   Started CRRT on 12/27, all 4K bath, UF 50-100 cc/hour as tolerated by BP.  On Angiomax for anticoagulation. 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  LDH stable.   #Acute hypoxic respiratory failure/ARDS- secondary to covid-19 infection on VV ECMO. S/p trach 09/14/20.  # Bilateral lower extremity DVT's- on bivalirudin.  # HCAP/MRSA pneumonitis/tracheitis- on IV vancomycin.  # HTN/volume: On nicardipine, norvasc 31m daily, clonidine 0.229mTID, metop 2566mID.  Lasix for volume mgmt  Subjective: Continues to have good UOP. Tolerating CVVH.  Objective Vital signs in last 24 hours: Vitals:   09/30/20 0500 09/30/20 0600 09/30/20 0700 09/30/20 0758  BP: (!) 123/59 131/82 135/68 135/68  Pulse: (!) 110 (!) 116 (!) 117 (!) 115  Resp: (!) 24 (!) 27 (!) 31 (!) 39  Temp:      TempSrc:      SpO2: 95% 98% 96% 96%  Weight: 99.5 kg     Height:       Weight change: -6.5 kg  Intake/Output Summary (Last 24 hours) at 09/30/2020 0810 Last data filed at 09/30/2020 0701 Gross per 24 hour  Intake 3906.44 ml  Output 5381 ml  Net -1474.56 ml       Labs: Basic Metabolic Panel: Recent Labs  Lab 09/29/20 0316 09/29/20 0319 09/29/20 1610  09/29/20 1617 09/29/20 2044 09/30/20 0404 09/30/20 0415  NA 140   < > 138   < > 139 140 142  K 4.5   < > 5.0   < > 5.1 4.9 4.9  CL 106  --  105  --   --  106  --   CO2 26  --  25  --   --  24  --   GLUCOSE 143*  --  134*  --   --  73  --   BUN 40*  --  46*  --   --  49*  --   CREATININE 0.65  --  0.79  --   --  0.92  --   CALCIUM 8.4*  --  8.3*  --   --  8.2*  --   PHOS 2.4*  --  3.7  --   --  3.2  --    < > = values in this interval not displayed.   Liver Function Tests: Recent Labs  Lab 09/25/20 0400 09/25/20 1600 09/26/20 0403 09/26/20 1548 09/27/20 0339 09/27/20 1701 09/29/20 0316 09/29/20 1610 09/30/20 0404  AST 44*  --  49*  --  49*  --   --   --   --   ALT 59*  --  39  --  50*  --   --   --   --   ALKPHOS 113  --  112  --  138*  --   --   --   --  BILITOT 1.7*  --  1.7*  --  1.4*  --   --   --   --   PROT 4.5*  --  5.1*  --  5.4*  --   --   --   --   ALBUMIN 2.5*   < > 2.7*  2.6*   < > 2.8*  2.8*   < > 2.8* 3.0* 2.8*   < > = values in this interval not displayed.   No results for input(s): LIPASE, AMYLASE in the last 168 hours. Recent Labs  Lab 09/24/20 1128  AMMONIA 36*   CBC: Recent Labs  Lab 09/28/20 0302 09/28/20 0403 09/28/20 2000 09/28/20 2014 09/29/20 0316 09/29/20 0319 09/29/20 1610 09/29/20 1617 09/29/20 2044 09/30/20 0404 09/30/20 0415  WBC 5.7  --  9.8  --  9.2  --  10.2  --   --  10.4  --   NEUTROABS  --   --  6.0  --   --   --   --   --   --   --   --   HGB 11.4*  9.5*   < > 9.1*   < > 9.5*   < > 9.3*   < > 8.8* 8.5* 8.5*  HCT 34.3*  28.0*   < > 28.0*   < > 30.3*   < > 29.3*   < > 26.0* 25.1* 25.0*  MCV 95.0  --  94.6  --  96.5  --  95.4  --   --  95.4  --   PLT 80*  --  104*  --  105*  --  122*  --   --  119*  --    < > = values in this interval not displayed.   Cardiac Enzymes: No results for input(s): CKTOTAL, CKMB, CKMBINDEX, TROPONINI in the last 168 hours. CBG: Recent Labs  Lab 09/29/20 1115 09/29/20 1615  09/29/20 2043 09/29/20 2312 09/30/20 0415  GLUCAP 158* 141* 133* 123* 69*    Iron Studies: No results for input(s): IRON, TIBC, TRANSFERRIN, FERRITIN in the last 72 hours. Studies/Results: DG CHEST PORT 1 VIEW  Result Date: 09/29/2020 CLINICAL DATA:  ECMO.  Recent COVID EXAM: PORTABLE CHEST 1 VIEW COMPARISON:  09/28/2020 FINDINGS: Support devices remain in place, unchanged. Extensive bilateral airspace disease, unchanged. No visible effusions or pneumothorax. IMPRESSION: No change severe bilateral airspace disease. Electronically Signed   By: Rolm Baptise M.D.   On: 09/29/2020 07:33    Medications: Infusions: .  prismasol BGK 4/2.5 500 mL/hr at 09/30/20 0522  .  prismasol BGK 4/2.5 300 mL/hr at 09/30/20 0522  . sodium chloride 10 mL/hr at 09/20/20 1900  . albumin human    . albumin human 12.5 g (09/29/20 1644)  . bivalirudin (ANGIOMAX) infusion 0.5 mg/mL (Non-ACS indications) 0.03 mg/kg/hr (09/30/20 0701)  . ceFEPime (MAXIPIME) IV Stopped (09/30/20 0524)  . feeding supplement (PIVOT 1.5 CAL) 1,000 mL (09/29/20 1621)  . fentaNYL infusion INTRAVENOUS Stopped (09/28/20 1415)  . niCARDipine Stopped (09/30/20 0501)  . norepinephrine (LEVOPHED) Adult infusion Stopped (09/26/20 2335)  . prismasol BGK 4/2.5 1,500 mL/hr at 09/30/20 0522  . propofol (DIPRIVAN) infusion Stopped (09/28/20 0734)  . vancomycin Stopped (09/29/20 2113)    Scheduled Medications: . amLODipine  10 mg Per Tube QHS  . B-complex with vitamin C  1 tablet Per Tube Daily  . chlorhexidine gluconate (MEDLINE KIT)  15 mL Mouth Rinse BID  . Chlorhexidine Gluconate Cloth  6 each Topical Daily  .  clonazePAM  2 mg Per Tube Q8H  . cloNIDine  0.2 mg Per Tube TID  . docusate  100 mg Per Tube BID  . feeding supplement (PROSource TF)  45 mL Per Tube QID  . fiber  1 packet Per Tube BID  . insulin aspart  2-6 Units Subcutaneous Q4H  . insulin aspart  3 Units Subcutaneous Q4H  . insulin detemir  13 Units Subcutaneous BID  .  levothyroxine  50 mcg Per Tube Q0600  . mouth rinse  15 mL Mouth Rinse 10 times per day  . metoCLOPramide (REGLAN) injection  10 mg Intravenous Q6H  . metoprolol tartrate  50 mg Per Tube Q12H  . nystatin  5 mL Per Tube QID  . oxyCODONE  10 mg Per Tube Q6H  . pantoprazole sodium  40 mg Per Tube BID  . polyethylene glycol  17 g Per Tube BID  . pravastatin  40 mg Per Tube q1800  . QUEtiapine  150 mg Per Tube TID  . sodium chloride flush  10-40 mL Intracatheter Q12H    have reviewed scheduled and prn medications.  Physical Exam:  General: Ill-appearing, lying in bed, sedated Heart: Normal rate, no audible murmur Lungs: Coarse breath sound bilateral bilateral chest rise Abdomen:soft, Non-tender, non-distended Extremities: Pitting edema +, anasarca+, improving Dialysis Access: Right femoral HD catheter site clean.  Donald Rowe 09/30/2020,8:10 AM  LOS: 28 days

## 2020-09-30 NOTE — Progress Notes (Signed)
ANTICOAGULATION CONSULT NOTE  Pharmacy Consult for bivalirudin Indication: ECMO + bilateral DVTs + [facility] 12/26   Recent Labs    09/29/20 1610 09/29/20 1617 09/30/20 0404 09/30/20 0415 09/30/20 0756 09/30/20 1220 09/30/20 1550  HGB 9.3*   < > 8.5*   < > 8.8* 8.2* 8.8*  HCT 29.3*   < > 25.1*   < > 26.0* 24.0* 27.5*  PLT 122*  --  119*  --   --   --  151  APTT 42*  --  50*  --   --   --  51*  CREATININE 0.79  --  0.92  --   --   --  1.20   < > = values in this interval not displayed.    Estimated Creatinine Clearance: 77.1 mL/min (by C-G formula based on SCr of 1.2 mg/dL).   Assessment: 69 yom unvaccinated presenting with severe COVID ARDs, intubated on 12/6, continued to have ventilation issues despite NMB and proning - started on VV ECMO on 12/8. Pt noted to have bilateral DVTs 12/8. Bivalirudin started for ECMO anticoagulation. Head CT 12/26 with new small SAH, repeat 12/27 stable without expansion.  APTT= 51 on  0.034 mg/kg/hr  Goal of Therapy:  aPTT 50-70 seconds  Monitor platelets by anticoagulation protocol: Yes   Plan:  Increase bivalirudin to 0.037 mg/kg/hr. Recheck aPTT in 4 hours  Harland German, PharmD Clinical Pharmacist **Pharmacist phone directory can now be found on [email or web address] ([email or web address]).  Listed under [facility].

## 2020-09-30 DEATH — deceased

## 2020-10-01 ENCOUNTER — Inpatient Hospital Stay (HOSPITAL_COMMUNITY): Payer: HRSA Program

## 2020-10-01 DIAGNOSIS — U071 COVID-19: Secondary | ICD-10-CM | POA: Diagnosis not present

## 2020-10-01 DIAGNOSIS — Z515 Encounter for palliative care: Secondary | ICD-10-CM | POA: Diagnosis not present

## 2020-10-01 DIAGNOSIS — J80 Acute respiratory distress syndrome: Secondary | ICD-10-CM | POA: Diagnosis not present

## 2020-10-01 DIAGNOSIS — R0989 Other specified symptoms and signs involving the circulatory and respiratory systems: Secondary | ICD-10-CM

## 2020-10-01 DIAGNOSIS — E870 Hyperosmolality and hypernatremia: Secondary | ICD-10-CM | POA: Diagnosis not present

## 2020-10-01 DIAGNOSIS — R410 Disorientation, unspecified: Secondary | ICD-10-CM

## 2020-10-01 DIAGNOSIS — J9601 Acute respiratory failure with hypoxia: Secondary | ICD-10-CM | POA: Diagnosis not present

## 2020-10-01 LAB — POCT I-STAT 7, (LYTES, BLD GAS, ICA,H+H)
Acid-Base Excess: 0 mmol/L (ref 0.0–2.0)
Acid-Base Excess: 0 mmol/L (ref 0.0–2.0)
Acid-Base Excess: 0 mmol/L (ref 0.0–2.0)
Acid-Base Excess: 1 mmol/L (ref 0.0–2.0)
Acid-Base Excess: 0 mmol/L (ref 0.0–2.0)
Acid-Base Excess: 0 mmol/L (ref 0.0–2.0)
Acid-base deficit: 4 mmol/L — ABNORMAL HIGH (ref 0.0–2.0)
Acid-base deficit: 1 mmol/L (ref 0.0–2.0)
Acid-base deficit: 1 mmol/L (ref 0.0–2.0)
Bicarbonate: 25.6 mmol/L (ref 20.0–28.0)
Bicarbonate: 25.2 mmol/L (ref 20.0–28.0)
Bicarbonate: 21.4 mmol/L (ref 20.0–28.0)
Bicarbonate: 24.3 mmol/L (ref 20.0–28.0)
Bicarbonate: 24.9 mmol/L (ref 20.0–28.0)
Bicarbonate: 26.6 mmol/L (ref 20.0–28.0)
Bicarbonate: 27.3 mmol/L (ref 20.0–28.0)
Bicarbonate: 26.3 mmol/L (ref 20.0–28.0)
Bicarbonate: 25.9 mmol/L (ref 20.0–28.0)
Calcium, Ion: 1.32 mmol/L (ref 1.15–1.40)
Calcium, Ion: 1.27 mmol/L (ref 1.15–1.40)
Calcium, Ion: 1.22 mmol/L (ref 1.15–1.40)
Calcium, Ion: 1.27 mmol/L (ref 1.15–1.40)
Calcium, Ion: 1.3 mmol/L (ref 1.15–1.40)
Calcium, Ion: 1.22 mmol/L (ref 1.15–1.40)
Calcium, Ion: 1.32 mmol/L (ref 1.15–1.40)
Calcium, Ion: 1.32 mmol/L (ref 1.15–1.40)
Calcium, Ion: 1.3 mmol/L (ref 1.15–1.40)
HCT: 25 % — ABNORMAL LOW (ref 39.0–52.0)
HCT: 24 % — ABNORMAL LOW (ref 39.0–52.0)
HCT: 21 % — ABNORMAL LOW (ref 39.0–52.0)
HCT: 21 % — ABNORMAL LOW (ref 39.0–52.0)
HCT: 23 % — ABNORMAL LOW (ref 39.0–52.0)
HCT: 25 % — ABNORMAL LOW (ref 39.0–52.0)
HCT: 25 % — ABNORMAL LOW (ref 39.0–52.0)
HCT: 23 % — ABNORMAL LOW (ref 39.0–52.0)
HCT: 23 % — ABNORMAL LOW (ref 39.0–52.0)
Hemoglobin: 8.5 g/dL — ABNORMAL LOW (ref 13.0–17.0)
Hemoglobin: 8.2 g/dL — ABNORMAL LOW (ref 13.0–17.0)
Hemoglobin: 7.1 g/dL — ABNORMAL LOW (ref 13.0–17.0)
Hemoglobin: 7.1 g/dL — ABNORMAL LOW (ref 13.0–17.0)
Hemoglobin: 7.8 g/dL — ABNORMAL LOW (ref 13.0–17.0)
Hemoglobin: 8.5 g/dL — ABNORMAL LOW (ref 13.0–17.0)
Hemoglobin: 8.5 g/dL — ABNORMAL LOW (ref 13.0–17.0)
Hemoglobin: 7.8 g/dL — ABNORMAL LOW (ref 13.0–17.0)
Hemoglobin: 7.8 g/dL — ABNORMAL LOW (ref 13.0–17.0)
O2 Saturation: 95 %
O2 Saturation: 95 %
O2 Saturation: 95 %
O2 Saturation: 99 %
O2 Saturation: 98 %
O2 Saturation: 98 %
O2 Saturation: 99 %
O2 Saturation: 94 %
O2 Saturation: 97 %
Patient temperature: 36.7
Patient temperature: 36.9
Patient temperature: 36.9
Patient temperature: 36.9
Patient temperature: 36.8
Patient temperature: 36.8
Patient temperature: 37.3
Patient temperature: 36.7
Patient temperature: 96.8
Potassium: 4.6 mmol/L (ref 3.5–5.1)
Potassium: 4.6 mmol/L (ref 3.5–5.1)
Potassium: 4.1 mmol/L (ref 3.5–5.1)
Potassium: 4 mmol/L (ref 3.5–5.1)
Potassium: 4.2 mmol/L (ref 3.5–5.1)
Potassium: 4.4 mmol/L (ref 3.5–5.1)
Potassium: 4.5 mmol/L (ref 3.5–5.1)
Potassium: 4.6 mmol/L (ref 3.5–5.1)
Potassium: 4.3 mmol/L (ref 3.5–5.1)
Sodium: 140 mmol/L (ref 135–145)
Sodium: 141 mmol/L (ref 135–145)
Sodium: 142 mmol/L (ref 135–145)
Sodium: 141 mmol/L (ref 135–145)
Sodium: 139 mmol/L (ref 135–145)
Sodium: 142 mmol/L (ref 135–145)
Sodium: 140 mmol/L (ref 135–145)
Sodium: 139 mmol/L (ref 135–145)
Sodium: 139 mmol/L (ref 135–145)
TCO2: 27 mmol/L (ref 22–32)
TCO2: 26 mmol/L (ref 22–32)
TCO2: 23 mmol/L (ref 22–32)
TCO2: 26 mmol/L (ref 22–32)
TCO2: 26 mmol/L (ref 22–32)
TCO2: 28 mmol/L (ref 22–32)
TCO2: 29 mmol/L (ref 22–32)
TCO2: 28 mmol/L (ref 22–32)
TCO2: 27 mmol/L (ref 22–32)
pCO2 arterial: 46 mmHg (ref 32.0–48.0)
pCO2 arterial: 42.6 mmHg (ref 32.0–48.0)
pCO2 arterial: 38.5 mmHg (ref 32.0–48.0)
pCO2 arterial: 42.9 mmHg (ref 32.0–48.0)
pCO2 arterial: 41.3 mmHg (ref 32.0–48.0)
pCO2 arterial: 49 mmHg — ABNORMAL HIGH (ref 32.0–48.0)
pCO2 arterial: 56.8 mmHg — ABNORMAL HIGH (ref 32.0–48.0)
pCO2 arterial: 56.5 mmHg — ABNORMAL HIGH (ref 32.0–48.0)
pCO2 arterial: 48.9 mmHg — ABNORMAL HIGH (ref 32.0–48.0)
pH, Arterial: 7.353 (ref 7.350–7.450)
pH, Arterial: 7.379 (ref 7.350–7.450)
pH, Arterial: 7.351 (ref 7.350–7.450)
pH, Arterial: 7.361 (ref 7.350–7.450)
pH, Arterial: 7.388 (ref 7.350–7.450)
pH, Arterial: 7.342 — ABNORMAL LOW (ref 7.350–7.450)
pH, Arterial: 7.292 — ABNORMAL LOW (ref 7.350–7.450)
pH, Arterial: 7.274 — ABNORMAL LOW (ref 7.350–7.450)
pH, Arterial: 7.327 — ABNORMAL LOW (ref 7.350–7.450)
pO2, Arterial: 77 mmHg — ABNORMAL LOW (ref 83.0–108.0)
pO2, Arterial: 78 mmHg — ABNORMAL LOW (ref 83.0–108.0)
pO2, Arterial: 80 mmHg — ABNORMAL LOW (ref 83.0–108.0)
pO2, Arterial: 136 mmHg — ABNORMAL HIGH (ref 83.0–108.0)
pO2, Arterial: 108 mmHg (ref 83.0–108.0)
pO2, Arterial: 112 mmHg — ABNORMAL HIGH (ref 83.0–108.0)
pO2, Arterial: 143 mmHg — ABNORMAL HIGH (ref 83.0–108.0)
pO2, Arterial: 79 mmHg — ABNORMAL LOW (ref 83.0–108.0)
pO2, Arterial: 96 mmHg (ref 83.0–108.0)

## 2020-10-01 LAB — GLUCOSE, CAPILLARY
Glucose-Capillary: 105 mg/dL — ABNORMAL HIGH (ref 70–99)
Glucose-Capillary: 107 mg/dL — ABNORMAL HIGH (ref 70–99)
Glucose-Capillary: 240 mg/dL — ABNORMAL HIGH (ref 70–99)
Glucose-Capillary: 57 mg/dL — ABNORMAL LOW (ref 70–99)
Glucose-Capillary: 67 mg/dL — ABNORMAL LOW (ref 70–99)
Glucose-Capillary: 68 mg/dL — ABNORMAL LOW (ref 70–99)
Glucose-Capillary: 68 mg/dL — ABNORMAL LOW (ref 70–99)
Glucose-Capillary: 69 mg/dL — ABNORMAL LOW (ref 70–99)
Glucose-Capillary: 71 mg/dL (ref 70–99)
Glucose-Capillary: 82 mg/dL (ref 70–99)
Glucose-Capillary: 88 mg/dL (ref 70–99)
Glucose-Capillary: 91 mg/dL (ref 70–99)
Glucose-Capillary: 91 mg/dL (ref 70–99)
Glucose-Capillary: 91 mg/dL (ref 70–99)
Glucose-Capillary: 97 mg/dL (ref 70–99)

## 2020-10-01 LAB — CBC
HCT: 24.7 % — ABNORMAL LOW (ref 39.0–52.0)
HCT: 25 % — ABNORMAL LOW (ref 39.0–52.0)
Hemoglobin: 8 g/dL — ABNORMAL LOW (ref 13.0–17.0)
Hemoglobin: 8.1 g/dL — ABNORMAL LOW (ref 13.0–17.0)
MCH: 31.7 pg (ref 26.0–34.0)
MCH: 31.4 pg (ref 26.0–34.0)
MCHC: 32.4 g/dL (ref 30.0–36.0)
MCHC: 32.4 g/dL (ref 30.0–36.0)
MCV: 98 fL (ref 80.0–100.0)
MCV: 96.9 fL (ref 80.0–100.0)
Platelets: 121 10*3/uL — ABNORMAL LOW (ref 150–400)
Platelets: 135 10*3/uL — ABNORMAL LOW (ref 150–400)
RBC: 2.52 MIL/uL — ABNORMAL LOW (ref 4.22–5.81)
RBC: 2.58 MIL/uL — ABNORMAL LOW (ref 4.22–5.81)
RDW: 17.5 % — ABNORMAL HIGH (ref 11.5–15.5)
RDW: 17 % — ABNORMAL HIGH (ref 11.5–15.5)
WBC: 11.9 10*3/uL — ABNORMAL HIGH (ref 4.0–10.5)
WBC: 12.3 10*3/uL — ABNORMAL HIGH (ref 4.0–10.5)
nRBC: 0 % (ref 0.0–0.2)
nRBC: 0 % (ref 0.0–0.2)

## 2020-10-01 LAB — RENAL FUNCTION PANEL
Albumin: 3.2 g/dL — ABNORMAL LOW (ref 3.5–5.0)
Albumin: 3.2 g/dL — ABNORMAL LOW (ref 3.5–5.0)
Anion gap: 11 (ref 5–15)
Anion gap: 8 (ref 5–15)
BUN: 64 mg/dL — ABNORMAL HIGH (ref 6–20)
BUN: 66 mg/dL — ABNORMAL HIGH (ref 6–20)
CO2: 23 mmol/L (ref 22–32)
CO2: 25 mmol/L (ref 22–32)
Calcium: 9 mg/dL (ref 8.9–10.3)
Calcium: 8.9 mg/dL (ref 8.9–10.3)
Chloride: 106 mmol/L (ref 98–111)
Chloride: 106 mmol/L (ref 98–111)
Creatinine, Ser: 1.23 mg/dL (ref 0.61–1.24)
Creatinine, Ser: 1.24 mg/dL (ref 0.61–1.24)
GFR, Estimated: >60 mL/min (ref >60)
GFR, Estimated: >60 mL/min (ref >60)
Glucose, Bld: 69 mg/dL — ABNORMAL LOW (ref 70–99)
Glucose, Bld: 96 mg/dL (ref 70–99)
Phosphorus: 4.4 mg/dL (ref 2.5–4.6)
Phosphorus: 6.5 mg/dL — ABNORMAL HIGH (ref 2.5–4.6)
Potassium: 4.6 mmol/L (ref 3.5–5.1)
Potassium: 4.5 mmol/L (ref 3.5–5.1)
Sodium: 140 mmol/L (ref 135–145)
Sodium: 139 mmol/L (ref 135–145)

## 2020-10-01 LAB — APTT
aPTT: 57 seconds — ABNORMAL HIGH (ref 24–36)
aPTT: 55 seconds — ABNORMAL HIGH (ref 24–36)

## 2020-10-01 LAB — HEMOGLOBIN AND HEMATOCRIT, BLOOD
HCT: 23.5 % — ABNORMAL LOW (ref 39.0–52.0)
Hemoglobin: 7.7 g/dL — ABNORMAL LOW (ref 13.0–17.0)

## 2020-10-01 LAB — TRIGLYCERIDES: Triglycerides: 101 mg/dL (ref <150)

## 2020-10-01 LAB — LACTIC ACID, PLASMA
Lactic Acid, Venous: 0.4 mmol/L — ABNORMAL LOW (ref 0.5–1.9)
Lactic Acid, Venous: 0.4 mmol/L — ABNORMAL LOW (ref 0.5–1.9)

## 2020-10-01 LAB — MAGNESIUM: Magnesium: 3 mg/dL — ABNORMAL HIGH (ref 1.7–2.4)

## 2020-10-01 LAB — LACTATE DEHYDROGENASE: LDH: 562 U/L — ABNORMAL HIGH (ref 98–192)

## 2020-10-01 LAB — FIBRINOGEN: Fibrinogen: 471 mg/dL (ref 210–475)

## 2020-10-01 MED ORDER — LABETALOL HCL 5 MG/ML IV SOLN: 10.0000 mg | Freq: Once | INTRAVENOUS | Status: AC

## 2020-10-01 MED ORDER — FUROSEMIDE 10 MG/ML IJ SOLN
40.0000 mg | Freq: Two times a day (BID) | INTRAMUSCULAR | Status: AC
  Administered 2020-10-01 – 2020-10-02 (×4): 40 mg via INTRAVENOUS
  Filled 2020-10-01 (×4): qty 4

## 2020-10-01 NOTE — Progress Notes (Addendum)
[name] RN aware of order to d/c Right groin CVC.

## 2020-10-01 NOTE — Progress Notes (Signed)
Patient ID: Donald Rowe, male   DOB: 09-11-1961, 60 y.o.   MRN: [id number]    Advanced Heart Failure Rounding Note   Subjective:    12/8 Cannulated for VV ECMO - 32 FR  RIJ Crescent 12/9 Extubated/Reintubated for altered mental status 12/13 Circuit change for elevated LDH 12/15 PLEX started 12/16 Tracheostomy 12/23 head CT (normal) for change in mental status.  12/26 Pan CT for drop in hgb. Small SAH. Severe lung disease 12/27 CVVH begun 1/1 CVVH stopped  No PRBCs last night, pending CBC.     Off propofol, only getting Fentanyl.  Now with purposeful movement.   Ileus improved, trickle feeds restarted.   SBP 150s, currently off nicardipine.  Getting clonidine, amlodipine, and metoprolol.  I/Os even yesterday off CVVH but weight up some.   Had PLEX 4/4 completed 12/22.   ECMO   Speed 3385 Flow 4.45 L Sweep 7 dP 24 pVen -72  ABG: 7.38/43/78/95% Hgb 8 LDH 434 -> 544 -> 419 -> 551 -> 579 -> 590 -> 438 -> 518 -> 488 -> 514 -> 498 -> Pending Lactic acid 0.4 PTT 57  Vent 50%   Objective:   Weight Range:  Vital Signs:   Temp:  [97.9 F (36.6 C)-98.6 F (37 C)] 98.2 F (36.8 C) (01/02 0400) Pulse Rate:  [81-135] 127 (01/02 0738) Resp:  [0-57] 33 (01/02 0738) BP: (99-189)/(55-82) 189/75 (01/02 0738) SpO2:  [85 %-99 %] 96 % (01/02 0738) Arterial Line BP: (130-189)/(43-75) 183/75 (01/02 0700) FiO2 (%):  [50 %-100 %] 60 % (01/02 0738) Weight:  [101.2 kg] 101.2 kg (01/02 0500) Last BM Date: 09/30/20  Weight change: Filed Weights   09/29/20 0422 09/30/20 0500 10/01/20 0500  Weight: 106 kg 99.5 kg 101.2 kg    Intake/Output:   Intake/Output Summary (Last 24 hours) at 10/01/2020 0749 Last data filed at 10/01/2020 0700 Gross per 24 hour  Intake 1523.38 ml  Output 1050 ml  Net 473.38 ml     Physical Exam: General: NAD Neck: Tracheostomy. No JVD, no thyromegaly or thyroid nodule.  Lungs: Decreased at bases.  CV: Nondisplaced PMI.  Heart tachy, regular S1/S2,  no S3/S4, no murmur.  No peripheral edema.  N Abdomen: Soft, nontender, no hepatosplenomegaly, no distention.  Skin: Intact without lesions or rashes.  Neurologic: Now following commands Extremities: No clubbing or cyanosis.  HEENT: Normal.    Telemetry: sinus 120s Personally reviewed    Labs: Basic Metabolic Panel: Recent Labs  Lab 09/26/20 0403 09/26/20 0405 09/27/20 0339 09/27/20 0351 09/28/20 0301 09/28/20 0302 09/28/20 1600 09/28/20 2014 09/29/20 0315 09/29/20 0316 09/29/20 0319 09/29/20 1610 09/29/20 1617 09/30/20 0404 09/30/20 0415 09/30/20 1550 09/30/20 1556 09/30/20 2000 10/01/20 0518 10/01/20 0633  NA 142   < > 140   < >  --    < > 139   < >  --  140   < > 138   < > 140   < > 140 141 141 140 141  K 4.1   < > 4.3   < >  --    < > 5.2*   < >  --  4.5   < > 5.0   < > 4.9   < > 5.1 5.0 4.9 4.6 4.6  CL 109   < > 107   < >  --    < > 107  --   --  106  --  105  --  106  --  106  --   --   --   --  CO2 26   < > 26   < >  --    < > 25  --   --  26  --  25  --  24  --  24  --   --   --   --   GLUCOSE 96   < > 111*   < >  --    < > 135*  --   --  143*  --  134*  --  73  --  58*  --   --   --   --   BUN 70*   < > 50*   < >  --    < > 41*  --   --  40*  --  46*  --  49*  --  55*  --   --   --   --   CREATININE 0.92   < > 0.77   < >  --    < > 0.64  --   --  0.65  --  0.79  --  0.92  --  1.20  --   --   --   --   CALCIUM 7.8*   < > 7.9*   < >  --    < > 8.3*  --   --  8.4*  --  8.3*  --  8.2*  --  8.7*  --   --   --   --   MG 3.5*  --  3.1*  --  3.0*  --   --   --  3.1*  --   --   --   --  3.2*  --   --   --   --   --   --   PHOS 2.9   < > 3.4   < >  --    < > 3.0  --   --  2.4*  --  3.7  --  3.2  --  3.8  --   --   --   --    < > = values in this interval not displayed.    Liver Function Tests: Recent Labs  Lab 09/25/20 0400 09/25/20 1600 09/26/20 0403 09/26/20 1548 09/27/20 0339 09/27/20 1701 09/28/20 1600 09/29/20 0316 09/29/20 1610 09/30/20 0404  09/30/20 1550  AST 44*  --  49*  --  49*  --   --   --   --   --   --   ALT 59*  --  39  --  50*  --   --   --   --   --   --   ALKPHOS 113  --  112  --  138*  --   --   --   --   --   --   BILITOT 1.7*  --  1.7*  --  1.4*  --   --   --   --   --   --   PROT 4.5*  --  5.1*  --  5.4*  --   --   --   --   --   --   ALBUMIN 2.5*   < > 2.7*  2.6*   < > 2.8*  2.8*   < > 3.0* 2.8* 3.0* 2.8* 3.0*   < > = values in this interval not displayed.   No results for input(s): LIPASE, AMYLASE in the last 168 hours. Recent Labs  Lab  09/24/20 1128  AMMONIA 36*    CBC: Recent Labs  Lab 09/28/20 2000 09/28/20 2014 09/29/20 0316 09/29/20 0319 09/29/20 1610 09/29/20 1617 09/30/20 0404 09/30/20 0415 09/30/20 1550 09/30/20 1556 09/30/20 2000 10/01/20 0518 10/01/20 0633  WBC 9.8  --  9.2  --  10.2  --  10.4  --  14.7*  --   --   --   --   NEUTROABS 6.0  --   --   --   --   --   --   --   --   --   --   --   --   HGB 9.1*   < > 9.5*   < > 9.3*   < > 8.5*   < > 8.8* 8.8* 11.6* 8.5* 8.2*  HCT 28.0*   < > 30.3*   < > 29.3*   < > 25.1*   < > 27.5* 26.0* 34.0* 25.0* 24.0*  MCV 94.6  --  96.5  --  95.4  --  95.4  --  96.5  --   --   --   --   PLT 104*  --  105*  --  122*  --  119*  --  151  --   --   --   --    < > = values in this interval not displayed.    Cardiac Enzymes: No results for input(s): CKTOTAL, CKMB, CKMBINDEX, TROPONINI in the last 168 hours.  BNP: BNP (last 3 results) No results for input(s): BNP in the last 8760 hours.  ProBNP (last 3 results) No results for input(s): PROBNP in the last 8760 hours.    Other results:  Imaging: DG Abd 1 View  Result Date: 09/30/2020 CLINICAL DATA:  ECMO, vomiting EXAM: ABDOMEN - 1 VIEW COMPARISON:  Chest radiograph from earlier today. 09/27/2020 abdominal radiograph FINDINGS: Interval removal of enteric tube. Moderate gaseous distention of the stomach. Right common femoral central venous catheter terminates in the region of the right  common iliac vein. No dilated small bowel loops. No evidence of pneumatosis or pneumoperitoneum. Mild colonic gas and stool. IMPRESSION: Interval removal of enteric tube. Moderate gaseous distention of the stomach. Nonobstructive bowel gas pattern. Electronically Signed   By: Ilona Sorrel M.D.   On: 09/30/2020 09:06   DG CHEST PORT 1 VIEW  Result Date: 10/01/2020 CLINICAL DATA:  60 year old male COVID-34.  On ECMO. EXAM: PORTABLE CHEST 1 VIEW COMPARISON:  Portable chest 09/30/2020 and earlier. FINDINGS: Portable AP supine view at 0527 hours. Stable tracheostomy, ECMO cannula. Stable left PICC line. Enteric tube has been placed and courses to the stomach with tip not included. Interval resolved gas distended stomach in the visible abdomen. Diffuse bilateral pulmonary opacity, more confluent in both lungs since yesterday with increased obscuration of the mediastinum. No pneumothorax identified. No pleural effusion is evident. Paucity of bowel gas now in the upper abdomen. Stable visualized osseous structures. IMPRESSION: 1. Worsening, now severe bilateral pulmonary opacity. Obscured mediastinal contours. No pneumothorax. 2. Enteric tube placed and stomach decompressed. 3. Otherwise stable lines and tubes. Electronically Signed   By: Genevie Ann M.D.   On: 10/01/2020 07:37   DG CHEST PORT 1 VIEW  Result Date: 09/30/2020 CLINICAL DATA:  ECMO, vomiting EXAM: PORTABLE CHEST 1 VIEW COMPARISON:  Chest radiograph from one day prior. FINDINGS: Tracheostomy tube tip overlies the tracheal air column at the thoracic inlet. Left PICC terminates over the middle third of the SVC. Interval removal of enteric  tube. Stable ECMO catheter extending from the lower right neck into the medial upper right abdomen. Stable cardiomediastinal silhouette with normal heart size. No pneumothorax. Stable mild bilateral pleural thickening. Extensive patchy opacities throughout both lungs with low lung volumes and probable diffuse mild  bronchiectasis, not substantially changed. IMPRESSION: 1. Support structures as detailed. No pneumothorax. 2. Stable extensive patchy opacities throughout both lungs with low lung volumes and probable diffuse mild bronchiectasis. Electronically Signed   By: Ilona Sorrel M.D.   On: 09/30/2020 08:48   DG Abd Portable 1V  Result Date: 09/30/2020 CLINICAL DATA:  Gastric tube placement EXAM: PORTABLE ABDOMEN - 1 VIEW COMPARISON:  Chest x-ray obtained earlier today FINDINGS: The tip of the gastric tube is in good position overlying the gastric antrum. Incompletely imaged ECMO tube. No significant gaseous dilation. IMPRESSION: Well-positioned gastric tube. Electronically Signed   By: Jacqulynn Cadet M.D.   On: 09/30/2020 10:07     Medications:     Scheduled Medications: . amLODipine  10 mg Per Tube QHS  . B-complex with vitamin C  1 tablet Per Tube Daily  . chlorhexidine gluconate (MEDLINE KIT)  15 mL Mouth Rinse BID  . Chlorhexidine Gluconate Cloth  6 each Topical Daily  . clonazePAM  2 mg Per Tube Q8H  . cloNIDine  0.2 mg Per Tube TID  . docusate  100 mg Per Tube BID  . feeding supplement (PROSource TF)  45 mL Per Tube QID  . fiber  1 packet Per Tube BID  . furosemide  40 mg Intravenous BID  . insulin aspart  2-6 Units Subcutaneous Q4H  . insulin aspart  3 Units Subcutaneous Q4H  . insulin detemir  13 Units Subcutaneous BID  . levothyroxine  50 mcg Per Tube Q0600  . mouth rinse  15 mL Mouth Rinse 10 times per day  . metoCLOPramide (REGLAN) injection  10 mg Intravenous Q6H  . metoprolol tartrate  50 mg Per Tube Q12H  . nystatin  5 mL Per Tube QID  . oxyCODONE  10 mg Per Tube Q6H  . pantoprazole sodium  40 mg Per Tube BID  . polyethylene glycol  17 g Per Tube BID  . pravastatin  40 mg Per Tube q1800  . QUEtiapine  150 mg Per Tube TID  . sodium chloride flush  10-40 mL Intracatheter Q12H    Infusions: . sodium chloride 250 mL (09/30/20 1447)  . albumin human 12.5 g (09/29/20 1644)   . bivalirudin (ANGIOMAX) infusion 0.5 mg/mL (Non-ACS indications) 0.037 mg/kg/hr (10/01/20 0502)  . feeding supplement (PIVOT 1.5 CAL) 1,000 mL (09/30/20 1832)  . fentaNYL infusion INTRAVENOUS 100 mcg/hr (10/01/20 0502)  . niCARDipine Stopped (09/30/20 7673)  . prismasol BGK 4/2.5 Stopped (09/30/20 0900)    PRN Medications: acetaminophen (TYLENOL) oral liquid 160 mg/5 mL, albumin human, albuterol, diphenhydrAMINE, fentaNYL, Gerhardt's butt cream, haloperidol lactate, heparin, labetalol, lip balm, LORazepam, ondansetron **OR** ondansetron (ZOFRAN) IV, phenylephrine, sodium chloride flush   Assessment/Plan:   1. Acute hypoxic respiratory failure/ARDS in setting of COVID-19 PNA - Cannulated for VV ECMO on 12/8 after failing intubation x 2 days - has complete remedisivir, baricitinib, Solumedrol.  - s/p trach 12/16 - CT chest 12/26 with severe lung disease and pneumomediastinum.  - Restarted vanc/cefepime 12/26 => now completed course.  - Circuit changed 12/13 for elevated LDH and lower flows. PLEX started 12/15. S/p PLEX #4/4 on 12/21. LDH has been stable but pending today. Circuit parameters stable.   - Sweep lower at 7 today,  continue slow wean.   - CVVH started 12/27 with intractable volume overload, stopped 1/1.  Pending BMET today.  I/Os even with weight now rising => give Lasix 40 mg IV bid today.   - Bivalirudin with PTT goal back up to 50-70.  - Mental status seems to be improving. Now off propofol with purposeful movement.    2. Bilateral DVT - remains on bivalirudin - Dosing d/w PharmD, increased PTT goal back to 50-70.   3. DM2 - Insulin adjusted. CBGs improved   4. Obesity -Body mass index is 33.73 kg/m.  5. F/E/N - Restart trickle feeds, ileus improved.   6. HTN - Remains very labile  - Now off nicardipine and getting amlodipine, clonidine, and metoprolol.   7. Rectal bleeding - follow closely. Rectal tube removed  8. AKI - Now off CVVH, pending BMET.    9. Acute encephalopathy - remains agitated  - head CT ok 09/07/20 and 12/21 - head CT 12/26. Small SAH.  Repeat CT on 12/27 with unchanged small SAH.   10. Small SAH - on CT 12/26 - repeat CT 12/27 unchanged small SAH.  - PTT goal reduced 40-50 for several days, plan to go back to 50-70 today.  - avoid severe HTN as best as possible  11. Gastric ileus - Getting Reglan IV - Seems to be resolving.  - Trickle feeds restarted.    12. Thrombocytopenia - Stable, mild.   13. Anemia - Transfuse hgb < 8.  - Has been getting periodic blood without overt GI bleeding.   Plan discussed on multi-discplinary ECMO rounds with CCM, ECMO coordinator/specialist, PharmDs and RNs.  CRITICAL CARE Performed by: Loralie Champagne  Total critical care time: 40 minutes  Critical care time was exclusive of separately billable procedures and treating other patients.  Critical care was necessary to treat or prevent imminent or life-threatening deterioration.  Critical care was time spent personally by me (independent of midlevel providers or residents) on the following activities: development of treatment plan with patient and/or surrogate as well as nursing, discussions with consultants, evaluation of patient's response to treatment, examination of patient, obtaining history from patient or surrogate, ordering and performing treatments and interventions, ordering and review of laboratory studies, ordering and review of radiographic studies, pulse oximetry and re-evaluation of patient's condition.    Length of Stay: 76   Loralie Champagne MD 10/01/2020, 7:49 AM  Advanced Heart Failure Team Pager [phone] (M-F; 7a - 4p)  Please contact [facility] for night-coverage after hours (4p -7a ) and weekends on [email or web address]

## 2020-10-01 NOTE — Progress Notes (Signed)
Assisted tele visit to patient with family member.  Seraphim Affinito Harold, RN  

## 2020-10-01 NOTE — Progress Notes (Signed)
   Palliative Medicine Inpatient Follow Up Note  Reason for consult:  "ECMO"  HPI:  Per intake H&P --> Donald Rowe a 59 y.o.malewith a pertinent history ofhypothyroidism on levothyroxine and hyperlipidemia who was diagnosed with Covid 1 week ago and is not vaccinated. With EMS he was seen to be desaturating to 78% on room air. He states he started having symptoms on Friday and tested positive on Saturday.  Had initially elected to be DNAR without intubation though later changed this and got intubated on 12/6  in the setting of worsening respiratory failure with hypoxia. CTA chest w/ diffuse bilateral infiltrates. On 12/8 was cannulated for VV ECMO. Had been extubated on 12/9 then emergently reintubated in early evening due to clinical instability.   Palliative care was asked to get involved in the setting of VV ECMO to set goals and expectations with [name]'s family.  Today's Discussion (10/01/2020): Chart reviewed.  Attended ECMO rounds.  From a cognitive stand point able to intermittently follow commands such as sticking his tongue out. He does appear to have continued e/o agitation.   NGT helping with decompression trickle feeding started per CCM. Edema had been improving gradually had been on CVVH since 12/27 1/1. This morning he sounds more wet on auscultation - diuresis ordered per cardiology team. Hgb 7.1 today. Patient remains to have complicated ICU associated delirium on fentanyl gtt and Seroquel.   Patients wife, [name] updated via telephone. She had some questions about patients low blood sugar yesterday and when we would restart full tube feedings. We discussed the gradual increase in feeds. Also discussed patients present Hgb level. All questions answered to a satisfactory level.   I shared with [name] that I will not be back tomorrow nor will [name] though we will request one of our PMT colleagues follows along with [name] throughout the week. She was thankful for  this.   SUMMARY OF RECOMMENDATIONS Full Code/Full Scope of Care  ECMO as a bridge to recovery  Spiritual support appreciated  Ongoing PMT support    Time Spent: 25 Greater than 50% of the time was spent in counseling and coordination of care ______________________________________________________________________________________ [name] [facility] Palliative Medicine Team Team Cell Phone: [phone] Please utilize secure chat with additional questions, if there is no response within 30 minutes please call the above phone number  Palliative Medicine Team providers are available by phone from 7am to 7pm daily and can be reached through the team cell phone.  Should this patient require assistance outside of these hours, please call the patient's attending physician.

## 2020-10-01 NOTE — Progress Notes (Addendum)
ANTICOAGULATION CONSULT NOTE  Pharmacy Consult for bivalirudin Indication: ECMO + bilateral DVTs + [facility] 12/26   Recent Labs    09/30/20 1550 09/30/20 1556 09/30/20 2300 10/01/20 0508 10/01/20 0518 10/01/20 1108 10/01/20 1229 10/01/20 1556  HGB 8.8*   < >  --  8.0*   < > 7.8* 8.5* 8.5*  8.1*  HCT 27.5*   < >  --  24.7*   < > 23.0* 25.0* 25.0*  25.0*  PLT 151  --   --  121*  --   --   --  135*  APTT 51*  --  51* 57*  --   --   --  55*  CREATININE 1.20  --   --  1.23  --   --   --  1.24   < > = values in this interval not displayed.    Estimated Creatinine Clearance: 75.2 mL/min (by C-G formula based on SCr of 1.24 mg/dL).   Assessment: 11 yom unvaccinated presenting with severe COVID ARDs, intubated on 12/6, continued to have ventilation issues despite NMB and proning - started on VV ECMO on 12/8. Pt noted to have bilateral DVTs 12/8. Bivalirudin started for ECMO anticoagulation. Head CT 12/26 with new small SAH, repeat 12/27 stable without expansion.  APTT= 55 on  0.04 mg/kg/hr  Goal of Therapy:  aPTT 50-70 seconds  Monitor platelets by anticoagulation protocol: Yes   Plan:  Continue bivalirudin at 0.04 mg/kg/hr Recheck aPTTs every 5a & 5p with ECMO labs.  Harland German, PharmD Clinical Pharmacist **Pharmacist phone directory can now be found on [email or web address] ([email or web address]).  Listed under [facility].

## 2020-10-01 NOTE — Progress Notes (Addendum)
NAME:  Donald Rowe, MRN:  [id number], DOB:  December 16, 1960, LOS: 75 ADMISSION DATE:  09/26/2020, CONSULTATION DATE:  12/6 REFERRING MD:  Dr. Aileen Fass, CHIEF COMPLAINT:  SOB    Brief History   60 y/o M admitted 12/4 with 1 week hx of SOB, known COVID positive.  He is unvaccinated.  CTA chest negative for PE but demonstrated diffuse bilateral infiltrates. [place] started on 12/8   Past Medical History  Hypothyroidism  [place] [facility] Events/Procedures  12/04 Admit  12/06 PCCM consulted  12/07 Intubated, central line, a line 12/08 VV ECMO Cannulaton, cortrak 12/13 circuit change for hemolytic anemia 12/16 Tracheostomy  12/17 bronch w/ BAL  Consults:  PCCM, Heart failure, TCTS, ECMO  Significant Diagnostic Tests:   CTA Chest 12/4 >> extensive bilateral airspace disease, no large PE identified, limited study   LE Venous Duplex 12/4 >> negative for DVT bilaterally   LE venous duplex 12/8> BLE DVTs  RUE venous duplex 12/23  RUE arterial duplex 12/23  CT head 12/26 : Minimal SAH on left side  CT abdomen and pelvis 12/26: Extensive bilateral airspace disease with small pleural effusion  CT head 12/27: Subarachnoid unchanged  Chest x-ray 1/1: Significant improvement in airspace disease  Micro Data:  COVID 12/4 >> negative  Influenza A/B 12/4 >> negative  MRSA PCR 12/5 >> negative  BCx2 12/4 >> NG Trach aspirate 12/12> rare MRSA BAL 12/17> MRSA, candida Aspergillus Ag BAL 12/17> 0.05 12/18 sars2: positive  Antimicrobials:  Azithromycin 12/5 >> 12/6  Ceftriaxone 12/5 >> 12/6  Cefepime 12/7>12/14 vanc 12/14>12/20 Meropenem 12/17>12/21  Interim history/subjective:   Less pronounced BP swings. More interactive and has followed commands intermittently. CRRT stopped.   Objective   Blood pressure 139/85, pulse 94, temperature 98.4 F (36.9 C), temperature source Core, resp. rate 19, height _0  (1.753 m), weight 101.2 kg, SpO2 98 %.    Vent Mode:  PCV FiO2 (%):  [60 %] 60 % Set Rate:  [12 bmp] 12 bmp PEEP:  [10 cmH20] 10 cmH20   Intake/Output Summary (Last 24 hours) at 10/01/2020 1128 Last data filed at 10/01/2020 1100 Gross per 24 hour  Intake 2064.03 ml  Output 1725 ml  Net 339.03 ml   Filed Weights   09/29/20 0422 09/30/20 0500 10/01/20 0500  Weight: 106 kg 99.5 kg 101.2 kg    Examination: Gen: Well-developed, acutely ill-appearing obese male, lying on the bed  HEENT: NCAT, perrla, s/p trach Neck: supple, ECMO lines intact and functioning CV: Tachycardic, regular rhythm, no murmur  Pulm: Bilateral crackles, no wheezes or rhonchi, improved air entry bilaterally down to posterior axillary line, much improved from earlier this week Abd: Soft, BS+, NTND Extm: Peripheral pulses intact 2+ radial pulse on RUE, 1+ edema - much improved.  Skin: Dry, Warm,  Neuro: Eyes open but not following commands.  More purposeful movements.   Resolved Hospital Problem list     Assessment & Plan:   Acute hypoxemic and hypercarbic respiratory failure secondary to COVID PNA with severe ARDS. S/p VV ECMO cannulation 09/29/2020.  S/p tracheostomy 93/81 complicated by trach site bleeding improved Trivial left-sided subarachnoid hemorrhage Community-acquired pneumonia with MRSA Completed remdesivir.  Completed baricitinib.  c/w full ECMO support Solu-Medrol stopped Completed for treatment with 4 sessions of Plex Furosemide BID for fluid removal   Agitated delirium requiring titration of IV sedation.  Periods of severe agitation are limiting ability to wean ECMO Keep all sedative infusions off Continue enteral seroquel, oxycodone and clonazepam Steroids  are off.  Seems to be helping delirium  Bilateral lower ext DVTs- provoked by COVID infection Continue bivalirudin,.   Steroid-induced hyperglycemia Fingersticks are better controlled now Decrease Levemir with steroid wean. Continue TF coverage (but decreasing) and SSI  PRN  Hypothyroidism con't PTA synthroid   Labile hypertension, contributed by periods of agitation.  Patient blood pressure appears less labile now that we are not actively titrating IV sedatives Currently on clonidine, hydralazine and metoprolol Titrate Cardene as necessary    Hemolytic Anemia appears resolved Throbocytopenia Hyperbilirubinemia Rheumatologic-associated MAHA remains in differential.  hgb and plt are stable Completed treatment with the Plex for 4 sessions Hemoglobin has been stable in the last 24 hours Transfuse if hgb <8  Best practice (evaluated daily)  Diet: TF Pain/Anxiety/Delirium protocol (if indicated): Keep off infusions.  Use narcotics if necessary. VAP protocol (if indicated): yes DVT prophylaxis: Bivalirudin GI prophylaxis: pantoprazole  BID Glucose control: basal-bolus Mobility: As tolerated Last date of multidisciplinary goals of care discussion: wife updated over the phone 12/23 Summary of discussion con't aggressive care Follow up goals of care discussion due: 12/30 Code Status: full Disposition: ICU   Total critical care time: 40  minutes   Critical care time was exclusive of separately billable procedures and treating other patients.   Critical care was necessary to treat or prevent imminent or life-threatening deterioration.   Critical care was time spent personally by me on the following activities: development of treatment plan with patient and/or surrogate as well as nursing, discussions with consultants, evaluation of patient's response to treatment, examination of patient, obtaining history from patient or surrogate, ordering and performing treatments and interventions, ordering and review of laboratory studies, ordering and review of radiographic studies, pulse oximetry and re-evaluation of patient's condition.   Kipp Brood, MD [facility] ICU Physician [address]  Pager: [phone] Or Epic Secure Chat After hours:  [phone].  10/01/2020, 11:28 AM

## 2020-10-01 NOTE — Progress Notes (Signed)
Dr Denese Killings aware of redraw H/H of 707/23.5.  No new orders.  Will continue to closely monitor.

## 2020-10-01 NOTE — Progress Notes (Signed)
[facility] NEPHROLOGY PROGRESS NOTE  Assessment/ Plan: Pt is a 60 y.o. 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  # AKI, nonoliguric: due to ischemic ATN associted with covid-19 infection, IV contrast. Mainly started for volume overload w/ significant IV in requirements. Volume overload improved now off CV. Right femoral HD catheter placed 09/13/20 by PCCM.   Started CRRT on 12/27, all 4K bath, UF 50-100 cc/hour as tolerated by BP.  On Angiomax for anticoagulation.  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  LDH stable.   #Acute hypoxic respiratory failure/ARDS- secondary to covid-19 infection on VV ECMO. S/p trach 09/14/20.  # Bilateral lower extremity DVT's- on bivalirudin.  # HCAP/MRSA pneumonitis/tracheitis- abx per primary team  # HTN/volume: Off nicardipine on multiple oral agents. Acceptable currently  Nephrology will sign off at this time.  If further assistance is needed please contact on-call nephrology for further help.  Subjective: Continues to have good UOP. CRRT stopped yesterday. Volume status much improved.  Objective Vital signs in last 24 hours: Vitals:   10/01/20 0500 10/01/20 0600 10/01/20 0700 10/01/20 0738  BP: (!) 147/76 (!) 153/77 (!) 165/68 (!) 189/75  Pulse: (!) 117 (!) 102 (!) 125 (!) 127  Resp: (!) 26 (!) 21 (!) 23 (!) 33  Temp:      TempSrc:      SpO2: 95% 95% 97% 96%  Weight: 101.2 kg     Height:       Weight change: 1.7 kg  Intake/Output Summary (Last 24 hours) at 10/01/2020 0814 Last data filed at 10/01/2020 0700 Gross per 24 hour   Intake 1517.27 ml  Output 1050 ml  Net 467.27 ml       Labs: Basic Metabolic Panel: Recent Labs  Lab 09/29/20 1610 09/29/20 1617 09/30/20 0404 09/30/20 0415 09/30/20 1550 09/30/20 1556 09/30/20 2000 10/01/20 0518 10/01/20 0633  NA 138   < > 140   < > 140   < > 141 140 141  K 5.0   < > 4.9   < > 5.1   < > 4.9 4.6 4.6  CL 105  --  106  --  106  --   --   --   --   CO2 25  --  24  --  24  --   --   --   --   GLUCOSE 134*  --  73  --  58*  --   --   --   --   BUN 46*  --  49*  --  55*  --   --   --   --   CREATININE 0.79  --  0.92  --  1.20  --   --   --   --   CALCIUM 8.3*  --  8.2*  --  8.7*  --   --   --   --   PHOS 3.7  --  3.2  --  3.8  --   --   --   --    < > = values in this interval not displayed.   Liver Function Tests: Recent Labs  Lab 09/25/20 0400 09/25/20 1600  09/26/20 0403 09/26/20 1548 09/27/20 0339 09/27/20 1701 09/29/20 1610 09/30/20 0404 09/30/20 1550  AST 44*  --  49*  --  49*  --   --   --   --   ALT 59*  --  39  --  50*  --   --   --   --   ALKPHOS 113  --  112  --  138*  --   --   --   --   BILITOT 1.7*  --  1.7*  --  1.4*  --   --   --   --   PROT 4.5*  --  5.1*  --  5.4*  --   --   --   --   ALBUMIN 2.5*   < > 2.7*  2.6*   < > 2.8*  2.8*   < > 3.0* 2.8* 3.0*   < > = values in this interval not displayed.   No results for input(s): LIPASE, AMYLASE in the last 168 hours. Recent Labs  Lab 09/24/20 1128  AMMONIA 36*   CBC: Recent Labs  Lab 09/28/20 2000 09/28/20 2014 09/29/20 0316 09/29/20 0319 09/29/20 1610 09/29/20 1617 09/30/20 0404 09/30/20 0415 09/30/20 1550 09/30/20 1556 10/01/20 0508 10/01/20 0518 10/01/20 0633  WBC 9.8  --  9.2  --  10.2  --  10.4  --  14.7*  --  11.9*  --   --   NEUTROABS 6.0  --   --   --   --   --   --   --   --   --   --   --   --   HGB 9.1*   < > 9.5*   < > 9.3*   < > 8.5*   < > 8.8*   < > 8.0* 8.5* 8.2*  HCT 28.0*   < > 30.3*   < > 29.3*   < > 25.1*   < > 27.5*   < > 24.7* 25.0* 24.0*   MCV 94.6  --  96.5  --  95.4  --  95.4  --  96.5  --  98.0  --   --   PLT 104*  --  105*  --  122*  --  119*  --  151  --  121*  --   --    < > = values in this interval not displayed.   Cardiac Enzymes: No results for input(s): CKTOTAL, CKMB, CKMBINDEX, TROPONINI in the last 168 hours. CBG: Recent Labs  Lab 09/30/20 1952 09/30/20 2036 09/30/20 2351 10/01/20 0334 10/01/20 0753  GLUCAP 67* 97 107* 82 69*    Iron Studies: No results for input(s): IRON, TIBC, TRANSFERRIN, FERRITIN in the last 72 hours. Studies/Results: DG Abd 1 View  Result Date: 09/30/2020 CLINICAL DATA:  ECMO, vomiting EXAM: ABDOMEN - 1 VIEW COMPARISON:  Chest radiograph from earlier today. 09/27/2020 abdominal radiograph FINDINGS: Interval removal of enteric tube. Moderate gaseous distention of the stomach. Right common femoral central venous catheter terminates in the region of the right common iliac vein. No dilated small bowel loops. No evidence of pneumatosis or pneumoperitoneum. Mild colonic gas and stool. IMPRESSION: Interval removal of enteric tube. Moderate gaseous distention of the stomach. Nonobstructive bowel gas pattern. Electronically Signed   By: Ilona Sorrel M.D.   On: 09/30/2020 09:06   DG CHEST PORT 1 VIEW  Result Date: 10/01/2020 CLINICAL DATA:  60 year old male COVID-39.  On ECMO. EXAM: PORTABLE CHEST  1 VIEW COMPARISON:  Portable chest 09/30/2020 and earlier. FINDINGS: Portable AP supine view at 0527 hours. Stable tracheostomy, ECMO cannula. Stable left PICC line. Enteric tube has been placed and courses to the stomach with tip not included. Interval resolved gas distended stomach in the visible abdomen. Diffuse bilateral pulmonary opacity, more confluent in both lungs since yesterday with increased obscuration of the mediastinum. No pneumothorax identified. No pleural effusion is evident. Paucity of bowel gas now in the upper abdomen. Stable visualized osseous structures. IMPRESSION: 1. Worsening, now  severe bilateral pulmonary opacity. Obscured mediastinal contours. No pneumothorax. 2. Enteric tube placed and stomach decompressed. 3. Otherwise stable lines and tubes. Electronically Signed   By: Genevie Ann M.D.   On: 10/01/2020 07:37   DG CHEST PORT 1 VIEW  Result Date: 09/30/2020 CLINICAL DATA:  ECMO, vomiting EXAM: PORTABLE CHEST 1 VIEW COMPARISON:  Chest radiograph from one day prior. FINDINGS: Tracheostomy tube tip overlies the tracheal air column at the thoracic inlet. Left PICC terminates over the middle third of the SVC. Interval removal of enteric tube. Stable ECMO catheter extending from the lower right neck into the medial upper right abdomen. Stable cardiomediastinal silhouette with normal heart size. No pneumothorax. Stable mild bilateral pleural thickening. Extensive patchy opacities throughout both lungs with low lung volumes and probable diffuse mild bronchiectasis, not substantially changed. IMPRESSION: 1. Support structures as detailed. No pneumothorax. 2. Stable extensive patchy opacities throughout both lungs with low lung volumes and probable diffuse mild bronchiectasis. Electronically Signed   By: Ilona Sorrel M.D.   On: 09/30/2020 08:48   DG Abd Portable 1V  Result Date: 09/30/2020 CLINICAL DATA:  Gastric tube placement EXAM: PORTABLE ABDOMEN - 1 VIEW COMPARISON:  Chest x-ray obtained earlier today FINDINGS: The tip of the gastric tube is in good position overlying the gastric antrum. Incompletely imaged ECMO tube. No significant gaseous dilation. IMPRESSION: Well-positioned gastric tube. Electronically Signed   By: Jacqulynn Cadet M.D.   On: 09/30/2020 10:07    Medications: Infusions: . sodium chloride 250 mL (09/30/20 1447)  . albumin human 12.5 g (09/29/20 1644)  . bivalirudin (ANGIOMAX) infusion 0.5 mg/mL (Non-ACS indications) 0.037 mg/kg/hr (10/01/20 0502)  . feeding supplement (PIVOT 1.5 CAL) 1,000 mL (09/30/20 1832)  . fentaNYL infusion INTRAVENOUS 100 mcg/hr (10/01/20  0502)  . niCARDipine Stopped (09/30/20 0086)  . prismasol BGK 4/2.5 Stopped (09/30/20 0900)    Scheduled Medications: . amLODipine  10 mg Per Tube QHS  . B-complex with vitamin C  1 tablet Per Tube Daily  . chlorhexidine gluconate (MEDLINE KIT)  15 mL Mouth Rinse BID  . Chlorhexidine Gluconate Cloth  6 each Topical Daily  . clonazePAM  2 mg Per Tube Q8H  . cloNIDine  0.2 mg Per Tube TID  . docusate  100 mg Per Tube BID  . feeding supplement (PROSource TF)  45 mL Per Tube QID  . fiber  1 packet Per Tube BID  . furosemide  40 mg Intravenous BID  . insulin aspart  2-6 Units Subcutaneous Q4H  . insulin aspart  3 Units Subcutaneous Q4H  . insulin detemir  13 Units Subcutaneous BID  . levothyroxine  50 mcg Per Tube Q0600  . mouth rinse  15 mL Mouth Rinse 10 times per day  . metoCLOPramide (REGLAN) injection  10 mg Intravenous Q6H  . metoprolol tartrate  50 mg Per Tube Q12H  . nystatin  5 mL Per Tube QID  . oxyCODONE  10 mg Per Tube Q6H  .  pantoprazole sodium  40 mg Per Tube BID  . polyethylene glycol  17 g Per Tube BID  . pravastatin  40 mg Per Tube q1800  . QUEtiapine  150 mg Per Tube TID  . sodium chloride flush  10-40 mL Intracatheter Q12H    have reviewed scheduled and prn medications.  Physical Exam:  General: Ill-appearing, lying in bed, sedated Heart: Normal rate, no audible murmur Lungs: Coarse breath sound bilateral bilateral chest rise Abdomen:soft, Non-tender, non-distended Extremities: Pitting edema +, anasarca+, improving Dialysis Access: Right femoral HD catheter site clean.  Shaune Pollack Nonnie Pickney 10/01/2020,8:14 AM  LOS: 29 days

## 2020-10-01 NOTE — Procedures (Signed)
Extracorporeal support note     ECLS cannulation: 09/16/2020 Last circuit change: 09/11/2020  Indication: COVD ARDS   Configuration: VV, RIJ 32Fr [place]   Pump speed: 3300 Pump flow: 4.4 L Pump used: Cardiohelp   Sweep gas: 100%, 9 LPM   Circuit check:  No clot noted Anticoagulant: bivalirudin   Changes in support: Tolerated brief interruption of ECMO for console reset.  Otherwise continuing same degree of support. anticipated goals/duration of support: bridge to recovery, day 25 of support.   Lynnell Catalan, MD [facility] ICU Physician [facility]  Pager: [phone] Or Epic Secure Chat After hours: [phone].  10/01/2020, 11:32 AM

## 2020-10-01 NOTE — Progress Notes (Signed)
ANTICOAGULATION CONSULT NOTE  Pharmacy Consult for bivalirudin Indication: ECMO + bilateral DVTs + [facility] 12/26   Recent Labs    09/30/20 0404 09/30/20 0415 09/30/20 1550 09/30/20 1556 09/30/20 2300 10/01/20 0508 10/01/20 0518 10/01/20 0633 10/01/20 0756  HGB 8.5*   < > 8.8*   < >  --  8.0* 8.5* 8.2* 7.1*  HCT 25.1*   < > 27.5*   < >  --  24.7* 25.0* 24.0* 21.0*  PLT 119*  --  151  --   --  121*  --   --   --   APTT 50*  --  51*  --  51* 57*  --   --   --   CREATININE 0.92  --  1.20  --   --  1.23  --   --   --    < > = values in this interval not displayed.    Estimated Creatinine Clearance: 75.8 mL/min (by C-G formula based on SCr of 1.23 mg/dL).   Assessment: 87 yom unvaccinated presenting with severe COVID ARDs, intubated on 12/6, continued to have ventilation issues despite NMB and proning - started on VV ECMO on 12/8. Pt noted to have bilateral DVTs 12/8. Bivalirudin started for ECMO anticoagulation. Head CT 12/26 with new small SAH, repeat 12/27 stable without expansion.  APTT= 57 on  0.037 mg/kg/hr  Goal of Therapy:  aPTT 50-70 seconds  Monitor platelets by anticoagulation protocol: Yes   Plan:  Increase bivalirudin just slightly to  0.04 mg/kg/hr. Recheck aPTTs every 5a & 5p with ECMO labs.  Reece Leader, Colon Flattery, BCCP Clinical Pharmacist  10/01/2020 9:26 AM   [facility] pharmacy phone numbers are listed on [email or web address]

## 2020-10-02 ENCOUNTER — Inpatient Hospital Stay (HOSPITAL_COMMUNITY): Payer: HRSA Program

## 2020-10-02 DIAGNOSIS — Z9281 Personal history of extracorporeal membrane oxygenation (ECMO): Secondary | ICD-10-CM | POA: Diagnosis not present

## 2020-10-02 DIAGNOSIS — U071 COVID-19: Secondary | ICD-10-CM | POA: Diagnosis not present

## 2020-10-02 DIAGNOSIS — J9602 Acute respiratory failure with hypercapnia: Secondary | ICD-10-CM | POA: Diagnosis not present

## 2020-10-02 DIAGNOSIS — Z9911 Dependence on respirator [ventilator] status: Secondary | ICD-10-CM | POA: Diagnosis not present

## 2020-10-02 DIAGNOSIS — J80 Acute respiratory distress syndrome: Secondary | ICD-10-CM | POA: Diagnosis not present

## 2020-10-02 DIAGNOSIS — J9601 Acute respiratory failure with hypoxia: Secondary | ICD-10-CM | POA: Diagnosis not present

## 2020-10-02 DIAGNOSIS — D649 Anemia, unspecified: Secondary | ICD-10-CM | POA: Diagnosis not present

## 2020-10-02 LAB — POCT I-STAT 7, (LYTES, BLD GAS, ICA,H+H)
Acid-Base Excess: 0 mmol/L (ref 0.0–2.0)
Acid-Base Excess: 1 mmol/L (ref 0.0–2.0)
Acid-Base Excess: 1 mmol/L (ref 0.0–2.0)
Acid-Base Excess: 0 mmol/L (ref 0.0–2.0)
Acid-Base Excess: 1 mmol/L (ref 0.0–2.0)
Bicarbonate: 26.1 mmol/L (ref 20.0–28.0)
Bicarbonate: 26.2 mmol/L (ref 20.0–28.0)
Bicarbonate: 27.4 mmol/L (ref 20.0–28.0)
Bicarbonate: 26.2 mmol/L (ref 20.0–28.0)
Bicarbonate: 27.3 mmol/L (ref 20.0–28.0)
Calcium, Ion: 1.3 mmol/L (ref 1.15–1.40)
Calcium, Ion: 1.3 mmol/L (ref 1.15–1.40)
Calcium, Ion: 1.31 mmol/L (ref 1.15–1.40)
Calcium, Ion: 1.3 mmol/L (ref 1.15–1.40)
Calcium, Ion: 1.31 mmol/L (ref 1.15–1.40)
HCT: 25 % — ABNORMAL LOW (ref 39.0–52.0)
HCT: 23 % — ABNORMAL LOW (ref 39.0–52.0)
HCT: 26 % — ABNORMAL LOW (ref 39.0–52.0)
HCT: 23 % — ABNORMAL LOW (ref 39.0–52.0)
HCT: 26 % — ABNORMAL LOW (ref 39.0–52.0)
Hemoglobin: 8.5 g/dL — ABNORMAL LOW (ref 13.0–17.0)
Hemoglobin: 7.8 g/dL — ABNORMAL LOW (ref 13.0–17.0)
Hemoglobin: 8.8 g/dL — ABNORMAL LOW (ref 13.0–17.0)
Hemoglobin: 7.8 g/dL — ABNORMAL LOW (ref 13.0–17.0)
Hemoglobin: 8.8 g/dL — ABNORMAL LOW (ref 13.0–17.0)
O2 Saturation: 94 %
O2 Saturation: 92 %
O2 Saturation: 93 %
O2 Saturation: 95 %
O2 Saturation: 93 %
Patient temperature: 36.8
Patient temperature: 98.8
Patient temperature: 36.9
Patient temperature: 36.7
Patient temperature: 37
Potassium: 4.3 mmol/L (ref 3.5–5.1)
Potassium: 4.5 mmol/L (ref 3.5–5.1)
Potassium: 4.2 mmol/L (ref 3.5–5.1)
Potassium: 3.8 mmol/L (ref 3.5–5.1)
Potassium: 4.9 mmol/L (ref 3.5–5.1)
Sodium: 140 mmol/L (ref 135–145)
Sodium: 139 mmol/L (ref 135–145)
Sodium: 140 mmol/L (ref 135–145)
Sodium: 138 mmol/L (ref 135–145)
Sodium: 139 mmol/L (ref 135–145)
TCO2: 28 mmol/L (ref 22–32)
TCO2: 28 mmol/L (ref 22–32)
TCO2: 29 mmol/L (ref 22–32)
TCO2: 28 mmol/L (ref 22–32)
TCO2: 29 mmol/L (ref 22–32)
pCO2 arterial: 50 mmHg — ABNORMAL HIGH (ref 32.0–48.0)
pCO2 arterial: 46.9 mmHg (ref 32.0–48.0)
pCO2 arterial: 52.5 mmHg — ABNORMAL HIGH (ref 32.0–48.0)
pCO2 arterial: 50.2 mmHg — ABNORMAL HIGH (ref 32.0–48.0)
pCO2 arterial: 50 mmHg — ABNORMAL HIGH (ref 32.0–48.0)
pH, Arterial: 7.324 — ABNORMAL LOW (ref 7.350–7.450)
pH, Arterial: 7.356 (ref 7.350–7.450)
pH, Arterial: 7.325 — ABNORMAL LOW (ref 7.350–7.450)
pH, Arterial: 7.325 — ABNORMAL LOW (ref 7.350–7.450)
pH, Arterial: 7.346 — ABNORMAL LOW (ref 7.350–7.450)
pO2, Arterial: 76 mmHg — ABNORMAL LOW (ref 83.0–108.0)
pO2, Arterial: 66 mmHg — ABNORMAL LOW (ref 83.0–108.0)
pO2, Arterial: 75 mmHg — ABNORMAL LOW (ref 83.0–108.0)
pO2, Arterial: 80 mmHg — ABNORMAL LOW (ref 83.0–108.0)
pO2, Arterial: 71 mmHg — ABNORMAL LOW (ref 83.0–108.0)

## 2020-10-02 LAB — TYPE AND SCREEN
ABO/RH(D): O POS
Antibody Screen: NEGATIVE
Unit division: 0
Unit division: 0
Unit division: 0
Unit division: 0

## 2020-10-02 LAB — BPAM RBC
Blood Product Expiration Date: 202202012359
Blood Product Expiration Date: 202202012359
Blood Product Expiration Date: 202202012359
Blood Product Expiration Date: 202202032359
ISSUE DATE / TIME: 202112271943
ISSUE DATE / TIME: 202201010719
ISSUE DATE / TIME: 202201010719
Unit Type and Rh: 5100
Unit Type and Rh: 5100
Unit Type and Rh: 5100
Unit Type and Rh: 5100

## 2020-10-02 LAB — RENAL FUNCTION PANEL
Albumin: 3 g/dL — ABNORMAL LOW (ref 3.5–5.0)
Albumin: 2.9 g/dL — ABNORMAL LOW (ref 3.5–5.0)
Anion gap: 10 (ref 5–15)
Anion gap: 11 (ref 5–15)
BUN: 70 mg/dL — ABNORMAL HIGH (ref 6–20)
BUN: 71 mg/dL — ABNORMAL HIGH (ref 6–20)
CO2: 24 mmol/L (ref 22–32)
CO2: 25 mmol/L (ref 22–32)
Calcium: 9 mg/dL (ref 8.9–10.3)
Calcium: 8.8 mg/dL — ABNORMAL LOW (ref 8.9–10.3)
Chloride: 104 mmol/L (ref 98–111)
Chloride: 100 mmol/L (ref 98–111)
Creatinine, Ser: 1.34 mg/dL — ABNORMAL HIGH (ref 0.61–1.24)
Creatinine, Ser: 1.34 mg/dL — ABNORMAL HIGH (ref 0.61–1.24)
GFR, Estimated: >60 mL/min (ref >60)
GFR, Estimated: >60 mL/min (ref >60)
Glucose, Bld: 99 mg/dL (ref 70–99)
Glucose, Bld: 113 mg/dL — ABNORMAL HIGH (ref 70–99)
Phosphorus: 6.4 mg/dL — ABNORMAL HIGH (ref 2.5–4.6)
Phosphorus: 6.2 mg/dL — ABNORMAL HIGH (ref 2.5–4.6)
Potassium: 4.2 mmol/L (ref 3.5–5.1)
Potassium: 3.8 mmol/L (ref 3.5–5.1)
Sodium: 138 mmol/L (ref 135–145)
Sodium: 136 mmol/L (ref 135–145)

## 2020-10-02 LAB — CBC
HCT: 24.3 % — ABNORMAL LOW (ref 39.0–52.0)
HCT: 26.9 % — ABNORMAL LOW (ref 39.0–52.0)
HCT: 25.8 % — ABNORMAL LOW (ref 39.0–52.0)
Hemoglobin: 7.7 g/dL — ABNORMAL LOW (ref 13.0–17.0)
Hemoglobin: 8.5 g/dL — ABNORMAL LOW (ref 13.0–17.0)
Hemoglobin: 8.4 g/dL — ABNORMAL LOW (ref 13.0–17.0)
MCH: 31.2 pg (ref 26.0–34.0)
MCH: 30.5 pg (ref 26.0–34.0)
MCH: 31 pg (ref 26.0–34.0)
MCHC: 31.7 g/dL (ref 30.0–36.0)
MCHC: 31.6 g/dL (ref 30.0–36.0)
MCHC: 32.6 g/dL (ref 30.0–36.0)
MCV: 98.4 fL (ref 80.0–100.0)
MCV: 96.4 fL (ref 80.0–100.0)
MCV: 95.2 fL (ref 80.0–100.0)
Platelets: 117 10*3/uL — ABNORMAL LOW (ref 150–400)
Platelets: 122 10*3/uL — ABNORMAL LOW (ref 150–400)
Platelets: 113 10*3/uL — ABNORMAL LOW (ref 150–400)
RBC: 2.47 MIL/uL — ABNORMAL LOW (ref 4.22–5.81)
RBC: 2.79 MIL/uL — ABNORMAL LOW (ref 4.22–5.81)
RBC: 2.71 MIL/uL — ABNORMAL LOW (ref 4.22–5.81)
RDW: 16.8 % — ABNORMAL HIGH (ref 11.5–15.5)
RDW: 16.7 % — ABNORMAL HIGH (ref 11.5–15.5)
RDW: 16.6 % — ABNORMAL HIGH (ref 11.5–15.5)
WBC: 11.2 10*3/uL — ABNORMAL HIGH (ref 4.0–10.5)
WBC: 9.7 10*3/uL (ref 4.0–10.5)
WBC: 8.8 10*3/uL (ref 4.0–10.5)
nRBC: 0 % (ref 0.0–0.2)
nRBC: 0 % (ref 0.0–0.2)
nRBC: 0 % (ref 0.0–0.2)

## 2020-10-02 LAB — APTT
aPTT: 55 seconds — ABNORMAL HIGH (ref 24–36)
aPTT: 55 seconds — ABNORMAL HIGH (ref 24–36)

## 2020-10-02 LAB — HEPATIC FUNCTION PANEL
ALT: 46 U/L — ABNORMAL HIGH (ref 0–44)
AST: 48 U/L — ABNORMAL HIGH (ref 15–41)
Albumin: 3 g/dL — ABNORMAL LOW (ref 3.5–5.0)
Alkaline Phosphatase: 141 U/L — ABNORMAL HIGH (ref 38–126)
Bilirubin, Direct: 0.3 mg/dL — ABNORMAL HIGH (ref 0.0–0.2)
Indirect Bilirubin: 0.8 mg/dL (ref 0.3–0.9)
Total Bilirubin: 1.1 mg/dL (ref 0.3–1.2)
Total Protein: 5.9 g/dL — ABNORMAL LOW (ref 6.5–8.1)

## 2020-10-02 LAB — LACTATE DEHYDROGENASE: LDH: 539 U/L — ABNORMAL HIGH (ref 98–192)

## 2020-10-02 LAB — FIBRINOGEN: Fibrinogen: 460 mg/dL (ref 210–475)

## 2020-10-02 LAB — GLUCOSE, CAPILLARY
Glucose-Capillary: 103 mg/dL — ABNORMAL HIGH (ref 70–99)
Glucose-Capillary: 97 mg/dL (ref 70–99)
Glucose-Capillary: 112 mg/dL — ABNORMAL HIGH (ref 70–99)
Glucose-Capillary: 112 mg/dL — ABNORMAL HIGH (ref 70–99)
Glucose-Capillary: 99 mg/dL (ref 70–99)

## 2020-10-02 LAB — PREPARE RBC (CROSSMATCH)

## 2020-10-02 LAB — MAGNESIUM: Magnesium: 2.8 mg/dL — ABNORMAL HIGH (ref 1.7–2.4)

## 2020-10-02 LAB — TRIGLYCERIDES: Triglycerides: 114 mg/dL (ref <150)

## 2020-10-02 LAB — LACTIC ACID, PLASMA
Lactic Acid, Venous: 0.6 mmol/L (ref 0.5–1.9)
Lactic Acid, Venous: 0.4 mmol/L — ABNORMAL LOW (ref 0.5–1.9)

## 2020-10-02 MED ORDER — SODIUM CHLORIDE 0.9% IV SOLUTION: Freq: Once | INTRAVENOUS | Status: AC

## 2020-10-02 MED ORDER — CLONIDINE HCL 0.1 MG PO TABS
0.1000 mg | ORAL_TABLET | Freq: Three times a day (TID) | ORAL | Status: DC
  Administered 2020-10-02 – 2020-10-07 (×15): 0.1 mg
  Filled 2020-10-02 (×15): qty 1

## 2020-10-02 MED ORDER — GUAIFENESIN-DM 100-10 MG/5ML PO SYRP
5.0000 mL | ORAL_SOLUTION | ORAL | Status: DC | PRN
  Administered 2020-10-02 – 2020-10-11 (×11): 5 mL
  Filled 2020-10-02 (×11): qty 5

## 2020-10-02 MED ORDER — MIDAZOLAM HCL 2 MG/2ML IJ SOLN
2.0000 mg | INTRAMUSCULAR | Status: DC | PRN
  Administered 2020-10-02 – 2020-10-05 (×9): 2 mg via INTRAVENOUS
  Filled 2020-10-02 (×10): qty 2

## 2020-10-02 MED ORDER — POTASSIUM CHLORIDE 20 MEQ PO PACK
40.0000 meq | PACK | ORAL | Status: AC
Start: 2020-10-02 — End: 2020-10-02
  Administered 2020-10-02 (×2): 40 meq
  Filled 2020-10-02 (×2): qty 2

## 2020-10-02 MED ORDER — DEXMEDETOMIDINE HCL IN NACL 400 MCG/100ML IV SOLN
0.4000 ug/kg/h | INTRAVENOUS | Status: DC
  Administered 2020-10-02: 23:00:00 1.4 ug/kg/h via INTRAVENOUS
  Administered 2020-10-02: 0.8 ug/kg/h via INTRAVENOUS
  Administered 2020-10-03: 1.2 ug/kg/h via INTRAVENOUS
  Administered 2020-10-03 – 2020-10-04 (×3): 0.8 ug/kg/h via INTRAVENOUS
  Administered 2020-10-04: 11:00:00 0.4 ug/kg/h via INTRAVENOUS
  Administered 2020-10-04: 23:00:00 1.2 ug/kg/h via INTRAVENOUS
  Administered 2020-10-05: 13:00:00 0.4 ug/kg/h via INTRAVENOUS
  Administered 2020-10-07: 16:00:00 1.2 ug/kg/h via INTRAVENOUS
  Administered 2020-10-07: 0.2 ug/kg/h via INTRAVENOUS
  Administered 2020-10-07 – 2020-10-08 (×2): 1 ug/kg/h via INTRAVENOUS
  Administered 2020-10-08: 08:00:00 0.7 ug/kg/h via INTRAVENOUS
  Administered 2020-10-08 (×2): 1.2 ug/kg/h via INTRAVENOUS
  Administered 2020-10-08: 1 ug/kg/h via INTRAVENOUS
  Administered 2020-10-08: 17:00:00 1.2 ug/kg/h via INTRAVENOUS
  Administered 2020-10-09: 14:00:00 0.6 ug/kg/h via INTRAVENOUS
  Administered 2020-10-09: 02:00:00 0.7 ug/kg/h via INTRAVENOUS
  Administered 2020-10-09: 0.9 ug/kg/h via INTRAVENOUS
  Administered 2020-10-09 – 2020-10-10 (×2): 0.6 ug/kg/h via INTRAVENOUS
  Administered 2020-10-10: 15:00:00 1.2 ug/kg/h via INTRAVENOUS
  Administered 2020-10-10: 0.5 ug/kg/h via INTRAVENOUS
  Administered 2020-10-10: 0.7 ug/kg/h via INTRAVENOUS
  Administered 2020-10-11: 0.8 ug/kg/h via INTRAVENOUS
  Administered 2020-10-11 – 2020-10-12 (×3): 0.4 ug/kg/h via INTRAVENOUS
  Administered 2020-10-12: 0.8 ug/kg/h via INTRAVENOUS
  Filled 2020-10-02 (×30): qty 100

## 2020-10-02 NOTE — Progress Notes (Signed)
This chaplain provided F/U spiritual care for the Pt. and his wife-[name] by phone.  [name] welcomed the phone call and accepted F/U from PMT NP-Mary Larach on Tuesday.  [name] continues to [employer] on her faith and family for support. [name] describes herself as good and explains the Pt.'s  "little improvement" she saw on Saturday provides hope "a little bit at a time."    The chaplain listened to [facility] describe the Pt. as  resource of Biblical answers. [name] reflected on the Pt. and his struggle to understand some deaths in scripture.  [name] affirmed faith as comfort to the questions.     This chaplain will respond to [facility] request for bedside F/U spiritual care with the Pt.

## 2020-10-02 NOTE — Progress Notes (Signed)
ANTICOAGULATION CONSULT NOTE  Pharmacy Consult for bivalirudin Indication: ECMO + bilateral DVTs + [facility] 12/26   Recent Labs    10/01/20 0508 10/01/20 0518 10/01/20 1556 10/01/20 1945 10/01/20 2056 10/02/20 0401 10/02/20 0411  HGB 8.0*   < > 8.5*  8.1*   < > 7.8* 7.7* 8.5*  HCT 24.7*   < > 25.0*  25.0*   < > 23.0* 24.3* 25.0*  PLT 121*  --  135*  --   --  117*  --   APTT 57*  --  55*  --   --  55*  --   CREATININE 1.23  --  1.24  --   --  1.34*  --    < > = values in this interval not displayed.    Estimated Creatinine Clearance: 69.4 mL/min (A) (by C-G formula based on SCr of 1.34 mg/dL (H)).   Assessment: 60 yom unvaccinated presenting with severe COVID ARDs, intubated on 12/6, continued to have ventilation issues despite NMB and proning - started on VV ECMO on 12/8. Pt noted to have bilateral DVTs 12/8. Bivalirudin started for ECMO anticoagulation. Head CT 12/26 with new small SAH, repeat 12/27 stable without expansion.  1/3 AM update:  APTT remains within goal range at 55  Goal of Therapy:  aPTT 50-70 seconds  Monitor platelets by anticoagulation protocol: Yes   Plan:  Continue bivalirudin at 0.04 mg/kg/hr Recheck aPTTs every 5a & 5p with ECMO labs.  Abran Duke, PharmD, BCPS Clinical Pharmacist Phone: [phone]

## 2020-10-02 NOTE — Procedures (Signed)
Extracorporeal support note     ECLS cannulation: 09/09/2020 Last circuit change: 09/11/2020  Indication: COVD ARDS   Configuration: VV, RIJ 32Fr [place]   Pump speed: 3275 Pump flow: 4.2 L Pump used: Cardiohelp   Sweep gas: 100%, 5 LPM   Circuit check: clot only in corners of oxygenator Anticoagulant: bivalirudin   Changes in support: Con't aggressive care. Monitor off antibiotics. PTT goal 50-70  anticipated goals/duration of support: bridge to recovery, day 25 of support.  Steffanie Dunn, DO 10/02/20 8:26 AM [facility]

## 2020-10-02 NOTE — Progress Notes (Addendum)
Pt agitated and restless. bp elevated with sbp in 200s, respiration in 40s. Pt given haldol to help with agitation. Noise reduced and environment made quiet as possible. Updated wife on pt agitation, questions answered regarding medication and prbc given this morning.

## 2020-10-02 NOTE — Progress Notes (Signed)
NAME:  Donald Rowe, MRN:  [id number], DOB:  Jan 09, 1961, LOS: 60 ADMISSION DATE:  09/01/2020, CONSULTATION DATE:  12/6 REFERRING MD:  Dr. Aileen Fass, CHIEF COMPLAINT:  SOB    Brief History   60 y/o M admitted 12/4 with 1 week hx of SOB, known COVID positive.  He is unvaccinated.  CTA chest negative for PE but demonstrated diffuse bilateral infiltrates. [place] started on 12/8   Past Medical History  Hypothyroidism  [place] [facility] Events/Procedures  12/04 Admit  12/06 PCCM consulted  12/07 Intubated, central line, a line 12/08 VV ECMO Cannulaton, cortrak 12/13 circuit change for hemolytic anemia 12/16 Tracheostomy  12/17 bronch w/ BAL  Consults:  PCCM, Heart failure, TCTS, ECMO  Significant Diagnostic Tests:   CTA Chest 12/4 >> extensive bilateral airspace disease, no large PE identified, limited study   LE Venous Duplex 12/4 >> negative for DVT bilaterally   LE venous duplex 12/8> BLE DVTs  RUE venous duplex 12/23  RUE arterial duplex 12/23  CT head 12/26 : Minimal SAH on left side  CT abdomen and pelvis 12/26: Extensive bilateral airspace disease with small pleural effusion  CT head 12/27: Subarachnoid unchanged  Chest x-ray 1/1: Significant improvement in airspace disease  Micro Data:  COVID 12/4 >> negative  Influenza A/B 12/4 >> negative  MRSA PCR 12/5 >> negative  BCx2 12/4 >> NG Trach aspirate 12/12> rare MRSA BAL 12/17> MRSA, candida Aspergillus Ag BAL 12/17> 0.05 12/18 sars2: positive  Antimicrobials:  Azithromycin 12/5 >> 12/6  Ceftriaxone 12/5 >> 12/6  Cefepime 12/7>12/14 vanc 12/14>12/20 Meropenem 12/17>12/21  Interim history/subjective:  Followed commands this morning before meds given. Back on fentanyl infusion overnight.  Objective   Blood pressure 122/60, pulse 87, temperature 98.2 F (36.8 C), temperature source Core, resp. rate 14, height _0  (1.753 m), weight 100.6 kg, SpO2 99 %.    Vent Mode: PCV FiO2 (%):  [40  %-60 %] 40 % Set Rate:  [12 bmp] 12 bmp PEEP:  [10 cmH20] 10 cmH20 Plateau Pressure:  [18 cmH20] 18 cmH20   Intake/Output Summary (Last 24 hours) at 10/02/2020 0656 Last data filed at 10/02/2020 0600 Gross per 24 hour  Intake 3120.76 ml  Output 2625 ml  Net 495.76 ml   Filed Weights   09/30/20 0500 10/01/20 0500 10/02/20 0500  Weight: 99.5 kg 101.2 kg 100.6 kg    Examination: Gen: critically ill appearing man laying in bed in NAD HEENT: [place]/AT, eyes with icteric scleral edema Neck: supple, ECMO cannula on the R CV: tachycardic, regular rhythm Pulm:rhale bilaterally, tachypneic breathing above the vent. Abd: soft, NT, ND Extm: dependent edema in thighs, no clubbing or cyanosis.  Skin: warm, dry, pallor  Neuro: RASS -5, no following commands or withdrawing from pain. Briskly reactive pupils.  Vent PA 10+ CPAP 10, Vt ~450cc  Resolved Hospital Problem list     Assessment & Plan:   Acute hypoxemic and hypercarbic respiratory failure secondary to COVID PNA with severe ARDS. S/p VV ECMO cannulation 09/16/2020.  S/p tracheostomy 08/14 complicated by trach site bleeding improved Trivial left-sided subarachnoid hemorrhage Community-acquired pneumonia with MRSA -Completed remdesivir, solumedrol, and baricitnib. -con't full ECMO support, weaning sweep as tolerated -Completed for treatment with 4 sessions of Plex for hemolysis -Furosemide BID for goal euvolemia -completed 7 days vanc for MRSA pneumonia; monitor off antibiotics.  Agitated delirium requiring titration of IV sedation.  Periods of severe agitation are limiting ability to wean ECMO -turn off fentanyl gtt -Continue enteral seroquel,  oxycodone and clonazepam  Bilateral lower ext DVTs- provoked by COVID infection -Continue bivalirudin -needs 3 months AC for provoked DVT   Steroid-induced hyperglycemia- resolved -SSI PRN -may need increased insulin as TF uptitrated  Hypothyroidism -con't PTA synthroid   Labile  hypertension, contributed by periods of agitation.  Hypotension after meds. -space out sedating meds -decrease clonidine dose -con't hydralazine and metoprolol  Hemolytic anemia - improved. More expected down trending of Hb with expected losses associated with ECMO Throbocytopenia Hyperbilirubinemia Rheumatologic-associated MAHA remains in differential.  hgb and plt are stable -Completed treatment with the Plex for 4 sessions -Transfuse if hgb <8; giving 1 unit pRBCs today  Vomiting -con't reglan -con't uptitrating TF  Best practice (evaluated daily)  Diet: TF- uptitrating since vomiting Pain/Anxiety/Delirium protocol (if indicated): PAD protocol, limit IV meds VAP protocol (if indicated): yes DVT prophylaxis: Bivalirudin GI prophylaxis: pantoprazole  BID Glucose control: SSI Mobility: As tolerated Last date of multidisciplinary goals of care discussion: wife updated over the phone 12/23 Summary of discussion con't aggressive care Follow up goals of care discussion due: 12/30 Code Status: full Disposition: ICU  This patient is critically ill with multiple organ system failure which requires frequent high complexity decision making, assessment, support, evaluation, and titration of therapies. This was completed through the application of advanced monitoring technologies and extensive interpretation of multiple databases. During this encounter critical care time was devoted to patient care services described in this note for 49 minutes.   [name], DO 10/02/20 8:38 AM [facility]

## 2020-10-02 NOTE — Progress Notes (Signed)
OT Cancellation Note  Patient Details Name: Donald Rowe MRN: [id number] DOB: 30-Apr-1961   Cancelled Treatment:    Reason Eval/Treat Not Completed: Medical issues which prohibited therapy (Aggitated and elevated BP. RN request hold. Will return as schedule allows. Thank you.)  Fantasy Donald M Odile Veloso Kairen Hallinan MSOT, OTR/L Acute Rehab Pager: [phone] Office: [phone] 10/02/2020, 2:19 PM

## 2020-10-02 NOTE — Progress Notes (Signed)
ANTICOAGULATION CONSULT NOTE  Pharmacy Consult for bivalirudin Indication: ECMO + bilateral DVTs + [facility] 12/26   Recent Labs    10/01/20 1556 10/01/20 1945 10/02/20 0401 10/02/20 0411 10/02/20 1142 10/02/20 1529 10/02/20 1600 10/02/20 1613  HGB 8.5*  8.1*   < > 7.7*   < > 8.8* 8.5*  --  8.4*  HCT 25.0*  25.0*   < > 24.3*   < > 26.0* 26.9*  --  25.8*  PLT 135*  --  117*  --   --  122*  --  113*  APTT 55*  --  55*  --   --   --   --  55*  CREATININE 1.24  --  1.34*  --   --   --  1.34*  --    < > = values in this interval not displayed.    Estimated Creatinine Clearance: 69.4 mL/min (A) (by C-G formula based on SCr of 1.34 mg/dL (H)).   Assessment: 62 yom unvaccinated presenting with severe COVID ARDs, intubated on 12/6, continued to have ventilation issues despite NMB and proning - started on VV ECMO on 12/8. Pt noted to have bilateral DVTs 12/8. Bivalirudin started for ECMO anticoagulation. Head CT 12/26 with new small SAH, repeat 12/27 stable without expansion.  APTT this afternoon remains within goal range at 55 seconds.  Goal of Therapy:  aPTT 50-70 seconds  Monitor platelets by anticoagulation protocol: Yes   Plan:  Continue bivalirudin at 0.04 mg/kg/hr Recheck aPTTs every 5a & 5p with ECMO labs.  Fredonia Highland, PharmD, BCPS, [facility] Clinical Pharmacist [phone] Please check AMION for all [facility] numbers 10/02/2020

## 2020-10-02 NOTE — Plan of Care (Signed)
Wife [name] updated at bedside.  Steffanie Dunn, DO 10/02/20 4:58 PM [facility]

## 2020-10-02 NOTE — Progress Notes (Signed)
Patient ID: Donald Rowe, male   DOB: 06/10/1961, 60 y.o.   MRN: [id number]    Advanced Heart Failure Rounding Note   Subjective:    12/8 Cannulated for VV ECMO - 32 FR  RIJ [place] 12/9 Extubated/Reintubated for altered mental status 12/13 Circuit change for elevated LDH 12/15 PLEX started 12/16 Tracheostomy 12/23 head CT (normal) for change in mental status.  12/26 Pan CT for drop in hgb. Small SAH. Severe lung disease 12/27 CVVH begun 1/1 CVVH stopped  Following simple commands this am. BP still very labile and drops significantly with sedation. Off CVVHD. Weight stable . Making good urine.   CXR unchanged - severe bilateral infiltrates Personally reviewed   ECMO   Speed 3200 Flow 4.0 L Sweep 7 -> 5.5 dP 22 pVen -66  ABG: 7.32/50/76/94% Hgb 7.7 LDH 518 -> 488 -> 514 -> 498 -> 539 Lactic acid 0.6 PTT 55  Vent 40%  TV ~ 400c  Objective:   Weight Range:  Vital Signs:   Temp:  [98.1 F (36.7 C)-98.8 F (37.1 C)] 98.6 F (37 C) (01/03 1040) Pulse Rate:  [80-133] 99 (01/03 1145) Resp:  [12-41] 19 (01/03 1145) BP: (103-182)/(47-118) 182/118 (01/03 1130) SpO2:  [93 %-100 %] 95 % (01/03 1145) Arterial Line BP: (122-209)/(42-86) 155/64 (01/03 1145) FiO2 (%):  [40 %-60 %] 40 % (01/03 1119) Weight:  [100.6 kg] 100.6 kg (01/03 0500) Last BM Date: 10/01/20  Weight change: Filed Weights   09/30/20 0500 10/01/20 0500 10/02/20 0500  Weight: 99.5 kg 101.2 kg 100.6 kg    Intake/Output:   Intake/Output Summary (Last 24 hours) at 10/02/2020 1325 Last data filed at 10/02/2020 1040 Gross per 24 hour  Intake 2701.17 ml  Output 2750 ml  Net -48.83 ml     Physical Exam: General:  Awake will follow basic commands HEENT: normal Neck: supple. RIJ ECMO  + tracheostomy Carotids 2+ bilat; no bruits. No lymphadenopathy or thryomegaly appreciated. Cor: PMI nondisplaced. Regular rate & rhythm. No rubs, gallops or murmurs. Lungs: coarse Abdomen: obese soft, nontender,  nondistended. No hepatosplenomegaly. No bruits or masses. Good bowel sounds. Extremities: no cyanosis, clubbing, rash, edema Neuro: awake following basic commands  Telemetry: sinus 95-110 Personally reviewed     Labs: Basic Metabolic Panel: Recent Labs  Lab 09/28/20 0301 09/28/20 0302 09/29/20 0315 09/29/20 0316 09/30/20 0404 09/30/20 0415 09/30/20 1550 09/30/20 1556 10/01/20 0508 10/01/20 0518 10/01/20 1556 10/01/20 1945 10/01/20 2056 10/02/20 0401 10/02/20 0411 10/02/20 0830  NA  --    < >  --    < > 140   < > 140   < > 140   < > 139  140 139 139 138 140 139  K  --    < >  --    < > 4.9   < > 5.1   < > 4.6   < > 4.5  4.5 4.6 4.3 4.2 4.3 4.5  CL  --    < >  --    < > 106  --  106  --  106  --  106  --   --  104  --   --   CO2  --    < >  --    < > 24  --  24  --  23  --  25  --   --  24  --   --   GLUCOSE  --    < >  --    < >  73  --  58*  --  69*  --  96  --   --  99  --   --   BUN  --    < >  --    < > 49*  --  55*  --  64*  --  66*  --   --  70*  --   --   CREATININE  --    < >  --    < > 0.92  --  1.20  --  1.23  --  1.24  --   --  1.34*  --   --   CALCIUM  --    < >  --    < > 8.2*  --  8.7*  --  9.0  --  8.9  --   --  9.0  --   --   MG 3.0*  --  3.1*  --  3.2*  --   --   --  3.0*  --   --   --   --  2.8*  --   --   PHOS  --    < >  --    < > 3.2  --  3.8  --  4.4  --  6.5*  --   --  6.4*  --   --    < > = values in this interval not displayed.    Liver Function Tests: Recent Labs  Lab 09/26/20 0403 09/26/20 1548 09/27/20 0339 09/27/20 1701 09/30/20 0404 09/30/20 1550 10/01/20 0508 10/01/20 1556 10/02/20 0401  AST 49*  --  49*  --   --   --   --   --  48*  ALT 39  --  50*  --   --   --   --   --  46*  ALKPHOS 112  --  138*  --   --   --   --   --  141*  BILITOT 1.7*  --  1.4*  --   --   --   --   --  1.1  PROT 5.1*  --  5.4*  --   --   --   --   --  5.9*  ALBUMIN 2.7*  2.6*   < > 2.8*  2.8*   < > 2.8* 3.0* 3.2* 3.2* 3.0*  3.0*   < > = values in  this interval not displayed.   No results for input(s): LIPASE, AMYLASE in the last 168 hours. No results for input(s): AMMONIA in the last 168 hours.  CBC: Recent Labs  Lab 09/28/20 2000 09/28/20 2014 09/30/20 0404 09/30/20 0415 09/30/20 1550 09/30/20 1556 10/01/20 0508 10/01/20 0518 10/01/20 1556 10/01/20 1945 10/01/20 2056 10/02/20 0401 10/02/20 0411 10/02/20 0830  WBC 9.8   < > 10.4  --  14.7*  --  11.9*  --  12.3*  --   --  11.2*  --   --   NEUTROABS 6.0  --   --   --   --   --   --   --   --   --   --   --   --   --   HGB 9.1*   < > 8.5*   < > 8.8*   < > 8.0*   < > 8.5*  8.1* 7.8* 7.8* 7.7* 8.5* 7.8*  HCT 28.0*   < > 25.1*   < > 27.5*   < >  24.7*   < > 25.0*  25.0* 23.0* 23.0* 24.3* 25.0* 23.0*  MCV 94.6   < > 95.4  --  96.5  --  98.0  --  96.9  --   --  98.4  --   --   PLT 104*   < > 119*  --  151  --  121*  --  135*  --   --  117*  --   --    < > = values in this interval not displayed.    Cardiac Enzymes: No results for input(s): CKTOTAL, CKMB, CKMBINDEX, TROPONINI in the last 168 hours.  BNP: BNP (last 3 results) No results for input(s): BNP in the last 8760 hours.  ProBNP (last 3 results) No results for input(s): PROBNP in the last 8760 hours.    Other results:  Imaging: DG CHEST PORT 1 VIEW  Result Date: 10/02/2020 CLINICAL DATA:  Respiratory failure. EXAM: PORTABLE CHEST 1 VIEW COMPARISON:  10/01/2020 FINDINGS: 0530 hours. Tracheostomy tube again noted. The NG tube passes into the stomach although the distal tip position is not included on the film. ECMO cannula remains in place. Left PICC line tip overlies the proximal to mid SVC level. Diffuse bilateral airspace disease is similar. IMPRESSION: 1. No substantial interval change in cardiopulmonary exam. 2. Stable support apparatus. Electronically Signed   By: Misty Stanley M.D.   On: 10/02/2020 06:17   DG CHEST PORT 1 VIEW  Result Date: 10/01/2020 CLINICAL DATA:  60 year old male COVID-5.  On ECMO.  EXAM: PORTABLE CHEST 1 VIEW COMPARISON:  Portable chest 09/30/2020 and earlier. FINDINGS: Portable AP supine view at 0527 hours. Stable tracheostomy, ECMO cannula. Stable left PICC line. Enteric tube has been placed and courses to the stomach with tip not included. Interval resolved gas distended stomach in the visible abdomen. Diffuse bilateral pulmonary opacity, more confluent in both lungs since yesterday with increased obscuration of the mediastinum. No pneumothorax identified. No pleural effusion is evident. Paucity of bowel gas now in the upper abdomen. Stable visualized osseous structures. IMPRESSION: 1. Worsening, now severe bilateral pulmonary opacity. Obscured mediastinal contours. No pneumothorax. 2. Enteric tube placed and stomach decompressed. 3. Otherwise stable lines and tubes. Electronically Signed   By: Genevie Ann M.D.   On: 10/01/2020 07:37     Medications:     Scheduled Medications: . amLODipine  10 mg Per Tube QHS  . B-complex with vitamin C  1 tablet Per Tube Daily  . chlorhexidine gluconate (MEDLINE KIT)  15 mL Mouth Rinse BID  . Chlorhexidine Gluconate Cloth  6 each Topical Daily  . clonazePAM  2 mg Per Tube Q8H  . cloNIDine  0.1 mg Per Tube TID  . docusate  100 mg Per Tube BID  . feeding supplement (PROSource TF)  45 mL Per Tube QID  . fiber  1 packet Per Tube BID  . furosemide  40 mg Intravenous BID  . insulin aspart  2-6 Units Subcutaneous Q4H  . levothyroxine  50 mcg Per Tube Q0600  . mouth rinse  15 mL Mouth Rinse 10 times per day  . metoCLOPramide (REGLAN) injection  10 mg Intravenous Q6H  . metoprolol tartrate  50 mg Per Tube Q12H  . nystatin  5 mL Per Tube QID  . oxyCODONE  10 mg Per Tube Q6H  . pantoprazole sodium  40 mg Per Tube BID  . polyethylene glycol  17 g Per Tube BID  . pravastatin  40 mg Per Tube q1800  .  QUEtiapine  150 mg Per Tube TID  . sodium chloride flush  10-40 mL Intracatheter Q12H    Infusions: . sodium chloride 250 mL (09/30/20 1447)   . albumin human 12.5 g (09/29/20 1644)  . bivalirudin (ANGIOMAX) infusion 0.5 mg/mL (Non-ACS indications) 0.04 mg/kg/hr (10/02/20 1000)  . feeding supplement (PIVOT 1.5 CAL) 1,000 mL (09/30/20 1832)  . fentaNYL infusion INTRAVENOUS 100 mcg/hr (10/02/20 1000)  . niCARDipine Stopped (09/30/20 0833)    PRN Medications: acetaminophen (TYLENOL) oral liquid 160 mg/5 mL, albumin human, albuterol, diphenhydrAMINE, fentaNYL, Gerhardt's butt cream, guaiFENesin-dextromethorphan, haloperidol lactate, labetalol, lip balm, LORazepam, ondansetron **OR** ondansetron (ZOFRAN) IV, phenylephrine, sodium chloride flush   Assessment/Plan:   1. Acute hypoxic respiratory failure/ARDS in setting of COVID-19 PNA - Cannulated for VV ECMO on 12/8 after failing intubation x 2 days - has complete remedisivir, baricitinib, Solumedrol.  - s/p trach 12/16 - CT chest 12/26 with severe lung disease and pneumomediastinum.  - Restarted vanc/cefepime 12/26 => now completed course.  - Circuit changed 12/13 for elevated LDH and lower flows. PLEX started 12/15. S/p PLEX #4/4 on 12/21. LDH has been stable but pending today. Circuit parameters stable.   - CXR still without much improvement. However sweep slightly lower at 5.5 today, continue slow wean.   - CVVH started 12/27 with intractable volume overload, stopped 1/1.  Now respoding well to IV lasix. Weight down. Creatinine stable  - Bivalirudin with PTT goal back up to 50-70. PTT 57 today - Mental status seems to be improving slowly. Can follow basic commands. Sedation adjusted.   2. Bilateral DVT - remains on bivalirudin - Discussed dosing with PharmD personally.  3. DM2 - Insulin adjusted. CBGs improved   4. Obesity -Body mass index is 33.73 kg/m.  5. F/E/N - Tolerating TFs  6. HTN - Remains very labile  - Meds adjusted to avoid hypotension  7. Rectal bleeding - follow closely. Rectal tube removed  8. AKI - Now off CVVH, Creatinine 1.3 today  9.  Acute encephalopathy - mild improvement.  - head CT ok 09/07/20 and 12/21 - head CT 12/26. Small SAH.  Repeat CT on 12/27 with unchanged small SAH.   10. Small SAH - on CT 12/26 - repeat CT 12/27 unchanged small SAH.  - PTT goal reduced 40-50 for several days, plan to go back to 50-70 today.  - avoid severe HTN as best as possible  11. Gastric ileus - improved with Reglan. Tolarating TFs. - Trickle feeds restarted.    12. Thrombocytopenia - Stable, mild.   13. Anemia - Transfuse hgb < 8.  - Has been getting periodic blood without overt GI bleeding. No change  Plan discussed on multi-discplinary ECMO rounds with CCM, ECMO coordinator/specialist, PharmDs and RNs.  CRITICAL CARE Performed by: Glori Bickers  Total critical care time: 35 minutes  Critical care time was exclusive of separately billable procedures and treating other patients.  Critical care was necessary to treat or prevent imminent or life-threatening deterioration.  Critical care was time spent personally by me (independent of midlevel providers or residents) on the following activities: development of treatment plan with patient and/or surrogate as well as nursing, discussions with consultants, evaluation of patient's response to treatment, examination of patient, obtaining history from patient or surrogate, ordering and performing treatments and interventions, ordering and review of laboratory studies, ordering and review of radiographic studies, pulse oximetry and re-evaluation of patient's condition.    Length of Stay: 30   Tavone Caesar MD 10/02/2020, 1:25 PM  Advanced Heart Failure Team Pager [phone] (M-F; [place])  Please contact [facility] for night-coverage after hours (4p -7a ) and weekends on [email or web address]

## 2020-10-03 ENCOUNTER — Inpatient Hospital Stay (HOSPITAL_COMMUNITY): Payer: HRSA Program

## 2020-10-03 DIAGNOSIS — Z515 Encounter for palliative care: Secondary | ICD-10-CM | POA: Diagnosis not present

## 2020-10-03 DIAGNOSIS — Z9281 Personal history of extracorporeal membrane oxygenation (ECMO): Secondary | ICD-10-CM | POA: Diagnosis not present

## 2020-10-03 DIAGNOSIS — J80 Acute respiratory distress syndrome: Secondary | ICD-10-CM | POA: Diagnosis not present

## 2020-10-03 DIAGNOSIS — G9340 Encephalopathy, unspecified: Secondary | ICD-10-CM

## 2020-10-03 DIAGNOSIS — J9601 Acute respiratory failure with hypoxia: Secondary | ICD-10-CM | POA: Diagnosis not present

## 2020-10-03 DIAGNOSIS — U071 COVID-19: Secondary | ICD-10-CM | POA: Diagnosis not present

## 2020-10-03 LAB — TYPE AND SCREEN
ABO/RH(D): O POS
Antibody Screen: NEGATIVE
Unit division: 0
Unit division: 0
Unit division: 0
Unit division: 0
Unit division: 0

## 2020-10-03 LAB — RENAL FUNCTION PANEL
Albumin: 2.7 g/dL — ABNORMAL LOW (ref 3.5–5.0)
Albumin: 2.6 g/dL — ABNORMAL LOW (ref 3.5–5.0)
Anion gap: 8 (ref 5–15)
Anion gap: 6 (ref 5–15)
BUN: 70 mg/dL — ABNORMAL HIGH (ref 6–20)
BUN: 62 mg/dL — ABNORMAL HIGH (ref 6–20)
CO2: 26 mmol/L (ref 22–32)
CO2: 25 mmol/L (ref 22–32)
Calcium: 8.9 mg/dL (ref 8.9–10.3)
Calcium: 8.8 mg/dL — ABNORMAL LOW (ref 8.9–10.3)
Chloride: 106 mmol/L (ref 98–111)
Chloride: 107 mmol/L (ref 98–111)
Creatinine, Ser: 1.28 mg/dL — ABNORMAL HIGH (ref 0.61–1.24)
Creatinine, Ser: 1.21 mg/dL (ref 0.61–1.24)
GFR, Estimated: >60 mL/min (ref >60)
GFR, Estimated: >60 mL/min (ref >60)
Glucose, Bld: 111 mg/dL — ABNORMAL HIGH (ref 70–99)
Glucose, Bld: 110 mg/dL — ABNORMAL HIGH (ref 70–99)
Phosphorus: 5.5 mg/dL — ABNORMAL HIGH (ref 2.5–4.6)
Phosphorus: 4.6 mg/dL (ref 2.5–4.6)
Potassium: 4.6 mmol/L (ref 3.5–5.1)
Potassium: 4.1 mmol/L (ref 3.5–5.1)
Sodium: 140 mmol/L (ref 135–145)
Sodium: 138 mmol/L (ref 135–145)

## 2020-10-03 LAB — POCT I-STAT 7, (LYTES, BLD GAS, ICA,H+H)
Acid-Base Excess: 1 mmol/L (ref 0.0–2.0)
Acid-Base Excess: 4 mmol/L — ABNORMAL HIGH (ref 0.0–2.0)
Acid-Base Excess: 3 mmol/L — ABNORMAL HIGH (ref 0.0–2.0)
Acid-Base Excess: 5 mmol/L — ABNORMAL HIGH (ref 0.0–2.0)
Acid-Base Excess: 2 mmol/L (ref 0.0–2.0)
Bicarbonate: 27.1 mmol/L (ref 20.0–28.0)
Bicarbonate: 29.4 mmol/L — ABNORMAL HIGH (ref 20.0–28.0)
Bicarbonate: 29.6 mmol/L — ABNORMAL HIGH (ref 20.0–28.0)
Bicarbonate: 30.5 mmol/L — ABNORMAL HIGH (ref 20.0–28.0)
Bicarbonate: 28.5 mmol/L — ABNORMAL HIGH (ref 20.0–28.0)
Calcium, Ion: 1.32 mmol/L (ref 1.15–1.40)
Calcium, Ion: 1.3 mmol/L (ref 1.15–1.40)
Calcium, Ion: 1.27 mmol/L (ref 1.15–1.40)
Calcium, Ion: 1.29 mmol/L (ref 1.15–1.40)
Calcium, Ion: 1.34 mmol/L (ref 1.15–1.40)
HCT: 23 % — ABNORMAL LOW (ref 39.0–52.0)
HCT: 27 % — ABNORMAL LOW (ref 39.0–52.0)
HCT: 27 % — ABNORMAL LOW (ref 39.0–52.0)
HCT: 24 % — ABNORMAL LOW (ref 39.0–52.0)
HCT: 24 % — ABNORMAL LOW (ref 39.0–52.0)
Hemoglobin: 7.8 g/dL — ABNORMAL LOW (ref 13.0–17.0)
Hemoglobin: 9.2 g/dL — ABNORMAL LOW (ref 13.0–17.0)
Hemoglobin: 9.2 g/dL — ABNORMAL LOW (ref 13.0–17.0)
Hemoglobin: 8.2 g/dL — ABNORMAL LOW (ref 13.0–17.0)
Hemoglobin: 8.2 g/dL — ABNORMAL LOW (ref 13.0–17.0)
O2 Saturation: 96 %
O2 Saturation: 95 %
O2 Saturation: 94 %
O2 Saturation: 87 %
O2 Saturation: 90 %
Patient temperature: 37
Patient temperature: 36.9
Patient temperature: 36.8
Patient temperature: 36.6
Patient temperature: 37
Potassium: 4.7 mmol/L (ref 3.5–5.1)
Potassium: 4.7 mmol/L (ref 3.5–5.1)
Potassium: 5 mmol/L (ref 3.5–5.1)
Potassium: 4.4 mmol/L (ref 3.5–5.1)
Potassium: 4.4 mmol/L (ref 3.5–5.1)
Sodium: 140 mmol/L (ref 135–145)
Sodium: 140 mmol/L (ref 135–145)
Sodium: 143 mmol/L (ref 135–145)
Sodium: 141 mmol/L (ref 135–145)
Sodium: 142 mmol/L (ref 135–145)
TCO2: 29 mmol/L (ref 22–32)
TCO2: 31 mmol/L (ref 22–32)
TCO2: 31 mmol/L (ref 22–32)
TCO2: 32 mmol/L (ref 22–32)
TCO2: 30 mmol/L (ref 22–32)
pCO2 arterial: 51.4 mmHg — ABNORMAL HIGH (ref 32.0–48.0)
pCO2 arterial: 50 mmHg — ABNORMAL HIGH (ref 32.0–48.0)
pCO2 arterial: 58.2 mmHg — ABNORMAL HIGH (ref 32.0–48.0)
pCO2 arterial: 51.4 mmHg — ABNORMAL HIGH (ref 32.0–48.0)
pCO2 arterial: 51.2 mmHg — ABNORMAL HIGH (ref 32.0–48.0)
pH, Arterial: 7.329 — ABNORMAL LOW (ref 7.350–7.450)
pH, Arterial: 7.376 (ref 7.350–7.450)
pH, Arterial: 7.314 — ABNORMAL LOW (ref 7.350–7.450)
pH, Arterial: 7.379 (ref 7.350–7.450)
pH, Arterial: 7.353 (ref 7.350–7.450)
pO2, Arterial: 91 mmHg (ref 83.0–108.0)
pO2, Arterial: 81 mmHg — ABNORMAL LOW (ref 83.0–108.0)
pO2, Arterial: 77 mmHg — ABNORMAL LOW (ref 83.0–108.0)
pO2, Arterial: 55 mmHg — ABNORMAL LOW (ref 83.0–108.0)
pO2, Arterial: 62 mmHg — ABNORMAL LOW (ref 83.0–108.0)

## 2020-10-03 LAB — BPAM RBC
Blood Product Expiration Date: 202202012359
Blood Product Expiration Date: 202202012359
Blood Product Expiration Date: 202202012359
Blood Product Expiration Date: 202202012359
Blood Product Expiration Date: 202202032359
ISSUE DATE / TIME: 202201010719
ISSUE DATE / TIME: 202201010719
ISSUE DATE / TIME: 202201030746
Unit Type and Rh: 5100
Unit Type and Rh: 5100
Unit Type and Rh: 5100
Unit Type and Rh: 5100
Unit Type and Rh: 5100

## 2020-10-03 LAB — URINALYSIS, ROUTINE W REFLEX MICROSCOPIC
Bilirubin Urine: NEGATIVE
Glucose, UA: NEGATIVE mg/dL
Ketones, ur: NEGATIVE mg/dL
Nitrite: POSITIVE — AB
Protein, ur: 30 mg/dL — AB
Specific Gravity, Urine: 1.02 (ref 1.005–1.030)
pH: 5.5 (ref 5.0–8.0)

## 2020-10-03 LAB — URINALYSIS, MICROSCOPIC (REFLEX)
RBC / HPF: >50 RBC/hpf (ref 0–5)
Squamous Epithelial / HPF: NONE SEEN (ref 0–5)

## 2020-10-03 LAB — CBC
HCT: 36.2 % — ABNORMAL LOW (ref 39.0–52.0)
HCT: 26.4 % — ABNORMAL LOW (ref 39.0–52.0)
HCT: 23.2 % — ABNORMAL LOW (ref 39.0–52.0)
Hemoglobin: 11.7 g/dL — ABNORMAL LOW (ref 13.0–17.0)
Hemoglobin: 8.4 g/dL — ABNORMAL LOW (ref 13.0–17.0)
Hemoglobin: 7.7 g/dL — ABNORMAL LOW (ref 13.0–17.0)
MCH: 30.9 pg (ref 26.0–34.0)
MCH: 30.8 pg (ref 26.0–34.0)
MCH: 31.7 pg (ref 26.0–34.0)
MCHC: 32.3 g/dL (ref 30.0–36.0)
MCHC: 31.8 g/dL (ref 30.0–36.0)
MCHC: 33.2 g/dL (ref 30.0–36.0)
MCV: 95.5 fL (ref 80.0–100.0)
MCV: 96.7 fL (ref 80.0–100.0)
MCV: 95.5 fL (ref 80.0–100.0)
Platelets: 95 10*3/uL — ABNORMAL LOW (ref 150–400)
Platelets: 133 10*3/uL — ABNORMAL LOW (ref 150–400)
Platelets: 127 10*3/uL — ABNORMAL LOW (ref 150–400)
RBC: 3.79 MIL/uL — ABNORMAL LOW (ref 4.22–5.81)
RBC: 2.73 MIL/uL — ABNORMAL LOW (ref 4.22–5.81)
RBC: 2.43 MIL/uL — ABNORMAL LOW (ref 4.22–5.81)
RDW: 16.4 % — ABNORMAL HIGH (ref 11.5–15.5)
RDW: 16.5 % — ABNORMAL HIGH (ref 11.5–15.5)
RDW: 16.1 % — ABNORMAL HIGH (ref 11.5–15.5)
WBC: 5.1 10*3/uL (ref 4.0–10.5)
WBC: 8.2 10*3/uL (ref 4.0–10.5)
WBC: 6.5 10*3/uL (ref 4.0–10.5)
nRBC: 0 % (ref 0.0–0.2)
nRBC: 0 % (ref 0.0–0.2)
nRBC: 0 % (ref 0.0–0.2)

## 2020-10-03 LAB — POCT I-STAT EG7
Acid-Base Excess: 1 mmol/L (ref 0.0–2.0)
Bicarbonate: 27.8 mmol/L (ref 20.0–28.0)
Calcium, Ion: 1.33 mmol/L (ref 1.15–1.40)
HCT: 21 % — ABNORMAL LOW (ref 39.0–52.0)
Hemoglobin: 7.1 g/dL — ABNORMAL LOW (ref 13.0–17.0)
O2 Saturation: 62 %
Patient temperature: 36.6
Potassium: 4.2 mmol/L (ref 3.5–5.1)
Sodium: 143 mmol/L (ref 135–145)
TCO2: 29 mmol/L (ref 22–32)
pCO2, Ven: 52.4 mmHg (ref 44.0–60.0)
pH, Ven: 7.33 (ref 7.250–7.430)
pO2, Ven: 34 mmHg (ref 32.0–45.0)

## 2020-10-03 LAB — GLUCOSE, CAPILLARY
Glucose-Capillary: 108 mg/dL — ABNORMAL HIGH (ref 70–99)
Glucose-Capillary: 113 mg/dL — ABNORMAL HIGH (ref 70–99)
Glucose-Capillary: 114 mg/dL — ABNORMAL HIGH (ref 70–99)
Glucose-Capillary: 120 mg/dL — ABNORMAL HIGH (ref 70–99)
Glucose-Capillary: 128 mg/dL — ABNORMAL HIGH (ref 70–99)
Glucose-Capillary: 81 mg/dL (ref 70–99)

## 2020-10-03 LAB — APTT
aPTT: 54 seconds — ABNORMAL HIGH (ref 24–36)
aPTT: 57 seconds — ABNORMAL HIGH (ref 24–36)
aPTT: 59 seconds — ABNORMAL HIGH (ref 24–36)

## 2020-10-03 LAB — FIBRINOGEN: Fibrinogen: 415 mg/dL (ref 210–475)

## 2020-10-03 LAB — LACTIC ACID, PLASMA
Lactic Acid, Venous: 0.5 mmol/L (ref 0.5–1.9)
Lactic Acid, Venous: 0.4 mmol/L — ABNORMAL LOW (ref 0.5–1.9)

## 2020-10-03 LAB — LACTATE DEHYDROGENASE: LDH: 542 U/L — ABNORMAL HIGH (ref 98–192)

## 2020-10-03 LAB — MAGNESIUM: Magnesium: 2.5 mg/dL — ABNORMAL HIGH (ref 1.7–2.4)

## 2020-10-03 MED ORDER — OXYCODONE HCL 5 MG/5ML PO SOLN
15.0000 mg | Freq: Four times a day (QID) | ORAL | Status: DC
  Administered 2020-10-03 – 2020-10-06 (×14): 15 mg
  Filled 2020-10-03 (×14): qty 15

## 2020-10-03 MED ORDER — PIVOT 1.5 CAL PO LIQD: 1000.0000 mL | ORAL | Status: DC

## 2020-10-03 MED ORDER — FUROSEMIDE 10 MG/ML IJ SOLN
40.0000 mg | Freq: Once | INTRAMUSCULAR | Status: AC
  Administered 2020-10-03: 40 mg via INTRAVENOUS
  Filled 2020-10-03: qty 4

## 2020-10-03 MED ORDER — NOREPINEPHRINE 4 MG/250ML-% IV SOLN
INTRAVENOUS | Status: AC
  Filled 2020-10-03: qty 250

## 2020-10-03 NOTE — Plan of Care (Signed)
  Problem: Fluid Volume: Goal: Hemodynamic stability will improve Outcome: Progressing   Problem: Clinical Measurements: Goal: Diagnostic test results will improve Outcome: Progressing Goal: Signs and symptoms of infection will decrease Outcome: Progressing   Problem: Nutrition: Goal: Adequate nutrition will be maintained Outcome: Progressing

## 2020-10-03 NOTE — Progress Notes (Signed)
ANTICOAGULATION CONSULT NOTE  Pharmacy Consult for bivalirudin Indication: ECMO + bilateral DVTs + [facility] 12/26   Recent Labs    10/02/20 1600 10/02/20 1613 10/03/20 0320 10/03/20 0434 10/03/20 1133 10/03/20 1227 10/03/20 1656  HGB  --    < > 11.7*  7.8* 8.4* 9.2*  --  7.7*  HCT  --    < > 36.2*  23.0* 26.4* 27.0*  --  23.2*  PLT  --    < > 95* 133*  --   --  127*  APTT  --    < > 54*  --   --  57* 59*  CREATININE 1.34*  --  1.28*  --   --   --  1.21   < > = values in this interval not displayed.    Estimated Creatinine Clearance: 77.3 mL/min (by C-G formula based on SCr of 1.21 mg/dL).   Assessment: 83 yom unvaccinated presenting with severe COVID ARDs, intubated on 12/6, continued to have ventilation issues despite NMB and proning - started on VV ECMO on 12/8. Pt noted to have bilateral DVTs 12/8. Bivalirudin started for ECMO anticoagulation. Head CT 12/26 with new small SAH, repeat 12/27 stable without expansion.  APTT tonight is stable at 59 seconds, H/H down likely from recent hematuria which is now resolved.  Goal of Therapy:  aPTT 50-70 seconds  Monitor platelets by anticoagulation protocol: Yes   Plan:  Continue bivalirudin at 0.04 mg/kg/hr Recheck aPTTs every 5a & 5p with ECMO labs.    Fredonia Highland, PharmD, BCPS, [facility] Clinical Pharmacist [phone] Please check AMION for all [facility] Pharmacy numbers 10/03/2020

## 2020-10-03 NOTE — Progress Notes (Signed)
Physical Therapy Treatment Patient Details Name: Donald Rowe MRN: [id number] DOB: [birth year] Today's Date: 10/03/2020    History of Present Illness 60 y.o. male with a pertinent history of hypothyroidism on levothyroxine and hyperlipidemia who was diagnosed with Covid 11/27 and is not vaccinated.  With EMS SaO2 on RA 78%O2. ED patient was increased to 6 L oxygen satting about 92%,. CTA of the Chest was assessed and negative for pulmonary embolism but showed diffuse bilateral infiltrates. He was treated with IV remdesivir, steroids and baricitinib.  Initially, he was also treated with antibiotics but these were stopped on 12/6 with a low procalcitonin. 12/07 intubated, central line, A line, 12/08 ECMO cannulation, cortrak, attempted extubation 12/9 failed reintubated. Trach placed 12/16. CCRT started 12/26.    PT Comments    Pt continues to have limited participation due to sedation. During tilt pt with increased trunk flexion activation in attempts to sit up, increased lateral lean L due to weakness. Pt demonstrating consistent right quad activation in tilt with manual cueing, inconsistent and fatgiued easily on L. Pt continues to demonstrate deficits in strength, coordination, balance, endurance and safety and will benefit from skilled PT to address deficits to maximize independence with functional mobility prior to discharge.    Follow Up Recommendations  CIR     Equipment Recommendations       Recommendations for Other Services       Precautions / Restrictions Precautions Precautions: Other (comment) Precaution Comments: ECMO, cortrak, vent via trach, cath, CRRT via femoral catheter Restrictions Weight Bearing Restrictions: No    Mobility  Bed Mobility      performed tilt bed to 36 degrees for 8 minutes, pt BP 150/74, HR 107 and O2 94% on vent with PEEP of 10 40% FiO2. Pt with increased trunk flexion activation and lateral lean L requiring manual assist to maintain posture and  alignment. Mini squats performed while in tilt. Pt becoming increasingly agitated with increased trunk flexion activation with continued tilt            Transfers                    Ambulation/Gait                 Stairs             Wheelchair Mobility    Modified Rankin (Stroke Patients Only)       Balance                                            Cognition                                              Exercises General Exercises - Lower Extremity Ankle Circles/Pumps: PROM;Both;10 reps;Supine Mini-Sqauts: AAROM;10 reps;Both;Other (comment) (tilt bed 36 degrees, RLE with consistent activation wtih cueing, LLE inconsistent activation and fatigued)    General Comments        Pertinent Vitals/Pain      Home Living                      Prior Function            PT Goals (current goals can now be found in the  care plan section) Acute Rehab PT Goals Patient Stated Goal: unable PT Goal Formulation: Patient unable to participate in goal setting Time For Goal Achievement: 10/22/2020 Potential to Achieve Goals: Poor Additional Goals Additional Goal #1: Pt to tolerate tilt of 45 degrees for up to 20 min 3 x day. Progress towards PT goals: Not progressing toward goals - comment (pt wtih limited participation due to sedation)    Frequency    Min 2X/week      PT Plan Current plan remains appropriate    Co-evaluation              AM-PAC PT "6 Clicks" Mobility   Outcome Measure  Help needed turning from your back to your side while in a flat bed without using bedrails?: Total Help needed moving from lying on your back to sitting on the side of a flat bed without using bedrails?: Total Help needed moving to and from a bed to a chair (including a wheelchair)?: Total Help needed standing up from a chair using your arms (e.g., wheelchair or bedside chair)?: Total Help needed to walk in  hospital room?: Total Help needed climbing 3-5 steps with a railing? : Total 6 Click Score: 6    End of Session Equipment Utilized During Treatment: Other (comment) (Kreg bed) Activity Tolerance: Patient limited by fatigue;Patient limited by lethargy Patient left: with nursing/sitter in room; restraints reapplied Nurse Communication: Other (comment) (education to RN to perform mini squats while in tilt for quad activation) PT Visit Diagnosis: Muscle weakness (generalized) (M62.81);Difficulty in walking, not elsewhere classified (R26.2)     Time: 9892-1194 PT Time Calculation (min) (ACUTE ONLY): 26 min  Charges:  $Therapeutic Exercise: 8-22 mins $Therapeutic Activity: 8-22 mins                     Ginette Otto, DPT Acute Rehabilitation Services [phone]   Lucretia Field 10/03/2020, 1:14 PM

## 2020-10-03 NOTE — Progress Notes (Signed)
This chaplain is bedside with the Pt. for a moment of reflection and prayer requested by Pt. wife-[name].    F/U spiritual care is available as needed.

## 2020-10-03 NOTE — Progress Notes (Signed)
Patient's Cardiohelp changed out due to scheduled service. Physician, Perfusion, ECMO Specialist, ECMO Coordinator, Primary Nurse and Respritory Therapist at beside. Emergency blood and equipment available. Oxygenator transferred to loaner Cardiohelp at 1700pm; patients O2 sats remained above 90%. Transfer was successfully completed within 20 secs. Patients O2 stats at 95% upon transfer.

## 2020-10-03 NOTE — Progress Notes (Signed)
NAME:  Donald Rowe, MRN:  [id number], DOB:  07/06/1961, LOS: 78 ADMISSION DATE:  09/12/2020, CONSULTATION DATE:  12/6 REFERRING MD:  Dr. Aileen Fass, CHIEF COMPLAINT:  SOB    Brief History   59 y/o M admitted 12/4 with 1 week hx of SOB, known COVID positive.  He is unvaccinated.  CTA chest negative for PE but demonstrated diffuse bilateral infiltrates. [place] started on 12/8   Past Medical History  Hypothyroidism  [facility] Events/Procedures  12/04 Admit  12/06 PCCM consulted  12/07 Intubated, central line, a line 12/08 VV ECMO Cannulaton, cortrak 12/13 circuit change for hemolytic anemia 12/16 Tracheostomy  12/17 bronch w/ BAL  Consults:  PCCM, Heart failure, TCTS, ECMO  Significant Diagnostic Tests:   CTA Chest 12/4 >> extensive bilateral airspace disease, no large PE identified, limited study   LE Venous Duplex 12/4 >> negative for DVT bilaterally   LE venous duplex 12/8> BLE DVTs  RUE venous duplex 12/23  RUE arterial duplex 12/23  CT head 12/26 : Minimal SAH on left side  CT abdomen and pelvis 12/26: Extensive bilateral airspace disease with small pleural effusion  CT head 12/27: Subarachnoid unchanged  Chest x-ray 1/1: Significant improvement in airspace disease  Micro Data:  COVID 12/4 >> negative  Influenza A/B 12/4 >> negative  MRSA PCR 12/5 >> negative  BCx2 12/4 >> NG Trach aspirate 12/12> rare MRSA BAL 12/17> MRSA, candida Aspergillus Ag BAL 12/17> 0.05 12/18 sars2: positive  Antimicrobials:  Azithromycin 12/5 >> 12/6  Ceftriaxone 12/5 >> 12/6  Cefepime 12/7>12/14 vanc 12/14>12/20 Meropenem 12/17>12/21  Interim history/subjective:  Off all IV sedatives this morning, not awake yet. Had severe agitation with uncontrolled HTN not responsive to PRN meds leading to precedex infusion being restarted yesterday. Tolerating TF slowly increasing.  Objective   Blood pressure (!) 143/67, pulse 91, temperature 98.7 F (37.1 C),  temperature source Oral, resp. rate 18, height _0  (1.753 m), weight 101.8 kg, SpO2 96 %.    Vent Mode: PCV FiO2 (%):  [40 %] 40 % Set Rate:  [12 bmp] 12 bmp PEEP:  [10 cmH20] 10 cmH20   Intake/Output Summary (Last 24 hours) at 10/03/2020 0829 Last data filed at 10/03/2020 0700 Gross per 24 hour  Intake 2924.73 ml  Output 4000 ml  Net -1075.27 ml   Filed Weights   10/01/20 0500 10/02/20 0500 10/03/20 0500  Weight: 101.2 kg 100.6 kg 101.8 kg    Examination: Gen: critically ill appearing man, on MV & ECMO HEENT: [place]/AT, eyes anicteric. NGT in place. Neck: Supple, RIJ ECMO cannula. Tracheostomy in place without bleeding or drainage. CV: RRR, no murmur Pulm: Rhales bilaterally, tachypneic breathing above the vent. Pplat 21. Vt ~350 on PS 10. Small amount of tan secretions from trach. Abd: soft, NT, ND Extm: Improving edema, no clubbing or cyanosis. Compression stockings in place. Skin: warm, dry, no rashes.  Neuro: RASS -5, PERRL. Strong cough reflex and moving arms spontaneously.  CXR personally reviewed> less aerated lungs especially on the left.   Resolved Hospital Problem list     Assessment & Plan:   Acute hypoxemic and hypercarbic respiratory failure secondary to COVID PNA with severe ARDS. S/p VV ECMO cannulation 09/20/2020.  S/p tracheostomy 66/06 complicated by trach site bleeding improved Trivial left-sided subarachnoid hemorrhage Community-acquired pneumonia with MRSA- completed treatment. -Completed remdesivir, solumedrol, and baricitnib. -Con't full ECMO support, weaning sweep as tolerated -Completed for treatment with 4 sessions of Plex for hemolysis -Furosemide BID for goal  euvolemia -Completed 7 days vanc for MRSA pneumonia; monitor off antibiotics. Currently WBC stable, afebrile. -Repeat CT chest tomorrow to evaluate for signs of architectural change to suggest fibrosis, monitor pneumomediastinum.  Agitated delirium requiring titration of IV sedation.  Periods  of severe agitation are limiting ability to wean ECMO -Off fentanyl and precedex infusions. -Continue enteral seroquel and clonazepam. Increasing enteral oxycodone.  Bilateral lower ext DVTs- provoked by COVID infection -Continue bivalirudin; goal PTT 50-70 -needs 3 months AC for provoked DVT   Steroid-induced hyperglycemia- controlled  -SSI PRN -goal BG 140-180  Hypothyroidism -con't PTA synthroid   Labile hypertension, contributed by periods of agitation.  Hypotension after meds. -space out sedating meds -con't clonidine, hydralazine, and metoprolol -trying to moderate agitation with increased oral agents and IV PRNs to improve BP and agitation control.  Hemolytic anemia - improved. More expected down trending of Hb with expected losses associated with ECMO Throbocytopenia Hyperbilirubinemia Rheumatologic-associated MAHA remains in differential.  hgb and plt are stable -Completed treatment with the Plex for 4 sessions -Transfuse if hgb <8  Vomiting -con't reglan -con't uptitrating TF to 30cc/h  SAH- suspect hypertensive in etiology -adjusted PTT goals 50-70 -reimage tomorrow  Best practice (evaluated daily)  Diet: TF- uptitrating since vomiting Pain/Anxiety/Delirium protocol (if indicated): PAD protocol, limit IV meds VAP protocol (if indicated): yes DVT prophylaxis: Bivalirudin GI prophylaxis: pantoprazole  BID Glucose control: SSI Mobility: As tolerated Last date of multidisciplinary goals of care discussion: wife updated at bedside 1/3 Summary of discussion con't aggressive care Follow up goals of care discussion due:  Code Status: Full Disposition: ICU  This patient is critically ill with multiple organ system failure which requires frequent high complexity decision making, assessment, support, evaluation, and titration of therapies. This was completed through the application of advanced monitoring technologies and extensive interpretation of multiple databases.  During this encounter critical care time was devoted to patient care services described in this note for 55 minutes. Multidisciplinary ECMO rounds completed.   [name], DO 10/03/20 8:39 AM [facility]

## 2020-10-03 NOTE — Progress Notes (Signed)
   Palliative Medicine Inpatient Follow Up Note  Reason for consult:  "ECMO"  HPI:  Per intake H&P --> Donald Rowe a 59 y.o.malewith a pertinent history ofhypothyroidism on levothyroxine and hyperlipidemia who was diagnosed with Covid 1 week ago and is not vaccinated. With EMS he was seen to be desaturating to 78% on room air. He states he started having symptoms on Friday and tested positive on Saturday.  Had initially elected to be DNAR without intubation though later changed this and got intubated on 12/6  in the setting of worsening respiratory failure with hypoxia. CTA chest w/ diffuse bilateral infiltrates. On 12/8 was cannulated for VV ECMO. Had been extubated on 12/9 then emergently reintubated in early evening due to clinical instability.   Palliative care was asked to get involved in the setting of serious illness/ ECMO to set goals and expectations   Today's Discussion (10/03/2020): Chart reviewed.  Attended ECMO rounds.  Plan by the team to place an NGT for decompression and initiation of reglan. Hgb has been stable.Edema is improving gradually has been on CVVH d/c 09-30-20. Anemia is stable -  Patient remains to have complicated ICU associated delirium on fentanyl gtt and Seroquel and precidex    Patients wife, [name] updated over the phone. She was provided with a comprehensive update regarding patients clinical state. We discussed his present and ongoing delirium and utilization of mediations and their side effect Current need for Precedex and Fentanyl infusion for agitation a nd tachypnea  Created space and opportunity for [name] to explore her thoughts and feeling regarding her husbands current medical situation.  She understands the seriousness of the current medical situation and her husband's fragility   Emotional support offered  Questions and concerns addressed    SUMMARY OF RECOMMENDATIONS Full Code/Full Scope of Care  ECMO as a bridge to  recovery  Spiritual support appreciated  Ongoing PMT support    Time Spent: 35 Greater than 50% of the time was spent in counseling and coordination of care ______________________________________________________________________________________ Lorinda Creed NP [facility] Palliative Medicine Team Team Cell Phone: [phone] Please utilize secure chat with additional questions, if there is no response within 30 minutes please call the above phone number  Palliative Medicine Team providers are available by phone from 7am to 7pm daily and can be reached through the team cell phone.  Should this patient require assistance outside of these hours, please call the patient's attending physician.

## 2020-10-03 NOTE — Progress Notes (Addendum)
ANTICOAGULATION CONSULT NOTE  Pharmacy Consult for bivalirudin Indication: ECMO + bilateral DVTs + [facility] 12/26   Recent Labs    10/02/20 0401 10/02/20 0411 10/02/20 1600 10/02/20 1613 10/02/20 1614 10/02/20 1949 10/03/20 0320 10/03/20 0434  HGB 7.7*   < >  --  8.4*   < > 8.8* 11.7*  7.8* 8.4*  HCT 24.3*   < >  --  25.8*   < > 26.0* 36.2*  23.0* 26.4*  PLT 117*   < >  --  113*  --   --  95* 133*  APTT 55*  --   --  55*  --   --  54*  --   CREATININE 1.34*  --  1.34*  --   --   --  1.28*  --    < > = values in this interval not displayed.    Estimated Creatinine Clearance: 73 mL/min (A) (by C-G formula based on SCr of 1.28 mg/dL (H)).   Assessment: 52 yom unvaccinated presenting with severe COVID ARDs, intubated on 12/6, continued to have ventilation issues despite NMB and proning - started on VV ECMO on 12/8. Pt noted to have bilateral DVTs 12/8. Bivalirudin started for ECMO anticoagulation. Head CT 12/26 with new small SAH, repeat 12/27 stable without expansion.  APTT this morning remains within goal range at 54 seconds, on bivalirudin@0 .04 mg/kg/hr. Hgb 8.4, plt 133, fibrinogen 415, LDH 542. No s/sx of bleeding- does still have unchanged black tarry stools (monitor).  Goal of Therapy:  aPTT 50-70 seconds  Monitor platelets by anticoagulation protocol: Yes   Plan:  Continue bivalirudin at 0.04 mg/kg/hr Recheck aPTTs every 5a & 5p with ECMO labs.  Sherron Monday, PharmD, BCCCP Clinical Pharmacist  Phone: [phone] 10/03/2020 8:05 AM  Please check AMION for all [facility] Pharmacy phone numbers After 10:00 PM, call Main Pharmacy [phone]  ADDENDUM APTT came back therapeutic at 57, on bivalirudin@0 .04 mg/kg/hr. Having hematuria and bleeding at trach site - hematuria now improving. Will keep at same rate and monitor level at 1700.  Sherron Monday, PharmD, BCCCP Clinical Pharmacist

## 2020-10-03 NOTE — Procedures (Signed)
Extracorporeal support note     ECLS cannulation: 10/04/2020 Last circuit change: 09/11/2020  Indication: COVD ARDS   Configuration: VV, RIJ 32Fr [place]   Pump speed: 3200 Pump flow: 4.2 L Pump used: Cardiohelp   Sweep gas: 100%, 5.5 LPM   Circuit check: clot only in corners of oxygenator- no change since yesterday Anticoagulant: bivalirudin   Changes in support: Con't aggressive care. Monitor off antibiotics. PTT goal 50-70. Increasing oral meds, avoiding IV infusions. Repeat Ct chest and head tomorrow.  anticipated goals/duration of support: Bridge to recovery, day 27 of support.  Steffanie Dunn, DO 10/03/20 8:26 AM [facility]

## 2020-10-03 NOTE — Progress Notes (Signed)
Patient ID: Donald Rowe, male   DOB: [birth year], 60 y.o.   MRN: [id number]    Advanced Heart Failure Rounding Note   Subjective:    12/8 Cannulated for VV ECMO - 32 FR  RIJ [place] 12/9 Extubated/Reintubated for altered mental status 12/13 Circuit change for elevated LDH 12/15 PLEX started 12/16 Tracheostomy 12/17 MRSA in sputum (negative cx 12/26) 12/23 head CT (normal) for change in mental status.  12/26 Pan CT for drop in hgb. Small SAH. Severe lung disease 12/27 CVVH begun 1/1 CVVH stopped  More agitated overnight with severe HTN and had to increase sedation. This am, wil respond to pain but not following commands.   CXR with severe diffuse infiltrates. No change. Trach secretions increased.    ECMO   Speed 3200 Flow 4.22 L Sweep 5.5 dP 22 pVen -69  ABG: 7.32/51/91/96% Hgb 8.4 LDH 518 -> 488 -> 514 -> 498 -> 539 -> 542 Lactic acid 0.5 PTT 54  Vent 40%  TV ~400cc  Objective:   Weight Range:  Vital Signs:   Temp:  [98.4 F (36.9 C)-98.7 F (37.1 C)] 98.7 F (37.1 C) (01/04 0400) Pulse Rate:  [84-133] 91 (01/04 0700) Resp:  [12-41] 18 (01/04 0700) BP: (98-182)/(51-118) 143/67 (01/04 0700) SpO2:  [91 %-99 %] 96 % (01/04 0700) Arterial Line BP: (106-205)/(48-86) 169/62 (01/04 0700) FiO2 (%):  [40 %] 40 % (01/04 0434) Weight:  [101.8 kg] 101.8 kg (01/04 0500) Last BM Date: 10/01/20  Weight change: Filed Weights   10/01/20 0500 10/02/20 0500 10/03/20 0500  Weight: 101.2 kg 100.6 kg 101.8 kg    Intake/Output:   Intake/Output Summary (Last 24 hours) at 10/03/2020 9563 Last data filed at 10/03/2020 0700 Gross per 24 hour  Intake 2924.73 ml  Output 4000 ml  Net -1075.27 ml     Physical Exam: General:  Sedated on vent  HEENT: normal Neck: supple. RIJ ECMO. + trach Carotids 2+ bilat; no bruits. No lymphadenopathy or thryomegaly appreciated. Cor: PMI nondisplaced. Tachy regular Lungs: diffuse rhonchi  Abdomen: soft, nontender, mildly distended. No  hepatosplenomegaly. No bruits or masses. Good bowel sounds. Extremities: no cyanosis, clubbing, rash, tr edema  + compression hose Neuro:sedated responds to pain   Telemetry: sinus 95-110 Personally reviewed     Labs: Basic Metabolic Panel: Recent Labs  Lab 09/29/20 0315 09/29/20 0316 09/30/20 0404 09/30/20 0415 10/01/20 0508 10/01/20 0518 10/01/20 1556 10/01/20 1945 10/02/20 0401 10/02/20 0411 10/02/20 1142 10/02/20 1600 10/02/20 1614 10/02/20 1949 10/03/20 0320  NA  --    < > 140   < > 140   < > 139  140   < > 138   < > 140 136 138 139 140  140  K  --    < > 4.9   < > 4.6   < > 4.5  4.5   < > 4.2   < > 4.2 3.8 3.8 4.9 4.6  4.7  CL  --    < > 106   < > 106  --  106  --  104  --   --  100  --   --  106  CO2  --    < > 24   < > 23  --  25  --  24  --   --  25  --   --  26  GLUCOSE  --    < > 73   < > 69*  --  96  --  99  --   --  113*  --   --  111*  BUN  --    < > 49*   < > 64*  --  66*  --  70*  --   --  71*  --   --  70*  CREATININE  --    < > 0.92   < > 1.23  --  1.24  --  1.34*  --   --  1.34*  --   --  1.28*  CALCIUM  --    < > 8.2*   < > 9.0  --  8.9  --  9.0  --   --  8.8*  --   --  8.9  MG 3.1*  --  3.2*  --  3.0*  --   --   --  2.8*  --   --   --   --   --  2.5*  PHOS  --    < > 3.2   < > 4.4  --  6.5*  --  6.4*  --   --  6.2*  --   --  5.5*   < > = values in this interval not displayed.    Liver Function Tests: Recent Labs  Lab 09/27/20 0339 09/27/20 1701 10/01/20 0508 10/01/20 1556 10/02/20 0401 10/02/20 1600 10/03/20 0320  AST 49*  --   --   --  48*  --   --   ALT 50*  --   --   --  46*  --   --   ALKPHOS 138*  --   --   --  141*  --   --   BILITOT 1.4*  --   --   --  1.1  --   --   PROT 5.4*  --   --   --  5.9*  --   --   ALBUMIN 2.8*  2.8*   < > 3.2* 3.2* 3.0*  3.0* 2.9* 2.7*   < > = values in this interval not displayed.   No results for input(s): LIPASE, AMYLASE in the last 168 hours. No results for input(s): AMMONIA in the last 168  hours.  CBC: Recent Labs  Lab 09/28/20 2000 09/28/20 2014 10/02/20 0401 10/02/20 0411 10/02/20 1529 10/02/20 1613 10/02/20 1614 10/02/20 1949 10/03/20 0320 10/03/20 0434  WBC 9.8   < > 11.2*  --  9.7 8.8  --   --  5.1 8.2  NEUTROABS 6.0  --   --   --   --   --   --   --   --   --   HGB 9.1*   < > 7.7*   < > 8.5* 8.4* 7.8* 8.8* 11.7*  7.8* 8.4*  HCT 28.0*   < > 24.3*   < > 26.9* 25.8* 23.0* 26.0* 36.2*  23.0* 26.4*  MCV 94.6   < > 98.4  --  96.4 95.2  --   --  95.5 96.7  PLT 104*   < > 117*  --  122* 113*  --   --  95* 133*   < > = values in this interval not displayed.    Cardiac Enzymes: No results for input(s): CKTOTAL, CKMB, CKMBINDEX, TROPONINI in the last 168 hours.  BNP: BNP (last 3 results) No results for input(s): BNP in the last 8760 hours.  ProBNP (last 3 results) No results for input(s): PROBNP in the last 8760 hours.    Other results:  Imaging:  DG CHEST PORT 1 VIEW  Result Date: 10/03/2020 CLINICAL DATA:  Tracheostomy.  ECMO.  Recent COVID. EXAM: PORTABLE CHEST 1 VIEW COMPARISON:  10/02/2020. FINDINGS: Tracheostomy tube, NG tube, left PICC line, ECMO device in stable position. Heart size stable. Low lung volumes. Diffuse severe bilateral pulmonary infiltrates again noted without interim change. No pleural effusion or pneumothorax. IMPRESSION: 1. Lines and tubes in stable position. 2. Low lung volumes. Diffuse severe bilateral pulmonary infiltrates again noted without interim change. Electronically Signed   By: Marcello Moores  Register   On: 10/03/2020 06:05   DG CHEST PORT 1 VIEW  Result Date: 10/02/2020 CLINICAL DATA:  Respiratory failure. EXAM: PORTABLE CHEST 1 VIEW COMPARISON:  10/01/2020 FINDINGS: 0530 hours. Tracheostomy tube again noted. The NG tube passes into the stomach although the distal tip position is not included on the film. ECMO cannula remains in place. Left PICC line tip overlies the proximal to mid SVC level. Diffuse bilateral airspace disease is  similar. IMPRESSION: 1. No substantial interval change in cardiopulmonary exam. 2. Stable support apparatus. Electronically Signed   By: Misty Stanley M.D.   On: 10/02/2020 06:17     Medications:     Scheduled Medications: . amLODipine  10 mg Per Tube QHS  . B-complex with vitamin C  1 tablet Per Tube Daily  . chlorhexidine gluconate (MEDLINE KIT)  15 mL Mouth Rinse BID  . Chlorhexidine Gluconate Cloth  6 each Topical Daily  . clonazePAM  2 mg Per Tube Q8H  . cloNIDine  0.1 mg Per Tube TID  . docusate  100 mg Per Tube BID  . feeding supplement (PROSource TF)  45 mL Per Tube QID  . fiber  1 packet Per Tube BID  . insulin aspart  2-6 Units Subcutaneous Q4H  . levothyroxine  50 mcg Per Tube Q0600  . mouth rinse  15 mL Mouth Rinse 10 times per day  . metoCLOPramide (REGLAN) injection  10 mg Intravenous Q6H  . metoprolol tartrate  50 mg Per Tube Q12H  . nystatin  5 mL Per Tube QID  . oxyCODONE  15 mg Per Tube Q6H  . pantoprazole sodium  40 mg Per Tube BID  . polyethylene glycol  17 g Per Tube BID  . pravastatin  40 mg Per Tube q1800  . QUEtiapine  150 mg Per Tube TID  . sodium chloride flush  10-40 mL Intracatheter Q12H    Infusions: . sodium chloride 250 mL (09/30/20 1447)  . albumin human 12.5 g (09/29/20 1644)  . bivalirudin (ANGIOMAX) infusion 0.5 mg/mL (Non-ACS indications) 0.04 mg/kg/hr (10/03/20 0700)  . dexmedetomidine (PRECEDEX) IV infusion Stopped (10/03/20 0962)  . feeding supplement (PIVOT 1.5 CAL) 1,000 mL (10/02/20 1756)  . fentaNYL infusion INTRAVENOUS 100 mcg/hr (10/03/20 0700)  . niCARDipine Stopped (09/30/20 0833)    PRN Medications: acetaminophen (TYLENOL) oral liquid 160 mg/5 mL, albumin human, albuterol, diphenhydrAMINE, fentaNYL, Gerhardt's butt cream, guaiFENesin-dextromethorphan, haloperidol lactate, labetalol, lip balm, LORazepam, midazolam, ondansetron **OR** ondansetron (ZOFRAN) IV, phenylephrine, sodium chloride flush   Assessment/Plan:   1. Acute  hypoxic respiratory failure/ARDS in setting of COVID-19 PNA - Cannulated for VV ECMO on 12/8 after failing intubation x 2 days - has complete remedisivir, baricitinib, Solumedrol.  - s/p trach 12/16 - CT chest 12/26 with severe lung disease and pneumomediastinum.  - 12/17 MRSA in sputum (negative cx 12/26) - Restarted vanc/cefepime 12/26 => now completed course.  - Circuit changed 12/13 for elevated LDH and lower flows. PLEX started 12/15. S/p PLEX #4/4  on 12/21. LDH has been stable but pending today. Circuit parameters stable.   - CVVH started 12/27 with intractable volume overload, stopped 1/1.  Now respoding well to IV lasix. Weight down. Creatinine stable. L:asix on hold today. Will give a dose this afternoon prior to CT tomorrow to optimize lung fields. - Bivalirudin with PTT goal back up to 50-70. PTT 55 today - Two main issues currently. 1) CXR without much improvement over 4 weeks of ECMO support/lung rest. Concern for possible permanent architectual lung damage. I have d/w with CCM who agree. Will plan repeat CT chest tomorrow to help prognosticate further lung recovery. 2) severe agitation with marked HTN - continue to adjust sedatives though we have not been able to find a happy medium despite multiple strategies.  - I am growing increasingly concerned that liberating him from ECMO with any quality of life may not be attainable despite all efforts.   2. Bilateral DVT - remains on bivalirudin - Discussed dosing with PharmD personally.  3. DM2 - Insulin adjusted. CBGs improved   4. F/E/N - Had ileus over weekend. Reglan back on. Now tolerating trickle feeds.   6. HTN - Remains very labile  - Meds adjusted again to avoid hypotension  7. Rectal bleeding - much improved  8. AKI - Now off CVVH, Creatinine 1.28 today  9. Acute encephalopathy - will respond to pain and occasionally follow simple commands. Full assessment limited by severe agitation and need for sedation to  protect circuit.  - head CT ok 09/07/20 and 12/21 - head CT 12/26. Small SAH.  Repeat CT on 12/27 with unchanged small SAH.   10. Small SAH - on CT 12/26 - repeat CT 12/27 unchanged small SAH.  - PTT goal reduced 40-50 for several days, plan to go back to 50-70 today.  - avoid severe HTN as best as possible - repeat head CT tomorrow with chest CT  11. Gastric ileus - slighly improved with Reglan. Tolarating trickle feeds.   12. Thrombocytopenia - Stable, mild.   13. Anemia - Transfuse hgb < 8.  - Has been getting periodic blood without overt GI bleeding. No change  Plan discussed on multi-discplinary ECMO rounds with CCM, ECMO coordinator/specialist, PharmDs and RNs.  CRITICAL CARE Performed by: Glori Bickers  Total critical care time: 50 minutes  Critical care time was exclusive of separately billable procedures and treating other patients.  Critical care was necessary to treat or prevent imminent or life-threatening deterioration.  Critical care was time spent personally by me (independent of midlevel providers or residents) on the following activities: development of treatment plan with patient and/or surrogate as well as nursing, discussions with consultants, evaluation of patient's response to treatment, examination of patient, obtaining history from patient or surrogate, ordering and performing treatments and interventions, ordering and review of laboratory studies, ordering and review of radiographic studies, pulse oximetry and re-evaluation of patient's condition.    Length of Stay: 53   Marlise Fahr MD 10/03/2020, 8:22 AM  Advanced Heart Failure Team Pager [phone] (M-F; [place])  Please contact [facility] for night-coverage after hours (4p -7a ) and weekends on [email or web address]

## 2020-10-03 NOTE — Progress Notes (Signed)
Nutrition Follow Up  DOCUMENTATION CODES:   Not applicable  INTERVENTION:   Plan post pyloric Cortrak replacement tomorrow.   Continue tube feeding:  -Pivot 1.5 @ 30 ml/hr via NG -Increase by 10 ml daily to goal rate of 70 ml/hr (1680 ml)  -ProSource TF 45 ml QID  Provides: 2680 kcal, 202 grams protein, 1260 ml free water.   NUTRITION DIAGNOSIS:   Increased nutrient needs related to acute illness (COVID PNA) as evidenced by estimated needs   Ongoing  GOAL:   Patient will meet greater than or equal to 90% of their needs   Addressed via TF  MONITOR:   Vent status,Skin,Weight trends,Labs,I & O's,TF tolerance  REASON FOR ASSESSMENT:   Ventilator,Consult Enteral/tube feeding initiation and management  ASSESSMENT:   Patient with PMH significant for asthma, cancer, CHF, DM, HTN, sickle cell anemia, renal insufficiency, and hyperlipidemia. Presents this admission with severe COVID ARDS.   12/04- admit [facility] 1206- hypoxia, intubated 12/07- transfer to [facility] for ECMO 12/08- VV ECMO cannulation, post pyloric Cortrak placed 12/09- extubated, re-intubated  12/13- circuit change 12/15- started PLEX 12/16- trach 12/27- started CRRT  12/31- vomited, Cortrak dislodged, NG placed 01/01- CRRT stopped   Pt discussed during ICU rounds and with RN.   Precedex restarted due to severe agitation. Not following commands. Plan CT chest/head tomorrow. Tolerating Pivot 1.5 trickles today, increase to 30 ml/hr. On scheduled reglan. Having BMs. Cortrak to be replaced tomorrow. Continue to slowly titrate to goal.   Showing improvement with hypoglycemia now that TF has been restarted.   Admission weight: 101.9 kg  Current weight: 101.8 kg (down from 112.4 kg on 12/27)  Patient requiring ventilator support via trach  MV: 3.4 L/min Temp (24hrs), Avg:98.3 F (36.8 C), Min:97.9 F (36.6 C), Max:98.7 F (37.1 C)  UOP: 4000 ml x 24 hrs   Drips: precedex Medications: colace,  nurtisource  fiber BID, SS novolog, 10 mg reglan QID, miralax Labs: CBG 91-113  Diet Order:   Diet Order    None      EDUCATION NEEDS:   Not appropriate for education at this time  Skin:  Skin Assessment: Skin Integrity Issues: Skin Integrity Issues:: Other (Comment) Other: skin tear- R ear, R arm device related wound  Last BM:  1/3  Height:   Ht Readings from Last 1 Encounters:  09/05/20 5\' 9"  (1.753 m)    Weight:   Wt Readings from Last 1 Encounters:  10/03/20 101.8 kg    Adjusted Body Weight:  84.5 kg   BMI:  Body mass index is 33.14 kg/m.  Estimated Nutritional Needs:   Kcal:  12/01/20 kcal  Protein:  160-205 grams  Fluid:  >/= 2 L/day  [phone] RD, LDN Clinical Nutrition Pager listed in AMION

## 2020-10-04 ENCOUNTER — Inpatient Hospital Stay (HOSPITAL_COMMUNITY): Payer: HRSA Program

## 2020-10-04 DIAGNOSIS — J9601 Acute respiratory failure with hypoxia: Secondary | ICD-10-CM | POA: Diagnosis not present

## 2020-10-04 DIAGNOSIS — J80 Acute respiratory distress syndrome: Secondary | ICD-10-CM | POA: Diagnosis not present

## 2020-10-04 DIAGNOSIS — U071 COVID-19: Secondary | ICD-10-CM | POA: Diagnosis not present

## 2020-10-04 DIAGNOSIS — Z9281 Personal history of extracorporeal membrane oxygenation (ECMO): Secondary | ICD-10-CM | POA: Diagnosis not present

## 2020-10-04 DIAGNOSIS — Z9911 Dependence on respirator [ventilator] status: Secondary | ICD-10-CM | POA: Diagnosis not present

## 2020-10-04 LAB — POCT I-STAT 7, (LYTES, BLD GAS, ICA,H+H)
Acid-Base Excess: 2 mmol/L (ref 0.0–2.0)
Acid-Base Excess: 3 mmol/L — ABNORMAL HIGH (ref 0.0–2.0)
Acid-Base Excess: 3 mmol/L — ABNORMAL HIGH (ref 0.0–2.0)
Acid-Base Excess: 3 mmol/L — ABNORMAL HIGH (ref 0.0–2.0)
Acid-Base Excess: 4 mmol/L — ABNORMAL HIGH (ref 0.0–2.0)
Acid-Base Excess: 6 mmol/L — ABNORMAL HIGH (ref 0.0–2.0)
Bicarbonate: 28.5 mmol/L — ABNORMAL HIGH (ref 20.0–28.0)
Bicarbonate: 29.5 mmol/L — ABNORMAL HIGH (ref 20.0–28.0)
Bicarbonate: 30 mmol/L — ABNORMAL HIGH (ref 20.0–28.0)
Bicarbonate: 30 mmol/L — ABNORMAL HIGH (ref 20.0–28.0)
Bicarbonate: 31.4 mmol/L — ABNORMAL HIGH (ref 20.0–28.0)
Bicarbonate: 31.7 mmol/L — ABNORMAL HIGH (ref 20.0–28.0)
Calcium, Ion: 1.26 mmol/L (ref 1.15–1.40)
Calcium, Ion: 1.28 mmol/L (ref 1.15–1.40)
Calcium, Ion: 1.34 mmol/L (ref 1.15–1.40)
Calcium, Ion: 1.36 mmol/L (ref 1.15–1.40)
Calcium, Ion: 1.36 mmol/L (ref 1.15–1.40)
Calcium, Ion: 1.4 mmol/L (ref 1.15–1.40)
HCT: 22 % — ABNORMAL LOW (ref 39.0–52.0)
HCT: 22 % — ABNORMAL LOW (ref 39.0–52.0)
HCT: 23 % — ABNORMAL LOW (ref 39.0–52.0)
HCT: 25 % — ABNORMAL LOW (ref 39.0–52.0)
HCT: 25 % — ABNORMAL LOW (ref 39.0–52.0)
HCT: 26 % — ABNORMAL LOW (ref 39.0–52.0)
Hemoglobin: 7.5 g/dL — ABNORMAL LOW (ref 13.0–17.0)
Hemoglobin: 7.5 g/dL — ABNORMAL LOW (ref 13.0–17.0)
Hemoglobin: 7.8 g/dL — ABNORMAL LOW (ref 13.0–17.0)
Hemoglobin: 8.5 g/dL — ABNORMAL LOW (ref 13.0–17.0)
Hemoglobin: 8.5 g/dL — ABNORMAL LOW (ref 13.0–17.0)
Hemoglobin: 8.8 g/dL — ABNORMAL LOW (ref 13.0–17.0)
O2 Saturation: 87 %
O2 Saturation: 94 %
O2 Saturation: 94 %
O2 Saturation: 94 %
O2 Saturation: 94 %
O2 Saturation: 94 %
Patient temperature: 36
Patient temperature: 36.6
Patient temperature: 36.8
Patient temperature: 36.9
Patient temperature: 37
Patient temperature: 37.1
Potassium: 4 mmol/L (ref 3.5–5.1)
Potassium: 4.4 mmol/L (ref 3.5–5.1)
Potassium: 4.5 mmol/L (ref 3.5–5.1)
Potassium: 4.5 mmol/L (ref 3.5–5.1)
Potassium: 4.7 mmol/L (ref 3.5–5.1)
Potassium: 4.8 mmol/L (ref 3.5–5.1)
Sodium: 143 mmol/L (ref 135–145)
Sodium: 144 mmol/L (ref 135–145)
Sodium: 144 mmol/L (ref 135–145)
Sodium: 144 mmol/L (ref 135–145)
Sodium: 144 mmol/L (ref 135–145)
Sodium: 145 mmol/L (ref 135–145)
TCO2: 30 mmol/L (ref 22–32)
TCO2: 31 mmol/L (ref 22–32)
TCO2: 32 mmol/L (ref 22–32)
TCO2: 32 mmol/L (ref 22–32)
TCO2: 33 mmol/L — ABNORMAL HIGH (ref 22–32)
TCO2: 34 mmol/L — ABNORMAL HIGH (ref 22–32)
pCO2 arterial: 52.3 mmHg — ABNORMAL HIGH (ref 32.0–48.0)
pCO2 arterial: 52.9 mmHg — ABNORMAL HIGH (ref 32.0–48.0)
pCO2 arterial: 57.6 mmHg — ABNORMAL HIGH (ref 32.0–48.0)
pCO2 arterial: 58.9 mmHg — ABNORMAL HIGH (ref 32.0–48.0)
pCO2 arterial: 64.1 mmHg — ABNORMAL HIGH (ref 32.0–48.0)
pCO2 arterial: 70.9 mmHg (ref 32.0–48.0)
pH, Arterial: 7.253 — ABNORMAL LOW (ref 7.350–7.450)
pH, Arterial: 7.277 — ABNORMAL LOW (ref 7.350–7.450)
pH, Arterial: 7.292 — ABNORMAL LOW (ref 7.350–7.450)
pH, Arterial: 7.317 — ABNORMAL LOW (ref 7.350–7.450)
pH, Arterial: 7.361 (ref 7.350–7.450)
pH, Arterial: 7.387 (ref 7.350–7.450)
pO2, Arterial: 58 mmHg — ABNORMAL LOW (ref 83.0–108.0)
pO2, Arterial: 75 mmHg — ABNORMAL LOW (ref 83.0–108.0)
pO2, Arterial: 77 mmHg — ABNORMAL LOW (ref 83.0–108.0)
pO2, Arterial: 79 mmHg — ABNORMAL LOW (ref 83.0–108.0)
pO2, Arterial: 80 mmHg — ABNORMAL LOW (ref 83.0–108.0)
pO2, Arterial: 82 mmHg — ABNORMAL LOW (ref 83.0–108.0)

## 2020-10-04 LAB — RENAL FUNCTION PANEL
Albumin: 2.3 g/dL — ABNORMAL LOW (ref 3.5–5.0)
Albumin: 2.7 g/dL — ABNORMAL LOW (ref 3.5–5.0)
Anion gap: 6 (ref 5–15)
Anion gap: 5 (ref 5–15)
BUN: 53 mg/dL — ABNORMAL HIGH (ref 6–20)
BUN: 55 mg/dL — ABNORMAL HIGH (ref 6–20)
CO2: 24 mmol/L (ref 22–32)
CO2: 29 mmol/L (ref 22–32)
Calcium: 7.7 mg/dL — ABNORMAL LOW (ref 8.9–10.3)
Calcium: 8.9 mg/dL (ref 8.9–10.3)
Chloride: 111 mmol/L (ref 98–111)
Chloride: 109 mmol/L (ref 98–111)
Creatinine, Ser: 0.99 mg/dL (ref 0.61–1.24)
Creatinine, Ser: 1.16 mg/dL (ref 0.61–1.24)
GFR, Estimated: >60 mL/min (ref >60)
GFR, Estimated: >60 mL/min (ref >60)
Glucose, Bld: 102 mg/dL — ABNORMAL HIGH (ref 70–99)
Glucose, Bld: 114 mg/dL — ABNORMAL HIGH (ref 70–99)
Phosphorus: 3.2 mg/dL (ref 2.5–4.6)
Phosphorus: 4.5 mg/dL (ref 2.5–4.6)
Potassium: 3.8 mmol/L (ref 3.5–5.1)
Potassium: 4.9 mmol/L (ref 3.5–5.1)
Sodium: 141 mmol/L (ref 135–145)
Sodium: 143 mmol/L (ref 135–145)

## 2020-10-04 LAB — CBC
HCT: 23.1 % — ABNORMAL LOW (ref 39.0–52.0)
HCT: 27.1 % — ABNORMAL LOW (ref 39.0–52.0)
Hemoglobin: 7.8 g/dL — ABNORMAL LOW (ref 13.0–17.0)
Hemoglobin: 8.3 g/dL — ABNORMAL LOW (ref 13.0–17.0)
MCH: 32.5 pg (ref 26.0–34.0)
MCH: 30.7 pg (ref 26.0–34.0)
MCHC: 33.8 g/dL (ref 30.0–36.0)
MCHC: 30.6 g/dL (ref 30.0–36.0)
MCV: 96.3 fL (ref 80.0–100.0)
MCV: 100.4 fL — ABNORMAL HIGH (ref 80.0–100.0)
Platelets: 144 10*3/uL — ABNORMAL LOW (ref 150–400)
Platelets: 174 10*3/uL (ref 150–400)
RBC: 2.4 MIL/uL — ABNORMAL LOW (ref 4.22–5.81)
RBC: 2.7 MIL/uL — ABNORMAL LOW (ref 4.22–5.81)
RDW: 16.1 % — ABNORMAL HIGH (ref 11.5–15.5)
RDW: 16.2 % — ABNORMAL HIGH (ref 11.5–15.5)
WBC: 7.5 10*3/uL (ref 4.0–10.5)
WBC: 10.3 10*3/uL (ref 4.0–10.5)
nRBC: 0 % (ref 0.0–0.2)
nRBC: 0 % (ref 0.0–0.2)

## 2020-10-04 LAB — LACTATE DEHYDROGENASE: LDH: 525 U/L — ABNORMAL HIGH (ref 98–192)

## 2020-10-04 LAB — MAGNESIUM: Magnesium: 2 mg/dL (ref 1.7–2.4)

## 2020-10-04 LAB — URINE CULTURE: Culture: 10000 — AB

## 2020-10-04 LAB — GLUCOSE, CAPILLARY
Glucose-Capillary: 105 mg/dL — ABNORMAL HIGH (ref 70–99)
Glucose-Capillary: 114 mg/dL — ABNORMAL HIGH (ref 70–99)
Glucose-Capillary: 131 mg/dL — ABNORMAL HIGH (ref 70–99)
Glucose-Capillary: 107 mg/dL — ABNORMAL HIGH (ref 70–99)
Glucose-Capillary: 146 mg/dL — ABNORMAL HIGH (ref 70–99)

## 2020-10-04 LAB — LACTIC ACID, PLASMA
Lactic Acid, Venous: 0.5 mmol/L (ref 0.5–1.9)
Lactic Acid, Venous: 0.6 mmol/L (ref 0.5–1.9)

## 2020-10-04 LAB — APTT
aPTT: 56 seconds — ABNORMAL HIGH (ref 24–36)
aPTT: 57 seconds — ABNORMAL HIGH (ref 24–36)

## 2020-10-04 LAB — FIBRINOGEN: Fibrinogen: 359 mg/dL (ref 210–475)

## 2020-10-04 MED ORDER — CALCIUM CHLORIDE 10 % IV SOLN
INTRAVENOUS | Status: AC
  Filled 2020-10-04: qty 10

## 2020-10-04 MED ORDER — SODIUM CHLORIDE 0.9 % IV SOLN
2.0000 g | Freq: Three times a day (TID) | INTRAVENOUS | Status: DC
  Administered 2020-10-04 – 2020-10-08 (×11): 2 g via INTRAVENOUS
  Filled 2020-10-04 (×11): qty 2

## 2020-10-04 MED ORDER — VASOPRESSIN 20 UNITS/100 ML INFUSION FOR SHOCK: 0.0000 [IU]/min | INTRAVENOUS | Status: DC

## 2020-10-04 MED ORDER — FUROSEMIDE 10 MG/ML IJ SOLN
40.0000 mg | Freq: Once | INTRAMUSCULAR | Status: AC
  Administered 2020-10-04: 40 mg via INTRAVENOUS
  Filled 2020-10-04: qty 4

## 2020-10-04 MED ORDER — PHENYLEPHRINE 40 MCG/ML (10ML) SYRINGE FOR IV PUSH (FOR BLOOD PRESSURE SUPPORT)
PREFILLED_SYRINGE | INTRAVENOUS | Status: AC
  Filled 2020-10-04: qty 10

## 2020-10-04 MED ORDER — POTASSIUM CHLORIDE 20 MEQ PO PACK
40.0000 meq | PACK | Freq: Once | ORAL | Status: AC
  Administered 2020-10-04: 40 meq
  Filled 2020-10-04: qty 2

## 2020-10-04 MED ORDER — PIVOT 1.5 CAL PO LIQD
1000.0000 mL | ORAL | Status: DC
  Administered 2020-10-04 – 2020-10-11 (×9): 1000 mL

## 2020-10-04 MED ORDER — EPINEPHRINE 1 MG/10ML IJ SOSY
PREFILLED_SYRINGE | INTRAMUSCULAR | Status: AC
  Filled 2020-10-04: qty 20

## 2020-10-04 MED ORDER — SODIUM BICARBONATE 8.4 % IV SOLN
INTRAVENOUS | Status: AC
  Filled 2020-10-04: qty 50

## 2020-10-04 MED ORDER — ALBUMIN HUMAN 5 % IV SOLN
INTRAVENOUS | Status: AC
  Filled 2020-10-04: qty 250

## 2020-10-04 MED ORDER — VANCOMYCIN HCL 1750 MG/350ML IV SOLN
1750.0000 mg | INTRAVENOUS | Status: DC
  Administered 2020-10-05 – 2020-10-08 (×5): 1750 mg via INTRAVENOUS
  Filled 2020-10-04 (×7): qty 350

## 2020-10-04 MED ORDER — TAMSULOSIN HCL 0.4 MG PO CAPS: 0.4000 mg | ORAL_CAPSULE | Freq: Every day | ORAL | Status: DC

## 2020-10-04 NOTE — Progress Notes (Signed)
Patient transported to CT 2 with bedside RN, RT, transporter and ECMO Specialists along with emergency drugs and blood. Cardiohelp console on battery power and removed from wall source O2 to tank at 6 LPM. Patient tolerated trip without incident and returned to 2H24. Cadriohelp console placed back on red outlet wall power and gas line switched from O2 tank to blender. Patients O2 remained above 95% with stable vital signs during trip.

## 2020-10-04 NOTE — Progress Notes (Signed)
Occupational Therapy Treatment Patient Details Name: Donald Rowe MRN: [id number] DOB: 01-29-1961 Today's Date: 10/04/2020    History of present illness 60 y.o. male with a pertinent history of hypothyroidism on levothyroxine and hyperlipidemia who was diagnosed with Covid 11/27 and is not vaccinated.  With EMS SaO2 on RA 78%O2. ED patient was increased to 6 L oxygen satting about 92%,. CTA of the Chest was assessed and negative for pulmonary embolism but showed diffuse bilateral infiltrates. He was treated with IV remdesivir, steroids and baricitinib.  Initially, he was also treated with antibiotics but these were stopped on 12/6 with a low procalcitonin. 12/07 intubated, central line, A line, 12/08 ECMO cannulation, cortrak, attempted extubation 12/9 failed reintubated. Trach placed 12/16. CCRT started 12/26.   OT comments  Pt with eyes open off and on.  Coughing noted throughout.  BP monitored throughout.   Stood pt in tilt bed at 35 degrees x 12 minutes with ECMO specialist/RN managing lines. Pt with stable VS throughout.  Spoke with spouse today.  Patient with old R shoulder injury.  Was able to perform shoulder forward flexion to about 95 degrees.    Follow Up Recommendations  CIR    Equipment Recommendations       Recommendations for Other Services      Precautions / Restrictions Precautions Precaution Comments: ECMO, cortrak, vent via trach, cath, CRRT via femoral catheter Restrictions Weight Bearing Restrictions: No       Mobility Bed Mobility               General bed mobility comments: Continued: Total A +2 for reposition in bed.  Transfers                 General transfer comment: used tilt bed to raise pt to 35 degrees in standing x 12 min with ECMO specialist/RN monitoring CRRT and ECMO lines.    Balance Overall balance assessment: Needs assistance Sitting-balance support: No upper extremity supported Sitting balance-Leahy Scale: Zero     Standing  balance support: No upper extremity supported;During functional activity Standing balance-Leahy Scale: Poor Standing balance comment: tilted to 35 degrees x 12 min, pt without truncal control                                                Cognition Arousal/Alertness: Lethargic   Overall Cognitive Status: Difficult to assess                                                         Frequency  Min 2X/week        Progress Toward Goals  OT Goals(current goals can now be found in the care plan section)     Acute Rehab OT Goals Patient Stated Goal: unable OT Goal Formulation: Patient unable to participate in goal setting Time For Goal Achievement: 10/11/20 Potential to Achieve Goals: Fair  Plan      Co-evaluation                 AM-PAC OT "6 Clicks" Daily Activity     Outcome Measure   Help from another person eating meals?: Total Help from another person taking care of personal grooming?: Total  Help from another person toileting, which includes using toliet, bedpan, or urinal?: Total Help from another person bathing (including washing, rinsing, drying)?: Total Help from another person to put on and taking off regular upper body clothing?: Total Help from another person to put on and taking off regular lower body clothing?: Total 6 Click Score: 6    End of Session    OT Visit Diagnosis: Unsteadiness on feet (R26.81);Other abnormalities of gait and mobility (R26.89);Muscle weakness (generalized) (M62.81);Pain   Activity Tolerance Treatment limited secondary to medical complications (Comment)   Patient Left in bed;with call bell/phone within reach;with nursing/sitter in room   Nurse Communication          Time: 1340-1400 OT Time Calculation (min): 20 min  Charges: OT General Charges $OT Visit: 1 Visit OT Treatments $Therapeutic Activity: 8-22 mins  10/04/2020  Rich, OTR/L  Acute Rehabilitation  Services  Office:  [phone]    Donald Rowe 10/04/2020, 2:51 PM

## 2020-10-04 NOTE — Procedures (Signed)
Cortrak  Person Inserting Tube:  Donald Rowe, RD Tube Type:  Cortrak - 43 inches Tube Location:  Right nare Initial Placement:  Postpyloric Secured by: Bridle Technique Used to Measure Tube Placement:  Documented cm marking at nare/ corner of mouth Cortrak Secured At:  100 cm    Cortrak Tube Team Note:  Consult received to place a Cortrak feeding tube.   X-ray is required, abdominal x-ray has been ordered by the Cortrak team. Please confirm tube placement before using the Cortrak tube.   If the tube becomes dislodged please keep the tube and contact the Cortrak team at [email or web address] ([email or web address]) for replacement.  If after hours and replacement cannot be delayed, place a NG tube and confirm placement with an abdominal x-ray.   Donald Pacini, MS, RD, LDN Pager number available on Amion

## 2020-10-04 NOTE — Progress Notes (Signed)
This chaplain is present for F/U spiritual care and prayer with the Pt. and his wife-Donald Rowe.  The chaplain listened as [name] talked about  family at the Pt. bedside.  The chaplain's invitation for F/U spiritual care was accepted.

## 2020-10-04 NOTE — Procedures (Signed)
Bedside Bronchoscopy Procedure Note Donald Rowe [id number] [birth year]  Procedure: Bronchoscopy Indications: Diagnostic evaluation of the airways and Obtain specimens for culture and/or other diagnostic studies  Procedure Details: ET Tube Size: ET Tube secured at lip (cm): Bite block in place: No In preparation for procedure, Patient hyper-oxygenated with 100 % FiO2 Airway entered and the following bronchi were examined: RUL, RML, RLL, LUL and LLL.   Bronchoscope removed.    Evaluation BP (!) 157/74   Pulse (!) 119   Temp 98.1 F (36.7 C) (Core)   Resp (!) 33   Ht 5\' 9"  (1.753 m)   Wt 99.3 kg   SpO2 97%   BMI 32.33 kg/m  Breath Sounds:Rhonch O2 sats: stable throughout Patient's Current Condition: stable Specimens:  Sent serosanguinous fluid Complications: No apparent complications Patient did tolerate procedure well.   10/04/2020, 4:09 PM

## 2020-10-04 NOTE — Progress Notes (Signed)
ANTICOAGULATION CONSULT NOTE  Pharmacy Consult for bivalirudin Indication: ECMO + bilateral DVTs + [place] 12/26   Recent Labs    10/03/20 1656 10/03/20 1658 10/04/20 0401 10/04/20 0403 10/04/20 1623  HGB 7.7*   < > 7.5* 7.8* 8.3*  HCT 23.2*   < > 22.0* 23.1* 27.1*  PLT 127*  --   --  144* 174  APTT 59*  --   --  56* 57*  CREATININE 1.21  --   --  0.99 1.16   < > = values in this interval not displayed.    Estimated Creatinine Clearance: 79.6 mL/min (by C-G formula based on SCr of 1.16 mg/dL).   Assessment: 53 yom unvaccinated presenting with severe COVID ARDs, intubated on 12/6, continued to have ventilation issues despite NMB and proning - started on VV ECMO on 12/8. Pt noted to have bilateral DVTs 12/8. Bivalirudin started for ECMO anticoagulation. Head CT 12/26 with new small SAH, repeat 12/27 stable without expansion. Repeat heat CT 1/5 SAH improved.   APTT remains at goal this evening.  Goal of Therapy:  aPTT 50-70 seconds  Monitor platelets by anticoagulation protocol: Yes   Plan:  Continue bivalirudin at 0.04 mg/kg/hr Recheck aPTTs every 5a & 5p with ECMO labs.  Fredonia Highland, PharmD, BCPS, [facility] Clinical Pharmacist [phone] Please check AMION for all [facility] numbers 10/04/2020

## 2020-10-04 NOTE — Procedures (Signed)
Bronchoscopy Procedure Note  Donald Rowe  [id number]  05/01/1961  Date:10/04/20  Time:5:16 PM   Provider Performing:Robbin Escher P [name]   Procedure(s):  Flexible bronchoscopy with bronchial alveolar lavage (07225)  Indication(s) Acute respiratory failure  Consent Risks of the procedure as well as the alternatives and risks of each were explained to the patient and/or caregiver.  Consent for the procedure was obtained and is signed in the bedside chart. Consent provided by wife Donald Rowe.  Anesthesia Per MAR   Time Out Verified patient identification, verified procedure, site/side was marked, verified correct patient position, special equipment/implants available, medications/allergies/relevant history reviewed, required imaging and test results available.   Sterile Technique Usual hand hygiene, masks, gowns, and gloves were used   Procedure Description Bronchoscope advanced through tracheostomy tube and into airway.  Airways were examined down to subsegmental level with findings noted below.   Following diagnostic evaluation, BAL(s) performed in LLL with normal saline and return of 20cc fluid  Findings: Purulent secretions in LLL; thick secretions in all lobes of the lung but only mildly purulent in LLL. Dynamic airway collapse mostly noted in RUL with complete occlusion distal to bronchoscope in RUL bronchus.   Complications/Tolerance None; patient tolerated the procedure well. Chest X-ray is not needed post procedure.   EBL 0   Specimen(s) BAL LLL  Donald Hy, DO 10/04/20 5:18 PM [name]

## 2020-10-04 NOTE — Progress Notes (Signed)
Assisted tele visit to patient with family member.  Thomas, Shyna Duignan Renee, RN   

## 2020-10-04 NOTE — Plan of Care (Signed)
  Problem: Respiratory: Goal: Will maintain a patent airway Outcome: Progressing Goal: Complications related to the disease process, condition or treatment will be avoided or minimized Outcome: Progressing   Problem: Fluid Volume: Goal: Hemodynamic stability will improve Outcome: Progressing   Problem: Clinical Measurements: Goal: Diagnostic test results will improve Outcome: Progressing Goal: Signs and symptoms of infection will decrease Outcome: Progressing   Problem: Clinical Measurements: Goal: Ability to maintain clinical measurements within normal limits will improve Outcome: Progressing Goal: Will remain free from infection Outcome: Progressing Goal: Diagnostic test results will improve Outcome: Progressing Goal: Respiratory complications will improve Outcome: Progressing Goal: Cardiovascular complication will be avoided Outcome: Progressing   

## 2020-10-04 NOTE — Progress Notes (Signed)
ANTICOAGULATION CONSULT NOTE  Pharmacy Consult for bivalirudin Indication: ECMO + bilateral DVTs + [facility] 12/26   Recent Labs    10/03/20 0320 10/03/20 0434 10/03/20 1133 10/03/20 1227 10/03/20 1532 10/03/20 1656 10/03/20 1658 10/03/20 2346 10/04/20 0401 10/04/20 0403  HGB 11.7*  7.8* 8.4*   < >  --    < > 7.7*   < > 7.5* 7.5* 7.8*  HCT 36.2*  23.0* 26.4*   < >  --    < > 23.2*   < > 22.0* 22.0* 23.1*  PLT 95* 133*  --   --   --  127*  --   --   --  144*  APTT 54*  --   --  57*  --  59*  --   --   --  56*  CREATININE 1.28*  --   --   --   --  1.21  --   --   --  0.99   < > = values in this interval not displayed.    Estimated Creatinine Clearance: 93.3 mL/min (by C-G formula based on SCr of 0.99 mg/dL).   Assessment: 36 yom unvaccinated presenting with severe COVID ARDs, intubated on 12/6, continued to have ventilation issues despite NMB and proning - started on VV ECMO on 12/8. Pt noted to have bilateral DVTs 12/8. Bivalirudin started for ECMO anticoagulation. Head CT 12/26 with new small SAH, repeat 12/27 stable without expansion. Repeat heat CT 1/5 SAH improved.   APTT this am is 56 seconds at goal on bivalirudin 0.04mg /kg/hr.   H/H down likely from recent hematuria after foley placed - improved today.  LDH stable 500s, fibrinogen stable 400, last PRBC 1/3   Goal of Therapy:  aPTT 50-70 seconds  Monitor platelets by anticoagulation protocol: Yes   Plan:  Continue bivalirudin at 0.04 mg/kg/hr Recheck aPTTs every 5a & 5p with ECMO labs.   Leota Sauers Pharm.D. CPP, BCPS Clinical Pharmacist [phone] 10/04/2020 3:32 PM    Please check AMION for all [facility] Pharmacy numbers 10/04/2020

## 2020-10-04 NOTE — Progress Notes (Signed)
Patient ID: Donald Rowe, male   DOB: [birth year], 60 y.o.   MRN: [id number]    Advanced Heart Failure Rounding Note   Subjective:    12/8 Cannulated for VV ECMO - 32 FR  RIJ Crescent 12/9 Extubated/Reintubated for altered mental status 12/13 Circuit change for elevated LDH 12/15 PLEX started 12/16 Tracheostomy 12/17 MRSA in sputum (negative cx 12/26) 12/23 head CT (normal) for change in mental status.  12/26 Pan CT for drop in hgb. Small SAH. Severe lung disease 12/27 CVVH begun 1/1 CVVH stopped  Remains sedated. Foley placed for urinary retention with ~ 2L out. BP still labile but somewhat improved. Having increased secretions.   CT head and chest today with resolution of SAH. Still with severe bilateral airspace disease, slightly improved. Pneumomediastinum has resolved.    ECMO   Speed 3200 Flow 4.58 L Sweep 5 dP 22 pVen -67  ABG: 7.32/51/91/96% Hgb 8.4 LDH 518 -> 488 -> 514 -> 498 -> 539 -> 542 Lactic acid 0.5 PTT 54  Vent 40%  TV ~400cc  Objective:   Weight Range:  Vital Signs:   Temp:  [97.9 F (36.6 C)-98.7 F (37.1 C)] 98.1 F (36.7 C) (01/05 0800) Pulse Rate:  [90-134] 119 (01/05 0800) Resp:  [13-43] 25 (01/05 1100) BP: (94-184)/(50-100) 155/100 (01/05 1100) SpO2:  [88 %-98 %] 96 % (01/05 1100) Arterial Line BP: (92-190)/(42-79) 174/67 (01/05 1100) FiO2 (%):  [40 %] 40 % (01/05 0728) Weight:  [99.3 kg] 99.3 kg (01/05 0500) Last BM Date: 10/03/19  Weight change: Filed Weights   10/02/20 0500 10/03/20 0500 10/04/20 0500  Weight: 100.6 kg 101.8 kg 99.3 kg    Intake/Output:   Intake/Output Summary (Last 24 hours) at 10/04/2020 1105 Last data filed at 10/04/2020 1100 Gross per 24 hour  Intake 3688.84 ml  Output 5450 ml  Net -1761.16 ml     Physical Exam: General:  Sedated on vent  HEENT: normal Neck: supple. + RIJ ecmo cannula. + trach with secretions Carotids 2+ bilat; no bruits. No lymphadenopathy or thryomegaly appreciated. Cor: PMI  nondisplaced. Regular tachy. No rubs, gallops or murmurs. Lungs: coarse Abdomen: soft, nontender, nondistended. No hepatosplenomegaly. No bruits or masses. Good bowel sounds. Extremities: no cyanosis, clubbing, rash, tr edema Neuro: sedated on vent. Responds to painful stimuli   Telemetry: sinus 100-120 Personally reviewed     Labs: Basic Metabolic Panel: Recent Labs  Lab 09/30/20 0404 09/30/20 0415 10/01/20 0508 10/01/20 0518 10/02/20 0401 10/02/20 0411 10/02/20 1600 10/02/20 1614 10/03/20 0320 10/03/20 1133 10/03/20 1656 10/03/20 1658 10/03/20 1708 10/03/20 1954 10/03/20 2346 10/04/20 0401 10/04/20 0403  NA 140   < > 140   < > 138   < > 136   < > 140  140   < > 138   < > 141 142 143 144 141  K 4.9   < > 4.6   < > 4.2   < > 3.8   < > 4.6  4.7   < > 4.1   < > 4.4 4.4 4.5 4.0 3.8  CL 106   < > 106   < > 104  --  100  --  106  --  107  --   --   --   --   --  111  CO2 24   < > 23   < > 24  --  25  --  26  --  25  --   --   --   --   --  24  GLUCOSE 73   < > 69*   < > 99  --  113*  --  111*  --  110*  --   --   --   --   --  102*  BUN 49*   < > 64*   < > 70*  --  71*  --  70*  --  62*  --   --   --   --   --  53*  CREATININE 0.92   < > 1.23   < > 1.34*  --  1.34*  --  1.28*  --  1.21  --   --   --   --   --  0.99  CALCIUM 8.2*   < > 9.0   < > 9.0  --  8.8*  --  8.9  --  8.8*  --   --   --   --   --  7.7*  MG 3.2*  --  3.0*  --  2.8*  --   --   --  2.5*  --   --   --   --   --   --   --  2.0  PHOS 3.2   < > 4.4   < > 6.4*  --  6.2*  --  5.5*  --  4.6  --   --   --   --   --  3.2   < > = values in this interval not displayed.    Liver Function Tests: Recent Labs  Lab 10/02/20 0401 10/02/20 1600 10/03/20 0320 10/03/20 1656 10/04/20 0403  AST 48*  --   --   --   --   ALT 46*  --   --   --   --   ALKPHOS 141*  --   --   --   --   BILITOT 1.1  --   --   --   --   PROT 5.9*  --   --   --   --   ALBUMIN 3.0*  3.0* 2.9* 2.7* 2.6* 2.3*   No results for input(s):  LIPASE, AMYLASE in the last 168 hours. No results for input(s): AMMONIA in the last 168 hours.  CBC: Recent Labs  Lab 09/28/20 2000 09/28/20 2014 10/02/20 1613 10/02/20 1614 10/03/20 0320 10/03/20 0434 10/03/20 1133 10/03/20 1656 10/03/20 1658 10/03/20 1708 10/03/20 1954 10/03/20 2346 10/04/20 0401 10/04/20 0403  WBC 9.8   < > 8.8  --  5.1 8.2  --  6.5  --   --   --   --   --  7.5  NEUTROABS 6.0  --   --   --   --   --   --   --   --   --   --   --   --   --   HGB 9.1*   < > 8.4*   < > 11.7*  7.8* 8.4*   < > 7.7*   < > 8.2* 8.2* 7.5* 7.5* 7.8*  HCT 28.0*   < > 25.8*   < > 36.2*  23.0* 26.4*   < > 23.2*   < > 24.0* 24.0* 22.0* 22.0* 23.1*  MCV 94.6   < > 95.2  --  95.5 96.7  --  95.5  --   --   --   --   --  96.3  PLT 104*   < > 113*  --  95* 133*  --  127*  --   --   --   --   --  144*   < > = values in this interval not displayed.    Cardiac Enzymes: No results for input(s): CKTOTAL, CKMB, CKMBINDEX, TROPONINI in the last 168 hours.  BNP: BNP (last 3 results) No results for input(s): BNP in the last 8760 hours.  ProBNP (last 3 results) No results for input(s): PROBNP in the last 8760 hours.    Other results:  Imaging: CT HEAD WO CONTRAST  Result Date: 10/04/2020 CLINICAL DATA:  Subarachnoid hemorrhage. EXAM: CT HEAD WITHOUT CONTRAST TECHNIQUE: Contiguous axial images were obtained from the base of the skull through the vertex without intravenous contrast. COMPARISON:  September 25, 2020. FINDINGS: Brain: No evidence of acute infarction, hemorrhage, hydrocephalus, extra-axial collection or mass lesion/mass effect. Subarachnoid hemorrhage noted on prior exam is not visualized currently. Vascular: No hyperdense vessel or unexpected calcification. Skull: Normal. Negative for fracture or focal lesion. Sinuses/Orbits: No acute finding. Other: Fluid is noted in mastoid air cells bilaterally. IMPRESSION: Subarachnoid hemorrhage noted on prior exam is not visualized currently.  No acute intracranial abnormality seen. Electronically Signed   By: Marijo Conception M.D.   On: 10/04/2020 10:03   CT CHEST WO CONTRAST  Result Date: 10/04/2020 CLINICAL DATA:  History of COVID-19 positivity with respiratory distress EXAM: CT CHEST WITHOUT CONTRAST TECHNIQUE: Multidetector CT imaging of the chest was performed following the standard protocol without IV contrast. COMPARISON:  CT from 09/24/2020, chest x-ray from earlier in the same day. FINDINGS: Cardiovascular: Thoracic aorta is well visualize without aneurysmal dilatation. No significant cardiac enlargement is noted. Relative decreased attenuation of the cardiac blood pool is noted suggestive of underlying anemia. No dilatation of the pulmonary artery is seen. ECMO cannula is noted on the right extending into the IVC. Coronary calcifications are noted. Left-sided PICC line is noted in place. Mediastinum/Nodes: Thoracic inlet is within normal limits. Tracheostomy tube is noted in place. Gastric catheter extends into the stomach. No sizable hilar or mediastinal adenopathy is noted. The esophagus as visualized is within normal limits. Previously seen pneumomediastinum has resolved in the interval. Lungs/Pleura: Diffuse bilateral airspace opacity is again identified but slightly improved when compared with the prior exam. No sizable effusions are noted. Upper Abdomen: Visualized upper abdomen shows no acute abnormality. Musculoskeletal: No chest wall mass or suspicious bone lesions identified. IMPRESSION: Diffuse airspace opacities and edema consistent with the known history of ARDS and COVID-19 pneumonia. Improved aeration is noted when compared with the prior CT. Resolution of previously seen pneumomediastinum. Tubes and lines as described above. Electronically Signed   By: Inez Catalina M.D.   On: 10/04/2020 10:10   DG CHEST PORT 1 VIEW  Result Date: 10/04/2020 CLINICAL DATA:  Respiratory failure, COVID pneumonia, ECMO EXAM: PORTABLE CHEST 1  VIEW COMPARISON:  10/03/2020 FINDINGS: Tracheostomy, nasogastric tube extending into the upper abdomen beyond the margin of the examination, and left upper extremity PICC line with its tip within the superior vena cava are unchanged. ECMO cannula has been slightly withdrawn with its proximal marker overlying the superior vena cava, its middle marker over the expected superior cavoatrial junction, and its distal marker likely within the a inferior right atrium.l Pulmonary insufflation is preserved. Lung volumes are small. Extensive diffuse airspace infiltrate appears stable with numerous air bronchograms noted within the lung bases bilaterally. No pneumothorax or pleural effusion. IMPRESSION: ECMO catheter slightly withdrawn. Advancement of approximately 3-4 cm may better position  the middle cannula within the right atrium. Otherwise stable support lines and tubes. Stable pulmonary insufflation.  Lung volumes are small. Stable diffuse extensive airspace infiltrate, likely infectious. Electronically Signed   By: Fidela Salisbury MD   On: 10/04/2020 06:43   DG CHEST PORT 1 VIEW  Result Date: 10/03/2020 CLINICAL DATA:  Tracheostomy.  ECMO.  Recent COVID. EXAM: PORTABLE CHEST 1 VIEW COMPARISON:  10/02/2020. FINDINGS: Tracheostomy tube, NG tube, left PICC line, ECMO device in stable position. Heart size stable. Low lung volumes. Diffuse severe bilateral pulmonary infiltrates again noted without interim change. No pleural effusion or pneumothorax. IMPRESSION: 1. Lines and tubes in stable position. 2. Low lung volumes. Diffuse severe bilateral pulmonary infiltrates again noted without interim change. Electronically Signed   By: Marcello Moores  Register   On: 10/03/2020 06:05     Medications:     Scheduled Medications: . amLODipine  10 mg Per Tube QHS  . chlorhexidine gluconate (MEDLINE KIT)  15 mL Mouth Rinse BID  . Chlorhexidine Gluconate Cloth  6 each Topical Daily  . clonazePAM  2 mg Per Tube Q8H  . cloNIDine  0.1  mg Per Tube TID  . docusate  100 mg Per Tube BID  . feeding supplement (PROSource TF)  45 mL Per Tube QID  . fiber  1 packet Per Tube BID  . insulin aspart  2-6 Units Subcutaneous Q4H  . levothyroxine  50 mcg Per Tube Q0600  . mouth rinse  15 mL Mouth Rinse 10 times per day  . metoCLOPramide (REGLAN) injection  10 mg Intravenous Q6H  . metoprolol tartrate  50 mg Per Tube Q12H  . nystatin  5 mL Per Tube QID  . oxyCODONE  15 mg Per Tube Q6H  . pantoprazole sodium  40 mg Per Tube BID  . polyethylene glycol  17 g Per Tube BID  . pravastatin  40 mg Per Tube q1800  . QUEtiapine  150 mg Per Tube TID  . sodium chloride flush  10-40 mL Intracatheter Q12H    Infusions: . sodium chloride 250 mL (09/30/20 1447)  . albumin human 12.5 g (09/29/20 1644)  . bivalirudin (ANGIOMAX) infusion 0.5 mg/mL (Non-ACS indications) 0.04 mg/kg/hr (10/04/20 1100)  . dexmedetomidine (PRECEDEX) IV infusion 0.4 mcg/kg/hr (10/04/20 1100)  . feeding supplement (PIVOT 1.5 CAL) Stopped (10/04/20 0820)  . fentaNYL infusion INTRAVENOUS 100 mcg/hr (10/04/20 1100)  . niCARDipine Stopped (09/30/20 0833)    PRN Medications: acetaminophen (TYLENOL) oral liquid 160 mg/5 mL, albumin human, albuterol, diphenhydrAMINE, fentaNYL, Gerhardt's butt cream, guaiFENesin-dextromethorphan, haloperidol lactate, labetalol, lip balm, LORazepam, midazolam, ondansetron **OR** ondansetron (ZOFRAN) IV, phenylephrine, sodium chloride flush   Assessment/Plan:   1. Acute hypoxic respiratory failure/ARDS in setting of COVID-19 PNA - Cannulated for VV ECMO on 12/8 after failing intubation x 2 days - has complete remedisivir, baricitinib, Solumedrol.  - s/p trach 12/16 - CT chest 12/26 with severe lung disease and pneumomediastinum.  - 12/17 MRSA in sputum (negative cx 12/26) - Restarted vanc/cefepime 12/26 => now completed course.  - Circuit changed 12/13 for elevated LDH and lower flows. PLEX started 12/15. S/p PLEX #4/4 on 12/21. LDH has  been stable but pending today. Circuit parameters stable.   - CVVH started 12/27 with intractable volume overload, stopped 1/1.  Now respoding well to IV lasix. Weight down. Creatinine stable.Dose lasix as needed - Bivalirudin with PTT goal back up to 50-70. PTT 56 today -  CT head and chest today (10/04/20) with resolution of SAH. Still with severe bilateral  airspace disease, slightly improved. Pneumomediastinum has resolved.  - CT findings a bit reassuring but still with a long way to go. Continue to wean sweep and try to get sedation down   2. Bilateral DVT - remains on bivalirudin - Discussed dosing with PharmD personally.  3. DM2 - Insulin adjusted. CBGs improved   4. F/E/N - Had ileus over weekend. Reglan back on. Now tolerating trickle feeds.   6. HTN - Remains very labile  - Meds adjusted  7. Rectal bleeding - much improved  8. AKI - Now off CVVH, Creatinine 0.99 today  9. Acute encephalopathy - will respond to pain and occasionally follow simple commands. Full assessment limited by severe agitation and need for sedation to protect circuit.  - head CT ok 09/07/20 and 12/21 - head CT 12/26. Small SAH.  Repeat CT on 12/27 with unchanged small SAH.  - head CT 10/04/20 SAH resolved  10. Small SAH - on CT 12/26 - repeat CT 12/27 unchanged small SAH.  - PTT goal reduced 40-50 for several days, plan to go back to 50-70 today.  - avoid severe HTN as best as possible - head CT 10/04/20 SAH resolved  11. Gastric ileus - slighly improved with Reglan. Tolarating trickle feeds.   12. Urinary retention  - Foley placed 10/03/20 - add flomax  13. Anemia - Transfuse hgb < 8.  - Has been getting periodic blood without overt GI bleeding. No change  Plan discussed on multi-discplinary ECMO rounds with CCM, ECMO coordinator/specialist, PharmDs and RNs.  CRITICAL CARE Performed by: Glori Bickers  Total critical care time: 40 minutes  Critical care time was exclusive of  separately billable procedures and treating other patients.  Critical care was necessary to treat or prevent imminent or life-threatening deterioration.  Critical care was time spent personally by me (independent of midlevel providers or residents) on the following activities: development of treatment plan with patient and/or surrogate as well as nursing, discussions with consultants, evaluation of patient's response to treatment, examination of patient, obtaining history from patient or surrogate, ordering and performing treatments and interventions, ordering and review of laboratory studies, ordering and review of radiographic studies, pulse oximetry and re-evaluation of patient's condition.    Length of Stay: 40   Glori Bickers MD 10/04/2020, 11:05 AM  Advanced Heart Failure Team Pager [phone] (M-F; [place])  Please contact [facility] for night-coverage after hours (4p -7a ) and weekends on [email or web address]

## 2020-10-04 NOTE — Progress Notes (Signed)
Pharmacy Antibiotic Note  Donald Rowe is a 60 y.o. male admitted on 09/21/2020 with COVID PNA requiring ECMO cannulation. Pt has been on several ABX courses, pharmacy now asked to restart vancomycin and cefepime after bronch today.  Plan: Cefepime 2g IV q8h Vancomycin 1750mg  q24h - est AUC 502 Follow LOT, Cx, renal function Vancomycin levels as needed   Height: 5\' 9"  (175.3 cm) Weight: 99.3 kg (218 lb 14.7 oz) IBW/kg (Calculated) : 70.7  Temp (24hrs), Avg:98.2 F (36.8 C), Min:97.9 F (36.6 C), Max:98.7 F (37.1 C)  Recent Labs  Lab 10/02/20 0401 10/02/20 1529 10/02/20 1600 10/02/20 1613 10/03/20 0320 10/03/20 0434 10/03/20 1655 10/03/20 1656 10/04/20 0403 10/04/20 1623  WBC 11.2*   < >  --  8.8 5.1 8.2  --  6.5 7.5 10.3  CREATININE 1.34*  --  1.34*  --  1.28*  --   --  1.21 0.99  --   LATICACIDVEN 0.6  --   --  0.4* 0.5  --  0.4*  --  0.5 0.6   < > = values in this interval not displayed.    Estimated Creatinine Clearance: 93.3 mL/min (by C-G formula based on SCr of 0.99 mg/dL).    No Known Allergies  Antimicrobials this admission: 12/5 Ceftriaxone >> 12/6 12/5 Azith >> 12/6 12/7 Cefepime>> 12/14, 12/17>> 12/17; 12/26 >>1/1; 1/5 >> 12/14 Vancomycin>>12/20; 12/26 >>1/1; 1/5 >> 12/17 Meropenem >>12/21  1/18, PharmD, BCPS, [facility] Clinical Pharmacist [phone] Please check AMION for all [facility] Pharmacy numbers 10/04/2020

## 2020-10-04 NOTE — Progress Notes (Signed)
RT assisted transporting patient to CT and returned to 2H24. No complications. Vitals stable throughout. RT will continue to monitor.

## 2020-10-04 NOTE — Procedures (Signed)
Extracorporeal support note     ECLS cannulation: 09/04/2020 Last circuit change: 09/11/2020  Indication: COVID ARDS   Configuration: VV, RIJ 32Fr [place]   Pump speed: 3200 Pump flow: 4.2 L Pump used: Cardiohelp   Sweep gas: 100%, 5.5 LPM   Circuit check: Clot only in corners of oxygenator- no change since yesterday Anticoagulant: bivalirudin   Changes in support: Con't aggressive care. Bronch today- restarting antibiotics. PTT goal 50-70. Increasing oral meds, need to titrate off IV infusions as able. Repeat Ct chest and head this morning- both improved from last week.  anticipated goals/duration of support: Bridge to recovery, day 28 of support.  Steffanie Dunn, DO 10/04/20 5:21 PM [facility]

## 2020-10-04 NOTE — Progress Notes (Signed)
NAME:  Donald Rowe, MRN:  [id number], DOB:  1961/06/28, LOS: 47 ADMISSION DATE:  09/05/2020, CONSULTATION DATE:  12/6 REFERRING MD:  Dr. Aileen Fass, CHIEF COMPLAINT:  SOB    Brief History   60 y/o M admitted 12/4 with 1 week hx of SOB, known COVID positive.  He is unvaccinated.  CTA chest negative for PE but demonstrated diffuse bilateral infiltrates. [place] started on 12/8   Past Medical History  Hypothyroidism  [place] [facility] Events/Procedures  12/04 Admit  12/06 PCCM consulted  12/07 Intubated, central line, a line 12/08 VV ECMO Cannulaton, cortrak 12/13 circuit change for hemolytic anemia 12/16 Tracheostomy  12/17 bronch w/ BAL  Consults:  PCCM, Heart failure, TCTS, ECMO  Significant Diagnostic Tests:   CTA Chest 12/4 >> extensive bilateral airspace disease, no large PE identified, limited study   LE Venous Duplex 12/4 >> negative for DVT bilaterally   LE venous duplex 12/8> BLE DVTs  RUE venous duplex 12/23  RUE arterial duplex 12/23  CT head 12/26 : Minimal SAH on left side  CT abdomen and pelvis 12/26: Extensive bilateral airspace disease with small pleural effusion  CT head 12/27: Subarachnoid unchanged  Chest x-ray 1/1: Significant improvement in airspace disease  Micro Data:  COVID 12/4 >> negative  Influenza A/B 12/4 >> negative  MRSA PCR 12/5 >> negative  BCx2 12/4 >> NG Trach aspirate 12/12> rare MRSA BAL 12/17> MRSA, candida Aspergillus Ag BAL 12/17> 0.05 12/18 sars2: positive  Antimicrobials:  Azithromycin 12/5 >> 12/6  Ceftriaxone 12/5 >> 12/6  Cefepime 12/7>12/14 vanc 12/14>12/20 Meropenem 12/17>12/21  Interim history/subjective:  Still on sedation this morning- waiting until back from CT. Tolerating TF increased to goal.  Objective   Blood pressure (!) 143/63, pulse (!) 101, temperature 97.9 F (36.6 C), temperature source Oral, resp. rate 20, height _0  (1.753 m), weight 99.3 kg, SpO2 97 %.    Vent Mode: PCV FiO2  (%):  [40 %] 40 % Set Rate:  [12 bmp] 12 bmp PEEP:  [10 cmH20] 10 cmH20 Pressure Support:  [10 cmH20] 10 cmH20 Plateau Pressure:  [16 cmH20-18 cmH20] 16 cmH20   Intake/Output Summary (Last 24 hours) at 10/04/2020 0717 Last data filed at 10/04/2020 0650 Gross per 24 hour  Intake 3761.73 ml  Output 5500 ml  Net -1738.27 ml   Filed Weights   10/02/20 0500 10/03/20 0500 10/04/20 0500  Weight: 100.6 kg 101.8 kg 99.3 kg    Examination: Gen: critically ill appearing man laying in bed in NAD, on MV and ECMO HEENT: [place]/AT, eyes anicteric, cortrak Neck: supple, trach in place without bleeding or secretions. RIJ ECMO cannula. CV: RRR, no murmurs Pulm: no wheezing or rhonchi, min thick secretions from trach tube Abd: soft, NT Extm: slowly improving dependent edema, no cyanosis or clubbing Skin: warm,no cyanosis or rashes Neuro: RASS -5 for procedures. PERRL. Strong cough reflex, especially with any repositioning.  CXR personally reviewed> less aerated lungs especially on the left.   Resolved Hospital Problem list     Assessment & Plan:   Acute hypoxemic and hypercarbic respiratory failure secondary to COVID PNA with severe ARDS. S/p VV ECMO cannulation 09/11/2020.  S/p tracheostomy 40/98 complicated by trach site bleeding improved Trivial left-sided subarachnoid hemorrhage Community-acquired pneumonia with MRSA- completed treatment. -Completed remdesivir, solumedrol, and baricitnib. -Con't full ECMO support, weaning sweep as tolerated. -Completed for treatment with 4 sessions of Plex for hemolysis -Furosemide daily for goal euvolemia  -Completed 7 days vanc for MRSA pneumonia. Bronch  to reculture, adding back antibiotics for HAP while culture pending. -Repeat CT chest to evaluate for signs of architectural change to suggest fibrosis, monitor pneumomediastinum>> no signs to suggest permanent fibrotic changes at this point that would point towards in ability to wean from ECMO at this  point.  Agitated delirium requiring titration of IV sedation.  Periods of severe agitation are limiting ability to wean ECMO -wean off fentanyl and precedex infusions> off after bronch -Continue enteral seroquel, clonazepam, and enteral oxycodone.  Bilateral lower ext DVTs- provoked by COVID infection -Continue bivalirudin; goal PTT 50-70 -needs 3 months AC for provoked DVT   Steroid-induced hyperglycemia- controlled  -SSI PRN -goal BG 140-180  Hypothyroidism -con't PTA synthroid   Labile hypertension, contributed by periods of agitation.  Hypotension after meds. -space out sedating meds -con't clonidine, hydralazine, and metoprolol -trying to moderate agitation with increased oral agents and IV PRNs to improve BP and agitation control.  Hemolytic anemia - improved. More expected down trending of Hb with expected losses associated with ECMO Throbocytopenia Hyperbilirubinemia Rheumatologic-associated MAHA remains in differential.  hgb and plt are stable -Completed treatment with the Plex for 4 sessions -Transfuse if hgb <8  Vomiting- resolved -con't reglan; can wean off as long as tolerating TF tomorrow -TF at goal  [facility]- suspect hypertensive in etiology> resolved on follow up imaging -adjusted PTT goals 50-70  Best practice (evaluated daily)  Diet: TF- uptitrating since vomiting Pain/Anxiety/Delirium protocol (if indicated): PAD protocol, limit IV meds VAP protocol (if indicated): yes DVT prophylaxis: Bivalirudin GI prophylaxis: pantoprazole  BID Glucose control: SSI Mobility: As tolerated Last date of multidisciplinary goals of care discussion: wife updated at bedside 1/5 Summary of discussion con't aggressive care Follow up goals of care discussion due:  Code Status: Full Disposition: ICU  This patient is critically ill with multiple organ system failure which requires frequent high complexity decision making, assessment, support, evaluation, and titration of  therapies. This was completed through the application of advanced monitoring technologies and extensive interpretation of multiple databases. During this encounter critical care time was devoted to patient care services described in this note for 50 minutes. Multidisciplinary ECMO rounds completed.   [name], DO 10/04/20 5:37 PM  Pulmonary & Critical Care

## 2020-10-05 ENCOUNTER — Inpatient Hospital Stay (HOSPITAL_COMMUNITY): Payer: HRSA Program

## 2020-10-05 DIAGNOSIS — J80 Acute respiratory distress syndrome: Secondary | ICD-10-CM | POA: Diagnosis not present

## 2020-10-05 DIAGNOSIS — Z9281 Personal history of extracorporeal membrane oxygenation (ECMO): Secondary | ICD-10-CM | POA: Diagnosis not present

## 2020-10-05 DIAGNOSIS — G9340 Encephalopathy, unspecified: Secondary | ICD-10-CM | POA: Diagnosis not present

## 2020-10-05 DIAGNOSIS — Z515 Encounter for palliative care: Secondary | ICD-10-CM | POA: Diagnosis not present

## 2020-10-05 DIAGNOSIS — U071 COVID-19: Secondary | ICD-10-CM | POA: Diagnosis not present

## 2020-10-05 DIAGNOSIS — Z9911 Dependence on respirator [ventilator] status: Secondary | ICD-10-CM | POA: Diagnosis not present

## 2020-10-05 DIAGNOSIS — J9601 Acute respiratory failure with hypoxia: Secondary | ICD-10-CM | POA: Diagnosis not present

## 2020-10-05 LAB — POCT I-STAT 7, (LYTES, BLD GAS, ICA,H+H)
Acid-Base Excess: 4 mmol/L — ABNORMAL HIGH (ref 0.0–2.0)
Acid-Base Excess: 4 mmol/L — ABNORMAL HIGH (ref 0.0–2.0)
Acid-Base Excess: 5 mmol/L — ABNORMAL HIGH (ref 0.0–2.0)
Acid-Base Excess: 6 mmol/L — ABNORMAL HIGH (ref 0.0–2.0)
Acid-Base Excess: 6 mmol/L — ABNORMAL HIGH (ref 0.0–2.0)
Acid-Base Excess: 6 mmol/L — ABNORMAL HIGH (ref 0.0–2.0)
Acid-Base Excess: 7 mmol/L — ABNORMAL HIGH (ref 0.0–2.0)
Bicarbonate: 30.8 mmol/L — ABNORMAL HIGH (ref 20.0–28.0)
Bicarbonate: 30.9 mmol/L — ABNORMAL HIGH (ref 20.0–28.0)
Bicarbonate: 32.1 mmol/L — ABNORMAL HIGH (ref 20.0–28.0)
Bicarbonate: 32.1 mmol/L — ABNORMAL HIGH (ref 20.0–28.0)
Bicarbonate: 32.4 mmol/L — ABNORMAL HIGH (ref 20.0–28.0)
Bicarbonate: 33.4 mmol/L — ABNORMAL HIGH (ref 20.0–28.0)
Bicarbonate: 33.5 mmol/L — ABNORMAL HIGH (ref 20.0–28.0)
Calcium, Ion: 1.32 mmol/L (ref 1.15–1.40)
Calcium, Ion: 1.32 mmol/L (ref 1.15–1.40)
Calcium, Ion: 1.33 mmol/L (ref 1.15–1.40)
Calcium, Ion: 1.33 mmol/L (ref 1.15–1.40)
Calcium, Ion: 1.34 mmol/L (ref 1.15–1.40)
Calcium, Ion: 1.35 mmol/L (ref 1.15–1.40)
Calcium, Ion: 1.35 mmol/L (ref 1.15–1.40)
HCT: 22 % — ABNORMAL LOW (ref 39.0–52.0)
HCT: 22 % — ABNORMAL LOW (ref 39.0–52.0)
HCT: 23 % — ABNORMAL LOW (ref 39.0–52.0)
HCT: 23 % — ABNORMAL LOW (ref 39.0–52.0)
HCT: 25 % — ABNORMAL LOW (ref 39.0–52.0)
HCT: 26 % — ABNORMAL LOW (ref 39.0–52.0)
HCT: 27 % — ABNORMAL LOW (ref 39.0–52.0)
Hemoglobin: 7.5 g/dL — ABNORMAL LOW (ref 13.0–17.0)
Hemoglobin: 7.5 g/dL — ABNORMAL LOW (ref 13.0–17.0)
Hemoglobin: 7.8 g/dL — ABNORMAL LOW (ref 13.0–17.0)
Hemoglobin: 7.8 g/dL — ABNORMAL LOW (ref 13.0–17.0)
Hemoglobin: 8.5 g/dL — ABNORMAL LOW (ref 13.0–17.0)
Hemoglobin: 8.8 g/dL — ABNORMAL LOW (ref 13.0–17.0)
Hemoglobin: 9.2 g/dL — ABNORMAL LOW (ref 13.0–17.0)
O2 Saturation: 92 %
O2 Saturation: 93 %
O2 Saturation: 94 %
O2 Saturation: 94 %
O2 Saturation: 95 %
O2 Saturation: 95 %
O2 Saturation: 96 %
Patient temperature: 36.8
Patient temperature: 36.8
Patient temperature: 36.8
Patient temperature: 36.8
Patient temperature: 37
Patient temperature: 37
Patient temperature: 37.2
Potassium: 3.8 mmol/L (ref 3.5–5.1)
Potassium: 3.9 mmol/L (ref 3.5–5.1)
Potassium: 3.9 mmol/L (ref 3.5–5.1)
Potassium: 4 mmol/L (ref 3.5–5.1)
Potassium: 4 mmol/L (ref 3.5–5.1)
Potassium: 4.3 mmol/L (ref 3.5–5.1)
Potassium: 4.5 mmol/L (ref 3.5–5.1)
Sodium: 147 mmol/L — ABNORMAL HIGH (ref 135–145)
Sodium: 148 mmol/L — ABNORMAL HIGH (ref 135–145)
Sodium: 148 mmol/L — ABNORMAL HIGH (ref 135–145)
Sodium: 149 mmol/L — ABNORMAL HIGH (ref 135–145)
Sodium: 149 mmol/L — ABNORMAL HIGH (ref 135–145)
Sodium: 149 mmol/L — ABNORMAL HIGH (ref 135–145)
Sodium: 149 mmol/L — ABNORMAL HIGH (ref 135–145)
TCO2: 33 mmol/L — ABNORMAL HIGH (ref 22–32)
TCO2: 33 mmol/L — ABNORMAL HIGH (ref 22–32)
TCO2: 34 mmol/L — ABNORMAL HIGH (ref 22–32)
TCO2: 34 mmol/L — ABNORMAL HIGH (ref 22–32)
TCO2: 34 mmol/L — ABNORMAL HIGH (ref 22–32)
TCO2: 35 mmol/L — ABNORMAL HIGH (ref 22–32)
TCO2: 35 mmol/L — ABNORMAL HIGH (ref 22–32)
pCO2 arterial: 50.2 mmHg — ABNORMAL HIGH (ref 32.0–48.0)
pCO2 arterial: 52.6 mmHg — ABNORMAL HIGH (ref 32.0–48.0)
pCO2 arterial: 57.6 mmHg — ABNORMAL HIGH (ref 32.0–48.0)
pCO2 arterial: 60.2 mmHg — ABNORMAL HIGH (ref 32.0–48.0)
pCO2 arterial: 60.7 mmHg — ABNORMAL HIGH (ref 32.0–48.0)
pCO2 arterial: 61.6 mmHg — ABNORMAL HIGH (ref 32.0–48.0)
pCO2 arterial: 65 mmHg — ABNORMAL HIGH (ref 32.0–48.0)
pH, Arterial: 7.313 — ABNORMAL LOW (ref 7.350–7.450)
pH, Arterial: 7.319 — ABNORMAL LOW (ref 7.350–7.450)
pH, Arterial: 7.336 — ABNORMAL LOW (ref 7.350–7.450)
pH, Arterial: 7.336 — ABNORMAL LOW (ref 7.350–7.450)
pH, Arterial: 7.341 — ABNORMAL LOW (ref 7.350–7.450)
pH, Arterial: 7.393 (ref 7.350–7.450)
pH, Arterial: 7.418 (ref 7.350–7.450)
pO2, Arterial: 70 mmHg — ABNORMAL LOW (ref 83.0–108.0)
pO2, Arterial: 71 mmHg — ABNORMAL LOW (ref 83.0–108.0)
pO2, Arterial: 78 mmHg — ABNORMAL LOW (ref 83.0–108.0)
pO2, Arterial: 79 mmHg — ABNORMAL LOW (ref 83.0–108.0)
pO2, Arterial: 80 mmHg — ABNORMAL LOW (ref 83.0–108.0)
pO2, Arterial: 81 mmHg — ABNORMAL LOW (ref 83.0–108.0)
pO2, Arterial: 81 mmHg — ABNORMAL LOW (ref 83.0–108.0)

## 2020-10-05 LAB — GLUCOSE, CAPILLARY
Glucose-Capillary: 126 mg/dL — ABNORMAL HIGH (ref 70–99)
Glucose-Capillary: 131 mg/dL — ABNORMAL HIGH (ref 70–99)
Glucose-Capillary: 143 mg/dL — ABNORMAL HIGH (ref 70–99)
Glucose-Capillary: 144 mg/dL — ABNORMAL HIGH (ref 70–99)
Glucose-Capillary: 146 mg/dL — ABNORMAL HIGH (ref 70–99)
Glucose-Capillary: 149 mg/dL — ABNORMAL HIGH (ref 70–99)
Glucose-Capillary: 157 mg/dL — ABNORMAL HIGH (ref 70–99)

## 2020-10-05 LAB — LACTATE DEHYDROGENASE: LDH: 526 U/L — ABNORMAL HIGH (ref 98–192)

## 2020-10-05 LAB — PREPARE RBC (CROSSMATCH)

## 2020-10-05 LAB — RENAL FUNCTION PANEL
Albumin: 2.6 g/dL — ABNORMAL LOW (ref 3.5–5.0)
Albumin: 2.6 g/dL — ABNORMAL LOW (ref 3.5–5.0)
Anion gap: 7 (ref 5–15)
Anion gap: 9 (ref 5–15)
BUN: 63 mg/dL — ABNORMAL HIGH (ref 6–20)
BUN: 60 mg/dL — ABNORMAL HIGH (ref 6–20)
CO2: 29 mmol/L (ref 22–32)
CO2: 30 mmol/L (ref 22–32)
Calcium: 8.9 mg/dL (ref 8.9–10.3)
Calcium: 9 mg/dL (ref 8.9–10.3)
Chloride: 108 mmol/L (ref 98–111)
Chloride: 109 mmol/L (ref 98–111)
Creatinine, Ser: 1.14 mg/dL (ref 0.61–1.24)
Creatinine, Ser: 1.11 mg/dL (ref 0.61–1.24)
GFR, Estimated: >60 mL/min (ref >60)
GFR, Estimated: >60 mL/min (ref >60)
Glucose, Bld: 133 mg/dL — ABNORMAL HIGH (ref 70–99)
Glucose, Bld: 150 mg/dL — ABNORMAL HIGH (ref 70–99)
Phosphorus: 2.8 mg/dL (ref 2.5–4.6)
Phosphorus: 3.7 mg/dL (ref 2.5–4.6)
Potassium: 4.8 mmol/L (ref 3.5–5.1)
Potassium: 4 mmol/L (ref 3.5–5.1)
Sodium: 144 mmol/L (ref 135–145)
Sodium: 148 mmol/L — ABNORMAL HIGH (ref 135–145)

## 2020-10-05 LAB — CBC
HCT: 23.9 % — ABNORMAL LOW (ref 39.0–52.0)
HCT: 28.7 % — ABNORMAL LOW (ref 39.0–52.0)
Hemoglobin: 7.8 g/dL — ABNORMAL LOW (ref 13.0–17.0)
Hemoglobin: 9 g/dL — ABNORMAL LOW (ref 13.0–17.0)
MCH: 31.6 pg (ref 26.0–34.0)
MCH: 30.7 pg (ref 26.0–34.0)
MCHC: 32.6 g/dL (ref 30.0–36.0)
MCHC: 31.4 g/dL (ref 30.0–36.0)
MCV: 96.8 fL (ref 80.0–100.0)
MCV: 98 fL (ref 80.0–100.0)
Platelets: 176 10*3/uL (ref 150–400)
Platelets: 195 10*3/uL (ref 150–400)
RBC: 2.47 MIL/uL — ABNORMAL LOW (ref 4.22–5.81)
RBC: 2.93 MIL/uL — ABNORMAL LOW (ref 4.22–5.81)
RDW: 16.1 % — ABNORMAL HIGH (ref 11.5–15.5)
RDW: 16.4 % — ABNORMAL HIGH (ref 11.5–15.5)
WBC: 7.4 10*3/uL (ref 4.0–10.5)
WBC: 7.8 10*3/uL (ref 4.0–10.5)
nRBC: 0 % (ref 0.0–0.2)
nRBC: 0 % (ref 0.0–0.2)

## 2020-10-05 LAB — APTT
aPTT: 52 seconds — ABNORMAL HIGH (ref 24–36)
aPTT: 56 seconds — ABNORMAL HIGH (ref 24–36)

## 2020-10-05 LAB — MRSA PCR SCREENING: MRSA by PCR: POSITIVE — AB

## 2020-10-05 LAB — LACTIC ACID, PLASMA
Lactic Acid, Venous: 0.6 mmol/L (ref 0.5–1.9)
Lactic Acid, Venous: 0.7 mmol/L (ref 0.5–1.9)

## 2020-10-05 LAB — FIBRINOGEN: Fibrinogen: 344 mg/dL (ref 210–475)

## 2020-10-05 LAB — MAGNESIUM: Magnesium: 2.2 mg/dL (ref 1.7–2.4)

## 2020-10-05 MED ORDER — FUROSEMIDE 10 MG/ML IJ SOLN
40.0000 mg | Freq: Once | INTRAMUSCULAR | Status: AC
  Administered 2020-10-05: 40 mg via INTRAVENOUS
  Filled 2020-10-05: qty 4

## 2020-10-05 MED ORDER — MUPIROCIN 2 % EX OINT
1.0000 "application " | TOPICAL_OINTMENT | Freq: Two times a day (BID) | CUTANEOUS | Status: AC
  Administered 2020-10-05 – 2020-10-09 (×10): 1 via NASAL
  Filled 2020-10-05: qty 22

## 2020-10-05 MED ORDER — CLONAZEPAM 1 MG PO TABS
2.0000 mg | ORAL_TABLET | Freq: Two times a day (BID) | ORAL | Status: DC
  Administered 2020-10-05: 2 mg
  Filled 2020-10-05: qty 2

## 2020-10-05 MED ORDER — SODIUM CHLORIDE 0.9% IV SOLUTION: Freq: Once | INTRAVENOUS | Status: AC

## 2020-10-05 MED ORDER — CHLORHEXIDINE GLUCONATE CLOTH 2 % EX PADS: 6.0000 | MEDICATED_PAD | Freq: Every day | CUTANEOUS | Status: DC

## 2020-10-05 MED ORDER — CLONAZEPAM 1 MG PO TABS
1.0000 mg | ORAL_TABLET | Freq: Once | ORAL | Status: AC
  Administered 2020-10-05: 1 mg
  Filled 2020-10-05: qty 1

## 2020-10-05 MED ORDER — LORAZEPAM 2 MG/ML IJ SOLN
2.0000 mg | INTRAMUSCULAR | Status: DC | PRN
  Administered 2020-10-05 – 2020-10-10 (×3): 2 mg via INTRAVENOUS
  Filled 2020-10-05 (×4): qty 1

## 2020-10-05 NOTE — Progress Notes (Signed)
ANTICOAGULATION CONSULT NOTE  Pharmacy Consult for bivalirudin Indication: ECMO + bilateral DVTs + [facility] 12/26   Recent Labs    10/04/20 0403 10/04/20 1623 10/04/20 1647 10/05/20 0037 10/05/20 0346 10/05/20 0636  HGB 7.8* 8.3*   < > 8.5* 7.8*  7.5* 7.8*  HCT 23.1* 27.1*   < > 25.0* 23.9*  22.0* 23.0*  PLT 144* 174  --   --  176  --   APTT 56* 57*  --   --  52*  --   CREATININE 0.99 1.16  --   --  1.14  --    < > = values in this interval not displayed.    Estimated Creatinine Clearance: 80.5 mL/min (by C-G formula based on SCr of 1.14 mg/dL).   Assessment: 51 yom unvaccinated presenting with severe COVID ARDs, intubated on 12/6, continued to have ventilation issues despite NMB and proning - started on VV ECMO on 12/8. Pt noted to have bilateral DVTs 12/8. Bivalirudin started for ECMO anticoagulation. Head CT 12/26 with new small SAH, repeat 12/27 stable without expansion. Repeat heat CT 1/5 SAH improved.   APTT is therapeutic at 52, on bivalirudin@0 .04 mg/kg/hr. Hgb 7.8, plt 176. No s/sx of bleeding - still unchanged dark stools.   Goal of Therapy:  aPTT 50-70 seconds  Monitor platelets by anticoagulation protocol: Yes   Plan:  Continue bivalirudin at 0.04 mg/kg/hr Recheck aPTTs every 5a & 5p with ECMO labs.  Sherron Monday, PharmD, BCCCP Clinical Pharmacist  Phone: [phone] 10/05/2020 7:49 AM  Please check AMION for all [facility] phone numbers After 10:00 PM, call Main Pharmacy [phone]

## 2020-10-05 NOTE — Procedures (Signed)
Extracorporeal support note     ECLS cannulation: September 18, 2020 Last circuit change: 09/11/2020  Indication: COVID ARDS   Configuration: VV, RIJ 32Fr [place]   Pump speed: 3435 Pump flow: 4.9 L Pump used: Cardiohelp   Sweep gas: 100%, 8 LPM   Circuit check: Clot only in corners of oxygenator- no change Anticoagulant: bivalirudin   Changes in support: Con't aggressive care. Con't antibiotics. PTT goal 50-70. Off IV infusions, con't with scheduled and PRN agitaiton meds. Needs PEG tube given prolonged course of critical illness expected.Marland Kitchen  anticipated goals/duration of support: Bridge to recovery, day 29 of support.  Steffanie Dunn, DO 10/05/20 8:44 AM [facility]

## 2020-10-05 NOTE — Progress Notes (Signed)
CRITICAL VALUE ALERT  Critical Value:  MRSA PCR positive  Date & Time Notied:  10/05/20 1105  Provider Notified: Orders placed per protocol.  Orders Received/Actions taken: MRSA PCR + standing orders.   Leanna Battles, RN

## 2020-10-05 NOTE — Progress Notes (Signed)
Patient has been very agitated since beginning of shift. He is not fidgeting or thrashing in the bed, but is consistently with HR 120-130 RR 40-50, and SBP >160. This RN has administered:   Labetalol 10mg  at 0814 - improved HR, BP Ativan 2mg  at 1033 - no improvement Fentanyl at 1055 - no improvement Versed 2mg  at 1212 - no improvement  Dr. paged at 1230 and informed of the above. Stated to resume precedex drip at this time. RN restarted precedex at 0.87mcg at 1241 and administered an additional fentanyl at 1245. Will continue to closely monitor and adjust drips as needed.  Chestine Spore, RN

## 2020-10-05 NOTE — Progress Notes (Signed)
Set up video chat via [facility] for pt's sister [phone]).

## 2020-10-05 NOTE — Progress Notes (Signed)
Assisted tele visit to patient with family member.  [place] McEachran, RN  

## 2020-10-05 NOTE — Progress Notes (Signed)
NAME:  Donald Rowe, MRN:  [id number], DOB:  1961/07/28, LOS: 32 ADMISSION DATE:  09/05/2020, CONSULTATION DATE:  12/6 REFERRING MD:  Dr. Aileen Fass, CHIEF COMPLAINT:  SOB    Brief History   60 y/o M admitted 12/4 with 1 week hx of SOB, known COVID positive.  He is unvaccinated.  CTA chest negative for PE but demonstrated diffuse bilateral infiltrates. [name] started on 12/8   Past Medical History  Hypothyroidism  [facility] Events/Procedures  12/04 Admit  12/06 PCCM consulted  12/07 Intubated, central line, a line 12/08 VV ECMO Cannulaton, cortrak 12/13 circuit change for hemolytic anemia 12/16 Tracheostomy  12/17 bronch w/ BAL  Consults:  PCCM, Heart failure, TCTS, ECMO  Significant Diagnostic Tests:   CTA Chest 12/4 >> extensive bilateral airspace disease, no large PE identified, limited study   LE Venous Duplex 12/4 >> negative for DVT bilaterally   LE venous duplex 12/8> BLE DVTs  RUE venous duplex 12/23  RUE arterial duplex 12/23  CT head 12/26 : Minimal SAH on left side  CT abdomen and pelvis 12/26: Extensive bilateral airspace disease with small pleural effusion  CT head 12/27: Subarachnoid unchanged  Chest x-ray 1/1: Significant improvement in airspace disease  Micro Data:  COVID 12/4 >> negative  Influenza A/B 12/4 >> negative  MRSA PCR 12/5 >> negative  BCx2 12/4 >> NG Trach aspirate 12/12> rare MRSA BAL 12/17> MRSA, candida Aspergillus Ag BAL 12/17> 0.05 12/18 sars2: positive  Antimicrobials:  Azithromycin 12/5 >> 12/6  Ceftriaxone 12/5 >> 12/6  Cefepime 12/7>12/14 vanc 12/14>12/20 Meropenem 12/17>12/21  Interim history/subjective:  On low dose sedation drips overnight, off this morning. Gets very agitated with significant head bobbing threatening his ECMO cannula when he gets too agitated.  Objective   Blood pressure (!) 162/82, pulse (!) 128, temperature 98.8 F (37.1 C), temperature source Oral, resp. rate (!) 36,  height _0  (1.753 m), weight 98 kg, SpO2 94 %.    Vent Mode: PCV FiO2 (%):  [40 %-50 %] 50 % Set Rate:  [10 bmp] 10 bmp PEEP:  [10 cmH20] 10 cmH20 Plateau Pressure:  [17 cmH20-18 cmH20] 17 cmH20   Intake/Output Summary (Last 24 hours) at 10/05/2020 0850 Last data filed at 10/05/2020 0800 Gross per 24 hour  Intake 2505.72 ml  Output 3175 ml  Net -669.28 ml   Filed Weights   10/03/20 0500 10/04/20 0500 10/05/20 0500  Weight: 101.8 kg 99.3 kg 98 kg    Examination: Gen: critically ill appearing man laying in bed onMV & ECMO HEENT: [place]/AT, eyes anicteric, mild scleral edema, cortrak in place. Neck: supple, RIJ ECMO cannula, trach CV: tachycardic, reg rhythm Pulm: rhales bilaterally, tachypneic, Vt ~200cc Abd: soft, NT Extm: dependent edema, no clubbing Skin: warm, pallor, no rashes Neuro: RASS +2, moving all extremities, CAM ICU +, not opening eyes but raises eyebrows to his name.  CXR personally reviewed> bilateral opacities.   Resolved Hospital Problem list     Assessment & Plan:   Acute hypoxemic and hypercarbic respiratory failure secondary to COVID PNA with severe ARDS. S/p VV ECMO cannulation 09/14/2020.  S/p tracheostomy 02/77 complicated by trach site bleeding improved Trivial left-sided subarachnoid hemorrhage Community-acquired pneumonia with MRSA- completed treatment. -Completed remdesivir, solumedrol, and baricitnib. -Con't full ECMO support, weaning sweep as tolerated. -Completed for treatment with 4 sessions of Plex for hemolysis -Furosemide daily for goal euvolemia  -Completed 7 days vanc for MRSA pneumonia. Con't empiric antibiotics while awaiting bronch cultures.  Agitated  delirium requiring titration of IV sedation.  Periods of severe agitation are limiting ability to wean ECMO -wean off fentanyl and precedex infusions> off after bronch -Continue enteral seroquel, clonazepam, and enteral oxycodone.  Bilateral lower ext DVTs- provoked by COVID  infection -Continue bivalirudin; goal PTT 50-70 -needs 3 months AC for provoked DVT   Steroid-induced hyperglycemia- controlled  -SSI PRN -goal BG 140-180  Hypothyroidism -con't PTA synthroid   Labile hypertension, contributed by periods of agitation.  Hypotension after meds. Not needing pressors or cardene at this point. -space out sedating meds -con't clonidine, hydralazine, and metoprolol -trying to moderate agitation with increased oral agents and IV PRNs to improve BP and agitation control.  Hemolytic anemia - improved. More expected down trending of Hb with expected losses associated with ECMO Throbocytopenia Hyperbilirubinemia Rheumatologic-associated MAHA remains in differential.  hgb and plt are stable -Completed treatment with the Plex for 4 sessions -Transfuse if hgb <8  Vomiting- resolved -con't reglan; can wean off -TF at goal -ok to proceed with PEG  SAH- suspect hypertensive in etiology> resolved on follow up imaging -PTT goals 50-70  Deconditioning -long road of rehab ahead, not able to participate in PT, OT, SLP yet due to delirium -PEG tube  Best practice (evaluated daily)  Diet: TF Pain/Anxiety/Delirium protocol (if indicated): PAD protocol, limit IV meds VAP protocol (if indicated): yes DVT prophylaxis: Bivalirudin GI prophylaxis: pantoprazole  BID Glucose control: SSI Mobility: As tolerated Last date of multidisciplinary goals of care discussion: wife updated at bedside 1/5 Summary of discussion con't aggressive care Follow up goals of care discussion due:  Code Status: Full Disposition: ICU  This patient is critically ill with multiple organ system failure which requires frequent high complexity decision making, assessment, support, evaluation, and titration of therapies. This was completed through the application of advanced monitoring technologies and extensive interpretation of multiple databases. During this encounter critical care time was  devoted to patient care services described in this note for 49 minutes. Multidisciplinary ECMO rounds completed.   [name], DO 10/05/20 9:02 AM [facility]

## 2020-10-05 NOTE — Progress Notes (Signed)
ANTICOAGULATION CONSULT NOTE  Pharmacy Consult for bivalirudin Indication: ECMO + bilateral DVTs + [facility] 12/26   Recent Labs    10/04/20 1623 10/04/20 1647 10/05/20 0346 10/05/20 0636 10/05/20 1112 10/05/20 1540 10/05/20 1547  HGB 8.3*   < > 7.8*  7.5*   < > 7.8* 9.0* 8.8*  HCT 27.1*   < > 23.9*  22.0*   < > 23.0* 28.7* 26.0*  PLT 174  --  176  --   --  195  --   APTT 57*  --  52*  --   --  56*  --   CREATININE 1.16  --  1.14  --   --  1.11  --    < > = values in this interval not displayed.    Estimated Creatinine Clearance: 82.7 mL/min (by C-G formula based on SCr of 1.11 mg/dL).   Assessment: 28 yom unvaccinated presenting with severe COVID ARDs, intubated on 12/6, continued to have ventilation issues despite NMB and proning - started on VV ECMO on 12/8. Pt noted to have bilateral DVTs 12/8. Bivalirudin started for ECMO anticoagulation. Head CT 12/26 with new small SAH, repeat 12/27 stable without expansion. Repeat heat CT 1/5 SAH improved.   APTT is therapeutic at 56, on bivalirudin@0 .04 mg/kg/hr. CBC stable.  Goal of Therapy:  aPTT 50-70 seconds  Monitor platelets by anticoagulation protocol: Yes   Plan:  Continue bivalirudin at 0.04 mg/kg/hr Recheck aPTTs every 5a & 5p with ECMO labs.  Fredonia Highland, PharmD, BCPS, [facility] Clinical Pharmacist [phone] Please check AMION for all [facility] numbers 10/05/2020

## 2020-10-05 NOTE — Progress Notes (Signed)
IR consulted by Dr. Chestine Spore for possible image-guided percutaneous gastrostomy tube placement.  Case/images have been reviewed by Dr. Lowella Dandy who states anatomy favorable for percutaneous gastrostomy tube placement, however patient currently on ECMO requiring anticoagulation use. In addition has NGT in place. Due to logistics of transporting patient to and from IR for procedure, recommend waiting until patient is off ECMO prior to gastrostomy tube placement unless issues with NGT arise. This was communicated between Dr. Lowella Dandy and Dr. Chestine Spore. No plans for IR interventions at this time, will delete order- please re-consult IR when gastrostomy tube is requested (if above recommendations occur).  Please call IR with questions/concerns.   Donald Boga Odetta Forness, PA-C 10/05/2020, 4:12 PM

## 2020-10-05 NOTE — Plan of Care (Signed)
  Problem: Education: Goal: Knowledge of risk factors and measures for prevention of condition will improve Outcome: Progressing   Problem: Coping: Goal: Psychosocial and spiritual needs will be supported Outcome: Progressing   Problem: Respiratory: Goal: Will maintain a patent airway Outcome: Progressing Goal: Complications related to the disease process, condition or treatment will be avoided or minimized Outcome: Progressing   Problem: Fluid Volume: Goal: Hemodynamic stability will improve Outcome: Progressing   Problem: Clinical Measurements: Goal: Diagnostic test results will improve Outcome: Progressing Goal: Signs and symptoms of infection will decrease Outcome: Progressing   Problem: Respiratory: Goal: Ability to maintain adequate ventilation will improve Outcome: Progressing   Problem: Education: Goal: Knowledge of General Education information will improve Description: Including pain rating scale, medication(s)/side effects and non-pharmacologic comfort measures Outcome: Progressing   Problem: Health Behavior/Discharge Planning: Goal: Ability to manage health-related needs will improve Outcome: Progressing   Problem: Clinical Measurements: Goal: Ability to maintain clinical measurements within normal limits will improve Outcome: Progressing Goal: Will remain free from infection Outcome: Progressing Goal: Diagnostic test results will improve Outcome: Progressing Goal: Respiratory complications will improve Outcome: Progressing Goal: Cardiovascular complication will be avoided Outcome: Progressing   Problem: Activity: Goal: Risk for activity intolerance will decrease Outcome: Progressing   Problem: Nutrition: Goal: Adequate nutrition will be maintained Outcome: Progressing   Problem: Coping: Goal: Level of anxiety will decrease Outcome: Progressing   Problem: Elimination: Goal: Will not experience complications related to bowel motility Outcome:  Progressing Goal: Will not experience complications related to urinary retention Outcome: Progressing   Problem: Pain Managment: Goal: General experience of comfort will improve Outcome: Progressing   Problem: Safety: Goal: Ability to remain free from injury will improve Outcome: Progressing   Problem: Skin Integrity: Goal: Risk for impaired skin integrity will decrease Outcome: Progressing   Problem: Activity: Goal: Ability to tolerate increased activity will improve Outcome: Progressing   Problem: Respiratory: Goal: Ability to maintain a clear airway and adequate ventilation will improve Outcome: Progressing   Problem: Role Relationship: Goal: Method of communication will improve Outcome: Progressing   Problem: Safety: Goal: Non-violent Restraint(s) Outcome: Progressing

## 2020-10-05 NOTE — Progress Notes (Signed)
Patient ID: Donald Rowe, male   DOB: [birth year], 60 y.o.   MRN: [id number]    Advanced Heart Failure Rounding Note   Subjective:    12/8 Cannulated for VV ECMO - 32 FR  RIJ [place] 12/9 Extubated/Reintubated for altered mental status 12/13 Circuit change for elevated LDH 12/15 PLEX started 12/16 Tracheostomy 12/17 MRSA in sputum (negative cx 12/26) 12/23 head CT (normal) for change in mental status.  12/26 Pan CT for drop in hgb. Small SAH. Severe lung disease 12/27 CVVH begun 1/1 CVVH stopped 1/3 Foley placed for urinary retention 1/5  CT head and chest - resolution of SAH. Severe bilateral airspace disease, slightly improved. Pneumomediastinum resolved.   Continues to be agitated. Not following commands today. Sedation and anti-HTN meds needing frequent adjustments but no severe HTN or hypotension. Has Foley in place with bloody urine. ECMO circuit functioning well. Sweep up to 8.   Bronch 1/5. + purulent secretions in LLL. Vanc/cefepime restarted, Cultures pending  ECMO   Speed 3450 Flow 4.89 Sweep 8 dP 26 pVen -80  ABG: 7.31/61/71/92% Hgb 7.8 LDH 518 -> 488 -> 514 -> 498 -> 539 -> 542 -> 525 Lactic acid 0.6 PTT 52  Vent 50%  TV ~200-300cc  Objective:   Weight Range:  Vital Signs:   Temp:  [97.6 F (36.4 C)-98.8 F (37.1 C)] 98.8 F (37.1 C) (01/06 0400) Pulse Rate:  [84-128] 128 (01/06 0802) Resp:  [11-40] 36 (01/06 0700) BP: (90-163)/(46-100) 162/82 (01/06 0700) SpO2:  [89 %-99 %] 94 % (01/06 0700) Arterial Line BP: (92-198)/(39-79) 163/63 (01/06 0700) FiO2 (%):  [40 %-50 %] 50 % (01/06 0802) Weight:  [98 kg] 98 kg (01/06 0500) Last BM Date: 10/05/19  Weight change: Filed Weights   10/03/20 0500 10/04/20 0500 10/05/20 0500  Weight: 101.8 kg 99.3 kg 98 kg    Intake/Output:   Intake/Output Summary (Last 24 hours) at 10/05/2020 0855 Last data filed at 10/05/2020 0800 Gross per 24 hour  Intake 2505.72 ml  Output 3175 ml  Net -669.28 ml      Physical Exam: General:  Awake. Agitated. Will withdraw to noxious stimuli. Not following commands. Tachypneic with shallow breathing HEENT: normal + cor-trak Neck: supple. RIJ ECMO + trach  Carotids 2+ bilat; no bruits. No lymphadenopathy or thryomegaly appreciated. Cor: PMI nondisplaced. Regular tachy Lungs: coarse tachypneic Abdomen: soft, nontender, nondistended. No hepatosplenomegaly. No bruits or masses. Good bowel sounds. Extremities: no cyanosis, clubbing, rash, trace edema + Folwy with bloody urine Neuro: as above.    Telemetry: sinus 110-120s Personally reviewed    Labs: Basic Metabolic Panel: Recent Labs  Lab 10/01/20 0508 10/01/20 0518 10/02/20 0401 10/02/20 0411 10/03/20 0320 10/03/20 1133 10/03/20 1656 10/03/20 1658 10/04/20 0403 10/04/20 1623 10/04/20 1647 10/04/20 1855 10/04/20 1928 10/05/20 0037 10/05/20 0346 10/05/20 0636  NA 140   < > 138   < > 140  140   < > 138   < > 141 143   < > 145 144 147* 144  148* 148*  K 4.6   < > 4.2   < > 4.6  4.7   < > 4.1   < > 3.8 4.9   < > 4.4 4.8 4.5 4.8  4.3 3.9  CL 106   < > 104   < > 106  --  107  --  111 109  --   --   --   --  108  --   CO2 23   < >  24   < > 26  --  25  --  24 29  --   --   --   --  29  --   GLUCOSE 69*   < > 99   < > 111*  --  110*  --  102* 114*  --   --   --   --  133*  --   BUN 64*   < > 70*   < > 70*  --  62*  --  53* 55*  --   --   --   --  63*  --   CREATININE 1.23   < > 1.34*   < > 1.28*  --  1.21  --  0.99 1.16  --   --   --   --  1.14  --   CALCIUM 9.0   < > 9.0   < > 8.9  --  8.8*  --  7.7* 8.9  --   --   --   --  8.9  --   MG 3.0*  --  2.8*  --  2.5*  --   --   --  2.0  --   --   --   --   --  2.2  --   PHOS 4.4   < > 6.4*   < > 5.5*  --  4.6  --  3.2 4.5  --   --   --   --  2.8  --    < > = values in this interval not displayed.    Liver Function Tests: Recent Labs  Lab 10/02/20 0401 10/02/20 1600 10/03/20 0320 10/03/20 1656 10/04/20 0403 10/04/20 1623  10/05/20 0346  AST 48*  --   --   --   --   --   --   ALT 46*  --   --   --   --   --   --   ALKPHOS 141*  --   --   --   --   --   --   BILITOT 1.1  --   --   --   --   --   --   PROT 5.9*  --   --   --   --   --   --   ALBUMIN 3.0*  3.0*   < > 2.7* 2.6* 2.3* 2.7* 2.6*   < > = values in this interval not displayed.   No results for input(s): LIPASE, AMYLASE in the last 168 hours. No results for input(s): AMMONIA in the last 168 hours.  CBC: Recent Labs  Lab 09/28/20 2000 09/28/20 2014 10/03/20 0434 10/03/20 1133 10/03/20 1656 10/03/20 1658 10/04/20 0403 10/04/20 1623 10/04/20 1647 10/04/20 1855 10/04/20 1928 10/05/20 0037 10/05/20 0346 10/05/20 0636  WBC 9.8   < > 8.2  --  6.5  --  7.5 10.3  --   --   --   --  7.4  --   NEUTROABS 6.0  --   --   --   --   --   --   --   --   --   --   --   --   --   HGB 9.1*   < > 8.4*   < > 7.7*   < > 7.8* 8.3*   < > 8.5* 7.8* 8.5* 7.8*  7.5* 7.8*  HCT 28.0*   < > 26.4*   < >  23.2*   < > 23.1* 27.1*   < > 25.0* 23.0* 25.0* 23.9*  22.0* 23.0*  MCV 94.6   < > 96.7  --  95.5  --  96.3 100.4*  --   --   --   --  96.8  --   PLT 104*   < > 133*  --  127*  --  144* 174  --   --   --   --  176  --    < > = values in this interval not displayed.    Cardiac Enzymes: No results for input(s): CKTOTAL, CKMB, CKMBINDEX, TROPONINI in the last 168 hours.  BNP: BNP (last 3 results) No results for input(s): BNP in the last 8760 hours.  ProBNP (last 3 results) No results for input(s): PROBNP in the last 8760 hours.    Other results:  Imaging: CT HEAD WO CONTRAST  Result Date: 10/04/2020 CLINICAL DATA:  Subarachnoid hemorrhage. EXAM: CT HEAD WITHOUT CONTRAST TECHNIQUE: Contiguous axial images were obtained from the base of the skull through the vertex without intravenous contrast. COMPARISON:  September 25, 2020. FINDINGS: Brain: No evidence of acute infarction, hemorrhage, hydrocephalus, extra-axial collection or mass lesion/mass effect.  Subarachnoid hemorrhage noted on prior exam is not visualized currently. Vascular: No hyperdense vessel or unexpected calcification. Skull: Normal. Negative for fracture or focal lesion. Sinuses/Orbits: No acute finding. Other: Fluid is noted in mastoid air cells bilaterally. IMPRESSION: Subarachnoid hemorrhage noted on prior exam is not visualized currently. No acute intracranial abnormality seen. Electronically Signed   By: Marijo Conception M.D.   On: 10/04/2020 10:03   CT CHEST WO CONTRAST  Result Date: 10/04/2020 CLINICAL DATA:  History of COVID-19 positivity with respiratory distress EXAM: CT CHEST WITHOUT CONTRAST TECHNIQUE: Multidetector CT imaging of the chest was performed following the standard protocol without IV contrast. COMPARISON:  CT from 09/24/2020, chest x-ray from earlier in the same day. FINDINGS: Cardiovascular: Thoracic aorta is well visualize without aneurysmal dilatation. No significant cardiac enlargement is noted. Relative decreased attenuation of the cardiac blood pool is noted suggestive of underlying anemia. No dilatation of the pulmonary artery is seen. ECMO cannula is noted on the right extending into the IVC. Coronary calcifications are noted. Left-sided PICC line is noted in place. Mediastinum/Nodes: Thoracic inlet is within normal limits. Tracheostomy tube is noted in place. Gastric catheter extends into the stomach. No sizable hilar or mediastinal adenopathy is noted. The esophagus as visualized is within normal limits. Previously seen pneumomediastinum has resolved in the interval. Lungs/Pleura: Diffuse bilateral airspace opacity is again identified but slightly improved when compared with the prior exam. No sizable effusions are noted. Upper Abdomen: Visualized upper abdomen shows no acute abnormality. Musculoskeletal: No chest wall mass or suspicious bone lesions identified. IMPRESSION: Diffuse airspace opacities and edema consistent with the known history of ARDS and COVID-19  pneumonia. Improved aeration is noted when compared with the prior CT. Resolution of previously seen pneumomediastinum. Tubes and lines as described above. Electronically Signed   By: Inez Catalina M.D.   On: 10/04/2020 10:10   DG CHEST PORT 1 VIEW  Result Date: 10/05/2020 CLINICAL DATA:  Tracheostomy.  ECMO.  Recent COVID. EXAM: PORTABLE CHEST 1 VIEW COMPARISON:  CT 10/04/2020.  Chest x-ray 10/04/2020. FINDINGS: Interim removal of NG tube and placement of feeding tube. Tracheostomy tube, left PICC line, ECMO catheter stable position. Stable cardiomegaly. Diffuse severe bilateral pulmonary infiltrates again noted without interim change. No pleural effusion or pneumothorax. IMPRESSION: 1. Interim  removal of NG tube and placement of feeding tube. 2. Remaining lines and tubes stable. 3. Diffuse severe bilateral pulmonary infiltrates again noted without interim change. Electronically Signed   By: Marcello Moores  Register   On: 10/05/2020 06:29   DG CHEST PORT 1 VIEW  Result Date: 10/04/2020 CLINICAL DATA:  Respiratory failure, COVID pneumonia, ECMO EXAM: PORTABLE CHEST 1 VIEW COMPARISON:  10/03/2020 FINDINGS: Tracheostomy, nasogastric tube extending into the upper abdomen beyond the margin of the examination, and left upper extremity PICC line with its tip within the superior vena cava are unchanged. ECMO cannula has been slightly withdrawn with its proximal marker overlying the superior vena cava, its middle marker over the expected superior cavoatrial junction, and its distal marker likely within the a inferior right atrium.l Pulmonary insufflation is preserved. Lung volumes are small. Extensive diffuse airspace infiltrate appears stable with numerous air bronchograms noted within the lung bases bilaterally. No pneumothorax or pleural effusion. IMPRESSION: ECMO catheter slightly withdrawn. Advancement of approximately 3-4 cm may better position the middle cannula within the right atrium. Otherwise stable support lines  and tubes. Stable pulmonary insufflation.  Lung volumes are small. Stable diffuse extensive airspace infiltrate, likely infectious. Electronically Signed   By: Fidela Salisbury MD   On: 10/04/2020 06:43   DG Abd Portable 1V  Result Date: 10/04/2020 CLINICAL DATA:  Nasogastric tube placement EXAM: PORTABLE ABDOMEN - 1 VIEW COMPARISON:  09/30/2020 FINDINGS: Nasogastric tube with the tip projecting over the expected location of the proximal jejunum. No bowel dilatation to suggest obstruction. No evidence of pneumoperitoneum, portal venous gas or pneumatosis. No pathologic calcifications along the expected course of the ureters. No acute osseous abnormality. IMPRESSION: Nasogastric tube with the tip projecting over the expected location of the proximal jejunum. Electronically Signed   By: Kathreen Devoid   On: 10/04/2020 11:14     Medications:     Scheduled Medications: . amLODipine  10 mg Per Tube QHS  . chlorhexidine gluconate (MEDLINE KIT)  15 mL Mouth Rinse BID  . Chlorhexidine Gluconate Cloth  6 each Topical Daily  . clonazePAM  2 mg Per Tube BID  . cloNIDine  0.1 mg Per Tube TID  . docusate  100 mg Per Tube BID  . feeding supplement (PROSource TF)  45 mL Per Tube QID  . fiber  1 packet Per Tube BID  . insulin aspart  2-6 Units Subcutaneous Q4H  . levothyroxine  50 mcg Per Tube Q0600  . mouth rinse  15 mL Mouth Rinse 10 times per day  . metoCLOPramide (REGLAN) injection  10 mg Intravenous Q6H  . metoprolol tartrate  50 mg Per Tube Q12H  . nystatin  5 mL Per Tube QID  . oxyCODONE  15 mg Per Tube Q6H  . pantoprazole sodium  40 mg Per Tube BID  . polyethylene glycol  17 g Per Tube BID  . pravastatin  40 mg Per Tube q1800  . QUEtiapine  150 mg Per Tube TID  . sodium chloride flush  10-40 mL Intracatheter Q12H    Infusions: . sodium chloride 250 mL (10/04/20 1831)  . albumin human 12.5 g (09/29/20 1644)  . bivalirudin (ANGIOMAX) infusion 0.5 mg/mL (Non-ACS indications) 0.04 mg/kg/hr  (10/05/20 0733)  . ceFEPime (MAXIPIME) IV Stopped (10/05/20 0558)  . dexmedetomidine (PRECEDEX) IV infusion Stopped (10/05/20 0640)  . feeding supplement (PIVOT 1.5 CAL) 1,000 mL (10/04/20 1957)  . fentaNYL infusion INTRAVENOUS Stopped (10/05/20 0640)  . niCARDipine Stopped (09/30/20 1761)  . vancomycin 1,750 mg (  10/05/20 1751)    PRN Medications: acetaminophen (TYLENOL) oral liquid 160 mg/5 mL, albumin human, albuterol, diphenhydrAMINE, fentaNYL, Gerhardt's butt cream, guaiFENesin-dextromethorphan, haloperidol lactate, labetalol, lip balm, LORazepam, midazolam, ondansetron **OR** ondansetron (ZOFRAN) IV, phenylephrine, sodium chloride flush   Assessment/Plan:   1. Acute hypoxic respiratory failure/ARDS in setting of COVID-19 PNA - Cannulated for VV ECMO on 12/8 after failing intubation x 2 days - has complete remedisivir, baricitinib, Solumedrol.  - s/p trach 12/16 - CT chest 12/26 with severe lung disease and pneumomediastinum.  - 12/17 MRSA in sputum (negative cx 12/26) - Restarted vanc/cefepime 12/26 => completed course.  - Increasing secretions. -Sweep back up overnight. Remains tachypneic.   - Vanc/cefepime restarted 1/5. Await BAL cultures - Circuit changed 12/13 for elevated LDH and lower flows. PLEX started 12/15. S/p PLEX #4/4 on 12/21. LDH has been stable. Circuit parameters stable.   - CVVH started 12/27 with intractable volume overload, stopped 1/1.  Now respoding well to IV lasix. Weight down. Creatinine stable.Dose lasix as needed. Not needed today - Bivalirudin with PTT goal back up to 50-70. PTT 52 today -  CT head and chest 10/04/20 with resolution of SAH. Still with severe bilateral airspace disease, slightly improved. Pneumomediastinum has resolved.  - CT findings a bit reassuring but still with a long way to go. Continue to wean sweep and try to get sedation down   2. Bilateral DVT - remains on bivalirudin - Discussed dosing with PharmD personally.  3. DM2 -  Insulin adjusted. CBGs improved   4. F/E/N - Had ileus over weekend. Reglan back on. Now tolerating full TFs - Consult IR for PEG feeding tube  6. HTN - Remains very labile  - Meds adjusted again  7. Rectal bleeding - much improved  8. AKI - Now off CVVH, Creatinine 1.14 today  9. Acute encephalopathy - will respond to pain and occasionally follow simple commands. Full assessment limited by severe agitation and need for sedation to protect circuit. No changed. Unable to participate with PT - head CT ok 09/07/20 and 12/21 - head CT 12/26. Small SAH.  Repeat CT on 12/27 with unchanged small SAH.  - head CT 10/04/20 SAH resolved  10. Small SAH - on CT 12/26 - repeat CT 12/27 unchanged small SAH.  - PTT goal reduced 40-50 for several days, plan to go back to 50-70 today.  - avoid severe HTN as best as possible - head CT 10/04/20 SAH resolved  11. Gastric ileus -  improved with Reglan. TFs.  - Consult IR for PEG  12. Urinary retention  - Foley placed 10/03/20 - add flomax  13. Anemia - Transfuse hgb < 8.  - Has been getting periodic blood without overt GI bleeding. No change  Plan discussed on multi-discplinary ECMO rounds with CCM, ECMO coordinator/specialist, PharmDs and RNs.  CRITICAL CARE Performed by: Glori Bickers  Total critical care time: 35 minutes  Critical care time was exclusive of separately billable procedures and treating other patients.  Critical care was necessary to treat or prevent imminent or life-threatening deterioration.  Critical care was time spent personally by me (independent of midlevel providers or residents) on the following activities: development of treatment plan with patient and/or surrogate as well as nursing, discussions with consultants, evaluation of patient's response to treatment, examination of patient, obtaining history from patient or surrogate, ordering and performing treatments and interventions, ordering and review of  laboratory studies, ordering and review of radiographic studies, pulse oximetry and re-evaluation of patient's condition.  Length of Stay: 86   Glori Bickers MD 10/05/2020, 8:55 AM  Advanced Heart Failure Team Pager [phone] (M-F; [place])  Please contact [facility] for night-coverage after hours (4p -7a ) and weekends on [email or web address]

## 2020-10-05 NOTE — Progress Notes (Signed)
   Palliative Medicine Inpatient Follow Up Note  Reason for consult:  "ECMO"  HPI:  Per intake H&P --> Donald Hughartis a 59 y.o.malewith a pertinent history ofhypothyroidism on levothyroxine and hyperlipidemia who was diagnosed with Covid 1 week ago and is not vaccinated. With EMS he was seen to be desaturating to 78% on room air. He states he started having symptoms on Friday and tested positive on Saturday.  Had initially elected to be DNAR without intubation though later changed this and got intubated on 12/6  in the setting of worsening respiratory failure with hypoxia. CTA chest w/ diffuse bilateral infiltrates. On 12/8 was cannulated for VV ECMO. Had been extubated on 12/9 then emergently reintubated in early evening due to clinical instability.   Palliative care was asked to get involved in the setting of serious illness/ ECMO to set goals and expectations   Today's Discussion (10/05/2020):  Chart reviewed.  Case reviewed with team.    Patient with  Continued delirium/agitation on Fentanyl gtt and Seroquel and Precidex gtt.  Tachycardic.  Anticipated PEG placement tomorrow  Patients wife, Donald Rowe updated over the phone. She was provided with a update regarding patients clinical state. We discussed his present and ongoing delirium and utilization of mediations and their side effect    Discussed PEG placement   Created space and opportunity for Donald Rowe to explore her thoughts and feeling regarding her husbands current medical situation.  She understands the seriousness of the current medical situation.     Emotional support offered.  Education offered on self-care.  Questions and concerns addressed    SUMMARY OF RECOMMENDATIONS Full Code/Full Scope of Care  ECMO as a bridge to recovery  Spiritual support appreciated  Ongoing PMT support    Time Spent: 25 Greater than 50% of the time was spent in counseling and coordination of  care ______________________________________________________________________________________ Lorinda Creed NP [facility] Palliative Medicine Team Team Cell Phone: [phone] Please utilize secure chat with additional questions, if there is no response within 30 minutes please call the above phone number  Palliative Medicine Team providers are available by phone from 7am to 7pm daily and can be reached through the team cell phone.  Should this patient require assistance outside of these hours, please call the patient's attending physician.

## 2020-10-06 ENCOUNTER — Inpatient Hospital Stay (HOSPITAL_COMMUNITY): Payer: HRSA Program

## 2020-10-06 DIAGNOSIS — Z9281 Personal history of extracorporeal membrane oxygenation (ECMO): Secondary | ICD-10-CM | POA: Diagnosis not present

## 2020-10-06 DIAGNOSIS — R739 Hyperglycemia, unspecified: Secondary | ICD-10-CM | POA: Diagnosis not present

## 2020-10-06 DIAGNOSIS — Z515 Encounter for palliative care: Secondary | ICD-10-CM | POA: Diagnosis not present

## 2020-10-06 DIAGNOSIS — Z7189 Other specified counseling: Secondary | ICD-10-CM | POA: Diagnosis not present

## 2020-10-06 DIAGNOSIS — U071 COVID-19: Secondary | ICD-10-CM | POA: Diagnosis not present

## 2020-10-06 DIAGNOSIS — J9601 Acute respiratory failure with hypoxia: Secondary | ICD-10-CM | POA: Diagnosis not present

## 2020-10-06 DIAGNOSIS — Z9911 Dependence on respirator [ventilator] status: Secondary | ICD-10-CM | POA: Diagnosis not present

## 2020-10-06 DIAGNOSIS — J80 Acute respiratory distress syndrome: Secondary | ICD-10-CM | POA: Diagnosis not present

## 2020-10-06 LAB — CBC
HCT: 26.7 % — ABNORMAL LOW (ref 39.0–52.0)
HCT: 26.5 % — ABNORMAL LOW (ref 39.0–52.0)
Hemoglobin: 8.6 g/dL — ABNORMAL LOW (ref 13.0–17.0)
Hemoglobin: 8 g/dL — ABNORMAL LOW (ref 13.0–17.0)
MCH: 31.9 pg (ref 26.0–34.0)
MCH: 30.3 pg (ref 26.0–34.0)
MCHC: 32.2 g/dL (ref 30.0–36.0)
MCHC: 30.2 g/dL (ref 30.0–36.0)
MCV: 98.9 fL (ref 80.0–100.0)
MCV: 100.4 fL — ABNORMAL HIGH (ref 80.0–100.0)
Platelets: 183 10*3/uL (ref 150–400)
Platelets: 180 10*3/uL (ref 150–400)
RBC: 2.7 MIL/uL — ABNORMAL LOW (ref 4.22–5.81)
RBC: 2.64 MIL/uL — ABNORMAL LOW (ref 4.22–5.81)
RDW: 16.7 % — ABNORMAL HIGH (ref 11.5–15.5)
RDW: 16.8 % — ABNORMAL HIGH (ref 11.5–15.5)
WBC: 6.9 10*3/uL (ref 4.0–10.5)
WBC: 6.3 10*3/uL (ref 4.0–10.5)
nRBC: 0 % (ref 0.0–0.2)
nRBC: 0 % (ref 0.0–0.2)

## 2020-10-06 LAB — RENAL FUNCTION PANEL
Albumin: 2.4 g/dL — ABNORMAL LOW (ref 3.5–5.0)
Albumin: 2.3 g/dL — ABNORMAL LOW (ref 3.5–5.0)
Anion gap: 9 (ref 5–15)
Anion gap: 8 (ref 5–15)
BUN: 64 mg/dL — ABNORMAL HIGH (ref 6–20)
BUN: 69 mg/dL — ABNORMAL HIGH (ref 6–20)
CO2: 28 mmol/L (ref 22–32)
CO2: 29 mmol/L (ref 22–32)
Calcium: 9 mg/dL (ref 8.9–10.3)
Calcium: 9.2 mg/dL (ref 8.9–10.3)
Chloride: 111 mmol/L (ref 98–111)
Chloride: 113 mmol/L — ABNORMAL HIGH (ref 98–111)
Creatinine, Ser: 1.13 mg/dL (ref 0.61–1.24)
Creatinine, Ser: 1.11 mg/dL (ref 0.61–1.24)
GFR, Estimated: >60 mL/min (ref >60)
GFR, Estimated: >60 mL/min (ref >60)
Glucose, Bld: 162 mg/dL — ABNORMAL HIGH (ref 70–99)
Glucose, Bld: 152 mg/dL — ABNORMAL HIGH (ref 70–99)
Phosphorus: 3.4 mg/dL (ref 2.5–4.6)
Phosphorus: 2.9 mg/dL (ref 2.5–4.6)
Potassium: 3.6 mmol/L (ref 3.5–5.1)
Potassium: 4.7 mmol/L (ref 3.5–5.1)
Sodium: 148 mmol/L — ABNORMAL HIGH (ref 135–145)
Sodium: 150 mmol/L — ABNORMAL HIGH (ref 135–145)

## 2020-10-06 LAB — POCT I-STAT 7, (LYTES, BLD GAS, ICA,H+H)
Acid-Base Excess: 5 mmol/L — ABNORMAL HIGH (ref 0.0–2.0)
Acid-Base Excess: 5 mmol/L — ABNORMAL HIGH (ref 0.0–2.0)
Acid-Base Excess: 5 mmol/L — ABNORMAL HIGH (ref 0.0–2.0)
Acid-Base Excess: 6 mmol/L — ABNORMAL HIGH (ref 0.0–2.0)
Acid-Base Excess: 5 mmol/L — ABNORMAL HIGH (ref 0.0–2.0)
Bicarbonate: 33.1 mmol/L — ABNORMAL HIGH (ref 20.0–28.0)
Bicarbonate: 32.8 mmol/L — ABNORMAL HIGH (ref 20.0–28.0)
Bicarbonate: 31.9 mmol/L — ABNORMAL HIGH (ref 20.0–28.0)
Bicarbonate: 32.1 mmol/L — ABNORMAL HIGH (ref 20.0–28.0)
Bicarbonate: 31.4 mmol/L — ABNORMAL HIGH (ref 20.0–28.0)
Calcium, Ion: 1.37 mmol/L (ref 1.15–1.40)
Calcium, Ion: 1.37 mmol/L (ref 1.15–1.40)
Calcium, Ion: 1.38 mmol/L (ref 1.15–1.40)
Calcium, Ion: 1.4 mmol/L (ref 1.15–1.40)
Calcium, Ion: 1.38 mmol/L (ref 1.15–1.40)
HCT: 26 % — ABNORMAL LOW (ref 39.0–52.0)
HCT: 26 % — ABNORMAL LOW (ref 39.0–52.0)
HCT: 24 % — ABNORMAL LOW (ref 39.0–52.0)
HCT: 22 % — ABNORMAL LOW (ref 39.0–52.0)
HCT: 26 % — ABNORMAL LOW (ref 39.0–52.0)
Hemoglobin: 8.8 g/dL — ABNORMAL LOW (ref 13.0–17.0)
Hemoglobin: 8.8 g/dL — ABNORMAL LOW (ref 13.0–17.0)
Hemoglobin: 8.2 g/dL — ABNORMAL LOW (ref 13.0–17.0)
Hemoglobin: 7.5 g/dL — ABNORMAL LOW (ref 13.0–17.0)
Hemoglobin: 8.8 g/dL — ABNORMAL LOW (ref 13.0–17.0)
O2 Saturation: 92 %
O2 Saturation: 93 %
O2 Saturation: 92 %
O2 Saturation: 84 %
O2 Saturation: 92 %
Patient temperature: 36.8
Patient temperature: 98.6
Patient temperature: 36.8
Patient temperature: 36.8
Patient temperature: 36.9
Potassium: 4.4 mmol/L (ref 3.5–5.1)
Potassium: 4.6 mmol/L (ref 3.5–5.1)
Potassium: 4.6 mmol/L (ref 3.5–5.1)
Potassium: 4.7 mmol/L (ref 3.5–5.1)
Potassium: 4.4 mmol/L (ref 3.5–5.1)
Sodium: 151 mmol/L — ABNORMAL HIGH (ref 135–145)
Sodium: 152 mmol/L — ABNORMAL HIGH (ref 135–145)
Sodium: 153 mmol/L — ABNORMAL HIGH (ref 135–145)
Sodium: 152 mmol/L — ABNORMAL HIGH (ref 135–145)
Sodium: 152 mmol/L — ABNORMAL HIGH (ref 135–145)
TCO2: 35 mmol/L — ABNORMAL HIGH (ref 22–32)
TCO2: 35 mmol/L — ABNORMAL HIGH (ref 22–32)
TCO2: 34 mmol/L — ABNORMAL HIGH (ref 22–32)
TCO2: 34 mmol/L — ABNORMAL HIGH (ref 22–32)
TCO2: 33 mmol/L — ABNORMAL HIGH (ref 22–32)
pCO2 arterial: 68.7 mmHg (ref 32.0–48.0)
pCO2 arterial: 67.1 mmHg (ref 32.0–48.0)
pCO2 arterial: 60.8 mmHg — ABNORMAL HIGH (ref 32.0–48.0)
pCO2 arterial: 59.2 mmHg — ABNORMAL HIGH (ref 32.0–48.0)
pCO2 arterial: 54.7 mmHg — ABNORMAL HIGH (ref 32.0–48.0)
pH, Arterial: 7.291 — ABNORMAL LOW (ref 7.350–7.450)
pH, Arterial: 7.297 — ABNORMAL LOW (ref 7.350–7.450)
pH, Arterial: 7.327 — ABNORMAL LOW (ref 7.350–7.450)
pH, Arterial: 7.341 — ABNORMAL LOW (ref 7.350–7.450)
pH, Arterial: 7.366 (ref 7.350–7.450)
pO2, Arterial: 73 mmHg — ABNORMAL LOW (ref 83.0–108.0)
pO2, Arterial: 76 mmHg — ABNORMAL LOW (ref 83.0–108.0)
pO2, Arterial: 71 mmHg — ABNORMAL LOW (ref 83.0–108.0)
pO2, Arterial: 53 mmHg — ABNORMAL LOW (ref 83.0–108.0)
pO2, Arterial: 68 mmHg — ABNORMAL LOW (ref 83.0–108.0)

## 2020-10-06 LAB — FIBRINOGEN: Fibrinogen: 329 mg/dL (ref 210–475)

## 2020-10-06 LAB — GLUCOSE, CAPILLARY
Glucose-Capillary: 147 mg/dL — ABNORMAL HIGH (ref 70–99)
Glucose-Capillary: 135 mg/dL — ABNORMAL HIGH (ref 70–99)
Glucose-Capillary: 147 mg/dL — ABNORMAL HIGH (ref 70–99)
Glucose-Capillary: 148 mg/dL — ABNORMAL HIGH (ref 70–99)
Glucose-Capillary: 137 mg/dL — ABNORMAL HIGH (ref 70–99)

## 2020-10-06 LAB — PROTIME-INR
INR: 1.5 — ABNORMAL HIGH (ref 0.8–1.2)
Prothrombin Time: 17.5 seconds — ABNORMAL HIGH (ref 11.4–15.2)

## 2020-10-06 LAB — LACTATE DEHYDROGENASE: LDH: 516 U/L — ABNORMAL HIGH (ref 98–192)

## 2020-10-06 LAB — MAGNESIUM: Magnesium: 2.4 mg/dL (ref 1.7–2.4)

## 2020-10-06 LAB — APTT
aPTT: 53 seconds — ABNORMAL HIGH (ref 24–36)
aPTT: 62 seconds — ABNORMAL HIGH (ref 24–36)

## 2020-10-06 LAB — LACTIC ACID, PLASMA
Lactic Acid, Venous: 0.7 mmol/L (ref 0.5–1.9)
Lactic Acid, Venous: 0.9 mmol/L (ref 0.5–1.9)

## 2020-10-06 MED ORDER — NOREPINEPHRINE 4 MG/250ML-% IV SOLN
INTRAVENOUS | Status: AC
  Administered 2020-10-06: 4 mg
  Filled 2020-10-06: qty 250

## 2020-10-06 MED ORDER — DIPHENHYDRAMINE HCL 12.5 MG/5ML PO ELIX
25.0000 mg | ORAL_SOLUTION | Freq: Four times a day (QID) | ORAL | Status: DC | PRN
  Administered 2020-10-06: 25 mg via ORAL
  Filled 2020-10-06: qty 10

## 2020-10-06 MED ORDER — FREE WATER
200.0000 mL | Freq: Four times a day (QID) | Status: DC
  Administered 2020-10-06 – 2020-10-07 (×4): 200 mL

## 2020-10-06 MED ORDER — METOCLOPRAMIDE HCL 5 MG/ML IJ SOLN
5.0000 mg | Freq: Four times a day (QID) | INTRAMUSCULAR | Status: DC
  Administered 2020-10-06 – 2020-10-07 (×4): 5 mg via INTRAVENOUS
  Filled 2020-10-06 (×4): qty 2

## 2020-10-06 MED ORDER — OXYCODONE HCL 5 MG/5ML PO SOLN
15.0000 mg | ORAL | Status: DC
Start: 2020-10-06 — End: 2020-10-06

## 2020-10-06 MED ORDER — OXYCODONE HCL 5 MG/5ML PO SOLN
10.0000 mg | ORAL | Status: DC
  Administered 2020-10-06 – 2020-10-11 (×29): 10 mg
  Filled 2020-10-06 (×29): qty 10

## 2020-10-06 MED ORDER — AMLODIPINE BESYLATE 10 MG PO TABS
10.0000 mg | ORAL_TABLET | Freq: Every day | ORAL | Status: DC
  Administered 2020-10-06 – 2020-10-11 (×6): 10 mg
  Filled 2020-10-06 (×7): qty 1

## 2020-10-06 MED ORDER — LIDOCAINE 5 % EX PTCH
1.0000 | MEDICATED_PATCH | CUTANEOUS | Status: DC
  Administered 2020-10-06 – 2020-10-11 (×6): 1 via TRANSDERMAL
  Filled 2020-10-06 (×7): qty 1

## 2020-10-06 MED ORDER — CHLORHEXIDINE GLUCONATE 0.12 % MT SOLN
OROMUCOSAL | Status: AC
  Administered 2020-10-06: 15 mL via OROMUCOSAL
  Filled 2020-10-06: qty 15

## 2020-10-06 MED ORDER — CLONAZEPAM 0.5 MG PO TABS
0.5000 mg | ORAL_TABLET | Freq: Three times a day (TID) | ORAL | Status: DC
  Administered 2020-10-06 – 2020-10-12 (×19): 0.5 mg
  Filled 2020-10-06 (×19): qty 1

## 2020-10-06 MED ORDER — HALOPERIDOL LACTATE 5 MG/ML IJ SOLN
2.0000 mg | Freq: Four times a day (QID) | INTRAMUSCULAR | Status: DC | PRN
  Administered 2020-10-06: 2 mg via INTRAVENOUS
  Filled 2020-10-06: qty 1

## 2020-10-06 MED ORDER — POTASSIUM CHLORIDE 20 MEQ PO PACK
40.0000 meq | PACK | Freq: Once | ORAL | Status: AC
  Administered 2020-10-06: 40 meq
  Filled 2020-10-06: qty 2

## 2020-10-06 NOTE — Progress Notes (Signed)
Nutrition Follow Up  DOCUMENTATION CODES:   Not applicable  INTERVENTION:   Discussed change of radiology order from PEG to PEG-J given vomiting episodes. Plan for early next week.   Continue tube feeding:  -Pivot 1.5 @ 70 ml/hr (1680 ml) via Cortrak -ProSource TF 45 ml QID -Free water flushes 200 ml Q6 hours   Provides: 2680 kcal, 202 grams protein, 1260 ml free water (2060 ml with flushes)  NUTRITION DIAGNOSIS:   Increased nutrient needs related to acute illness (COVID PNA) as evidenced by estimated needs   Ongoing  GOAL:   Patient will meet greater than or equal to 90% of their needs   Addressed via TF  MONITOR:   Vent status,Skin,Weight trends,Labs,I & O's,TF tolerance  REASON FOR ASSESSMENT:   Ventilator,Consult Enteral/tube feeding initiation and management  ASSESSMENT:   Patient with PMH significant for asthma, cancer, CHF, DM, HTN, sickle cell anemia, renal insufficiency, and hyperlipidemia. Presents this admission with severe COVID ARDS.   12/04- admit [facility]6- hypoxia, intubated 12/07- transfer to [facility] for ECMO 12/08- VV ECMO cannulation, post pyloric Cortrak placed 12/09- extubated, re-intubated  12/13- circuit change 12/15- started PLEX 12/16- trach 12/27- started CRRT  12/31- vomited, Cortrak dislodged, NG placed 01/01- CRRT stopped, trickle TF restarted  01/05- post pyloric Cortrak placed   Pt discussed during ICU rounds and with RN.   Periods of agitation continue.  CT showed resolution of SAH. Now hypernatremic, free water flushes added. CBGs improved. Tolerating Pivot 1.5 at goal rate. Plan for PEG-J next week.   Admission weight: 101.9 kg  Current weight: 96 kg (down from 101.8 kg on 1/4)  Patient requiring ventilator support via trach MV: 7.1 L/min Temp (24hrs), Avg:98.1 F (36.7 C), Min:97.9 F (36.6 C), Max:98.2 F (36.8 C)  UOP: 4235 ml x 24 hrs   Medications: colace, nutrisource BID, SS novolog, 5 mg reglan QID,  miralax Labs: Na 152 (H) CBG 110-162  Diet Order:   Diet Order    None      EDUCATION NEEDS:   Not appropriate for education at this time  Skin:  Skin Assessment: Skin Integrity Issues: Skin Integrity Issues:: Other (Comment) Other: skin tear- R ear, R arm device related wound  Last BM:  1/7  Height:   Ht Readings from Last 1 Encounters:  10/06/20 5\' 9"  (1.753 m)    Weight:   Wt Readings from Last 1 Encounters:  10/06/20 96 kg    Adjusted Body Weight:  84.5 kg   BMI:  Body mass index is 31.25 kg/m.  Estimated Nutritional Needs:   Kcal:  12/04/20 kcal  Protein:  160-205 grams  Fluid:  >/= 2 L/day  [id number] RD, LDN Clinical Nutrition Pager listed in AMION

## 2020-10-06 NOTE — Progress Notes (Signed)
ANTICOAGULATION CONSULT NOTE  Pharmacy Consult for bivalirudin Indication: ECMO + bilateral DVTs + [facility] 12/26   Recent Labs    10/05/20 0346 10/05/20 0636 10/05/20 1540 10/05/20 1547 10/05/20 1959 10/06/20 0317 10/06/20 0515  HGB 7.8*  7.5*   < > 9.0*   < > 9.2* 8.6* 8.8*  HCT 23.9*  22.0*   < > 28.7*   < > 27.0* 26.7* 26.0*  PLT 176  --  195  --   --  183  --   APTT 52*  --  56*  --   --  53*  --   LABPROT  --   --   --   --   --  17.5*  --   INR  --   --   --   --   --  1.5*  --   CREATININE 1.14  --  1.11  --   --  1.13  --    < > = values in this interval not displayed.    Estimated Creatinine Clearance: 80.4 mL/min (by C-G formula based on SCr of 1.13 mg/dL).   Assessment: 35 yom unvaccinated presenting with severe COVID ARDs, intubated on 12/6, continued to have ventilation issues despite NMB and proning - started on VV ECMO on 12/8. Pt noted to have bilateral DVTs 12/8. Bivalirudin started for ECMO anticoagulation. Head CT 12/26 with new small SAH, repeat 12/27 stable without expansion. Repeat heat CT 1/5 SAH improved.   APTT is therapeutic at 53, on bivalirudin@0 .04 mg/kg/hr. CBC stable. No bleeding noted, LDH stable 500s, fibrinogen stable 300s  Goal of Therapy:  aPTT 50-70 seconds  Monitor platelets by anticoagulation protocol: Yes   Plan:  Continue bivalirudin at 0.04 mg/kg/hr Recheck aPTTs every 5a & 5p with ECMO labs.  Leota Sauers Pharm.D. CPP, BCPS Clinical Pharmacist [phone] 10/06/2020 8:29 AM    Please check AMION for all [facility] numbers 10/06/2020

## 2020-10-06 NOTE — Progress Notes (Signed)
   Palliative Medicine Inpatient Follow Up Note  Reason for consult:  "ECMO"  HPI:  Per intake H&P --> Murlin Hughartis a 59 y.o.malewith a pertinent history ofhypothyroidism on levothyroxine and hyperlipidemia who was diagnosed with Covid 1 week ago and is not vaccinated. With EMS he was seen to be desaturating to 78% on room air. He states he started having symptoms on Friday and tested positive on Saturday.  Had initially elected to be DNAR without intubation though later changed this and got intubated on 12/6  in the setting of worsening respiratory failure with hypoxia. CTA chest w/ diffuse bilateral infiltrates. On 12/8 was cannulated for VV ECMO. Had been extubated on 12/9 then emergently reintubated in early evening due to clinical instability.   Palliative care was asked to get involved in the setting of serious illness/ ECMO to set goals and expectations   Today's Discussion (10/06/2020): Chart reviewed.  Attended ECMO rounds this morning.  Patient continues to have complicated ICU associated delirium. Some changes were made to clonazepam dosing frequency, haldol was added back. SAH remains stable (CT head 1/5). On BS abx while awaiting cultures from bronchoscopy (completed on 1/5). Labile HTN continues though cardiology and Intensivist are carefully balancing medications in regards to this. Ongoing discussions related to PEG placement amongst specialists.   Patients wife, [name] updated over the phone. [name] shares that her largest frustrations is related to the polypharmacy associated with trying to control Jarell's agitated delirium. She states that she is trying to think about this matter simplistically and her feelings are that less is more regarding sedation. We discussed the fine balance the teams are trying to walk as it pertains to the sedatives and Stevin's agitated state. Support provided regarding these frustrations.   Questions and concerns addressed  SUMMARY OF  RECOMMENDATIONS Full Code/Full Scope of Care  ECMO as a bridge to recovery  Spiritual support appreciated  Ongoing PMT support    Time Spent: 25 Greater than 50% of the time was spent in counseling and coordination of care ______________________________________________________________________________________ [name], NP [facility] Palliative Medicine Team Team Cell Phone: [phone] Please utilize secure chat with additional questions, if there is no response within 30 minutes please call the above phone number  Palliative Medicine Team providers are available by phone from 7am to 7pm daily and can be reached through the team cell phone.  Should this patient require assistance outside of these hours, please call the patient's attending physician.

## 2020-10-06 NOTE — Progress Notes (Signed)
ANTICOAGULATION CONSULT NOTE  Pharmacy Consult for bivalirudin Indication: ECMO + bilateral DVTs + [facility] 12/26   Recent Labs    10/05/20 1540 10/05/20 1547 10/06/20 0317 10/06/20 0515 10/06/20 1031 10/06/20 1613 10/06/20 1615  HGB 9.0*   < > 8.6*   < > 8.2* 8.0* 7.5*  HCT 28.7*   < > 26.7*   < > 24.0* 26.5* 22.0*  PLT 195  --  183  --   --  180  --   APTT 56*  --  53*  --   --  62*  --   LABPROT  --   --  17.5*  --   --   --   --   INR  --   --  1.5*  --   --   --   --   CREATININE 1.11  --  1.13  --   --  1.11  --    < > = values in this interval not displayed.    Estimated Creatinine Clearance: 81.9 mL/min (by C-G formula based on SCr of 1.11 mg/dL).   Assessment: 54 yom unvaccinated presenting with severe COVID ARDs, intubated on 12/6, continued to have ventilation issues despite NMB and proning - started on VV ECMO on 12/8. Pt noted to have bilateral DVTs 12/8. Bivalirudin started for ECMO anticoagulation. Head CT 12/26 with new small SAH, repeat 12/27 stable without expansion. Repeat heat CT 1/5 SAH improved.   APTT is this evening remains therapeutic at 62, on bivalirudin@0 .04 mg/kg/hr. Hgb down to 7.5 << 8.2 this evening. No visible/obvious bleeding per discussion w/ RN - will monitor. LDH/fibronegen stable w/ AM labs.   Goal of Therapy:  aPTT 50-70 seconds  Monitor platelets by anticoagulation protocol: Yes   Plan:  Continue bivalirudin at 0.04 mg/kg/hr Recheck aPTTs every 5a & 5p with ECMO labs.  Thank you for allowing pharmacy to be a part of this patient's care.  Georgina Pillion, PharmD, BCPS Clinical Pharmacist Clinical phone for 10/06/2020: [phone] 10/06/2020 5:58 PM   **Pharmacist phone directory can now be found on [email or web address] ([email or web address]).  Listed under [facility].

## 2020-10-06 NOTE — Plan of Care (Signed)
  Problem: Education: Goal: Knowledge of risk factors and measures for prevention of condition will improve Outcome: Progressing   Problem: Coping: Goal: Psychosocial and spiritual needs will be supported Outcome: Progressing   Problem: Respiratory: Goal: Will maintain a patent airway Outcome: Progressing Goal: Complications related to the disease process, condition or treatment will be avoided or minimized Outcome: Progressing   Problem: Fluid Volume: Goal: Hemodynamic stability will improve Outcome: Progressing   Problem: Clinical Measurements: Goal: Diagnostic test results will improve Outcome: Progressing Goal: Signs and symptoms of infection will decrease Outcome: Progressing   Problem: Respiratory: Goal: Ability to maintain adequate ventilation will improve Outcome: Progressing   Problem: Education: Goal: Knowledge of General Education information will improve Description: Including pain rating scale, medication(s)/side effects and non-pharmacologic comfort measures Outcome: Progressing   Problem: Health Behavior/Discharge Planning: Goal: Ability to manage health-related needs will improve Outcome: Progressing   Problem: Clinical Measurements: Goal: Ability to maintain clinical measurements within normal limits will improve Outcome: Progressing Goal: Will remain free from infection Outcome: Progressing Goal: Diagnostic test results will improve Outcome: Progressing Goal: Respiratory complications will improve Outcome: Progressing Goal: Cardiovascular complication will be avoided Outcome: Progressing   Problem: Activity: Goal: Risk for activity intolerance will decrease Outcome: Progressing   Problem: Nutrition: Goal: Adequate nutrition will be maintained Outcome: Progressing   Problem: Coping: Goal: Level of anxiety will decrease Outcome: Progressing   Problem: Elimination: Goal: Will not experience complications related to bowel motility Outcome:  Progressing Goal: Will not experience complications related to urinary retention Outcome: Progressing   Problem: Pain Managment: Goal: General experience of comfort will improve Outcome: Progressing   Problem: Safety: Goal: Ability to remain free from injury will improve Outcome: Progressing   Problem: Skin Integrity: Goal: Risk for impaired skin integrity will decrease Outcome: Progressing   Problem: Activity: Goal: Ability to tolerate increased activity will improve Outcome: Progressing   Problem: Respiratory: Goal: Ability to maintain a clear airway and adequate ventilation will improve Outcome: Progressing   Problem: Role Relationship: Goal: Method of communication will improve Outcome: Progressing   Problem: Safety: Goal: Non-violent Restraint(s) Outcome: Progressing   

## 2020-10-06 NOTE — Procedures (Signed)
Extracorporeal support note     ECLS cannulation: 09/05/2020 Last circuit change: 09/11/2020  Indication: COVID ARDS   Configuration: VV, RIJ 32Fr [place]   Pump speed: 3400 Pump flow: 4.7 L Pump used: Cardiohelp   Sweep gas: 100%, 7 LPM   Circuit check: Clot only in corners of oxygenator-minimal Anticoagulant: bivalirudin   Changes in support: Con't aggressive care. Con't antibiotics. PTT goal 50-70. Minimize IV infusions, con't with scheduled and PRN agitaiton meds. Needs PEG tube given prolonged course of critical illness expected- will revisit with IR next week.  anticipated goals/duration of support: Bridge to recovery, day 30 of support.  Steffanie Dunn, DO 10/06/20 9:06 AM  Pulmonary & Critical Care

## 2020-10-06 NOTE — Progress Notes (Signed)
Patient ID: Donald Rowe, male   DOB: 09/09/1961, 60 y.o.   MRN: [id number]    Advanced Heart Failure Rounding Note   Subjective:    12/8 Cannulated for VV ECMO - 32 FR  RIJ Crescent 12/9 Extubated/Reintubated for altered mental status 12/13 Circuit change for elevated LDH 12/15 PLEX started 12/16 Tracheostomy 12/17 MRSA in sputum (negative cx 12/26) 12/23 head CT (normal) for change in mental status.  12/26 Pan CT for drop in hgb. Small SAH. Severe lung disease 12/27 CVVH begun 1/1 CVVH stopped 1/3 Foley placed for urinary retention 1/5  CT head and chest - resolution of SAH. Severe bilateral airspace disease, slightly improved. Pneumomediastinum resolved.   Continues to be agitated. Requires sedation. Dropped BP again overnight.   Bronch 1/5. + purulent secretions in LLL. Vanc/cefepime restarted. GS ++ WBCs. Few GPCs  Getting lasix daily. Weight down 13 pounds over last 3 days.   CXR: diffuse infiltrates but slightly improved.  +LLL infiltrate  ECMO   Speed 3400 Flow 4.69 Sweep 8 -> 7 dP 25 pVen -73  ABG: 7.29/69/73/92% Hgb 8.6 LDH 518 -> 488 -> 514 -> 498 -> 539 -> 542 -> 525 -> 516 Lactic acid 0.7 PTT 53  Vent 50%  TV ~200-300cc  Objective:   Weight Range:  Vital Signs:   Temp:  [97.9 F (36.6 C)-98.9 F (37.2 C)] 98.1 F (36.7 C) (01/07 0400) Pulse Rate:  [93-131] 99 (01/07 0715) Resp:  [10-50] 23 (01/07 0715) BP: (91-174)/(53-89) 174/64 (01/07 0551) SpO2:  [90 %-100 %] 96 % (01/07 0715) Arterial Line BP: (77-190)/(39-78) 151/57 (01/07 0715) FiO2 (%):  [50 %] 50 % (01/07 0747) Weight:  [96 kg] 96 kg (01/07 0556) Last BM Date: 10/05/20  Weight change: Filed Weights   10/04/20 0500 10/05/20 0500 10/06/20 0556  Weight: 99.3 kg 98 kg 96 kg    Intake/Output:   Intake/Output Summary (Last 24 hours) at 10/06/2020 0822 Last data filed at 10/06/2020 0700 Gross per 24 hour  Intake 4014.81 ml  Output 3960 ml  Net 54.81 ml     Physical  Exam: General:  Awake. Agitated. Will withdraw to noxious stimuli. Not following commands.  HEENT: normal + cor-trak Neck: supple. RIJ . + trach Cor: PMI nondisplaced. Regular tachy Lungs: coarse Abdomen: soft, nontender, nondistended. No hepatosplenomegaly. No bruits or masses. Good bowel sounds. Extremities: no cyanosis, clubbing, rash, edema + Foley Neuro: as above  Telemetry: sinus 110-120s Personally reviewed    Labs: Basic Metabolic Panel: Recent Labs  Lab 10/02/20 0401 10/02/20 0411 10/03/20 0320 10/03/20 1133 10/04/20 0403 10/04/20 1623 10/04/20 1647 10/05/20 0346 10/05/20 0636 10/05/20 1540 10/05/20 1547 10/05/20 1959 10/06/20 0317 10/06/20 0515  NA 138   < > 140  140   < > 141 143   < > 144  148*   < > 148* 149* 149* 148* 151*  K 4.2   < > 4.6  4.7   < > 3.8 4.9   < > 4.8  4.3   < > 4.0 3.9 3.8 3.6 4.4  CL 104   < > 106   < > 111 109  --  108  --  109  --   --  111  --   CO2 24   < > 26   < > 24 29  --  29  --  30  --   --  28  --   GLUCOSE 99   < > 111*   < > 102*  114*  --  133*  --  150*  --   --  162*  --   BUN 70*   < > 70*   < > 53* 55*  --  63*  --  60*  --   --  64*  --   CREATININE 1.34*   < > 1.28*   < > 0.99 1.16  --  1.14  --  1.11  --   --  1.13  --   CALCIUM 9.0   < > 8.9   < > 7.7* 8.9  --  8.9  --  9.0  --   --  9.0  --   MG 2.8*  --  2.5*  --  2.0  --   --  2.2  --   --   --   --  2.4  --   PHOS 6.4*   < > 5.5*   < > 3.2 4.5  --  2.8  --  3.7  --   --  3.4  --    < > = values in this interval not displayed.    Liver Function Tests: Recent Labs  Lab 10/02/20 0401 10/02/20 1600 10/04/20 0403 10/04/20 1623 10/05/20 0346 10/05/20 1540 10/06/20 0317  AST 48*  --   --   --   --   --   --   ALT 46*  --   --   --   --   --   --   ALKPHOS 141*  --   --   --   --   --   --   BILITOT 1.1  --   --   --   --   --   --   PROT 5.9*  --   --   --   --   --   --   ALBUMIN 3.0*  3.0*   < > 2.3* 2.7* 2.6* 2.6* 2.4*   < > = values in this  interval not displayed.   No results for input(s): LIPASE, AMYLASE in the last 168 hours. No results for input(s): AMMONIA in the last 168 hours.  CBC: Recent Labs  Lab 10/04/20 0403 10/04/20 1623 10/04/20 1647 10/05/20 0346 10/05/20 0636 10/05/20 1540 10/05/20 1547 10/05/20 1959 10/06/20 0317 10/06/20 0515  WBC 7.5 10.3  --  7.4  --  7.8  --   --  6.9  --   HGB 7.8* 8.3*   < > 7.8*  7.5*   < > 9.0* 8.8* 9.2* 8.6* 8.8*  HCT 23.1* 27.1*   < > 23.9*  22.0*   < > 28.7* 26.0* 27.0* 26.7* 26.0*  MCV 96.3 100.4*  --  96.8  --  98.0  --   --  98.9  --   PLT 144* 174  --  176  --  195  --   --  183  --    < > = values in this interval not displayed.    Cardiac Enzymes: No results for input(s): CKTOTAL, CKMB, CKMBINDEX, TROPONINI in the last 168 hours.  BNP: BNP (last 3 results) No results for input(s): BNP in the last 8760 hours.  ProBNP (last 3 results) No results for input(s): PROBNP in the last 8760 hours.    Other results:  Imaging: CT HEAD WO CONTRAST  Result Date: 10/04/2020 CLINICAL DATA:  Subarachnoid hemorrhage. EXAM: CT HEAD WITHOUT CONTRAST TECHNIQUE: Contiguous axial images were obtained from the base of the skull through  the vertex without intravenous contrast. COMPARISON:  September 25, 2020. FINDINGS: Brain: No evidence of acute infarction, hemorrhage, hydrocephalus, extra-axial collection or mass lesion/mass effect. Subarachnoid hemorrhage noted on prior exam is not visualized currently. Vascular: No hyperdense vessel or unexpected calcification. Skull: Normal. Negative for fracture or focal lesion. Sinuses/Orbits: No acute finding. Other: Fluid is noted in mastoid air cells bilaterally. IMPRESSION: Subarachnoid hemorrhage noted on prior exam is not visualized currently. No acute intracranial abnormality seen. Electronically Signed   By: Marijo Conception M.D.   On: 10/04/2020 10:03   CT CHEST WO CONTRAST  Result Date: 10/04/2020 CLINICAL DATA:  History of COVID-19  positivity with respiratory distress EXAM: CT CHEST WITHOUT CONTRAST TECHNIQUE: Multidetector CT imaging of the chest was performed following the standard protocol without IV contrast. COMPARISON:  CT from 09/24/2020, chest x-ray from earlier in the same day. FINDINGS: Cardiovascular: Thoracic aorta is well visualize without aneurysmal dilatation. No significant cardiac enlargement is noted. Relative decreased attenuation of the cardiac blood pool is noted suggestive of underlying anemia. No dilatation of the pulmonary artery is seen. ECMO cannula is noted on the right extending into the IVC. Coronary calcifications are noted. Left-sided PICC line is noted in place. Mediastinum/Nodes: Thoracic inlet is within normal limits. Tracheostomy tube is noted in place. Gastric catheter extends into the stomach. No sizable hilar or mediastinal adenopathy is noted. The esophagus as visualized is within normal limits. Previously seen pneumomediastinum has resolved in the interval. Lungs/Pleura: Diffuse bilateral airspace opacity is again identified but slightly improved when compared with the prior exam. No sizable effusions are noted. Upper Abdomen: Visualized upper abdomen shows no acute abnormality. Musculoskeletal: No chest wall mass or suspicious bone lesions identified. IMPRESSION: Diffuse airspace opacities and edema consistent with the known history of ARDS and COVID-19 pneumonia. Improved aeration is noted when compared with the prior CT. Resolution of previously seen pneumomediastinum. Tubes and lines as described above. Electronically Signed   By: Inez Catalina M.D.   On: 10/04/2020 10:10   DG CHEST PORT 1 VIEW  Result Date: 10/06/2020 CLINICAL DATA:  ECMO.  COVID. EXAM: PORTABLE CHEST 1 VIEW COMPARISON:  Yesterday FINDINGS: Confluent airspace disease. ECMO catheter in stable position. Tracheostomy tube in place. The enteric tube at least reaches the stomach. Left subclavian line with tip at the SVC. No air leak.  Normal heart size. IMPRESSION: Stable hardware positioning and confluent pneumonia. Electronically Signed   By: Monte Fantasia M.D.   On: 10/06/2020 06:50   DG CHEST PORT 1 VIEW  Result Date: 10/05/2020 CLINICAL DATA:  Tracheostomy.  ECMO.  Recent COVID. EXAM: PORTABLE CHEST 1 VIEW COMPARISON:  CT 10/04/2020.  Chest x-ray 10/04/2020. FINDINGS: Interim removal of NG tube and placement of feeding tube. Tracheostomy tube, left PICC line, ECMO catheter stable position. Stable cardiomegaly. Diffuse severe bilateral pulmonary infiltrates again noted without interim change. No pleural effusion or pneumothorax. IMPRESSION: 1. Interim removal of NG tube and placement of feeding tube. 2. Remaining lines and tubes stable. 3. Diffuse severe bilateral pulmonary infiltrates again noted without interim change. Electronically Signed   By: Marcello Moores  Register   On: 10/05/2020 06:29   DG Abd Portable 1V  Result Date: 10/04/2020 CLINICAL DATA:  Nasogastric tube placement EXAM: PORTABLE ABDOMEN - 1 VIEW COMPARISON:  09/30/2020 FINDINGS: Nasogastric tube with the tip projecting over the expected location of the proximal jejunum. No bowel dilatation to suggest obstruction. No evidence of pneumoperitoneum, portal venous gas or pneumatosis. No pathologic calcifications along the expected course  of the ureters. No acute osseous abnormality. IMPRESSION: Nasogastric tube with the tip projecting over the expected location of the proximal jejunum. Electronically Signed   By: Kathreen Devoid   On: 10/04/2020 11:14     Medications:     Scheduled Medications: . amLODipine  10 mg Per Tube QHS  . chlorhexidine gluconate (MEDLINE KIT)  15 mL Mouth Rinse BID  . Chlorhexidine Gluconate Cloth  6 each Topical Daily  . clonazePAM  0.5 mg Per Tube Q8H  . cloNIDine  0.1 mg Per Tube TID  . docusate  100 mg Per Tube BID  . feeding supplement (PROSource TF)  45 mL Per Tube QID  . fiber  1 packet Per Tube BID  . insulin aspart  2-6 Units  Subcutaneous Q4H  . levothyroxine  50 mcg Per Tube Q0600  . mouth rinse  15 mL Mouth Rinse 10 times per day  . metoCLOPramide (REGLAN) injection  10 mg Intravenous Q6H  . metoprolol tartrate  50 mg Per Tube Q12H  . mupirocin ointment  1 application Nasal BID  . nystatin  5 mL Per Tube QID  . oxyCODONE  15 mg Per Tube Q6H  . pantoprazole sodium  40 mg Per Tube BID  . polyethylene glycol  17 g Per Tube BID  . pravastatin  40 mg Per Tube q1800  . QUEtiapine  150 mg Per Tube TID  . sodium chloride flush  10-40 mL Intracatheter Q12H    Infusions: . sodium chloride Stopped (10/06/20 0654)  . albumin human 12.5 g (09/29/20 1644)  . bivalirudin (ANGIOMAX) infusion 0.5 mg/mL (Non-ACS indications) 0.04 mg/kg/hr (10/06/20 0700)  . ceFEPime (MAXIPIME) IV Stopped (10/06/20 2979)  . dexmedetomidine (PRECEDEX) IV infusion Stopped (10/05/20 1549)  . feeding supplement (PIVOT 1.5 CAL) 1,000 mL (10/05/20 1815)  . fentaNYL infusion INTRAVENOUS 50 mcg/hr (10/06/20 0700)  . niCARDipine Stopped (09/30/20 8921)  . vancomycin Stopped (10/05/20 1908)    PRN Medications: acetaminophen (TYLENOL) oral liquid 160 mg/5 mL, albumin human, albuterol, diphenhydrAMINE, fentaNYL, Gerhardt's butt cream, guaiFENesin-dextromethorphan, haloperidol lactate, labetalol, lip balm, LORazepam, midazolam, ondansetron **OR** ondansetron (ZOFRAN) IV, phenylephrine, sodium chloride flush   Assessment/Plan:   1. Acute hypoxic respiratory failure/ARDS in setting of COVID-19 PNA - Cannulated for VV ECMO on 12/8 after failing intubation x 2 days - has complete remedisivir, baricitinib, Solumedrol.  - s/p trach 12/16 - CT chest 12/26 with severe lung disease and pneumomediastinum.  - 12/17 MRSA in sputum (negative cx 12/26) - Restarted vanc/cefepime 12/26 => completed course.  - Increasing secretions with. Bronch with evidence LLL PNA 1/5 - Vanc/cefepime restarted 1/5. Await BAL cultures - Circuit changed 12/13 for elevated LDH  and lower flows. PLEX started 12/15. S/p PLEX #4/4 on 12/21. LDH has been stable. Circuit parameters stable - CVVH started 12/27 with intractable volume overload, stopped 1/1.  Now respoding well to IV lasix. Weight down. Creatinine stable. Will hold lasix today - Bivalirudin with PTT goal back up to 50-70. PTT 53 today. Discussed dosing with PharmD personally. -  CT head and chest 10/04/20 with resolution of SAH. Still with severe bilateral airspace disease, slightly improved. Pneumomediastinum has resolved.  - CT findings a bit reassuring but still with a long way to go. Continue to wean sweep and try to get sedation down. Encephalopathy remains a major barrier to progression as well   2. Bilateral DVT - remains on bivalirudin - Discussed dosing with PharmD personally.  3. DM2 - Insulin adjusted. CBGs improved  4. F/E/N - Had ileus over weekend. Reglan back on. Now tolerating full TFs - IR consulted for PEG feeding tube  6. HTN - Remains very labile  - Meds adjusted again. Discussed dosing with PharmD personally.  7. Rectal bleeding - much improved  8. AKI - Now off CVVH, Creatinine 1.13 today  9. Acute encephalopathy - will respond to pain and occasionally follow simple commands. Full assessment limited by severe agitation and need for sedation to protect circuit. No changed. Unable to participate with PT - head CT ok 09/07/20 and 12/21 - head CT 12/26. Small SAH.  Repeat CT on 12/27 with unchanged small SAH.  - head CT 10/04/20 SAH resolved  10. Small SAH - on CT 12/26 - repeat CT 12/27 unchanged small SAH.  - PTT goal reduced 40-50 for several days, plan to go back to 50-70 today.  - avoid severe HTN as best as possible - head CT 10/04/20 SAH resolved  11. Gastric ileus -  improved with Reglan. TFs.  - Consulter IR for PEG  12. Urinary retention  - Foley placed 10/03/20. Continue for now  13. Anemia - Transfuse hgb < 8.  - Has been getting periodic blood without overt  GI bleeding. No change  Plan discussed on multi-discplinary ECMO rounds with CCM, ECMO coordinator/specialist, PharmDs and RNs.  CRITICAL CARE Performed by: Glori Bickers  Total critical care time: 40 minutes  Critical care time was exclusive of separately billable procedures and treating other patients.  Critical care was necessary to treat or prevent imminent or life-threatening deterioration.  Critical care was time spent personally by me (independent of midlevel providers or residents) on the following activities: development of treatment plan with patient and/or surrogate as well as nursing, discussions with consultants, evaluation of patient's response to treatment, examination of patient, obtaining history from patient or surrogate, ordering and performing treatments and interventions, ordering and review of laboratory studies, ordering and review of radiographic studies, pulse oximetry and re-evaluation of patient's condition.    Length of Stay: 11   Glori Bickers MD 10/06/2020, 8:22 AM  Advanced Heart Failure Team Pager [phone] (M-F; [place])  Please contact [facility] for night-coverage after hours (4p -7a ) and weekends on [email or web address]

## 2020-10-06 NOTE — Progress Notes (Signed)
NAME:  Donald Rowe, MRN:  [id number], DOB:  07-06-1961, LOS: 52 ADMISSION DATE:  09/03/2020, CONSULTATION DATE:  12/6 REFERRING MD:  Dr. Aileen Fass, CHIEF COMPLAINT:  SOB    Brief History   60 y/o M admitted 12/4 with 1 week hx of SOB, known COVID positive.  He is unvaccinated.  CTA chest negative for PE but demonstrated diffuse bilateral infiltrates. [place] started on 12/8   Past Medical History  Hypothyroidism  [facility] Events/Procedures  12/04 Admit  12/06 PCCM consulted  12/07 Intubated, central line, a line 12/08 VV ECMO Cannulaton, cortrak 12/13 circuit change for hemolytic anemia 12/16 Tracheostomy  12/17 bronch w/ BAL  Consults:  PCCM, Heart failure, TCTS, ECMO  Significant Diagnostic Tests:   CTA Chest 12/4 >> extensive bilateral airspace disease, no large PE identified, limited study   LE Venous Duplex 12/4 >> negative for DVT bilaterally   LE venous duplex 12/8> BLE DVTs  RUE venous duplex 12/23  RUE arterial duplex 12/23  CT head 12/26 : Minimal SAH on left side  CT abdomen and pelvis 12/26: Extensive bilateral airspace disease with small pleural effusion  CT head 12/27: Subarachnoid unchanged  Chest x-ray 1/1: Significant improvement in airspace disease  Micro Data:  COVID 12/4 >> negative  Influenza A/B 12/4 >> negative  MRSA PCR 12/5 >> negative  BCx2 12/4 >> NG Trach aspirate 12/12> rare MRSA BAL 12/17> MRSA, candida Aspergillus Ag BAL 12/17> 0.05 12/18 sars2: positive  Antimicrobials:  Azithromycin 12/5 >> 12/6  Ceftriaxone 12/5 >> 12/6  Cefepime 12/7>12/14 vanc 12/14>12/20 Meropenem 12/17>12/21  Interim history/subjective:  Still having periods of agitation. Hypotensive after klonopin overnight.  Objective   Blood pressure (!) 174/64, pulse (!) 112, temperature 98.1 F (36.7 C), temperature source Core, resp. rate (!) 48, height _0  (1.753 m), weight 96 kg, SpO2 96 %.    Vent Mode: PCV FiO2 (%):  [50 %] 50  % Set Rate:  [10 bmp] 10 bmp PEEP:  [10 cmH20] 10 cmH20 Plateau Pressure:  [18 cmH20-23 cmH20] 18 cmH20   Intake/Output Summary (Last 24 hours) at 10/06/2020 0704 Last data filed at 10/06/2020 0700 Gross per 24 hour  Intake 4195.45 ml  Output 4235 ml  Net -39.55 ml   Filed Weights   10/04/20 0500 10/05/20 0500 10/06/20 0556  Weight: 99.3 kg 98 kg 96 kg    Examination: Gen: critically ill appearing man laying in bed in NAD, on ECMO & MV HEENT: [place]/AT, eyes anicteric, oral mucosa moist. Neck: trach w/o secretions, RIJ ECMO cannula CV: tachycardic, reg rhythm Pulm: bilateral rhales, improved. Ct ~200cc. Very tachypneic but no increased WOB. Abd: soft, NT, ND Extm: improving dependent edema, no clubbing or cyanosis Skin: warm, dry. Pallor, no ecchymoses. Neuro: RASS -1, sometimes head bobbing and moving extremities nonpurposefully but falls back asleep. Sometimes opens eyes to voice, looks around but not tracking. Moves all extremities.  CXR personally reviewed> improving R opacities, LUL opacities.   Resolved Hospital Problem list     Assessment & Plan:   Acute hypoxemic and hypercarbic respiratory failure secondary to COVID PNA with severe ARDS. S/p VV ECMO cannulation 09/14/2020.  S/p tracheostomy 82/80 complicated by trach site bleeding improved Trivial left-sided subarachnoid hemorrhage Community-acquired pneumonia with MRSA- completed treatment. LLL HAP -Completed remdesivir, solumedrol, and baricitnib. -Con't full ECMO support, weaning sweep as tolerated. -Completed for treatment with 4 sessions of Plex for hemolysis -Furosemide once daily for goal euvolemia  -Completed 7 days vanc for MRSA  pneumonia. Con't empiric antibiotics while awaiting bronch cultures for LLL  Agitated delirium requiring titration of IV sedation.  Periods of severe agitation are limiting ability to wean ECMO -minimize infusions -Con't enteral seroquel, klonopin- decreasing dose to 0.2m  TID -delirium remains main limitation to rehabilitation at this point  Bilateral lower ext DVTs- provoked by COVID infection -Continue bivalirudin; goal PTT 50-70 -needs 3 months AC for provoked DVT   Steroid-induced hyperglycemia- controlled  -SSI PRN -goal BG 140-180  Hypothyroidism -con't PTA synthroid   Labile hypertension, contributed by periods of agitation.  Hypotension after meds. Not needing pressors or cardene at this point. -space out sedating meds and antihypertensives -con't clonidine, amlodipine, and metoprolol -agitation control helps moderate BP  Hemolytic anemia - improved. More expected down trending of Hb with expected losses associated with ECMO Throbocytopenia Hyperbilirubinemia Rheumatologic-associated MAHA remains in differential.  hgb and plt are stable -Completed treatment with the Plex for 4 sessions -Transfuse if hgb <8  Vomiting- resolved -con't reglan; decrease to [id number] -TF at goal -ok to proceed with PEG at any point  [facility] in [facility]> resolved on follow up imaging -PTT goals 50-70  Deconditioning -long road of rehab ahead, not able to participate in PT, OT, SLP yet due to delirium -ok for PEG tube   Best practice (evaluated daily)  Diet: TF Pain/Anxiety/Delirium protocol (if indicated): PAD protocol, limit IV meds VAP protocol (if indicated): yes DVT prophylaxis: Bivalirudin GI prophylaxis: pantoprazole BID Glucose control: SSI Mobility: As tolerated Last date of multidisciplinary goals of care discussion: will update wife this afternoon Summary of discussion con't aggressive care Follow up goals of care discussion due:  Code Status: Full Disposition: ICU  This patient is critically ill with multiple organ system failure which requires frequent high complexity decision making, assessment, support, evaluation, and titration of therapies. This was completed through the application of advanced monitoring  technologies and extensive interpretation of multiple databases. During this encounter critical care time was devoted to patient care services described in this note for 50 minutes. Multidisciplinary ECMO rounds completed.   LaJulian HyDO 10/06/20 9:21 AM [facility]

## 2020-10-06 NOTE — Progress Notes (Signed)
Occupational Therapy Treatment Patient Details Name: Donald Rowe MRN: [id number] DOB: 1961-03-14 Today's Date: 10/06/2020    History of present illness 60 y.o. male with a pertinent history of hypothyroidism on levothyroxine and hyperlipidemia who was diagnosed with Covid 11/27 and is not vaccinated.  With EMS SaO2 on RA 78%O2. ED patient was increased to 6 L oxygen satting about 92%,. CTA of the Chest was assessed and negative for pulmonary embolism but showed diffuse bilateral infiltrates. He was treated with IV remdesivir, steroids and baricitinib.  Initially, he was also treated with antibiotics but these were stopped on 12/6 with a low procalcitonin. 12/07 intubated, central line, A line, 12/08 ECMO cannulation, cortrak, attempted extubation 12/9 failed reintubated. Trach placed 12/16. CCRT started 12/26.   OT comments  Patient able to open his eyes this date, and follow probably 50% of commands.  Patient brought to sitting position at the side of the bed with PT/OT, ECMO specialist and RN.  Patient turned his head to command, kicked his legs to commands, and attempted to engage trunk muscles to maintain sit position.  He was able to sit at the edge for more than 10 minutes with external supports.  His eyes were open during the bulk of the treatment.  OT to continue in the acute setting, look to continue sitting tolerance.    Follow Up Recommendations  CIR    Equipment Recommendations  Other (comment)    Recommendations for Other Services      Precautions / Restrictions Precautions Precaution Comments: ECMO, cortrak, vent via trach, cath, CRRT via femoral catheter Restrictions Weight Bearing Restrictions: No Other Position/Activity Restrictions: wrist restraints.       Mobility Bed Mobility Overal bed mobility: Needs Assistance Bed Mobility: Supine to Sit;Sit to Supine     Supine to sit: Total assist;+2 for physical assistance;+2 for safety/equipment Sit to supine: Total  assist;+2 for physical assistance;+2 for safety/equipment   General bed mobility comments: patient able to sit EOB this date with PT/OT, ECMO specialist, and RN  Transfers                 General transfer comment: not attemtped    Balance   Sitting-balance support: No upper extremity supported;Feet supported Sitting balance-Leahy Scale: Zero Sitting balance - Comments: patient did initate trying to hold himself in sit on command - required external supports. Postural control: Posterior lean;Right lateral lean;Left lateral lean                                 ADL either performed or assessed with clinical judgement   ADL Overall ADL's : Needs assistance/impaired                                       General ADL Comments: total assist     Vision       Perception     Praxis      Cognition   Behavior During Therapy: Restless Overall Cognitive Status: Difficult to assess                                 General Comments: was following 50% of commands - move leg, turn head.  General Comments  vitals monitored throughout.     Pertinent Vitals/ Pain       Faces Pain Scale: Hurts a little bit Pain Location: generalized with limb movement Pain Descriptors / Indicators: Grimacing Pain Intervention(s): Monitored during session                                                          Frequency  Min 2X/week        Progress Toward Goals  OT Goals(current goals can now be found in the care plan section)  Progress towards OT goals: Progressing toward goals  Acute Rehab OT Goals OT Goal Formulation: Patient unable to participate in goal setting Time For Goal Achievement: 10/11/20 Potential to Achieve Goals: Fair  Plan Discharge plan remains appropriate    Co-evaluation    PT/OT/SLP Co-Evaluation/Treatment: Yes Reason for Co-Treatment: Complexity of the patient's  impairments (multi-system involvement);For patient/therapist safety   OT goals addressed during session: Other (comment) (sitting tolerance)      AM-PAC OT "6 Clicks" Daily Activity     Outcome Measure   Help from another person eating meals?: Total Help from another person taking care of personal grooming?: Total Help from another person toileting, which includes using toliet, bedpan, or urinal?: Total Help from another person bathing (including washing, rinsing, drying)?: Total Help from another person to put on and taking off regular upper body clothing?: Total Help from another person to put on and taking off regular lower body clothing?: Total 6 Click Score: 6    End of Session    OT Visit Diagnosis: Unsteadiness on feet (R26.81);Other abnormalities of gait and mobility (R26.89);Muscle weakness (generalized) (M62.81);Pain   Activity Tolerance Treatment limited secondary to medical complications (Comment)   Patient Left in bed;with call bell/phone within reach;with nursing/sitter in room   Nurse Communication          Time: 1030-1058 OT Time Calculation (min): 28 min  Charges: OT General Charges $OT Visit: 1 Visit OT Treatments $Therapeutic Activity: 8-22 mins  10/06/2020  [name], OTR/L  Acute Rehabilitation Services  Office:  [phone]    [name] 10/06/2020, 1:49 PM

## 2020-10-06 NOTE — Progress Notes (Signed)
Physical Therapy Treatment Patient Details Name: Donald Rowe MRN: [id number] DOB: Jun 11, 1961 Today's Date: 10/06/2020    History of Present Illness 60 y.o. male with a pertinent history of hypothyroidism on levothyroxine and hyperlipidemia who was diagnosed with Covid 11/27 and is not vaccinated.  With EMS SaO2 on RA 78%O2. ED patient was increased to 6 L oxygen satting about 92%,. CTA of the Chest was assessed and negative for pulmonary embolism but showed diffuse bilateral infiltrates. He was treated with IV remdesivir, steroids and baricitinib.  Initially, he was also treated with antibiotics but these were stopped on 12/6 with a low procalcitonin. 12/07 intubated, central line, A line, 12/08 ECMO cannulation, cortrak, attempted extubation 12/9 failed reintubated. Trach placed 12/16. CCRT started 12/26.    PT Comments    Pt admitted with above diagnosis. Pt was able to sit EOB 10 minutes with mod to max assist of 2 persons with Ecmo specialist watching lines and nurse present. Pt kicked right LE > left.  Pt also shook head yes and no 50% of time to questions.  Did not move UEs to command today. Pt did open eyes during treatment as well.  Pt currently with functional limitations due to balance and endurance deficits. Pt will benefit from skilled PT to increase their independence and safety with mobility to allow discharge to the venue listed below.     Follow Up Recommendations  CIR     Equipment Recommendations   (TBD)    Recommendations for Other Services       Precautions / Restrictions Precautions Precautions: Other (comment) Precaution Comments: ECMO, cortrak, vent via trach, cath Restrictions Weight Bearing Restrictions: No Other Position/Activity Restrictions: wrist restraints.    Mobility  Bed Mobility Overal bed mobility: Needs Assistance Bed Mobility: Supine to Sit;Sit to Supine     Supine to sit: Total assist;+2 for physical assistance;+2 for safety/equipment Sit to  supine: Total assist;+2 for physical assistance;+2 for safety/equipment   General bed mobility comments: patient able to sit EOB this date with PT/OT, ECMO specialist, and RN  Transfers                 General transfer comment: not attemtped  Ambulation/Gait                 Stairs             Wheelchair Mobility    Modified Rankin (Stroke Patients Only)       Balance Overall balance assessment: Needs assistance Sitting-balance support: No upper extremity supported;Feet supported Sitting balance-Leahy Scale: Zero Sitting balance - Comments: patient did initate trying to hold himself in sit on command - required external supports of mod to max assist of 2.  Also kicked LEs to command. Postural control: Posterior lean;Right lateral lean;Left lateral lean                                  Cognition Arousal/Alertness: Lethargic Behavior During Therapy: Restless;Impulsive Overall Cognitive Status: Difficult to assess                                 General Comments: was following 50% of commands - move leg, turn head.      Exercises General Exercises - Lower Extremity Long Arc Quad: AROM;5 reps;Seated;Right Hip Flexion/Marching: AROM;Both;5 reps;Seated Other Exercises Other Exercises: Pt with cervical rotation spontaneous while PT  in room    General Comments        Pertinent Vitals/Pain Pain Assessment: Faces Faces Pain Scale: Hurts whole lot Pain Location: generalized with limb movement Pain Descriptors / Indicators: Grimacing Pain Intervention(s): Limited activity within patient's tolerance;Monitored during session;Repositioned    Home Living                      Prior Function            PT Goals (current goals can now be found in the care plan section) Progress towards PT goals: Progressing toward goals    Frequency    Min 3X/week      PT Plan Frequency needs to be updated     Co-evaluation PT/OT/SLP Co-Evaluation/Treatment: Yes Reason for Co-Treatment: Complexity of the patient's impairments (multi-system involvement);For patient/therapist safety PT goals addressed during session: Mobility/safety with mobility OT goals addressed during session: Other (comment) (sitting tolerance)      AM-PAC PT "6 Clicks" Mobility   Outcome Measure  Help needed turning from your back to your side while in a flat bed without using bedrails?: Total Help needed moving from lying on your back to sitting on the side of a flat bed without using bedrails?: Total Help needed moving to and from a bed to a chair (including a wheelchair)?: Total Help needed standing up from a chair using your arms (e.g., wheelchair or bedside chair)?: Total Help needed to walk in hospital room?: Total Help needed climbing 3-5 steps with a railing? : Total 6 Click Score: 6    End of Session   Activity Tolerance: Patient limited by fatigue Patient left: with nursing/sitter in room;in bed;with call bell/phone within reach;with restraints reapplied Nurse Communication: Mobility status PT Visit Diagnosis: Muscle weakness (generalized) (M62.81);Difficulty in walking, not elsewhere classified (R26.2)     Time: 1030-1100 PT Time Calculation (min) (ACUTE ONLY): 30 min  Charges:  $Therapeutic Activity: 8-22 mins                     Siriah Treat W,PT Acute Rehabilitation Services Pager:  [phone]  Office:  [phone]     Berline Lopes 10/06/2020, 2:42 PM

## 2020-10-06 NOTE — Plan of Care (Signed)
Problem: Respiratory: Goal: Ability to maintain adequate ventilation will improve Outcome: Progressing   Problem: Clinical Measurements: Goal: Diagnostic test results will improve Outcome: Progressing   Problem: Activity: Goal: Risk for activity intolerance will decrease Outcome: Progressing Note: Sat on edge of bed with PT this morning. Tolerating chair position in bed.    Problem: Nutrition: Goal: Adequate nutrition will be maintained Outcome: Progressing Note: Tolerating TF without emesis.    Problem: Elimination: Goal: Will not experience complications related to bowel motility Outcome: Progressing   Problem: Skin Integrity: Goal: Risk for impaired skin integrity will decrease Outcome: Progressing   Problem: Safety: Goal: Non-violent Restraint(s) Outcome: Progressing Note: Has been without restraints since 10am. Will continue to evaluate patient for safety risks.     Problem: Pain Managment: Goal: General experience of comfort will improve Outcome: Not Progressing Note: Still with elevated BP, HR, RR.

## 2020-10-07 ENCOUNTER — Inpatient Hospital Stay (HOSPITAL_COMMUNITY): Payer: HRSA Program

## 2020-10-07 DIAGNOSIS — Z9281 Personal history of extracorporeal membrane oxygenation (ECMO): Secondary | ICD-10-CM | POA: Diagnosis not present

## 2020-10-07 DIAGNOSIS — J9602 Acute respiratory failure with hypercapnia: Secondary | ICD-10-CM | POA: Diagnosis not present

## 2020-10-07 DIAGNOSIS — Z7189 Other specified counseling: Secondary | ICD-10-CM | POA: Diagnosis not present

## 2020-10-07 DIAGNOSIS — J9601 Acute respiratory failure with hypoxia: Secondary | ICD-10-CM | POA: Diagnosis not present

## 2020-10-07 DIAGNOSIS — U071 COVID-19: Secondary | ICD-10-CM | POA: Diagnosis not present

## 2020-10-07 DIAGNOSIS — Z515 Encounter for palliative care: Secondary | ICD-10-CM | POA: Diagnosis not present

## 2020-10-07 DIAGNOSIS — Z9911 Dependence on respirator [ventilator] status: Secondary | ICD-10-CM | POA: Diagnosis not present

## 2020-10-07 LAB — POCT I-STAT 7, (LYTES, BLD GAS, ICA,H+H)
Acid-Base Excess: 1 mmol/L (ref 0.0–2.0)
Acid-Base Excess: 4 mmol/L — ABNORMAL HIGH (ref 0.0–2.0)
Acid-Base Excess: 4 mmol/L — ABNORMAL HIGH (ref 0.0–2.0)
Acid-Base Excess: 7 mmol/L — ABNORMAL HIGH (ref 0.0–2.0)
Acid-Base Excess: 7 mmol/L — ABNORMAL HIGH (ref 0.0–2.0)
Bicarbonate: 27.4 mmol/L (ref 20.0–28.0)
Bicarbonate: 30.4 mmol/L — ABNORMAL HIGH (ref 20.0–28.0)
Bicarbonate: 31.3 mmol/L — ABNORMAL HIGH (ref 20.0–28.0)
Bicarbonate: 33.2 mmol/L — ABNORMAL HIGH (ref 20.0–28.0)
Bicarbonate: 33.5 mmol/L — ABNORMAL HIGH (ref 20.0–28.0)
Calcium, Ion: 1.29 mmol/L (ref 1.15–1.40)
Calcium, Ion: 1.36 mmol/L (ref 1.15–1.40)
Calcium, Ion: 1.37 mmol/L (ref 1.15–1.40)
Calcium, Ion: 1.39 mmol/L (ref 1.15–1.40)
Calcium, Ion: 1.43 mmol/L — ABNORMAL HIGH (ref 1.15–1.40)
HCT: 19 % — ABNORMAL LOW (ref 39.0–52.0)
HCT: 23 % — ABNORMAL LOW (ref 39.0–52.0)
HCT: 24 % — ABNORMAL LOW (ref 39.0–52.0)
HCT: 25 % — ABNORMAL LOW (ref 39.0–52.0)
HCT: 26 % — ABNORMAL LOW (ref 39.0–52.0)
Hemoglobin: 6.5 g/dL — CL (ref 13.0–17.0)
Hemoglobin: 7.8 g/dL — ABNORMAL LOW (ref 13.0–17.0)
Hemoglobin: 8.2 g/dL — ABNORMAL LOW (ref 13.0–17.0)
Hemoglobin: 8.5 g/dL — ABNORMAL LOW (ref 13.0–17.0)
Hemoglobin: 8.8 g/dL — ABNORMAL LOW (ref 13.0–17.0)
O2 Saturation: 92 %
O2 Saturation: 94 %
O2 Saturation: 94 %
O2 Saturation: 95 %
O2 Saturation: 95 %
Patient temperature: 36.7
Patient temperature: 36.8
Patient temperature: 36.8
Patient temperature: 36.9
Patient temperature: 98.6
Potassium: 3.4 mmol/L — ABNORMAL LOW (ref 3.5–5.1)
Potassium: 4.3 mmol/L (ref 3.5–5.1)
Potassium: 4.4 mmol/L (ref 3.5–5.1)
Potassium: 4.4 mmol/L (ref 3.5–5.1)
Potassium: 4.4 mmol/L (ref 3.5–5.1)
Sodium: 152 mmol/L — ABNORMAL HIGH (ref 135–145)
Sodium: 155 mmol/L — ABNORMAL HIGH (ref 135–145)
Sodium: 155 mmol/L — ABNORMAL HIGH (ref 135–145)
Sodium: 155 mmol/L — ABNORMAL HIGH (ref 135–145)
Sodium: 156 mmol/L — ABNORMAL HIGH (ref 135–145)
TCO2: 29 mmol/L (ref 22–32)
TCO2: 32 mmol/L (ref 22–32)
TCO2: 33 mmol/L — ABNORMAL HIGH (ref 22–32)
TCO2: 35 mmol/L — ABNORMAL HIGH (ref 22–32)
TCO2: 35 mmol/L — ABNORMAL HIGH (ref 22–32)
pCO2 arterial: 50.7 mmHg — ABNORMAL HIGH (ref 32.0–48.0)
pCO2 arterial: 57.4 mmHg — ABNORMAL HIGH (ref 32.0–48.0)
pCO2 arterial: 58.7 mmHg — ABNORMAL HIGH (ref 32.0–48.0)
pCO2 arterial: 59.4 mmHg — ABNORMAL HIGH (ref 32.0–48.0)
pCO2 arterial: 63.6 mmHg — ABNORMAL HIGH (ref 32.0–48.0)
pH, Arterial: 7.299 — ABNORMAL LOW (ref 7.350–7.450)
pH, Arterial: 7.331 — ABNORMAL LOW (ref 7.350–7.450)
pH, Arterial: 7.342 — ABNORMAL LOW (ref 7.350–7.450)
pH, Arterial: 7.358 (ref 7.350–7.450)
pH, Arterial: 7.361 (ref 7.350–7.450)
pO2, Arterial: 71 mmHg — ABNORMAL LOW (ref 83.0–108.0)
pO2, Arterial: 74 mmHg — ABNORMAL LOW (ref 83.0–108.0)
pO2, Arterial: 75 mmHg — ABNORMAL LOW (ref 83.0–108.0)
pO2, Arterial: 79 mmHg — ABNORMAL LOW (ref 83.0–108.0)
pO2, Arterial: 81 mmHg — ABNORMAL LOW (ref 83.0–108.0)

## 2020-10-07 LAB — BPAM RBC
Blood Product Expiration Date: 202202012359
Blood Product Expiration Date: 202202012359
Blood Product Expiration Date: 202202012359
Blood Product Expiration Date: 202202032359
Blood Product Expiration Date: 202202082359
Blood Product Expiration Date: 202202082359
Blood Product Expiration Date: 202202092359
Blood Product Expiration Date: 202202092359
Blood Product Expiration Date: 202202102359
ISSUE DATE / TIME: 202201050740
ISSUE DATE / TIME: 202201050740
ISSUE DATE / TIME: 202201050740
ISSUE DATE / TIME: 202201051026
ISSUE DATE / TIME: 202201051204
ISSUE DATE / TIME: 202201060944
ISSUE DATE / TIME: 202201060944
ISSUE DATE / TIME: 202201061045
Unit Type and Rh: 5100
Unit Type and Rh: 5100
Unit Type and Rh: 5100
Unit Type and Rh: 5100
Unit Type and Rh: 5100
Unit Type and Rh: 5100
Unit Type and Rh: 5100
Unit Type and Rh: 5100
Unit Type and Rh: 5100

## 2020-10-07 LAB — RENAL FUNCTION PANEL
Albumin: 2.5 g/dL — ABNORMAL LOW (ref 3.5–5.0)
Albumin: 2.4 g/dL — ABNORMAL LOW (ref 3.5–5.0)
Anion gap: 9 (ref 5–15)
Anion gap: 8 (ref 5–15)
BUN: 73 mg/dL — ABNORMAL HIGH (ref 6–20)
BUN: 79 mg/dL — ABNORMAL HIGH (ref 6–20)
CO2: 29 mmol/L (ref 22–32)
CO2: 30 mmol/L (ref 22–32)
Calcium: 9.3 mg/dL (ref 8.9–10.3)
Calcium: 9 mg/dL (ref 8.9–10.3)
Chloride: 115 mmol/L — ABNORMAL HIGH (ref 98–111)
Chloride: 115 mmol/L — ABNORMAL HIGH (ref 98–111)
Creatinine, Ser: 1.08 mg/dL (ref 0.61–1.24)
Creatinine, Ser: 1.1 mg/dL (ref 0.61–1.24)
GFR, Estimated: >60 mL/min (ref >60)
GFR, Estimated: >60 mL/min (ref >60)
Glucose, Bld: 137 mg/dL — ABNORMAL HIGH (ref 70–99)
Glucose, Bld: 146 mg/dL — ABNORMAL HIGH (ref 70–99)
Phosphorus: 2.6 mg/dL (ref 2.5–4.6)
Phosphorus: 2.7 mg/dL (ref 2.5–4.6)
Potassium: 4.3 mmol/L (ref 3.5–5.1)
Potassium: 4.3 mmol/L (ref 3.5–5.1)
Sodium: 153 mmol/L — ABNORMAL HIGH (ref 135–145)
Sodium: 153 mmol/L — ABNORMAL HIGH (ref 135–145)

## 2020-10-07 LAB — TYPE AND SCREEN
ABO/RH(D): O POS
Antibody Screen: NEGATIVE
Unit division: 0
Unit division: 0
Unit division: 0
Unit division: 0
Unit division: 0
Unit division: 0
Unit division: 0
Unit division: 0
Unit division: 0

## 2020-10-07 LAB — APTT
aPTT: 49 seconds — ABNORMAL HIGH (ref 24–36)
aPTT: 59 seconds — ABNORMAL HIGH (ref 24–36)

## 2020-10-07 LAB — CULTURE, RESPIRATORY W GRAM STAIN

## 2020-10-07 LAB — CBC
HCT: 25.8 % — ABNORMAL LOW (ref 39.0–52.0)
HCT: 26.7 % — ABNORMAL LOW (ref 39.0–52.0)
Hemoglobin: 8.1 g/dL — ABNORMAL LOW (ref 13.0–17.0)
Hemoglobin: 8 g/dL — ABNORMAL LOW (ref 13.0–17.0)
MCH: 31.3 pg (ref 26.0–34.0)
MCH: 30.3 pg (ref 26.0–34.0)
MCHC: 31.4 g/dL (ref 30.0–36.0)
MCHC: 30 g/dL (ref 30.0–36.0)
MCV: 99.6 fL (ref 80.0–100.0)
MCV: 101.1 fL — ABNORMAL HIGH (ref 80.0–100.0)
Platelets: 183 10*3/uL (ref 150–400)
Platelets: 185 10*3/uL (ref 150–400)
RBC: 2.59 MIL/uL — ABNORMAL LOW (ref 4.22–5.81)
RBC: 2.64 MIL/uL — ABNORMAL LOW (ref 4.22–5.81)
RDW: 16.9 % — ABNORMAL HIGH (ref 11.5–15.5)
RDW: 16.7 % — ABNORMAL HIGH (ref 11.5–15.5)
WBC: 7 10*3/uL (ref 4.0–10.5)
WBC: 7.1 10*3/uL (ref 4.0–10.5)
nRBC: 0.3 % — ABNORMAL HIGH (ref 0.0–0.2)
nRBC: 0 % (ref 0.0–0.2)

## 2020-10-07 LAB — LACTIC ACID, PLASMA
Lactic Acid, Venous: 0.9 mmol/L (ref 0.5–1.9)
Lactic Acid, Venous: 0.9 mmol/L (ref 0.5–1.9)

## 2020-10-07 LAB — GLUCOSE, CAPILLARY
Glucose-Capillary: 124 mg/dL — ABNORMAL HIGH (ref 70–99)
Glucose-Capillary: 130 mg/dL — ABNORMAL HIGH (ref 70–99)
Glucose-Capillary: 140 mg/dL — ABNORMAL HIGH (ref 70–99)
Glucose-Capillary: 142 mg/dL — ABNORMAL HIGH (ref 70–99)
Glucose-Capillary: 162 mg/dL — ABNORMAL HIGH (ref 70–99)
Glucose-Capillary: 195 mg/dL — ABNORMAL HIGH (ref 70–99)

## 2020-10-07 LAB — MAGNESIUM: Magnesium: 2.8 mg/dL — ABNORMAL HIGH (ref 1.7–2.4)

## 2020-10-07 LAB — LACTATE DEHYDROGENASE: LDH: 519 U/L — ABNORMAL HIGH (ref 98–192)

## 2020-10-07 LAB — FIBRINOGEN: Fibrinogen: 331 mg/dL (ref 210–475)

## 2020-10-07 MED ORDER — FUROSEMIDE 10 MG/ML IJ SOLN
40.0000 mg | Freq: Two times a day (BID) | INTRAMUSCULAR | Status: DC
  Administered 2020-10-07 (×2): 40 mg via INTRAVENOUS
  Filled 2020-10-07 (×2): qty 4

## 2020-10-07 MED ORDER — FREE WATER
200.0000 mL | Status: DC
  Administered 2020-10-07 – 2020-10-08 (×6): 200 mL

## 2020-10-07 MED ORDER — METOCLOPRAMIDE HCL 5 MG/ML IJ SOLN
5.0000 mg | Freq: Three times a day (TID) | INTRAMUSCULAR | Status: DC
  Administered 2020-10-07 – 2020-10-08 (×3): 5 mg via INTRAVENOUS
  Filled 2020-10-07 (×3): qty 2

## 2020-10-07 MED ORDER — SODIUM CHLORIDE 3 % IN NEBU
4.0000 mL | INHALATION_SOLUTION | Freq: Four times a day (QID) | RESPIRATORY_TRACT | Status: DC
  Administered 2020-10-07 – 2020-10-08 (×2): 4 mL via RESPIRATORY_TRACT
  Filled 2020-10-07 (×5): qty 4

## 2020-10-07 MED ORDER — SODIUM CHLORIDE 0.9 % IV SOLN
0.0600 mg/kg/h | INTRAVENOUS | Status: DC
  Administered 2020-10-07: 0.05 mg/kg/h via INTRAVENOUS
  Administered 2020-10-09 – 2020-10-10 (×2): 0.06 mg/kg/h via INTRAVENOUS
  Filled 2020-10-07 (×6): qty 250

## 2020-10-07 MED ORDER — CLONIDINE HCL 0.1 MG PO TABS
0.1000 mg | ORAL_TABLET | Freq: Three times a day (TID) | ORAL | Status: DC
  Administered 2020-10-07 – 2020-10-12 (×14): 0.1 mg
  Filled 2020-10-07 (×14): qty 1

## 2020-10-07 MED ORDER — MIDAZOLAM HCL 2 MG/2ML IJ SOLN: 2.0000 mg | INTRAMUSCULAR | Status: DC | PRN

## 2020-10-07 MED ORDER — ALBUTEROL SULFATE (2.5 MG/3ML) 0.083% IN NEBU
2.5000 mg | INHALATION_SOLUTION | Freq: Four times a day (QID) | RESPIRATORY_TRACT | Status: DC
  Administered 2020-10-07 – 2020-10-08 (×3): 2.5 mg via RESPIRATORY_TRACT
  Filled 2020-10-07 (×3): qty 3

## 2020-10-07 MED ORDER — CLONIDINE HCL 0.1 MG PO TABS
0.1000 mg | ORAL_TABLET | Freq: Once | ORAL | Status: AC
  Administered 2020-10-07: 0.1 mg
  Filled 2020-10-07: qty 1

## 2020-10-07 NOTE — Progress Notes (Signed)
ANTICOAGULATION CONSULT NOTE  Pharmacy Consult for bivalirudin Indication: ECMO + bilateral DVTs + [facility] 12/26   Recent Labs    10/06/20 0317 10/06/20 0515 10/06/20 1613 10/06/20 1615 10/07/20 0314 10/07/20 0516 10/07/20 1202 10/07/20 1632 10/07/20 1638  HGB 8.6*   < > 8.0*   < > 8.1*   < > 8.2* 8.0* 7.8*  HCT 26.7*   < > 26.5*   < > 25.8*   < > 24.0* 26.7* 23.0*  PLT 183  --  180  --  183  --   --  185  --   APTT 53*  --  62*  --  49*  --   --  59*  --   LABPROT 17.5*  --   --   --   --   --   --   --   --   INR 1.5*  --   --   --   --   --   --   --   --   CREATININE 1.13  --  1.11  --  1.08  --   --  1.10  --    < > = values in this interval not displayed.    Estimated Creatinine Clearance: 84.3 mL/min (by C-G formula based on SCr of 1.1 mg/dL).   Assessment: 83 yom unvaccinated presenting with severe COVID ARDs, intubated on 12/6, continued to have ventilation issues despite NMB and proning - started on VV ECMO on 12/8. Pt noted to have bilateral DVTs 12/8. Bivalirudin started for ECMO anticoagulation. Head CT 12/26 with new small SAH, repeat 12/27 stable without expansion. Repeat heat CT 1/5 SAH improved.   APTT 59sec at goal tonight  on bivalirudin@0 .05 mg/kg/hr. Hgb stable at 8, pltc stable 150-200. No visible/obvious bleeding per discussion w/ RN, circuit stable. LDH/fibronegen stable w/ AM labs.  Goal of Therapy:  aPTT 50-70 seconds  Monitor platelets by anticoagulation protocol: Yes   Plan:  Continue  bivalirudin to 0.05 mg/kg/hr Recheck aPTTs every 5a & 5p with ECMO labs.  Leota Sauers Pharm.D. CPP, BCPS Clinical Pharmacist [phone] 10/07/2020 7:59 PM    Please check [email or web address] for unit-specific pharmacy phone numbers.

## 2020-10-07 NOTE — Procedures (Signed)
Extracorporeal support note     ECLS cannulation: 09/25/2020 Last circuit change: 09/11/2020  Indication: COVID ARDS   Configuration: VV, RIJ 32Fr [place]   Pump speed: 3400 Pump flow: 4.6 L Pump used: Cardiohelp   Sweep gas: 100%, 9 LPM   Circuit check: Clot only in corners of oxygenator Anticoagulant: bivalirudin   Changes in support: Con't aggressive care. Con't antibiotics for staph pneumonia. PTT goal 50-70. Minimize IV infusions, con't with scheduled and PRN agitaiton meds. Needs PEG tube given prolonged course of critical illness expected- will revisit with IR next week.  anticipated goals/duration of support: Bridge to recovery, day 31 of support.  Steffanie Dunn, DO 10/07/20 9:01 AM [facility]

## 2020-10-07 NOTE — Progress Notes (Signed)
   Palliative Medicine Inpatient Follow Up Note  Reason for consult:  "ECMO"  HPI:  Per intake H&P --> Donald Rowe a 59 y.o.malewith a pertinent history ofhypothyroidism on levothyroxine and hyperlipidemia who was diagnosed with Covid 1 week ago and is not vaccinated. With EMS he was seen to be desaturating to 78% on room air. He states he started having symptoms on Friday and tested positive on Saturday.  Had initially elected to be DNAR without intubation though later changed this and got intubated on 12/6  in the setting of worsening respiratory failure with hypoxia. CTA chest w/ diffuse bilateral infiltrates. On 12/8 was cannulated for VV ECMO. Had been extubated on 12/9 then emergently reintubated in early evening due to clinical instability.   Palliative care was asked to get involved in the setting of serious illness/ ECMO to set goals and expectations   Today's Discussion (10/07/2020): Chart reviewed.  Attended ECMO rounds this morning.  Patient continues to have complicated ICU associated delirium. Some changes were made to clonazepam dosing frequency, haldol was added back. SAH remains stable (CT head 1/5). On BS abx while awaiting cultures from bronchoscopy (completed on 1/5). Labile HTN continues though Cardiology,  Intensivist, and pharmacy are carefully balancing medications in regards to this. Ongoing discussions related to PEG placement amongst specialists. PT/OT support will be ongoing to better optimize ROM and physical function.  Upon assessment patient more awake. Per nursing he at times is able to follow some basic commands.   I spoke to patient's wife [name].  We discussed the patient's modifications and medications to ideally aid in him having more wakeful hours.  She shares that she was able to speak to Dr. Chestine Spore yesterday and this conversation aided in her understanding of medication cessation and titration.  We continue to offer ongoing support.  I have  asked social work to provide resources on grief counseling given how difficult it is for Donald Rowe's family while he is in this tenuous health state.  Questions and concerns addressed  SUMMARY OF RECOMMENDATIONS Full Code/Full Scope of Care  ECMO as a bridge to recovery  Spiritual support appreciated  Social Work -appreciate providing resources to family on grief counseling  Ongoing PMT support    Time Spent: 25 Greater than 50% of the time was spent in counseling and coordination of care ______________________________________________________________________________________ [name], NP [facility] Palliative Medicine Team Team Cell Phone: [phone] Please utilize secure chat with additional questions, if there is no response within 30 minutes please call the above phone number  Palliative Medicine Team providers are available by phone from 7am to 7pm daily and can be reached through the team cell phone.  Should this patient require assistance outside of these hours, please call the patient's attending physician.

## 2020-10-07 NOTE — Progress Notes (Signed)
ANTICOAGULATION CONSULT NOTE  Pharmacy Consult for bivalirudin Indication: ECMO + bilateral DVTs + [facility] 12/26   Recent Labs    10/06/20 0317 10/06/20 0515 10/06/20 1613 10/06/20 1615 10/07/20 0049 10/07/20 0314 10/07/20 0516  HGB 8.6*   < > 8.0*   < > 8.5* 8.1* 8.8*  HCT 26.7*   < > 26.5*   < > 25.0* 25.8* 26.0*  PLT 183  --  180  --   --  183  --   APTT 53*  --  62*  --   --  49*  --   LABPROT 17.5*  --   --   --   --   --   --   INR 1.5*  --   --   --   --   --   --   CREATININE 1.13  --  1.11  --   --  1.08  --    < > = values in this interval not displayed.    Estimated Creatinine Clearance: 85.8 mL/min (by C-G formula based on SCr of 1.08 mg/dL).   Assessment: 31 yom unvaccinated presenting with severe COVID ARDs, intubated on 12/6, continued to have ventilation issues despite NMB and proning - started on VV ECMO on 12/8. Pt noted to have bilateral DVTs 12/8. Bivalirudin started for ECMO anticoagulation. Head CT 12/26 with new small SAH, repeat 12/27 stable without expansion. Repeat heat CT 1/5 SAH improved.   APTT is slightly subtherapeutic at 49, on bivalirudin@0 .04 mg/kg/hr. Hgb stable at 8.1 this morning. No visible/obvious bleeding per discussion w/ RN, circuit stable. LDH/fibronegen stable w/ AM labs. After discussion with MDs, will increase infusion rate slightly.  Goal of Therapy:  aPTT 50-70 seconds  Monitor platelets by anticoagulation protocol: Yes   Plan:  Increase bivalirudin to 0.05 mg/kg/hr Recheck aPTTs every 5a & 5p with ECMO labs.  Tama Headings, PharmD, BCPS PGY2 Cardiology Pharmacy Resident Phone: [phone] 10/07/2020  7:19 AM  Please check [email or web address] for unit-specific pharmacy phone numbers.

## 2020-10-07 NOTE — Progress Notes (Signed)
NAME:  Donald Rowe, MRN:  [id number], DOB:  08/26/1961, LOS: 53 ADMISSION DATE:  09/19/2020, CONSULTATION DATE:  12/6 REFERRING MD:  Dr. Aileen Fass, CHIEF COMPLAINT:  SOB    Brief History   61 y/o M admitted 12/4 with 1 week hx of SOB, known COVID positive.  He is unvaccinated.  CTA chest negative for PE but demonstrated diffuse bilateral infiltrates. [place] started on 12/8   Past Medical History  Hypothyroidism  [facility] Events/Procedures  12/04 Admit  12/06 PCCM consulted  12/07 Intubated, central line, a line 12/08 VV ECMO Cannulaton, cortrak 12/13 circuit change for hemolytic anemia 12/16 Tracheostomy  12/17 bronch w/ BAL  Consults:  PCCM, Heart failure, TCTS, ECMO  Significant Diagnostic Tests:   CTA Chest 12/4 >> extensive bilateral airspace disease, no large PE identified, limited study   LE Venous Duplex 12/4 >> negative for DVT bilaterally   LE venous duplex 12/8> BLE DVTs  RUE venous duplex 12/23  RUE arterial duplex 12/23  CT head 12/26 : Minimal SAH on left side  CT abdomen and pelvis 12/26: Extensive bilateral airspace disease with small pleural effusion  CT head 12/27: Subarachnoid unchanged  Chest x-ray 1/1: Significant improvement in airspace disease  Micro Data:  COVID 12/4 >> negative  Influenza A/B 12/4 >> negative  MRSA PCR 12/5 >> negative  BCx2 12/4 >> NG Trach aspirate 12/12> rare MRSA BAL 12/17> MRSA, candida Aspergillus Ag BAL 12/17> 0.05 12/18 sars2: positive  Antimicrobials:  Azithromycin 12/5 >> 12/6  Ceftriaxone 12/5 >> 12/6  Cefepime 12/7>12/14 vanc 12/14>12/20 Meropenem 12/17>12/21  Interim history/subjective:  More alert, interactive today. Denies pain. On fentanyl infusion became hypotensive overnight.  Objective   Blood pressure (!) 147/70, pulse (!) 115, temperature 98.1 F (36.7 C), resp. rate (!) 39, height _0  (1.753 m), weight 99.9 kg, SpO2 93 %.    Vent Mode: PCV FiO2 (%):  [50 %] 50  % Set Rate:  [10 bmp] 10 bmp PEEP:  [10 cmH20] 10 cmH20 Plateau Pressure:  [22 cmH20] 22 cmH20   Intake/Output Summary (Last 24 hours) at 10/07/2020 0723 Last data filed at 10/07/2020 0600 Gross per 24 hour  Intake 4054.98 ml  Output 2387 ml  Net 1667.98 ml   Filed Weights   10/05/20 0500 10/06/20 0556 10/07/20 0500  Weight: 98 kg 96 kg 99.9 kg    Examination: Gen: Ill-appearing man lying in bed no acute distress, on mechanical ventilation & ECMO HEENT: [place]/AT, eyes anicteric, cortrak in place Neck: Trach without bleeding or secretions, right IJ ECMO cannula CV: Tachycardic, regular rhythm Pulm: Bilateral rales improving, tidal volumes around 250 cc.  Tachypneic, no increased work of breathing. Abd: Soft, nontender, nondistended Extm: Dependent edema resolved, compression stockings in place Skin: Warm, dry, pallor Neuro: RASS +1, still sometimes shaking his head.  Staring off, but able to answer yes and no questions with nodding.  Moving all extremities spontaneously.  CXR personally reviewed> trach & ECMO cannula in appropriate position, improving R and LUL opacities, LLL more densely opacitified with silhouetting.   Resolved Hospital Problem list     Assessment & Plan:   Acute hypoxemic and hypercarbic respiratory failure secondary to COVID PNA with severe ARDS. S/p VV ECMO cannulation 09/27/2020.  S/p tracheostomy 56/21 complicated by trach site bleeding improved Trivial left-sided subarachnoid hemorrhage Community-acquired pneumonia with MRSA- completed treatment. LLL HAP-staph aureus -Completed remdesivir, solumedrol, and baricitnib. -Con't full ECMO support, weaning sweep as tolerated. -Completed for treatment with 4 sessions  of Plex for hemolysis -Furosemide increased to twice daily for goal euvolemia  -Completed 7 days vanc for MRSA pneumonia. Con't empiric antibiotics while awaiting bronch cultures for LLL while awaiting sensitivities  Agitated delirium requiring  titration of IV sedation.  Periods of severe agitation are limiting ability to wean ECMO -minimize infusions> decrease max dose of fentanyl -Con't enteral seroquel, klonopin- decreasing TDD slowly, currently 0.86m TID -delirium remains main limitation to rehabilitation at this point  Bilateral lower ext DVTs- provoked by COVID infection -Continue bivalirudin; goal PTT 50-70 -needs 3 months AC for provoked DVT   Steroid-induced hyperglycemia- controlled  -SSI PRN -goal BG 140-180  Hypothyroidism -con't PTA synthroid   Labile hypertension, contributed by periods of agitation.  Hypotension after meds. Not needing pressors or cardene at this point. -space out sedating meds and antihypertensives -con't clonidine, amlodipine, and metoprolol -agitation control helps moderate BP -Decrease max dose on fentanyl infusion; need to minimize as much as possible  Hemolytic anemia - improved. More expected down trending of Hb with expected losses associated with ECMO Throbocytopenia Hyperbilirubinemia Rheumatologic-associated MAHA remains in differential.  hgb and plt are stable -Completed treatment with the Plex for 4 sessions -Transfuse if hgb <8  Vomiting- resolved -con't reglan; decrease to 570mQ8h -TF at goal -ok to proceed with PEG at any point  [facility] in [facility]> resolved on follow up imaging -PTT goals 50-70  Hypernatremia -increase FWF -con't to monitor  Deconditioning -long road of rehab ahead, improving mentation will allow for more participation with therapy and higher level mobility. -ok for PEG tube   Best practice (evaluated daily)  Diet: TF Pain/Anxiety/Delirium protocol (if indicated): PAD protocol, limit IV meds VAP protocol (if indicated): yes DVT prophylaxis: Bivalirudin GI prophylaxis: pantoprazole BID Glucose control: SSI Mobility: As tolerated Last date of multidisciplinary goals of care discussion: 10/07/19 with wife [name] at bedside, RN,  ECMO specailist Summary of discussion con't aggressive care Follow up goals of care discussion due: 1/14 Code Status: Full Disposition: ICU  This patient is critically ill with multiple organ system failure which requires frequent high complexity decision making, assessment, support, evaluation, and titration of therapies. This was completed through the application of advanced monitoring technologies and extensive interpretation of multiple databases. During this encounter critical care time was devoted to patient care services described in this note for 52 minutes. Multidisciplinary ECMO rounds completed.   LaJulian HyDO 10/07/20 7:23 AM [facility]

## 2020-10-07 NOTE — Progress Notes (Signed)
Patient ID: Donald Rowe, male   DOB: 05/21/1961, 60 y.o.   MRN: [id number]    Advanced Heart Failure Rounding Note   Subjective:    12/8 Cannulated for VV ECMO - 32 FR  [facility] [place] 12/9 Extubated/Reintubated for altered mental status 12/13 Circuit change for elevated LDH 12/15 PLEX started 12/16 Tracheostomy 12/23 head CT (normal) for change in mental status.  12/26 Pan CT for drop in hgb. Small SAH. Severe lung disease 12/27 CVVH begun 1/1 CVVH stopped  Off propofol, only getting Fentanyl.  Now with purposeful movement, awake this morning.   Ileus improved, trickle feeds restarted.   SBP 150s, currently off nicardipine.  Getting clonidine, amlodipine, and metoprolol.  I/Os positive with weight up.   Trach aspirate with Staph aureus, vancomycin/cefepime restarted.  CXR with some improvement this morning.   Had PLEX 4/4 completed 12/22.   Na up to 153.   ECMO   Speed 3400 Flow 4.6 L Sweep 9 dP 25 pVen -66  ABG: 7.3/64/71/92% Hgb 8.1 LDH 434 -> 544 -> 419 -> 551 -> 579 -> 590 -> 438 -> 518 -> 488 -> 514 -> 498 -> 519 Lactic acid 0.9 PTT 49  Vent 50%   Objective:   Weight Range:  Vital Signs:   Temp:  [98.1 F (36.7 C)-98.4 F (36.9 C)] 98.1 F (36.7 C) (01/08 0400) Pulse Rate:  [82-137] 115 (01/08 0700) Resp:  [0-55] 39 (01/08 0700) BP: (76-173)/(47-90) 147/70 (01/08 0300) SpO2:  [88 %-99 %] 93 % (01/08 0700) Arterial Line BP: (71-189)/(32-79) 181/73 (01/08 0700) FiO2 (%):  [50 %] 50 % (01/08 0409) Weight:  [99.9 kg] 99.9 kg (01/08 0500) Last BM Date: 10/06/20  Weight change: Filed Weights   10/05/20 0500 10/06/20 0556 10/07/20 0500  Weight: 98 kg 96 kg 99.9 kg    Intake/Output:   Intake/Output Summary (Last 24 hours) at 10/07/2020 0743 Last data filed at 10/07/2020 0700 Gross per 24 hour  Intake 4209.02 ml  Output 2387 ml  Net 1822.02 ml     Physical Exam: General: Awake on vent Neck: Tracheostomy.  No JVD, no thyromegaly or thyroid  nodule.  Lungs: Decreased at bases.  CV: Nondisplaced PMI.  Heart regular S1/S2, no S3/S4, no murmur. 1+ ankle peripheral edema.   Abdomen: Soft, nontender, no hepatosplenomegaly, no distention.  Skin: Intact without lesions or rashes.  Neurologic: Awake, will follow commands.  Extremities: No clubbing or cyanosis.  HEENT: Normal.   Telemetry: sinus 110s Personally reviewed    Labs: Basic Metabolic Panel: Recent Labs  Lab 10/03/20 0320 10/03/20 1133 10/04/20 0403 10/04/20 1623 10/05/20 0346 10/05/20 0636 10/05/20 1540 10/05/20 1547 10/06/20 0317 10/06/20 0515 10/06/20 1613 10/06/20 1615 10/06/20 1951 10/07/20 0049 10/07/20 0314 10/07/20 0516  NA 140  140   < > 141   < > 144  148*   < > 148*   < > 148*   < > 150* 152* 152* 152* 153* 155*  K 4.6  4.7   < > 3.8   < > 4.8  4.3   < > 4.0   < > 3.6   < > 4.7 4.7 4.4 4.4 4.3 4.4  CL 106   < > 111   < > 108  --  109  --  111  --  113*  --   --   --  115*  --   CO2 26   < > 24   < > 29  --  30  --  28  --  29  --   --   --  29  --   GLUCOSE 111*   < > 102*   < > 133*  --  150*  --  162*  --  152*  --   --   --  137*  --   BUN 70*   < > 53*   < > 63*  --  60*  --  64*  --  69*  --   --   --  73*  --   CREATININE 1.28*   < > 0.99   < > 1.14  --  1.11  --  1.13  --  1.11  --   --   --  1.08  --   CALCIUM 8.9   < > 7.7*   < > 8.9  --  9.0  --  9.0  --  9.2  --   --   --  9.3  --   MG 2.5*  --  2.0  --  2.2  --   --   --  2.4  --   --   --   --   --  2.8*  --   PHOS 5.5*   < > 3.2   < > 2.8  --  3.7  --  3.4  --  2.9  --   --   --  2.6  --    < > = values in this interval not displayed.    Liver Function Tests: Recent Labs  Lab 10/02/20 0401 10/02/20 1600 10/05/20 0346 10/05/20 1540 10/06/20 0317 10/06/20 1613 10/07/20 0314  AST 48*  --   --   --   --   --   --   ALT 46*  --   --   --   --   --   --   ALKPHOS 141*  --   --   --   --   --   --   BILITOT 1.1  --   --   --   --   --   --   PROT 5.9*  --   --   --   --    --   --   ALBUMIN 3.0*  3.0*   < > 2.6* 2.6* 2.4* 2.3* 2.5*   < > = values in this interval not displayed.   No results for input(s): LIPASE, AMYLASE in the last 168 hours. No results for input(s): AMMONIA in the last 168 hours.  CBC: Recent Labs  Lab 10/05/20 0346 10/05/20 0636 10/05/20 1540 10/05/20 1547 10/06/20 0317 10/06/20 0515 10/06/20 1613 10/06/20 1615 10/06/20 1951 10/07/20 0049 10/07/20 0314 10/07/20 0516  WBC 7.4  --  7.8  --  6.9  --  6.3  --   --   --  7.0  --   HGB 7.8*  7.5*   < > 9.0*   < > 8.6*   < > 8.0* 7.5* 8.8* 8.5* 8.1* 8.8*  HCT 23.9*  22.0*   < > 28.7*   < > 26.7*   < > 26.5* 22.0* 26.0* 25.0* 25.8* 26.0*  MCV 96.8  --  98.0  --  98.9  --  100.4*  --   --   --  99.6  --   PLT 176  --  195  --  183  --  180  --   --   --  183  --    < > =  values in this interval not displayed.    Cardiac Enzymes: No results for input(s): CKTOTAL, CKMB, CKMBINDEX, TROPONINI in the last 168 hours.  BNP: BNP (last 3 results) No results for input(s): BNP in the last 8760 hours.  ProBNP (last 3 results) No results for input(s): PROBNP in the last 8760 hours.    Other results:  Imaging: DG CHEST PORT 1 VIEW  Result Date: 10/07/2020 CLINICAL DATA:  Personal history of ECMO EXAM: PORTABLE CHEST 1 VIEW COMPARISON:  Yesterday FINDINGS: ECMO catheter in similar position. Unremarkable positioning of left PICC, tracheostomy tube, and feeding tube. Unchanged confluent pneumonia. No visible effusion or pneumothorax. Cardiomegaly. IMPRESSION: Stable hardware positioning and bilateral pneumonia. Electronically Signed   By: Marnee Spring M.D.   On: 10/07/2020 07:37   DG CHEST PORT 1 VIEW  Result Date: 10/06/2020 CLINICAL DATA:  ECMO.  COVID. EXAM: PORTABLE CHEST 1 VIEW COMPARISON:  Yesterday FINDINGS: Confluent airspace disease. ECMO catheter in stable position. Tracheostomy tube in place. The enteric tube at least reaches the stomach. Left subclavian line with tip at the  SVC. No air leak. Normal heart size. IMPRESSION: Stable hardware positioning and confluent pneumonia. Electronically Signed   By: Marnee Spring M.D.   On: 10/06/2020 06:50     Medications:     Scheduled Medications: . amLODipine  10 mg Per Tube q1800  . chlorhexidine gluconate (MEDLINE KIT)  15 mL Mouth Rinse BID  . Chlorhexidine Gluconate Cloth  6 each Topical Daily  . clonazePAM  0.5 mg Per Tube Q8H  . cloNIDine  0.1 mg Per Tube TID  . docusate  100 mg Per Tube BID  . feeding supplement (PROSource TF)  45 mL Per Tube QID  . fiber  1 packet Per Tube BID  . free water  200 mL Per Tube Q4H  . furosemide  40 mg Intravenous BID  . insulin aspart  2-6 Units Subcutaneous Q4H  . levothyroxine  50 mcg Per Tube Q0600  . lidocaine  1 patch Transdermal Q24H  . mouth rinse  15 mL Mouth Rinse 10 times per day  . metoCLOPramide (REGLAN) injection  5 mg Intravenous Q8H  . metoprolol tartrate  50 mg Per Tube Q12H  . mupirocin ointment  1 application Nasal BID  . nystatin  5 mL Per Tube QID  . oxyCODONE  10 mg Per Tube Q4H  . pantoprazole sodium  40 mg Per Tube BID  . polyethylene glycol  17 g Per Tube BID  . pravastatin  40 mg Per Tube q1800  . QUEtiapine  150 mg Per Tube TID  . sodium chloride flush  10-40 mL Intracatheter Q12H    Infusions: . sodium chloride 10 mL/hr at 10/07/20 0700  . albumin human 12.5 g (09/29/20 1644)  . bivalirudin (ANGIOMAX) infusion 0.5 mg/mL (Non-ACS indications) 0.04 mg/kg/hr (10/07/20 0700)  . ceFEPime (MAXIPIME) IV Stopped (10/07/20 1448)  . dexmedetomidine (PRECEDEX) IV infusion Stopped (10/05/20 1549)  . feeding supplement (PIVOT 1.5 CAL) 1,000 mL (10/06/20 1101)  . fentaNYL infusion INTRAVENOUS Stopped (10/07/20 0618)  . niCARDipine Stopped (09/30/20 1856)  . vancomycin Stopped (10/06/20 1938)    PRN Medications: acetaminophen (TYLENOL) oral liquid 160 mg/5 mL, albumin human, albuterol, diphenhydrAMINE, fentaNYL, Gerhardt's butt cream,  guaiFENesin-dextromethorphan, haloperidol lactate, labetalol, lip balm, LORazepam, midazolam, ondansetron **OR** ondansetron (ZOFRAN) IV, sodium chloride flush   Assessment/Plan:   1. Acute hypoxic respiratory failure/ARDS in setting of COVID-19 PNA - Cannulated for VV ECMO on 12/8 after failing intubation x 2  days - has complete remedisivir, baricitinib, Solumedrol.  - s/p trach 12/16 - CT chest 12/26 with severe lung disease and pneumomediastinum.  - Restarted vanc/cefepime 1/7 with Staph in trach aspirate.  - Circuit changed 12/13 for elevated LDH and lower flows. PLEX started 12/15. S/p PLEX #4/4 on 12/21. LDH has been stable. Circuit parameters stable.   - Sweep higher at 9 today with hypercarbia on overnight ABG, wean now that he is awake.    - CVVH started 12/27 with intractable volume overload, stopped 1/1. Creatinine stable. I/Os even with weight now rising => will give Lasix 40 mg IV bid today (got 1 dose yesterday).   - Bivalirudin with PTT goal back up to 50-70, increase today.  - Mental status seems to be improving. Now off propofol with purposeful movement.    2. Bilateral DVT - remains on bivalirudin - Dosing d/w PharmD, increased PTT goal back to 50-70.   3. DM2 - Insulin adjusted. CBGs improved   4. Obesity - Body mass index is 32.52 kg/m.  5. F/E/N - Restarted trickle feeds, ileus improved.   6. HTN - Remains very labile  - Now off nicardipine and getting amlodipine, clonidine, and metoprolol.   7. Rectal bleeding - follow closely. Rectal tube removed  8. AKI - Now off CVVH, creatinine so far stable.   9. Acute encephalopathy - remains agitated  - head CT ok 09/07/20 and 12/21 - head CT 12/26. Small SAH.  Repeat CT on 12/27 with unchanged small SAH.   10. Small SAH - on CT 12/26 - repeat CT 12/27 unchanged small SAH.  - PTT goal reduced 40-50 for several days, plan to go back to 50-70 today.  - avoid severe HTN as best as possible  11. Gastric  ileus - Getting Reglan IV - Seems to be resolving.  - Trickle feeds restarted.    12. Thrombocytopenia - Stable, mild.   13. Anemia - Transfuse hgb < 8.  - Has been getting periodic blood without overt GI bleeding.   Plan discussed on multi-discplinary ECMO rounds with CCM, ECMO coordinator/specialist, PharmDs and RNs.  CRITICAL CARE Performed by: Loralie Champagne  Total critical care time: 40 minutes  Critical care time was exclusive of separately billable procedures and treating other patients.  Critical care was necessary to treat or prevent imminent or life-threatening deterioration.  Critical care was time spent personally by me (independent of midlevel providers or residents) on the following activities: development of treatment plan with patient and/or surrogate as well as nursing, discussions with consultants, evaluation of patient's response to treatment, examination of patient, obtaining history from patient or surrogate, ordering and performing treatments and interventions, ordering and review of laboratory studies, ordering and review of radiographic studies, pulse oximetry and re-evaluation of patient's condition.    Length of Stay: 35   Loralie Champagne MD 10/07/2020, 7:43 AM  Advanced Heart Failure Team Pager [phone] (M-F; 7a - 4p)  Please contact [facility] for night-coverage after hours (4p -7a ) and weekends on [email or web address]

## 2020-10-08 ENCOUNTER — Inpatient Hospital Stay (HOSPITAL_COMMUNITY): Payer: HRSA Program

## 2020-10-08 DIAGNOSIS — Z515 Encounter for palliative care: Secondary | ICD-10-CM | POA: Diagnosis not present

## 2020-10-08 DIAGNOSIS — J80 Acute respiratory distress syndrome: Secondary | ICD-10-CM | POA: Diagnosis not present

## 2020-10-08 DIAGNOSIS — Z7189 Other specified counseling: Secondary | ICD-10-CM | POA: Diagnosis not present

## 2020-10-08 DIAGNOSIS — G9340 Encephalopathy, unspecified: Secondary | ICD-10-CM | POA: Diagnosis not present

## 2020-10-08 DIAGNOSIS — Z9281 Personal history of extracorporeal membrane oxygenation (ECMO): Secondary | ICD-10-CM | POA: Diagnosis not present

## 2020-10-08 DIAGNOSIS — J9601 Acute respiratory failure with hypoxia: Secondary | ICD-10-CM | POA: Diagnosis not present

## 2020-10-08 DIAGNOSIS — Z9911 Dependence on respirator [ventilator] status: Secondary | ICD-10-CM | POA: Diagnosis not present

## 2020-10-08 DIAGNOSIS — U071 COVID-19: Secondary | ICD-10-CM | POA: Diagnosis not present

## 2020-10-08 LAB — POCT I-STAT 7, (LYTES, BLD GAS, ICA,H+H)
Acid-Base Excess: 5 mmol/L — ABNORMAL HIGH (ref 0.0–2.0)
Acid-Base Excess: 8 mmol/L — ABNORMAL HIGH (ref 0.0–2.0)
Acid-Base Excess: 9 mmol/L — ABNORMAL HIGH (ref 0.0–2.0)
Acid-Base Excess: 7 mmol/L — ABNORMAL HIGH (ref 0.0–2.0)
Acid-Base Excess: 7 mmol/L — ABNORMAL HIGH (ref 0.0–2.0)
Acid-Base Excess: 7 mmol/L — ABNORMAL HIGH (ref 0.0–2.0)
Acid-Base Excess: 4 mmol/L — ABNORMAL HIGH (ref 0.0–2.0)
Acid-Base Excess: 4 mmol/L — ABNORMAL HIGH (ref 0.0–2.0)
Bicarbonate: 30.5 mmol/L — ABNORMAL HIGH (ref 20.0–28.0)
Bicarbonate: 34.4 mmol/L — ABNORMAL HIGH (ref 20.0–28.0)
Bicarbonate: 35.3 mmol/L — ABNORMAL HIGH (ref 20.0–28.0)
Bicarbonate: 34.3 mmol/L — ABNORMAL HIGH (ref 20.0–28.0)
Bicarbonate: 33.6 mmol/L — ABNORMAL HIGH (ref 20.0–28.0)
Bicarbonate: 35 mmol/L — ABNORMAL HIGH (ref 20.0–28.0)
Bicarbonate: 32 mmol/L — ABNORMAL HIGH (ref 20.0–28.0)
Bicarbonate: 32.4 mmol/L — ABNORMAL HIGH (ref 20.0–28.0)
Calcium, Ion: 1.34 mmol/L (ref 1.15–1.40)
Calcium, Ion: 1.21 mmol/L (ref 1.15–1.40)
Calcium, Ion: 1.31 mmol/L (ref 1.15–1.40)
Calcium, Ion: 1.35 mmol/L (ref 1.15–1.40)
Calcium, Ion: 1.33 mmol/L (ref 1.15–1.40)
Calcium, Ion: 1.36 mmol/L (ref 1.15–1.40)
Calcium, Ion: 1.33 mmol/L (ref 1.15–1.40)
Calcium, Ion: 1.38 mmol/L (ref 1.15–1.40)
HCT: 22 % — ABNORMAL LOW (ref 39.0–52.0)
HCT: 23 % — ABNORMAL LOW (ref 39.0–52.0)
HCT: 23 % — ABNORMAL LOW (ref 39.0–52.0)
HCT: 22 % — ABNORMAL LOW (ref 39.0–52.0)
HCT: 21 % — ABNORMAL LOW (ref 39.0–52.0)
HCT: 23 % — ABNORMAL LOW (ref 39.0–52.0)
HCT: 23 % — ABNORMAL LOW (ref 39.0–52.0)
HCT: 25 % — ABNORMAL LOW (ref 39.0–52.0)
Hemoglobin: 7.5 g/dL — ABNORMAL LOW (ref 13.0–17.0)
Hemoglobin: 7.8 g/dL — ABNORMAL LOW (ref 13.0–17.0)
Hemoglobin: 7.8 g/dL — ABNORMAL LOW (ref 13.0–17.0)
Hemoglobin: 7.5 g/dL — ABNORMAL LOW (ref 13.0–17.0)
Hemoglobin: 7.1 g/dL — ABNORMAL LOW (ref 13.0–17.0)
Hemoglobin: 7.8 g/dL — ABNORMAL LOW (ref 13.0–17.0)
Hemoglobin: 7.8 g/dL — ABNORMAL LOW (ref 13.0–17.0)
Hemoglobin: 8.5 g/dL — ABNORMAL LOW (ref 13.0–17.0)
O2 Saturation: 95 %
O2 Saturation: 95 %
O2 Saturation: 96 %
O2 Saturation: 98 %
O2 Saturation: 97 %
O2 Saturation: 97 %
O2 Saturation: 97 %
O2 Saturation: 93 %
Patient temperature: 36.8
Patient temperature: 37
Patient temperature: 36.9
Patient temperature: 36.9
Patient temperature: 36.7
Patient temperature: 36.7
Patient temperature: 36.7
Potassium: 3.6 mmol/L (ref 3.5–5.1)
Potassium: 4.6 mmol/L (ref 3.5–5.1)
Potassium: 4 mmol/L (ref 3.5–5.1)
Potassium: 3.5 mmol/L (ref 3.5–5.1)
Potassium: 3.8 mmol/L (ref 3.5–5.1)
Potassium: 4.4 mmol/L (ref 3.5–5.1)
Potassium: 4 mmol/L (ref 3.5–5.1)
Potassium: 4.1 mmol/L (ref 3.5–5.1)
Sodium: 156 mmol/L — ABNORMAL HIGH (ref 135–145)
Sodium: 155 mmol/L — ABNORMAL HIGH (ref 135–145)
Sodium: 156 mmol/L — ABNORMAL HIGH (ref 135–145)
Sodium: 156 mmol/L — ABNORMAL HIGH (ref 135–145)
Sodium: 155 mmol/L — ABNORMAL HIGH (ref 135–145)
Sodium: 155 mmol/L — ABNORMAL HIGH (ref 135–145)
Sodium: 154 mmol/L — ABNORMAL HIGH (ref 135–145)
Sodium: 155 mmol/L — ABNORMAL HIGH (ref 135–145)
TCO2: 32 mmol/L (ref 22–32)
TCO2: 36 mmol/L — ABNORMAL HIGH (ref 22–32)
TCO2: 37 mmol/L — ABNORMAL HIGH (ref 22–32)
TCO2: 36 mmol/L — ABNORMAL HIGH (ref 22–32)
TCO2: 35 mmol/L — ABNORMAL HIGH (ref 22–32)
TCO2: 37 mmol/L — ABNORMAL HIGH (ref 22–32)
TCO2: 34 mmol/L — ABNORMAL HIGH (ref 22–32)
TCO2: 35 mmol/L — ABNORMAL HIGH (ref 22–32)
pCO2 arterial: 52.9 mmHg — ABNORMAL HIGH (ref 32.0–48.0)
pCO2 arterial: 56.8 mmHg — ABNORMAL HIGH (ref 32.0–48.0)
pCO2 arterial: 59.2 mmHg — ABNORMAL HIGH (ref 32.0–48.0)
pCO2 arterial: 65.3 mmHg (ref 32.0–48.0)
pCO2 arterial: 59.9 mmHg — ABNORMAL HIGH (ref 32.0–48.0)
pCO2 arterial: 72.7 mmHg (ref 32.0–48.0)
pCO2 arterial: 72.5 mmHg (ref 32.0–48.0)
pCO2 arterial: 76.2 mmHg (ref 32.0–48.0)
pH, Arterial: 7.369 (ref 7.350–7.450)
pH, Arterial: 7.389 (ref 7.350–7.450)
pH, Arterial: 7.384 (ref 7.350–7.450)
pH, Arterial: 7.328 — ABNORMAL LOW (ref 7.350–7.450)
pH, Arterial: 7.356 (ref 7.350–7.450)
pH, Arterial: 7.289 — ABNORMAL LOW (ref 7.350–7.450)
pH, Arterial: 7.252 — ABNORMAL LOW (ref 7.350–7.450)
pH, Arterial: 7.235 — ABNORMAL LOW (ref 7.350–7.450)
pO2, Arterial: 79 mmHg — ABNORMAL LOW (ref 83.0–108.0)
pO2, Arterial: 76 mmHg — ABNORMAL LOW (ref 83.0–108.0)
pO2, Arterial: 88 mmHg (ref 83.0–108.0)
pO2, Arterial: 122 mmHg — ABNORMAL HIGH (ref 83.0–108.0)
pO2, Arterial: 98 mmHg (ref 83.0–108.0)
pO2, Arterial: 108 mmHg (ref 83.0–108.0)
pO2, Arterial: 104 mmHg (ref 83.0–108.0)
pO2, Arterial: 82 mmHg — ABNORMAL LOW (ref 83.0–108.0)

## 2020-10-08 LAB — CBC
HCT: 24.7 % — ABNORMAL LOW (ref 39.0–52.0)
HCT: 23.3 % — ABNORMAL LOW (ref 39.0–52.0)
Hemoglobin: 7.5 g/dL — ABNORMAL LOW (ref 13.0–17.0)
Hemoglobin: 7.3 g/dL — ABNORMAL LOW (ref 13.0–17.0)
MCH: 30.6 pg (ref 26.0–34.0)
MCH: 31.5 pg (ref 26.0–34.0)
MCHC: 30.4 g/dL (ref 30.0–36.0)
MCHC: 31.3 g/dL (ref 30.0–36.0)
MCV: 100.8 fL — ABNORMAL HIGH (ref 80.0–100.0)
MCV: 100.4 fL — ABNORMAL HIGH (ref 80.0–100.0)
Platelets: 194 10*3/uL (ref 150–400)
Platelets: 184 10*3/uL (ref 150–400)
RBC: 2.45 MIL/uL — ABNORMAL LOW (ref 4.22–5.81)
RBC: 2.32 MIL/uL — ABNORMAL LOW (ref 4.22–5.81)
RDW: 16.3 % — ABNORMAL HIGH (ref 11.5–15.5)
RDW: 16.7 % — ABNORMAL HIGH (ref 11.5–15.5)
WBC: 7.3 10*3/uL (ref 4.0–10.5)
WBC: 7.7 10*3/uL (ref 4.0–10.5)
nRBC: 0.4 % — ABNORMAL HIGH (ref 0.0–0.2)
nRBC: 0.4 % — ABNORMAL HIGH (ref 0.0–0.2)

## 2020-10-08 LAB — RENAL FUNCTION PANEL
Albumin: 2.3 g/dL — ABNORMAL LOW (ref 3.5–5.0)
Albumin: 2.4 g/dL — ABNORMAL LOW (ref 3.5–5.0)
Anion gap: 9 (ref 5–15)
Anion gap: 9 (ref 5–15)
BUN: 83 mg/dL — ABNORMAL HIGH (ref 6–20)
BUN: 87 mg/dL — ABNORMAL HIGH (ref 6–20)
CO2: 28 mmol/L (ref 22–32)
CO2: 31 mmol/L (ref 22–32)
Calcium: 8.8 mg/dL — ABNORMAL LOW (ref 8.9–10.3)
Calcium: 8.9 mg/dL (ref 8.9–10.3)
Chloride: 114 mmol/L — ABNORMAL HIGH (ref 98–111)
Chloride: 114 mmol/L — ABNORMAL HIGH (ref 98–111)
Creatinine, Ser: 1.09 mg/dL (ref 0.61–1.24)
Creatinine, Ser: 1.16 mg/dL (ref 0.61–1.24)
GFR, Estimated: >60 mL/min (ref >60)
GFR, Estimated: >60 mL/min (ref >60)
Glucose, Bld: 162 mg/dL — ABNORMAL HIGH (ref 70–99)
Glucose, Bld: 127 mg/dL — ABNORMAL HIGH (ref 70–99)
Phosphorus: 2.7 mg/dL (ref 2.5–4.6)
Phosphorus: 3.7 mg/dL (ref 2.5–4.6)
Potassium: 3.6 mmol/L (ref 3.5–5.1)
Potassium: 3.7 mmol/L (ref 3.5–5.1)
Sodium: 151 mmol/L — ABNORMAL HIGH (ref 135–145)
Sodium: 154 mmol/L — ABNORMAL HIGH (ref 135–145)

## 2020-10-08 LAB — GLUCOSE, CAPILLARY
Glucose-Capillary: 112 mg/dL — ABNORMAL HIGH (ref 70–99)
Glucose-Capillary: 142 mg/dL — ABNORMAL HIGH (ref 70–99)
Glucose-Capillary: 155 mg/dL — ABNORMAL HIGH (ref 70–99)
Glucose-Capillary: 156 mg/dL — ABNORMAL HIGH (ref 70–99)
Glucose-Capillary: 173 mg/dL — ABNORMAL HIGH (ref 70–99)
Glucose-Capillary: 83 mg/dL (ref 70–99)

## 2020-10-08 LAB — LACTIC ACID, PLASMA
Lactic Acid, Venous: 0.8 mmol/L (ref 0.5–1.9)
Lactic Acid, Venous: 0.6 mmol/L (ref 0.5–1.9)

## 2020-10-08 LAB — APTT
aPTT: 56 seconds — ABNORMAL HIGH (ref 24–36)
aPTT: 46 seconds — ABNORMAL HIGH (ref 24–36)

## 2020-10-08 LAB — LACTATE DEHYDROGENASE: LDH: 508 U/L — ABNORMAL HIGH (ref 98–192)

## 2020-10-08 LAB — PREPARE RBC (CROSSMATCH)

## 2020-10-08 LAB — MAGNESIUM: Magnesium: 2.7 mg/dL — ABNORMAL HIGH (ref 1.7–2.4)

## 2020-10-08 LAB — FIBRINOGEN: Fibrinogen: 319 mg/dL (ref 210–475)

## 2020-10-08 MED ORDER — FUROSEMIDE 10 MG/ML IJ SOLN
40.0000 mg | Freq: Every day | INTRAMUSCULAR | Status: DC
  Administered 2020-10-08 – 2020-10-10 (×3): 40 mg via INTRAVENOUS
  Filled 2020-10-08 (×3): qty 4

## 2020-10-08 MED ORDER — SODIUM CHLORIDE 3 % IN NEBU
4.0000 mL | INHALATION_SOLUTION | RESPIRATORY_TRACT | Status: DC
  Administered 2020-10-08 – 2020-10-12 (×24): 4 mL via RESPIRATORY_TRACT
  Filled 2020-10-08 (×25): qty 4

## 2020-10-08 MED ORDER — ALBUTEROL SULFATE (2.5 MG/3ML) 0.083% IN NEBU
2.5000 mg | INHALATION_SOLUTION | RESPIRATORY_TRACT | Status: DC
  Administered 2020-10-08 – 2020-10-12 (×24): 2.5 mg via RESPIRATORY_TRACT
  Filled 2020-10-08 (×24): qty 3

## 2020-10-08 MED ORDER — FREE WATER
200.0000 mL | Status: DC
  Administered 2020-10-08 – 2020-10-12 (×45): 200 mL

## 2020-10-08 MED ORDER — METOCLOPRAMIDE HCL 5 MG/ML IJ SOLN
5.0000 mg | Freq: Two times a day (BID) | INTRAMUSCULAR | Status: DC
  Administered 2020-10-08 – 2020-10-12 (×9): 5 mg via INTRAVENOUS
  Filled 2020-10-08 (×9): qty 2

## 2020-10-08 MED ORDER — SODIUM CHLORIDE 0.9% IV SOLUTION: Freq: Once | INTRAVENOUS | Status: AC

## 2020-10-08 MED ORDER — LIDOCAINE-EPINEPHRINE 1 %-1:100000 IJ SOLN
20.0000 mL | Freq: Once | INTRAMUSCULAR | Status: AC
  Administered 2020-10-08: 20 mL via INTRADERMAL

## 2020-10-08 MED ORDER — POTASSIUM CHLORIDE 20 MEQ PO PACK
40.0000 meq | PACK | Freq: Once | ORAL | Status: AC
  Administered 2020-10-08: 40 meq
  Filled 2020-10-08: qty 2

## 2020-10-08 NOTE — Progress Notes (Signed)
ANTICOAGULATION CONSULT NOTE  Pharmacy Consult for bivalirudin Indication: ECMO + bilateral DVTs + [facility] 12/26   Recent Labs    10/06/20 0317 10/06/20 0515 10/07/20 1632 10/07/20 1638 10/08/20 0438 10/08/20 0519 10/08/20 1349 10/08/20 1502 10/08/20 1624  HGB 8.6*   < > 8.0*   < > 7.5*   < > 7.5* 7.1* 7.3*  HCT 26.7*   < > 26.7*   < > 24.7*   < > 22.0* 21.0* 23.3*  PLT 183   < > 185  --  194  --   --   --  184  APTT 53*   < > 59*  --  56*  --   --   --  46*  LABPROT 17.5*  --   --   --   --   --   --   --   --   INR 1.5*  --   --   --   --   --   --   --   --   CREATININE 1.13   < > 1.10  --  1.09  --   --   --  1.16   < > = values in this interval not displayed.    Estimated Creatinine Clearance: 78.4 mL/min (by C-G formula based on SCr of 1.16 mg/dL).   Assessment: 59 yom unvaccinated presenting with severe COVID ARDs, intubated on 12/6, continued to have ventilation issues despite NMB and proning - started on VV ECMO on 12/8. Pt noted to have bilateral DVTs 12/8. Bivalirudin started for ECMO anticoagulation. Head CT 12/26 with new small SAH, repeat 12/27 stable without expansion. Repeat heat CT 1/5 SAH improved.   APTT now subtherapeutic at 46 sec tonight on bivalirudin@0 .05 mg/kg/hr. Hgb down 7.3, pltc stable 150-200. No visible/obvious bleeding per discussion w/ RN, circuit stable. LDH/fibrinogen stable w/ AM labs.  Goal of Therapy:  aPTT 50-70 seconds  Monitor platelets by anticoagulation protocol: Yes   Plan:  Increase bivalirudin to 0.06 mg/kg/hr Recheck aPTTs every 5a & 5p with ECMO labs.  Sheppard Coil PharmD., BCPS Clinical Pharmacist 10/08/2020 6:37 PM   Please check [email or web address] for unit-specific pharmacy phone numbers.

## 2020-10-08 NOTE — Progress Notes (Signed)
RT changed pt's aline dressing and bag. RT noticed blood clot at hub site. RT did not remove blood clot, in case of excessive bleeding. RN was notified.

## 2020-10-08 NOTE — Progress Notes (Signed)
Assisted tele visit to patient with daughter.  Lyra Alaimo R, RN  

## 2020-10-08 NOTE — Progress Notes (Signed)
   Palliative Medicine Inpatient Follow Up Note  Reason for consult:  "ECMO"  HPI:  Per intake H&P --> Donald Rowe a 59 y.o.malewith a pertinent history ofhypothyroidism on levothyroxine and hyperlipidemia who was diagnosed with Covid 1 week ago and is not vaccinated. With EMS he was seen to be desaturating to 78% on room air. He states he started having symptoms on Friday and tested positive on Saturday.  Had initially elected to be DNAR without intubation though later changed this and got intubated on 12/6  in the setting of worsening respiratory failure with hypoxia. CTA chest w/ diffuse bilateral infiltrates. On 12/8 was cannulated for VV ECMO. Had been extubated on 12/9 then emergently reintubated in early evening due to clinical instability.   Palliative care was asked to get involved in the setting of serious illness/ ECMO to set goals and expectations   Today's Discussion (10/08/2020): Chart reviewed.  Attended ECMO rounds this morning.  Patient continues to have complicated ICU associated delirium. Some changes were made to clonazepam dosing frequency on 1/7 which has improved patients level of alertness. Off fentanyl and on low dose precedex. SAH remains stable (CT head 1/5). On BS abx while awaiting cultures from bronchoscopy (completed on 1/5). Labile HTN continues though Cardiology,  Intensivist, and pharmacy are carefully balancing medications in regards to this. Cariology reduced lasix to maintain patient as net even.  Ongoing discussions related to PEG placement amongst specialists will resume this oncoming week. PT/OT support will be ongoing to better optimize ROM and physical function. Speech therapy to work with patient this upcoming week.   Upon assessment patient is able to open his eyes. He is able to smile when I provide information to him ad he does nod for me today appropriately.   Donald Rowe was called over the phone and provided with an update. She plans on  coming to bedside this morning.   I will again ask SW to provide patients family with grief counseling resources.   Questions and concerns addressed  SUMMARY OF RECOMMENDATIONS Full Code/Full Scope of Care  ECMO as a bridge to recovery  Spiritual support appreciated  Social Work -appreciate providing resources to family on grief counseling  Ongoing PMT support    Time Spent: 25 Greater than 50% of the time was spent in counseling and coordination of care ______________________________________________________________________________________ Donald Lulas, NP [facility] Palliative Medicine Team Team Cell Phone: [phone] Please utilize secure chat with additional questions, if there is no response within 30 minutes please call the above phone number  Palliative Medicine Team providers are available by phone from 7am to 7pm daily and can be reached through the team cell phone.  Should this patient require assistance outside of these hours, please call the patient's attending physician.

## 2020-10-08 NOTE — Progress Notes (Signed)
Family camera in/tele-visit via elink

## 2020-10-08 NOTE — Procedures (Signed)
Extracorporeal support note     ECLS cannulation: 09/28/2020 Last circuit change: 09/11/2020  Indication: COVID ARDS   Configuration: VV, RIJ 32Fr [place]   Pump speed: 3400 Pump flow: 4.3 L Pump used: Cardiohelp   Sweep gas: 100%, 9 LPM   Circuit check: minimal clot Anticoagulant: bivalirudin   Changes in support: Con't aggressive care. Con't antibiotics for MRSA pneumonia. PTT goal 50-70 given previous SAH. Minimize IV infusions, con't with scheduled and PRN agitaiton meds. Needs PEG tube given prolonged course of critical illness expected- will revisit with IR next week. Does well with mobility from encephalopathy standpoint, but very weak.  anticipated goals/duration of support: Bridge to recovery, day 32 of support.  Steffanie Dunn, DO 10/08/20 8:43 AM [facility]

## 2020-10-08 NOTE — Progress Notes (Signed)
NAME:  Donald Rowe, MRN:  [id number], DOB:  12-17-1960, LOS: 76 ADMISSION DATE:  09/08/2020, CONSULTATION DATE:  12/6 REFERRING MD:  Dr. Aileen Rowe, CHIEF COMPLAINT:  SOB    Brief History   60 y/o M admitted 12/4 with 1 week hx of SOB, known COVID positive.  He is unvaccinated.  CTA chest negative for PE but demonstrated diffuse bilateral infiltrates. [place] started on 12/8   Past Medical History  Hypothyroidism  [facility] Events/Procedures  12/04 Admit  12/06 PCCM consulted  12/07 Intubated, central line, a line 12/08 VV ECMO Cannulaton, cortrak 12/13 circuit change for hemolytic anemia 12/16 Tracheostomy  12/17 bronch w/ BAL  Consults:  PCCM, Heart failure, TCTS, ECMO  Significant Diagnostic Tests:   CTA Chest 12/4 >> extensive bilateral airspace disease, no large PE identified, limited study   LE Venous Duplex 12/4 >> negative for DVT bilaterally   LE venous duplex 12/8> BLE DVTs  RUE venous duplex 12/23  RUE arterial duplex 12/23  CT head 12/26 : Minimal SAH on left side  CT abdomen and pelvis 12/26: Extensive bilateral airspace disease with small pleural effusion  CT head 12/27: Subarachnoid unchanged  Chest x-ray 1/1: Significant improvement in airspace disease  Micro Data:  COVID 12/4 >> negative  Influenza A/B 12/4 >> negative  MRSA PCR 12/5 >> negative  BCx2 12/4 >> NG Trach aspirate 12/12> rare MRSA BAL 12/17> MRSA, candida Aspergillus Ag BAL 12/17> 0.05 12/18 sars2: positive  Antimicrobials:  Azithromycin 12/5 >> 12/6  Ceftriaxone 12/5 >> 12/6  Cefepime 12/7>12/14 vanc 12/14>12/20 Meropenem 12/17>12/21  Interim history/subjective:  Increasingly able to communicate, although not always tracking. Some cuff leak this morning.  Objective   Blood pressure (!) 170/72, pulse (!) 114, temperature 97.6 F (36.4 C), temperature source Axillary, resp. rate (!) 32, height _0  (1.753 m), weight 95.9 kg, SpO2 99 %.    Vent Mode:  PCV FiO2 (%):  [50 %] 50 % Set Rate:  [10 bmp] 10 bmp PEEP:  [10 cmH20] 10 cmH20 Plateau Pressure:  [24 cmH20-30 cmH20] 24 cmH20   Intake/Output Summary (Last 24 hours) at 10/08/2020 0727 Last data filed at 10/08/2020 0700 Gross per 24 hour  Intake 2619.2 ml  Output 4635 ml  Net -2015.8 ml   Filed Weights   10/06/20 0556 10/07/20 0500 10/08/20 0645  Weight: 96 kg 99.9 kg 95.9 kg    Examination: Gen: critically ill appearing man laying in bed in NAD, appears stated age 60: [place]/AT, eyes anicteric Neck: trach w/o bleeding  CV: Tachycardic, regular rhythm Pulm: Bilateral rales improving, tidal volumes around 250 cc. Tachypneic, no increased work of breathing. Abd: soft, NT, ND. Extm: no cyanosis or edema, compression stockings in place Skin: warm & dry, pallor. No rashes. Neuro: RASS -2, follows commands but short attention span, answers some y/n questions.  CXR personally reviewed> persistent bilateral airspace disease, worst in left base, slowly clearing in LUL & R lung   Resolved Hospital Problem list     Assessment & Plan:   Acute hypoxemic and hypercarbic respiratory failure secondary to COVID PNA with severe ARDS. S/p VV ECMO cannulation 09/14/2020.  S/p tracheostomy 27/78 complicated by trach site bleeding improved Trivial left-sided subarachnoid hemorrhage Community-acquired pneumonia with MRSA- completed treatment. LLL HAP-staph aureus -Completed remdesivir, solumedrol, and baricitnib. -Con't full ECMO support, weaning sweep as tolerated.  -Completed treatment with 4 sessions of Plex for hemolysis -Decrease Furosemide once daily for goal euvolemia  -Completed 7 days vanc for  MRSA pneumonia. Now on vanc for MRSA pneumonia. Stop cefepime today.  Agitated delirium requiring titration of IV sedation.  Periods of severe agitation are limiting ability to wean ECMO -minimize infusions> decrease max dose of fentanyl -Con't enteral seroquel, klonopin- decreasing TDD slowly,  will hold currently 0.52m TID schedule for a few days -delirium remains main limitation to rehabilitation at this point, but improved significantly in the past 3 days  Bilateral lower ext DVTs- provoked by COVID infection -Continue bivalirudin; goal PTT 50-70 -needs 3 months AC for provoked DVT   Steroid-induced hyperglycemia- remains controlled  -SSI PRN -goal BG 140-180  Hypothyroidism -con't PTA synthroid   Labile hypertension, contributed by periods of agitation.  Hypotension after meds. Not needing pressors or cardene at this point. -space out sedating meds and antihypertensives to avoid hypotension -con't clonidine, amlodipine, and metoprolol -agitation control helps moderate BP -Decreased max dose on fentanyl infusion; need to minimize as much as possible.  Hemolytic anemia - improved. More expected down trending of Hb with expected losses associated with ECMO Throbocytopenia Hyperbilirubinemia Rheumatologic-associated MAHA remains in differential.  hgb and plt are stable -Completed treatment with the Plex for 4 sessions -Transfuse if hgb <7.5. Recheck later.  Vomiting- resolved -con't reglan; decrease to 553mQ12h -TF at goal -ok to proceed with PEG at any point  [facility] in [facility]> resolved on follow up imaging -PTT goals 50-70  Hypernatremia -increase FWF again -con't to monitor  Deconditioning -Long road of rehab ahead, improving mentation will allow for more participation with therapy and higher level mobility. -Ok for PEG tube. May help delirium to get NGT out.  Best practice (evaluated daily)  Diet: TF Pain/Anxiety/Delirium protocol (if indicated): PAD protocol, limit IV meds VAP protocol (if indicated): yes DVT prophylaxis: Bivalirudin GI prophylaxis: pantoprazole BID Glucose control: SSI Mobility: As tolerated Last date of multidisciplinary goals of care discussion: 10/07/19 with wife Donald Rowe at bedside, RN, ECMO specailist Summary of  discussion con't aggressive care Follow up goals of care discussion due: 1/14 Code Status: Full Disposition: ICU  This patient is critically ill with multiple organ system failure which requires frequent high complexity decision making, assessment, support, evaluation, and titration of therapies. This was completed through the application of advanced monitoring technologies and extensive interpretation of multiple databases. During this encounter critical care time was devoted to patient care services described in this note for 49 minutes. Multidisciplinary ECMO rounds completed.   LaJulian HyDO 10/08/20 8:41 AM [facility]

## 2020-10-08 NOTE — Progress Notes (Signed)
Patient ID: Donald Rowe, male   DOB: 05-26-1961, 60 y.o.   MRN: [id number]    Advanced Heart Failure Rounding Note   Subjective:    12/8 Cannulated for VV ECMO - 32 FR  RIJ [place] 12/9 Extubated/Reintubated for altered mental status 12/13 Circuit change for elevated LDH 12/15 PLEX started 12/16 Tracheostomy 12/23 head CT (normal) for change in mental status.  12/26 Pan CT for drop in hgb. Small SAH. Severe lung disease 12/27 CVVH begun 1/1 CVVH stopped  Off Fentanyl, only getting dexmedetomidine.  Now with purposeful movement, awake this morning.   SBP 150s, currently off nicardipine.  Getting clonidine, amlodipine, and metoprolol.  I/Os negative 2 L with weight down.   Trach aspirate with MRSA, vancomycin/cefepime restarted.  CXR unchanged this morning.   Had PLEX 4/4 completed 12/22.   Na 153 => 151, getting free water.    ECMO   Speed 3400 Flow 4.38 L Sweep 9 dP 23 pVen -60  ABG: 7.37/53/79/95% Hgb 7.5 LDH 434 -> 544 -> 419 -> 551 -> 579 -> 590 -> 438 -> 518 -> 488 -> 514 -> 498 -> 519 -> 508 Lactic acid 0.8 PTT 56  Vent 50%   Objective:   Weight Range:  Vital Signs:   Temp:  [97.6 F (36.4 C)-98.6 F (37 C)] 97.6 F (36.4 C) (01/09 0400) Pulse Rate:  [80-133] 114 (01/09 0700) Resp:  [17-51] 32 (01/09 0700) BP: (110-185)/(46-73) 170/72 (01/09 0500) SpO2:  [92 %-99 %] 99 % (01/09 0700) Arterial Line BP: (91-193)/(43-74) 172/71 (01/09 0700) FiO2 (%):  [50 %] 50 % (01/09 0259) Weight:  [95.9 kg] 95.9 kg (01/09 0645) Last BM Date: 10/07/20  Weight change: Filed Weights   10/06/20 0556 10/07/20 0500 10/08/20 0645  Weight: 96 kg 99.9 kg 95.9 kg    Intake/Output:   Intake/Output Summary (Last 24 hours) at 10/08/2020 0752 Last data filed at 10/08/2020 0700 Gross per 24 hour  Intake 2619.2 ml  Output 4635 ml  Net -2015.8 ml     Physical Exam: General: NAD Neck: Tracheostomy. No JVD, no thyromegaly or thyroid nodule.  Lungs: Clear to  auscultation bilaterally with normal respiratory effort. CV: Nondisplaced PMI.  Heart regular S1/S2, no S3/S4, no murmur.  No peripheral edema.   Abdomen: Soft, nontender, no hepatosplenomegaly, no distention.  Skin: Intact without lesions or rashes.  Neurologic: Will follow commands.  Extremities: No clubbing or cyanosis.  HEENT: Normal.   Telemetry: sinus 100s Personally reviewed    Labs: Basic Metabolic Panel: Recent Labs  Lab 10/04/20 0403 10/04/20 1623 10/05/20 0346 10/05/20 0636 10/06/20 3710 10/06/20 0515 10/06/20 1613 10/06/20 1615 10/07/20 6269 10/07/20 4854 10/07/20 1632 10/07/20 1638 10/07/20 1955 10/08/20 0438 10/08/20 0519  NA 141   < > 144  148*   < > 148*   < > 150*   < > 153*   < > 153* 155* 156* 151* 156*  K 3.8   < > 4.8  4.3   < > 3.6   < > 4.7   < > 4.3   < > 4.3 4.3 3.4* 3.6 3.6  CL 111   < > 108   < > 111  --  113*  --  115*  --  115*  --   --  114*  --   CO2 24   < > 29   < > 28  --  29  --  29  --  30  --   --  28  --  GLUCOSE 102*   < > 133*   < > 162*  --  152*  --  137*  --  146*  --   --  162*  --   BUN 53*   < > 63*   < > 64*  --  69*  --  73*  --  79*  --   --  83*  --   CREATININE 0.99   < > 1.14   < > 1.13  --  1.11  --  1.08  --  1.10  --   --  1.09  --   CALCIUM 7.7*   < > 8.9   < > 9.0  --  9.2  --  9.3  --  9.0  --   --  8.8*  --   MG 2.0  --  2.2  --  2.4  --   --   --  2.8*  --   --   --   --  2.7*  --   PHOS 3.2   < > 2.8   < > 3.4  --  2.9  --  2.6  --  2.7  --   --  2.7  --    < > = values in this interval not displayed.    Liver Function Tests: Recent Labs  Lab 10/02/20 0401 10/02/20 1600 10/06/20 0317 10/06/20 1613 10/07/20 0314 10/07/20 1632 10/08/20 0438  AST 48*  --   --   --   --   --   --   ALT 46*  --   --   --   --   --   --   ALKPHOS 141*  --   --   --   --   --   --   BILITOT 1.1  --   --   --   --   --   --   PROT 5.9*  --   --   --   --   --   --   ALBUMIN 3.0*  3.0*   < > 2.4* 2.3* 2.5* 2.4* 2.3*    < > = values in this interval not displayed.   No results for input(s): LIPASE, AMYLASE in the last 168 hours. No results for input(s): AMMONIA in the last 168 hours.  CBC: Recent Labs  Lab 10/06/20 0317 10/06/20 0515 10/06/20 1613 10/06/20 1615 10/07/20 0314 10/07/20 0516 10/07/20 1632 10/07/20 1638 10/07/20 1955 10/08/20 0438 10/08/20 0519  WBC 6.9  --  6.3  --  7.0  --  7.1  --   --  7.3  --   HGB 8.6*   < > 8.0*   < > 8.1*   < > 8.0* 7.8* 6.5* 7.5* 7.5*  HCT 26.7*   < > 26.5*   < > 25.8*   < > 26.7* 23.0* 19.0* 24.7* 22.0*  MCV 98.9  --  100.4*  --  99.6  --  101.1*  --   --  100.8*  --   PLT 183  --  180  --  183  --  185  --   --  194  --    < > = values in this interval not displayed.    Cardiac Enzymes: No results for input(s): CKTOTAL, CKMB, CKMBINDEX, TROPONINI in the last 168 hours.  BNP: BNP (last 3 results) No results for input(s): BNP in the last 8760 hours.  ProBNP (last 3 results) No results for  input(s): PROBNP in the last 8760 hours.    Other results:  Imaging: DG CHEST PORT 1 VIEW  Result Date: 10/07/2020 CLINICAL DATA:  Personal history of ECMO EXAM: PORTABLE CHEST 1 VIEW COMPARISON:  Yesterday FINDINGS: ECMO catheter in similar position. Unremarkable positioning of left PICC, tracheostomy tube, and feeding tube. Unchanged confluent pneumonia. No visible effusion or pneumothorax. Cardiomegaly. IMPRESSION: Stable hardware positioning and bilateral pneumonia. Electronically Signed   By: Monte Fantasia M.D.   On: 10/07/2020 07:37     Medications:     Scheduled Medications: . albuterol  2.5 mg Nebulization Q6H  . amLODipine  10 mg Per Tube q1800  . chlorhexidine gluconate (MEDLINE KIT)  15 mL Mouth Rinse BID  . Chlorhexidine Gluconate Cloth  6 each Topical Daily  . clonazePAM  0.5 mg Per Tube Q8H  . cloNIDine  0.1 mg Per Tube TID  . docusate  100 mg Per Tube BID  . feeding supplement (PROSource TF)  45 mL Per Tube QID  . fiber  1 packet Per  Tube BID  . free water  200 mL Per Tube Q2H  . furosemide  40 mg Intravenous Daily  . insulin aspart  2-6 Units Subcutaneous Q4H  . levothyroxine  50 mcg Per Tube Q0600  . lidocaine  1 patch Transdermal Q24H  . mouth rinse  15 mL Mouth Rinse 10 times per day  . metoCLOPramide (REGLAN) injection  5 mg Intravenous Q8H  . metoprolol tartrate  50 mg Per Tube Q12H  . mupirocin ointment  1 application Nasal BID  . nystatin  5 mL Per Tube QID  . oxyCODONE  10 mg Per Tube Q4H  . pantoprazole sodium  40 mg Per Tube BID  . polyethylene glycol  17 g Per Tube BID  . pravastatin  40 mg Per Tube q1800  . QUEtiapine  150 mg Per Tube TID  . sodium chloride flush  10-40 mL Intracatheter Q12H  . sodium chloride HYPERTONIC  4 mL Nebulization QID    Infusions: . sodium chloride 10 mL/hr at 10/08/20 0700  . albumin human 12.5 g (09/29/20 1644)  . bivalirudin (ANGIOMAX) infusion 0.5 mg/mL (Non-ACS indications) 0.05 mg/kg/hr (10/08/20 0700)  . dexmedetomidine (PRECEDEX) IV infusion 1.2 mcg/kg/hr (10/08/20 0529)  . feeding supplement (PIVOT 1.5 CAL) 1,000 mL (10/08/20 0316)  . fentaNYL infusion INTRAVENOUS 0 mcg/hr (10/07/20 0913)  . niCARDipine Stopped (09/30/20 3570)  . vancomycin Stopped (10/07/20 2003)    PRN Medications: acetaminophen (TYLENOL) oral liquid 160 mg/5 mL, albumin human, albuterol, diphenhydrAMINE, fentaNYL, Gerhardt's butt cream, guaiFENesin-dextromethorphan, haloperidol lactate, labetalol, lip balm, LORazepam, midazolam, ondansetron **OR** ondansetron (ZOFRAN) IV, sodium chloride flush   Assessment/Plan:   1. Acute hypoxic respiratory failure/ARDS in setting of COVID-19 PNA - Cannulated for VV ECMO on 12/8 after failing intubation x 2 days - has complete remedisivir, baricitinib, Solumedrol.  - s/p trach 12/16 - CT chest 12/26 with severe lung disease and pneumomediastinum.  - Restarted vanc/cefepime 1/7 with Staph in trach aspirate.  - Circuit changed 12/13 for elevated LDH  and lower flows. PLEX started 12/15. S/p PLEX #4/4 on 12/21. LDH has been stable. Circuit parameters stable.   - Sweep stable at 9 today, will try to start weaning down.     - CVVH started 12/27 with intractable volume overload, stopped 1/1. Creatinine stable but BUN high. I/Os 2 L negative yesterday.  Will decrease Lasix to 40 mg IV daily today.  - Bivalirudin with PTT goal back up to 50-70.  -  Mental status seems to be improving. Now off propofol with purposeful movement.    2. Bilateral DVT - remains on bivalirudin - Dosing d/w PharmD, increased PTT goal back to 50-70.   3. DM2 - Insulin adjusted. CBGs improved   4. Obesity - Body mass index is 31.22 kg/m.  5. F/E/N - Restarted tube feeds, ileus improved.   6. HTN - Remains very labile  - Now off nicardipine and getting amlodipine, clonidine, and metoprolol.   7. Rectal bleeding - follow closely. Rectal tube removed  8. AKI - Now off CVVH, creatinine so far stable.   9. Acute encephalopathy - remains agitated  - head CT ok 09/07/20 and 12/21 - head CT 12/26. Small SAH.  Repeat CT on 12/27 with unchanged small SAH.   10. Small SAH - on CT 12/26 - repeat CT 12/27 unchanged small SAH.  - PTT goal reduced 40-50 for several days, plan to go back to 50-70 today.  - avoid severe HTN as best as possible  11. Gastric ileus - Getting Reglan IV - Seems to be resolving.  - Trickle feeds restarted.    12. Thrombocytopenia - Stable, mild.   13. Anemia - Transfuse hgb < 7.5.  - Has been getting periodic blood without overt GI bleeding.   Plan discussed on multi-discplinary ECMO rounds with CCM, ECMO coordinator/specialist, PharmDs and RNs.  CRITICAL CARE Performed by: Loralie Champagne  Total critical care time: 40 minutes  Critical care time was exclusive of separately billable procedures and treating other patients.  Critical care was necessary to treat or prevent imminent or life-threatening  deterioration.  Critical care was time spent personally by me (independent of midlevel providers or residents) on the following activities: development of treatment plan with patient and/or surrogate as well as nursing, discussions with consultants, evaluation of patient's response to treatment, examination of patient, obtaining history from patient or surrogate, ordering and performing treatments and interventions, ordering and review of laboratory studies, ordering and review of radiographic studies, pulse oximetry and re-evaluation of patient's condition.    Length of Stay: 33   Loralie Champagne MD 10/08/2020, 7:52 AM  Advanced Heart Failure Team Pager [phone] (M-F; 7a - 4p)  Please contact [facility] for night-coverage after hours (4p -7a ) and weekends on [email or web address]

## 2020-10-08 NOTE — Progress Notes (Signed)
ANTICOAGULATION CONSULT NOTE  Pharmacy Consult for bivalirudin Indication: ECMO + bilateral DVTs + [facility] 12/26   Recent Labs    10/06/20 0317 10/06/20 0515 10/07/20 0314 10/07/20 0516 10/07/20 1632 10/07/20 1638 10/07/20 1955 10/08/20 0438 10/08/20 0519  HGB 8.6*   < > 8.1*   < > 8.0*   < > 6.5* 7.5* 7.5*  HCT 26.7*   < > 25.8*   < > 26.7*   < > 19.0* 24.7* 22.0*  PLT 183   < > 183  --  185  --   --  194  --   APTT 53*   < > 49*  --  59*  --   --  56*  --   LABPROT 17.5*  --   --   --   --   --   --   --   --   INR 1.5*  --   --   --   --   --   --   --   --   CREATININE 1.13   < > 1.08  --  1.10  --   --  1.09  --    < > = values in this interval not displayed.    Estimated Creatinine Clearance: 83.4 mL/min (by C-G formula based on SCr of 1.09 mg/dL).   Assessment: 21 yom unvaccinated presenting with severe COVID ARDs, intubated on 12/6, continued to have ventilation issues despite NMB and proning - started on VV ECMO on 12/8. Pt noted to have bilateral DVTs 12/8. Bivalirudin started for ECMO anticoagulation. Head CT 12/26 with new small SAH, repeat 12/27 stable without expansion. Repeat heat CT 1/5 SAH improved.   APTT therapeutic at 56 sec at goal tonight on bivalirudin@0 .05 mg/kg/hr. Hgb down 7.5, pltc stable 150-200. No visible/obvious bleeding per discussion w/ RN, circuit stable. LDH/fibrinogen stable w/ AM labs.  Goal of Therapy:  aPTT 50-70 seconds  Monitor platelets by anticoagulation protocol: Yes   Plan:  Continue bivalirudin to 0.05 mg/kg/hr Recheck aPTTs every 5a & 5p with ECMO labs.  Tama Headings, PharmD, BCPS PGY2 Cardiology Pharmacy Resident Phone: [phone] 10/08/2020  7:19 AM  Please check [email or web address] for unit-specific pharmacy phone numbers.

## 2020-10-08 NOTE — Progress Notes (Addendum)
Pharmacy Antibiotic Note  Donald Rowe is a 60 y.o. male admitted on 09/09/2020 with COVID PNA requiring ECMO cannulation. Pt has been on several ABX courses, most recently restarted on vancomycin and cefepime. Cefepime stopped today. WBC wnl 7.3, remains afebrile. Scr stable at 1.09 making good UOP with IV furosemide.  Plan: Continue Vancomycin 1710m q24h Follow LOT, Cx, renal function Vancomycin levels as needed   Height: _0  (175.3 cm) Weight: 95.9 kg (211 lb 6.7 oz) IBW/kg (Calculated) : 70.7  Temp (24hrs), Avg:98.2 F (36.8 C), Min:97.6 F (36.4 C), Max:98.6 F (37 C)  Recent Labs  Lab 10/06/20 0317 10/06/20 1613 10/07/20 0314 10/07/20 1632 10/08/20 0438 10/08/20 0443  WBC 6.9 6.3 7.0 7.1 7.3  --   CREATININE 1.13 1.11 1.08 1.10 1.09  --   LATICACIDVEN 0.7 0.9 0.9 0.9  --  0.8    Estimated Creatinine Clearance: 83.4 mL/min (by C-G formula based on SCr of 1.09 mg/dL).    No Known Allergies  Antimicrobials this admission: 12/5 Ceftriaxone >> 12/6 12/5 Azith >> 12/6 12/7 Cefepime>> 12/14, 12/17>> 12/17; 12/26 >>1/1; 1/5 >>1/9 12/14 Vancomycin>>12/20; 12/26 >>1/1; 1/5 >> 12/17 Meropenem >>12/21  Cultures: 12/4 BCx x 2 - neg 12/12 TA: MRSA 12/17 BAL: MRSA + candida 12/26 TA: rare candida 12/26 BCx x 2: neg 1/4 Ucx: 10k yeast 1/5 BAL: MRSA 1/6 MRSA pcr +  CRichardine Service PharmD, BCPS PGY2 Cardiology Pharmacy Resident Phone: [phone]/05/2021  8:13 AM  Please check [email or web address] for unit-specific pharmacy phone numbers.

## 2020-10-08 NOTE — Progress Notes (Addendum)
RT at bedside to perform CPT on pt. RN stated that pt just became relaxed and comfortable. RT will hold off on CPT at this time and resume at next scheduled time.

## 2020-10-08 NOTE — Progress Notes (Signed)
CSW received consult for grief counseling resources for patients spouse. Patients spouse was not in room. CSW left resources with patients nurse to give to patients spouse when she returns.  CSW will continue to follow.

## 2020-10-09 ENCOUNTER — Inpatient Hospital Stay (HOSPITAL_COMMUNITY): Payer: HRSA Program

## 2020-10-09 DIAGNOSIS — U071 COVID-19: Secondary | ICD-10-CM | POA: Diagnosis not present

## 2020-10-09 DIAGNOSIS — J9601 Acute respiratory failure with hypoxia: Secondary | ICD-10-CM | POA: Diagnosis not present

## 2020-10-09 DIAGNOSIS — J1282 Pneumonia due to coronavirus disease 2019: Secondary | ICD-10-CM | POA: Diagnosis not present

## 2020-10-09 LAB — POCT I-STAT 7, (LYTES, BLD GAS, ICA,H+H)
Acid-Base Excess: 4 mmol/L — ABNORMAL HIGH (ref 0.0–2.0)
Acid-Base Excess: 5 mmol/L — ABNORMAL HIGH (ref 0.0–2.0)
Acid-Base Excess: 6 mmol/L — ABNORMAL HIGH (ref 0.0–2.0)
Acid-Base Excess: 7 mmol/L — ABNORMAL HIGH (ref 0.0–2.0)
Acid-Base Excess: 8 mmol/L — ABNORMAL HIGH (ref 0.0–2.0)
Bicarbonate: 32.2 mmol/L — ABNORMAL HIGH (ref 20.0–28.0)
Bicarbonate: 32.3 mmol/L — ABNORMAL HIGH (ref 20.0–28.0)
Bicarbonate: 32.7 mmol/L — ABNORMAL HIGH (ref 20.0–28.0)
Bicarbonate: 33 mmol/L — ABNORMAL HIGH (ref 20.0–28.0)
Bicarbonate: 34.6 mmol/L — ABNORMAL HIGH (ref 20.0–28.0)
Calcium, Ion: 1.33 mmol/L (ref 1.15–1.40)
Calcium, Ion: 1.35 mmol/L (ref 1.15–1.40)
Calcium, Ion: 1.36 mmol/L (ref 1.15–1.40)
Calcium, Ion: 1.36 mmol/L (ref 1.15–1.40)
Calcium, Ion: 1.37 mmol/L (ref 1.15–1.40)
HCT: 22 % — ABNORMAL LOW (ref 39.0–52.0)
HCT: 22 % — ABNORMAL LOW (ref 39.0–52.0)
HCT: 22 % — ABNORMAL LOW (ref 39.0–52.0)
HCT: 24 % — ABNORMAL LOW (ref 39.0–52.0)
HCT: 24 % — ABNORMAL LOW (ref 39.0–52.0)
Hemoglobin: 7.5 g/dL — ABNORMAL LOW (ref 13.0–17.0)
Hemoglobin: 7.5 g/dL — ABNORMAL LOW (ref 13.0–17.0)
Hemoglobin: 7.5 g/dL — ABNORMAL LOW (ref 13.0–17.0)
Hemoglobin: 8.2 g/dL — ABNORMAL LOW (ref 13.0–17.0)
Hemoglobin: 8.2 g/dL — ABNORMAL LOW (ref 13.0–17.0)
O2 Saturation: 90 %
O2 Saturation: 92 %
O2 Saturation: 93 %
O2 Saturation: 94 %
O2 Saturation: 94 %
Patient temperature: 36.8
Patient temperature: 36.8
Patient temperature: 36.9
Patient temperature: 36.9
Patient temperature: 99
Potassium: 3.8 mmol/L (ref 3.5–5.1)
Potassium: 4.1 mmol/L (ref 3.5–5.1)
Potassium: 4.1 mmol/L (ref 3.5–5.1)
Potassium: 4.2 mmol/L (ref 3.5–5.1)
Potassium: 4.2 mmol/L (ref 3.5–5.1)
Sodium: 154 mmol/L — ABNORMAL HIGH (ref 135–145)
Sodium: 154 mmol/L — ABNORMAL HIGH (ref 135–145)
Sodium: 154 mmol/L — ABNORMAL HIGH (ref 135–145)
Sodium: 155 mmol/L — ABNORMAL HIGH (ref 135–145)
Sodium: 155 mmol/L — ABNORMAL HIGH (ref 135–145)
TCO2: 34 mmol/L — ABNORMAL HIGH (ref 22–32)
TCO2: 34 mmol/L — ABNORMAL HIGH (ref 22–32)
TCO2: 34 mmol/L — ABNORMAL HIGH (ref 22–32)
TCO2: 35 mmol/L — ABNORMAL HIGH (ref 22–32)
TCO2: 37 mmol/L — ABNORMAL HIGH (ref 22–32)
pCO2 arterial: 56.9 mmHg — ABNORMAL HIGH (ref 32.0–48.0)
pCO2 arterial: 61.3 mmHg — ABNORMAL HIGH (ref 32.0–48.0)
pCO2 arterial: 61.7 mmHg — ABNORMAL HIGH (ref 32.0–48.0)
pCO2 arterial: 62.9 mmHg — ABNORMAL HIGH (ref 32.0–48.0)
pCO2 arterial: 69.4 mmHg (ref 32.0–48.0)
pH, Arterial: 7.273 — ABNORMAL LOW (ref 7.350–7.450)
pH, Arterial: 7.325 — ABNORMAL LOW (ref 7.350–7.450)
pH, Arterial: 7.34 — ABNORMAL LOW (ref 7.350–7.450)
pH, Arterial: 7.348 — ABNORMAL LOW (ref 7.350–7.450)
pH, Arterial: 7.367 (ref 7.350–7.450)
pO2, Arterial: 63 mmHg — ABNORMAL LOW (ref 83.0–108.0)
pO2, Arterial: 71 mmHg — ABNORMAL LOW (ref 83.0–108.0)
pO2, Arterial: 73 mmHg — ABNORMAL LOW (ref 83.0–108.0)
pO2, Arterial: 77 mmHg — ABNORMAL LOW (ref 83.0–108.0)
pO2, Arterial: 80 mmHg — ABNORMAL LOW (ref 83.0–108.0)

## 2020-10-09 LAB — CBC
HCT: 27.1 % — ABNORMAL LOW (ref 39.0–52.0)
HCT: 26.4 % — ABNORMAL LOW (ref 39.0–52.0)
Hemoglobin: 8.3 g/dL — ABNORMAL LOW (ref 13.0–17.0)
Hemoglobin: 8.6 g/dL — ABNORMAL LOW (ref 13.0–17.0)
MCH: 31.1 pg (ref 26.0–34.0)
MCH: 32.5 pg (ref 26.0–34.0)
MCHC: 30.6 g/dL (ref 30.0–36.0)
MCHC: 32.6 g/dL (ref 30.0–36.0)
MCV: 101.5 fL — ABNORMAL HIGH (ref 80.0–100.0)
MCV: 99.6 fL (ref 80.0–100.0)
Platelets: 187 10*3/uL (ref 150–400)
Platelets: 196 10*3/uL (ref 150–400)
RBC: 2.67 MIL/uL — ABNORMAL LOW (ref 4.22–5.81)
RBC: 2.65 MIL/uL — ABNORMAL LOW (ref 4.22–5.81)
RDW: 17.7 % — ABNORMAL HIGH (ref 11.5–15.5)
RDW: 17.6 % — ABNORMAL HIGH (ref 11.5–15.5)
WBC: 9 10*3/uL (ref 4.0–10.5)
WBC: 11.1 10*3/uL — ABNORMAL HIGH (ref 4.0–10.5)
nRBC: 0.4 % — ABNORMAL HIGH (ref 0.0–0.2)
nRBC: 0.5 % — ABNORMAL HIGH (ref 0.0–0.2)

## 2020-10-09 LAB — RENAL FUNCTION PANEL
Albumin: 2.4 g/dL — ABNORMAL LOW (ref 3.5–5.0)
Albumin: 2.5 g/dL — ABNORMAL LOW (ref 3.5–5.0)
Anion gap: 7 (ref 5–15)
Anion gap: 9 (ref 5–15)
BUN: 89 mg/dL — ABNORMAL HIGH (ref 6–20)
BUN: 90 mg/dL — ABNORMAL HIGH (ref 6–20)
CO2: 30 mmol/L (ref 22–32)
CO2: 31 mmol/L (ref 22–32)
Calcium: 8.9 mg/dL (ref 8.9–10.3)
Calcium: 9 mg/dL (ref 8.9–10.3)
Chloride: 116 mmol/L — ABNORMAL HIGH (ref 98–111)
Chloride: 111 mmol/L (ref 98–111)
Creatinine, Ser: 1.16 mg/dL (ref 0.61–1.24)
Creatinine, Ser: 1.12 mg/dL (ref 0.61–1.24)
GFR, Estimated: >60 mL/min (ref >60)
GFR, Estimated: >60 mL/min (ref >60)
Glucose, Bld: 161 mg/dL — ABNORMAL HIGH (ref 70–99)
Glucose, Bld: 126 mg/dL — ABNORMAL HIGH (ref 70–99)
Phosphorus: 3.5 mg/dL (ref 2.5–4.6)
Phosphorus: 3.6 mg/dL (ref 2.5–4.6)
Potassium: 4 mmol/L (ref 3.5–5.1)
Potassium: 3.9 mmol/L (ref 3.5–5.1)
Sodium: 153 mmol/L — ABNORMAL HIGH (ref 135–145)
Sodium: 151 mmol/L — ABNORMAL HIGH (ref 135–145)

## 2020-10-09 LAB — GLUCOSE, CAPILLARY
Glucose-Capillary: 118 mg/dL — ABNORMAL HIGH (ref 70–99)
Glucose-Capillary: 133 mg/dL — ABNORMAL HIGH (ref 70–99)
Glucose-Capillary: 143 mg/dL — ABNORMAL HIGH (ref 70–99)
Glucose-Capillary: 144 mg/dL — ABNORMAL HIGH (ref 70–99)
Glucose-Capillary: 146 mg/dL — ABNORMAL HIGH (ref 70–99)
Glucose-Capillary: 158 mg/dL — ABNORMAL HIGH (ref 70–99)

## 2020-10-09 LAB — LACTIC ACID, PLASMA
Lactic Acid, Venous: 0.8 mmol/L (ref 0.5–1.9)
Lactic Acid, Venous: 0.7 mmol/L (ref 0.5–1.9)

## 2020-10-09 LAB — FIBRINOGEN: Fibrinogen: 327 mg/dL (ref 210–475)

## 2020-10-09 LAB — MAGNESIUM: Magnesium: 2.8 mg/dL — ABNORMAL HIGH (ref 1.7–2.4)

## 2020-10-09 LAB — APTT
aPTT: 55 seconds — ABNORMAL HIGH (ref 24–36)
aPTT: 56 seconds — ABNORMAL HIGH (ref 24–36)

## 2020-10-09 LAB — LACTATE DEHYDROGENASE: LDH: 542 U/L — ABNORMAL HIGH (ref 98–192)

## 2020-10-09 LAB — VANCOMYCIN, TROUGH: Vancomycin Tr: 27 ug/mL (ref 15–20)

## 2020-10-09 MED ORDER — VANCOMYCIN HCL 1250 MG/250ML IV SOLN
1250.0000 mg | INTRAVENOUS | Status: DC
  Administered 2020-10-09 – 2020-10-11 (×3): 1250 mg via INTRAVENOUS
  Filled 2020-10-09 (×5): qty 250

## 2020-10-09 NOTE — Evaluation (Addendum)
[facility] Speaking Valve - Evaluation Patient Details  Name: Donald Rowe MRN: [id number] Date of Birth: 1961/07/31  Today's Date: 10/09/2020 Time: 1010-1034 SLP Time Calculation (min) (ACUTE ONLY): 24 min  Past Medical History: History reviewed. No pertinent past medical history. Past Surgical History:  Past Surgical History:  Procedure Laterality Date  . ECMO CANNULATION N/A 09/24/2020   Procedure: ECMO CANNULATION;  Surgeon: Dolores Patty, MD;  Location: [facility];  Service: Cardiovascular;  Laterality: N/A;   HPI: 60 y/o M admitted 12/4 with 1 week hx of SOB, known COVID positive.  He is unvaccinated.  CTA chest negative for PE but demonstrated diffuse bilateral infiltrates. VVECMO started on 12/8.  Tracheostomy on 12/16.      Assessment / Plan / Recommendation Clinical Impression  Initial PMSV evaluation, pt on Pressure control ventilation with pressure setting of 10cm H20 above 10 of PEEP. RR set at 10. Pt typically with about 300 cc tidal volumes at baseline. Pt on ECMO with stable vital signs initially. Pt sedated on Precedex drip but able to open eyes. RN stopped drip at beginning of session. RT decreased PEEP to 5, pt was repositioned and cuff slowly deflated with audible oral airflow and RT reported dramatic drop in exhaled volumes indicating patent upper airway. Minimal oral expctoration of secretions so RT provided in line suction prior to PMSV placed. When valve placed pt demonstrates loud involuntary phonation on each exhalation. RT attempted vent adjustments but adjustments were limited due to type of vent. RR very high at this time, pt intermittently gagging and HR very labile. Attempted to encourage slow breathing and controlled phonation efforts; pt appeared to attempt, but unsuccessful in efforts. Two RNs watching HR closely and when HR dropped to 30s valve removed, cuff inflated and pt returned to rest. RNs reported that pt has had vagal episodes during  coughing recently. Overall, pt unsuccessful with initial valve attempt. Would like to see pt more alert for better ability to control respiration and with more stable HR prior to further attempts. Will check in closely for readiness.  SLP Visit Diagnosis: Aphonia (R49.1) SLP Visit Diagnosis: Aphonia (R49.1)    SLP Assessment       Follow Up Recommendations       Frequency and Duration min 2x/week  2 weeks    PMSV Trial PMSV was placed for: 5 minutes Able to redirect subglottic air through upper airway: Yes Able to Attain Phonation: Yes Voice Quality: Hoarse Able to Expectorate Secretions: No Breath Support for Phonation: Severely decreased Intelligibility: Intelligibility reduced Word: 0-24% accurate Phrase: Not tested Sentence: Not tested Conversation: Not tested Respirations During Trial: (!) 50 SpO2 During Trial:  (ECMO) Pulse During Trial: 30 (ranged from 70-30) Behavior: Poor eye contact   Tracheostomy Tube       Vent Dependency  Vent Mode: PCV Set Rate: 10 bmp PEEP: 10 cmH20 FiO2 (%): 50 %    Cuff Deflation Trial  GO Harlon Ditty, MA CCC-SLP  Acute Rehabilitation Services Pager [phone] Office [phone]           Claudine Mouton 10/09/2020, 11:47 AM

## 2020-10-09 NOTE — Progress Notes (Signed)
In AM rounds decision was made to discontinue foley catheter with Q4 hour bladder scans. Patient has not had any urine output since discontinuing the foley catheter at 0800. Patient was bladder scanned and was retaining > 500 cc. CCM made aware and placed an order to replace foley catheter at this time.

## 2020-10-09 NOTE — Progress Notes (Signed)
Pharmacy Antibiotic Note  Donald Rowe is a 60 y.o. male admitted on 09/13/2020 with COVID PNA requiring ECMO cannulation.   The patient is currently on Vancomycin for MRSA PNA. A Vancomycin trough was drawn early and resulted as 27 mcg/ml (true trough likely 25-26 mcg/ml) which is supratherapeutic. Will reduce the dose. Noted stop date planned for 1/14.  Plan: - Reduce Vancomycin to 1250 mg IV every 24 hours - next dose at 2200 this evening - Will continue to follow renal function, culture results, LOT, and antibiotic de-escalation plans    Height: _0  (175.3 cm) Weight: 95.9 kg (211 lb 6.7 oz) IBW/kg (Calculated) : 70.7  Temp (24hrs), Avg:98.3 F (36.8 C), Min:97.9 F (36.6 C), Max:98.6 F (37 C)  Recent Labs  Lab 10/07/20 1632 10/08/20 0438 10/08/20 0443 10/08/20 1624 10/09/20 0414 10/09/20 1542  WBC 7.1 7.3  --  7.7 9.0 11.1*  CREATININE 1.10 1.09  --  1.16 1.16 1.12  LATICACIDVEN 0.9  --  0.8 0.6 0.8 0.7  VANCOTROUGH  --   --   --   --   --  27*    Estimated Creatinine Clearance: 81.2 mL/min (by C-G formula based on SCr of 1.12 mg/dL).    No Known Allergies  Antimicrobials this admission: 12/5 Ceftriaxone >> 12/6 12/5 Azith >> 12/6 12/7 Cefepime>> 12/14, 12/17>> 12/17; 12/26 >>1/1; 1/5 >>1/9 12/14 Vancomycin>>12/20; 12/26 >>1/1; 1/5 >> 12/17 Meropenem >>12/21  Cultures: 12/4 BCx x 2 - neg 12/12 TA: MRSA 12/17 BAL: MRSA + candida 12/26 TA: rare candida 12/26 BCx x 2: neg 1/4 Ucx: 10k yeast 1/5 BAL: MRSA 1/6 MRSA pcr +  Thank you for allowing pharmacy to be a part of this patient's care.  Alycia Rossetti, PharmD, BCPS Clinical Pharmacist Clinical phone for 10/09/2020: [phone] 10/09/2020 4:59 PM   **Pharmacist phone directory can now be found on [email or web address] ([email or web address]).  Listed under [place].  Marland Kitchen

## 2020-10-09 NOTE — Progress Notes (Signed)
Patient ID: Donald Rowe, male   DOB: [birth year], 60 y.o.   MRN: [id number]    Advanced Heart Failure Rounding Note   Subjective:    12/8 Cannulated for VV ECMO - 32 FR  RIJ [place] 12/9 Extubated/Reintubated for altered mental status 12/13 Circuit change for elevated LDH 12/15 PLEX started -> completed 12/22 12/16 Tracheostomy 12/23 head CT (normal) for change in mental status.  12/26 Pan CT for drop in hgb. Small SAH. Severe lung disease 12/27 CVVH begun 1/1 CVVH stopped 1/3 Foley placed for urinary retention 1/5  CT head and chest - resolution of SAH. Severe bilateral airspace disease, slightly improved. Pneumomediastinum resolved.    Remains very agitated but more responsive. Sweep back up to 4 -> 9 over night due to worsening acidosis. Remains on precedex + oral. BP still very labile.   Trach aspirate with MRSA, vancomycin/cefepime restarted.  CXR still with severe bilateral infiltrates. Personally reviewed  Remains on lasix 40 IV daily.  ECMO   Speed 3900 Flow 4.96 L Sweep 9 dP 28 pVen -82  ABG: 7.33/62/71/92% Hgb 8.3 LDH 434 -> 488 -> 514 -> 498 -> 519 -> 508 -> 542 Lactic acid 0.8 PTT 55  Vent 50% TV 240 cc  Objective:   Weight Range:  Vital Signs:   Temp:  [97.6 F (36.4 C)-98.2 F (36.8 C)] 98.2 F (36.8 C) (01/10 0000) Pulse Rate:  [80-135] 119 (01/10 0700) Resp:  [17-48] 48 (01/10 0700) BP: (156-174)/(55-66) 156/55 (01/10 0424) SpO2:  [87 %-100 %] 95 % (01/10 0700) Arterial Line BP: (90-201)/(40-73) 195/73 (01/10 0700) FiO2 (%):  [50 %] 50 % (01/10 0700) Last BM Date: 10/08/20  Weight change: Filed Weights   10/06/20 0556 10/07/20 0500 10/08/20 0645  Weight: 96 kg 99.9 kg 95.9 kg    Intake/Output:   Intake/Output Summary (Last 24 hours) at 10/09/2020 0748 Last data filed at 10/09/2020 0700 Gross per 24 hour  Intake 8455.63 ml  Output 3525 ml  Net 4930.63 ml     Physical Exam: General:  Awake on vent. Will move purposefully.  Tachypneic HEENT: normal + [place] Neck: supple. RIJ ECMO  + trach  Carotids 2+ bilat; no bruits. No lymphadenopathy or thryomegaly appreciated. Cor: PMI nondisplaced. Regular tachy  Lungs: coarse Abdomen: soft, nontender, nondistended. No hepatosplenomegaly. No bruits or masses. Good bowel sounds. Extremities: no cyanosis, clubbing, rash, edema Neuro: awake on vent    Telemetry: sinus 100s Personally reviewed    Labs: Basic Metabolic Panel: Recent Labs  Lab 10/05/20 0346 10/05/20 0636 10/06/20 0317 10/06/20 0515 10/07/20 0314 10/07/20 0516 10/07/20 1632 10/07/20 1638 10/08/20 0438 10/08/20 0519 10/08/20 1624 10/08/20 2012 10/08/20 2117 10/08/20 2258 10/09/20 0014 10/09/20 0145 10/09/20 0414  NA 144  148*   < > 148*   < > 153*   < > 153*   < > 151*   < > 154*   < > 154* 155* 154* 154* 153*  K 4.8  4.3   < > 3.6   < > 4.3   < > 4.3   < > 3.6   < > 3.7   < > 4.0 4.1 4.2 4.1 4.0  CL 108   < > 111   < > 115*  --  115*  --  114*  --  114*  --   --   --   --   --  116*  CO2 29   < > 28   < > 29  --  30  --  28  --  31  --   --   --   --   --  30  GLUCOSE 133*   < > 162*   < > 137*  --  146*  --  162*  --  127*  --   --   --   --   --  161*  BUN 63*   < > 64*   < > 73*  --  79*  --  83*  --  87*  --   --   --   --   --  89*  CREATININE 1.14   < > 1.13   < > 1.08  --  1.10  --  1.09  --  1.16  --   --   --   --   --  1.16  CALCIUM 8.9   < > 9.0   < > 9.3  --  9.0  --  8.8*  --  8.9  --   --   --   --   --  8.9  MG 2.2  --  2.4  --  2.8*  --   --   --  2.7*  --   --   --   --   --   --   --  2.8*  PHOS 2.8   < > 3.4   < > 2.6  --  2.7  --  2.7  --  3.7  --   --   --   --   --  3.5   < > = values in this interval not displayed.    Liver Function Tests: Recent Labs  Lab 10/07/20 0314 10/07/20 1632 10/08/20 0438 10/08/20 1624 10/09/20 0414  ALBUMIN 2.5* 2.4* 2.3* 2.4* 2.4*   No results for input(s): LIPASE, AMYLASE in the last 168 hours. No results for input(s):  AMMONIA in the last 168 hours.  CBC: Recent Labs  Lab 10/07/20 0314 10/07/20 0516 10/07/20 1632 10/07/20 1638 10/08/20 0438 10/08/20 0519 10/08/20 1624 10/08/20 2012 10/08/20 2117 10/08/20 2258 10/09/20 0014 10/09/20 0145 10/09/20 0414  WBC 7.0  --  7.1  --  7.3  --  7.7  --   --   --   --   --  9.0  HGB 8.1*   < > 8.0*   < > 7.5*   < > 7.3*   < > 7.8* 8.5* 8.2* 7.5* 8.3*  HCT 25.8*   < > 26.7*   < > 24.7*   < > 23.3*   < > 23.0* 25.0* 24.0* 22.0* 27.1*  MCV 99.6  --  101.1*  --  100.8*  --  100.4*  --   --   --   --   --  101.5*  PLT 183  --  185  --  194  --  184  --   --   --   --   --  187   < > = values in this interval not displayed.    Cardiac Enzymes: No results for input(s): CKTOTAL, CKMB, CKMBINDEX, TROPONINI in the last 168 hours.  BNP: BNP (last 3 results) No results for input(s): BNP in the last 8760 hours.  ProBNP (last 3 results) No results for input(s): PROBNP in the last 8760 hours.    Other results:  Imaging: DG CHEST PORT 1 VIEW  Result Date: 10/09/2020 CLINICAL DATA:  ECMO EXAM: PORTABLE CHEST 1 VIEW COMPARISON:  Yesterday FINDINGS: Tracheostomy  and feeding tubes in unremarkable in stable position. Unchanged ECMO catheter and left PICC position. Confluent bilateral pneumonia without interval change. Prominent heart size accentuated by fat based on CT. No visible effusion or air leak. IMPRESSION: Stable hardware positioning and confluent pneumonia. Electronically Signed   By: Monte Fantasia M.D.   On: 10/09/2020 06:19   DG CHEST PORT 1 VIEW  Result Date: 10/08/2020 CLINICAL DATA:  Echo EXAM: PORTABLE CHEST 1 VIEW COMPARISON:  October 07, 2020 FINDINGS: The cardiomediastinal silhouette is unchanged and obscured.ECMO cannulation support apparatus. The enteric tube courses through the chest to the abdomen beyond the field-of-view. Tracheostomy. LEFT upper extremity PICC tip terminates over the superior cavoatrial junction. No pleural effusion. No  pneumothorax. Diffuse heterogeneous opacities with a peripheral consolidative predominance, unchanged. Visualized abdomen is unremarkable. No acute osseous abnormality. IMPRESSION: 1.  Support apparatus as described above. 2. Diffuse heterogeneous opacities with a peripheral consolidative predominance, unchanged. Electronically Signed   By: Valentino Saxon MD   On: 10/08/2020 08:52     Medications:     Scheduled Medications: . albuterol  2.5 mg Nebulization Q4H  . amLODipine  10 mg Per Tube q1800  . chlorhexidine gluconate (MEDLINE KIT)  15 mL Mouth Rinse BID  . Chlorhexidine Gluconate Cloth  6 each Topical Daily  . clonazePAM  0.5 mg Per Tube Q8H  . cloNIDine  0.1 mg Per Tube TID  . docusate  100 mg Per Tube BID  . feeding supplement (PROSource TF)  45 mL Per Tube QID  . fiber  1 packet Per Tube BID  . free water  200 mL Per Tube Q2H  . furosemide  40 mg Intravenous Daily  . insulin aspart  2-6 Units Subcutaneous Q4H  . levothyroxine  50 mcg Per Tube Q0600  . lidocaine  1 patch Transdermal Q24H  . mouth rinse  15 mL Mouth Rinse 10 times per day  . metoCLOPramide (REGLAN) injection  5 mg Intravenous Q12H  . metoprolol tartrate  50 mg Per Tube Q12H  . mupirocin ointment  1 application Nasal BID  . nystatin  5 mL Per Tube QID  . oxyCODONE  10 mg Per Tube Q4H  . pantoprazole sodium  40 mg Per Tube BID  . polyethylene glycol  17 g Per Tube BID  . pravastatin  40 mg Per Tube q1800  . QUEtiapine  150 mg Per Tube TID  . sodium chloride flush  10-40 mL Intracatheter Q12H  . sodium chloride HYPERTONIC  4 mL Nebulization Q4H    Infusions: . sodium chloride 10 mL/hr at 10/09/20 0700  . albumin human 12.5 g (09/29/20 1644)  . bivalirudin (ANGIOMAX) infusion 0.5 mg/mL (Non-ACS indications) 0.06 mg/kg/hr (10/09/20 0700)  . dexmedetomidine (PRECEDEX) IV infusion 0.7 mcg/kg/hr (10/09/20 0700)  . feeding supplement (PIVOT 1.5 CAL) 1,000 mL (10/08/20 1853)  . fentaNYL infusion INTRAVENOUS  0 mcg/hr (10/07/20 0913)  . niCARDipine Stopped (09/30/20 3086)  . vancomycin Stopped (10/08/20 1918)    PRN Medications: acetaminophen (TYLENOL) oral liquid 160 mg/5 mL, albumin human, albuterol, diphenhydrAMINE, fentaNYL, Gerhardt's butt cream, guaiFENesin-dextromethorphan, haloperidol lactate, labetalol, lip balm, LORazepam, midazolam, ondansetron **OR** ondansetron (ZOFRAN) IV, sodium chloride flush   Assessment/Plan:   1. Acute hypoxic respiratory failure/ARDS in setting of COVID-19 PNA - Cannulated for VV ECMO on 12/8 after failing intubation x 2 days - has complete remedisivir, baricitinib, Solumedrol.  - s/p trach 12/16 - CT chest 12/26 with severe lung disease and pneumomediastinum.  - 1/5  CT head and  chest - resolution of SAH. Severe bilateral airspace disease, slightly improved. Pneumomediastinum resolved.  - Restarted vanc/cefepime 1/6 with Staph in trach aspirate. Getting chest PT  - Circuit changed 12/13 for elevated LDH and lower flows. PLEX started 12/15. S/p PLEX #4/4 on 12/21. LDH has been stable. Circuit parameters stable.   - Sweep stable at 9 today, will try to start weaning down.     - CVVH started 12/27 with intractable volume overload, stopped 1/1. Creatinine stable but BUN high. I/Os 2 L negative yesterday.  Continue  Lasix to 40 mg IV daily today.  - Bivalirudin with PTT goal back up to 50-70. Discussed dosing with PharmD personally. - Mental status seems improving. Now off propofol with purposeful movement.  But remains markedly weak and tachypneic with little improvement in respiratory status. I remain very concerned that he may not have the respiratory reserve to be liberated from ECM. D/w CCM will adjust vent settings to see what we can do to help. Continue chest PT.  2. Bilateral DVT - remains on bivalirudin - Dosing d/w PharmD, increased PTT goal back to 50-70.   3. DM2 - Insulin adjusted. CBGs improved   4. Obesity - Body mass index is 31.22  kg/m.  5. F/E/N - Ileus improved. Back on TFs - D/W IR possible feeding tube his week  6. HTN - Remains very labile   7. Rectal bleeding - follow closely. Rectal tube back in over the weekend for perfuse liquid stools  8. AKI - Now off CVVH, creatinine so far stable.   9. Acute encephalopathy - remains agitated  - head CT ok 09/07/20 and 12/21 - head CT 12/26. Small SAH.  Repeat CT on 12/27 with unchanged small SAH.   10. Small SAH - on CT 12/26 - repeat CT 12/27 unchanged small SAH.  - PTT goal reduced 40-50 for several days, plan to go back to 50-70 today.  - avoid severe HTN as best as possible  11. Gastric ileus - Getting Reglan IV. Resolved  12. Anemia - Transfuse hgb < 7.5.  - Has been getting periodic blood without overt GI bleeding.   13. Severe deconditioning/muscle weakness - PT has been limited due to agitation - Continue to try to mobilize as best as posible   Plan discussed on multi-discplinary ECMO rounds with CCM, ECMO coordinator/specialist, PharmDs and RNs.  CRITICAL CARE Performed by: Glori Bickers  Total critical care time: 45 minutes  Critical care time was exclusive of separately billable procedures and treating other patients.  Critical care was necessary to treat or prevent imminent or life-threatening deterioration.  Critical care was time spent personally by me (independent of midlevel providers or residents) on the following activities: development of treatment plan with patient and/or surrogate as well as nursing, discussions with consultants, evaluation of patient's response to treatment, examination of patient, obtaining history from patient or surrogate, ordering and performing treatments and interventions, ordering and review of laboratory studies, ordering and review of radiographic studies, pulse oximetry and re-evaluation of patient's condition.    Length of Stay: [place] MD 10/09/2020, 7:48 AM  Advanced  Heart Failure Team Pager [phone] (M-F; [place])  Please contact [facility] for night-coverage after hours (4p -7a ) and weekends on [email or web address]

## 2020-10-09 NOTE — Consult Note (Addendum)
Chief Complaint: Patient was seen in consultation today for percutaneous gastric tube placement Chief Complaint  Patient presents with  . Covid Positive  . Shortness of Breath  . Fever  . Generalized Body Aches  . Weakness   at the request of Dr Council Mechanic   Supervising Physician: Richarda Overlie  Patient Status: [facility] - In-pt  History of Present Illness: Donald Rowe is a 60 y.o. male   Hypoxemia; hypercarbic resp failure Adm 12/4; +Covid Intubated; + trach VV ECMO started 12/8 cortrak placed 09-16-20 Known SAH - minimal Left  Dysphagia Severe agitation-- not able to wean ECMO easily Delirium Bilat LE DVT--- on [facility] per CCM Deconditioning cortak in for 1 mo  MD requesting percutaneous gastric tube placement Approved with Dr Lowella Dandy He has discussed with PCCM Planned for 1/11 in IR 1000 am Will need coordination with ECMO team and anticoagulation Dr Gaynell Face aware    History reviewed. No pertinent past medical history.  Past Surgical History:  Procedure Laterality Date  . ECMO CANNULATION N/A 09-16-20   Procedure: ECMO CANNULATION;  Surgeon: Dolores Patty, MD;  Location: [facility] INVASIVE CV LAB;  Service: Cardiovascular;  Laterality: N/A;    Allergies: Patient has no known allergies.  Medications: Prior to Admission medications   Medication Sig Start Date End Date Taking? Authorizing Provider  aspirin 81 MG chewable tablet Chew 81 mg by mouth daily.   Yes [provider]  cholecalciferol (VITAMIN D) 25 MCG (1000 UNIT) tablet Take 1,000 Units by mouth daily.   Yes [provider]  HYDROcodone-homatropine (HYCODAN) 5-1.5 MG/5ML syrup Take 5 mLs by mouth every 4 (four) hours as needed for cough.  03/20/20  Yes [provider]  ibuprofen (ADVIL) 200 MG tablet Take 600-800 mg by mouth every 6 (six) hours as needed for fever or headache (pain).   Yes [provider]  levothyroxine (SYNTHROID) 50 MCG tablet Take 50 mcg by mouth  daily. 06/30/20  Yes [provider]  meloxicam (MOBIC) 15 MG tablet Take 15 mg by mouth daily. 06/27/20  Yes [provider]  Phenyleph-Diphenhyd-DM-APAP (THERAFLU SEVERE COLD & COUGH) Packet MISC Take 1 packet by mouth once.   Yes [provider]  Pitavastatin Calcium (LIVALO) 4 MG TABS Take 2 mg by mouth daily.   Yes [provider]     History reviewed. No pertinent family history.  Social History   Socioeconomic History  . Marital status: Single    Spouse name: Not on file  . Number of children: Not on file  . Years of education: Not on file  . Highest education level: Not on file  Occupational History  . Not on file  Tobacco Use  . Smoking status: Never Smoker  . Smokeless tobacco: Never Used  Substance and Sexual Activity  . Alcohol use: Not on file  . Drug use: Not on file  . Sexual activity: Not on file  Other Topics Concern  . Not on file  Social History Narrative  . Not on file   Social Determinants of Health   Financial Resource Strain: Not on file  Food Insecurity: Not on file  Transportation Needs: Not on file  Physical Activity: Not on file  Stress: Not on file  Social Connections: Not on file     Review of Systems: A 12 point ROS discussed and pertinent positives are indicated in the HPI above.  All other systems are negative.   Vital Signs: BP (!) 171/67   Pulse Marland Kitchen)  110   Temp 98.2 F (36.8 C) (Core (Comment))   Resp (!) 21   Ht 5\' 9"  (1.753 m)   Wt 211 lb 6.7 oz (95.9 kg)   SpO2 96%   BMI 31.22 kg/m   Physical Exam Vitals reviewed.  Pulmonary:     Comments: Vent/Trach Musculoskeletal:     Comments: No response no movement   Neurological:     Comments: No response     Imaging: DG Abd 1 View  Result Date: 09/30/2020 CLINICAL DATA:  ECMO, vomiting EXAM: ABDOMEN - 1 VIEW COMPARISON:  Chest radiograph from earlier today. 09/27/2020 abdominal radiograph FINDINGS: Interval removal of enteric tube.  Moderate gaseous distention of the stomach. Right common femoral central venous catheter terminates in the region of the right common iliac vein. No dilated small bowel loops. No evidence of pneumatosis or pneumoperitoneum. Mild colonic gas and stool. IMPRESSION: Interval removal of enteric tube. Moderate gaseous distention of the stomach. Nonobstructive bowel gas pattern. Electronically Signed   By: Delbert Phenix M.D.   On: 09/30/2020 09:06   CT HEAD WO CONTRAST  Result Date: 10/04/2020 CLINICAL DATA:  Subarachnoid hemorrhage. EXAM: CT HEAD WITHOUT CONTRAST TECHNIQUE: Contiguous axial images were obtained from the base of the skull through the vertex without intravenous contrast. COMPARISON:  September 25, 2020. FINDINGS: Brain: No evidence of acute infarction, hemorrhage, hydrocephalus, extra-axial collection or mass lesion/mass effect. Subarachnoid hemorrhage noted on prior exam is not visualized currently. Vascular: No hyperdense vessel or unexpected calcification. Skull: Normal. Negative for fracture or focal lesion. Sinuses/Orbits: No acute finding. Other: Fluid is noted in mastoid air cells bilaterally. IMPRESSION: Subarachnoid hemorrhage noted on prior exam is not visualized currently. No acute intracranial abnormality seen. Electronically Signed   By: Lupita Raider M.D.   On: 10/04/2020 10:03   CT HEAD WO CONTRAST  Result Date: 09/25/2020 CLINICAL DATA:  Subarachnoid hemorrhage follow-up. EXAM: CT HEAD WITHOUT CONTRAST TECHNIQUE: Contiguous axial images were obtained from the base of the skull through the vertex without intravenous contrast. COMPARISON:  CT head December 26 21. FINDINGS: Brain: No substantial change in small volume subarachnoid hemorrhage within the left sylvian fissure. No evidence of new hemorrhage. No evidence of acute large vascular territory infarct. No abnormal mass effect or midline shift. No hydrocephalus. No mass lesion. Vascular: Calcific atherosclerosis. No hyperdense  vessel identified. Skull: No acute fracture. Sinuses/Orbits: Frothy secretions and mucosal thickening the right sphenoid sinus. Other: Bilateral large mastoid effusions. IMPRESSION: 1. No substantial change in small volume subarachnoid hemorrhage within the left sylvian fissure. No evidence of new hemorrhage. 2. Bilateral large mastoid effusions. Electronically Signed   By: Feliberto Harts MD   On: 09/25/2020 10:05   CT HEAD WO CONTRAST  Result Date: 09/24/2020 CLINICAL DATA:  Altered mental status, currently on ECMO EXAM: CT HEAD WITHOUT CONTRAST TECHNIQUE: Contiguous axial images were obtained from the base of the skull through the vertex without intravenous contrast. COMPARISON:  09/07/2020 FINDINGS: Brain: Minimal subarachnoid hemorrhage is noted in the left sylvian fissure. This is new from the prior exam. No other areas of subarachnoid hemorrhage are noted. Vascular: No hyperdense vessel or unexpected calcification. Skull: Normal. Negative for fracture or focal lesion. Sinuses/Orbits: No acute finding. Other: Fluid is noted within the mastoid air cells bilaterally new from the prior exam. IMPRESSION: New subarachnoid hemorrhage on the left in the sylvian fissure. Mastoid air cell effusions bilaterally. Critical Value/emergent results were called by telephone at the time of interpretation on 09/24/2020 at 10:56 am  to Aline August, ECMO specialist who will relay findings to Dr. Arvilla Meres. Electronically Signed   By: Alcide Clever M.D.   On: 09/24/2020 10:57   CT CHEST WO CONTRAST  Result Date: 10/04/2020 CLINICAL DATA:  History of COVID-19 positivity with respiratory distress EXAM: CT CHEST WITHOUT CONTRAST TECHNIQUE: Multidetector CT imaging of the chest was performed following the standard protocol without IV contrast. COMPARISON:  CT from 09/24/2020, chest x-ray from earlier in the same day. FINDINGS: Cardiovascular: Thoracic aorta is well visualize without aneurysmal dilatation. No  significant cardiac enlargement is noted. Relative decreased attenuation of the cardiac blood pool is noted suggestive of underlying anemia. No dilatation of the pulmonary artery is seen. ECMO cannula is noted on the right extending into the IVC. Coronary calcifications are noted. Left-sided PICC line is noted in place. Mediastinum/Nodes: Thoracic inlet is within normal limits. Tracheostomy tube is noted in place. Gastric catheter extends into the stomach. No sizable hilar or mediastinal adenopathy is noted. The esophagus as visualized is within normal limits. Previously seen pneumomediastinum has resolved in the interval. Lungs/Pleura: Diffuse bilateral airspace opacity is again identified but slightly improved when compared with the prior exam. No sizable effusions are noted. Upper Abdomen: Visualized upper abdomen shows no acute abnormality. Musculoskeletal: No chest wall mass or suspicious bone lesions identified. IMPRESSION: Diffuse airspace opacities and edema consistent with the known history of ARDS and COVID-19 pneumonia. Improved aeration is noted when compared with the prior CT. Resolution of previously seen pneumomediastinum. Tubes and lines as described above. Electronically Signed   By: Alcide Clever M.D.   On: 10/04/2020 10:10   CT CHEST W CONTRAST  Result Date: 09/24/2020 CLINICAL DATA:  ECMO patient, acute drop in hemoglobin, hypotension, retroperitoneal hematoma suspected EXAM: CT CHEST, ABDOMEN, AND PELVIS WITH CONTRAST TECHNIQUE: Multidetector CT imaging of the chest, abdomen and pelvis was performed following the standard protocol during bolus administration of intravenous contrast. CONTRAST:  OMNIPAQUE IOHEXOL 300 MG/ML  SOLN COMPARISON:  None. FINDINGS: CT CHEST FINDINGS Cardiovascular: Right internal jugular approach veno venous ECMO cannula, tip positioned in the superior abdominal inferior vena cava. Left upper extremity PICC, tip positioned within the mid SVC. Normal heart size.  Left coronary artery calcifications. No pericardial effusion. Mediastinum/Nodes: No enlarged mediastinal, hilar, or axillary lymph nodes. Extensive pneumomediastinum. Tracheostomy. Thyroid and esophagus demonstrate no significant findings. Lungs/Pleura: Examination of the lungs is significantly limited by breath motion artifact. There is very extensive, dense heterogeneous airspace opacity and consolidation throughout the lungs with small bilateral pleural effusions. No significant pneumothorax. Musculoskeletal: No chest wall mass or suspicious bone lesions identified. There is subpectoral emphysema about the chest wall (series 3, image 17). CT ABDOMEN PELVIS FINDINGS Hepatobiliary: No solid liver abnormality is seen. No gallstones, gallbladder wall thickening, or biliary dilatation. Pancreas: Unremarkable. No pancreatic ductal dilatation or surrounding inflammatory changes. Spleen: Normal in size without significant abnormality. Adrenals/Urinary Tract: Adrenal glands are unremarkable. Kidneys are normal, without renal calculi, solid lesion, or hydronephrosis. Foley catheter within the urinary bladder. Stomach/Bowel: Stomach is within normal limits. Enteric feeding tube is position with tip in the proximal jejunum. Appendix appears normal. No evidence of bowel wall thickening, distention, or inflammatory changes. Descending and sigmoid diverticulosis. Rectal tube. Vascular/Lymphatic: Aortic atherosclerosis. Right femoral venous catheter, tip position in the external iliac vein (series 3, image 107). No enlarged abdominal or pelvic lymph nodes. Reproductive: No mass or other abnormality. Other: No abdominal wall hernia or abnormality. Anasarca. Trace ascites. Musculoskeletal: No acute or significant osseous  findings. IMPRESSION: 1. No evidence of retroperitoneal hematoma or other source of hemorrhage in the chest, abdomen, or pelvis. 2. Extensive pneumomediastinum and subpectoral emphysema about the chest wall,  likely due to barotrauma. 3. Examination of the lungs is significantly limited by breath motion artifact. There is very extensive, dense heterogeneous airspace opacity and consolidation throughout the lungs with small bilateral pleural effusions. Findings are most consistent with ARDS. 4. Support apparatus including tracheostomy and venovenous ECMO as detailed above. 5. Trace ascites and anasarca. 6. Coronary artery disease. Aortic Atherosclerosis (ICD10-I70.0). Electronically Signed   By: Lauralyn Primes M.D.   On: 09/24/2020 10:53   CT ABDOMEN PELVIS W CONTRAST  Result Date: 09/24/2020 CLINICAL DATA:  ECMO patient, acute drop in hemoglobin, hypotension, retroperitoneal hematoma suspected EXAM: CT CHEST, ABDOMEN, AND PELVIS WITH CONTRAST TECHNIQUE: Multidetector CT imaging of the chest, abdomen and pelvis was performed following the standard protocol during bolus administration of intravenous contrast. CONTRAST:  OMNIPAQUE IOHEXOL 300 MG/ML  SOLN COMPARISON:  None. FINDINGS: CT CHEST FINDINGS Cardiovascular: Right internal jugular approach veno venous ECMO cannula, tip positioned in the superior abdominal inferior vena cava. Left upper extremity PICC, tip positioned within the mid SVC. Normal heart size. Left coronary artery calcifications. No pericardial effusion. Mediastinum/Nodes: No enlarged mediastinal, hilar, or axillary lymph nodes. Extensive pneumomediastinum. Tracheostomy. Thyroid and esophagus demonstrate no significant findings. Lungs/Pleura: Examination of the lungs is significantly limited by breath motion artifact. There is very extensive, dense heterogeneous airspace opacity and consolidation throughout the lungs with small bilateral pleural effusions. No significant pneumothorax. Musculoskeletal: No chest wall mass or suspicious bone lesions identified. There is subpectoral emphysema about the chest wall (series 3, image 17). CT ABDOMEN PELVIS FINDINGS Hepatobiliary: No solid liver  abnormality is seen. No gallstones, gallbladder wall thickening, or biliary dilatation. Pancreas: Unremarkable. No pancreatic ductal dilatation or surrounding inflammatory changes. Spleen: Normal in size without significant abnormality. Adrenals/Urinary Tract: Adrenal glands are unremarkable. Kidneys are normal, without renal calculi, solid lesion, or hydronephrosis. Foley catheter within the urinary bladder. Stomach/Bowel: Stomach is within normal limits. Enteric feeding tube is position with tip in the proximal jejunum. Appendix appears normal. No evidence of bowel wall thickening, distention, or inflammatory changes. Descending and sigmoid diverticulosis. Rectal tube. Vascular/Lymphatic: Aortic atherosclerosis. Right femoral venous catheter, tip position in the external iliac vein (series 3, image 107). No enlarged abdominal or pelvic lymph nodes. Reproductive: No mass or other abnormality. Other: No abdominal wall hernia or abnormality. Anasarca. Trace ascites. Musculoskeletal: No acute or significant osseous findings. IMPRESSION: 1. No evidence of retroperitoneal hematoma or other source of hemorrhage in the chest, abdomen, or pelvis. 2. Extensive pneumomediastinum and subpectoral emphysema about the chest wall, likely due to barotrauma. 3. Examination of the lungs is significantly limited by breath motion artifact. There is very extensive, dense heterogeneous airspace opacity and consolidation throughout the lungs with small bilateral pleural effusions. Findings are most consistent with ARDS. 4. Support apparatus including tracheostomy and venovenous ECMO as detailed above. 5. Trace ascites and anasarca. 6. Coronary artery disease. Aortic Atherosclerosis (ICD10-I70.0). Electronically Signed   By: Lauralyn Primes M.D.   On: 09/24/2020 10:53   DG CHEST PORT 1 VIEW  Result Date: 10/09/2020 CLINICAL DATA:  ECMO EXAM: PORTABLE CHEST 1 VIEW COMPARISON:  Yesterday FINDINGS: Tracheostomy and feeding tubes in  unremarkable in stable position. Unchanged ECMO catheter and left PICC position. Confluent bilateral pneumonia without interval change. Prominent heart size accentuated by fat based on CT. No visible effusion or air leak. IMPRESSION:  Stable hardware positioning and confluent pneumonia. Electronically Signed   By: Marnee Spring M.D.   On: 10/09/2020 06:19   DG CHEST PORT 1 VIEW  Result Date: 10/08/2020 CLINICAL DATA:  Echo EXAM: PORTABLE CHEST 1 VIEW COMPARISON:  October 07, 2020 FINDINGS: The cardiomediastinal silhouette is unchanged and obscured.ECMO cannulation support apparatus. The enteric tube courses through the chest to the abdomen beyond the field-of-view. Tracheostomy. LEFT upper extremity PICC tip terminates over the superior cavoatrial junction. No pleural effusion. No pneumothorax. Diffuse heterogeneous opacities with a peripheral consolidative predominance, unchanged. Visualized abdomen is unremarkable. No acute osseous abnormality. IMPRESSION: 1.  Support apparatus as described above. 2. Diffuse heterogeneous opacities with a peripheral consolidative predominance, unchanged. Electronically Signed   By: Meda Klinefelter MD   On: 10/08/2020 08:52   DG CHEST PORT 1 VIEW  Result Date: 10/07/2020 CLINICAL DATA:  Personal history of ECMO EXAM: PORTABLE CHEST 1 VIEW COMPARISON:  Yesterday FINDINGS: ECMO catheter in similar position. Unremarkable positioning of left PICC, tracheostomy tube, and feeding tube. Unchanged confluent pneumonia. No visible effusion or pneumothorax. Cardiomegaly. IMPRESSION: Stable hardware positioning and bilateral pneumonia. Electronically Signed   By: Marnee Spring M.D.   On: 10/07/2020 07:37   DG CHEST PORT 1 VIEW  Result Date: 10/06/2020 CLINICAL DATA:  ECMO.  COVID. EXAM: PORTABLE CHEST 1 VIEW COMPARISON:  Yesterday FINDINGS: Confluent airspace disease. ECMO catheter in stable position. Tracheostomy tube in place. The enteric tube at least reaches the stomach. Left  subclavian line with tip at the SVC. No air leak. Normal heart size. IMPRESSION: Stable hardware positioning and confluent pneumonia. Electronically Signed   By: Marnee Spring M.D.   On: 10/06/2020 06:50   DG CHEST PORT 1 VIEW  Result Date: 10/05/2020 CLINICAL DATA:  Tracheostomy.  ECMO.  Recent COVID. EXAM: PORTABLE CHEST 1 VIEW COMPARISON:  CT 10/04/2020.  Chest x-ray 10/04/2020. FINDINGS: Interim removal of NG tube and placement of feeding tube. Tracheostomy tube, left PICC line, ECMO catheter stable position. Stable cardiomegaly. Diffuse severe bilateral pulmonary infiltrates again noted without interim change. No pleural effusion or pneumothorax. IMPRESSION: 1. Interim removal of NG tube and placement of feeding tube. 2. Remaining lines and tubes stable. 3. Diffuse severe bilateral pulmonary infiltrates again noted without interim change. Electronically Signed   By: Maisie Fus  Register   On: 10/05/2020 06:29   DG CHEST PORT 1 VIEW  Result Date: 10/04/2020 CLINICAL DATA:  Respiratory failure, COVID pneumonia, ECMO EXAM: PORTABLE CHEST 1 VIEW COMPARISON:  10/03/2020 FINDINGS: Tracheostomy, nasogastric tube extending into the upper abdomen beyond the margin of the examination, and left upper extremity PICC line with its tip within the superior vena cava are unchanged. ECMO cannula has been slightly withdrawn with its proximal marker overlying the superior vena cava, its middle marker over the expected superior cavoatrial junction, and its distal marker likely within the a inferior right atrium.l Pulmonary insufflation is preserved. Lung volumes are small. Extensive diffuse airspace infiltrate appears stable with numerous air bronchograms noted within the lung bases bilaterally. No pneumothorax or pleural effusion. IMPRESSION: ECMO catheter slightly withdrawn. Advancement of approximately 3-4 cm may better position the middle cannula within the right atrium. Otherwise stable support lines and tubes. Stable  pulmonary insufflation.  Lung volumes are small. Stable diffuse extensive airspace infiltrate, likely infectious. Electronically Signed   By: Helyn Numbers MD   On: 10/04/2020 06:43   DG CHEST PORT 1 VIEW  Result Date: 10/03/2020 CLINICAL DATA:  Tracheostomy.  ECMO.  Recent COVID. EXAM:  PORTABLE CHEST 1 VIEW COMPARISON:  10/02/2020. FINDINGS: Tracheostomy tube, NG tube, left PICC line, ECMO device in stable position. Heart size stable. Low lung volumes. Diffuse severe bilateral pulmonary infiltrates again noted without interim change. No pleural effusion or pneumothorax. IMPRESSION: 1. Lines and tubes in stable position. 2. Low lung volumes. Diffuse severe bilateral pulmonary infiltrates again noted without interim change. Electronically Signed   By: Maisie Fus  Register   On: 10/03/2020 06:05   DG CHEST PORT 1 VIEW  Result Date: 10/02/2020 CLINICAL DATA:  Respiratory failure. EXAM: PORTABLE CHEST 1 VIEW COMPARISON:  10/01/2020 FINDINGS: 0530 hours. Tracheostomy tube again noted. The NG tube passes into the stomach although the distal tip position is not included on the film. ECMO cannula remains in place. Left PICC line tip overlies the proximal to mid SVC level. Diffuse bilateral airspace disease is similar. IMPRESSION: 1. No substantial interval change in cardiopulmonary exam. 2. Stable support apparatus. Electronically Signed   By: Kennith Center M.D.   On: 10/02/2020 06:17   DG CHEST PORT 1 VIEW  Result Date: 10/01/2020 CLINICAL DATA:  60 year old male COVID-19.  On ECMO. EXAM: PORTABLE CHEST 1 VIEW COMPARISON:  Portable chest 09/30/2020 and earlier. FINDINGS: Portable AP supine view at 0527 hours. Stable tracheostomy, ECMO cannula. Stable left PICC line. Enteric tube has been placed and courses to the stomach with tip not included. Interval resolved gas distended stomach in the visible abdomen. Diffuse bilateral pulmonary opacity, more confluent in both lungs since yesterday with increased obscuration of  the mediastinum. No pneumothorax identified. No pleural effusion is evident. Paucity of bowel gas now in the upper abdomen. Stable visualized osseous structures. IMPRESSION: 1. Worsening, now severe bilateral pulmonary opacity. Obscured mediastinal contours. No pneumothorax. 2. Enteric tube placed and stomach decompressed. 3. Otherwise stable lines and tubes. Electronically Signed   By: Odessa Fleming M.D.   On: 10/01/2020 07:37   DG CHEST PORT 1 VIEW  Result Date: 09/30/2020 CLINICAL DATA:  ECMO, vomiting EXAM: PORTABLE CHEST 1 VIEW COMPARISON:  Chest radiograph from one day prior. FINDINGS: Tracheostomy tube tip overlies the tracheal air column at the thoracic inlet. Left PICC terminates over the middle third of the SVC. Interval removal of enteric tube. Stable ECMO catheter extending from the lower right neck into the medial upper right abdomen. Stable cardiomediastinal silhouette with normal heart size. No pneumothorax. Stable mild bilateral pleural thickening. Extensive patchy opacities throughout both lungs with low lung volumes and probable diffuse mild bronchiectasis, not substantially changed. IMPRESSION: 1. Support structures as detailed. No pneumothorax. 2. Stable extensive patchy opacities throughout both lungs with low lung volumes and probable diffuse mild bronchiectasis. Electronically Signed   By: Delbert Phenix M.D.   On: 09/30/2020 08:48   DG CHEST PORT 1 VIEW  Result Date: 09/29/2020 CLINICAL DATA:  ECMO.  Recent COVID EXAM: PORTABLE CHEST 1 VIEW COMPARISON:  09/28/2020 FINDINGS: Support devices remain in place, unchanged. Extensive bilateral airspace disease, unchanged. No visible effusions or pneumothorax. IMPRESSION: No change severe bilateral airspace disease. Electronically Signed   By: Charlett Nose M.D.   On: 09/29/2020 07:33   DG CHEST PORT 1 VIEW  Result Date: 09/28/2020 CLINICAL DATA:  60 year old male COVID-19. EXAM: PORTABLE CHEST 1 VIEW COMPARISON:  Portable chest 09/27/2020 and  earlier. FINDINGS: Portable AP semi upright view at 0527 hours. Stable ECMO cannula. Stable tracheostomy tube, left PICC line, visible enteric feeding tube. Mildly lower lung volumes. Mediastinal contours remain within normal limits. No pneumothorax. Patchy and confluent bilateral pulmonary  opacity. Improved perihilar ventilation since 09/26/2020. No definite pleural effusion. Paucity of bowel gas in the upper abdomen. IMPRESSION: 1.  Stable lines and tubes. 2. Severe bilateral pneumonia with mildly improved ventilation in both lungs since 09/26/2020. Electronically Signed   By: Odessa Fleming M.D.   On: 09/28/2020 07:21   DG CHEST PORT 1 VIEW  Result Date: 09/27/2020 CLINICAL DATA:  Recent COVID-19 positive. Respiratory failure. ECMO therapy EXAM: PORTABLE CHEST 1 VIEW COMPARISON:  September 26, 2020 FINDINGS: Tracheostomy catheter tip is 6.3 cm above the carina. ECMO catheter tip is in the inferior vena cava. Peripherally inserted left-sided central catheter tip in superior vena cava. Enteric tube tip is below the diaphragm. No pneumothorax. Widespread airspace opacity persists without appreciable change. Heart size and pulmonary vascularity are normal. No adenopathy. No bone lesions. IMPRESSION: Tube and catheter positions as described without pneumothorax. Widespread airspace opacity persists without appreciable change. Stable cardiac silhouette. Electronically Signed   By: Bretta Bang III M.D.   On: 09/27/2020 08:00   DG CHEST PORT 1 VIEW  Result Date: 09/26/2020 CLINICAL DATA:  ECMO. EXAM: PORTABLE CHEST 1 VIEW COMPARISON:  09/25/2020.  09/24/2020.  CT 09/24/2020. FINDINGS: , Feeding tube, left PICC line, ECMO device in stable position. Heart size stable. Dense bilateral pulmonary infiltrates are again noted. Very slight improvement in aeration noted on today's exam. No pleural effusion or pneumothorax. IMPRESSION: 1. Lines and tubes in stable position. 2. Dense bilateral pulmonary infiltrates again  noted. Very slight improvement in aeration noted on today's exam. Electronically Signed   By: Maisie Fus  Register   On: 09/26/2020 06:07   DG CHEST PORT 1 VIEW  Result Date: 09/25/2020 CLINICAL DATA:  Respiratory failure. COVID pneumonia. ECMO. EXAM: PORTABLE CHEST 1 VIEW COMPARISON:  09/24/2020 FINDINGS: The tracheostomy tube is stable. The feeding tube is stable. Left PICC line is stable. ECMO catheter is unchanged. Persistent diffuse airspace process in the lungs. No large pleural effusion or pneumothorax. IMPRESSION: 1. Stable support apparatus. 2. Persistent diffuse airspace process. Electronically Signed   By: Rudie Meyer M.D.   On: 09/25/2020 07:43   DG CHEST PORT 1 VIEW  Result Date: 09/24/2020 CLINICAL DATA:  Check tracheostomy placement EXAM: PORTABLE CHEST 1 VIEW COMPARISON:  09/23/2020 FINDINGS: Cardiac shadow is stable. Tracheostomy tube, left-sided PICC line and feeding catheter are again noted and stable. ECMO cannula is noted extending into the IVC. Diffuse bilateral airspace disease is again noted and stable. Changes of pneumomediastinum are noted but decreased. Small right pleural effusion is noted. No bony abnormality is seen. IMPRESSION: Relatively stable appearance of the chest although some decrease in the degree of pneumomediastinum is noted. Electronically Signed   By: Alcide Clever M.D.   On: 09/24/2020 08:00   DG CHEST PORT 1 VIEW  Result Date: 09/23/2020 CLINICAL DATA:  ECMO. EXAM: PORTABLE CHEST 1 VIEW COMPARISON:  09/22/2020 FINDINGS: 0704 hours. Tracheostomy and feeding tubes again noted. ECMO cannula overlies the medial right chest. Right PICC line has been removed in the interval. Diffuse bilateral airspace disease ease again noted. Prominent pneumomediastinum persists. Left PICC line is new in the interval with tip overlying the SVC/RA junction. IMPRESSION: *New left PICC line tip overlies the SVC/RA junction. *Interval removal of right PICC line. *Persistent diffuse  bilateral airspace disease with pneumomediastinum. Electronically Signed   By: Kennith Center M.D.   On: 09/23/2020 08:41   DG CHEST PORT 1 VIEW  Result Date: 09/22/2020 CLINICAL DATA:  COVID positive.  ECMO. EXAM: PORTABLE CHEST  1 VIEW COMPARISON:  09/21/2020 FINDINGS: 0540 hours. Tracheostomy and feeding tubes again noted. ECMO cannula appears stable given differential positioning. Right PICC line tip is obscured by the ECMO cannula. Diffuse bilateral airspace disease is similar. Persistent pneumomediastinum. Telemetry leads overlie the chest. IMPRESSION: No substantial interval change in exam. Diffuse bilateral airspace disease with pneumomediastinum. Electronically Signed   By: Kennith Center M.D.   On: 09/22/2020 06:39   DG CHEST PORT 1 VIEW  Result Date: 09/21/2020 CLINICAL DATA:  Hypoxia EXAM: PORTABLE CHEST 1 VIEW COMPARISON:  September 20, 2020 FINDINGS: Tracheostomy catheter tip is 6.1 cm above the carina. ECMO catheter tip is in the inferior vena cava. Enteric tube tip is below the diaphragm. No pneumothorax. Pneumomediastinum, however, is present, similar in appearance to 1 day prior. Subcutaneous air noted on the left. Airspace opacity throughout the lungs bilaterally is stable with small pleural effusions bilaterally. Heart is mildly enlarged with pulmonary vascularity normal. No adenopathy. No bone lesions. IMPRESSION: Tube and catheter positions as described without pneumothorax. Pneumomediastinum again noted, similar to 1 day prior. Subcutaneous air now present on the left. Widespread airspace opacity bilaterally without appreciable change. Stable cardiac prominence. Electronically Signed   By: Bretta Bang III M.D.   On: 09/21/2020 07:54   DG CHEST PORT 1 VIEW  Result Date: 09/20/2020 CLINICAL DATA:  Intubation.  COVID-19. EXAM: PORTABLE CHEST 1 VIEW COMPARISON:  Yesterday FINDINGS: New lucency around the mediastinum on both sides. No soft tissue emphysema or pneumothorax.  Confluent airspace disease on both sides. Stable positioning of PICC, ECMO catheter, feeding tube, and tracheostomy tube. Stable borderline heart size. IMPRESSION: 1. New pneumomediastinum.  No pneumothorax. 2. Stable hardware positioning and confluent pulmonary opacification. Electronically Signed   By: Marnee Spring M.D.   On: 09/20/2020 08:14   DG CHEST PORT 1 VIEW  Result Date: 09/19/2020 CLINICAL DATA:  ECMO.  COVID. EXAM: PORTABLE CHEST 1 VIEW COMPARISON:  09/18/2020. FINDINGS: Interim removal of left IJ line. Tracheostomy tube, feeding tube, right PICC line, and ECMO device in stable position. Heart size stable. Diffuse severe bilateral pulmonary infiltrates are again noted without interim change. Bilateral layering pleural effusions again cannot be excluded. No pneumothorax. IMPRESSION: 1. Interim removal of left IJ line. Remaining lines and tubes including ECMO device in stable position. 2. Diffuse severe bilateral pulmonary infiltrates are again noted without interim change. Bilateral layering pleural effusions again cannot be excluded. Electronically Signed   By: Maisie Fus  Register   On: 09/19/2020 06:44   DG CHEST PORT 1 VIEW  Result Date: 09/18/2020 CLINICAL DATA:  Tracheostomy.  ECMO.  Recent COVID. EXAM: PORTABLE CHEST 1 VIEW COMPARISON:  09/17/2020. FINDINGS: Tracheostomy tube, left IJ line, feeding tube, right PICC line, ECMO device in stable position. Heart size normal. Diffuse dense bilateral pulmonary infiltrates are again noted. Very slight improvement in aeration may be present. Bilateral layering pleural effusions again cannot be excluded. No pneumothorax. IMPRESSION: 1. Lines and tubes in stable position. 2. Diffuse dense bilateral pulmonary infiltrates are again noted. Very slight improvement in aeration may be present. Bilateral layering pleural effusions again cannot be excluded. Electronically Signed   By: Maisie Fus  Register   On: 09/18/2020 06:39   DG CHEST PORT 1  VIEW  Result Date: 09/17/2020 CLINICAL DATA:  Acute respiratory failure. Endotracheally intubated. EXAM: PORTABLE CHEST 1 VIEW COMPARISON:  09/16/2020 FINDINGS: Support lines and tubes in appropriate position. No pneumothorax visualized. Severe bilateral diffuse pulmonary airspace disease shows no significant change. Probable layering bilateral pleural effusions  for noted in the upper lung zones. : No significant change in severe bilateral diffuse pulmonary airspace disease and probable layering pleural effusions. Electronically Signed   By: Danae Orleans M.D.   On: 09/17/2020 11:06   DG CHEST PORT 1 VIEW  Result Date: 09/16/2020 CLINICAL DATA:  ECMO EXAM: PORTABLE CHEST 1 VIEW COMPARISON:  Chest radiograph from one day prior. FINDINGS: Tracheostomy tube tip overlies the tracheal air column at the thoracic inlet. Right PICC terminates over the cavoatrial junction. Left internal jugular central venous catheter terminates in upper third of the SVC. Enteric tube enters stomach with the tip not seen on this image. Stable right neck ECMO catheter coursing into the IVC. No pneumothorax. Complete opacification of the bilateral hemithoraces, worsened, with stable central air bronchograms bilaterally. IMPRESSION: 1. Support structures as detailed. No pneumothorax. 2. Complete opacification of the bilateral hemithoraces, worsened, with stable central air bronchograms bilaterally, compatible with ARDS/pneumonia/atelectasis. Electronically Signed   By: Delbert Phenix M.D.   On: 09/16/2020 11:49   DG CHEST PORT 1 VIEW  Result Date: 09/15/2020 CLINICAL DATA:  COVID positive.  ECMO. EXAM: PORTABLE CHEST 1 VIEW COMPARISON:  09/14/2020 FINDINGS: Tracheostomy tube again noted. A feeding tube passes into the stomach although the distal tip position is not included on the film. Left IJ central line tip overlies the innominate vein confluence. ECMO cannula again noted. Diffuse bilateral airspace disease is similar to prior.  Telemetry leads overlie the chest. IMPRESSION: 1. No substantial interval change in exam. 2. Diffuse bilateral airspace disease. Electronically Signed   By: Kennith Center M.D.   On: 09/15/2020 08:13   DG CHEST PORT 1 VIEW  Result Date: 09/14/2020 CLINICAL DATA:  COVID-19 positivity on ECMO with acute respiratory distress EXAM: PORTABLE CHEST 1 VIEW COMPARISON:  09/14/2020 FINDINGS: Cardiac shadow is stable. Tracheostomy tube is now seen in satisfactory position. Feeding catheter and left jugular central line are again noted. ECMO cannula is seen in satisfactory position. Diffuse airspace opacity is noted in both lungs with air bronchograms similar to that seen on the prior exam. Bilateral pleural effusions are noted as well. No bony abnormality is seen. IMPRESSION: New tracheostomy tube in place. The remainder of the exam is stable from the prior study. Electronically Signed   By: Alcide Clever M.D.   On: 09/14/2020 12:35   DG CHEST PORT 1 VIEW  Result Date: 09/14/2020 CLINICAL DATA:  COVID-19 positive. EXAM: PORTABLE CHEST 1 VIEW COMPARISON:  September 13, 2020. FINDINGS: Endotracheal tube and feeding tube are in good position. ECMO device is unchanged. Left internal jugular catheter is unchanged. Stable diffuse bilateral lung opacities are noted consistent with pneumonia. No pneumothorax is noted. Bony thorax is unremarkable. IMPRESSION: Stable support apparatus. Stable diffuse bilateral lung opacities are noted consistent with pneumonia. Electronically Signed   By: Lupita Raider M.D.   On: 09/14/2020 08:04   DG CHEST PORT 1 VIEW  Result Date: 09/13/2020 CLINICAL DATA:  COVID-19 positive. EXAM: PORTABLE CHEST 1 VIEW COMPARISON:  September 12, 2020. FINDINGS: Stable position of endotracheal and feeding tubes. Left internal jugular catheter is unchanged. ECMO is unchanged. Stable bilateral diffuse lung opacities are noted consistent with pneumonia. No pneumothorax is noted. Bony thorax is unremarkable.  IMPRESSION: Stable support apparatus. Stable bilateral diffuse lung opacities are noted consistent with pneumonia. Electronically Signed   By: Lupita Raider M.D.   On: 09/13/2020 08:11   DG CHEST PORT 1 VIEW  Result Date: 09/12/2020 CLINICAL DATA:  COVID.  ECMO.  EXAM: PORTABLE CHEST 1 VIEW COMPARISON:  09/11/2020. FINDINGS: Endotracheal tube, feeding tube, left IJ line, ECMO device in stable position. Progressive dense bilateral pulmonary infiltrates with near complete opacification of both lungs. Bilateral pleural effusions cannot be excluded. No pneumothorax. Heart size cannot be assessed. No acute bony abnormality. IMPRESSION: 1. Lines and tube including ECMO device s in stable position. 2. Progressive dense bilateral pulmonary infiltrates with near complete opacification of both lungs. Electronically Signed   By: Maisie Fus  Register   On: 09/12/2020 07:01   DG CHEST PORT 1 VIEW  Result Date: 09/11/2020 CLINICAL DATA:  Hypoxia.  ECMO treatment EXAM: PORTABLE CHEST 1 VIEW COMPARISON:  September 10, 2020 FINDINGS: Endotracheal tube tip is 5.8 cm above the carina. Central catheter tip is in the superior vena cava. Feeding tube tip is below the diaphragm. ECMO catheter tip is in the inferior vena cava. No pneumothorax. Widespread airspace opacity persists bilaterally with underlying pleural effusions. There is stable cardiomegaly with pulmonary vascularity within normal limits. No adenopathy. No bone lesions. IMPRESSION: Tube and catheter positions as described without pneumothorax. Small pleural effusions bilaterally with widespread airspace opacity bilaterally, stable. Stable cardiac prominence. Electronically Signed   By: Bretta Bang III M.D.   On: 09/11/2020 08:03   DG CHEST PORT 1 VIEW  Result Date: 09/10/2020 CLINICAL DATA:  Hypoxia due to COVID 19 pneumonia EXAM: PORTABLE CHEST 1 VIEW COMPARISON:  09/10/2020; 09/09/2020; 09/07/2020 FINDINGS: Grossly unchanged cardiac silhouette and  mediastinal contours. Stable position of support apparatus. No pneumothorax. Suspected trace bilateral effusions. Slightly improved aeration of the bilateral lung apices with persistent extensive bilateral consolidative opacities, now with air bronchograms about the hila bilaterally. No acute osseous abnormalities. IMPRESSION: 1. Stable positioning of support apparatus. No pneumothorax. 2. Slightly improved aeration of the lungs with persistent extensive consolidative opacities, now with air bronchograms about the bilateral hila Electronically Signed   By: Simonne Come M.D.   On: 09/10/2020 08:30   DG CHEST PORT 1 VIEW  Result Date: 09/10/2020 CLINICAL DATA:  Shortness of breath EXAM: PORTABLE CHEST 1 VIEW COMPARISON:  X-ray 1 day prior FINDINGS: The endotracheal tube terminates above the carina. The enteric tube extends below the left hemidiaphragm. The left-sided central venous catheter is stable in positioning. ECMO catheter is essentially stable. There are diffuse hazy bilateral airspace opacities, similar to prior study. There is no pneumothorax. IMPRESSION: No significant interval change. Electronically Signed   By: Katherine Mantle M.D.   On: 09/10/2020 01:18   DG Abd Portable 1V  Result Date: 10/04/2020 CLINICAL DATA:  Nasogastric tube placement EXAM: PORTABLE ABDOMEN - 1 VIEW COMPARISON:  09/30/2020 FINDINGS: Nasogastric tube with the tip projecting over the expected location of the proximal jejunum. No bowel dilatation to suggest obstruction. No evidence of pneumoperitoneum, portal venous gas or pneumatosis. No pathologic calcifications along the expected course of the ureters. No acute osseous abnormality. IMPRESSION: Nasogastric tube with the tip projecting over the expected location of the proximal jejunum. Electronically Signed   By: Elige Ko   On: 10/04/2020 11:14   DG Abd Portable 1V  Result Date: 09/30/2020 CLINICAL DATA:  Gastric tube placement EXAM: PORTABLE ABDOMEN - 1 VIEW  COMPARISON:  Chest x-ray obtained earlier today FINDINGS: The tip of the gastric tube is in good position overlying the gastric antrum. Incompletely imaged ECMO tube. No significant gaseous dilation. IMPRESSION: Well-positioned gastric tube. Electronically Signed   By: Malachy Moan M.D.   On: 09/30/2020 10:07   DG Abd Portable 1V  Result Date: 09/27/2020 CLINICAL DATA:  Ileus EXAM: PORTABLE ABDOMEN - 1 VIEW COMPARISON:  September 06, 2020 FINDINGS: Feeding tube in the small bowel at the level of ligament of Treitz. ECMO cannula in place projects over the T12 level entering via superior approach, off the field of the radiograph. Stable position compared to prior CT evaluation. Diffuse pulmonary opacities with similar appearance, partially imaged. Scattered gas-filled loops of small bowel without signs of significant dilation. Gas and stool throughout the colon. Small amount of gas in the mid rectum. No acute skeletal process to the extent evaluated. IMPRESSION: 1. Feeding tube in the small bowel at the level of the ligament of Treitz. 2. Scattered gas-filled loops of small bowel without signs of significant dilation, may reflect mild ileus. 3. ECMO cannula passing below the RIGHT hemidiaphragm tip at the upper margin of T12 as on prior CT. 4. Diffuse interstitial and airspace opacities at the lung bases as on the prior study. Electronically Signed   By: Donzetta Kohut M.D.   On: 09/27/2020 11:22   VAS [place] UPPER EXTREMITY VENOUS DUPLEX  Result Date: 09/21/2020 UPPER VENOUS STUDY  Indications: Swelling, and Line in right arm Limitations: Line, bandages and trach collar. Comparison Study: No prior study Performing Technologist: Sherren Kerns RVS  Examination Guidelines: A complete evaluation includes B-mode imaging, spectral Doppler, color Doppler, and power Doppler as needed of all accessible portions of each vessel. Bilateral testing is considered an integral part of a complete examination. Limited  examinations for reoccurring indications may be performed as noted.  Right Findings: +----------+------------+---------+-----------+----------+---------------------+ RIGHT     CompressiblePhasicitySpontaneousProperties       Summary        +----------+------------+---------+-----------+----------+---------------------+ IJV                                                    Not visualized     +----------+------------+---------+-----------+----------+---------------------+ Subclavian    Full       Yes       Yes                                    +----------+------------+---------+-----------+----------+---------------------+ Axillary      Full       Yes       Yes                                    +----------+------------+---------+-----------+----------+---------------------+ Brachial      Full       Yes       Yes              not all segment well                                                           visualized       +----------+------------+---------+-----------+----------+---------------------+ Radial        Full                                                        +----------+------------+---------+-----------+----------+---------------------+  Ulnar                                                patent by color and                                                             Doppler        +----------+------------+---------+-----------+----------+---------------------+ Cephalic      Full                                                        +----------+------------+---------+-----------+----------+---------------------+ Basilic       Full                                                        +----------+------------+---------+-----------+----------+---------------------+  Left Findings: +----------+------------+---------+-----------+----------+-------+ LEFT      CompressiblePhasicitySpontaneousPropertiesSummary  +----------+------------+---------+-----------+----------+-------+ Subclavian    Full       Yes       Yes                      +----------+------------+---------+-----------+----------+-------+  Summary:  Right: No evidence of deep vein thrombosis in the upper extremity. No evidence of superficial vein thrombosis in the upper extremity.  Left: No evidence of thrombosis in the subclavian.  *See table(s) above for measurements and observations.  Diagnosing physician: Sherald Hess MD Electronically signed by Sherald Hess MD on 09/21/2020 at 7:56:36 PM.    Final    [place] EKG SITE RITE  Result Date: 09/22/2020 If Site Rite image not attached, placement could not be confirmed due to current cardiac rhythm.  [place] EKG SITE RITE  Result Date: 09/15/2020 If Site Rite image not attached, placement could not be confirmed due to current cardiac rhythm.   Labs:  CBC: Recent Labs    10/07/20 1632 10/07/20 1638 10/08/20 0438 10/08/20 0519 10/08/20 1624 10/08/20 2012 10/08/20 2258 10/09/20 0014 10/09/20 0145 10/09/20 0414  WBC 7.1  --  7.3  --  7.7  --   --   --   --  9.0  HGB 8.0*   < > 7.5*   < > 7.3*   < > 8.5* 8.2* 7.5* 8.3*  HCT 26.7*   < > 24.7*   < > 23.3*   < > 25.0* 24.0* 22.0* 27.1*  PLT 185  --  194  --  184  --   --   --   --  187   < > = values in this interval not displayed.    COAGS: Recent Labs    09/25/20 0400 09/25/20 1555 09/26/20 0403 09/26/20 1548 09/27/20 0339 09/27/20 1701 10/06/20 0317 10/06/20 1613 10/07/20 1632 10/08/20 0438 10/08/20 1624 10/09/20 0414  INR 1.6*  --  1.4*  --  1.3*  --  1.5*  --   --   --   --   --  APTT 50*   < > 53*   < > 47*   < > 53*   < > 59* 56* 46* 55*   < > = values in this interval not displayed.    BMP: Recent Labs    10/07/20 1632 10/07/20 1638 10/08/20 0438 10/08/20 0519 10/08/20 1624 10/08/20 2012 10/08/20 2258 10/09/20 0014 10/09/20 0145 10/09/20 0414  NA 153*   < > 151*   < > 154*   < > 155* 154*  154* 153*  K 4.3   < > 3.6   < > 3.7   < > 4.1 4.2 4.1 4.0  CL 115*  --  114*  --  114*  --   --   --   --  116*  CO2 30  --  28  --  31  --   --   --   --  30  GLUCOSE 146*  --  162*  --  127*  --   --   --   --  161*  BUN 79*  --  83*  --  87*  --   --   --   --  89*  CALCIUM 9.0  --  8.8*  --  8.9  --   --   --   --  8.9  CREATININE 1.10  --  1.09  --  1.16  --   --   --   --  1.16  GFRNONAA >60  --  >60  --  >60  --   --   --   --  >60   < > = values in this interval not displayed.    LIVER FUNCTION TESTS: Recent Labs    09/25/20 0400 09/25/20 1600 09/26/20 0403 09/26/20 1548 09/27/20 0339 09/27/20 1701 10/02/20 0401 10/02/20 1600 10/07/20 1632 10/08/20 0438 10/08/20 1624 10/09/20 0414  BILITOT 1.7*  --  1.7*  --  1.4*  --  1.1  --   --   --   --   --   AST 44*  --  49*  --  49*  --  48*  --   --   --   --   --   ALT 59*  --  39  --  50*  --  46*  --   --   --   --   --   ALKPHOS 113  --  112  --  138*  --  141*  --   --   --   --   --   PROT 4.5*  --  5.1*  --  5.4*  --  5.9*  --   --   --   --   --   ALBUMIN 2.5*   < > 2.7*  2.6*   < > 2.8*  2.8*   < > 3.0*  3.0*   < > 2.4* 2.3* 2.4* 2.4*   < > = values in this interval not displayed.    TUMOR MARKERS: No results for input(s): AFPTM, CEA, CA199, CHROMGRNA in the last 8760 hours.  Assessment and Plan:  Covid +; hypoxemia; Resp failure Admitted 12/4 Trach/vent VV ECMO Deconditioning Dysphagia cortrak in over 1 mo Scheduled for percutaneous gastric tube placement in IR 1/11 10 am  Risks and benefits image guided gastrostomy tube placement was discussed with the patient's wife [name] via phone including, but not limited to the need for a barium enema during the procedure, bleeding, infection,  peritonitis and/or damage to adjacent structures.  All  questions were answered, she is agreeable to proceed.  Consent signed and in chart.  Thank you for this interesting consult.  I greatly enjoyed meeting [facility] and look forward to participating in their care.  A copy of this report was sent to the requesting provider on this date.  Electronically Signed: Robet Leu, PA-C 10/09/2020, 9:54 AM   I spent a total of 20 Minutes    in face to face in clinical consultation, greater than 50% of which was counseling/coordinating care for percutaneous gastric tube placement

## 2020-10-09 NOTE — Plan of Care (Signed)
  Problem: Respiratory: Goal: Will maintain a patent airway Outcome: Progressing Goal: Complications related to the disease process, condition or treatment will be avoided or minimized Outcome: Progressing   Problem: Fluid Volume: Goal: Hemodynamic stability will improve Outcome: Progressing   Problem: Clinical Measurements: Goal: Diagnostic test results will improve Outcome: Progressing Goal: Signs and symptoms of infection will decrease Outcome: Progressing   Problem: Clinical Measurements: Goal: Ability to maintain clinical measurements within normal limits will improve Outcome: Progressing Goal: Will remain free from infection Outcome: Progressing Goal: Diagnostic test results will improve Outcome: Progressing Goal: Respiratory complications will improve Outcome: Progressing Goal: Cardiovascular complication will be avoided Outcome: Progressing

## 2020-10-09 NOTE — Progress Notes (Signed)
ANTICOAGULATION CONSULT NOTE  Pharmacy Consult for bivalirudin Indication: ECMO + bilateral DVTs + [facility] 12/26   Recent Labs    10/08/20 1624 10/08/20 2012 10/09/20 0414 10/09/20 1135 10/09/20 1542  HGB 7.3*   < > 8.3* 8.2* 8.6*  HCT 23.3*   < > 27.1* 24.0* 26.4*  PLT 184  --  187  --  196  APTT 46*  --  55*  --  56*  CREATININE 1.16  --  1.16  --  1.12   < > = values in this interval not displayed.    Estimated Creatinine Clearance: 81.2 mL/min (by C-G formula based on SCr of 1.12 mg/dL).   Assessment: 71 yom unvaccinated presenting with severe COVID ARDs, intubated on 12/6, continued to have ventilation issues despite NMB and proning - started on VV ECMO on 12/8. Pt noted to have bilateral DVTs 12/8. Bivalirudin started for ECMO anticoagulation. Head CT 12/26 with new small SAH, repeat 12/27 stable without expansion. Repeat heat CT 1/5 SAH improved.   aPTT this evening remains therapeutic at 56, CBC stable. No bleeding noted per discussion with RN.   Goal of Therapy:  aPTT 50-70 seconds  Monitor platelets by anticoagulation protocol: Yes   Plan:  Continue bivalirudin 0.06 mg/kg/hr Recheck aPTTs every 5a & 5p with ECMO labs.  Thank you for allowing pharmacy to be a part of this patient's care.  Georgina Pillion, PharmD, BCPS Clinical Pharmacist Clinical phone for 10/09/2020: [phone] 10/09/2020 4:54 PM   **Pharmacist phone directory can now be found on [email or web address] ([email or web address]).  Listed under [facility].

## 2020-10-09 NOTE — Progress Notes (Signed)
NAME:  Donald Rowe, MRN:  [id number], DOB:  April 30, 1961, LOS: 67 ADMISSION DATE:  09/08/2020, CONSULTATION DATE:  12/6 REFERRING MD:  Dr. Aileen Fass, CHIEF COMPLAINT:  SOB    Brief History   60 y/o M admitted 12/4 with 1 week hx of SOB, known COVID positive.  He is unvaccinated.  CTA chest negative for PE but demonstrated diffuse bilateral infiltrates. [place] started on 12/8   Past Medical History  Hypothyroidism  [facility] Events/Procedures  12/04 Admit  12/06 PCCM consulted  12/07 Intubated, central line, a line 12/08 VV ECMO Cannulaton, cortrak 12/13 circuit change for hemolytic anemia 12/16 Tracheostomy  12/17 bronch w/ BAL  Consults:  PCCM, Heart failure, TCTS, ECMO  Significant Diagnostic Tests:   CTA Chest 12/4 >> extensive bilateral airspace disease, no large PE identified, limited study   LE Venous Duplex 12/4 >> negative for DVT bilaterally   LE venous duplex 12/8> BLE DVTs  RUE venous duplex 12/23  RUE arterial duplex 12/23  CT head 12/26 : Minimal SAH on left side  CT abdomen and pelvis 12/26: Extensive bilateral airspace disease with small pleural effusion  CT head 12/27: Subarachnoid unchanged  Chest x-ray 1/1: Significant improvement in airspace disease  Micro Data:  COVID 12/4 >> negative  Influenza A/B 12/4 >> negative  MRSA PCR 12/5 >> negative  BCx2 12/4 >> NG Trach aspirate 12/12> rare MRSA BAL 12/17> MRSA, candida Aspergillus Ag BAL 12/17> 0.05 12/18 sars2: positive  Antimicrobials:  Azithromycin 12/5 >> 12/6  Ceftriaxone 12/5 >> 12/6  Cefepime 12/7>12/14 vanc 12/14>12/20 Meropenem 12/17>12/21  Interim history/subjective:  1/10: increased sweep overnight. Remains on vanc will set end dated for total of 10 days.   Objective   Blood pressure (!) 181/68, pulse (!) 114, temperature 98.2 F (36.8 C), temperature source Core (Comment), resp. rate (!) 32, height _0  (1.753 m), weight 95.9 kg, SpO2 95 %.    Vent  Mode: PCV FiO2 (%):  [50 %] 50 % Set Rate:  [10 bmp] 10 bmp PEEP:  [10 cmH20] 10 cmH20 Plateau Pressure:  [22 cmH20-30 cmH20] 22 cmH20   Intake/Output Summary (Last 24 hours) at 10/09/2020 0914 Last data filed at 10/09/2020 0815 Gross per 24 hour  Intake 8677.47 ml  Output 3450 ml  Net 5227.47 ml   Filed Weights   10/06/20 0556 10/07/20 0500 10/08/20 0645  Weight: 96 kg 99.9 kg 95.9 kg    Examination: Gen: critically ill appearing man laying in bed in NAD, appears older than stated age.  HEENT: [place]/AT, eyes anicteric, eomi but not tracking today.  Neck: trach w/o bleeding  CV: Tachycardic, regular rhythm Pulm: Bilateral rales improving, tidal volumes around 250 cc. Tachypneic, with some increased wob today.  Abd: soft, NT, ND. Extm: no cyanosis or edema, compression stockings in place Skin: warm & dry, pallor. No rashes. Neuro: RASS -2, will arouse but not following commands for me today.   CXR personally reviewed> persistent bilateral airspace disease,    Resolved Hospital Problem list     Assessment & Plan:   Acute hypoxemic and hypercarbic respiratory failure secondary to COVID PNA with severe ARDS. S/p VV ECMO cannulation 09/05/2020.  S/p tracheostomy 49/44 complicated by trach site bleeding improved Trivial left-sided subarachnoid hemorrhage Community-acquired pneumonia with MRSA- completed treatment. LLL HAP-staph aureus -Completed remdesivir, solumedrol, and baricitnib. -Con't full ECMO support, unable to consistently wean sweep -Completed treatment with 4 sessions of Plex for hemolysis -cont daily lasix for euvolemia goal -Completed 7  days vanc for MRSA pneumonia in early december.  -resumed vanc for repeated MRSA pneumonia.   Agitated delirium requiring titration of IV sedation.  Periods of severe agitation are limiting ability to wean ECMO -minimize infusions> on precedex alone for continuous sedation -Con't enteral seroquel, klonopin- decreasing TDD slowly,  will hold currently 0.77m TID schedule for a few days -delirium  remains main limitation to rehabilitation at this point, pt profoundly weak with lengthy pump run, concern about ability to rehab to become strong enough to wean from ecmo at this time. Esp without consistent improvement on sweep.  -will attempt to increase vent support to wean.   Bilateral lower ext DVTs- provoked by COVID infection -Continue bivalirudin; goal PTT 50-70 -needs 3 months AC for provoked DVT   Prediabetes- remains controlled  -a1c 6 -SSI PRN -goal BG 140-180  Hypothyroidism -con't PTA synthroid   Labile hypertension, contributed by periods of agitation.  Hypotension after meds. Not needing pressors or cardene at this point. -space out sedating meds and antihypertensives to avoid hypotension and oversedation in light of rising co2 -con't clonidine, amlodipine, and metoprolol -agitation control helps moderate BP -on precedex alone for continuous sedation  Hemolytic anemia - improved. More expected down trending of Hb with expected losses associated with ECMO Throbocytopenia Hyperbilirubinemia Rheumatologic-associated MAHA remains in differential.  hgb and plt are stable -Completed treatment with the Plex for 4 sessions -Transfuse if hgb <7.5. Recheck later.  Vomiting- resolved -con't reglan; decrease to 550mQ12h -TF at goal -ok to proceed with PEG at any point. Ir plan for tomorrow  SAH- suspect hypertensive in etiology> resolved on follow up imaging -PTT goals 50-70  Hypernatremia -cont fwf -con't to monitor  Deconditioning -Long road of rehab ahead if able to tolerate. Pt's agitation and delirium are the limiting factors to improving strength. Need for sedation with cannulation but inability to wean circuit for decannuylation 2/2 delirium and overall weakness.  -challenging case, ideally will be able to utilize lungs at this juncture to wean circuit  -Ok for PEG tube.   Best practice  (evaluated daily)  Diet: TF Pain/Anxiety/Delirium protocol (if indicated): PAD protocol, limit IV meds VAP protocol (if indicated): yes DVT prophylaxis: Bivalirudin GI prophylaxis: pantoprazole BID Glucose control: SSI Mobility: As tolerated Last date of multidisciplinary goals of care discussion: 10/07/19 with wife [name] at bedside, RN, ECMO specailist Summary of discussion con't aggressive care Follow up goals of care discussion due: 1/14 Code Status: Full Disposition: ICU  Critical care time: The patient is critically ill with multiple organ systems failure and requires high complexity decision making for assessment and support, frequent evaluation and titration of therapies, application of advanced monitoring technologies and extensive interpretation of multiple databases.  Critical care time 43 mins. This represents my time independent of the NPs time taking care of the pt. This is excluding procedures.    [facility] 10/09/2020, 9:14 AM

## 2020-10-09 NOTE — Procedures (Signed)
Extracorporeal support note     ECLS cannulation: 09/08/2020 Last circuit change: 09/11/2020  Indication: COVID ARDS   Configuration: VV, RIJ 32Fr [place]   Pump speed: 3400 Pump flow: 4.3 L Pump used: Cardiohelp   Sweep gas: 100%, 8 LPM   Circuit check: minimal clot Anticoagulant: bivalirudin   Changes in support: Con't aggressive care. Con't antibiotics for MRSA pneumonia. PTT goal 50-70 given previous SAH. Minimize IV infusions, con't with scheduled and PRN agitaiton meds. Needs PEG tube given prolonged course of critical illness expected- IR to place 1/11. Pt profoundly weak.   anticipated goals/duration of support: Bridge to recovery, day 33 of support.  Briant Sites, DO 10/09/20 12:18 PM [facility]

## 2020-10-09 NOTE — Progress Notes (Signed)
This chaplain responded to PMT consult for spiritual care.  The chaplain was updated by the Pt. RN-Olivia. Zollie Scale anticipates a visitor today. The chaplain will attempt to be pastorally present at the bedside today with the Pt. and family.  *1400 Family not present in the Pt. Room.  F/U spiritual care is available as needed.

## 2020-10-09 NOTE — Progress Notes (Signed)
120cc of Fentanyl wasted in Stericycle with Allene Pyo RN.

## 2020-10-09 NOTE — Progress Notes (Signed)
ANTICOAGULATION CONSULT NOTE  Pharmacy Consult for bivalirudin Indication: ECMO + bilateral DVTs + [facility] 12/26   Recent Labs    10/08/20 0438 10/08/20 0519 10/08/20 1624 10/08/20 2012 10/09/20 0014 10/09/20 0145 10/09/20 0414  HGB 7.5*   < > 7.3*   < > 8.2* 7.5* 8.3*  HCT 24.7*   < > 23.3*   < > 24.0* 22.0* 27.1*  PLT 194  --  184  --   --   --  187  APTT 56*  --  46*  --   --   --  55*  CREATININE 1.09  --  1.16  --   --   --  1.16   < > = values in this interval not displayed.    Estimated Creatinine Clearance: 78.4 mL/min (by C-G formula based on SCr of 1.16 mg/dL).   Assessment: 63 yom unvaccinated presenting with severe COVID ARDs, intubated on 12/6, continued to have ventilation issues despite NMB and proning - started on VV ECMO on 12/8. Pt noted to have bilateral DVTs 12/8. Bivalirudin started for ECMO anticoagulation. Head CT 12/26 with new small SAH, repeat 12/27 stable without expansion. Repeat heat CT 1/5 SAH improved.   APTT now therapeutic at 55 sec, CBC stable.  Goal of Therapy:  aPTT 50-70 seconds  Monitor platelets by anticoagulation protocol: Yes   Plan:  Continue bivalirudin 0.06 mg/kg/hr Recheck aPTTs every 5a & 5p with ECMO labs.  Fredonia Highland, PharmD, BCPS, [facility] Clinical Pharmacist [phone] Please check AMION for all [facility] Pharmacy numbers 10/09/2020

## 2020-10-09 NOTE — Progress Notes (Signed)
Orthopedic Tech Progress Note Patient Details:  Donald Rowe 1961/01/12 [id number] Secretary called requesting a PAIR OF PRAFO BOOTS for patient who has DROP FOOT Ortho Devices Type of Ortho Device: Prafo boot/shoe Ortho Device/Splint Location: BLE Ortho Device/Splint Interventions: Ordered,Application,Adjustment   Post Interventions Patient Tolerated: Well Instructions Provided: Care of device   Donald Pore 10/09/2020, 10:32 AM

## 2020-10-09 NOTE — Progress Notes (Signed)
Physical Therapy Treatment Patient Details Name: Donald Rowe MRN: [id number] DOB: 1961-01-12 Today's Date: 10/09/2020    History of Present Illness 60 y.o. male with a pertinent history of hypothyroidism on levothyroxine and hyperlipidemia who was diagnosed with Covid 11/27 and is not vaccinated.  With EMS SaO2 on RA 78%O2. ED patient was increased to 6 L oxygen satting about 92%,. CTA of the Chest was assessed and negative for pulmonary embolism but showed diffuse bilateral infiltrates. He was treated with IV remdesivir, steroids and baricitinib.  Initially, he was also treated with antibiotics but these were stopped on 12/6 with a low procalcitonin. 12/07 intubated, central line, A line, 12/08 ECMO cannulation, cortrak, attempted extubation 12/9 failed reintubated. Trach placed 12/16. CCRT started 12/26. 09/30/20 CRRT stopped 1/5 CT shows resolution of SAH, severe bilateral airspace disease    PT Comments    PT worked with ECMO specialist, and RN to sit EoB. Pt requires total A for coming to EoB. Pt tolerated sitting EoB 12 min with mod-maxAx2-3 for maintaining balance. Pt with eyes open entirety of session but is not able to focus on anything, slowed blink reflex to R eye no blink reflex on L. Pt with spontaneous head movement to therapist voice, however only follows 10% of commands and very delayed response to commands he does follow. Worked on weightbearing through UE with lateral lean on extend arm with blocking of elbow. After 12 minutes pt requires total A and returned to bed with total A. D/c plans remain appropriate at this time. PT will continue to follow acutely.    Follow Up Recommendations  CIR     Equipment Recommendations   (TBD)       Precautions / Restrictions Precautions Precautions: Other (comment) Precaution Comments: ECMO, cortrak, vent via trach, cath Restrictions Other Position/Activity Restrictions: safety mittens    Mobility  Bed Mobility Overal bed mobility:  Needs Assistance Bed Mobility: Supine to Sit;Sit to Supine     Supine to sit: Total assist;+2 for physical assistance;+2 for safety/equipment Sit to supine: Total assist;+2 for physical assistance;+2 for safety/equipment   General bed mobility comments: patient able to sit EOB this date with PT, ECMO specialist, and RN  Transfers                 General transfer comment: not attemtped        Balance Overall balance assessment: Needs assistance Sitting-balance support: No upper extremity supported;Feet supported Sitting balance-Leahy Scale: Zero Sitting balance - Comments: pt with 1x response to command to right himself, able to progress to min Ax2 to maintain sitting balance Postural control: Posterior lean;Right lateral lean;Left lateral lean                                  Cognition Arousal/Alertness: Lethargic Behavior During Therapy: Restless;Impulsive                                   General Comments: was following 10% of commands - very delayed response to commands      Exercises Other Exercises Other Exercises: responds to direction of PT voice with spontaneous cervical rotation, unable to focus on PT    General Comments General comments (skin integrity, edema, etc.): continues to have spontaneous movement in all 4 extremities R>L, decreased command follow today, restless with movement, pitching forward and to the L occasionally  Pertinent Vitals/Pain Pain Assessment: Faces Faces Pain Scale: Hurts whole lot Pain Location: generalized with limb movement Pain Descriptors / Indicators: Grimacing Pain Intervention(s): Limited activity within patient's tolerance;Monitored during session;Repositioned           PT Goals (current goals can now be found in the care plan section) Acute Rehab PT Goals PT Goal Formulation: Patient unable to participate in goal setting Time For Goal Achievement: 10/06/2020 Potential to Achieve  Goals: Poor Progress towards PT goals: Progressing toward goals    Frequency    Min 3X/week      PT Plan Current plan remains appropriate       AM-PAC PT "6 Clicks" Mobility   Outcome Measure  Help needed turning from your back to your side while in a flat bed without using bedrails?: Total Help needed moving from lying on your back to sitting on the side of a flat bed without using bedrails?: Total Help needed moving to and from a bed to a chair (including a wheelchair)?: Total Help needed standing up from a chair using your arms (e.g., wheelchair or bedside chair)?: Total Help needed to walk in hospital room?: Total Help needed climbing 3-5 steps with a railing? : Total 6 Click Score: 6    End of Session   Activity Tolerance: Patient limited by fatigue Patient left: with nursing/sitter in room;in bed;with call bell/phone within reach;with restraints reapplied Nurse Communication: Mobility status PT Visit Diagnosis: Muscle weakness (generalized) (M62.81);Difficulty in walking, not elsewhere classified (R26.2)     Time: 1610-9604 PT Time Calculation (min) (ACUTE ONLY): 23 min  Charges:  $Therapeutic Activity: 23-37 mins                     Donald Rowe B. Donald Rowe PT, DPT Acute Rehabilitation Services Pager [phone] Office [phone]    Elon Alas Fleet 10/09/2020, 4:21 PM

## 2020-10-10 ENCOUNTER — Inpatient Hospital Stay (HOSPITAL_COMMUNITY): Payer: HRSA Program

## 2020-10-10 DIAGNOSIS — J1282 Pneumonia due to coronavirus disease 2019: Secondary | ICD-10-CM | POA: Diagnosis not present

## 2020-10-10 DIAGNOSIS — J9601 Acute respiratory failure with hypoxia: Secondary | ICD-10-CM | POA: Diagnosis not present

## 2020-10-10 DIAGNOSIS — U071 COVID-19: Secondary | ICD-10-CM | POA: Diagnosis not present

## 2020-10-10 DIAGNOSIS — R451 Restlessness and agitation: Secondary | ICD-10-CM

## 2020-10-10 DIAGNOSIS — L8990 Pressure ulcer of unspecified site, unspecified stage: Secondary | ICD-10-CM | POA: Insufficient documentation

## 2020-10-10 LAB — POCT I-STAT 7, (LYTES, BLD GAS, ICA,H+H)
Acid-Base Excess: 6 mmol/L — ABNORMAL HIGH (ref 0.0–2.0)
Acid-Base Excess: 9 mmol/L — ABNORMAL HIGH (ref 0.0–2.0)
Acid-Base Excess: 9 mmol/L — ABNORMAL HIGH (ref 0.0–2.0)
Acid-Base Excess: 11 mmol/L — ABNORMAL HIGH (ref 0.0–2.0)
Acid-Base Excess: 9 mmol/L — ABNORMAL HIGH (ref 0.0–2.0)
Acid-Base Excess: 8 mmol/L — ABNORMAL HIGH (ref 0.0–2.0)
Bicarbonate: 32.7 mmol/L — ABNORMAL HIGH (ref 20.0–28.0)
Bicarbonate: 35.3 mmol/L — ABNORMAL HIGH (ref 20.0–28.0)
Bicarbonate: 34.9 mmol/L — ABNORMAL HIGH (ref 20.0–28.0)
Bicarbonate: 36.9 mmol/L — ABNORMAL HIGH (ref 20.0–28.0)
Bicarbonate: 35.3 mmol/L — ABNORMAL HIGH (ref 20.0–28.0)
Bicarbonate: 34 mmol/L — ABNORMAL HIGH (ref 20.0–28.0)
Calcium, Ion: 1.35 mmol/L (ref 1.15–1.40)
Calcium, Ion: 1.33 mmol/L (ref 1.15–1.40)
Calcium, Ion: 1.33 mmol/L (ref 1.15–1.40)
Calcium, Ion: 1.3 mmol/L (ref 1.15–1.40)
Calcium, Ion: 1.33 mmol/L (ref 1.15–1.40)
Calcium, Ion: 1.36 mmol/L (ref 1.15–1.40)
HCT: 22 % — ABNORMAL LOW (ref 39.0–52.0)
HCT: 22 % — ABNORMAL LOW (ref 39.0–52.0)
HCT: 19 % — ABNORMAL LOW (ref 39.0–52.0)
HCT: 18 % — ABNORMAL LOW (ref 39.0–52.0)
HCT: 22 % — ABNORMAL LOW (ref 39.0–52.0)
HCT: 20 % — ABNORMAL LOW (ref 39.0–52.0)
Hemoglobin: 7.5 g/dL — ABNORMAL LOW (ref 13.0–17.0)
Hemoglobin: 7.5 g/dL — ABNORMAL LOW (ref 13.0–17.0)
Hemoglobin: 6.5 g/dL — CL (ref 13.0–17.0)
Hemoglobin: 6.1 g/dL — CL (ref 13.0–17.0)
Hemoglobin: 7.5 g/dL — ABNORMAL LOW (ref 13.0–17.0)
Hemoglobin: 6.8 g/dL — CL (ref 13.0–17.0)
O2 Saturation: 95 %
O2 Saturation: 91 %
O2 Saturation: 98 %
O2 Saturation: 98 %
O2 Saturation: 92 %
O2 Saturation: 94 %
Patient temperature: 99.1
Patient temperature: 37
Patient temperature: 36.9
Patient temperature: 36.8
Patient temperature: 37
Patient temperature: 98.6
Potassium: 3.9 mmol/L (ref 3.5–5.1)
Potassium: 4.2 mmol/L (ref 3.5–5.1)
Potassium: 3.9 mmol/L (ref 3.5–5.1)
Potassium: 4.5 mmol/L (ref 3.5–5.1)
Potassium: 4.3 mmol/L (ref 3.5–5.1)
Potassium: 4 mmol/L (ref 3.5–5.1)
Sodium: 157 mmol/L — ABNORMAL HIGH (ref 135–145)
Sodium: 160 mmol/L — ABNORMAL HIGH (ref 135–145)
Sodium: 159 mmol/L — ABNORMAL HIGH (ref 135–145)
Sodium: 158 mmol/L — ABNORMAL HIGH (ref 135–145)
Sodium: 160 mmol/L — ABNORMAL HIGH (ref 135–145)
Sodium: 160 mmol/L — ABNORMAL HIGH (ref 135–145)
TCO2: 34 mmol/L — ABNORMAL HIGH (ref 22–32)
TCO2: 37 mmol/L — ABNORMAL HIGH (ref 22–32)
TCO2: 37 mmol/L — ABNORMAL HIGH (ref 22–32)
TCO2: 39 mmol/L — ABNORMAL HIGH (ref 22–32)
TCO2: 37 mmol/L — ABNORMAL HIGH (ref 22–32)
TCO2: 36 mmol/L — ABNORMAL HIGH (ref 22–32)
pCO2 arterial: 60.5 mmHg — ABNORMAL HIGH (ref 32.0–48.0)
pCO2 arterial: 58.4 mmHg — ABNORMAL HIGH (ref 32.0–48.0)
pCO2 arterial: 54.7 mmHg — ABNORMAL HIGH (ref 32.0–48.0)
pCO2 arterial: 57.4 mmHg — ABNORMAL HIGH (ref 32.0–48.0)
pCO2 arterial: 60.6 mmHg — ABNORMAL HIGH (ref 32.0–48.0)
pCO2 arterial: 57.1 mmHg — ABNORMAL HIGH (ref 32.0–48.0)
pH, Arterial: 7.341 — ABNORMAL LOW (ref 7.350–7.450)
pH, Arterial: 7.389 (ref 7.350–7.450)
pH, Arterial: 7.413 (ref 7.350–7.450)
pH, Arterial: 7.415 (ref 7.350–7.450)
pH, Arterial: 7.373 (ref 7.350–7.450)
pH, Arterial: 7.383 (ref 7.350–7.450)
pO2, Arterial: 83 mmHg (ref 83.0–108.0)
pO2, Arterial: 63 mmHg — ABNORMAL LOW (ref 83.0–108.0)
pO2, Arterial: 100 mmHg (ref 83.0–108.0)
pO2, Arterial: 108 mmHg (ref 83.0–108.0)
pO2, Arterial: 67 mmHg — ABNORMAL LOW (ref 83.0–108.0)
pO2, Arterial: 73 mmHg — ABNORMAL LOW (ref 83.0–108.0)

## 2020-10-10 LAB — RENAL FUNCTION PANEL
Albumin: 2.4 g/dL — ABNORMAL LOW (ref 3.5–5.0)
Albumin: 2.4 g/dL — ABNORMAL LOW (ref 3.5–5.0)
Anion gap: 10 (ref 5–15)
Anion gap: 9 (ref 5–15)
BUN: 92 mg/dL — ABNORMAL HIGH (ref 6–20)
BUN: 92 mg/dL — ABNORMAL HIGH (ref 6–20)
CO2: 29 mmol/L (ref 22–32)
CO2: 31 mmol/L (ref 22–32)
Calcium: 9.2 mg/dL (ref 8.9–10.3)
Calcium: 9 mg/dL (ref 8.9–10.3)
Chloride: 116 mmol/L — ABNORMAL HIGH (ref 98–111)
Chloride: 116 mmol/L — ABNORMAL HIGH (ref 98–111)
Creatinine, Ser: 1.14 mg/dL (ref 0.61–1.24)
Creatinine, Ser: 1.14 mg/dL (ref 0.61–1.24)
GFR, Estimated: >60 mL/min (ref >60)
GFR, Estimated: >60 mL/min (ref >60)
Glucose, Bld: 109 mg/dL — ABNORMAL HIGH (ref 70–99)
Glucose, Bld: 131 mg/dL — ABNORMAL HIGH (ref 70–99)
Phosphorus: 3.8 mg/dL (ref 2.5–4.6)
Phosphorus: 3.6 mg/dL (ref 2.5–4.6)
Potassium: 3.9 mmol/L (ref 3.5–5.1)
Potassium: 4.3 mmol/L (ref 3.5–5.1)
Sodium: 155 mmol/L — ABNORMAL HIGH (ref 135–145)
Sodium: 156 mmol/L — ABNORMAL HIGH (ref 135–145)

## 2020-10-10 LAB — APTT
aPTT: 52 seconds — ABNORMAL HIGH (ref 24–36)
aPTT: 58 seconds — ABNORMAL HIGH (ref 24–36)

## 2020-10-10 LAB — CBC
HCT: 26.1 % — ABNORMAL LOW (ref 39.0–52.0)
HCT: 24.6 % — ABNORMAL LOW (ref 39.0–52.0)
HCT: 26.7 % — ABNORMAL LOW (ref 39.0–52.0)
Hemoglobin: 7.8 g/dL — ABNORMAL LOW (ref 13.0–17.0)
Hemoglobin: 7.5 g/dL — ABNORMAL LOW (ref 13.0–17.0)
Hemoglobin: 8 g/dL — ABNORMAL LOW (ref 13.0–17.0)
MCH: 30.6 pg (ref 26.0–34.0)
MCH: 31 pg (ref 26.0–34.0)
MCH: 30 pg (ref 26.0–34.0)
MCHC: 29.9 g/dL — ABNORMAL LOW (ref 30.0–36.0)
MCHC: 30.5 g/dL (ref 30.0–36.0)
MCHC: 30 g/dL (ref 30.0–36.0)
MCV: 102.4 fL — ABNORMAL HIGH (ref 80.0–100.0)
MCV: 101.7 fL — ABNORMAL HIGH (ref 80.0–100.0)
MCV: 100 fL (ref 80.0–100.0)
Platelets: 174 10*3/uL (ref 150–400)
Platelets: 213 10*3/uL (ref 150–400)
Platelets: 161 10*3/uL (ref 150–400)
RBC: 2.55 MIL/uL — ABNORMAL LOW (ref 4.22–5.81)
RBC: 2.42 MIL/uL — ABNORMAL LOW (ref 4.22–5.81)
RBC: 2.67 MIL/uL — ABNORMAL LOW (ref 4.22–5.81)
RDW: 17.6 % — ABNORMAL HIGH (ref 11.5–15.5)
RDW: 18 % — ABNORMAL HIGH (ref 11.5–15.5)
RDW: 18.4 % — ABNORMAL HIGH (ref 11.5–15.5)
WBC: 10.6 10*3/uL — ABNORMAL HIGH (ref 4.0–10.5)
WBC: 8.3 10*3/uL (ref 4.0–10.5)
WBC: 10.2 10*3/uL (ref 4.0–10.5)
nRBC: 0.6 % — ABNORMAL HIGH (ref 0.0–0.2)
nRBC: 0.7 % — ABNORMAL HIGH (ref 0.0–0.2)
nRBC: 0.4 % — ABNORMAL HIGH (ref 0.0–0.2)

## 2020-10-10 LAB — LACTIC ACID, PLASMA
Lactic Acid, Venous: 0.7 mmol/L (ref 0.5–1.9)
Lactic Acid, Venous: 1 mmol/L (ref 0.5–1.9)

## 2020-10-10 LAB — HEPATIC FUNCTION PANEL
ALT: 42 U/L (ref 0–44)
AST: 41 U/L (ref 15–41)
Albumin: 2.4 g/dL — ABNORMAL LOW (ref 3.5–5.0)
Alkaline Phosphatase: 201 U/L — ABNORMAL HIGH (ref 38–126)
Bilirubin, Direct: 0.2 mg/dL (ref 0.0–0.2)
Indirect Bilirubin: 0.7 mg/dL (ref 0.3–0.9)
Total Bilirubin: 0.9 mg/dL (ref 0.3–1.2)
Total Protein: 6.2 g/dL — ABNORMAL LOW (ref 6.5–8.1)

## 2020-10-10 LAB — PREPARE RBC (CROSSMATCH)

## 2020-10-10 LAB — MAGNESIUM: Magnesium: 2.8 mg/dL — ABNORMAL HIGH (ref 1.7–2.4)

## 2020-10-10 LAB — LACTATE DEHYDROGENASE: LDH: 541 U/L — ABNORMAL HIGH (ref 98–192)

## 2020-10-10 LAB — FIBRINOGEN: Fibrinogen: 377 mg/dL (ref 210–475)

## 2020-10-10 LAB — GLUCOSE, CAPILLARY
Glucose-Capillary: 105 mg/dL — ABNORMAL HIGH (ref 70–99)
Glucose-Capillary: 123 mg/dL — ABNORMAL HIGH (ref 70–99)
Glucose-Capillary: 130 mg/dL — ABNORMAL HIGH (ref 70–99)
Glucose-Capillary: 132 mg/dL — ABNORMAL HIGH (ref 70–99)
Glucose-Capillary: 134 mg/dL — ABNORMAL HIGH (ref 70–99)
Glucose-Capillary: 94 mg/dL (ref 70–99)

## 2020-10-10 MED ORDER — POTASSIUM CHLORIDE 20 MEQ PO PACK
40.0000 meq | PACK | Freq: Once | ORAL | Status: AC
  Administered 2020-10-10: 40 meq
  Filled 2020-10-10: qty 2

## 2020-10-10 MED ORDER — SODIUM CHLORIDE 0.9% IV SOLUTION: Freq: Once | INTRAVENOUS | Status: AC

## 2020-10-10 NOTE — Progress Notes (Signed)
ANTICOAGULATION CONSULT NOTE  Pharmacy Consult for bivalirudin Indication: ECMO + bilateral DVTs + [facility] 12/26   Recent Labs    10/09/20 1542 10/09/20 1843 10/10/20 0413 10/10/20 0414 10/10/20 1618 10/10/20 1724 10/10/20 1827  HGB 8.6*   < > 7.8*   < > 7.5* 6.1* 7.5*  HCT 26.4*   < > 26.1*   < > 24.6* 18.0* 22.0*  PLT 196  --  174  --  213  --   --   APTT 56*  --  52*  --  58*  --   --   CREATININE 1.12  --  1.14  --  1.14  --   --    < > = values in this interval not displayed.    Estimated Creatinine Clearance: 81.6 mL/min (by C-G formula based on SCr of 1.14 mg/dL).   Assessment: 26 yom unvaccinated presenting with severe COVID ARDs, intubated on 12/6, continued to have ventilation issues despite NMB and proning - started on VV ECMO on 12/8. Pt noted to have bilateral DVTs 12/8. Bivalirudin started for ECMO anticoagulation. Head CT 12/26 with new small SAH, repeat 12/27 stable without expansion. Repeat heat CT 1/5 SAH improved.   APTT now therapeutic at 58 sec on bivalirudin drip 0.06mg /kg/hr, h/h low with prn PRBC last 1/9 but overall stable, no overt bleeding, pltc 150-200.  LDH stable 500s, fibrinogen stable 300s  Goal of Therapy:  aPTT 50-70 seconds  Monitor platelets by anticoagulation protocol: Yes   Plan:  Continue bivalirudin 0.06 mg/kg/hr Recheck aPTTs every 5a & 5p with ECMO labs.    Leota Sauers Pharm.D. CPP, BCPS Clinical Pharmacist [phone] 10/10/2020 7:28 PM    Please check AMION for all [facility] Pharmacy numbers 10/10/2020

## 2020-10-10 NOTE — Progress Notes (Signed)
   Palliative Medicine Inpatient Follow Up Note  Reason for consult:  "ECMO"  HPI:  Per intake H&P --> Donald Rowe a 59 y.o.malewith a pertinent history ofhypothyroidism on levothyroxine and hyperlipidemia who was diagnosed with Covid 1 week ago and is not vaccinated. With EMS he was seen to be desaturating to 78% on room air. He states he started having symptoms on Friday and tested positive on Saturday.  Had initially elected to be DNAR without intubation though later changed this and got intubated on 12/6  in the setting of worsening respiratory failure with hypoxia. CTA chest w/ diffuse bilateral infiltrates. On 12/8 was cannulated for VV ECMO. Had been extubated on 12/9 then emergently re intubated to clinical instability.     Palliative care was asked to get involved in the setting of serious illness/ ECMO to set goals and expectations   Today's Discussion (10/10/2020):  Chart reviewed.  Case reviewed with team.    Today day 38 his hospital stay.  Intermittently patient is able to follow some simple commands.  Continues with severe agitation at times  Patient remains weak     Sweep remains at 74     Dr. Gala Romney is expressing concern with ability to be liberated from ECMO and the likely sequelae medical event.  I spoke with [name] updated over the phone.   Created space and opportunity for [name] to explore her thoughts and feeling regarding her husbands current medical situation.  She understands the seriousness of the current medical situation.     She tells me that currently family is gathered as they have ongoing conversation regarding treatment options decision, advanced directive decisions and anticipatory care needs.  She voices appreciation for the support.    PMT will continue to support holistically.  I would like 1 know that I would reach out to her tomorrow and his family continue to navigate the difficult decisions regarding Donald Rowe's  treatment  plan  Emotional support offered.    Questions and concerns addressed    SUMMARY OF RECOMMENDATIONS Partial Code/Full Scope of Care  ECMO as a bridge to recovery  Spiritual support appreciated  Ongoing PMT support    Time Spent: 25 Greater than 50% of the time was spent in counseling and coordination of care ______________________________________________________________________________________ Lorinda Creed NP [facility] Palliative Medicine Team Team Cell Phone: [phone] Please utilize secure chat with additional questions, if there is no response within 30 minutes please call the above phone number  Palliative Medicine Team providers are available by phone from 7am to 7pm daily and can be reached through the team cell phone.  Should this patient require assistance outside of these hours, please call the patient's attending physician.

## 2020-10-10 NOTE — Progress Notes (Signed)
ANTICOAGULATION CONSULT NOTE  Pharmacy Consult for bivalirudin Indication: ECMO + bilateral DVTs + [facility] 12/26   Recent Labs    10/09/20 0414 10/09/20 1135 10/09/20 1542 10/09/20 1843 10/09/20 1957 10/10/20 0413 10/10/20 0414  HGB 8.3*   < > 8.6*   < > 7.5* 7.8* 7.5*  HCT 27.1*   < > 26.4*   < > 22.0* 26.1* 22.0*  PLT 187  --  196  --   --  174  --   APTT 55*  --  56*  --   --  52*  --   CREATININE 1.16  --  1.12  --   --  1.14  --    < > = values in this interval not displayed.    Estimated Creatinine Clearance: 81.6 mL/min (by C-G formula based on SCr of 1.14 mg/dL).   Assessment: 42 yom unvaccinated presenting with severe COVID ARDs, intubated on 12/6, continued to have ventilation issues despite NMB and proning - started on VV ECMO on 12/8. Pt noted to have bilateral DVTs 12/8. Bivalirudin started for ECMO anticoagulation. Head CT 12/26 with new small SAH, repeat 12/27 stable without expansion. Repeat heat CT 1/5 SAH improved.   APTT now therapeutic at 52 sec, CBC low but overall stable. Needs feeding tube from IR today so will need to hold bivalirudin x1h beforehand.  Goal of Therapy:  aPTT 50-70 seconds  Monitor platelets by anticoagulation protocol: Yes   Plan:  Continue bivalirudin 0.06 mg/kg/hr Recheck aPTTs every 5a & 5p with ECMO labs.  Fredonia Highland, PharmD, BCPS, [facility] Clinical Pharmacist [phone] Please check AMION for all [facility] Pharmacy numbers 10/10/2020

## 2020-10-10 NOTE — Progress Notes (Signed)
Patient ID: Donald Rowe, male   DOB: 01/24/1961, 60 y.o.   MRN: [id number]    Advanced Heart Failure Rounding Note   Subjective:    12/8 Cannulated for VV ECMO - 32 FR  RIJ [place] 12/9 Extubated/Reintubated for altered mental status 12/13 Circuit change for elevated LDH 12/15 PLEX started -> completed 12/22 12/16 Tracheostomy 12/23 head CT (normal) for change in mental status.  12/26 Pan CT for drop in hgb. Small SAH. Severe lung disease 12/27 CVVH begun 1/1 CVVH stopped 1/3 Foley placed for urinary retention 1/5  CT head and chest - resolution of SAH. Severe bilateral airspace disease, slightly improved. Pneumomediastinum resolved.   More alert this am. Able to squeeze my hand to command but unable to follow further directions. Continues with severe agitation at times. Remains on Precedex and oxycodone.   Staff able to get him sitting up on edge of bed yesterday but was very weak and described as "floppy". Had to lie him back down quickly as he dropped his HR and sats.   Sweep remains at 9. On vent 50% TVs in the 250 range. Respiratory rate frequently in the 40s.   On Abx for MRSA in sputum.  Remains on lasix 40 IV daily. BUN rising   CXR this am slightly worse.   ECMO   Speed 4050 Flow 4.92 L Sweep 9 dP 26 pVen -77  ABG: 7.34/61/77/94% Hgb 7.8 LDH 434 -> 488 -> 514 -> 498 -> 519 -> 508 -> 542 -> 541 Lactic acid 0.7 PTT 52  Objective:   Weight Range:  Vital Signs:   Temp:  [98.6 F (37 C)-99.1 F (37.3 C)] 98.6 F (37 C) (01/11 1200) Pulse Rate:  [98-128] 112 (01/11 1101) Resp:  [17-46] 28 (01/11 1101) BP: (108-196)/(44-73) 182/68 (01/11 1200) SpO2:  [93 %-98 %] 96 % (01/11 1101) Arterial Line BP: (108-196)/(29-73) 150/51 (01/11 1100) FiO2 (%):  [50 %] 50 % (01/11 1200) Weight:  [100.6 kg] 100.6 kg (01/11 0500) Last BM Date: 10/11/19  Weight change: Filed Weights   10/08/20 0645 10/09/20 0600 10/10/20 0500  Weight: 95.9 kg 98 kg 100.6 kg     Intake/Output:   Intake/Output Summary (Last 24 hours) at 10/10/2020 1256 Last data filed at 10/10/2020 1215 Gross per 24 hour  Intake 4128.11 ml  Output 3575 ml  Net 553.11 ml     Physical Exam: General:  Awake on vent. Tachypneic Daugheer at bedside  HEENT: normal +cor-trak Neck: supple. RIJ ECMO cannula (sutures loose) + trach Carotids 2+ bilat; no bruits. No lymphadenopathy or thryomegaly appreciated. Cor: PMI nondisplaced. Regular tachy No rubs, gallops or murmurs. Lungs: tachypneic. coarse Abdomen: soft, nontender, nondistended. No hepatosplenomegaly. No bruits or masses. Good bowel sounds. Extremities: no cyanosis, clubbing, rash, edema Neuro: alert will follow simple commands. Easily agitated   Telemetry: sinus [id number] s Personally reviewed     Labs: Basic Metabolic Panel: Recent Labs  Lab 10/06/20 0317 10/06/20 0515 10/07/20 0314 10/07/20 0516 10/08/20 0438 10/08/20 0519 10/08/20 1624 10/08/20 2012 10/09/20 0414 10/09/20 1135 10/09/20 1542 10/09/20 1843 10/09/20 1957 10/10/20 0413 10/10/20 0414 10/10/20 1237  NA 148*   < > 153*   < > 151*   < > 154*   < > 153*   < > 151* 155* 155* 155* 157* 160*  K 3.6   < > 4.3   < > 3.6   < > 3.7   < > 4.0   < > 3.9 4.1 3.8 3.9 3.9 4.2  CL  111   < > 115*   < > 114*  --  114*  --  116*  --  111  --   --  116*  --   --   CO2 28   < > 29   < > 28  --  31  --  30  --  31  --   --  29  --   --   GLUCOSE 162*   < > 137*   < > 162*  --  127*  --  161*  --  126*  --   --  109*  --   --   BUN 64*   < > 73*   < > 83*  --  87*  --  89*  --  90*  --   --  92*  --   --   CREATININE 1.13   < > 1.08   < > 1.09  --  1.16  --  1.16  --  1.12  --   --  1.14  --   --   CALCIUM 9.0   < > 9.3   < > 8.8*  --  8.9  --  8.9  --  9.0  --   --  9.2  --   --   MG 2.4  --  2.8*  --  2.7*  --   --   --  2.8*  --   --   --   --  2.8*  --   --   PHOS 3.4   < > 2.6   < > 2.7  --  3.7  --  3.5  --  3.6  --   --  3.8  --   --    < > = values in  this interval not displayed.    Liver Function Tests: Recent Labs  Lab 10/08/20 0438 10/08/20 1624 10/09/20 0414 10/09/20 1542 10/10/20 0413  AST  --   --   --   --  41  ALT  --   --   --   --  42  ALKPHOS  --   --   --   --  201*  BILITOT  --   --   --   --  0.9  PROT  --   --   --   --  6.2*  ALBUMIN 2.3* 2.4* 2.4* 2.5* 2.4*  2.4*   No results for input(s): LIPASE, AMYLASE in the last 168 hours. No results for input(s): AMMONIA in the last 168 hours.  CBC: Recent Labs  Lab 10/08/20 0438 10/08/20 0519 10/08/20 1624 10/08/20 2012 10/09/20 0414 10/09/20 1135 10/09/20 1542 10/09/20 1843 10/09/20 1957 10/10/20 0413 10/10/20 0414 10/10/20 1237  WBC 7.3  --  7.7  --  9.0  --  11.1*  --   --  10.6*  --   --   HGB 7.5*   < > 7.3*   < > 8.3*   < > 8.6* 7.5* 7.5* 7.8* 7.5* 7.5*  HCT 24.7*   < > 23.3*   < > 27.1*   < > 26.4* 22.0* 22.0* 26.1* 22.0* 22.0*  MCV 100.8*  --  100.4*  --  101.5*  --  99.6  --   --  102.4*  --   --   PLT 194  --  184  --  187  --  196  --   --  174  --   --    < > =  values in this interval not displayed.    Cardiac Enzymes: No results for input(s): CKTOTAL, CKMB, CKMBINDEX, TROPONINI in the last 168 hours.  BNP: BNP (last 3 results) No results for input(s): BNP in the last 8760 hours.  ProBNP (last 3 results) No results for input(s): PROBNP in the last 8760 hours.    Other results:  Imaging: DG CHEST PORT 1 VIEW  Result Date: 10/10/2020 CLINICAL DATA:  ECMO.  COVID. EXAM: PORTABLE CHEST 1 VIEW COMPARISON:  10/09/2020. FINDINGS: Left PICC line noted with tip over right atrium. Tracheostomy tube, feeding tube, in stable position. ECMO device in stable position. Heart size stable. Low lung volumes. Diffuse severe bilateral pulmonary infiltrates/edema. No pleural effusion or pneumothorax. IMPRESSION: 1. Left PICC line noted with tip over right atrium. Tracheostomy tube, feeding tube, ECMO device in stable position. 2. Low lung volumes.  Diffuse severe bilateral pulmonary infiltrates again noted. Electronically Signed   By: Marcello Moores  Register   On: 10/10/2020 06:24   DG CHEST PORT 1 VIEW  Result Date: 10/09/2020 CLINICAL DATA:  ECMO EXAM: PORTABLE CHEST 1 VIEW COMPARISON:  Yesterday FINDINGS: Tracheostomy and feeding tubes in unremarkable in stable position. Unchanged ECMO catheter and left PICC position. Confluent bilateral pneumonia without interval change. Prominent heart size accentuated by fat based on CT. No visible effusion or air leak. IMPRESSION: Stable hardware positioning and confluent pneumonia. Electronically Signed   By: Monte Fantasia M.D.   On: 10/09/2020 06:19     Medications:     Scheduled Medications: . albuterol  2.5 mg Nebulization Q4H  . amLODipine  10 mg Per Tube q1800  . chlorhexidine gluconate (MEDLINE KIT)  15 mL Mouth Rinse BID  . Chlorhexidine Gluconate Cloth  6 each Topical Daily  . clonazePAM  0.5 mg Per Tube Q8H  . cloNIDine  0.1 mg Per Tube TID  . docusate  100 mg Per Tube BID  . feeding supplement (PROSource TF)  45 mL Per Tube QID  . fiber  1 packet Per Tube BID  . free water  200 mL Per Tube Q2H  . furosemide  40 mg Intravenous Daily  . insulin aspart  2-6 Units Subcutaneous Q4H  . levothyroxine  50 mcg Per Tube Q0600  . lidocaine  1 patch Transdermal Q24H  . mouth rinse  15 mL Mouth Rinse 10 times per day  . metoCLOPramide (REGLAN) injection  5 mg Intravenous Q12H  . metoprolol tartrate  50 mg Per Tube Q12H  . nystatin  5 mL Per Tube QID  . oxyCODONE  10 mg Per Tube Q4H  . pantoprazole sodium  40 mg Per Tube BID  . polyethylene glycol  17 g Per Tube BID  . pravastatin  40 mg Per Tube q1800  . QUEtiapine  150 mg Per Tube TID  . sodium chloride flush  10-40 mL Intracatheter Q12H  . sodium chloride HYPERTONIC  4 mL Nebulization Q4H    Infusions: . sodium chloride 10 mL/hr at 10/10/20 1200  . albumin human 12.5 g (09/29/20 1644)  . bivalirudin (ANGIOMAX) infusion 0.5 mg/mL  (Non-ACS indications) 0.06 mg/kg/hr (10/10/20 1200)  . dexmedetomidine (PRECEDEX) IV infusion 1.2 mcg/kg/hr (10/10/20 1200)  . feeding supplement (PIVOT 1.5 CAL) 70 mL/hr at 10/10/20 1000  . fentaNYL infusion INTRAVENOUS 0 mcg/hr (10/07/20 0913)  . vancomycin Stopped (10/09/20 2334)    PRN Medications: acetaminophen (TYLENOL) oral liquid 160 mg/5 mL, albumin human, albuterol, diphenhydrAMINE, fentaNYL, Gerhardt's butt cream, guaiFENesin-dextromethorphan, haloperidol lactate, labetalol, lip balm, LORazepam, midazolam, ondansetron **  OR** ondansetron (ZOFRAN) IV, sodium chloride flush   Assessment/Plan:   1. Acute hypoxic respiratory failure/ARDS in setting of COVID-19 PNA - Cannulated for VV ECMO on 12/8 after failing intubation x 2 days - has complete remedisivir, baricitinib, Solumedrol.  - s/p trach 12/16 - CT chest 12/26 with severe lung disease and pneumomediastinum.  - 1/5  CT head and chest - resolution of SAH. Severe bilateral airspace disease, slightly improved. Pneumomediastinum resolved.  - Restarted vanc/cefepime 1/6 with Staph in trach aspirate. Getting chest PT Still with significant secretions - Circuit changed 12/13 for elevated LDH and lower flows. PLEX started 12/15. S/p PLEX #4/4 on 12/21. LDH has been stable. Circuit parameters stable.   - Sweep remains at 9 today. Unable to wean     - CVVH started 12/27 with intractable volume overload, stopped 1/1. Creatinine stable but BUN high. I/Os 2 L negative yesterday.  Continue  Lasix to 40 mg IV daily today. BUN climbing. May need a break from diuretics soon +/- CVVHD again - Bivalirudin with PTT goal back up to 50-70. PTT 52 today Discussed dosing with PharmD personally. - Mental status improving very slwoly. Now off propofol with purposeful movement.  But remains markedly weak and tachypneic with little improvement in respiratory status. We are now nearly 5 weeks into his ECMO run. I remain very concerned that he may not have the  respiratory reserve to be liberated from ECMO any time soon.   I discussed the situation with his wife and daughter today. We discussed the fact that he is not close to being able to come off ECMO and given his agitation he has not been able to participate in PT and has become extremely weak.  I quoted them a 5-10% chance of him being able to come off ECMO in next 2 weeks (barring any other complications) and even if he was able to come off ECMO would likely require months in a SNF with ventilator support. They were very realistic and asked extremely good questions. They clearly want to give him a chance to recover but also they understand that he may not want to go through everything he is going through and a possible retracted recovery. We have changed his Code Status to limited code and we will continue discussions regarding how much further we want to go with this high-level of support.  2. Bilateral DVT - remains on bivalirudin - Dosing d/w PharmD, increased PTT goal back to 50-70.   3. DM2 - Insulin adjusted. CBGs improved   4. Obesity - Body mass index is 32.75 kg/m.  5. F/E/N - Ileus improved. Back on TFs - Tentative plan for feeding tube with IR today but will defer for today to give family more time to discuss next steps.   6. HTN - Remains very labile adjusting as needed  7. Rectal bleeding - follow closely. Rectal tube back in over the weekend for perfuse liquid stools. No bleeding currently  8. AKI - Now off CVVH, creatinine so far stable but BUN rising. Plan as above   9. Acute encephalopathy - remains agitated  - head CT ok 09/07/20 and 12/21 - head CT 12/26. Small SAH.  Repeat CT on 12/27 with unchanged small SAH.   10. Small SAH - on CT 12/26 - repeat CT 12/27 unchanged small SAH.  - PTT goal 50-70. 53 today - avoid severe HTN as best as possible  11. Gastric ileus - Getting Reglan IV. Resolved  12. Anemia - Transfuse  hgb < 7.5.  - Has been getting  periodic blood without overt GI bleeding.   13. Severe deconditioning/muscle weakness - PT has been limited due to agitation - Continue to try to mobilize as best as posible - No change   Plan discussed on multi-discplinary ECMO rounds with CCM, ECMO coordinator/specialist, PharmDs and RNs.  CRITICAL CARE Performed by: Glori Bickers  Total critical care time: 60 minutes  Critical care time was exclusive of separately billable procedures and treating other patients.  Critical care was necessary to treat or prevent imminent or life-threatening deterioration.  Critical care was time spent personally by me (independent of midlevel providers or residents) on the following activities: development of treatment plan with patient and/or surrogate as well as nursing, discussions with consultants, evaluation of patient's response to treatment, examination of patient, obtaining history from patient or surrogate, ordering and performing treatments and interventions, ordering and review of laboratory studies, ordering and review of radiographic studies, pulse oximetry and re-evaluation of patient's condition.    Length of Stay: [place] MD 10/10/2020, 12:56 PM  Advanced Heart Failure Team Pager [phone] (M-F; [place])  Please contact [facility] for night-coverage after hours (4p -7a ) and weekends on [email or web address]

## 2020-10-10 NOTE — Plan of Care (Signed)
  Problem: Fluid Volume: Goal: Hemodynamic stability will improve Outcome: Progressing   Problem: Clinical Measurements: Goal: Diagnostic test results will improve Outcome: Progressing Goal: Signs and symptoms of infection will decrease Outcome: Progressing   Problem: Clinical Measurements: Goal: Ability to maintain clinical measurements within normal limits will improve Outcome: Progressing Goal: Will remain free from infection Outcome: Progressing Goal: Diagnostic test results will improve Outcome: Progressing Goal: Respiratory complications will improve Outcome: Progressing Goal: Cardiovascular complication will be avoided Outcome: Progressing   Problem: Nutrition: Goal: Adequate nutrition will be maintained Outcome: Progressing

## 2020-10-10 NOTE — Progress Notes (Signed)
Per RN on floor, family is having a meeting today regarding goals of care. Hold gastric tube placement at this time.

## 2020-10-10 NOTE — Progress Notes (Signed)
NAME:  Donald Rowe, MRN:  [id number], DOB:  03/11/1961, LOS: 60 ADMISSION DATE:  09/20/2020, CONSULTATION DATE:  12/6 REFERRING MD:  Dr. Aileen Fass, CHIEF COMPLAINT:  SOB    Brief History   60 y/o M admitted 12/4 with 1 week hx of SOB, known COVID positive.  He is unvaccinated.  CTA chest negative for PE but demonstrated diffuse bilateral infiltrates. [place] started on 12/8   Past Medical History  Hypothyroidism  [facility] Events/Procedures  12/04 Admit  12/06 PCCM consulted  12/07 Intubated, central line, a line 12/08 VV ECMO Cannulaton, cortrak 12/13 circuit change for hemolytic anemia 12/16 Tracheostomy  12/17 bronch w/ BAL 1/5 mrsa Consults:  PCCM, Heart failure, TCTS, ECMO  Significant Diagnostic Tests:   CTA Chest 12/4 >> extensive bilateral airspace disease, no large PE identified, limited study   LE Venous Duplex 12/4 >> negative for DVT bilaterally   LE venous duplex 12/8> BLE DVTs  RUE venous duplex 12/23  RUE arterial duplex 12/23  CT head 12/26 : Minimal SAH on left side  CT abdomen and pelvis 12/26: Extensive bilateral airspace disease with small pleural effusion  CT head 12/27: Subarachnoid unchanged  Chest x-ray 1/1: Significant improvement in airspace disease  Micro Data:  COVID 12/4 >> negative  Influenza A/B 12/4 >> negative  MRSA PCR 12/5 >> negative  BCx2 12/4 >> NG Trach aspirate 12/12> rare MRSA BAL 12/17> MRSA, candida Aspergillus Ag BAL 12/17> 0.05 12/18 sars2: positive 1/5 vanc->  Antimicrobials:  Azithromycin 12/5 >> 12/6  Ceftriaxone 12/5 >> 12/6  Cefepime 12/7>12/14 vanc 12/14>12/20 Meropenem 12/17>12/21 vanc  Interim history/subjective:  1/11: remains on 9L sweep. Vent minimal. Family meeting with Dr Haroldine Laws 1/10: increased sweep overnight. Remains on vanc will set end dated for total of 10 days.   Objective   Blood pressure (!) 178/64, pulse (!) 128, temperature 98.6 F (37 C), temperature source  Core, resp. rate (!) 43, height _0  (1.753 m), weight 100.6 kg, SpO2 94 %.    Vent Mode: PCV FiO2 (%):  [50 %] 50 % Set Rate:  [10 bmp] 10 bmp PEEP:  [10 cmH20] 10 cmH20 Pressure Support:  [10 cmH20] 10 cmH20 Plateau Pressure:  [21 cmH20-29 cmH20] 28 cmH20   Intake/Output Summary (Last 24 hours) at 10/10/2020 1050 Last data filed at 10/10/2020 1000 Gross per 24 hour  Intake 3914.51 ml  Output 3650 ml  Net 264.51 ml   Filed Weights   10/08/20 0645 10/09/20 0600 10/10/20 0500  Weight: 95.9 kg 98 kg 100.6 kg    Examination: Gen: critically ill appearing man laying in bed in NAD, appears older than stated age.  HEENT: Pax/AT, eyes anicteric, eomi but not tracking today.  Neck: trach w/o bleeding  CV: Tachycardic, regular rhythm Pulm: Bilateral rales improving, tidal volumes around 250 cc. Appears comfortable today Abd: soft, NT, mildly distended Extm: no cyanosis or edema, compression stockings in place Skin: warm & dry, pallor. No rashes. Neuro: RASS -2, remains not following commands or too weak to. Doesn't appear to track either today  CXR personally reviewed> persistent bilateral airspace disease,    Resolved Hospital Problem list     Assessment & Plan:   Acute hypoxemic and hypercarbic respiratory failure secondary to COVID PNA with severe ARDS. S/p VV ECMO cannulation 09/04/2020.  S/p tracheostomy 27/06 complicated by trach site bleeding improved Trivial left-sided subarachnoid hemorrhage Community-acquired pneumonia with MRSA- completed treatment. LLL HAP-staph aureus -Completed remdesivir, solumedrol, and baricitnib. -Con't full ECMO  support, unable to consistently wean sweep -Completed treatment with 4 sessions of Plex for hemolysis -cont daily lasix for euvolemia goal -Completed 7 days vanc for MRSA pneumonia in early december.  -resumed vanc for repeated MRSA pneumonia.   Agitated delirium requiring titration of IV sedation.  Periods of severe agitation are  limiting ability to wean ECMO -minimize infusions> on precedex alone for continuous sedation -Con't enteral agents  -delirium  remains main limitation to rehabilitation at this point, pt profoundly weak with lengthy pump run, concern about ability to rehab to become strong enough to wean from ecmo at this time. Esp without consistent improvement on sweep.  -will attempt to increase vent support to wean, pending family discussions.   Bilateral lower ext DVTs- provoked by COVID infection -Continue bivalirudin; goal PTT 50-70 -needs 3 months AC for provoked DVT   Prediabetes- remains controlled  -a1c 6 -SSI PRN -goal BG 140-180  Hypothyroidism -con't PTA synthroid   Labile hypertension, contributed by periods of agitation.  Hypotension after meds. Not needing pressors or cardene at this point. -space out sedating meds and antihypertensives to avoid hypotension and oversedation in light of rising co2 -con't clonidine, amlodipine, and metoprolol -agitation control helps moderate BP -on precedex alone for continuous sedation but decreased dosing  Hemolytic anemia - improved. More expected down trending of Hb with expected losses associated with ECMO Throbocytopenia Hyperbilirubinemia Rheumatologic-associated MAHA remains in differential.  hgb and plt are stable -Completed treatment with the Plex for 4 sessions -Transfuse if hgb <7.5. Recheck later.  Vomiting- resolved -con't reglan -TF were at goal but held with planned peg. Will resume but have asked nutrition to alter to decrease carbs on tf ? If escalating co2 is 2/2 overfeeding with higher carbs.  -holding peg placement at this time   [place]- suspect hypertensive in etiology> resolved on follow up imaging -PTT goals 50-70  Hypernatremia -fwf held in light of planned peg but now that is held will resume -con't to monitor  Deconditioning -Long road of rehab ahead if able to tolerate. Pt's agitation and delirium are the limiting  factors to improving strength. Need for sedation with cannulation but inability to wean circuit for decannuylation 2/2 delirium and overall weakness.  -challenging case, ideally will be able to utilize lungs at this juncture to wean circuit  -Dr Mahalia Longest met with family today who are considering pt's previously expressed wishes and goals for improvement to baseline. He expressed he will need aggressive rehab and potential nh placement for some time if he survives.  -code status changed to DNR -holding on peg tube in light of this  Best practice (evaluated daily)  Diet: TF on hold for peg Pain/Anxiety/Delirium protocol (if indicated): PAD protocol, limit IV meds VAP protocol (if indicated): yes DVT prophylaxis: Bivalirudin GI prophylaxis: pantoprazole BID Glucose control: SSI Mobility: As tolerated Last date of multidisciplinary goals of care discussion: 10/10/20 with wife [name] at bedside, RN, ECMO specailist Summary of discussion con't aggressive care Follow up goals of care discussion due: 1/17 Code Status: DNR  Disposition: ICU  Critical care time: The patient is critically ill with multiple organ systems failure and requires high complexity decision making for assessment and support, frequent evaluation and titration of therapies, application of advanced monitoring technologies and extensive interpretation of multiple databases.  Critical care time 48mns. This represents my time independent of the NPs time taking care of the pt. This is excluding procedures.    JAudria NineDO [facility] 10/10/2020, 10:50  AM

## 2020-10-10 NOTE — Procedures (Signed)
Extracorporeal support note     ECLS cannulation: 17-Sep-2020 Last circuit change: 09/11/2020  Indication: COVID ARDS   Configuration: VV, RIJ 32Fr [place]   Pump speed: 4200 Pump flow: 4.3 L Pump used: Cardiohelp   Sweep gas: 100%, 9 LPM   Circuit check: minimal clot Anticoagulant: bivalirudin   Changes in support: Con't aggressive care. Con't antibiotics for MRSA pneumonia. PTT goal 50-70 given previous SAH. Minimize IV infusions, con't with scheduled and PRN agitaiton meds. Holding peg tube placement for family requested goals of care discussion.  Pt profoundly weak.   anticipated goals/duration of support: Bridge to recovery, day 34 of support.  Briant Sites, DO 10/10/20 10:49 AM  Pulmonary & Critical Care

## 2020-10-10 NOTE — Progress Notes (Signed)
Hold on PEG tube for now per Bensimhon MD. Wife made aware.

## 2020-10-11 ENCOUNTER — Inpatient Hospital Stay (HOSPITAL_COMMUNITY): Payer: HRSA Program

## 2020-10-11 DIAGNOSIS — J1282 Pneumonia due to coronavirus disease 2019: Secondary | ICD-10-CM | POA: Diagnosis not present

## 2020-10-11 DIAGNOSIS — U071 COVID-19: Secondary | ICD-10-CM | POA: Diagnosis not present

## 2020-10-11 DIAGNOSIS — J9601 Acute respiratory failure with hypoxia: Secondary | ICD-10-CM | POA: Diagnosis not present

## 2020-10-11 LAB — TYPE AND SCREEN
ABO/RH(D): O POS
Antibody Screen: NEGATIVE
Unit division: 0
Unit division: 0
Unit division: 0
Unit division: 0

## 2020-10-11 LAB — POCT I-STAT 7, (LYTES, BLD GAS, ICA,H+H)
Acid-Base Excess: 6 mmol/L — ABNORMAL HIGH (ref 0.0–2.0)
Acid-Base Excess: 7 mmol/L — ABNORMAL HIGH (ref 0.0–2.0)
Acid-Base Excess: 7 mmol/L — ABNORMAL HIGH (ref 0.0–2.0)
Acid-Base Excess: 9 mmol/L — ABNORMAL HIGH (ref 0.0–2.0)
Acid-Base Excess: 9 mmol/L — ABNORMAL HIGH (ref 0.0–2.0)
Bicarbonate: 33.3 mmol/L — ABNORMAL HIGH (ref 20.0–28.0)
Bicarbonate: 34.2 mmol/L — ABNORMAL HIGH (ref 20.0–28.0)
Bicarbonate: 33.3 mmol/L — ABNORMAL HIGH (ref 20.0–28.0)
Bicarbonate: 35.4 mmol/L — ABNORMAL HIGH (ref 20.0–28.0)
Bicarbonate: 35.6 mmol/L — ABNORMAL HIGH (ref 20.0–28.0)
Calcium, Ion: 1.37 mmol/L (ref 1.15–1.40)
Calcium, Ion: 1.36 mmol/L (ref 1.15–1.40)
Calcium, Ion: 1.31 mmol/L (ref 1.15–1.40)
Calcium, Ion: 1.29 mmol/L (ref 1.15–1.40)
Calcium, Ion: 1.31 mmol/L (ref 1.15–1.40)
HCT: 21 % — ABNORMAL LOW (ref 39.0–52.0)
HCT: 22 % — ABNORMAL LOW (ref 39.0–52.0)
HCT: 20 % — ABNORMAL LOW (ref 39.0–52.0)
HCT: 17 % — ABNORMAL LOW (ref 39.0–52.0)
HCT: 22 % — ABNORMAL LOW (ref 39.0–52.0)
Hemoglobin: 7.1 g/dL — ABNORMAL LOW (ref 13.0–17.0)
Hemoglobin: 7.5 g/dL — ABNORMAL LOW (ref 13.0–17.0)
Hemoglobin: 6.8 g/dL — CL (ref 13.0–17.0)
Hemoglobin: 5.8 g/dL — CL (ref 13.0–17.0)
Hemoglobin: 7.5 g/dL — ABNORMAL LOW (ref 13.0–17.0)
O2 Saturation: 94 %
O2 Saturation: 98 %
O2 Saturation: 95 %
O2 Saturation: 93 %
O2 Saturation: 92 %
Patient temperature: 98.1
Patient temperature: 37
Patient temperature: 37.1
Patient temperature: 37.3
Patient temperature: 37
Potassium: 3.9 mmol/L (ref 3.5–5.1)
Potassium: 4.3 mmol/L (ref 3.5–5.1)
Potassium: 4 mmol/L (ref 3.5–5.1)
Potassium: 4.2 mmol/L (ref 3.5–5.1)
Potassium: 4 mmol/L (ref 3.5–5.1)
Sodium: 159 mmol/L — ABNORMAL HIGH (ref 135–145)
Sodium: 160 mmol/L — ABNORMAL HIGH (ref 135–145)
Sodium: 159 mmol/L — ABNORMAL HIGH (ref 135–145)
Sodium: 144 mmol/L (ref 135–145)
Sodium: 159 mmol/L — ABNORMAL HIGH (ref 135–145)
TCO2: 35 mmol/L — ABNORMAL HIGH (ref 22–32)
TCO2: 36 mmol/L — ABNORMAL HIGH (ref 22–32)
TCO2: 35 mmol/L — ABNORMAL HIGH (ref 22–32)
TCO2: 37 mmol/L — ABNORMAL HIGH (ref 22–32)
TCO2: 37 mmol/L — ABNORMAL HIGH (ref 22–32)
pCO2 arterial: 64.2 mmHg — ABNORMAL HIGH (ref 32.0–48.0)
pCO2 arterial: 65.9 mmHg (ref 32.0–48.0)
pCO2 arterial: 60.7 mmHg — ABNORMAL HIGH (ref 32.0–48.0)
pCO2 arterial: 65.7 mmHg (ref 32.0–48.0)
pCO2 arterial: 60.3 mmHg — ABNORMAL HIGH (ref 32.0–48.0)
pH, Arterial: 7.321 — ABNORMAL LOW (ref 7.350–7.450)
pH, Arterial: 7.323 — ABNORMAL LOW (ref 7.350–7.450)
pH, Arterial: 7.348 — ABNORMAL LOW (ref 7.350–7.450)
pH, Arterial: 7.341 — ABNORMAL LOW (ref 7.350–7.450)
pH, Arterial: 7.379 (ref 7.350–7.450)
pO2, Arterial: 77 mmHg — ABNORMAL LOW (ref 83.0–108.0)
pO2, Arterial: 119 mmHg — ABNORMAL HIGH (ref 83.0–108.0)
pO2, Arterial: 81 mmHg — ABNORMAL LOW (ref 83.0–108.0)
pO2, Arterial: 75 mmHg — ABNORMAL LOW (ref 83.0–108.0)
pO2, Arterial: 67 mmHg — ABNORMAL LOW (ref 83.0–108.0)

## 2020-10-11 LAB — CBC
HCT: 25.4 % — ABNORMAL LOW (ref 39.0–52.0)
HCT: 26 % — ABNORMAL LOW (ref 39.0–52.0)
HCT: 26 % — ABNORMAL LOW (ref 39.0–52.0)
Hemoglobin: 7.8 g/dL — ABNORMAL LOW (ref 13.0–17.0)
Hemoglobin: 7.7 g/dL — ABNORMAL LOW (ref 13.0–17.0)
Hemoglobin: 7.9 g/dL — ABNORMAL LOW (ref 13.0–17.0)
MCH: 30.8 pg (ref 26.0–34.0)
MCH: 30.1 pg (ref 26.0–34.0)
MCH: 30.6 pg (ref 26.0–34.0)
MCHC: 30.7 g/dL (ref 30.0–36.0)
MCHC: 29.6 g/dL — ABNORMAL LOW (ref 30.0–36.0)
MCHC: 30.4 g/dL (ref 30.0–36.0)
MCV: 100.4 fL — ABNORMAL HIGH (ref 80.0–100.0)
MCV: 101.6 fL — ABNORMAL HIGH (ref 80.0–100.0)
MCV: 100.8 fL — ABNORMAL HIGH (ref 80.0–100.0)
Platelets: 144 10*3/uL — ABNORMAL LOW (ref 150–400)
Platelets: 134 10*3/uL — ABNORMAL LOW (ref 150–400)
Platelets: 154 10*3/uL (ref 150–400)
RBC: 2.53 MIL/uL — ABNORMAL LOW (ref 4.22–5.81)
RBC: 2.56 MIL/uL — ABNORMAL LOW (ref 4.22–5.81)
RBC: 2.58 MIL/uL — ABNORMAL LOW (ref 4.22–5.81)
RDW: 18.7 % — ABNORMAL HIGH (ref 11.5–15.5)
RDW: 18.5 % — ABNORMAL HIGH (ref 11.5–15.5)
RDW: 18.6 % — ABNORMAL HIGH (ref 11.5–15.5)
WBC: 9.4 10*3/uL (ref 4.0–10.5)
WBC: 8.7 10*3/uL (ref 4.0–10.5)
WBC: 10.8 10*3/uL — ABNORMAL HIGH (ref 4.0–10.5)
nRBC: 0.5 % — ABNORMAL HIGH (ref 0.0–0.2)
nRBC: 0.5 % — ABNORMAL HIGH (ref 0.0–0.2)
nRBC: 0.6 % — ABNORMAL HIGH (ref 0.0–0.2)

## 2020-10-11 LAB — BASIC METABOLIC PANEL
Anion gap: 10 (ref 5–15)
BUN: 86 mg/dL — ABNORMAL HIGH (ref 6–20)
CO2: 29 mmol/L (ref 22–32)
Calcium: 8.8 mg/dL — ABNORMAL LOW (ref 8.9–10.3)
Chloride: 117 mmol/L — ABNORMAL HIGH (ref 98–111)
Creatinine, Ser: 1.12 mg/dL (ref 0.61–1.24)
GFR, Estimated: >60 mL/min (ref >60)
Glucose, Bld: 177 mg/dL — ABNORMAL HIGH (ref 70–99)
Potassium: 4.3 mmol/L (ref 3.5–5.1)
Sodium: 156 mmol/L — ABNORMAL HIGH (ref 135–145)

## 2020-10-11 LAB — BPAM RBC
Blood Product Expiration Date: 202202112359
Blood Product Expiration Date: 202202112359
Blood Product Expiration Date: 202202112359
Blood Product Expiration Date: 202202112359
ISSUE DATE / TIME: 202201091812
ISSUE DATE / TIME: 202201111750
Unit Type and Rh: 5100
Unit Type and Rh: 5100
Unit Type and Rh: 5100
Unit Type and Rh: 5100

## 2020-10-11 LAB — GLUCOSE, CAPILLARY
Glucose-Capillary: 116 mg/dL — ABNORMAL HIGH (ref 70–99)
Glucose-Capillary: 125 mg/dL — ABNORMAL HIGH (ref 70–99)
Glucose-Capillary: 141 mg/dL — ABNORMAL HIGH (ref 70–99)
Glucose-Capillary: 154 mg/dL — ABNORMAL HIGH (ref 70–99)
Glucose-Capillary: 156 mg/dL — ABNORMAL HIGH (ref 70–99)
Glucose-Capillary: 166 mg/dL — ABNORMAL HIGH (ref 70–99)

## 2020-10-11 LAB — RENAL FUNCTION PANEL
Albumin: 2.3 g/dL — ABNORMAL LOW (ref 3.5–5.0)
Albumin: 2.4 g/dL — ABNORMAL LOW (ref 3.5–5.0)
Anion gap: 8 (ref 5–15)
Anion gap: 10 (ref 5–15)
BUN: 88 mg/dL — ABNORMAL HIGH (ref 6–20)
BUN: 85 mg/dL — ABNORMAL HIGH (ref 6–20)
CO2: 31 mmol/L (ref 22–32)
CO2: 30 mmol/L (ref 22–32)
Calcium: 8.8 mg/dL — ABNORMAL LOW (ref 8.9–10.3)
Calcium: 8.9 mg/dL (ref 8.9–10.3)
Chloride: 117 mmol/L — ABNORMAL HIGH (ref 98–111)
Chloride: 118 mmol/L — ABNORMAL HIGH (ref 98–111)
Creatinine, Ser: 1.13 mg/dL (ref 0.61–1.24)
Creatinine, Ser: 1.08 mg/dL (ref 0.61–1.24)
GFR, Estimated: >60 mL/min (ref >60)
GFR, Estimated: >60 mL/min (ref >60)
Glucose, Bld: 164 mg/dL — ABNORMAL HIGH (ref 70–99)
Glucose, Bld: 131 mg/dL — ABNORMAL HIGH (ref 70–99)
Phosphorus: 3.9 mg/dL (ref 2.5–4.6)
Phosphorus: 4 mg/dL (ref 2.5–4.6)
Potassium: 3.9 mmol/L (ref 3.5–5.1)
Potassium: 4.2 mmol/L (ref 3.5–5.1)
Sodium: 156 mmol/L — ABNORMAL HIGH (ref 135–145)
Sodium: 158 mmol/L — ABNORMAL HIGH (ref 135–145)

## 2020-10-11 LAB — LACTATE DEHYDROGENASE: LDH: 556 U/L — ABNORMAL HIGH (ref 98–192)

## 2020-10-11 LAB — FIBRINOGEN: Fibrinogen: 367 mg/dL (ref 210–475)

## 2020-10-11 LAB — MAGNESIUM: Magnesium: 2.6 mg/dL — ABNORMAL HIGH (ref 1.7–2.4)

## 2020-10-11 LAB — APTT
aPTT: 53 seconds — ABNORMAL HIGH (ref 24–36)
aPTT: 60 seconds — ABNORMAL HIGH (ref 24–36)

## 2020-10-11 LAB — LACTIC ACID, PLASMA
Lactic Acid, Venous: 0.9 mmol/L (ref 0.5–1.9)
Lactic Acid, Venous: 0.6 mmol/L (ref 0.5–1.9)

## 2020-10-11 MED ORDER — POTASSIUM CHLORIDE 20 MEQ PO PACK
40.0000 meq | PACK | Freq: Once | ORAL | Status: AC
  Administered 2020-10-11: 40 meq
  Filled 2020-10-11: qty 2

## 2020-10-11 MED ORDER — PIVOT 1.5 CAL PO LIQD
1000.0000 mL | ORAL | Status: DC
  Administered 2020-10-12: 1000 mL

## 2020-10-11 MED ORDER — LORAZEPAM 2 MG/ML IJ SOLN
1.0000 mg | INTRAMUSCULAR | Status: DC | PRN
  Administered 2020-10-11 – 2020-10-12 (×4): 1 mg via INTRAVENOUS
  Filled 2020-10-11 (×4): qty 1

## 2020-10-11 MED ORDER — OXYCODONE HCL 5 MG/5ML PO SOLN
7.5000 mg | ORAL | Status: DC
  Administered 2020-10-11 – 2020-10-12 (×6): 7.5 mg
  Filled 2020-10-11 (×6): qty 10

## 2020-10-11 MED ORDER — DEXTROSE 5 % IV SOLN: INTRAVENOUS | Status: AC

## 2020-10-11 MED ORDER — FUROSEMIDE 10 MG/ML IJ SOLN
40.0000 mg | Freq: Two times a day (BID) | INTRAMUSCULAR | Status: DC
  Administered 2020-10-11 – 2020-10-12 (×3): 40 mg via INTRAVENOUS
  Filled 2020-10-11 (×3): qty 4

## 2020-10-11 NOTE — Progress Notes (Signed)
Patient ID: Donald Rowe, male   DOB: 1961-03-25, 60 y.o.   MRN: [id number]    Advanced Heart Failure Rounding Note   Subjective:    12/8 Cannulated for VV ECMO - 32 FR  RIJ [place] 12/9 Extubated/Reintubated for altered mental status 12/13 Circuit change for elevated LDH 12/15 PLEX started -> completed 12/22 12/16 Tracheostomy 12/23 head CT (normal) for change in mental status.  12/26 Pan CT for drop in hgb. Small SAH. Severe lung disease 12/27 CVVH begun 1/1 CVVH stopped 1/3 Foley placed for urinary retention 1/5  CT head and chest - resolution of SAH. Severe bilateral airspace disease, slightly improved. Pneumomediastinum resolved.   Got 1u RBCs yesterday. Remains agitated. On precedex. BP labile with agitation and after meds. Not alert enough enough to work with PT.   Awake this am following occasional commands. Very weak   TFs stopped for possible PEG tube yesterday and NA went up to 160. FW and TFs now restarted. NA 156  CXR this am slightly worse this am Personally reviewed   ECMO   Speed 4025 Flow 5.25 L Sweep 9 -> 7 dP 29 pVen -93  ABG: 7.32/64/77/94% Hgb 7.8 LDH 434 -> 488 -> 514 -> 508 -> 542 -> 541 -> 556 Lactic acid 0.9 PTT 53  Objective:   Weight Range:  Vital Signs:   Temp:  [98 F (36.7 C)-98.6 F (37 C)] 98.1 F (36.7 C) (01/12 0400) Pulse Rate:  [98-137] 115 (01/12 0630) Resp:  [15-51] 22 (01/12 0630) BP: (104-182)/(44-70) 163/64 (01/12 0555) SpO2:  [87 %-98 %] 98 % (01/12 0630) Arterial Line BP: (103-206)/(34-72) 159/61 (01/12 0630) FiO2 (%):  [40 %-50 %] 50 % (01/12 0400) Weight:  [101.2 kg] 101.2 kg (01/12 0500) Last BM Date: 10/10/20  Weight change: Filed Weights   10/09/20 0600 10/10/20 0500 10/11/20 0500  Weight: 98 kg 100.6 kg 101.2 kg    Intake/Output:   Intake/Output Summary (Last 24 hours) at 10/11/2020 0745 Last data filed at 10/11/2020 0600 Gross per 24 hour  Intake 4053.77 ml  Output 3880 ml  Net 173.77 ml      Physical Exam: General:  Awake on vent. Following occasional  commands HEENT: normal Neck: supple. Ecmo cannula RIJ + trach Carotids 2+ bilat; no bruits. No lymphadenopathy or thryomegaly appreciated. Cor: PMI nondisplaced. Regular tachy  Lungs: coarse Abdomen: soft, nontender, nondistended. No hepatosplenomegaly. No bruits or masses. Good bowel sounds. Extremities: no cyanosis, clubbing, rash, edema + boots Neuro: as above no focal deficits   Telemetry: sinus 110-120s Personally reviewed   Labs: Basic Metabolic Panel: Recent Labs  Lab 10/07/20 0314 10/07/20 0516 10/08/20 0438 10/08/20 0519 10/09/20 0414 10/09/20 1135 10/09/20 1542 10/09/20 1843 10/10/20 0413 10/10/20 0414 10/10/20 1618 10/10/20 1724 10/10/20 1827 10/10/20 1957 10/11/20 0400 10/11/20 0413  NA 153*   < > 151*   < > 153*   < > 151*   < > 155*   < > 156* 158* 160* 160* 159* 156*  K 4.3   < > 3.6   < > 4.0   < > 3.9   < > 3.9   < > 4.3 4.5 4.3 4.0 3.9 3.9  CL 115*   < > 114*   < > 116*  --  111  --  116*  --  116*  --   --   --   --  117*  CO2 29   < > 28   < > 30  --  31  --  29  --  31  --   --   --   --  31  GLUCOSE 137*   < > 162*   < > 161*  --  126*  --  109*  --  131*  --   --   --   --  164*  BUN 73*   < > 83*   < > 89*  --  90*  --  92*  --  92*  --   --   --   --  88*  CREATININE 1.08   < > 1.09   < > 1.16  --  1.12  --  1.14  --  1.14  --   --   --   --  1.13  CALCIUM 9.3   < > 8.8*   < > 8.9  --  9.0  --  9.2  --  9.0  --   --   --   --  8.8*  MG 2.8*  --  2.7*  --  2.8*  --   --   --  2.8*  --   --   --   --   --   --  2.6*  PHOS 2.6   < > 2.7   < > 3.5  --  3.6  --  3.8  --  3.6  --   --   --   --  3.9   < > = values in this interval not displayed.    Liver Function Tests: Recent Labs  Lab 10/09/20 0414 10/09/20 1542 10/10/20 0413 10/10/20 1618 10/11/20 0413  AST  --   --  41  --   --   ALT  --   --  42  --   --   ALKPHOS  --   --  201*  --   --   BILITOT  --   --  0.9  --    --   PROT  --   --  6.2*  --   --   ALBUMIN 2.4* 2.5* 2.4*  2.4* 2.4* 2.3*   No results for input(s): LIPASE, AMYLASE in the last 168 hours. No results for input(s): AMMONIA in the last 168 hours.  CBC: Recent Labs  Lab 10/09/20 1542 10/09/20 1843 10/10/20 0413 10/10/20 0414 10/10/20 1618 10/10/20 1724 10/10/20 1827 10/10/20 1957 10/10/20 2103 10/11/20 0400 10/11/20 0413  WBC 11.1*  --  10.6*  --  8.3  --   --   --  10.2  --  9.4  HGB 8.6*   < > 7.8*   < > 7.5*   < > 7.5* 6.8* 8.0* 7.1* 7.8*  HCT 26.4*   < > 26.1*   < > 24.6*   < > 22.0* 20.0* 26.7* 21.0* 25.4*  MCV 99.6  --  102.4*  --  101.7*  --   --   --  100.0  --  100.4*  PLT 196  --  174  --  213  --   --   --  161  --  144*   < > = values in this interval not displayed.    Cardiac Enzymes: No results for input(s): CKTOTAL, CKMB, CKMBINDEX, TROPONINI in the last 168 hours.  BNP: BNP (last 3 results) No results for input(s): BNP in the last 8760 hours.  ProBNP (last 3 results) No results for input(s): PROBNP in the last 8760 hours.    Other results:  Imaging: DG CHEST PORT 1 VIEW  Result Date: 10/11/2020 CLINICAL DATA:  60 year old male COVID-63.  ECMO. EXAM: PORTABLE CHEST 1 VIEW COMPARISON:  Portable chest 10/10/2020 and earlier. FINDINGS: Portable AP semi upright view at 0540 hours. Mildly rotated to the left today. Stable tracheostomy tube. Stable left PICC line, visible enteric feeding tube, right chest ECMO cannula. Stable low lung volumes. No pneumothorax or definite pleural effusion. Widespread bilateral coarse and confluent pulmonary opacity with perihilar air bronchograms. Ventilation has mildly improved since 10/09/2020, not significantly different from yesterday. Paucity of bowel gas in the upper abdomen. Stable visualized osseous structures. IMPRESSION: 1.  Stable lines and tubes. 2. Widespread bilateral pneumonia/ARDS with mildly improved ventilation in both lungs since 10/09/2020. Electronically  Signed   By: Genevie Ann M.D.   On: 10/11/2020 06:55   DG CHEST PORT 1 VIEW  Result Date: 10/10/2020 CLINICAL DATA:  ECMO.  COVID. EXAM: PORTABLE CHEST 1 VIEW COMPARISON:  10/09/2020. FINDINGS: Left PICC line noted with tip over right atrium. Tracheostomy tube, feeding tube, in stable position. ECMO device in stable position. Heart size stable. Low lung volumes. Diffuse severe bilateral pulmonary infiltrates/edema. No pleural effusion or pneumothorax. IMPRESSION: 1. Left PICC line noted with tip over right atrium. Tracheostomy tube, feeding tube, ECMO device in stable position. 2. Low lung volumes. Diffuse severe bilateral pulmonary infiltrates again noted. Electronically Signed   By: Marcello Moores  Register   On: 10/10/2020 06:24     Medications:     Scheduled Medications: . albuterol  2.5 mg Nebulization Q4H  . amLODipine  10 mg Per Tube q1800  . chlorhexidine gluconate (MEDLINE KIT)  15 mL Mouth Rinse BID  . Chlorhexidine Gluconate Cloth  6 each Topical Daily  . clonazePAM  0.5 mg Per Tube Q8H  . cloNIDine  0.1 mg Per Tube TID  . docusate  100 mg Per Tube BID  . feeding supplement (PROSource TF)  45 mL Per Tube QID  . fiber  1 packet Per Tube BID  . free water  200 mL Per Tube Q2H  . furosemide  40 mg Intravenous Daily  . insulin aspart  2-6 Units Subcutaneous Q4H  . levothyroxine  50 mcg Per Tube Q0600  . lidocaine  1 patch Transdermal Q24H  . mouth rinse  15 mL Mouth Rinse 10 times per day  . metoCLOPramide (REGLAN) injection  5 mg Intravenous Q12H  . metoprolol tartrate  50 mg Per Tube Q12H  . nystatin  5 mL Per Tube QID  . oxyCODONE  10 mg Per Tube Q4H  . pantoprazole sodium  40 mg Per Tube BID  . polyethylene glycol  17 g Per Tube BID  . pravastatin  40 mg Per Tube q1800  . QUEtiapine  150 mg Per Tube TID  . sodium chloride flush  10-40 mL Intracatheter Q12H  . sodium chloride HYPERTONIC  4 mL Nebulization Q4H    Infusions: . sodium chloride Stopped (10/10/20 1804)  . albumin  human 12.5 g (09/29/20 1644)  . bivalirudin (ANGIOMAX) infusion 0.5 mg/mL (Non-ACS indications) 0.06 mg/kg/hr (10/11/20 0600)  . dexmedetomidine (PRECEDEX) IV infusion 0.8 mcg/kg/hr (10/11/20 0600)  . feeding supplement (PIVOT 1.5 CAL) 1,000 mL (10/10/20 1732)  . fentaNYL infusion INTRAVENOUS 0 mcg/hr (10/07/20 0913)  . vancomycin Stopped (10/11/20 0031)    PRN Medications: acetaminophen (TYLENOL) oral liquid 160 mg/5 mL, albumin human, albuterol, diphenhydrAMINE, fentaNYL, Gerhardt's butt cream, guaiFENesin-dextromethorphan, haloperidol lactate, labetalol, lip balm, LORazepam, midazolam, ondansetron **OR** ondansetron (ZOFRAN) IV, sodium chloride flush  Assessment/Plan:   1. Acute hypoxic respiratory failure/ARDS in setting of COVID-19 PNA - Cannulated for VV ECMO on 12/8 after failing intubation x 2 days - has complete remedisivir, baricitinib, solumedrol.  - s/p trach 12/16 - CT chest 12/26 with severe lung disease and pneumomediastinum.  - 1/5  CT head and chest - resolution of SAH. Severe bilateral airspace disease, slightly improved. Pneumomediastinum resolved.  - Restarted vanc/cefepime 1/6 with Staph in trach aspirate. Getting chest PT Still with significant secretions - Circuit changed 12/13 for elevated LDH and lower flows. PLEX started 12/15. S/p PLEX #4/4 on 12/21. LDH rising slightly. Circuit parameters stable.  Continue to follow  - Sweep down to 7 but pCO2 rising and more tachypneic - CVVH started 12/27 with intractable volume overload, stopped 1/1. Creatinine stable but BUN high.Wieght increasing. Will increase alsix to 40 IV bid today, Check BMET at 2p,  - Soduim elevated FW restarted. Check BMET at 2pm. Can give some D5 as needed - Bivalirudin with PTT goal back up to 50-70. PTT 53 today Discussed dosing with PharmD personally. - Mental status improving very slowly. Now off propofol with purposeful movement.  But remains markedly weak and tachypneic with little  improvement in respiratory status. We are now 5 weeks into his ECMO run. I remain very concerned that he may not have the respiratory reserve to be liberated from ECMO any time soon.  - We are trying to wean sweep today but does not appear to be tolerating well. Follow ABGs.   I discussed the situation with his wife and daughter yesterday. We discussed the fact that he is not close to being able to come off ECMO and given his agitation he has not been able to participate in PT and has become extremely weak.  I quoted them a 5-10% chance of him being able to come off ECMO in next 2 weeks (barring any other complications) and even if he was able to come off ECMO would likely require months in a SNF with ventilator support. They were very realistic and asked extremely good questions. They clearly want to give him a chance to recover but also they understand that he may not want to go through everything he is going through and a possible retracted recovery. We have changed his Code Status to limited code and we will continue discussions regarding how much further we want to go with this high-level of support.  2. Bilateral DVT - remains on bivalirudin - Dosing d/w PharmD, increased PTT goal back to 50-70.   3. DM2 - Insulin adjusted. CBGs improved   4. Obesity - Body mass index is 32.95 kg/m.  5. F/E/N - Ileus improved. Back on TFs - We had tentative plan for feeding tube with IR on 1/11 but deferredto give family more time to discuss next steps.   6. HTN - Remains labile. Continue to adjust  7. Rectal bleeding - follow closely. Rectal tube back in over the weekend for perfuse liquid stools. No bleeding currently  8. AKI - Now off CVVH, creatinine so far stable but BUN rising. Plan as above - more lasix today 9. Acute encephalopathy - remains agitated  - head CT ok 09/07/20 and 12/21 - head CT 12/26. Small SAH.  Repeat CT on 12/27 with unchanged small SAH.   10. Small SAH - on CT  12/26 - repeat CT 12/27 unchanged small SAH.  - PTT goal 50-70. 53 today - avoid severe HTN as best as possible  11.  Gastric ileus - Resolved  12. Anemia - Transfuse hgb < 7.5.  - Has been getting periodic blood without overt GI bleeding.   13. Severe deconditioning/muscle weakness - PT has been limited due to agitation - Continue to try to mobilize as best as posible - No change - PT/OT to see again today   Plan discussed on multi-discplinary ECMO rounds with CCM, ECMO coordinator/specialist, PharmDs and RNs.  CRITICAL CARE Performed by: Glori Bickers  Total critical care time: 40 minutes  Critical care time was exclusive of separately billable procedures and treating other patients.  Critical care was necessary to treat or prevent imminent or life-threatening deterioration.  Critical care was time spent personally by me (independent of midlevel providers or residents) on the following activities: development of treatment plan with patient and/or surrogate as well as nursing, discussions with consultants, evaluation of patient's response to treatment, examination of patient, obtaining history from patient or surrogate, ordering and performing treatments and interventions, ordering and review of laboratory studies, ordering and review of radiographic studies, pulse oximetry and re-evaluation of patient's condition.    Length of Stay: [place]ona [place] MD 10/11/2020, 7:45 AM  Advanced Heart Failure Team Pager [phone] (M-F; [place])  Please contact [facility] for night-coverage after hours (4p -7a ) and weekends on [email or web address]

## 2020-10-11 NOTE — Progress Notes (Signed)
Updated wife via phone.   We have spoken about pt's stalled progression and his delirium/agitation and need for sedation is certainly contributing. We spoke about the lengthy time on ecmo and further debilitation if we are unable to get him awake enough to participate in therapies.   She and his daughter would like a few more weeks time for now to monitor for progression. They have requested slow incremental adjustments to his sedation. I did advise them that some is necessary but that we have minimized it greatly.   I discontinued prn sedation that, upon review, had not been utilized anyway and we will work to decrease other agents.  I have decreased scheduled oxy to 7.5 from 10mg  q4 and we will monitor response.   She did address concerns about the seroquel and I advised her that due to his delirium/agitation and likely, should he survive, ptsd from this course that these agents are favored and perhaps that should be one of the later agents to adjust. It is possible that he may be less sedated with another antipsychotic though so that if the above changes and furhter wean from continuous do not improve his sleep/wake may warrant changing to other agent risperdal/depakote sprinkles etc.

## 2020-10-11 NOTE — Procedures (Signed)
Extracorporeal support note     ECLS cannulation: 09/18/2020 Last circuit change: 09/11/2020  Indication: COVID ARDS   Configuration: VV, RIJ 32Fr [place]   Pump speed: 4025 Pump flow: 5.2 L Pump used: Cardiohelp   Sweep gas: 100%, 7 LPM   Circuit check: minimal clot Anticoagulant: bivalirudin   Changes in support: family is in discussion re: length of continued ecmo support. Was able to wean sweep ever so slightly over past 24 hours. cxr stable, will increase diuresis today. Sodium up with fwf and tf holding yesterday. Will recheck bmp this afternoon and if sodium still elevated will start d5w as well, esp after diuresis. Peg tube held will do if cont to improve  anticipated goals/duration of support: Bridge to recovery, day 35 of support.  Briant Sites, DO 10/11/20 11:09 AM [facility]

## 2020-10-11 NOTE — Progress Notes (Addendum)
Physical Therapy Treatment Patient Details Name: Donald Rowe MRN: [id number] DOB: 1961/01/18 Today's Date: 10/11/2020    History of Present Illness 60 y.o. male with a pertinent history of hypothyroidism on levothyroxine and hyperlipidemia who was diagnosed with Covid 11/27 and is not vaccinated.  With EMS SaO2 on RA 78%O2. ED patient was increased to 6 L oxygen satting about 92%,. CTA of the Chest was assessed and negative for pulmonary embolism but showed diffuse bilateral infiltrates. He was treated with IV remdesivir, steroids and baricitinib.  Initially, he was also treated with antibiotics but these were stopped on 12/6 with a low procalcitonin. 12/07 intubated, central line, A line, 12/08 ECMO cannulation, cortrak, attempted extubation 12/9 failed reintubated. Trach placed 12/16. CCRT started 12/26. 09/30/20 CRRT stopped 1/5 CT shows resolution of SAH, severe bilateral airspace disease    PT Comments    Pt admitted with above diagnosis. Pt able to sit EOB x 5 min with total assist to max assist at times needing constant support of 3 persons due to restlessness and lines.  Pt was following commands better today with continued somewhat delayed response to commands.  OVerall feel that pt is tolerating mobility well and is moving bil LEs more each visit.  Vent settings 50% FiO2 with PEEP 10 and VSS.  Pt met 0/5 goals due to fluctuation in pts ability to participate.  Goals revised today. Will follow acutely.  Pt currently with functional limitations due to balance and endurance deficits. Pt will benefit from skilled PT to increase their independence and safety with mobility to allow discharge to the venue listed below.     Follow Up Recommendations  CIR     Equipment Recommendations   (TBD)    Recommendations for Other Services       Precautions / Restrictions Precautions Precautions: Other (comment) Precaution Comments: ECMO, cortrak, vent via trach, cath, flexi seal Restrictions Other  Position/Activity Restrictions: safety mittens    Mobility  Bed Mobility Overal bed mobility: Needs Assistance Bed Mobility: Supine to Sit;Sit to Supine     Supine to sit: Total assist;+2 for physical assistance;+2 for safety/equipment Sit to supine: Total assist;+2 for physical assistance;+2 for safety/equipment   General bed mobility comments: patient able to sit EOB this date with PT, tech, ECMO specialist, and RN  Transfers                 General transfer comment: not attemtped  Ambulation/Gait                 Stairs             Wheelchair Mobility    Modified Rankin (Stroke Patients Only)       Balance Overall balance assessment: Needs assistance Sitting-balance support: No upper extremity supported;Feet supported Sitting balance-Leahy Scale: Zero Sitting balance - Comments: pt with incr response to command to right himself, needed mod Ax2 to maintain sitting balance due to restless at EOB.  Difficulty attaining upright sitting even with assist.  Pt kicking bil LEs more and stuck tongue out to command.  Shook head immediately when asked if he was tired at end of treatment.  Also turned head to voice every time PT spoke. Postural control: Posterior lean;Right lateral lean;Left lateral lean                                  Cognition Arousal/Alertness: Awake/alert Behavior During Therapy: Restless;Impulsive Overall Cognitive Status:  Difficult to assess                                 General Comments: was following 90% of commands - very delayed response to commands      Exercises General Exercises - Lower Extremity Ankle Circles/Pumps: PROM;Both;10 reps;Supine Long Arc Quad: AROM;Seated;Both;10 reps;AAROM Other Exercises Other Exercises: responds to direction of PT voice with spontaneous cervical rotation,  focuses on PT    General Comments General comments (skin integrity, edema, etc.): continues to have  spontaneous movement in all 4 extremities R>L, movement to command in bil LEs, increased command follow today, restless with movement, pitching forward and to the right and L throughout sitting EOB.      Pertinent Vitals/Pain Pain Assessment: Faces Faces Pain Scale: Hurts whole lot Pain Location: generalized with limb movement Pain Descriptors / Indicators: Grimacing Pain Intervention(s): Limited activity within patient's tolerance;Monitored during session;Repositioned;Premedicated before session    Home Living                      Prior Function            PT Goals (current goals can now be found in the care plan section) Acute Rehab PT Goals Patient Stated Goal: unable PT Goal Formulation: Patient unable to participate in goal setting Time For Goal Achievement: 10/25/20 Potential to Achieve Goals: Poor Progress towards PT goals: Progressing toward goals    Frequency    Min 3X/week      PT Plan Current plan remains appropriate    Co-evaluation              AM-PAC PT "6 Clicks" Mobility   Outcome Measure  Help needed turning from your back to your side while in a flat bed without using bedrails?: Total Help needed moving from lying on your back to sitting on the side of a flat bed without using bedrails?: Total Help needed moving to and from a bed to a chair (including a wheelchair)?: Total Help needed standing up from a chair using your arms (e.g., wheelchair or bedside chair)?: Total Help needed to walk in hospital room?: Total Help needed climbing 3-5 steps with a railing? : Total 6 Click Score: 6    End of Session Equipment Utilized During Treatment:  (Kreg bed) Activity Tolerance: Patient limited by fatigue Patient left: with nursing/sitter in room;in bed;with call bell/phone within reach;with restraints reapplied Nurse Communication: Mobility status PT Visit Diagnosis: Muscle weakness (generalized) (M62.81);Difficulty in walking, not elsewhere  classified (R26.2)     Time: 0071-2197 PT Time Calculation (min) (ACUTE ONLY): 17 min  Charges:  $Therapeutic Activity: 8-22 mins                     Demetra Moya W,PT Acute Rehabilitation Services Pager:  [phone]  Office:  [place] 10/11/2020, 1:16 PM

## 2020-10-11 NOTE — Plan of Care (Signed)
  Problem: Fluid Volume: Goal: Hemodynamic stability will improve Outcome: Progressing   Problem: Respiratory: Goal: Ability to maintain adequate ventilation will improve Outcome: Progressing   Problem: Clinical Measurements: Goal: Ability to maintain clinical measurements within normal limits will improve Outcome: Progressing Goal: Cardiovascular complication will be avoided Outcome: Progressing   Problem: Nutrition: Goal: Adequate nutrition will be maintained Outcome: Progressing   Problem: Elimination: Goal: Will not experience complications related to bowel motility Outcome: Progressing   Problem: Pain Managment: Goal: General experience of comfort will improve Outcome: Progressing

## 2020-10-11 NOTE — Progress Notes (Addendum)
ANTICOAGULATION CONSULT NOTE  Pharmacy Consult for bivalirudin Indication: ECMO + bilateral DVTs + [facility] 12/26   Recent Labs    10/10/20 0413 10/10/20 0414 10/10/20 1618 10/10/20 1724 10/10/20 2103 10/11/20 0400 10/11/20 0413  HGB 7.8*   < > 7.5*   < > 8.0* 7.1* 7.8*  HCT 26.1*   < > 24.6*   < > 26.7* 21.0* 25.4*  PLT 174  --  213  --  161  --  144*  APTT 52*  --  58*  --   --   --  53*  CREATININE 1.14  --  1.14  --   --   --  1.13   < > = values in this interval not displayed.    Estimated Creatinine Clearance: 82.5 mL/min (by C-G formula based on SCr of 1.13 mg/dL).   Assessment: 85 yom unvaccinated presenting with severe COVID ARDs, intubated on 12/6, continued to have ventilation issues despite NMB and proning - started on VV ECMO on 12/8. Pt noted to have bilateral DVTs 12/8. Bivalirudin started for ECMO anticoagulation. Head CT 12/26 with new small SAH, repeat 12/27 stable without expansion. Repeat heat CT 1/5 SAH improved.   APTT therapeutic at 53 sec on bivalirudin drip 0.06mg /kg/hr, h/h low with prn PRBC last 1/9 but overall stable, stools somewhat dark but this is stable, pltc 150-200. LDH stable 500s, fibrinogen stable 300s.  Goal of Therapy:  aPTT 50-70 seconds  Monitor platelets by anticoagulation protocol: Yes   Plan:  Continue bivalirudin 0.06 mg/kg/hr Recheck aPTTs every 5a & 5p with ECMO labs.   Fredonia Highland, PharmD, BCPS, [facility] Clinical Pharmacist [phone] Please check AMION for all [facility] numbers 10/11/2020

## 2020-10-11 NOTE — Progress Notes (Signed)
This chaplain is present for bedside prayer with the Pt.

## 2020-10-11 NOTE — Progress Notes (Signed)
   Palliative Medicine Inpatient Follow Up Note  Reason for consult:  "ECMO"  HPI:  Per intake H&P --> Donald Rowe a 59 y.o.malewith a pertinent history ofhypothyroidism on levothyroxine and hyperlipidemia who was diagnosed with Covid 1 week ago and is not vaccinated. With EMS he was seen to be desaturating to 78% on room air. He states he started having symptoms on Friday and tested positive on Saturday.  Had initially elected to be DNAR without intubation though later changed this and got intubated on 12/6  in the setting of worsening respiratory failure with hypoxia. CTA chest w/ diffuse bilateral infiltrates. On 12/8 was cannulated for VV ECMO. Had been extubated on 12/9 then emergently re intubated to clinical instability.     Palliative care was asked to get involved in the setting of serious illness/ ECMO to set goals and expectations   Today's Discussion (10/11/2020):  Chart reviewed.  Case reviewed with team.    Today day 39 his hospital stay.  Intermittently patient is able to follow some simple commands.  Continues with severe agitation intermittently  Patient remains weak    Appears generally uncomfortable and agitated  I spoke with daughter/Donald Rowe at the bedside  Created space and opportunity for Donald Rowe to explore her thoughts and feeling regarding her father's current medical situation.  She understands the seriousness of the current medical situation.   She verbalizes that she is preparing for the what-ifs but remains hopeful.  Donald Rowe shares her love and appreciation for her father.  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  Therapeutic listening and emotional support offered  Education offered on the importance of self-care in the midst of difficult situations.  Education offered on grief and bereavement services through area hospices.  PMT will continue to support holistically.      Questions and concerns addressed    SUMMARY OF  RECOMMENDATIONS Partial Code/Full Scope of Care  ECMO as a bridge to recovery  Spiritual support appreciated  Ongoing PMT support    Time Spent: 20 Greater than 50% of the time was spent in counseling and coordination of care ______________________________________________________________________________________ Lorinda Creed NP [facility] Palliative Medicine Team Team Cell Phone: [phone] Please utilize secure chat with additional questions, if there is no response within 30 minutes please call the above phone number  Palliative Medicine Team providers are available by phone from 7am to 7pm daily and can be reached through the team cell phone.  Should this patient require assistance outside of these hours, please call the patient's attending physician.

## 2020-10-11 NOTE — Progress Notes (Signed)
NAME:  Donald Rowe, MRN:  [id number], DOB:  Jun 12, 1961, LOS: 2 ADMISSION DATE:  09/23/2020, CONSULTATION DATE:  12/6 REFERRING MD:  Dr. Aileen Fass, CHIEF COMPLAINT:  SOB    Brief History   60 y/o M admitted 12/4 with 1 week hx of SOB, known COVID positive.  He is unvaccinated.  CTA chest negative for PE but demonstrated diffuse bilateral infiltrates. [place] started on 12/8   Past Medical History  Hypothyroidism  [facility] Events/Procedures  12/04 Admit  12/06 PCCM consulted  12/07 Intubated, central line, a line 12/08 VV ECMO Cannulaton, cortrak 12/13 circuit change for hemolytic anemia 12/16 Tracheostomy  12/17 bronch w/ BAL 1/5 mrsa Consults:  PCCM, Heart failure, TCTS, ECMO  Significant Diagnostic Tests:   CTA Chest 12/4 >> extensive bilateral airspace disease, no large PE identified, limited study   LE Venous Duplex 12/4 >> negative for DVT bilaterally   LE venous duplex 12/8> BLE DVTs  RUE venous duplex 12/23  RUE arterial duplex 12/23  CT head 12/26 : Minimal SAH on left side  CT abdomen and pelvis 12/26: Extensive bilateral airspace disease with small pleural effusion  CT head 12/27: Subarachnoid unchanged  Chest x-ray 1/1: Significant improvement in airspace disease  Micro Data:  COVID 12/4 >> negative  Influenza A/B 12/4 >> negative  MRSA PCR 12/5 >> negative  BCx2 12/4 >> NG Trach aspirate 12/12> rare MRSA BAL 12/17> MRSA, candida Aspergillus Ag BAL 12/17> 0.05 12/18 sars2: positive 1/5 vanc->after 10 days  Antimicrobials:  Azithromycin 12/5 >> 12/6  Ceftriaxone 12/5 >> 12/6  Cefepime 12/7>12/14 vanc 12/14>12/20 Meropenem 12/17>12/21 Vanc1/5->  Interim history/subjective:  1/12: sweep 7L today. Vent remains stable. Peg held after family meeting, will do if begins to make more improvement. Pt remains profoundly weak.  1/11: remains on 9L sweep. Vent minimal. Family meeting with Dr Haroldine Laws 1/10: increased sweep  overnight. Remains on vanc will set end dated for total of 10 days.   Objective   Blood pressure (!) 171/65, pulse (!) 119, temperature 98.6 F (37 C), temperature source Axillary, resp. rate 20, height _0  (1.753 m), weight 101.2 kg, SpO2 96 %.    Vent Mode: PCV FiO2 (%):  [40 %-50 %] 50 % Set Rate:  [10 bmp] 10 bmp PEEP:  [10 cmH20] 10 cmH20 Plateau Pressure:  [24 cmH20-34 cmH20] 24 cmH20   Intake/Output Summary (Last 24 hours) at 10/11/2020 1112 Last data filed at 10/11/2020 1002 Gross per 24 hour  Intake 4277.7 ml  Output 3855 ml  Net 422.7 ml   Filed Weights   10/09/20 0600 10/10/20 0500 10/11/20 0500  Weight: 98 kg 100.6 kg 101.2 kg    Examination: Gen: critically ill appearing man laying in bed in NAD, appears older than stated age.  HEENT: /AT, eyes anicteric, eomi, perrla but not tracking Neck: trach w/o bleeding CV: Tachycardic, regular rhythm Pulm: Bilateral rales improving, tidal volumes around 250 cc. tachypneic Abd: soft, NT, mildly distended Extm: no cyanosis + edema, compression stockings in place Skin: warm & dry, pallor. No rashes. Neuro: RASS -2, remains not following commands or too weak to.   CXR personally reviewed> persistent bilateral airspace disease,    Resolved Hospital Problem list     Assessment & Plan:   Acute hypoxemic and hypercarbic respiratory failure secondary to COVID PNA with severe ARDS.  S/p VV ECMO cannulation 09/22/2020.  S/p tracheostomy 12/16  Community-acquired pneumonia with MRSA- completed treatment. Recurrent LLL HAP-staph aureus -Completed remdesivir, solumedrol, and  baricitnib. -Con't full ECMO support, unable to consistently wean sweep -Completed treatment with 4 sessions of Plex for hemolysis -cont daily lasix for euvolemia goal -Completed 7 days vanc for MRSA pneumonia in early december.  -resumed vanc for repeated MRSA pneumonia.   Agitated delirium requiring titration of IV sedation.  Trivial left-sided  subarachnoid hemorrhage   -Periods of severe agitation are limiting ability to wean ECMO -cont on precedex, will cont attempts at weaning -Con't enteral agents  -delirium  remains main limitation to rehabilitation at this point, pt profoundly weak with lengthy pump run, concern about ability to rehab to become strong enough to wean from ecmo at this time. Esp without consistent improvement on sweep.  -did decreased sweep but 2L yesterday (on 7L)  -cont titration  Bilateral lower ext DVTs- provoked by COVID infection -Continue bivalirudin; goal PTT 50-70 -needs 3 months AC for provoked DVT   Prediabetes- remains controlled  -a1c 6 -SSI PRN -goal BG 140-180  Hypothyroidism -con't PTA synthroid   Labile hypertension, contributed by periods of agitation.  Hypotension after meds.  -maintain spaced out sedating meds and antihypertensives to avoid hypotension and oversedation -con't clonidine, amlodipine, and metoprolol -agitation control helps moderate BP -on precedex alone for continuous sedation but decreased dosing  Hemolytic anemia - improved. More expected down trending of Hb with expected losses associated with ECMO Throbocytopenia Hyperbilirubinemia Rheumatologic-associated MAHA remains in differential.  hgb and plt are stable -Completed treatment with the Plex for 4 sessions -Transfuse if hgb <7.5. Recheck later.  Vomiting- resolved -con't reglan -resuming tf -holding peg placement at this time   SAH- suspect hypertensive in etiology> resolved on follow up imaging -PTT goals 50-70  Hypernatremia -fwf held in light of planned peg but now that is held will resume -recheck bmp this afternoon and if remains >156 will give some d5w -con't to monitor  Deconditioning -Long road of rehab ahead if able to tolerate. Pt's agitation and delirium are the limiting factors to improving strength. Need for sedation with cannulation but inability to wean circuit for decannuylation 2/2  delirium and overall weakness.  -challenging case, ideally will be able to utilize lungs at this juncture to wean circuit  -Dr Mahalia Longest met with family 1/11 who are considering pt's previously expressed wishes and goals for improvement to baseline. He expressed he will need aggressive rehab and potential nh placement for some time if he survives.  -code status changed to DNR -holding on peg tube in light of this  Best practice (evaluated daily)  Diet: TF and fwf Pain/Anxiety/Delirium protocol (if indicated): PAD protocol, limit IV meds VAP protocol (if indicated): yes DVT prophylaxis: Bivalirudin GI prophylaxis: pantoprazole BID Glucose control: SSI Mobility: As tolerated Last date of multidisciplinary goals of care discussion: 10/10/20 with wife and daughter, RN, ECMO specialist, Dr Haroldine Laws Summary of discussion con't aggressive care for now, family discussions ongoing Follow up goals of care discussion due: 1/17 Code Status: DNR  Disposition: ICU  Critical care time: The patient is critically ill with multiple organ systems failure and requires high complexity decision making for assessment and support, frequent evaluation and titration of therapies, application of advanced monitoring technologies and extensive interpretation of multiple databases.  Critical care time 67mns. This represents my time independent of the NPs time taking care of the pt. This is excluding procedures.    JAudria NineDO [facility] 10/11/2020, 11:12 AM

## 2020-10-11 NOTE — Progress Notes (Signed)
ANTICOAGULATION CONSULT NOTE  Pharmacy Consult for bivalirudin Indication: ECMO + bilateral DVTs + [facility] 12/26   Recent Labs    10/10/20 1618 10/10/20 1724 10/11/20 0413 10/11/20 0821 10/11/20 1217 10/11/20 1612 10/11/20 1613  HGB 7.5*   < > 7.8*   < > 7.7* 7.9* 5.8*  HCT 24.6*   < > 25.4*   < > 26.0* 26.0* 17.0*  PLT 213   < > 144*  --  134* 154  --   APTT 58*  --  53*  --   --  60*  --   CREATININE 1.14  --  1.13  --  1.12 1.08  --    < > = values in this interval not displayed.    Estimated Creatinine Clearance: 86.4 mL/min (by C-G formula based on SCr of 1.08 mg/dL).   Assessment: 62 yom unvaccinated presenting with severe COVID ARDs, intubated on 12/6, continued to have ventilation issues despite NMB and proning - started on VV ECMO on 12/8. Pt noted to have bilateral DVTs 12/8. Bivalirudin started for ECMO anticoagulation. Head CT 12/26 with new small SAH, repeat 12/27 stable without expansion. Repeat heat CT 1/5 SAH improved.   APTT therapeutic at 60 sec on bivalirudin drip 0.06 mg/kg/hr. Hgb 7.9, plt 154. No s/sx of bleeding, no infusion issues. Fibrinogen 367, LDH 556.    Goal of Therapy:  aPTT 50-70 seconds  Monitor platelets by anticoagulation protocol: Yes   Plan:  Continue bivalirudin 0.06 mg/kg/hr Recheck aPTTs every 5a & 5p with ECMO labs.  Sherron Monday, PharmD, BCCCP Clinical Pharmacist  Phone: [phone] 10/11/2020 6:49 PM  Please check AMION for all [facility] Pharmacy phone numbers After 10:00 PM, call Main Pharmacy [phone]

## 2020-10-11 NOTE — Progress Notes (Signed)
Assisted tele visit to patient with family member.  Taiden Raybourn Anderson, RN   

## 2020-10-11 NOTE — Progress Notes (Addendum)
Nutrition Follow Up  DOCUMENTATION CODES:   Not applicable  INTERVENTION:   Holding on PEG-J given ongoing GOC discussions.   Continue tube feeding:  -Pivot 1.5 @ 65 ml/hr (1560 ml) via Cortrak -ProSource TF 45 ml QID -Free water flushes 200 ml Q2 hours   Provides: 2500 kcal, 190 grams protein, 1170 ml free water (3570 ml with flushes)  NUTRITION DIAGNOSIS:   Increased nutrient needs related to acute illness (COVID PNA) as evidenced by estimated needs   Ongoing  GOAL:   Patient will meet greater than or equal to 90% of their needs   Addressed via TF  MONITOR:   Vent status,Skin,Weight trends,Labs,I & O's,TF tolerance  REASON FOR ASSESSMENT:   Ventilator,Consult Enteral/tube feeding initiation and management  ASSESSMENT:   Patient with PMH significant for asthma, cancer, CHF, DM, HTN, sickle cell anemia, renal insufficiency, and hyperlipidemia. Presents this admission with severe COVID ARDS.   12/04- admit [facility] 120[facility]- hypoxia, intubated 12/07- transfer to [facility] for ECMO 12/08- VV ECMO cannulation, post pyloric Cortrak placed 12/09- extubated, re-intubated  12/13- circuit change 12/15- started PLEX 12/16- trach 12/27- started CRRT  12/31- vomited, Cortrak dislodged, NG placed 01/01- CRRT stopped, trickle TF restarted  01/05- post pyloric Cortrak placed   Pt discussed during ICU rounds and with RN.   Continues with periods of agitation. Requiring precedex. Worsening hypernatremia with holding of TF, now improving with addition of FWF. Holding on PEG-J placement for now giving ongoing GOC discussions.   Tolerating tube feeding at goal rate. Having BMs.    Admission weight: 101.9 kg  Current weight: 101.2 kg (up from 95.9 kg on 1/9)  Patient requiring ventilator support via trach  MV: 6.1 L/min Temp (24hrs), Avg:98.5 F (36.9 C), Min:98 F (36.7 C), Max:98.8 F (37.1 C)   UOP: 3460 ml x 24 hrs  Stool: 420 ml x 24 hrs   Drips: precedex  Medications:  colace, nutrisource fiber BID, 40 mg lasix BID, SS novolog, 5 mg reglan BID, miralax Labs: Na 156 (H)-trending down Mg 2.6 (H) CBG 94-177  Diet Order:   Diet Order    None      EDUCATION NEEDS:   Not appropriate for education at this time  Skin:  Skin Assessment: Skin Integrity Issues: Skin Integrity Issues:: Other (Comment) Other: skin tear- R ear, R arm device related wound Stage I: R face  Last BM:  1/12  Height:   Ht Readings from Last 1 Encounters:  10/06/20 5\' 9"  (1.753 m)    Weight:   Wt Readings from Last 1 Encounters:  10/11/20 101.2 kg    Adjusted Body Weight:  84.5 kg   BMI:  Body mass index is 32.95 kg/m.  Estimated Nutritional Needs:   Kcal:  12/09/20 kcal  Protein:  160-205 grams  Fluid:  >/= 2 L/day  [phone] RD, LDN Clinical Nutrition Pager listed in AMION

## 2020-10-12 ENCOUNTER — Inpatient Hospital Stay (HOSPITAL_COMMUNITY): Payer: HRSA Program

## 2020-10-12 DIAGNOSIS — J1282 Pneumonia due to coronavirus disease 2019: Secondary | ICD-10-CM | POA: Diagnosis not present

## 2020-10-12 DIAGNOSIS — U071 COVID-19: Secondary | ICD-10-CM | POA: Diagnosis not present

## 2020-10-12 DIAGNOSIS — J9601 Acute respiratory failure with hypoxia: Secondary | ICD-10-CM | POA: Diagnosis not present

## 2020-10-12 LAB — POCT I-STAT 7, (LYTES, BLD GAS, ICA,H+H)
Acid-Base Excess: 8 mmol/L — ABNORMAL HIGH (ref 0.0–2.0)
Acid-Base Excess: 8 mmol/L — ABNORMAL HIGH (ref 0.0–2.0)
Acid-Base Excess: 9 mmol/L — ABNORMAL HIGH (ref 0.0–2.0)
Bicarbonate: 35.7 mmol/L — ABNORMAL HIGH (ref 20.0–28.0)
Bicarbonate: 34.7 mmol/L — ABNORMAL HIGH (ref 20.0–28.0)
Bicarbonate: 35.3 mmol/L — ABNORMAL HIGH (ref 20.0–28.0)
Calcium, Ion: 1.32 mmol/L (ref 1.15–1.40)
Calcium, Ion: 1.32 mmol/L (ref 1.15–1.40)
Calcium, Ion: 1.3 mmol/L (ref 1.15–1.40)
HCT: 22 % — ABNORMAL LOW (ref 39.0–52.0)
HCT: 19 % — ABNORMAL LOW (ref 39.0–52.0)
HCT: 22 % — ABNORMAL LOW (ref 39.0–52.0)
Hemoglobin: 7.5 g/dL — ABNORMAL LOW (ref 13.0–17.0)
Hemoglobin: 6.5 g/dL — CL (ref 13.0–17.0)
Hemoglobin: 7.5 g/dL — ABNORMAL LOW (ref 13.0–17.0)
O2 Saturation: 90 %
O2 Saturation: 89 %
O2 Saturation: 78 %
Patient temperature: 37
Patient temperature: 37
Patient temperature: 37.2
Potassium: 3.8 mmol/L (ref 3.5–5.1)
Potassium: 4.7 mmol/L (ref 3.5–5.1)
Potassium: 4.5 mmol/L (ref 3.5–5.1)
Sodium: 159 mmol/L — ABNORMAL HIGH (ref 135–145)
Sodium: 156 mmol/L — ABNORMAL HIGH (ref 135–145)
Sodium: 158 mmol/L — ABNORMAL HIGH (ref 135–145)
TCO2: 38 mmol/L — ABNORMAL HIGH (ref 22–32)
TCO2: 37 mmol/L — ABNORMAL HIGH (ref 22–32)
TCO2: 37 mmol/L — ABNORMAL HIGH (ref 22–32)
pCO2 arterial: 69.6 mmHg (ref 32.0–48.0)
pCO2 arterial: 62.6 mmHg — ABNORMAL HIGH (ref 32.0–48.0)
pCO2 arterial: 63.7 mmHg — ABNORMAL HIGH (ref 32.0–48.0)
pH, Arterial: 7.318 — ABNORMAL LOW (ref 7.350–7.450)
pH, Arterial: 7.351 (ref 7.350–7.450)
pH, Arterial: 7.352 (ref 7.350–7.450)
pO2, Arterial: 65 mmHg — ABNORMAL LOW (ref 83.0–108.0)
pO2, Arterial: 62 mmHg — ABNORMAL LOW (ref 83.0–108.0)
pO2, Arterial: 47 mmHg — ABNORMAL LOW (ref 83.0–108.0)

## 2020-10-12 LAB — RENAL FUNCTION PANEL
Albumin: 2.2 g/dL — ABNORMAL LOW (ref 3.5–5.0)
Anion gap: 8 (ref 5–15)
BUN: 87 mg/dL — ABNORMAL HIGH (ref 6–20)
CO2: 32 mmol/L (ref 22–32)
Calcium: 8.7 mg/dL — ABNORMAL LOW (ref 8.9–10.3)
Chloride: 116 mmol/L — ABNORMAL HIGH (ref 98–111)
Creatinine, Ser: 1.19 mg/dL (ref 0.61–1.24)
GFR, Estimated: >60 mL/min (ref >60)
Glucose, Bld: 141 mg/dL — ABNORMAL HIGH (ref 70–99)
Phosphorus: 4.1 mg/dL (ref 2.5–4.6)
Potassium: 3.8 mmol/L (ref 3.5–5.1)
Sodium: 156 mmol/L — ABNORMAL HIGH (ref 135–145)

## 2020-10-12 LAB — CBC
HCT: 35 % — ABNORMAL LOW (ref 39.0–52.0)
HCT: 23.9 % — ABNORMAL LOW (ref 39.0–52.0)
Hemoglobin: 10.7 g/dL — ABNORMAL LOW (ref 13.0–17.0)
Hemoglobin: 7.1 g/dL — ABNORMAL LOW (ref 13.0–17.0)
MCH: 30.7 pg (ref 26.0–34.0)
MCH: 30.5 pg (ref 26.0–34.0)
MCHC: 30.6 g/dL (ref 30.0–36.0)
MCHC: 29.7 g/dL — ABNORMAL LOW (ref 30.0–36.0)
MCV: 100.3 fL — ABNORMAL HIGH (ref 80.0–100.0)
MCV: 102.6 fL — ABNORMAL HIGH (ref 80.0–100.0)
Platelets: 106 10*3/uL — ABNORMAL LOW (ref 150–400)
Platelets: 129 10*3/uL — ABNORMAL LOW (ref 150–400)
RBC: 3.49 MIL/uL — ABNORMAL LOW (ref 4.22–5.81)
RBC: 2.33 MIL/uL — ABNORMAL LOW (ref 4.22–5.81)
RDW: 18.4 % — ABNORMAL HIGH (ref 11.5–15.5)
RDW: 18.3 % — ABNORMAL HIGH (ref 11.5–15.5)
WBC: 6.3 10*3/uL (ref 4.0–10.5)
WBC: 9 10*3/uL (ref 4.0–10.5)
nRBC: 0.8 % — ABNORMAL HIGH (ref 0.0–0.2)
nRBC: 0.6 % — ABNORMAL HIGH (ref 0.0–0.2)

## 2020-10-12 LAB — GLUCOSE, CAPILLARY
Glucose-Capillary: 107 mg/dL — ABNORMAL HIGH (ref 70–99)
Glucose-Capillary: 131 mg/dL — ABNORMAL HIGH (ref 70–99)
Glucose-Capillary: 157 mg/dL — ABNORMAL HIGH (ref 70–99)
Glucose-Capillary: 178 mg/dL — ABNORMAL HIGH (ref 70–99)

## 2020-10-12 LAB — LACTIC ACID, PLASMA: Lactic Acid, Venous: 0.9 mmol/L (ref 0.5–1.9)

## 2020-10-12 LAB — APTT: aPTT: 58 seconds — ABNORMAL HIGH (ref 24–36)

## 2020-10-12 LAB — PREPARE RBC (CROSSMATCH)

## 2020-10-12 LAB — MAGNESIUM: Magnesium: 2.5 mg/dL — ABNORMAL HIGH (ref 1.7–2.4)

## 2020-10-12 LAB — FIBRINOGEN: Fibrinogen: 384 mg/dL (ref 210–475)

## 2020-10-12 LAB — LACTATE DEHYDROGENASE: LDH: 538 U/L — ABNORMAL HIGH (ref 98–192)

## 2020-10-12 MED ORDER — SODIUM CHLORIDE 0.9% IV SOLUTION: Freq: Once | INTRAVENOUS | Status: AC

## 2020-10-12 MED ORDER — FENTANYL CITRATE (PF) 100 MCG/2ML IJ SOLN
INTRAMUSCULAR | Status: AC
  Administered 2020-10-12: 200 ug
  Filled 2020-10-12: qty 2

## 2020-10-12 MED ORDER — FENTANYL CITRATE (PF) 100 MCG/2ML IJ SOLN
INTRAMUSCULAR | Status: AC
  Administered 2020-10-12: 100 ug
  Filled 2020-10-12: qty 4

## 2020-10-12 MED ORDER — MIDAZOLAM HCL 2 MG/2ML IJ SOLN
INTRAMUSCULAR | Status: AC
  Administered 2020-10-12: 5 mg
  Filled 2020-10-12: qty 4

## 2020-10-12 MED ORDER — MIDAZOLAM HCL 2 MG/2ML IJ SOLN
INTRAMUSCULAR | Status: AC
  Administered 2020-10-12: 2 mg
  Filled 2020-10-12: qty 2

## 2020-10-12 MED ORDER — MIDAZOLAM HCL 2 MG/2ML IJ SOLN
INTRAMUSCULAR | Status: AC
  Filled 2020-10-12: qty 2

## 2020-10-12 MED ORDER — POTASSIUM CHLORIDE 20 MEQ PO PACK
40.0000 meq | PACK | Freq: Once | ORAL | Status: AC
  Administered 2020-10-12: 40 meq
  Filled 2020-10-12: qty 2

## 2020-10-14 LAB — BPAM RBC
Blood Product Expiration Date: 202202112359
Blood Product Expiration Date: 202202112359
Blood Product Expiration Date: 202202132359
Blood Product Expiration Date: 202202132359
Blood Product Expiration Date: 202202152359
ISSUE DATE / TIME: 202201130935
Unit Type and Rh: 5100
Unit Type and Rh: 5100
Unit Type and Rh: 5100
Unit Type and Rh: 5100
Unit Type and Rh: 5100

## 2020-10-14 LAB — TYPE AND SCREEN
ABO/RH(D): O POS
Antibody Screen: NEGATIVE
Unit division: 0
Unit division: 0
Unit division: 0
Unit division: 0
Unit division: 0

## 2020-10-31 NOTE — Progress Notes (Signed)
ANTICOAGULATION CONSULT NOTE  Pharmacy Consult for bivalirudin Indication: ECMO + bilateral DVTs + [facility] 12/26   Recent Labs    10/11/20 0413 10/11/20 0821 10/11/20 1217 10/11/20 1612 10/11/20 1613 10/16/2020 0339 10/11/2020 0729 10/10/2020 0730  HGB 7.8*   < > 7.7* 7.9*   < > 10.7* 7.1* 6.5*  HCT 25.4*   < > 26.0* 26.0*   < > 35.0* 23.9* 19.0*  PLT 144*  --  134* 154  --  106* 129*  --   APTT 53*  --   --  60*  --  58*  --   --   CREATININE 1.13  --  1.12 1.08  --  1.19  --   --    < > = values in this interval not displayed.    Estimated Creatinine Clearance: 78 mL/min (by C-G formula based on SCr of 1.19 mg/dL).   Assessment: 66 yom unvaccinated presenting with severe COVID ARDs, intubated on 12/6, continued to have ventilation issues despite NMB and proning - started on VV ECMO on 12/8. Pt noted to have bilateral DVTs 12/8. Bivalirudin started for ECMO anticoagulation. Head CT 12/26 with new small SAH, repeat 12/27 stable without expansion. Repeat heat CT 1/5 SAH improved.   Today aPTT is therapeutic 58 seconds,  H/H remains low but stable, ongoing dark stools, no other overt S/Sx bleeding.  Goal of Therapy:  aPTT 50-70 seconds  Monitor platelets by anticoagulation protocol: Yes   Plan:  Continue bivalirudin 0.06 mg/kg/hr Recheck aPTTs every 5a & 5p with ECMO labs.  Fredonia Highland, PharmD, BCPS, [facility] Clinical Pharmacist [phone] Please check AMION for all [facility] Pharmacy numbers 10/05/2020

## 2020-10-31 NOTE — Death Summary Note (Signed)
DEATH SUMMARY   Patient Details  Name: Donald Rowe MRN: [id number] DOB: [birth year]  Admission/Discharge Information   Admit Date:  September 22, 2020  Date of Death: Date of Death: 01-Nov-2020  Time of Death: Time of Death: December 01, 1213  Length of Stay: December 01, 2038  Referring Physician: Jeni Salles, MD   Reason(s) for Hospitalization  covid and sob  Diagnoses  Preliminary cause of death: Acute respiratory distress syndrome (ARDS) due to COVID-19 virus [facility]) Secondary Diagnoses (including complications and co-morbidities):  Active Problems:   COVID-19   Hyponatremia   Palliative care by specialist   Goals of care, counseling/discussion   Encephalopathy   Acute respiratory failure with hypoxia (HCC)   Pressure injury of skin   Brief Hospital Course (including significant findings, care, treatment, and services provided and events leading to death)  Donald Rowe is a 60 y.o. year old male who has a pertinent history of hypothyroidism on levothyroxine and hyperlipidemia who was diagnosed with Covid 1 week ago and is not vaccinated.  With EMS he was seen to be D satting to 78% on room air.  He states he started having symptoms on Friday and tested positive on Saturday.  He was having some sniffles earlier on in the week and just the past day or so has developed shortness of breath, associated symptoms include hemoptysis and was having some calf tenderness.  In the ED patient was increased to 6 L oxygen satting about 92%, WBC 8.1, Hgb 15.2, PLT 139, lactic acid 1.5, D-dimer 1.55, procalcitonin 0.18, ferritin 902, triglycerides 101, fibrinogen 556, CRP 8.3, troponin 12, LDH 525, NA 133, K4.4, CA 8.6, albumin 3.3, AST 68, Covid again was positive.  In the emergency department he was started on remdesivir and given dexamethasone DVT US was negative and CT scan was negative for PE but showed other findings.  Unfortunately pt deteriorated quickly req 60L and 100% HHFNC on 12/6 and ccm was consulted.  Pt had elected for DNR/DNI previously but has redacted that and was emergently intubated 12/6. He was immediately proned and paralyzed without improvement and ECMO consult 12/8 was provided. Pt was cannulated 12/8 for VV support.   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 He developed BLE dvt. He has been treated with remdesivir, baricitinib and steroids. Nephrology was consulted with concern for MAHA 2/2 covid and possibility of PLEX treatments. Pt received 4 exchanges and was started on crrt for volume removal after for a few days.   Pt's renal function remained relatively stable and he was eventually taken off crrt and tolerated large volume diuresis.   Pt imaging had some improvement over the course of his 36 day run but unfortunately his agitated/hyperactive delirium inhibited his rehabilitation during his therapy. He remained intermittently oversedated for safety of oxygenation and cannula and then awake but profoundly weak unable to participate.   He developed MRSA pna x2 and had just completed tx for 2nd round. He also developed a sah on 12/26.   Despite all efforts pt had failure to progress and we were increasing on circuit suport unable to make progress. Family meetings were ongoing and on Tuesday 11/11 pt's wife and daughter did graciously opt for dnr and no peg at this time but to cont with aggressive care for now.   On 1/12 overnight pt began having bleeding in trach with increased secretions. He was requiring freq transfusions. His mental status appeared worse. His oxygenation certainlly down. His wife was notified and arrived at bedside  and duaghter presented shortly after. Pt ceased having spontaneous respirations oxygen cont decreasing despite max flows.  Pt was provided comfort medications and disconnected from vent and circuit was stopped   Pertinent Labs and Studies  Significant Diagnostic Studies DG Abd 1 View  Result Date:  09/30/2020 CLINICAL DATA:  ECMO, vomiting EXAM: ABDOMEN - 1 VIEW COMPARISON:  Chest radiograph from earlier today. 09/27/2020 abdominal radiograph FINDINGS: Interval removal of enteric tube. Moderate gaseous distention of the stomach. Right common femoral central venous catheter terminates in the region of the right common iliac vein. No dilated small bowel loops. No evidence of pneumatosis or pneumoperitoneum. Mild colonic gas and stool. IMPRESSION: Interval removal of enteric tube. Moderate gaseous distention of the stomach. Nonobstructive bowel gas pattern. Electronically Signed   By: Ilona Sorrel M.D.   On: 09/30/2020 09:06   CT HEAD WO CONTRAST  Result Date: 10/04/2020 CLINICAL DATA:  Subarachnoid hemorrhage. EXAM: CT HEAD WITHOUT CONTRAST TECHNIQUE: Contiguous axial images were obtained from the base of the skull through the vertex without intravenous contrast. COMPARISON:  September 25, 2020. FINDINGS: Brain: No evidence of acute infarction, hemorrhage, hydrocephalus, extra-axial collection or mass lesion/mass effect. Subarachnoid hemorrhage noted on prior exam is not visualized currently. Vascular: No hyperdense vessel or unexpected calcification. Skull: Normal. Negative for fracture or focal lesion. Sinuses/Orbits: No acute finding. Other: Fluid is noted in mastoid air cells bilaterally. IMPRESSION: Subarachnoid hemorrhage noted on prior exam is not visualized currently. No acute intracranial abnormality seen. Electronically Signed   By: Marijo Conception M.D.   On: 10/04/2020 10:03   CT HEAD WO CONTRAST  Result Date: 09/25/2020 CLINICAL DATA:  Subarachnoid hemorrhage follow-up. EXAM: CT HEAD WITHOUT CONTRAST TECHNIQUE: Contiguous axial images were obtained from the base of the skull through the vertex without intravenous contrast. COMPARISON:  CT head December 26 21. FINDINGS: Brain: No substantial change in small volume subarachnoid hemorrhage within the left sylvian fissure. No evidence of new  hemorrhage. No evidence of acute large vascular territory infarct. No abnormal mass effect or midline shift. No hydrocephalus. No mass lesion. Vascular: Calcific atherosclerosis. No hyperdense vessel identified. Skull: No acute fracture. Sinuses/Orbits: Frothy secretions and mucosal thickening the right sphenoid sinus. Other: Bilateral large mastoid effusions. IMPRESSION: 1. No substantial change in small volume subarachnoid hemorrhage within the left sylvian fissure. No evidence of new hemorrhage. 2. Bilateral large mastoid effusions. Electronically Signed   By: Margaretha Sheffield MD   On: 09/25/2020 10:05   CT HEAD WO CONTRAST  Result Date: 09/24/2020 CLINICAL DATA:  Altered mental status, currently on ECMO EXAM: CT HEAD WITHOUT CONTRAST TECHNIQUE: Contiguous axial images were obtained from the base of the skull through the vertex without intravenous contrast. COMPARISON:  09/07/2020 FINDINGS: Brain: Minimal subarachnoid hemorrhage is noted in the left sylvian fissure. This is new from the prior exam. No other areas of subarachnoid hemorrhage are noted. Vascular: No hyperdense vessel or unexpected calcification. Skull: Normal. Negative for fracture or focal lesion. Sinuses/Orbits: No acute finding. Other: Fluid is noted within the mastoid air cells bilaterally new from the prior exam. IMPRESSION: New subarachnoid hemorrhage on the left in the sylvian fissure. Mastoid air cell effusions bilaterally. Critical Value/emergent results were called by telephone at the time of interpretation on 09/24/2020 at 10:56 am to Warden Fillers, ECMO specialist who will relay findings to Dr. Glori Bickers. Electronically Signed   By: Inez Catalina M.D.   On: 09/24/2020 10:57   CT CHEST WO CONTRAST  Result Date: 10/04/2020 CLINICAL DATA:  History of COVID-19 positivity with respiratory distress EXAM: CT CHEST WITHOUT CONTRAST TECHNIQUE: Multidetector CT imaging of the chest was performed following the standard protocol  without IV contrast. COMPARISON:  CT from 09/24/2020, chest x-ray from earlier in the same day. FINDINGS: Cardiovascular: Thoracic aorta is well visualize without aneurysmal dilatation. No significant cardiac enlargement is noted. Relative decreased attenuation of the cardiac blood pool is noted suggestive of underlying anemia. No dilatation of the pulmonary artery is seen. ECMO cannula is noted on the right extending into the IVC. Coronary calcifications are noted. Left-sided PICC line is noted in place. Mediastinum/Nodes: Thoracic inlet is within normal limits. Tracheostomy tube is noted in place. Gastric catheter extends into the stomach. No sizable hilar or mediastinal adenopathy is noted. The esophagus as visualized is within normal limits. Previously seen pneumomediastinum has resolved in the interval. Lungs/Pleura: Diffuse bilateral airspace opacity is again identified but slightly improved when compared with the prior exam. No sizable effusions are noted. Upper Abdomen: Visualized upper abdomen shows no acute abnormality. Musculoskeletal: No chest wall mass or suspicious bone lesions identified. IMPRESSION: Diffuse airspace opacities and edema consistent with the known history of ARDS and COVID-19 pneumonia. Improved aeration is noted when compared with the prior CT. Resolution of previously seen pneumomediastinum. Tubes and lines as described above. Electronically Signed   By: Inez Catalina M.D.   On: 10/04/2020 10:10   CT CHEST W CONTRAST  Result Date: 09/24/2020 CLINICAL DATA:  ECMO patient, acute drop in hemoglobin, hypotension, retroperitoneal hematoma suspected EXAM: CT CHEST, ABDOMEN, AND PELVIS WITH CONTRAST TECHNIQUE: Multidetector CT imaging of the chest, abdomen and pelvis was performed following the standard protocol during bolus administration of intravenous contrast. CONTRAST:  18m OMNIPAQUE IOHEXOL 300 MG/ML  SOLN COMPARISON:  None. FINDINGS: CT CHEST FINDINGS Cardiovascular: Right  internal jugular approach veno venous ECMO cannula, tip positioned in the superior abdominal inferior vena cava. Left upper extremity PICC, tip positioned within the mid SVC. Normal heart size. Left coronary artery calcifications. No pericardial effusion. Mediastinum/Nodes: No enlarged mediastinal, hilar, or axillary lymph nodes. Extensive pneumomediastinum. Tracheostomy. Thyroid and esophagus demonstrate no significant findings. Lungs/Pleura: Examination of the lungs is significantly limited by breath motion artifact. There is very extensive, dense heterogeneous airspace opacity and consolidation throughout the lungs with small bilateral pleural effusions. No significant pneumothorax. Musculoskeletal: No chest wall mass or suspicious bone lesions identified. There is subpectoral emphysema about the chest wall (series 3, image 17). CT ABDOMEN PELVIS FINDINGS Hepatobiliary: No solid liver abnormality is seen. No gallstones, gallbladder wall thickening, or biliary dilatation. Pancreas: Unremarkable. No pancreatic ductal dilatation or surrounding inflammatory changes. Spleen: Normal in size without significant abnormality. Adrenals/Urinary Tract: Adrenal glands are unremarkable. Kidneys are normal, without renal calculi, solid lesion, or hydronephrosis. Foley catheter within the urinary bladder. Stomach/Bowel: Stomach is within normal limits. Enteric feeding tube is position with tip in the proximal jejunum. Appendix appears normal. No evidence of bowel wall thickening, distention, or inflammatory changes. Descending and sigmoid diverticulosis. Rectal tube. Vascular/Lymphatic: Aortic atherosclerosis. Right femoral venous catheter, tip position in the external iliac vein (series 3, image 107). No enlarged abdominal or pelvic lymph nodes. Reproductive: No mass or other abnormality. Other: No abdominal wall hernia or abnormality. Anasarca. Trace ascites. Musculoskeletal: No acute or significant osseous findings.  IMPRESSION: 1. No evidence of retroperitoneal hematoma or other source of hemorrhage in the chest, abdomen, or pelvis. 2. Extensive pneumomediastinum and subpectoral emphysema about the chest wall, likely due to barotrauma. 3. Examination of the lungs is  significantly limited by breath motion artifact. There is very extensive, dense heterogeneous airspace opacity and consolidation throughout the lungs with small bilateral pleural effusions. Findings are most consistent with ARDS. 4. Support apparatus including tracheostomy and venovenous ECMO as detailed above. 5. Trace ascites and anasarca. 6. Coronary artery disease. Aortic Atherosclerosis (ICD10-I70.0). Electronically Signed   By: Eddie Candle M.D.   On: 09/24/2020 10:53   CT ABDOMEN PELVIS W CONTRAST  Result Date: 09/24/2020 CLINICAL DATA:  ECMO patient, acute drop in hemoglobin, hypotension, retroperitoneal hematoma suspected EXAM: CT CHEST, ABDOMEN, AND PELVIS WITH CONTRAST TECHNIQUE: Multidetector CT imaging of the chest, abdomen and pelvis was performed following the standard protocol during bolus administration of intravenous contrast. CONTRAST:  174m OMNIPAQUE IOHEXOL 300 MG/ML  SOLN COMPARISON:  None. FINDINGS: CT CHEST FINDINGS Cardiovascular: Right internal jugular approach veno venous ECMO cannula, tip positioned in the superior abdominal inferior vena cava. Left upper extremity PICC, tip positioned within the mid SVC. Normal heart size. Left coronary artery calcifications. No pericardial effusion. Mediastinum/Nodes: No enlarged mediastinal, hilar, or axillary lymph nodes. Extensive pneumomediastinum. Tracheostomy. Thyroid and esophagus demonstrate no significant findings. Lungs/Pleura: Examination of the lungs is significantly limited by breath motion artifact. There is very extensive, dense heterogeneous airspace opacity and consolidation throughout the lungs with small bilateral pleural effusions. No significant pneumothorax. Musculoskeletal:  No chest wall mass or suspicious bone lesions identified. There is subpectoral emphysema about the chest wall (series 3, image 17). CT ABDOMEN PELVIS FINDINGS Hepatobiliary: No solid liver abnormality is seen. No gallstones, gallbladder wall thickening, or biliary dilatation. Pancreas: Unremarkable. No pancreatic ductal dilatation or surrounding inflammatory changes. Spleen: Normal in size without significant abnormality. Adrenals/Urinary Tract: Adrenal glands are unremarkable. Kidneys are normal, without renal calculi, solid lesion, or hydronephrosis. Foley catheter within the urinary bladder. Stomach/Bowel: Stomach is within normal limits. Enteric feeding tube is position with tip in the proximal jejunum. Appendix appears normal. No evidence of bowel wall thickening, distention, or inflammatory changes. Descending and sigmoid diverticulosis. Rectal tube. Vascular/Lymphatic: Aortic atherosclerosis. Right femoral venous catheter, tip position in the external iliac vein (series 3, image 107). No enlarged abdominal or pelvic lymph nodes. Reproductive: No mass or other abnormality. Other: No abdominal wall hernia or abnormality. Anasarca. Trace ascites. Musculoskeletal: No acute or significant osseous findings. IMPRESSION: 1. No evidence of retroperitoneal hematoma or other source of hemorrhage in the chest, abdomen, or pelvis. 2. Extensive pneumomediastinum and subpectoral emphysema about the chest wall, likely due to barotrauma. 3. Examination of the lungs is significantly limited by breath motion artifact. There is very extensive, dense heterogeneous airspace opacity and consolidation throughout the lungs with small bilateral pleural effusions. Findings are most consistent with ARDS. 4. Support apparatus including tracheostomy and venovenous ECMO as detailed above. 5. Trace ascites and anasarca. 6. Coronary artery disease. Aortic Atherosclerosis (ICD10-I70.0). Electronically Signed   By: AEddie CandleM.D.   On:  09/24/2020 10:53   DG CHEST PORT 1 VIEW  Result Date: 102/05/22CLINICAL DATA:  60year old male CCOVID-63  ECMO. EXAM: PORTABLE CHEST 1 VIEW COMPARISON:  Portable chest 10/11/2020 and earlier. FINDINGS: Portable AP semi upright view at 0559 hours. Stable tracheostomy, visible enteric feeding tube. Stable left PICC line, right ECMO cannula. Mildly improved lung volumes. Stable visible mediastinal contours. Diffuse coarse pulmonary opacity with mostly peripheral and basilar more confluent opacity and air bronchograms. Ventilation has mildly improved from 10/09/2020, not significantly changed from 10/10/2020. No pneumothorax or pleural effusion is evident. Paucity of upper abdominal bowel gas. IMPRESSION: 1.  Stable lines and tubes. 2. Severe bilateral pneumonia with stable ventilation recently, remaining mildly improved from 10/09/2020. Electronically Signed   By: Genevie Ann M.D.   On: 11-07-2020 06:50   DG CHEST PORT 1 VIEW  Result Date: 10/11/2020 CLINICAL DATA:  59 year old male COVID-47.  ECMO. EXAM: PORTABLE CHEST 1 VIEW COMPARISON:  Portable chest 10/10/2020 and earlier. FINDINGS: Portable AP semi upright view at 0540 hours. Mildly rotated to the left today. Stable tracheostomy tube. Stable left PICC line, visible enteric feeding tube, right chest ECMO cannula. Stable low lung volumes. No pneumothorax or definite pleural effusion. Widespread bilateral coarse and confluent pulmonary opacity with perihilar air bronchograms. Ventilation has mildly improved since 10/09/2020, not significantly different from yesterday. Paucity of bowel gas in the upper abdomen. Stable visualized osseous structures. IMPRESSION: 1.  Stable lines and tubes. 2. Widespread bilateral pneumonia/ARDS with mildly improved ventilation in both lungs since 10/09/2020. Electronically Signed   By: Genevie Ann M.D.   On: 10/11/2020 06:55   DG CHEST PORT 1 VIEW  Result Date: 10/10/2020 CLINICAL DATA:  ECMO.  COVID. EXAM: PORTABLE CHEST 1 VIEW  COMPARISON:  10/09/2020. FINDINGS: Left PICC line noted with tip over right atrium. Tracheostomy tube, feeding tube, in stable position. ECMO device in stable position. Heart size stable. Low lung volumes. Diffuse severe bilateral pulmonary infiltrates/edema. No pleural effusion or pneumothorax. IMPRESSION: 1. Left PICC line noted with tip over right atrium. Tracheostomy tube, feeding tube, ECMO device in stable position. 2. Low lung volumes. Diffuse severe bilateral pulmonary infiltrates again noted. Electronically Signed   By: Marcello Moores  Register   On: 10/10/2020 06:24   DG CHEST PORT 1 VIEW  Result Date: 10/09/2020 CLINICAL DATA:  ECMO EXAM: PORTABLE CHEST 1 VIEW COMPARISON:  Yesterday FINDINGS: Tracheostomy and feeding tubes in unremarkable in stable position. Unchanged ECMO catheter and left PICC position. Confluent bilateral pneumonia without interval change. Prominent heart size accentuated by fat based on CT. No visible effusion or air leak. IMPRESSION: Stable hardware positioning and confluent pneumonia. Electronically Signed   By: Monte Fantasia M.D.   On: 10/09/2020 06:19   DG CHEST PORT 1 VIEW  Result Date: 10/08/2020 CLINICAL DATA:  Echo EXAM: PORTABLE CHEST 1 VIEW COMPARISON:  October 07, 2020 FINDINGS: The cardiomediastinal silhouette is unchanged and obscured.ECMO cannulation support apparatus. The enteric tube courses through the chest to the abdomen beyond the field-of-view. Tracheostomy. LEFT upper extremity PICC tip terminates over the superior cavoatrial junction. No pleural effusion. No pneumothorax. Diffuse heterogeneous opacities with a peripheral consolidative predominance, unchanged. Visualized abdomen is unremarkable. No acute osseous abnormality. IMPRESSION: 1.  Support apparatus as described above. 2. Diffuse heterogeneous opacities with a peripheral consolidative predominance, unchanged. Electronically Signed   By: Valentino Saxon MD   On: 10/08/2020 08:52   DG CHEST PORT 1  VIEW  Result Date: 10/07/2020 CLINICAL DATA:  Personal history of ECMO EXAM: PORTABLE CHEST 1 VIEW COMPARISON:  Yesterday FINDINGS: ECMO catheter in similar position. Unremarkable positioning of left PICC, tracheostomy tube, and feeding tube. Unchanged confluent pneumonia. No visible effusion or pneumothorax. Cardiomegaly. IMPRESSION: Stable hardware positioning and bilateral pneumonia. Electronically Signed   By: Monte Fantasia M.D.   On: 10/07/2020 07:37   DG CHEST PORT 1 VIEW  Result Date: 10/06/2020 CLINICAL DATA:  ECMO.  COVID. EXAM: PORTABLE CHEST 1 VIEW COMPARISON:  Yesterday FINDINGS: Confluent airspace disease. ECMO catheter in stable position. Tracheostomy tube in place. The enteric tube at least reaches the stomach. Left subclavian line with tip at  the SVC. No air leak. Normal heart size. IMPRESSION: Stable hardware positioning and confluent pneumonia. Electronically Signed   By: Monte Fantasia M.D.   On: 10/06/2020 06:50   DG CHEST PORT 1 VIEW  Result Date: 10/05/2020 CLINICAL DATA:  Tracheostomy.  ECMO.  Recent COVID. EXAM: PORTABLE CHEST 1 VIEW COMPARISON:  CT 10/04/2020.  Chest x-ray 10/04/2020. FINDINGS: Interim removal of NG tube and placement of feeding tube. Tracheostomy tube, left PICC line, ECMO catheter stable position. Stable cardiomegaly. Diffuse severe bilateral pulmonary infiltrates again noted without interim change. No pleural effusion or pneumothorax. IMPRESSION: 1. Interim removal of NG tube and placement of feeding tube. 2. Remaining lines and tubes stable. 3. Diffuse severe bilateral pulmonary infiltrates again noted without interim change. Electronically Signed   By: Marcello Moores  Register   On: 10/05/2020 06:29   DG CHEST PORT 1 VIEW  Result Date: 10/04/2020 CLINICAL DATA:  Respiratory failure, COVID pneumonia, ECMO EXAM: PORTABLE CHEST 1 VIEW COMPARISON:  10/03/2020 FINDINGS: Tracheostomy, nasogastric tube extending into the upper abdomen beyond the margin of the examination,  and left upper extremity PICC line with its tip within the superior vena cava are unchanged. ECMO cannula has been slightly withdrawn with its proximal marker overlying the superior vena cava, its middle marker over the expected superior cavoatrial junction, and its distal marker likely within the a inferior right atrium.l Pulmonary insufflation is preserved. Lung volumes are small. Extensive diffuse airspace infiltrate appears stable with numerous air bronchograms noted within the lung bases bilaterally. No pneumothorax or pleural effusion. IMPRESSION: ECMO catheter slightly withdrawn. Advancement of approximately 3-4 cm may better position the middle cannula within the right atrium. Otherwise stable support lines and tubes. Stable pulmonary insufflation.  Lung volumes are small. Stable diffuse extensive airspace infiltrate, likely infectious. Electronically Signed   By: Fidela Salisbury MD   On: 10/04/2020 06:43   DG CHEST PORT 1 VIEW  Result Date: 10/03/2020 CLINICAL DATA:  Tracheostomy.  ECMO.  Recent COVID. EXAM: PORTABLE CHEST 1 VIEW COMPARISON:  10/02/2020. FINDINGS: Tracheostomy tube, NG tube, left PICC line, ECMO device in stable position. Heart size stable. Low lung volumes. Diffuse severe bilateral pulmonary infiltrates again noted without interim change. No pleural effusion or pneumothorax. IMPRESSION: 1. Lines and tubes in stable position. 2. Low lung volumes. Diffuse severe bilateral pulmonary infiltrates again noted without interim change. Electronically Signed   By: Marcello Moores  Register   On: 10/03/2020 06:05   DG CHEST PORT 1 VIEW  Result Date: 10/02/2020 CLINICAL DATA:  Respiratory failure. EXAM: PORTABLE CHEST 1 VIEW COMPARISON:  10/01/2020 FINDINGS: 0530 hours. Tracheostomy tube again noted. The NG tube passes into the stomach although the distal tip position is not included on the film. ECMO cannula remains in place. Left PICC line tip overlies the proximal to mid SVC level. Diffuse bilateral  airspace disease is similar. IMPRESSION: 1. No substantial interval change in cardiopulmonary exam. 2. Stable support apparatus. Electronically Signed   By: Misty Stanley M.D.   On: 10/02/2020 06:17   DG CHEST PORT 1 VIEW  Result Date: 10/01/2020 CLINICAL DATA:  60 year old male COVID-58.  On ECMO. EXAM: PORTABLE CHEST 1 VIEW COMPARISON:  Portable chest 09/30/2020 and earlier. FINDINGS: Portable AP supine view at 0527 hours. Stable tracheostomy, ECMO cannula. Stable left PICC line. Enteric tube has been placed and courses to the stomach with tip not included. Interval resolved gas distended stomach in the visible abdomen. Diffuse bilateral pulmonary opacity, more confluent in both lungs since yesterday with increased obscuration  of the mediastinum. No pneumothorax identified. No pleural effusion is evident. Paucity of bowel gas now in the upper abdomen. Stable visualized osseous structures. IMPRESSION: 1. Worsening, now severe bilateral pulmonary opacity. Obscured mediastinal contours. No pneumothorax. 2. Enteric tube placed and stomach decompressed. 3. Otherwise stable lines and tubes. Electronically Signed   By: Genevie Ann M.D.   On: 10/01/2020 07:37   DG CHEST PORT 1 VIEW  Result Date: 09/30/2020 CLINICAL DATA:  ECMO, vomiting EXAM: PORTABLE CHEST 1 VIEW COMPARISON:  Chest radiograph from one day prior. FINDINGS: Tracheostomy tube tip overlies the tracheal air column at the thoracic inlet. Left PICC terminates over the middle third of the SVC. Interval removal of enteric tube. Stable ECMO catheter extending from the lower right neck into the medial upper right abdomen. Stable cardiomediastinal silhouette with normal heart size. No pneumothorax. Stable mild bilateral pleural thickening. Extensive patchy opacities throughout both lungs with low lung volumes and probable diffuse mild bronchiectasis, not substantially changed. IMPRESSION: 1. Support structures as detailed. No pneumothorax. 2. Stable extensive  patchy opacities throughout both lungs with low lung volumes and probable diffuse mild bronchiectasis. Electronically Signed   By: Ilona Sorrel M.D.   On: 09/30/2020 08:48   DG CHEST PORT 1 VIEW  Result Date: 09/29/2020 CLINICAL DATA:  ECMO.  Recent COVID EXAM: PORTABLE CHEST 1 VIEW COMPARISON:  09/28/2020 FINDINGS: Support devices remain in place, unchanged. Extensive bilateral airspace disease, unchanged. No visible effusions or pneumothorax. IMPRESSION: No change severe bilateral airspace disease. Electronically Signed   By: Rolm Baptise M.D.   On: 09/29/2020 07:33   DG CHEST PORT 1 VIEW  Result Date: 09/28/2020 CLINICAL DATA:  60 year old male COVID-71. EXAM: PORTABLE CHEST 1 VIEW COMPARISON:  Portable chest 09/27/2020 and earlier. FINDINGS: Portable AP semi upright view at 0527 hours. Stable ECMO cannula. Stable tracheostomy tube, left PICC line, visible enteric feeding tube. Mildly lower lung volumes. Mediastinal contours remain within normal limits. No pneumothorax. Patchy and confluent bilateral pulmonary opacity. Improved perihilar ventilation since 09/26/2020. No definite pleural effusion. Paucity of bowel gas in the upper abdomen. IMPRESSION: 1.  Stable lines and tubes. 2. Severe bilateral pneumonia with mildly improved ventilation in both lungs since 09/26/2020. Electronically Signed   By: Genevie Ann M.D.   On: 09/28/2020 07:21   DG CHEST PORT 1 VIEW  Result Date: 09/27/2020 CLINICAL DATA:  Recent COVID-19 positive. Respiratory failure. ECMO therapy EXAM: PORTABLE CHEST 1 VIEW COMPARISON:  September 26, 2020 FINDINGS: Tracheostomy catheter tip is 6.3 cm above the carina. ECMO catheter tip is in the inferior vena cava. Peripherally inserted left-sided central catheter tip in superior vena cava. Enteric tube tip is below the diaphragm. No pneumothorax. Widespread airspace opacity persists without appreciable change. Heart size and pulmonary vascularity are normal. No adenopathy. No bone lesions.  IMPRESSION: Tube and catheter positions as described without pneumothorax. Widespread airspace opacity persists without appreciable change. Stable cardiac silhouette. Electronically Signed   By: Lowella Grip III M.D.   On: 09/27/2020 08:00   DG CHEST PORT 1 VIEW  Result Date: 09/26/2020 CLINICAL DATA:  ECMO. EXAM: PORTABLE CHEST 1 VIEW COMPARISON:  09/25/2020.  09/24/2020.  CT 09/24/2020. FINDINGS: , Feeding tube, left PICC line, ECMO device in stable position. Heart size stable. Dense bilateral pulmonary infiltrates are again noted. Very slight improvement in aeration noted on today's exam. No pleural effusion or pneumothorax. IMPRESSION: 1. Lines and tubes in stable position. 2. Dense bilateral pulmonary infiltrates again noted. Very slight improvement in aeration noted  on today's exam. Electronically Signed   By: Marcello Moores  Register   On: 09/26/2020 06:07   DG CHEST PORT 1 VIEW  Result Date: 09/25/2020 CLINICAL DATA:  Respiratory failure. COVID pneumonia. ECMO. EXAM: PORTABLE CHEST 1 VIEW COMPARISON:  09/24/2020 FINDINGS: The tracheostomy tube is stable. The feeding tube is stable. Left PICC line is stable. ECMO catheter is unchanged. Persistent diffuse airspace process in the lungs. No large pleural effusion or pneumothorax. IMPRESSION: 1. Stable support apparatus. 2. Persistent diffuse airspace process. Electronically Signed   By: Marijo Sanes M.D.   On: 09/25/2020 07:43   DG CHEST PORT 1 VIEW  Result Date: 09/24/2020 CLINICAL DATA:  Check tracheostomy placement EXAM: PORTABLE CHEST 1 VIEW COMPARISON:  09/23/2020 FINDINGS: Cardiac shadow is stable. Tracheostomy tube, left-sided PICC line and feeding catheter are again noted and stable. ECMO cannula is noted extending into the IVC. Diffuse bilateral airspace disease is again noted and stable. Changes of pneumomediastinum are noted but decreased. Small right pleural effusion is noted. No bony abnormality is seen. IMPRESSION: Relatively stable  appearance of the chest although some decrease in the degree of pneumomediastinum is noted. Electronically Signed   By: Inez Catalina M.D.   On: 09/24/2020 08:00   DG CHEST PORT 1 VIEW  Result Date: 09/23/2020 CLINICAL DATA:  ECMO. EXAM: PORTABLE CHEST 1 VIEW COMPARISON:  09/22/2020 FINDINGS: 0704 hours. Tracheostomy and feeding tubes again noted. ECMO cannula overlies the medial right chest. Right PICC line has been removed in the interval. Diffuse bilateral airspace disease ease again noted. Prominent pneumomediastinum persists. Left PICC line is new in the interval with tip overlying the SVC/RA junction. IMPRESSION: *New left PICC line tip overlies the SVC/RA junction. *Interval removal of right PICC line. *Persistent diffuse bilateral airspace disease with pneumomediastinum. Electronically Signed   By: Misty Stanley M.D.   On: 09/23/2020 08:41   DG CHEST PORT 1 VIEW  Result Date: 09/22/2020 CLINICAL DATA:  COVID positive.  ECMO. EXAM: PORTABLE CHEST 1 VIEW COMPARISON:  09/21/2020 FINDINGS: 0540 hours. Tracheostomy and feeding tubes again noted. ECMO cannula appears stable given differential positioning. Right PICC line tip is obscured by the ECMO cannula. Diffuse bilateral airspace disease is similar. Persistent pneumomediastinum. Telemetry leads overlie the chest. IMPRESSION: No substantial interval change in exam. Diffuse bilateral airspace disease with pneumomediastinum. Electronically Signed   By: Misty Stanley M.D.   On: 09/22/2020 06:39   DG CHEST PORT 1 VIEW  Result Date: 09/21/2020 CLINICAL DATA:  Hypoxia EXAM: PORTABLE CHEST 1 VIEW COMPARISON:  September 20, 2020 FINDINGS: Tracheostomy catheter tip is 6.1 cm above the carina. ECMO catheter tip is in the inferior vena cava. Enteric tube tip is below the diaphragm. No pneumothorax. Pneumomediastinum, however, is present, similar in appearance to 1 day prior. Subcutaneous air noted on the left. Airspace opacity throughout the lungs bilaterally  is stable with small pleural effusions bilaterally. Heart is mildly enlarged with pulmonary vascularity normal. No adenopathy. No bone lesions. IMPRESSION: Tube and catheter positions as described without pneumothorax. Pneumomediastinum again noted, similar to 1 day prior. Subcutaneous air now present on the left. Widespread airspace opacity bilaterally without appreciable change. Stable cardiac prominence. Electronically Signed   By: Lowella Grip III M.D.   On: 09/21/2020 07:54   DG CHEST PORT 1 VIEW  Result Date: 09/20/2020 CLINICAL DATA:  Intubation.  COVID-19. EXAM: PORTABLE CHEST 1 VIEW COMPARISON:  Yesterday FINDINGS: New lucency around the mediastinum on both sides. No soft tissue emphysema or pneumothorax. Confluent airspace disease  on both sides. Stable positioning of PICC, ECMO catheter, feeding tube, and tracheostomy tube. Stable borderline heart size. IMPRESSION: 1. New pneumomediastinum.  No pneumothorax. 2. Stable hardware positioning and confluent pulmonary opacification. Electronically Signed   By: Monte Fantasia M.D.   On: 09/20/2020 08:14   DG CHEST PORT 1 VIEW  Result Date: 09/19/2020 CLINICAL DATA:  ECMO.  COVID. EXAM: PORTABLE CHEST 1 VIEW COMPARISON:  09/18/2020. FINDINGS: Interim removal of left IJ line. Tracheostomy tube, feeding tube, right PICC line, and ECMO device in stable position. Heart size stable. Diffuse severe bilateral pulmonary infiltrates are again noted without interim change. Bilateral layering pleural effusions again cannot be excluded. No pneumothorax. IMPRESSION: 1. Interim removal of left IJ line. Remaining lines and tubes including ECMO device in stable position. 2. Diffuse severe bilateral pulmonary infiltrates are again noted without interim change. Bilateral layering pleural effusions again cannot be excluded. Electronically Signed   By: Marcello Moores  Register   On: 09/19/2020 06:44   DG CHEST PORT 1 VIEW  Result Date: 09/18/2020 CLINICAL DATA:   Tracheostomy.  ECMO.  Recent COVID. EXAM: PORTABLE CHEST 1 VIEW COMPARISON:  09/17/2020. FINDINGS: Tracheostomy tube, left IJ line, feeding tube, right PICC line, ECMO device in stable position. Heart size normal. Diffuse dense bilateral pulmonary infiltrates are again noted. Very slight improvement in aeration may be present. Bilateral layering pleural effusions again cannot be excluded. No pneumothorax. IMPRESSION: 1. Lines and tubes in stable position. 2. Diffuse dense bilateral pulmonary infiltrates are again noted. Very slight improvement in aeration may be present. Bilateral layering pleural effusions again cannot be excluded. Electronically Signed   By: Marcello Moores  Register   On: 09/18/2020 06:39   DG CHEST PORT 1 VIEW  Result Date: 09/17/2020 CLINICAL DATA:  Acute respiratory failure. Endotracheally intubated. EXAM: PORTABLE CHEST 1 VIEW COMPARISON:  09/16/2020 FINDINGS: Support lines and tubes in appropriate position. No pneumothorax visualized. Severe bilateral diffuse pulmonary airspace disease shows no significant change. Probable layering bilateral pleural effusions for noted in the upper lung zones. : No significant change in severe bilateral diffuse pulmonary airspace disease and probable layering pleural effusions. Electronically Signed   By: Marlaine Hind M.D.   On: 09/17/2020 11:06   DG CHEST PORT 1 VIEW  Result Date: 09/16/2020 CLINICAL DATA:  ECMO EXAM: PORTABLE CHEST 1 VIEW COMPARISON:  Chest radiograph from one day prior. FINDINGS: Tracheostomy tube tip overlies the tracheal air column at the thoracic inlet. Right PICC terminates over the cavoatrial junction. Left internal jugular central venous catheter terminates in upper third of the SVC. Enteric tube enters stomach with the tip not seen on this image. Stable right neck ECMO catheter coursing into the IVC. No pneumothorax. Complete opacification of the bilateral hemithoraces, worsened, with stable central air bronchograms bilaterally.  IMPRESSION: 1. Support structures as detailed. No pneumothorax. 2. Complete opacification of the bilateral hemithoraces, worsened, with stable central air bronchograms bilaterally, compatible with ARDS/pneumonia/atelectasis. Electronically Signed   By: Ilona Sorrel M.D.   On: 09/16/2020 11:49   DG CHEST PORT 1 VIEW  Result Date: 09/15/2020 CLINICAL DATA:  COVID positive.  ECMO. EXAM: PORTABLE CHEST 1 VIEW COMPARISON:  09/14/2020 FINDINGS: Tracheostomy tube again noted. A feeding tube passes into the stomach although the distal tip position is not included on the film. Left IJ central line tip overlies the innominate vein confluence. ECMO cannula again noted. Diffuse bilateral airspace disease is similar to prior. Telemetry leads overlie the chest. IMPRESSION: 1. No substantial interval change in exam. 2. Diffuse  bilateral airspace disease. Electronically Signed   By: Misty Stanley M.D.   On: 09/15/2020 08:13   DG CHEST PORT 1 VIEW  Result Date: 09/14/2020 CLINICAL DATA:  COVID-19 positivity on ECMO with acute respiratory distress EXAM: PORTABLE CHEST 1 VIEW COMPARISON:  09/14/2020 FINDINGS: Cardiac shadow is stable. Tracheostomy tube is now seen in satisfactory position. Feeding catheter and left jugular central line are again noted. ECMO cannula is seen in satisfactory position. Diffuse airspace opacity is noted in both lungs with air bronchograms similar to that seen on the prior exam. Bilateral pleural effusions are noted as well. No bony abnormality is seen. IMPRESSION: New tracheostomy tube in place. The remainder of the exam is stable from the prior study. Electronically Signed   By: Inez Catalina M.D.   On: 09/14/2020 12:35   DG CHEST PORT 1 VIEW  Result Date: 09/14/2020 CLINICAL DATA:  COVID-19 positive. EXAM: PORTABLE CHEST 1 VIEW COMPARISON:  September 13, 2020. FINDINGS: Endotracheal tube and feeding tube are in good position. ECMO device is unchanged. Left internal jugular catheter is  unchanged. Stable diffuse bilateral lung opacities are noted consistent with pneumonia. No pneumothorax is noted. Bony thorax is unremarkable. IMPRESSION: Stable support apparatus. Stable diffuse bilateral lung opacities are noted consistent with pneumonia. Electronically Signed   By: Marijo Conception M.D.   On: 09/14/2020 08:04   DG CHEST PORT 1 VIEW  Result Date: 09/13/2020 CLINICAL DATA:  COVID-19 positive. EXAM: PORTABLE CHEST 1 VIEW COMPARISON:  September 12, 2020. FINDINGS: Stable position of endotracheal and feeding tubes. Left internal jugular catheter is unchanged. ECMO is unchanged. Stable bilateral diffuse lung opacities are noted consistent with pneumonia. No pneumothorax is noted. Bony thorax is unremarkable. IMPRESSION: Stable support apparatus. Stable bilateral diffuse lung opacities are noted consistent with pneumonia. Electronically Signed   By: Marijo Conception M.D.   On: 09/13/2020 08:11   DG Abd Portable 1V  Result Date: 10/04/2020 CLINICAL DATA:  Nasogastric tube placement EXAM: PORTABLE ABDOMEN - 1 VIEW COMPARISON:  09/30/2020 FINDINGS: Nasogastric tube with the tip projecting over the expected location of the proximal jejunum. No bowel dilatation to suggest obstruction. No evidence of pneumoperitoneum, portal venous gas or pneumatosis. No pathologic calcifications along the expected course of the ureters. No acute osseous abnormality. IMPRESSION: Nasogastric tube with the tip projecting over the expected location of the proximal jejunum. Electronically Signed   By: Kathreen Devoid   On: 10/04/2020 11:14   DG Abd Portable 1V  Result Date: 09/30/2020 CLINICAL DATA:  Gastric tube placement EXAM: PORTABLE ABDOMEN - 1 VIEW COMPARISON:  Chest x-ray obtained earlier today FINDINGS: The tip of the gastric tube is in good position overlying the gastric antrum. Incompletely imaged ECMO tube. No significant gaseous dilation. IMPRESSION: Well-positioned gastric tube. Electronically Signed   By: Jacqulynn Cadet M.D.   On: 09/30/2020 10:07   DG Abd Portable 1V  Result Date: 09/27/2020 CLINICAL DATA:  Ileus EXAM: PORTABLE ABDOMEN - 1 VIEW COMPARISON:  September 06, 2020 FINDINGS: Feeding tube in the small bowel at the level of ligament of Treitz. ECMO cannula in place projects over the T12 level entering via superior approach, off the field of the radiograph. Stable position compared to prior CT evaluation. Diffuse pulmonary opacities with similar appearance, partially imaged. Scattered gas-filled loops of small bowel without signs of significant dilation. Gas and stool throughout the colon. Small amount of gas in the mid rectum. No acute skeletal process to the extent evaluated. IMPRESSION: 1. Feeding  tube in the small bowel at the level of the ligament of Treitz. 2. Scattered gas-filled loops of small bowel without signs of significant dilation, may reflect mild ileus. 3. ECMO cannula passing below the RIGHT hemidiaphragm tip at the upper margin of T12 as on prior CT. 4. Diffuse interstitial and airspace opacities at the lung bases as on the prior study. Electronically Signed   By: Zetta Bills M.D.   On: 09/27/2020 11:22   VAS [place] UPPER EXTREMITY VENOUS DUPLEX  Result Date: 09/21/2020 UPPER VENOUS STUDY  Indications: Swelling, and Line in right arm Limitations: Line, bandages and trach collar. Comparison Study: No prior study Performing Technologist: Sharion Dove RVS  Examination Guidelines: A complete evaluation includes B-mode imaging, spectral Doppler, color Doppler, and power Doppler as needed of all accessible portions of each vessel. Bilateral testing is considered an integral part of a complete examination. Limited examinations for reoccurring indications may be performed as noted.  Right Findings: +----------+------------+---------+-----------+----------+---------------------+ RIGHT     CompressiblePhasicitySpontaneousProperties       Summary         +----------+------------+---------+-----------+----------+---------------------+ IJV                                                    Not visualized     +----------+------------+---------+-----------+----------+---------------------+ Subclavian    Full       Yes       Yes                                    +----------+------------+---------+-----------+----------+---------------------+ Axillary      Full       Yes       Yes                                    +----------+------------+---------+-----------+----------+---------------------+ Brachial      Full       Yes       Yes              not all segment well                                                           visualized       +----------+------------+---------+-----------+----------+---------------------+ Radial        Full                                                        +----------+------------+---------+-----------+----------+---------------------+ Ulnar                                                patent by color and  Doppler        +----------+------------+---------+-----------+----------+---------------------+ Cephalic      Full                                                        +----------+------------+---------+-----------+----------+---------------------+ Basilic       Full                                                        +----------+------------+---------+-----------+----------+---------------------+  Left Findings: +----------+------------+---------+-----------+----------+-------+ LEFT      CompressiblePhasicitySpontaneousPropertiesSummary +----------+------------+---------+-----------+----------+-------+ Subclavian    Full       Yes       Yes                      +----------+------------+---------+-----------+----------+-------+  Summary:  Right: No evidence of deep vein thrombosis in  the upper extremity. No evidence of superficial vein thrombosis in the upper extremity.  Left: No evidence of thrombosis in the subclavian.  *See table(s) above for measurements and observations.  Diagnosing physician: Monica Martinez MD Electronically signed by Monica Martinez MD on 09/21/2020 at 7:56:36 PM.    Final    [place] EKG SITE RITE  Result Date: 09/22/2020 If Site Rite image not attached, placement could not be confirmed due to current cardiac rhythm.  [place] EKG SITE RITE  Result Date: 09/15/2020 If Site Rite image not attached, placement could not be confirmed due to current cardiac rhythm.   Microbiology Recent Results (from the past 240 hour(s))  Culture, Urine     Status: Abnormal   Collection Time: 10/03/20 12:27 PM   Specimen: Urine, Random  Result Value Ref Range Status   Specimen Description URINE, RANDOM  Final   Special Requests   Final    NONE Performed at [facility], [address], [place], [place] [address]    Culture 10,000 COLONIES/mL YEAST (A)  Final   Report Status 10/04/2020 FINAL  Final  Culture, respiratory (non-expectorated)     Status: None   Collection Time: 10/04/20  4:50 PM   Specimen: Bronchoalveolar Lavage; Respiratory  Result Value Ref Range Status   Specimen Description BRONCHIAL ALVEOLAR LAVAGE  Final   Special Requests NONE  Final   Gram Stain   Final    ABUNDANT WBC PRESENT,BOTH PMN AND MONONUCLEAR FEW GRAM POSITIVE COCCI Performed at [facility], [place] [address], [place], [place] [address]    Culture FEW METHICILLIN RESISTANT STAPHYLOCOCCUS AUREUS  Final   Report Status 10/07/2020 FINAL  Final   Organism ID, Bacteria METHICILLIN RESISTANT STAPHYLOCOCCUS AUREUS  Final      Susceptibility   Methicillin resistant staphylococcus aureus - MIC*    CIPROFLOXACIN >=8 RESISTANT Resistant     ERYTHROMYCIN >=8 RESISTANT Resistant     GENTAMICIN <=0.5 SENSITIVE Sensitive     OXACILLIN >=4 RESISTANT Resistant     TETRACYCLINE <=1  SENSITIVE Sensitive     VANCOMYCIN <=0.5 SENSITIVE Sensitive     TRIMETH/SULFA <=10 SENSITIVE Sensitive     CLINDAMYCIN <=0.25 SENSITIVE Sensitive     RIFAMPIN <=0.5 SENSITIVE Sensitive     Inducible Clindamycin NEGATIVE Sensitive     * FEW  METHICILLIN RESISTANT STAPHYLOCOCCUS AUREUS  MRSA PCR Screening     Status: Abnormal   Collection Time: 10/05/20  8:46 AM   Specimen: Nasopharyngeal  Result Value Ref Range Status   MRSA by PCR POSITIVE (A) NEGATIVE Final    Comment:        The GeneXpert MRSA Assay (FDA approved for NASAL specimens only), is one component of a comprehensive MRSA colonization surveillance program. It is not intended to diagnose MRSA infection nor to guide or monitor treatment for MRSA infections. RESULT CALLED TO, READ BACK BY AND VERIFIED WITH: Doris 11:05 10/05/20 ([email or web address]) Performed at [facility], [place] [address], [place], [place] [address]     Lab Basic Metabolic Panel: Recent Labs  Lab 10/08/20 [id number] 10/08/20 0519 10/09/20 0414 10/09/20 1135 10/10/20 0413 10/10/20 0414 10/10/20 1618 10/10/20 1724 10/11/20 0413 10/11/20 0821 10/11/20 1217 10/11/20 1612 10/11/20 1613 10/11/20 2015 10-24-2020 0338 October 24, 2020 0339 10-24-20 0730 10-24-2020 1135  NA 151*   < > 153*   < > 155*   < > 156*   < > 156*   < > 156* 158*   < > 159* 159* 156* 156* 158*  K 3.6   < > 4.0   < > 3.9   < > 4.3   < > 3.9   < > 4.3 4.2   < > 4.0 3.8 3.8 4.7 4.5  CL 114*   < > 116*   < > 116*  --  116*  --  117*  --  117* 118*  --   --   --  116*  --   --   CO2 28   < > 30   < > 29  --  31  --  31  --  29 30  --   --   --  32  --   --   GLUCOSE 162*   < > 161*   < > 109*  --  131*  --  164*  --  177* 131*  --   --   --  141*  --   --   BUN 83*   < > 89*   < > 92*  --  92*  --  88*  --  86* 85*  --   --   --  87*  --   --   CREATININE 1.09   < > 1.16   < > 1.14  --  1.14  --  1.13  --  1.12 1.08  --   --   --  1.19  --   --   CALCIUM 8.8*   < > 8.9   < > 9.2  --  9.0  --  8.8*   --  8.8* 8.9  --   --   --  8.7*  --   --   MG 2.7*  --  2.8*  --  2.8*  --   --   --  2.6*  --   --   --   --   --   --  2.5*  --   --   PHOS 2.7   < > 3.5   < > 3.8  --  3.6  --  3.9  --   --  4.0  --   --   --  4.1  --   --    < > = values in this interval not displayed.   Liver Function Tests: Recent Labs  Lab 10/10/20 0413 10/10/20 1618 10/11/20 0413 10/11/20 1612 October 22, 2020 0339  AST 41  --   --   --   --   ALT 42  --   --   --   --   ALKPHOS 201*  --   --   --   --   BILITOT 0.9  --   --   --   --   PROT 6.2*  --   --   --   --   ALBUMIN 2.4*  2.4* 2.4* 2.3* 2.4* 2.2*   No results for input(s): LIPASE, AMYLASE in the last 168 hours. No results for input(s): AMMONIA in the last 168 hours. CBC: Recent Labs  Lab 10/11/20 0413 10/11/20 0821 10/11/20 1217 10/11/20 1612 10/11/20 1613 Oct 22, 2020 0338 10/22/20 0339 2020-10-22 0729 Oct 22, 2020 0730 22-Oct-2020 1135  WBC 9.4  --  8.7 10.8*  --   --  6.3 9.0  --   --   HGB 7.8*   < > 7.7* 7.9*   < > 7.5* 10.7* 7.1* 6.5* 7.5*  HCT 25.4*   < > 26.0* 26.0*   < > 22.0* 35.0* 23.9* 19.0* 22.0*  MCV 100.4*  --  101.6* 100.8*  --   --  100.3* 102.6*  --   --   PLT 144*  --  134* 154  --   --  106* 129*  --   --    < > = values in this interval not displayed.   Cardiac Enzymes: No results for input(s): CKTOTAL, CKMB, CKMBINDEX, TROPONINI in the last 168 hours. Sepsis Labs: Recent Labs  Lab 10/10/20 1618 10/10/20 2103 10/11/20 0413 10/11/20 1217 10/11/20 1612 10-22-20 0339 10/22/2020 0729  WBC 8.3   < > 9.4 8.7 10.8* 6.3 9.0  LATICACIDVEN 1.0  --  0.9  --  0.6 0.9  --    < > = values in this interval not displayed.    Procedures/Operations   12/7: intubated/cvc/aline 12/8 ecmo cannulation plex therapies,  Trach Bronch See chart for complete details.   Audria Nine 10/22/2020, 1:34 PM

## 2020-10-31 NOTE — Progress Notes (Signed)
This chaplain phoned the Pt. wife-[name] for F/U spiritual care and learned of the Pt. turn of events.  [name] asked the chaplain to join her in the Pt. room.  The chaplain was bedside with [facility] and later with [name] at the Pt. time of death. Spiritual care with the family consisted of listening to the Pt. sermons which has meaning for the Pt. family and saying virtual good-byes. A sacred time of scripture and prayer was shared with the family.    The chaplain checked in with the healthcare team and family again at 2:35pm.  The family is continuing to visit in the room.

## 2020-10-31 NOTE — Procedures (Signed)
Extracorporeal support note     ECLS cannulation: 10/01/2020 Last circuit change: 09/11/2020  Indication: COVID ARDS   Configuration: VV, RIJ 32Fr [place]   Pump speed: 4500 Pump flow: 5.2 L Pump used: Cardiohelp   Sweep gas: 100%, 9 LPM   Circuit check: minimal clot Anticoagulant: bivalirudin   Changes in support: family is in discussion re: length of continued ecmo support. Pt had challenging night. Bleeding from trach and drop in hgb. Worsening acidosis req increase in sweep again. Oxygen poor despite max flows. Calling in family.   anticipated goals/duration of support: Bridge to recovery, day 36 of support.  Briant Sites, DO 10/18/2020 12:24 PM  Pulmonary & Critical Care

## 2020-10-31 NOTE — Progress Notes (Signed)
NAME:  Donald Rowe, MRN:  [id number], DOB:  03-18-1961, LOS: 28 ADMISSION DATE:  09/09/2020, CONSULTATION DATE:  12/6 REFERRING MD:  Dr. Aileen Fass, CHIEF COMPLAINT:  SOB    Brief History   60 y/o M admitted 12/4 with 1 week hx of SOB, known COVID positive.  He is unvaccinated.  CTA chest negative for PE but demonstrated diffuse bilateral infiltrates. [place] started on 12/8   Past Medical History  Hypothyroidism  [facility] Events/Procedures  12/04 Admit  12/06 PCCM consulted  12/07 Intubated, central line, a line 12/08 VV ECMO Cannulaton, cortrak 12/13 circuit change for hemolytic anemia 12/16 Tracheostomy  12/17 bronch w/ BAL 1/5 mrsa Consults:  PCCM, Heart failure, TCTS, ECMO  Significant Diagnostic Tests:   CTA Chest 12/4 >> extensive bilateral airspace disease, no large PE identified, limited study   LE Venous Duplex 12/4 >> negative for DVT bilaterally   LE venous duplex 12/8> BLE DVTs  RUE venous duplex 12/23  RUE arterial duplex 12/23  CT head 12/26 : Minimal SAH on left side  CT abdomen and pelvis 12/26: Extensive bilateral airspace disease with small pleural effusion  CT head 12/27: Subarachnoid unchanged  Chest x-ray 1/1: Significant improvement in airspace disease  Micro Data:  COVID 12/4 >> negative  Influenza A/B 12/4 >> negative  MRSA PCR 12/5 >> negative  BCx2 12/4 >> NG Trach aspirate 12/12> rare MRSA BAL 12/17> MRSA, candida Aspergillus Ag BAL 12/17> 0.05 12/18 sars2: positive 1/5 vanc->after 10 days  Antimicrobials:  Azithromycin 12/5 >> 12/6  Ceftriaxone 12/5 >> 12/6  Cefepime 12/7>12/14 vanc 12/14>12/20 Meropenem 12/17>12/21 Vanc1/5->  Interim history/subjective:  1/13: worsening overnight with bleeding from trach, decreased oxygenation despite max support on circuit. Called in gwen to come be with pt as he has clearly deteriorated. Spoke with tiffany via phone. Feel as though his body is declaring himself at this  time as we spoke about yesterday. Family appropriately emotional 1/12: sweep 7L today. Vent remains stable. Peg held after family meeting, will do if begins to make more improvement. Pt remains profoundly weak.  1/11: remains on 9L sweep. Vent minimal. Family meeting with Dr Haroldine Laws 1/10: increased sweep overnight. Remains on vanc will set end dated for total of 10 days.   Objective   Blood pressure (!) 170/58, pulse (!) 103, temperature 98.6 F (37 C), resp. rate 10, height _0  (1.753 m), weight 100.3 kg, SpO2 (!) 85 %.    Vent Mode: PCV FiO2 (%):  [40 %-50 %] 50 % Set Rate:  [10 bmp] 10 bmp PEEP:  [10 cmH20] 10 cmH20   Intake/Output Summary (Last 24 hours) at Nov 09, 2020 1227 Last data filed at 11-09-2020 1006 Gross per 24 hour  Intake 5462.13 ml  Output 5000 ml  Net 462.13 ml   Filed Weights   10/10/20 0500 10/11/20 0500 09-Nov-2020 0500  Weight: 100.6 kg 101.2 kg 100.3 kg    Examination: Gen: critically ill appearing man laying in bed in NAD, appears older than stated age. Ashen in color and increased wob HEENT: [place]/AT, eyes anicteric, eomi, perrla but not tracking Neck: trach w bleeding thru it but not arround CV: Tachycardic, regular rhythm Pulm: Bilateral rales tidal volumes around 250 cc. tachypneic Abd: soft, NT, mildly distended Extm: no cyanosis + edema, compression stockings in place Skin: warm & dry, pallor. No rashes. Neuro: RASS -2, remains not following commands or too weak to.   CXR personally reviewed> persistent bilateral airspace disease,    Resolved  Hospital Problem list     Assessment & Plan:   Acute hypoxemic and hypercarbic respiratory failure secondary to COVID PNA with severe ARDS.  S/p VV ECMO cannulation 09/21/2020.  S/p tracheostomy 12/16  Community-acquired pneumonia with MRSA- completed treatment. Recurrent LLL HAP-staph aureus -Completed remdesivir, solumedrol, and baricitnib. -Con't full ECMO support, unable to consistently wean  sweep -Completed treatment with 4 sessions of Plex for hemolysis -cont daily lasix for euvolemia goal -Completed 7 days vanc for MRSA pneumonia in early december.  -resumed vanc for repeated MRSA pneumonia.   Agitated delirium requiring titration of IV sedation.  Trivial left-sided subarachnoid hemorrhage   -Periods of severe agitation are limiting ability to wean ECMO -cont on precedex, and needs more sedation 2/2 wob and restlessness -Con't enteral agents  -worsening today, suspect he has less than 24 hours to live -on 9L sweep with need to cont to increase -oxygen sats cont to fall -cont titration  Bilateral lower ext DVTs- provoked by COVID infection -Continue bivalirudin; goal PTT 50-70 -needs 3 months AC for provoked DVT   Prediabetes- remains controlled  -a1c 6 -SSI PRN -goal BG 140-180  Hypothyroidism -con't PTA synthroid   Labile hypertension, contributed by periods of agitation.  Hypotension after meds.  -maintain spaced out sedating meds and antihypertensives to avoid hypotension and oversedation -con't clonidine, amlodipine, and metoprolol -agitation control helps moderate BP -on precedex alone for continuous sedation but decreased dosing  Hemolytic anemia - improved. More expected down trending of Hb with expected losses associated with ECMO Throbocytopenia Hyperbilirubinemia Rheumatologic-associated MAHA remains in differential.  hgb and plt are stable -Completed treatment with the Plex for 4 sessions -Transfuse if hgb <7.5. Recheck later.  Vomiting- resolved -con't reglan -resuming tf -holding peg placement at this time   SAH- suspect hypertensive in etiology> resolved on follow up imaging -PTT goals 50-70  Hypernatremia -fwf held in light of planned peg but now that is held will resume -recheck bmp this afternoon -con't to monitor  Deconditioning -Long road of rehab ahead if able to tolerate. Pt's agitation and delirium are the limiting factors  to improving strength. Need for sedation with cannulation but inability to wean circuit for decannuylation 2/2 delirium and overall weakness.  -suspect pt is actively dying despite our efforts. Family Investment banker, corporate (evaluated daily)  Diet: TF and fwf Pain/Anxiety/Delirium protocol (if indicated): PAD protocol, limit IV meds VAP protocol (if indicated): yes DVT prophylaxis: Bivalirudin GI prophylaxis: pantoprazole BID Glucose control: SSI Mobility: As tolerated Last date of multidisciplinary goals of care discussion: 10/10/20 with wife and daughter, RN, ECMO specialist, Dr Haroldine Laws Summary of discussion con't aggressive care for now, family discussions ongoing Follow up goals of care discussion due: 1/17 Code Status: DNR  Disposition: ICU  Critical care time: The patient is critically ill with multiple organ systems failure and requires high complexity decision making for assessment and support, frequent evaluation and titration of therapies, application of advanced monitoring technologies and extensive interpretation of multiple databases.  Critical care time 48mns. This represents my time independent of the NPs time taking care of the pt. This is excluding procedures.    JTavernierPulmonary and Critical Care 12022/01/25 12:27 PM

## 2020-10-31 NOTE — Progress Notes (Signed)
OT Cancellation Note  Patient Details Name: Donald Rowe MRN: [id number] DOB: 1961-02-23   Cancelled Treatment:    Reason Eval/Treat Not Completed: Medical issues which prohibited therapy.  Spoke with nursing, O2 sat 83-84%.  Will continue efforts as appropriate.    Hillari Zumwalt D Zuri Lascala October 22, 2020, 11:54 AM

## 2020-10-31 NOTE — Progress Notes (Signed)
Patient ID: Donald Rowe, male   DOB: September 10, 1961, 60 y.o.   MRN: [id number]    Advanced Heart Failure Rounding Note   Subjective:    12/8 Cannulated for VV ECMO - 32 FR  RIJ [place] 12/9 Extubated/Reintubated for altered mental status 12/13 Circuit change for elevated LDH 12/15 PLEX started -> completed 12/22 12/16 Tracheostomy 12/23 head CT (normal) for change in mental status.  12/26 Pan CT for drop in hgb. Small SAH. Severe lung disease 12/27 CVVH begun 1/1 CVVH stopped 1/3 Foley placed for urinary retention 1/5  CT head and chest - resolution of SAH. Severe bilateral airspace disease, slightly improved. Pneumomediastinum resolved.   Having some bleeding from ETT  Worked with PT yesterday but was noted to be "very floppy"   Stools remain dark. Hgb continues to drop. 7.1   CXR with severe diffuse infiltrates no significant change. Personally reviewed  Remains very agitated   On sweep 7 but more acidotic    ECMO   Speed 4000 Flow 5.5 L Sweep 7 dP 28 pVen -95  ABG: 7.35/63/89/89% Hgb 7.1 LDH 434 -> 488 -> 514 -> 508 -> 542 -> 541 -> 556 -> 538 Lactic acid 0.9 PTT 58  Objective:   Weight Range:  Vital Signs:   Temp:  [98.6 F (37 C)-98.8 F (37.1 C)] 98.6 F (37 C) (01/13 0400) Pulse Rate:  [101-132] 105 (01/13 0754) Resp:  [15-52] 41 (01/13 0754) BP: (127-171)/(41-65) 170/58 (01/13 0754) SpO2:  [90 %-99 %] 91 % (01/13 0754) Arterial Line BP: (107-186)/(43-76) 186/66 (01/13 0600) FiO2 (%):  [40 %-50 %] 50 % (01/13 0754) Weight:  [100.3 kg] 100.3 kg (01/13 0500) Last BM Date: 10/11/20  Weight change: Filed Weights   10/10/20 0500 10/11/20 0500 10/24/2020 0500  Weight: 100.6 kg 101.2 kg 100.3 kg    Intake/Output:   Intake/Output Summary (Last 24 hours) at 10-24-20 0803 Last data filed at 10/24/20 0700 Gross per 24 hour  Intake 5434.64 ml  Output 5925 ml  Net -490.36 ml     Physical Exam: General:  Awake. Agitated will not follow  commands HEENT: normal Neck: supple. + RIJ ECMO + trach with blood in it. Carotids 2+ bilat; no bruits. No lymphadenopathy or thryomegaly appreciated. Cor: PMI nondisplaced. Regular tachy Lungs: coarse Abdomen: soft, nontender, nondistended. No hepatosplenomegaly. No bruits or masses. Good bowel sounds. Extremities: no cyanosis, clubbing, rash, edema Neuro: awake agitated   Telemetry: sinus 100-115 Personally reviewed   Labs: Basic Metabolic Panel: Recent Labs  Lab 10/08/20 0438 10/08/20 0519 10/09/20 0414 10/09/20 1135 10/10/20 0413 10/10/20 0414 10/10/20 1618 10/10/20 1724 10/11/20 0413 10/11/20 5643 10/11/20 1217 10/11/20 1612 10/11/20 1613 10/11/20 2015 10-24-20 0338 2020-10-24 0339 10/24/20 0730  NA 151*   < > 153*   < > 155*   < > 156*   < > 156*   < > 156* 158* 144 159* 159* 156* 156*  K 3.6   < > 4.0   < > 3.9   < > 4.3   < > 3.9   < > 4.3 4.2 4.2 4.0 3.8 3.8 4.7  CL 114*   < > 116*   < > 116*  --  116*  --  117*  --  117* 118*  --   --   --  116*  --   CO2 28   < > 30   < > 29  --  31  --  31  --  29 30  --   --   --  32  --   GLUCOSE 162*   < > 161*   < > 109*  --  131*  --  164*  --  177* 131*  --   --   --  141*  --   BUN 83*   < > 89*   < > 92*  --  92*  --  88*  --  86* 85*  --   --   --  87*  --   CREATININE 1.09   < > 1.16   < > 1.14  --  1.14  --  1.13  --  1.12 1.08  --   --   --  1.19  --   CALCIUM 8.8*   < > 8.9   < > 9.2  --  9.0  --  8.8*  --  8.8* 8.9  --   --   --  8.7*  --   MG 2.7*  --  2.8*  --  2.8*  --   --   --  2.6*  --   --   --   --   --   --  2.5*  --   PHOS 2.7   < > 3.5   < > 3.8  --  3.6  --  3.9  --   --  4.0  --   --   --  4.1  --    < > = values in this interval not displayed.    Liver Function Tests: Recent Labs  Lab 10/10/20 0413 10/10/20 1618 10/11/20 0413 10/11/20 1612 2020/10/15 0339  AST 41  --   --   --   --   ALT 42  --   --   --   --   ALKPHOS 201*  --   --   --   --   BILITOT 0.9  --   --   --   --   PROT 6.2*   --   --   --   --   ALBUMIN 2.4*  2.4* 2.4* 2.3* 2.4* 2.2*   No results for input(s): LIPASE, AMYLASE in the last 168 hours. No results for input(s): AMMONIA in the last 168 hours.  CBC: Recent Labs  Lab 10/11/20 0413 10/11/20 0821 10/11/20 1217 10/11/20 1612 10/11/20 1613 10/11/20 2015 2020/10/15 0338 October 15, 2020 0339 Oct 15, 2020 0729 2020/10/15 0730  WBC 9.4  --  8.7 10.8*  --   --   --  6.3 9.0  --   HGB 7.8*   < > 7.7* 7.9*   < > 7.5* 7.5* 10.7* 7.1* 6.5*  HCT 25.4*   < > 26.0* 26.0*   < > 22.0* 22.0* 35.0* 23.9* 19.0*  MCV 100.4*  --  101.6* 100.8*  --   --   --  100.3* 102.6*  --   PLT 144*  --  134* 154  --   --   --  106* 129*  --    < > = values in this interval not displayed.    Cardiac Enzymes: No results for input(s): CKTOTAL, CKMB, CKMBINDEX, TROPONINI in the last 168 hours.  BNP: BNP (last 3 results) No results for input(s): BNP in the last 8760 hours.  ProBNP (last 3 results) No results for input(s): PROBNP in the last 8760 hours.    Other results:  Imaging: DG CHEST PORT 1 VIEW  Result Date: 2020/10/15 CLINICAL DATA:  60 year old male COVID-41.  ECMO. EXAM: PORTABLE CHEST 1 VIEW  COMPARISON:  Portable chest 10/11/2020 and earlier. FINDINGS: Portable AP semi upright view at 0559 hours. Stable tracheostomy, visible enteric feeding tube. Stable left PICC line, right ECMO cannula. Mildly improved lung volumes. Stable visible mediastinal contours. Diffuse coarse pulmonary opacity with mostly peripheral and basilar more confluent opacity and air bronchograms. Ventilation has mildly improved from 10/09/2020, not significantly changed from 10/10/2020. No pneumothorax or pleural effusion is evident. Paucity of upper abdominal bowel gas. IMPRESSION: 1. Stable lines and tubes. 2. Severe bilateral pneumonia with stable ventilation recently, remaining mildly improved from 10/09/2020. Electronically Signed   By: Genevie Ann M.D.   On: 2020-11-11 06:50   DG CHEST PORT 1  VIEW  Result Date: 10/11/2020 CLINICAL DATA:  60 year old male COVID-2.  ECMO. EXAM: PORTABLE CHEST 1 VIEW COMPARISON:  Portable chest 10/10/2020 and earlier. FINDINGS: Portable AP semi upright view at 0540 hours. Mildly rotated to the left today. Stable tracheostomy tube. Stable left PICC line, visible enteric feeding tube, right chest ECMO cannula. Stable low lung volumes. No pneumothorax or definite pleural effusion. Widespread bilateral coarse and confluent pulmonary opacity with perihilar air bronchograms. Ventilation has mildly improved since 10/09/2020, not significantly different from yesterday. Paucity of bowel gas in the upper abdomen. Stable visualized osseous structures. IMPRESSION: 1.  Stable lines and tubes. 2. Widespread bilateral pneumonia/ARDS with mildly improved ventilation in both lungs since 10/09/2020. Electronically Signed   By: Genevie Ann M.D.   On: 10/11/2020 06:55     Medications:     Scheduled Medications: . albuterol  2.5 mg Nebulization Q4H  . amLODipine  10 mg Per Tube q1800  . chlorhexidine gluconate (MEDLINE KIT)  15 mL Mouth Rinse BID  . Chlorhexidine Gluconate Cloth  6 each Topical Daily  . clonazePAM  0.5 mg Per Tube Q8H  . cloNIDine  0.1 mg Per Tube TID  . docusate  100 mg Per Tube BID  . feeding supplement (PROSource TF)  45 mL Per Tube QID  . fiber  1 packet Per Tube BID  . free water  200 mL Per Tube Q2H  . furosemide  40 mg Intravenous BID  . insulin aspart  2-6 Units Subcutaneous Q4H  . levothyroxine  50 mcg Per Tube Q0600  . lidocaine  1 patch Transdermal Q24H  . mouth rinse  15 mL Mouth Rinse 10 times per day  . metoCLOPramide (REGLAN) injection  5 mg Intravenous Q12H  . metoprolol tartrate  50 mg Per Tube Q12H  . nystatin  5 mL Per Tube QID  . oxyCODONE  7.5 mg Per Tube Q4H  . pantoprazole sodium  40 mg Per Tube BID  . polyethylene glycol  17 g Per Tube BID  . pravastatin  40 mg Per Tube q1800  . QUEtiapine  150 mg Per Tube TID  . sodium  chloride flush  10-40 mL Intracatheter Q12H  . sodium chloride HYPERTONIC  4 mL Nebulization Q4H    Infusions: . sodium chloride Stopped (10/10/20 1804)  . albumin human 12.5 g (09/29/20 1644)  . bivalirudin (ANGIOMAX) infusion 0.5 mg/mL (Non-ACS indications) 0.06 mg/kg/hr (11/11/2020 0654)  . dexmedetomidine (PRECEDEX) IV infusion 0.2 mcg/kg/hr (2020-11-11 0654)  . feeding supplement (PIVOT 1.5 CAL) 1,000 mL (11/11/20 0221)  . vancomycin Stopped (10/11/20 2137)    PRN Medications: acetaminophen (TYLENOL) oral liquid 160 mg/5 mL, albumin human, albuterol, diphenhydrAMINE, Gerhardt's butt cream, guaiFENesin-dextromethorphan, labetalol, lip balm, LORazepam, ondansetron **OR** ondansetron (ZOFRAN) IV, sodium chloride flush   Assessment/Plan:   1. Acute hypoxic respiratory failure/ARDS  in setting of COVID-19 PNA - Cannulated for VV ECMO on 12/8 after failing intubation x 2 days - has complete remedisivir, baricitinib, solumedrol.  - s/p trach 12/16 - CT chest 12/26 with severe lung disease and pneumomediastinum.  - 1/5  CT head and chest - resolution of SAH. Severe bilateral airspace disease, slightly improved. Pneumomediastinum resolved.  - Restarted vanc/cefepime 1/6 with Staph in trach aspirate. Getting chest PT Still with significant secretions. Now with blood in ETT concerning for possible pulmonary hemorrhage - Circuit changed 12/13 for elevated LDH and lower flows. PLEX started 12/15. S/p PLEX #4/4 on 12/21. LDH down today. Circuit working well - Sweep down to 7 but pCO2 rising and more acidotic and tachypneic. Looks more uncomfortable. Will increase Sweep - CVVH started 12/27 with intractable volume overload, stopped 1/1. Creatinine stable. Sodium high. Get some D5W yesterday. Sodium 156 today - Bivalirudin with PTT goal back up to 50-70. PTT 50 today Discussed dosing with PharmD personally. - Has not made any progress this week. We tried to wean sweep 9->7 but failed. Suspect not  enough respiratory muscle strength to compensate on his own. Doubt he will have enough respiratory reserve to get off ECMO. Now 36 days out. At most would go to 42 days (7 weeks) if not close to decannulation will need to consider withdrawal of support. Will d/w family - Hgb dropping steadily. Has evidence of slow ongoing GI bleeding and now possible pulmonary hemorrhage  2. Bilateral DVT - remains on bivalirudin - Dosing d/w PharmD goal PTT 50-70  3. DM2 - Insulin adjusted. CBGs improved   4. Obesity - Body mass index is 32.65 kg/m.  5. F/E/N - Ileus improved. Back on TFs - We had tentative plan for feeding tube with IR on 1/11 but deferred to give family more time to discuss next steps.  - no change  6. HTN - Remains labile. Continue to adjust  7. Rectal bleeding - follow closely. Rectal tube back in over the weekend for perfuse liquid stools. No bleeding currently  8. AKI - Now off CVVH, creatinine and volume status stable. Continue daily lasix  9. Acute encephalopathy - remains agitated  - head CT ok 09/07/20 and 12/21 - head CT 12/26. Small SAH.  Repeat CT on 12/27 with unchanged small SAH.   10. Small SAH - on CT 12/26 - repeat CT 12/27 unchanged small SAH.  - PTT goal 50-70. 53 today - avoid severe HTN as best as possible  11. Gastric ileus - Resolved  12. Anemia - Transfuse hgb < 7.5.  - hgb 7.1 will trasnfuse  13. Severe deconditioning/muscle weakness - PT has been limited due to agitation - Continue to try to mobilize as best as posible - No change - PT/OT to see again today  Plan discussed on multi-discplinary ECMO rounds with CCM, ECMO coordinator/specialist, PharmDs and RNs.  CRITICAL CARE Performed by: Glori Bickers  Total critical care time: 45 minutes  Critical care time was exclusive of separately billable procedures and treating other patients.  Critical care was necessary to treat or prevent imminent or life-threatening  deterioration.  Critical care was time spent personally by me (independent of midlevel providers or residents) on the following activities: development of treatment plan with patient and/or surrogate as well as nursing, discussions with consultants, evaluation of patient's response to treatment, examination of patient, obtaining history from patient or surrogate, ordering and performing treatments and interventions, ordering and review of laboratory studies, ordering and review of radiographic  studies, pulse oximetry and re-evaluation of patient's condition.    Length of Stay: 40   Glori Bickers MD 11-10-20, 8:03 AM  Advanced Heart Failure Team Pager [phone] (M-F; [place])  Please contact [facility] for night-coverage after hours (4p -7a ) and weekends on [email or web address]

## 2020-10-31 NOTE — Progress Notes (Signed)
Pt rapidly declining. ECMO turned off at 1213. Patient deceased at 22.

## 2020-10-31 NOTE — Progress Notes (Addendum)
Patient very agitated on initial assessment this morning. HR 130-140, SBP 180-190, RR 55-60, O2 sats 86-87. Patient's face red and he appears to be grimacing. RN administered scheduled oxycodone and seroquel and also titrated precedex drip. Patient repositioned and trach suctioned; some blood secretions noted. Dr. Gaynell Face aware. BP, HR, and RR slightly improved after receiving medications. O2 sats remain 86-87. Patient visually appears more comfortable. ECMO circuit flowing great per ECMO specialist. Will continue to closely monitor.  Leanna Battles, RN

## 2020-10-31 NOTE — Progress Notes (Signed)
OT Cancellation Note  Patient Details Name: Donald Rowe MRN: [id number] DOB: Feb 23, 1961   Cancelled Treatment:    Reason Eval/Treat Not Completed: Patient deceased, OT order completed.    Daven Pinckney D Rut [place]ton 10-19-20, 5:16 PM

## 2020-10-31 DEATH — deceased

## 2021-12-25 IMAGING — DX DG CHEST 1V PORT
1 series · 1 of 1 positions shown · non-contrast
Comparison: September 02, 2020

CLINICAL DATA: COVID positive.

EXAM:
PORTABLE CHEST 1 VIEW

[chest ap]
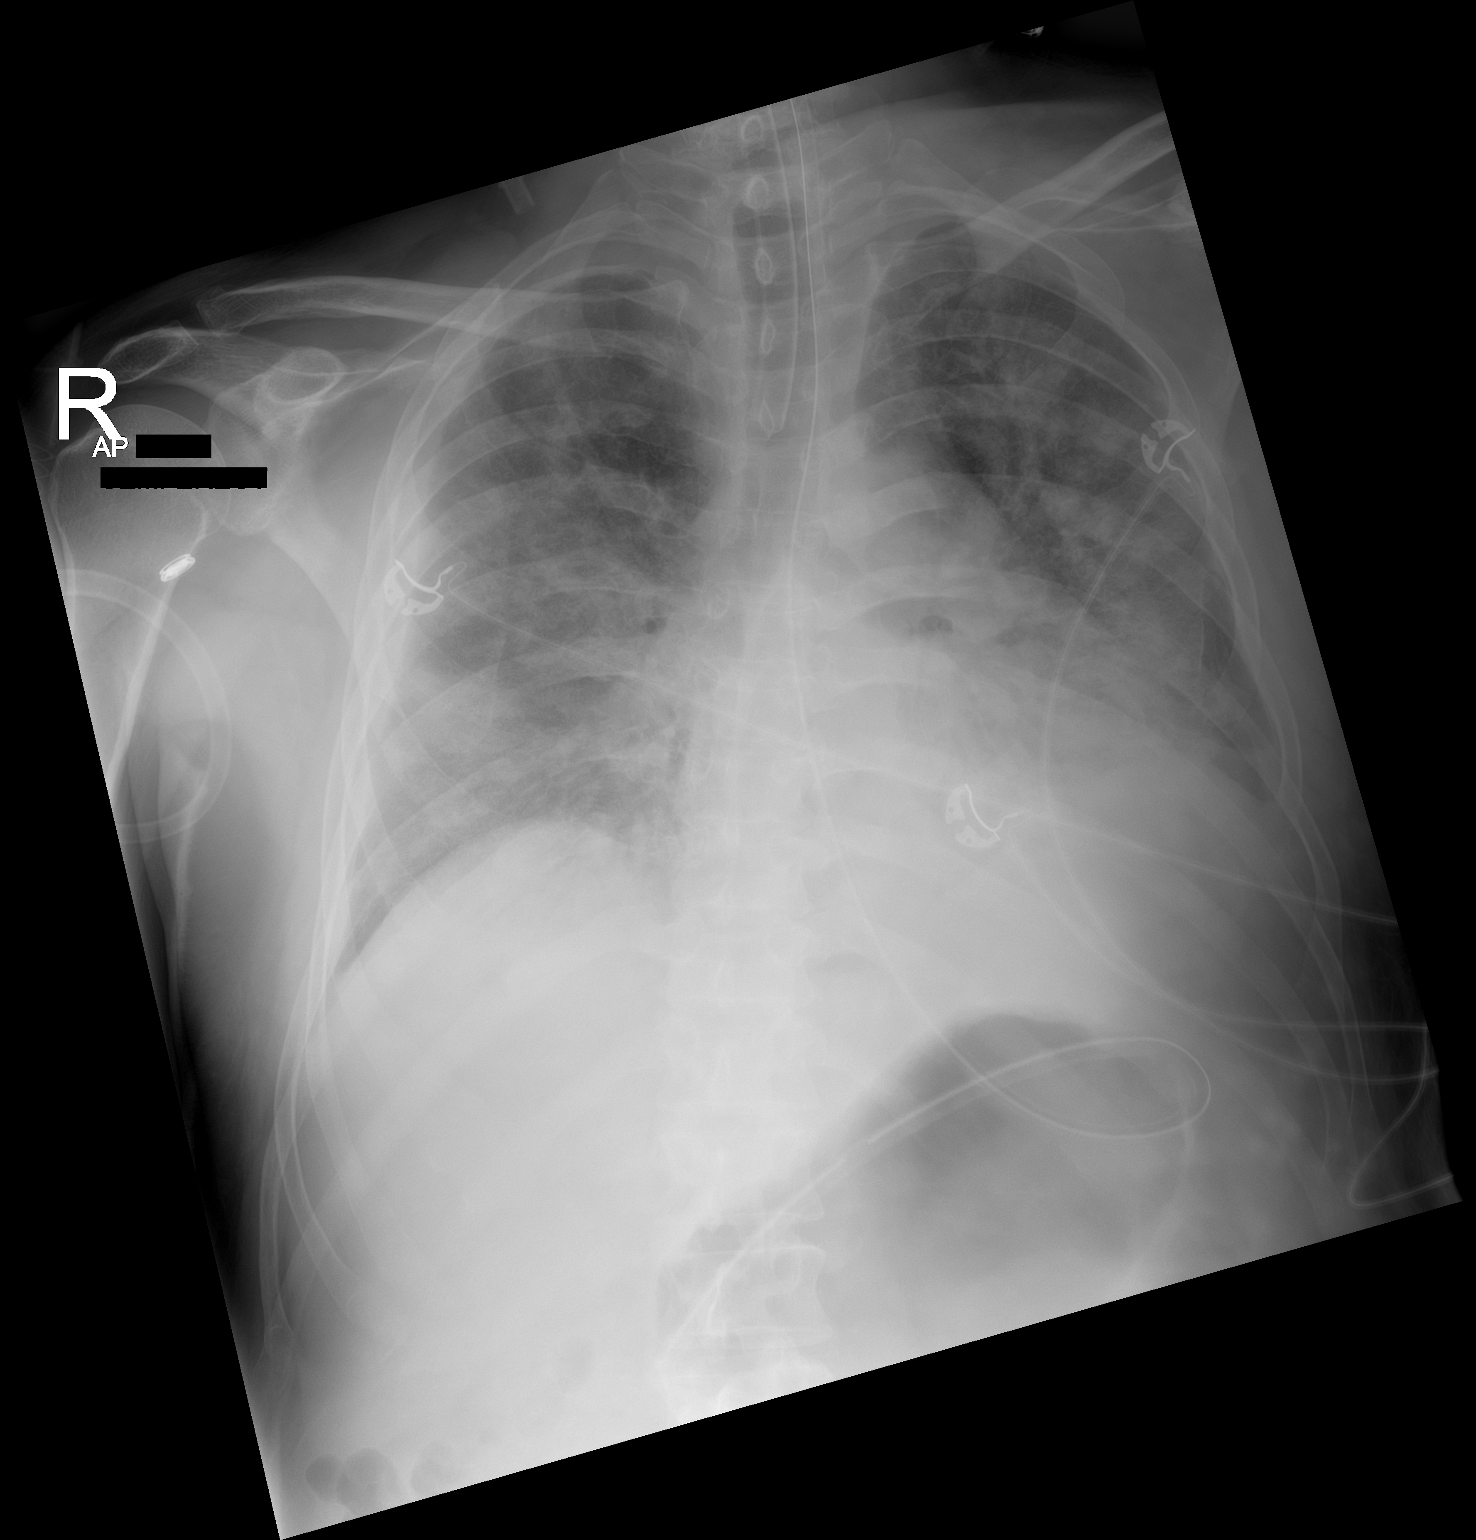

[1 of 1 positions shown; findings below may reference images not displayed]

FINDINGS: The endotracheal tube terminates above the carina the 0.8 cm. The
enteric tube extends below the left hemidiaphragm. There are diffuse
bilateral hazy airspace opacities which have substantially worsened
since the prior chest x-ray. There is no pneumothorax or large
pleural effusion. The heart size is stable.
IMPRESSION: 1. Lines and tubes as above. The endotracheal tube terminates above
the carina.
2. Worsening bilateral airspace opacities consistent with multifocal
pneumonia.

## 2021-12-26 IMAGING — DX DG CHEST 1V PORT
1 series · 1 of 1 positions shown · non-contrast
Comparison: 09/05/2020 at 4333 hours

CLINICAL DATA: Central line placement.  [id number] positive.

EXAM:
PORTABLE CHEST 1 VIEW

[chest ap]
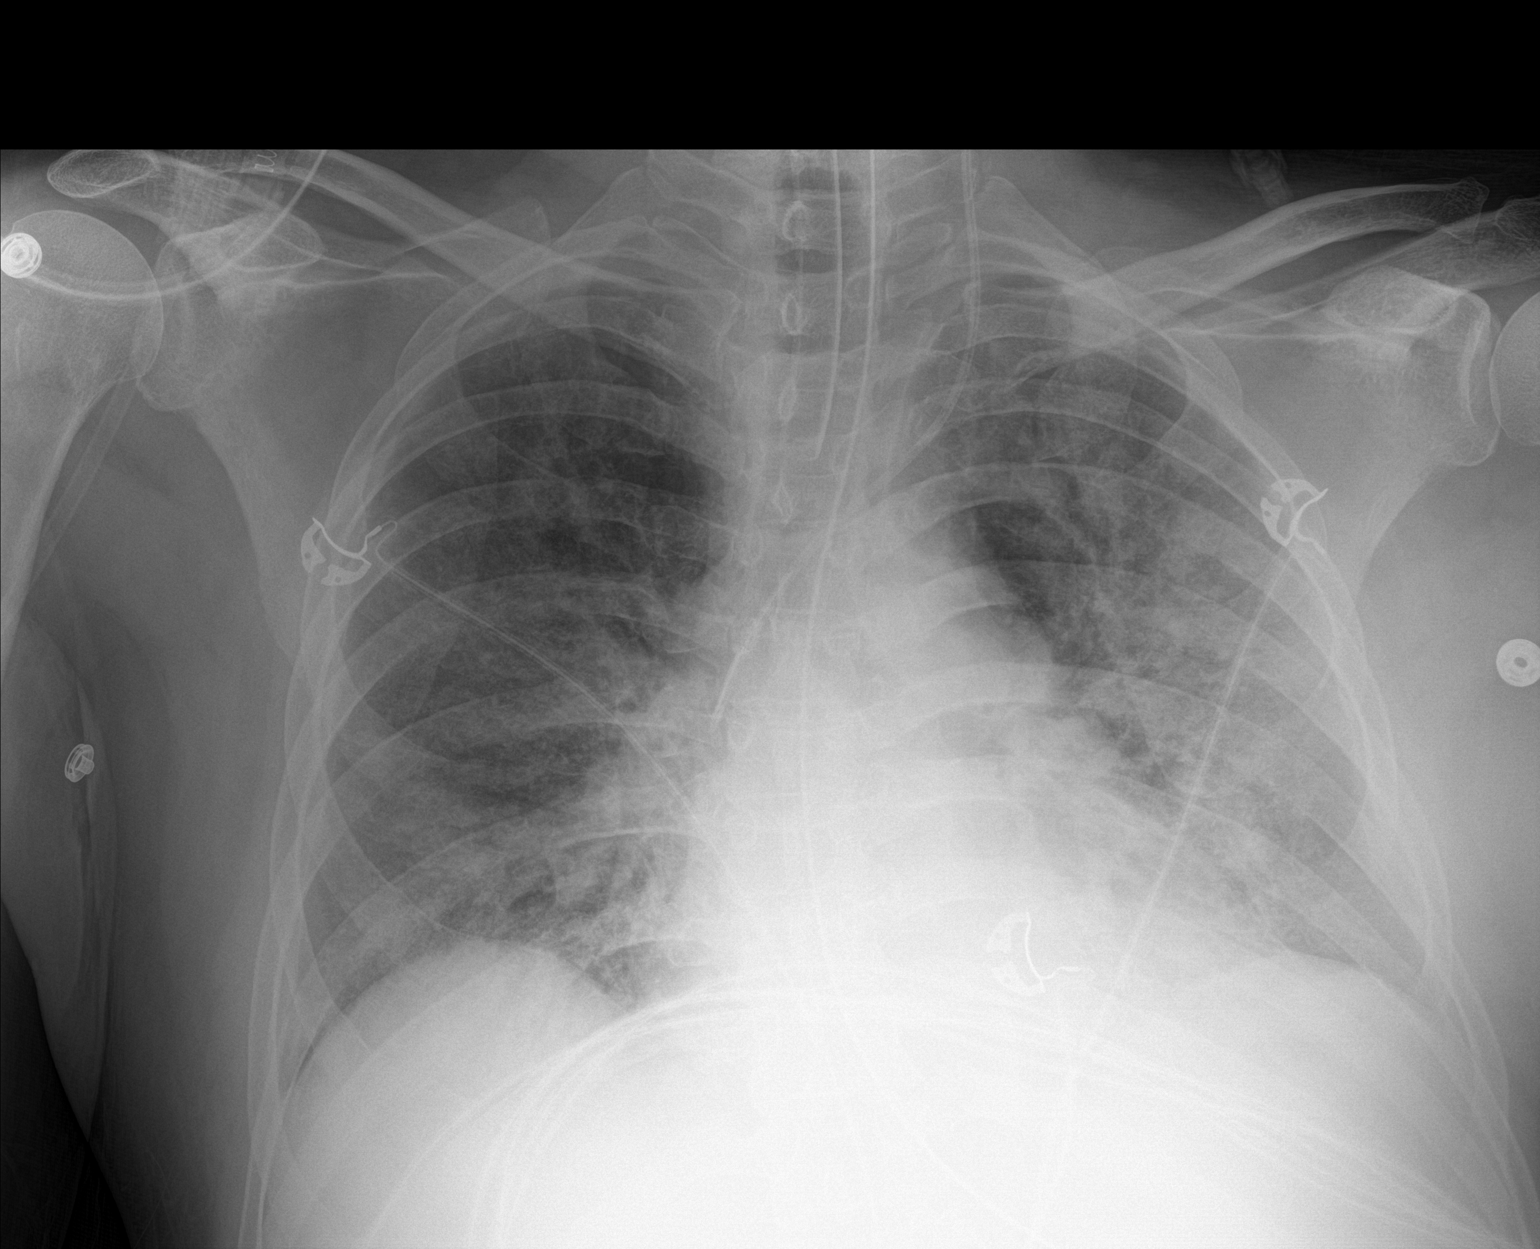

[1 of 1 positions shown; findings below may reference images not displayed]

FINDINGS: Endotracheal tube remains in place, terminating 4.3 cm above the
carina. Enteric tube courses into the abdomen with tip not imaged. A
new left jugular catheter terminates over the mid SVC. The cardiac
silhouette is mildly enlarged. Widespread interstitial and airspace
opacities are again seen bilaterally. There is overall improved lung
aeration bilaterally, particularly in the left lung base. No large
pleural effusion or pneumothorax is identified.
IMPRESSION: 1. Interval left jugular catheter placement as above.
2. Extensive bilateral pneumonia with overall mildly improved lung
aeration.

## 2021-12-26 IMAGING — DX DG CHEST 1V PORT
1 series · 1 of 1 positions shown · non-contrast
Comparison: Chest x-ray 09/04/2020.

CLINICAL DATA: ARDS.  COVID.

EXAM:
PORTABLE CHEST 1 VIEW

[chest ap]
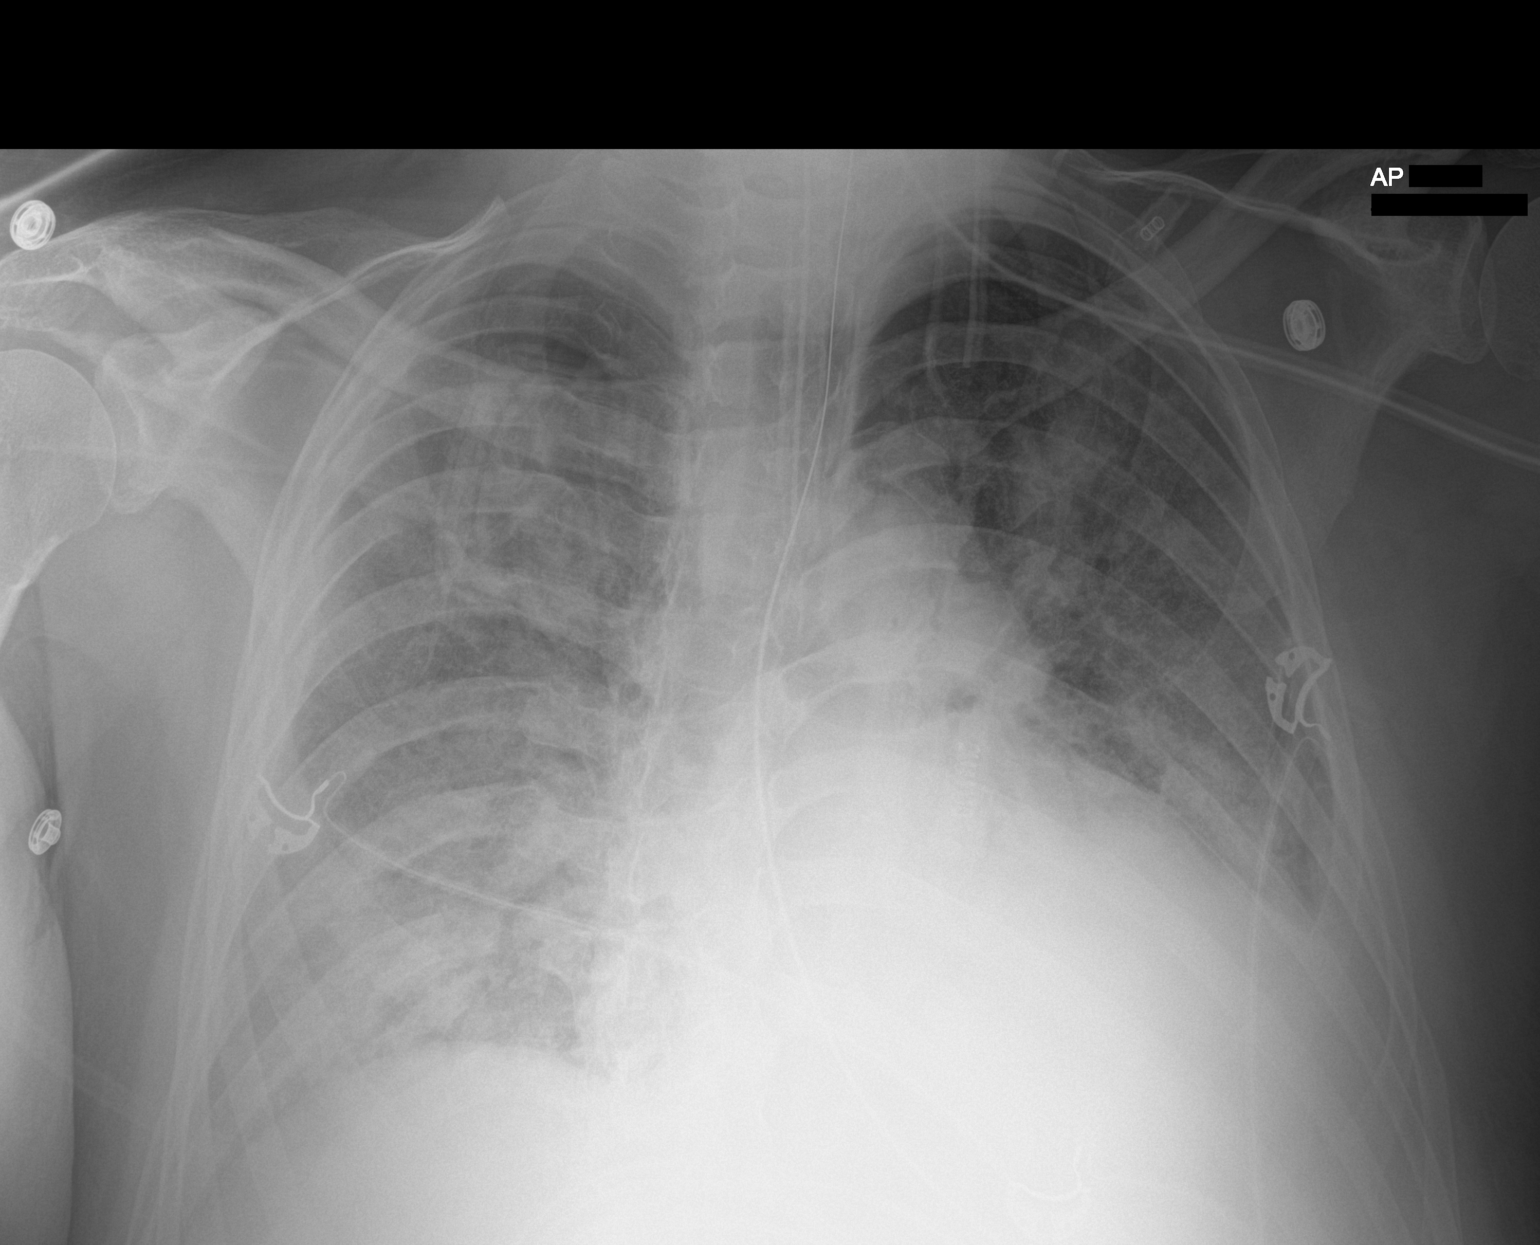

[1 of 1 positions shown; findings below may reference images not displayed]

FINDINGS: Endotracheal tube and NG tube in stable position. Stable
cardiomegaly. Diffuse unchanged severe bilateral pulmonary
infiltrates/edema. Persistent left-sided small to moderate pleural
effusion. No pneumothorax.
IMPRESSION: 1. Lines and tubes in stable position.
2. Stable cardiomegaly.
3. Diffuse unchanged severe bilateral pulmonary infiltrates/edema.
Persistent left-sided pleural effusion.

## 2021-12-27 IMAGING — DX DG CHEST 1V PORT
1 series · 1 of 1 positions shown · non-contrast
Comparison: September 05, 2020

CLINICAL DATA: Central line placement

EXAM:
PORTABLE CHEST 1 VIEW

[view not recorded]
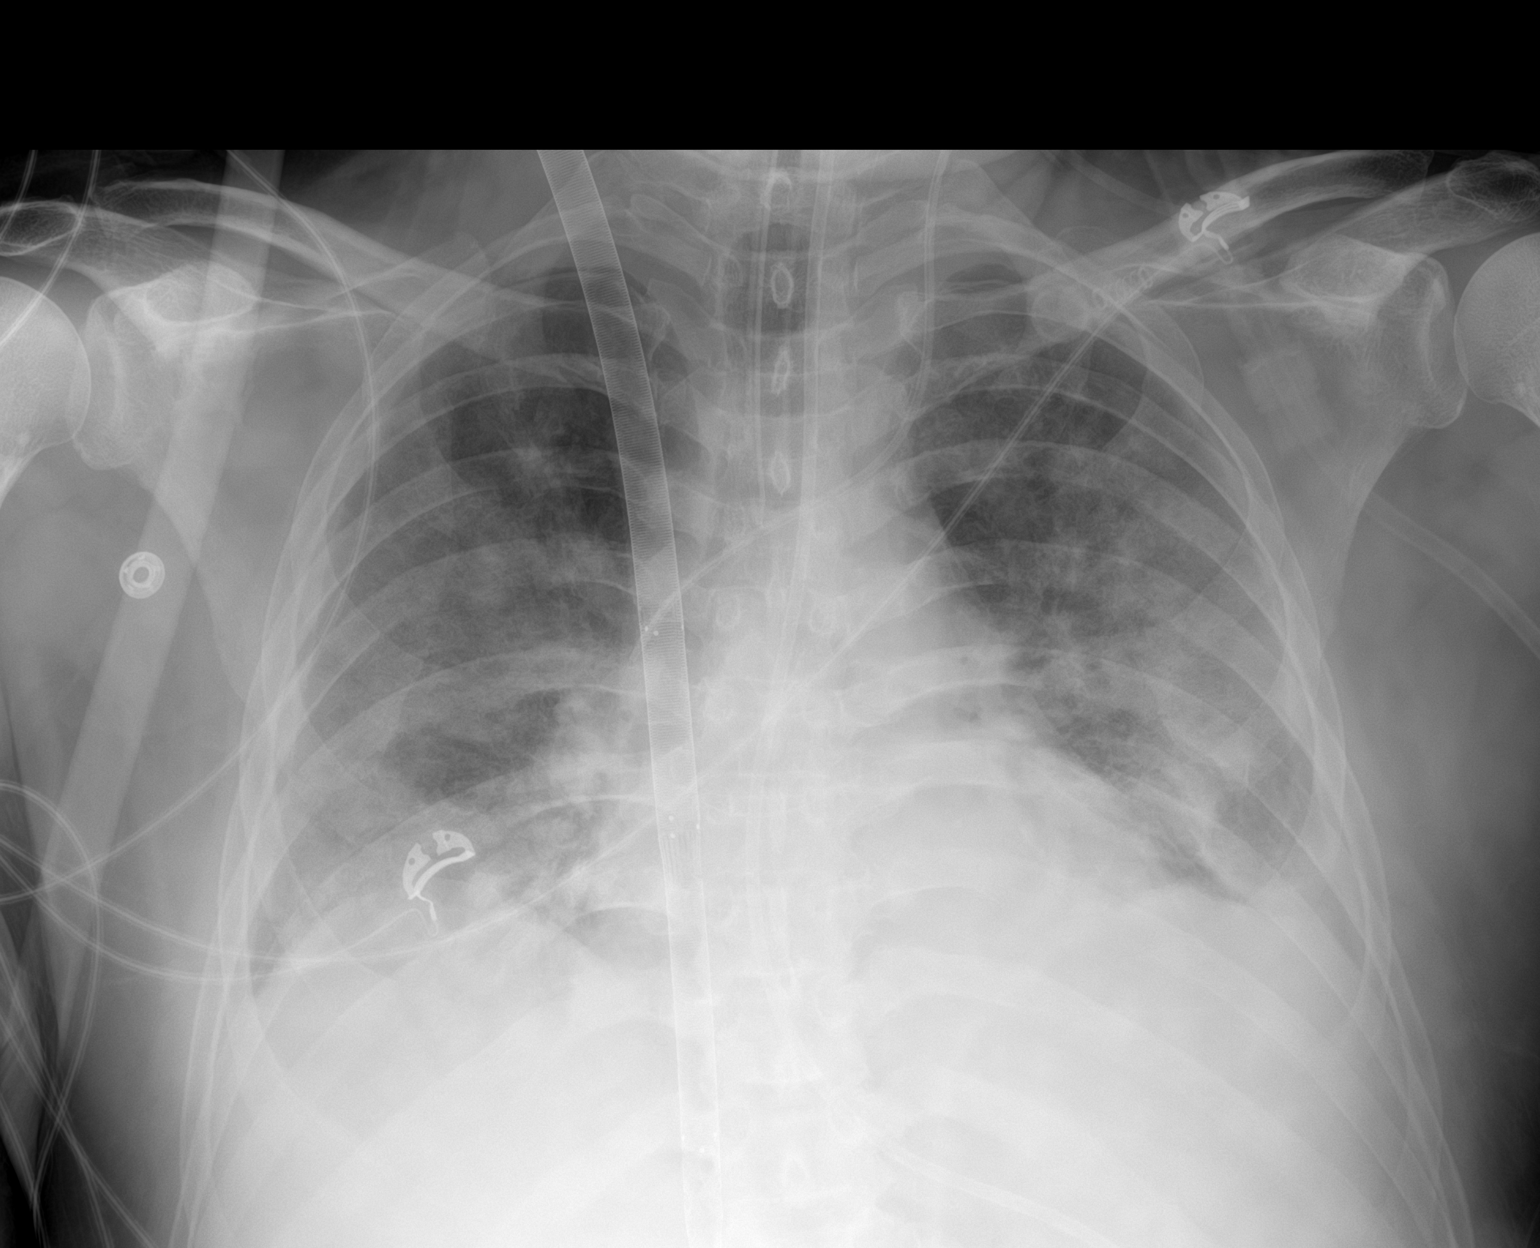

[1 of 1 positions shown; findings below may reference images not displayed]

FINDINGS: The heart size and mediastinal contours are within normal limits.
Again noted are multifocal patchy airspace opacities with slight
worsening within the right lung. A small left pleural effusion is
seen. ETT is 1.5 cm above the carina. A left-sided central venous
catheter seen with the tip at the mid SVC. NG tube is seen coursing
below the diaphragm.
IMPRESSION: Lines and tubes in unchanged position.

Slight interval worsening in the airspace opacities throughout the
right lung and stable opacities throughout the left lung.

Small left pleural effusion.

## 2021-12-28 IMAGING — DX DG CHEST 1V PORT
1 series · 1 of 1 positions shown · non-contrast
Comparison: 09/06/2020.

CLINICAL DATA: Intubation.  ECMO.  COVID positive.

EXAM:
PORTABLE CHEST 1 VIEW

[chest ap]
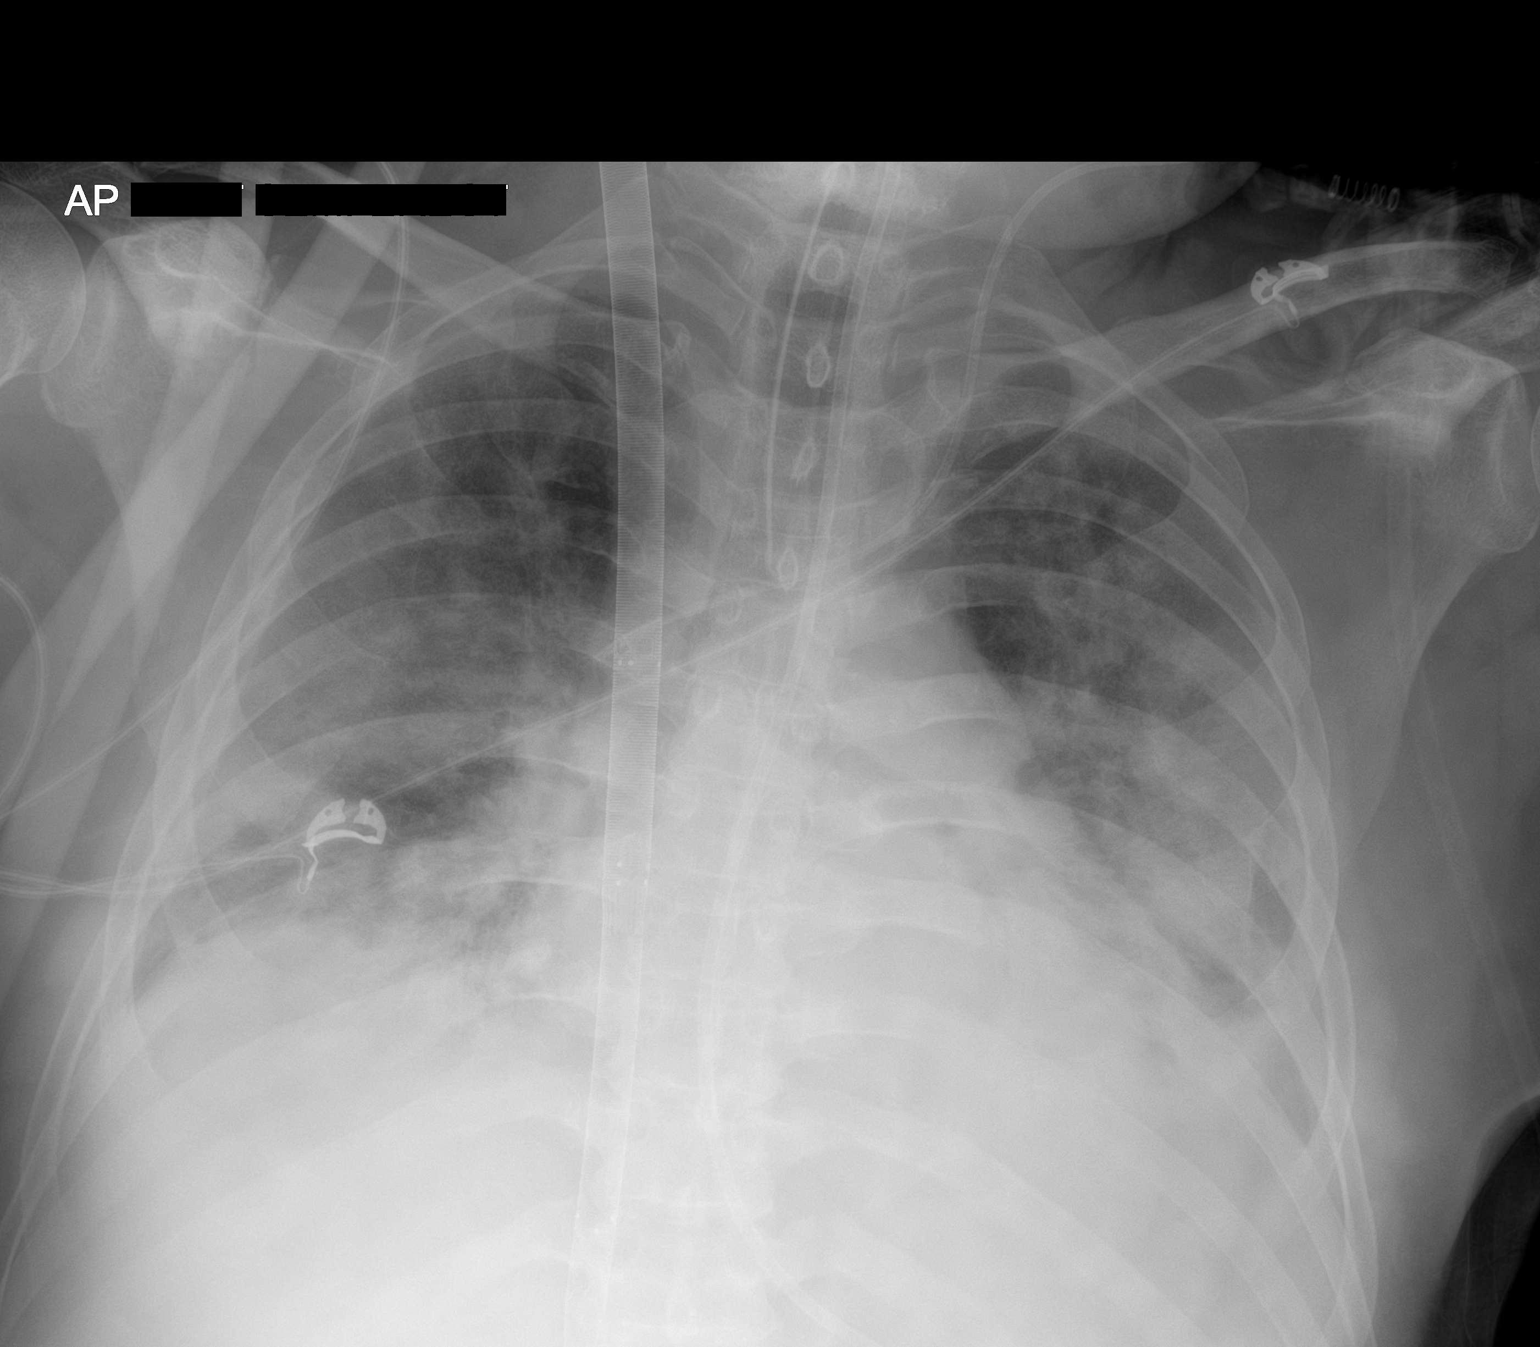

[1 of 1 positions shown; findings below may reference images not displayed]

FINDINGS: Endotracheal tube, feeding tube, left IJ line, ECMO device in stable
position. Cardiomegaly. Diffuse bilateral pulmonary
infiltrates/edema again noted without interim change. Low lung
volumes. Small left pleural effusion again noted. No pneumothorax.
IMPRESSION: 1. Lines and tubes including ECMO device in stable position.
2. Cardiomegaly with persistent bilateral pulmonary
infiltrates/edema and small left pleural effusion. No interim
change.
